# Patient Record
Sex: Female | Born: 1940 | Race: White | Hispanic: No | Marital: Married | Smoking: Never smoker
Health system: Southern US, Community
[De-identification: ages and names within clinical notes are randomized; demographics above are authoritative.]

## PROBLEM LIST (undated history)

## (undated) DIAGNOSIS — L509 Urticaria, unspecified: Secondary | ICD-10-CM

## (undated) DIAGNOSIS — I2510 Atherosclerotic heart disease of native coronary artery without angina pectoris: Secondary | ICD-10-CM

## (undated) DIAGNOSIS — E039 Hypothyroidism, unspecified: Secondary | ICD-10-CM

## (undated) DIAGNOSIS — G2581 Restless legs syndrome: Secondary | ICD-10-CM

## (undated) DIAGNOSIS — J849 Interstitial pulmonary disease, unspecified: Secondary | ICD-10-CM

## (undated) DIAGNOSIS — E785 Hyperlipidemia, unspecified: Secondary | ICD-10-CM

## (undated) DIAGNOSIS — K219 Gastro-esophageal reflux disease without esophagitis: Secondary | ICD-10-CM

## (undated) DIAGNOSIS — L661 Lichen planopilaris, unspecified: Secondary | ICD-10-CM

## (undated) DIAGNOSIS — R7303 Prediabetes: Secondary | ICD-10-CM

## (undated) DIAGNOSIS — I341 Nonrheumatic mitral (valve) prolapse: Secondary | ICD-10-CM

## (undated) DIAGNOSIS — M1990 Unspecified osteoarthritis, unspecified site: Secondary | ICD-10-CM

## (undated) DIAGNOSIS — K449 Diaphragmatic hernia without obstruction or gangrene: Secondary | ICD-10-CM

## (undated) DIAGNOSIS — I272 Pulmonary hypertension, unspecified: Secondary | ICD-10-CM

## (undated) HISTORY — DX: Interstitial pulmonary disease, unspecified: J84.9

## (undated) HISTORY — DX: Urticaria, unspecified: L50.9

## (undated) HISTORY — DX: Gastro-esophageal reflux disease without esophagitis: K21.9

## (undated) HISTORY — DX: Lichen planopilaris: L66.1

## (undated) HISTORY — DX: Lichen planopilaris, unspecified: L66.10

## (undated) HISTORY — DX: Nonrheumatic mitral (valve) prolapse: I34.1

## (undated) HISTORY — DX: Restless legs syndrome: G25.81

## (undated) HISTORY — PX: TOTAL ABDOMINAL HYSTERECTOMY: SHX209

## (undated) HISTORY — DX: Diaphragmatic hernia without obstruction or gangrene: K44.9

## (undated) HISTORY — DX: Hypothyroidism, unspecified: E03.9

## (undated) HISTORY — DX: Hyperlipidemia, unspecified: E78.5

## (undated) HISTORY — PX: KNEE ARTHROSCOPY: SUR90

## (undated) HISTORY — PX: APPENDECTOMY: SHX54

---

## 1987-01-23 HISTORY — PX: CARDIAC CATHETERIZATION: SHX172

## 1997-01-22 HISTORY — PX: FLEXIBLE SIGMOIDOSCOPY: SHX1649

## 1999-11-30 ENCOUNTER — Encounter: Payer: Self-pay | Admitting: Obstetrics and Gynecology

## 1999-11-30 ENCOUNTER — Encounter: Admission: RE | Admit: 1999-11-30 | Discharge: 1999-11-30 | Payer: Self-pay | Admitting: Obstetrics and Gynecology

## 1999-12-22 ENCOUNTER — Encounter: Admission: RE | Admit: 1999-12-22 | Discharge: 1999-12-22 | Payer: Self-pay | Admitting: Obstetrics and Gynecology

## 1999-12-22 ENCOUNTER — Encounter: Payer: Self-pay | Admitting: Obstetrics and Gynecology

## 2001-01-20 ENCOUNTER — Encounter: Admission: RE | Admit: 2001-01-20 | Discharge: 2001-01-20 | Payer: Self-pay | Admitting: Internal Medicine

## 2001-01-20 ENCOUNTER — Encounter: Payer: Self-pay | Admitting: Internal Medicine

## 2002-01-21 ENCOUNTER — Encounter: Payer: Self-pay | Admitting: Obstetrics and Gynecology

## 2002-01-21 ENCOUNTER — Encounter: Admission: RE | Admit: 2002-01-21 | Discharge: 2002-01-21 | Payer: Self-pay | Admitting: Obstetrics and Gynecology

## 2003-01-23 HISTORY — PX: COLONOSCOPY: SHX174

## 2003-05-04 ENCOUNTER — Ambulatory Visit (HOSPITAL_COMMUNITY): Admission: RE | Admit: 2003-05-04 | Discharge: 2003-05-04 | Payer: Self-pay | Admitting: Internal Medicine

## 2003-05-04 ENCOUNTER — Encounter: Payer: Self-pay | Admitting: Internal Medicine

## 2003-05-04 LAB — HM COLONOSCOPY

## 2003-12-02 ENCOUNTER — Ambulatory Visit (HOSPITAL_BASED_OUTPATIENT_CLINIC_OR_DEPARTMENT_OTHER): Admission: RE | Admit: 2003-12-02 | Discharge: 2003-12-02 | Payer: Self-pay | Admitting: Orthopedic Surgery

## 2004-05-01 ENCOUNTER — Ambulatory Visit: Payer: Self-pay | Admitting: Internal Medicine

## 2004-09-11 ENCOUNTER — Encounter: Admission: RE | Admit: 2004-09-11 | Discharge: 2004-09-11 | Payer: Self-pay | Admitting: Obstetrics and Gynecology

## 2004-09-19 ENCOUNTER — Ambulatory Visit: Payer: Self-pay | Admitting: Internal Medicine

## 2004-10-12 ENCOUNTER — Ambulatory Visit: Payer: Self-pay | Admitting: Internal Medicine

## 2004-10-31 ENCOUNTER — Ambulatory Visit: Payer: Self-pay | Admitting: Internal Medicine

## 2005-01-10 ENCOUNTER — Ambulatory Visit: Payer: Self-pay | Admitting: Internal Medicine

## 2005-05-29 ENCOUNTER — Ambulatory Visit: Payer: Self-pay | Admitting: Internal Medicine

## 2005-07-16 ENCOUNTER — Ambulatory Visit: Payer: Self-pay | Admitting: Internal Medicine

## 2006-01-08 ENCOUNTER — Ambulatory Visit: Payer: Self-pay | Admitting: Internal Medicine

## 2006-01-23 ENCOUNTER — Encounter: Admission: RE | Admit: 2006-01-23 | Discharge: 2006-01-23 | Payer: Self-pay | Admitting: Obstetrics and Gynecology

## 2006-04-10 ENCOUNTER — Ambulatory Visit: Payer: Self-pay | Admitting: Internal Medicine

## 2006-04-10 LAB — CONVERTED CEMR LAB
ALT: 15 units/L (ref 0–40)
Glucose, Bld: 92 mg/dL (ref 70–99)
Triglycerides: 102 mg/dL (ref 0–149)
VLDL: 20 mg/dL (ref 0–40)

## 2006-07-02 ENCOUNTER — Encounter: Payer: Self-pay | Admitting: Internal Medicine

## 2006-07-02 ENCOUNTER — Encounter: Admission: RE | Admit: 2006-07-02 | Discharge: 2006-07-02 | Payer: Self-pay | Admitting: Obstetrics and Gynecology

## 2006-07-10 ENCOUNTER — Ambulatory Visit: Payer: Self-pay | Admitting: Internal Medicine

## 2007-01-21 ENCOUNTER — Ambulatory Visit: Payer: Self-pay | Admitting: Cardiology

## 2007-01-21 ENCOUNTER — Telehealth: Payer: Self-pay | Admitting: Internal Medicine

## 2007-01-21 ENCOUNTER — Observation Stay (HOSPITAL_COMMUNITY): Admission: EM | Admit: 2007-01-21 | Discharge: 2007-01-22 | Payer: Self-pay | Admitting: Emergency Medicine

## 2007-01-29 ENCOUNTER — Telehealth (INDEPENDENT_AMBULATORY_CARE_PROVIDER_SITE_OTHER): Payer: Self-pay | Admitting: *Deleted

## 2007-01-29 ENCOUNTER — Encounter: Payer: Self-pay | Admitting: Internal Medicine

## 2007-01-30 ENCOUNTER — Ambulatory Visit: Payer: Self-pay

## 2007-01-30 ENCOUNTER — Encounter: Payer: Self-pay | Admitting: Internal Medicine

## 2007-01-31 ENCOUNTER — Telehealth (INDEPENDENT_AMBULATORY_CARE_PROVIDER_SITE_OTHER): Payer: Self-pay | Admitting: *Deleted

## 2007-02-03 ENCOUNTER — Telehealth (INDEPENDENT_AMBULATORY_CARE_PROVIDER_SITE_OTHER): Payer: Self-pay | Admitting: *Deleted

## 2007-02-07 ENCOUNTER — Ambulatory Visit: Payer: Self-pay | Admitting: Internal Medicine

## 2007-02-07 DIAGNOSIS — E039 Hypothyroidism, unspecified: Secondary | ICD-10-CM | POA: Insufficient documentation

## 2007-02-07 DIAGNOSIS — E782 Mixed hyperlipidemia: Secondary | ICD-10-CM | POA: Insufficient documentation

## 2007-02-12 ENCOUNTER — Ambulatory Visit: Payer: Self-pay

## 2007-02-12 ENCOUNTER — Encounter: Admission: RE | Admit: 2007-02-12 | Discharge: 2007-02-12 | Payer: Self-pay | Admitting: Obstetrics and Gynecology

## 2007-02-17 ENCOUNTER — Ambulatory Visit: Payer: Self-pay | Admitting: Internal Medicine

## 2007-02-17 ENCOUNTER — Encounter (INDEPENDENT_AMBULATORY_CARE_PROVIDER_SITE_OTHER): Payer: Self-pay | Admitting: *Deleted

## 2007-06-19 ENCOUNTER — Ambulatory Visit: Payer: Self-pay | Admitting: Internal Medicine

## 2007-06-19 DIAGNOSIS — J309 Allergic rhinitis, unspecified: Secondary | ICD-10-CM | POA: Insufficient documentation

## 2007-06-19 DIAGNOSIS — K219 Gastro-esophageal reflux disease without esophagitis: Secondary | ICD-10-CM | POA: Insufficient documentation

## 2007-06-20 ENCOUNTER — Encounter (INDEPENDENT_AMBULATORY_CARE_PROVIDER_SITE_OTHER): Payer: Self-pay | Admitting: *Deleted

## 2007-06-22 LAB — CONVERTED CEMR LAB
Albumin: 4.1 g/dL (ref 3.5–5.2)
Alkaline Phosphatase: 55 units/L (ref 39–117)
Bilirubin, Direct: 0.1 mg/dL (ref 0.0–0.3)
Cholesterol: 141 mg/dL (ref 0–200)
Lymphocytes Relative: 31.8 % (ref 12.0–46.0)
Monocytes Absolute: 0.6 10*3/uL (ref 0.1–1.0)
Neutro Abs: 3.5 10*3/uL (ref 1.4–7.7)
Platelets: 210 10*3/uL (ref 150–400)
RBC: 4.52 M/uL (ref 3.87–5.11)
TSH: 0.28 microintl units/mL — ABNORMAL LOW (ref 0.35–5.50)
Total Bilirubin: 0.9 mg/dL (ref 0.3–1.2)
Total Protein: 7 g/dL (ref 6.0–8.3)
Triglycerides: 109 mg/dL (ref 0–149)
VLDL: 22 mg/dL (ref 0–40)

## 2007-06-23 ENCOUNTER — Encounter (INDEPENDENT_AMBULATORY_CARE_PROVIDER_SITE_OTHER): Payer: Self-pay | Admitting: *Deleted

## 2007-07-10 ENCOUNTER — Ambulatory Visit: Payer: Self-pay | Admitting: Internal Medicine

## 2007-07-10 LAB — CONVERTED CEMR LAB: OCCULT 2: NEGATIVE

## 2007-07-11 ENCOUNTER — Encounter (INDEPENDENT_AMBULATORY_CARE_PROVIDER_SITE_OTHER): Payer: Self-pay | Admitting: *Deleted

## 2007-07-31 DIAGNOSIS — K449 Diaphragmatic hernia without obstruction or gangrene: Secondary | ICD-10-CM | POA: Insufficient documentation

## 2007-07-31 DIAGNOSIS — Z8679 Personal history of other diseases of the circulatory system: Secondary | ICD-10-CM | POA: Insufficient documentation

## 2007-08-04 ENCOUNTER — Ambulatory Visit: Payer: Self-pay | Admitting: Internal Medicine

## 2007-08-15 ENCOUNTER — Encounter: Payer: Self-pay | Admitting: Internal Medicine

## 2007-08-15 ENCOUNTER — Ambulatory Visit: Payer: Self-pay | Admitting: Internal Medicine

## 2007-08-19 ENCOUNTER — Encounter: Payer: Self-pay | Admitting: Internal Medicine

## 2007-10-09 ENCOUNTER — Telehealth (INDEPENDENT_AMBULATORY_CARE_PROVIDER_SITE_OTHER): Payer: Self-pay | Admitting: *Deleted

## 2007-11-07 ENCOUNTER — Ambulatory Visit: Payer: Self-pay | Admitting: Internal Medicine

## 2007-11-15 LAB — CONVERTED CEMR LAB: TSH: 0.38 microintl units/mL (ref 0.35–5.50)

## 2007-11-18 ENCOUNTER — Encounter (INDEPENDENT_AMBULATORY_CARE_PROVIDER_SITE_OTHER): Payer: Self-pay | Admitting: *Deleted

## 2007-11-19 ENCOUNTER — Telehealth (INDEPENDENT_AMBULATORY_CARE_PROVIDER_SITE_OTHER): Payer: Self-pay | Admitting: *Deleted

## 2008-03-02 ENCOUNTER — Encounter: Admission: RE | Admit: 2008-03-02 | Discharge: 2008-03-02 | Payer: Self-pay | Admitting: Obstetrics and Gynecology

## 2008-03-04 ENCOUNTER — Encounter: Admission: RE | Admit: 2008-03-04 | Discharge: 2008-03-04 | Payer: Self-pay | Admitting: Obstetrics and Gynecology

## 2008-03-26 ENCOUNTER — Ambulatory Visit: Payer: Self-pay | Admitting: Internal Medicine

## 2008-03-29 ENCOUNTER — Encounter (INDEPENDENT_AMBULATORY_CARE_PROVIDER_SITE_OTHER): Payer: Self-pay | Admitting: *Deleted

## 2008-07-08 ENCOUNTER — Ambulatory Visit: Payer: Self-pay | Admitting: Family Medicine

## 2008-07-08 LAB — CONVERTED CEMR LAB
Bilirubin Urine: NEGATIVE
Ketones, urine, test strip: NEGATIVE
Nitrite: NEGATIVE
Urobilinogen, UA: NEGATIVE
WBC Urine, dipstick: NEGATIVE

## 2008-07-10 ENCOUNTER — Telehealth: Payer: Self-pay | Admitting: Family Medicine

## 2008-09-01 ENCOUNTER — Ambulatory Visit: Payer: Self-pay | Admitting: Internal Medicine

## 2008-09-01 DIAGNOSIS — M2550 Pain in unspecified joint: Secondary | ICD-10-CM | POA: Insufficient documentation

## 2008-09-01 DIAGNOSIS — N959 Unspecified menopausal and perimenopausal disorder: Secondary | ICD-10-CM | POA: Insufficient documentation

## 2008-09-02 ENCOUNTER — Telehealth (INDEPENDENT_AMBULATORY_CARE_PROVIDER_SITE_OTHER): Payer: Self-pay | Admitting: *Deleted

## 2008-09-03 ENCOUNTER — Encounter (INDEPENDENT_AMBULATORY_CARE_PROVIDER_SITE_OTHER): Payer: Self-pay | Admitting: *Deleted

## 2008-09-15 ENCOUNTER — Encounter: Payer: Self-pay | Admitting: Internal Medicine

## 2008-09-15 ENCOUNTER — Encounter: Admission: RE | Admit: 2008-09-15 | Discharge: 2008-09-15 | Payer: Self-pay | Admitting: Internal Medicine

## 2008-09-17 ENCOUNTER — Encounter (INDEPENDENT_AMBULATORY_CARE_PROVIDER_SITE_OTHER): Payer: Self-pay | Admitting: *Deleted

## 2008-10-20 ENCOUNTER — Ambulatory Visit: Payer: Self-pay | Admitting: Internal Medicine

## 2008-10-24 LAB — CONVERTED CEMR LAB
Basophils Absolute: 0 10*3/uL (ref 0.0–0.1)
Eosinophils Absolute: 0.3 10*3/uL (ref 0.0–0.7)
Eosinophils Relative: 5.5 % — ABNORMAL HIGH (ref 0.0–5.0)
Folate: 15.7 ng/mL
Iron: 56 ug/dL (ref 42–145)
Lymphs Abs: 1.6 10*3/uL (ref 0.7–4.0)
MCHC: 34 g/dL (ref 30.0–36.0)
MCV: 90.9 fL (ref 78.0–100.0)
Monocytes Relative: 9.4 % (ref 3.0–12.0)
Neutrophils Relative %: 53.1 % (ref 43.0–77.0)
Saturation Ratios: 17.3 % — ABNORMAL LOW (ref 20.0–50.0)
Transferrin: 230.7 mg/dL (ref 212.0–360.0)
Vitamin B-12: 334 pg/mL (ref 211–911)
WBC: 5.1 10*3/uL (ref 4.5–10.5)

## 2008-10-25 ENCOUNTER — Encounter (INDEPENDENT_AMBULATORY_CARE_PROVIDER_SITE_OTHER): Payer: Self-pay | Admitting: *Deleted

## 2009-03-08 ENCOUNTER — Encounter: Admission: RE | Admit: 2009-03-08 | Discharge: 2009-03-08 | Payer: Self-pay | Admitting: Obstetrics and Gynecology

## 2009-05-10 ENCOUNTER — Telehealth (INDEPENDENT_AMBULATORY_CARE_PROVIDER_SITE_OTHER): Payer: Self-pay | Admitting: *Deleted

## 2009-11-02 ENCOUNTER — Ambulatory Visit: Payer: Self-pay | Admitting: Internal Medicine

## 2009-11-04 ENCOUNTER — Encounter: Payer: Self-pay | Admitting: Internal Medicine

## 2009-11-07 LAB — CONVERTED CEMR LAB
ALT: 15 units/L (ref 0–35)
Alkaline Phosphatase: 61 units/L (ref 39–117)
BUN: 14 mg/dL (ref 6–23)
Basophils Relative: 0.5 % (ref 0.0–3.0)
CO2: 29 meq/L (ref 19–32)
Chloride: 106 meq/L (ref 96–112)
Creatinine, Ser: 0.8 mg/dL (ref 0.4–1.2)
Hemoglobin: 12.3 g/dL (ref 12.0–15.0)
Lymphocytes Relative: 29.7 % (ref 12.0–46.0)
MCHC: 33.5 g/dL (ref 30.0–36.0)
Monocytes Absolute: 0.6 10*3/uL (ref 0.1–1.0)
Potassium: 4.7 meq/L (ref 3.5–5.1)
Total CHOL/HDL Ratio: 3
Total Protein: 6.7 g/dL (ref 6.0–8.3)
Triglycerides: 126 mg/dL (ref 0.0–149.0)
VLDL: 25.2 mg/dL (ref 0.0–40.0)

## 2010-02-11 ENCOUNTER — Other Ambulatory Visit: Payer: Self-pay | Admitting: Obstetrics and Gynecology

## 2010-02-11 DIAGNOSIS — Z1239 Encounter for other screening for malignant neoplasm of breast: Secondary | ICD-10-CM

## 2010-02-12 ENCOUNTER — Encounter: Payer: Self-pay | Admitting: Obstetrics and Gynecology

## 2010-02-19 LAB — CONVERTED CEMR LAB
BUN: 16 mg/dL (ref 6–23)
Creatinine, Ser: 0.7 mg/dL (ref 0.4–1.2)
Eosinophils Absolute: 0.7 10*3/uL (ref 0.0–0.7)
GFR calc non Af Amer: 88.62 mL/min (ref >60)
Glucose, Bld: 86 mg/dL (ref 70–99)
LDL Cholesterol: 72 mg/dL (ref 0–99)
MCV: 90.3 fL (ref 78.0–100.0)
Monocytes Relative: 9.3 % (ref 3.0–12.0)
Neutrophils Relative %: 48.9 % (ref 43.0–77.0)
RDW: 13.3 % (ref 11.5–14.6)
Total Bilirubin: 1 mg/dL (ref 0.3–1.2)
Total CHOL/HDL Ratio: 3
Total Protein: 6.7 g/dL (ref 6.0–8.3)
Triglycerides: 82 mg/dL (ref 0.0–149.0)
VLDL: 16.4 mg/dL (ref 0.0–40.0)
Vit D, 25-Hydroxy: 34 ng/mL (ref 30–89)
WBC: 6.9 10*3/uL (ref 4.5–10.5)

## 2010-02-21 NOTE — Progress Notes (Signed)
Summary: Refill Meds   Phone Note Refill Request Message from:  Patient  Refills Requested: Medication #1:  NEXIUM 40 MG  CPDR 1 qd [facility] [address]   Method Requested: Fax to [facility] Initial call taken by: Georgette Dover,  May 10, 2009 12:01 PM    Prescriptions: NEXIUM 40 MG  CPDR (ESOMEPRAZOLE MAGNESIUM) 1 qd  #90 x 2   Entered by:   Georgette Dover   Authorized by:   Unice Cobble MD   Signed by:   Georgette Dover on 05/10/2009   Method used:   Electronically to        [facility]  [phone]* (retail)       [place], [place]  [address]       Ph: [phone] or [phone]       Fax: [phone]   RxID:   [id number]

## 2010-02-21 NOTE — Assessment & Plan Note (Signed)
Summary: MED REFILL/KN   Vital Signs:  Patient profile:   70 year old female Height:      65.75 inches (167.01 cm) Weight:      146.38 pounds (66.54 kg) BMI:     23.89 Temp:     97.9 degrees F (36.61 degrees C) oral Resp:     15 per minute BP sitting:   110 / 76  (left arm) Cuff size:   regular  Vitals Entered By: Ernestene Mention CMA (November 02, 2009 12:55 PM) CC: Med refill (all)/kb, Heartburn, Lipid Management Is Patient Diabetic? No Pain Assessment Patient in pain? no        Primary Care Provider:  Unice Cobble MD  CC:  Med refill (all)/kb, Heartburn, and Lipid Management.  History of Present Illness: Here for Medicare AWV & F/U of ERD, Hypothyroidism, Dyslipidemia( chart updated) 1.Risk factors based on Past M, S, F history:see dignoses above 2.Physical Activities: CVE as yoga X 2 & adult fitness 2X / week or > 3.Depression/mood: no issues 4.Hearing: decreased hearing to whisper R ear @ 6 ft 5.ADL's: no limitations 6.Fall Risk: no imbalance or co-ordination issues 7.Home Safety:  home safety proofed; smoke detectors in home 8.Height, weight, &visual acuity:wall chart read @ 6 ft with lenses 9.Counseling: none requested; unsure as to [place] 10.Labs ordered based on risk factors: see Orders 11. Referral Coordination: none requested 12. Care Plan: see Instructions 13. Cognitive Assessment:Oriented X3; memory & recall  intact  ;"WORLD" spelled backwards; mood & affect normal. Hyperlipidemia Follow-Up:      This is a 70 year old woman who presents for Hyperlipidemia follow-up.  The patient denies muscle aches, GI upset, abdominal pain, flushing, itching, constipation, diarrhea, and fatigue.  The patient denies the following symptoms: chest pain/pressure, exercise intolerance, dypsnea, palpitations, syncope, and pedal edema.  Compliance with medications (by patient report) has been near 100%.  Dietary compliance has been good.   ERD F/U :       The patient  reports weight loss of 16 # with Weight [employer]  but denies acid reflux, sour taste in mouth, epigastric pain, and trouble swallowing.  The patient denies the following alarm features: melena, dysphagia, hematemesis, and vomiting.  The patient has found the following treatments to be effective: weight loss and a PPI( Nexium as needed ).   Lipid Management History:      Positive NCEP/ATP III risk factors include female age 69 years old or older.  Negative NCEP/ATP III risk factors include non-diabetic, no family history for ischemic heart disease, non-tobacco-user status, non-hypertensive, no ASHD (atherosclerotic heart disease), no prior stroke/TIA, no peripheral vascular disease, and no history of aortic aneurysm.     Preventive Screening-Counseling & Management  Alcohol-Tobacco     Alcohol drinks/day: rare     Smoking Status: never  Caffeine-Diet-Exercise     Caffeine use/day: decaf only  Hep-HIV-STD-Contraception     Dental Visit-last 6 months yes     Sun Exposure-Excessive: no  Safety-Violence-Falls     Seat Belt Use: yes      Blood Transfusions:  prior to 1987 and transfusion post [place].        Travel History:  remote foreign travel only.    Current Medications (verified): 1)  Vytorin 10-20 Mg  Tabs (Ezetimibe-Simvastatin) .Marland Kitchen.. 1 By Mouth Qhs 2)  Allegra 180 Mg  Tabs (Fexofenadine Hcl) .Marland Kitchen.. 1 By Mouth Qd 3)  Unithroid Direct 100 Mcg  Tabs (Levothyroxine Sodium) .Marland Kitchen.. 1 By Mouth Once  Daily Except 1/2 Pill On Tues & Thurs 4)  Nexium 40 Mg  Cpdr (Esomeprazole Magnesium) .Marland Kitchen.. 1 Qd 5)  Vitamin C 500 Mg  Tabs (Ascorbic Acid) .Marland Kitchen.. 1 Once Daily 6)  Organic Fish Oil .... 2 Teaspoons Two Times A Day 7)  Glucosamine .Marland Kitchen.. 1 By Mouth Once Daily As Needed 8)  Vitamin B-12 (Dosage Unknown) .Marland Kitchen.. 1 By Mouth Once Daily  Allergies (verified): 1)  ! * Ees 2)  ! * Caffine 3)  ! * Maxifed 4)  ! * Deconamine 5)  ! * Sinus Meds  Past History:  Past Medical  History: Hypothyroidism GERD hiatal hernia extrinsic rhinoconjunctivitis fissure in ano, PMH of  MVP IRBBB glaucoma suspect, Dr Katy Fitch Hyperlipidemia: Framingham Study LDL goal = < 160. NMR Lipoprofile 2006:LDL 141(2111/1459), HDL 44, TG 135.Marland KitchenLDL goal = < 100.  Past Surgical History: Hysterectomy for endometriosis with  incidental  Appendectomy chest pain 1989 with negative catheterization gravid 2 para 2 flex sigmoid ; knee arthroscopy bilaterally colonoscopy 2005 negative, due 2015; Endo 2009: esophagitis, Dr Olevia Perches  Family History: father: lung cancer, mesothelioma, asbestosis, worked with brake linings mother: cerebral hemorrhage @  age 66 paternal grandmother : heart disease, diabetes, arthritis brother: diabetes, MVP, cva times two , elevated lipids, htn paternal aunt: uterine cancer brother: MI  @ age 81  brother: Barrett's esophagus  Social History: Never Smoked Alcohol use-rarely Married Patient gets regular exerciseCaffeine use/day:  decaf only Dental Care w/in 6 mos.:  yes Sun Exposure-Excessive:  no Seat Belt Use:  yes Blood Transfusions:  prior to 1987, transfusion post [facility] 1976  Review of Systems General:  Complains of sweats; denies fever; Hot flashes ; HRT D/Ced 2009. Eyes:  Denies blurring, double vision, and vision loss-both eyes. ENT:  Denies hoarseness. Resp:  Denies cough and sputum productive. GU:  Denies decreased libido, discharge, and hematuria. MS:  Complains of joint pain; denies joint redness and joint swelling; Pain R 1st & 2nd toes; Podiatrist seen. Derm:  Complains of dryness; denies changes in nail beds and hair loss. Neuro:  Denies numbness and tingling. Endo:  Denies cold intolerance, excessive hunger, excessive thirst, excessive urination, and heat intolerance. Heme:  Denies abnormal bruising and bleeding. Allergy:  Complains of itching eyes, seasonal allergies, and sneezing; Fexofenadine & Neti pot  as needed . Marland Kitchen  Physical  Exam  General:  Well-developed,well-nourished,in no acute distress; alert,appropriate and cooperative throughout examination Head:  Normocephalic and atraumatic without obvious abnormalities.Hair dry Eyes:  No corneal or conjunctival inflammation noted.Perrla. Funduscopic exam benign, without hemorrhages, exudates or papilledema. Ears:  External ear exam shows no significant lesions or deformities.  Otoscopic examination reveals clear canals, tympanic membranes are intact bilaterally without bulging, retraction, inflammation or discharge. Hearing is grossly normal bilaterally. Nose:  External nasal examination shows no deformity or inflammation. Nasal mucosa are pink and moist without lesions or exudates. Mouth:  Oral mucosa and oropharynx without lesions or exudates.  Teeth in good repair. Neck:  No deformities, masses, or tenderness noted. Lungs:  Normal respiratory effort, chest expands symmetrically. Lungs are clear to auscultation, no crackles or wheezes. Heart:  Normal rate and regular rhythm. S1 and S2 normal without gallop, murmur, click, rub.S4  Abdomen:  Bowel sounds positive,abdomen soft and non-tender without masses, organomegaly or hernias noted. Genitalia:  Dr Willis Modena, Gyn Msk:  No deformity or scoliosis noted of thoracic or lumbar spine.   Pulses:  R and L carotid,radial,dorsalis pedis and posterior tibial pulses are full and equal bilaterally Extremities:  No clubbing, cyanosis, edema, or deformity noted with normal full range of motion of all joints.   Neurologic:  alert & oriented X3 and DTRs symmetrical and normal.   Skin:  Intact without suspicious lesions or rashes Cervical Nodes:  No lymphadenopathy noted Axillary Nodes:  No palpable lymphadenopathy Psych:  memory intact for recent and remote, normally interactive, and good eye contact.     Impression & Recommendations:  Problem # 1:  PREVENTIVE HEALTH CARE (ICD-V70.0)  Orders: Welcome to Medicare, Physical  [phone])  Problem # 2:  HYPERLIPIDEMIA (ICD-272.2)  Her updated medication list for this problem includes:    Vytorin 10-20 Mg Tabs (Ezetimibe-simvastatin) .Marland Kitchen... 1 by mouth qhs  Orders: Venipuncture (04888) TLB-Lipid Panel (80061-LIPID) TLB-BMP (Basic Metabolic Panel-BMET) (91694-HWTUUEK) TLB-Hepatic/Liver Function Pnl (80076-HEPATIC) TLB-TSH (Thyroid Stimulating Hormone) (84443-TSH) EKG w/ Interpretation (93000) Specimen Handling (99000)  Problem # 3:  HYPOTHYROIDISM (ICD-244.9)  Her updated medication list for this problem includes:    Unithroid Direct 100 Mcg Tabs (Levothyroxine sodium) .Marland Kitchen... 1 by mouth once daily except 1/2 pill on tues & thurs  Orders: Venipuncture (80034) TLB-BMP (Basic Metabolic Panel-BMET) (91791-TAVWPVX) TLB-TSH (Thyroid Stimulating Hormone) (84443-TSH) Specimen Handling (99000)  Problem # 4:  GASTROESOPHAGEAL REFLUX DISEASE, CHRONIC (ICD-530.81)  controlled Her updated medication list for this problem includes:    Nexium 40 Mg Cpdr (Esomeprazole magnesium) .Marland Kitchen... 1 qd  Orders: Venipuncture (48016) TLB-CBC Platelet - w/Differential (85025-CBCD) Specimen Handling (99000)  Complete Medication List: 1)  Vytorin 10-20 Mg Tabs (Ezetimibe-simvastatin) .Marland Kitchen.. 1 by mouth qhs 2)  Unithroid Direct 100 Mcg Tabs (Levothyroxine sodium) .Marland Kitchen.. 1 by mouth once daily except 1/2 pill on tues & thurs 3)  Nexium 40 Mg Cpdr (Esomeprazole magnesium) .Marland Kitchen.. 1 qd 4)  Vitamin C 500 Mg Tabs (Ascorbic acid) .Marland Kitchen.. 1 once daily 5)  Organic Fish Oil  .... 2 teaspoons two times a day 6)  Glucosamine  .Marland KitchenMarland Kitchen. 1 by mouth once daily as needed 7)  Vitamin B-12 (dosage Unknown)  .Marland Kitchen.. 1 by mouth once daily 8)  Loratadine 10 Mg Tabs (Loratadine) .Marland Kitchen.. 1 once daily as needed for allergies  Lipid Assessment/Plan:      Based on NCEP/ATP III, the patient's risk factor category is "0-1 risk factors".  The patient's lipid goals are as follows: Total cholesterol goal is 200; LDL cholesterol goal is  160; HDL cholesterol goal is 40; Triglyceride goal is 150.  Her LDL cholesterol goal has been met.    Patient Instructions: 1)  Verify POA & Living Will  status. 2)  Take an  81 mg coated Aspirin every day with food. 3)  Avoid foods high in acid (tomatoes, citrus juices, spicy foods). Avoid eating within two hours of lying down or before exercising. Do not over eat; try smaller more frequent meals. Elevate head of bed twelve inches when sleeping. Prescriptions: LORATADINE 10 MG TABS (LORATADINE) 1 once daily as needed for allergies  #90 x 3   Entered and Authorized by:   Unice Cobble MD   Signed by:   Unice Cobble MD on 11/02/2009   Method used:   Print then Give to Patient   RxID:   [id number] UNITHROID DIRECT 100 MCG  TABS (LEVOTHYROXINE SODIUM) 1 by mouth once daily EXCEPT 1/2 pill on Tues & Thurs  #90 x 3   Entered and Authorized by:   Unice Cobble MD   Signed by:   Unice Cobble MD on 11/02/2009   Method used:   Print then Give to Patient  RxID:   [id number] VYTORIN 10-20 MG  TABS (EZETIMIBE-SIMVASTATIN) 1 by mouth qhs  #90 x 3   Entered and Authorized by:   Unice Cobble MD   Signed by:   Unice Cobble MD on 11/02/2009   Method used:   Print then Give to Patient   RxID:   [id number]    Immunization History:  Influenza Immunization History:    Influenza:  historical (10/30/2009)

## 2010-03-09 ENCOUNTER — Ambulatory Visit
Admission: RE | Admit: 2010-03-09 | Discharge: 2010-03-09 | Disposition: A | Discharge status: Home / Self Care | Payer: Medicare Other | Source: Ambulatory Visit | Attending: Obstetrics and Gynecology | Admitting: Obstetrics and Gynecology

## 2010-03-09 ENCOUNTER — Encounter: Payer: Self-pay | Admitting: Internal Medicine

## 2010-03-09 DIAGNOSIS — Z1239 Encounter for other screening for malignant neoplasm of breast: Secondary | ICD-10-CM

## 2010-05-08 ENCOUNTER — Other Ambulatory Visit: Payer: Self-pay | Admitting: Internal Medicine

## 2010-06-06 NOTE — Discharge Summary (Signed)
NAMEALYSON, Elizabeth Espinoza              ACCOUNT NO.:  [id number]   MEDICAL RECORD NO.:  [id number]          PATIENT TYPE:  INP   LOCATION:  2023                         FACILITY:  [facility]   PHYSICIAN:  Wallis Bamberg. Johnsie Cancel, MD, FACCDATE OF BIRTH:  Aug 09, 1940   DATE OF ADMISSION:  01/21/2007  DATE OF DISCHARGE:  01/22/2007                               DISCHARGE SUMMARY   PRIMARY CARDIOLOGIST:  Dr. Marijo Conception. Wall.   PRIMARY CARE PHYSICIAN:  Dr. Darrick Penna. Hopper.   PROCEDURES PERFORMED DURING HOSPITALIZATION:  None.   DISCHARGE DIAGNOSES:  1. Chest pain with atypical interval features.  Myocardial infarction      has been ruled out.  2. Hypercholesterolemia.  3. Hypothyroidism.  4. Gastroesophageal reflux disease.  5. Hiatal hernia.   HISTORY OF PRESENT ILLNESS:  This 70 year old Caucasian female with a  history of atypical chest pain, with a negative Myoview in 2005, who was  previously followed by Dr. Denice Bors. [name].  The patient began to have  some substernal chest pain rated at 7/10, radiating up to the throat and  neck, with pressure and radiation down the left arm to the palm.  The  pain had been constant since that morning between 3-7/10.  The patient  said it was not worsening with any exertion.  She went to [facility] today and was given nitroglycerin and the pain decreased to  3/10.   She came to the [facility] Emergency Room and was  placed on a nitroglycerin drip and given a GI cocktail, along with  morphine, and the patient appeared more comfortable and was pain-free.   HOSPITAL COURSE:  The patient was seen and examined by Dr. Ernestine Mcmurray  and Murray Hodgkins, A.N.P. in the emergency room and was admitted to  rule out a myocardial infarction.  She did have a strong family history  and some cardiovascular risk factors.  Cardiac enzymes were cycled and  found to be negative x3.  The patient's blood pressure was well-  controlled.   Cholesterol studies were completed, along with TSH.  Electrocardiogram was completed, revealing a sinus bradycardia with no  ischemic event.   On the following morning the patient was seen and examined by myself and  by Dr. Collier Salina C. Nishan.  The patient had some difficulty with chest  discomfort in the early morning hours, but it went away with one dose of  IV morphine.  She was placed on a nitroglycerin paste.  The heparin drip  was discontinued, as it was started in the emergency room on the day  prior.  The patient was seen by Dr. Johnsie Cancel and found to be stable for  discharge, as her cardiac enzymes were normal and the electrocardiogram  revealed nothing acute.   FOLLOWUP:  She was to follow up with Dr. Marcello Moores C. Wall as an  outpatient, and a stress Myoview is planned.   DISCHARGE LABORATORY DATA:  Hemoglobin 11.7, hematocrit 34.8, white  blood cells 6.6, platelets 185.  Sodium 139, potassium 4, chloride 106,  CO2 of 27, BUN 8, creatinine 0.7,  glucose 100.  Cholesterol 116,  triglycerides 105, LDL 44, HDL 51.  TSH 1.724.   Electrocardiogram revealed a sinus bradycardia, no ischemia seen.   DISCHARGE MEDICATIONS:  1. Protonix 40 mg daily.  2. Enteric-coated aspirin 81 mg daily.  3. Vytorin 10/10, one p.o. daily.  4. Levothyroxine 100 mcg daily.  5. Nitroglycerin 0.4 mg sublingual p.r.n.  6. Premarin as directed at home.   ALLERGIES:  PSEUDOEPHEDRINE.   FOLLOWUP:  1. The patient will follow up with a stress Myoview on January 30, 2007, at 9:45 a.m. at the [facility].  She has been advised to      not drink any caffeine, and be n.p.o. prior to the procedure.  2. The patient is to follow up with Dr. Mar Daring on February 12, 2007,      at 3 p.m. for continued cardiac management.  3. The patient is to follow up with Dr. Darrick Penna. Hopper on her own      accord for continued medical management.   Time spent with the patient, including the physician's time was 45   minutes.      Phill Myron. Purcell Nails, NP      Wallis Bamberg. Johnsie Cancel, MD, [facility]  Electronically Signed    KML/MEDQ  D:  01/22/2007  T:  01/22/2007  Job:  [id number]

## 2010-06-06 NOTE — H&P (Signed)
Elizabeth Espinoza, Elizabeth Espinoza              ACCOUNT NO.:  [id number]   MEDICAL RECORD NO.:  [id number]          PATIENT TYPE:  EMS   LOCATION:  MINO                         FACILITY:  [facility]   PHYSICIAN:  Ernestine Mcmurray, MD,FACC DATE OF BIRTH:  December 04, 1940   DATE OF ADMISSION:  01/21/2007  DATE OF DISCHARGE:                              HISTORY & PHYSICAL   PRIMARY CARDIOLOGIST:  Dr. Kirk Ruths.   PRIMARY CARE Rayvion Stumph:  Dr. Linna Darner.Marland Kitchen   PATIENT PROFILE:  A 70 year old Caucasian female with prior history of  atypical chest pain and negative Myoview in the past who presents with  about 36 hours worth of chest discomfort.   PROBLEM LIST:  1. Atypical chest pain.      a.     On July 15, 2003 exercise Myoview.  exercise time 8 minutes,       heart rate 145 beat per minute, 10 METS achieved.  EF 70%.  No       ischemia.  2. Questionable history of mitral valve prolapse.      a.     July 14, 2003 2-D echocardiogram normal LV function and wall       thickness, normal valves.  3. Hypothyroidism.  4. Gastroesophageal reflux disease.  5. Hiatal hernia.  6. Hyperlipidemia.  7. History of right medial meniscal tear status post medial      meniscectomy.   HISTORY OF PRESENT ILLNESS:  A 70 year old married Caucasian female with  history of atypical chest pain, status post negative Myoview in June  2005.  She was previously followed by Dr. Kirk Ruths.  She is very  active, exercising regularly without limitations.  Yesterday she awoke  with 7/10 upper sternal, throat and neck pressure with some radiation  down the left arm associated with fatigue.  Symptoms have been constant  since yesterday morning.  She rates her discomfort between 3 and 7/10.  Symptoms are not any worse with exertion.  She went to [facility] today because of persistence of 9/10 discomfort and was given  sublingual nitroglycerin with some reduction of discomfort to 3/10.  She  was then sent over to [facility]  ED.  She is currently on IV  nitroglycerin infusion.  She still complains of 3/10 chest pain but  otherwise appears comfortable.  She denies PND, orthopnea, dizziness,  syncope, edema or early satiety.   ALLERGIES:  DECONGESTANTS CAUSE TACHY PALPITATIONS.   HOME MEDICATIONS:  1. Vytorin 10/10 mg daily.  2. Zantac p.r.n.  3. Unithroid 100 mcg daily.   FAMILY HISTORY:  Mother died of cerebral hemorrhage at 69.  Father died  of cancer at 6.  She has a brother that died of an MI at 99, and  another brother who has had 2 or 3 CVAs and has a history of  hypertension, hyperlipidemia.   SOCIAL HISTORY:  She lives in [place] with her husband.  She is  retired.  She denies any tobacco or drug use.  She is very rarely has an  alcoholic beverage.  She works out at [employer] 3 days a  week using a  treadmill, elliptical and recently strength exercises.   REVIEW OF SYSTEMS:  Positive for chest pain and fatigue as outlined in  the HPI, otherwise all other systems reviewed negative.   PHYSICAL EXAMINATION:  VITAL SIGNS:  Temperature 98.1, heart rate 70,  respirations 20, blood pressure 111/58, pulse oximetry 99% on 2 liters.  GENERAL:  A pleasant white female in no acute distress.  Awake, alert  and oriented x3.  NECK:  No bruits JVD.  LUNGS:  Respirations regular and unlabored. Clear to auscultation.  CARDIAC:  Regular S1, S2, no S3, S4, murmurs.  ABDOMEN: Round, soft, nontender, nondistended.  Bowel sounds present x4.  EXTREMITIES:  Warm, dry and pink.  No clubbing, cyanosis or edema.  Dorsalis pedis, posterior tibial pulses 2+ bilaterally.   SENSORY CLINICAL FINDINGS:  Chest x-ray: Pending.  EKG:  Shows sinus  rhythm, normal axis, 72 beats per minute, no acute ST-T changes.  Hemoglobin 13.9, hematocrit 41.0.  Sodium 139, potassium 4.3, chloride  107, CO2 28.6, BUN 10, creatinine 0.8, glucose 87.  MB 1.6, troponin-I  less than 0.5.   ASSESSMENT/PLAN:  1. Chest pain typical and atypical  features.  Constant pressure times      approximately 36 hours now.  First set of markers are negative.      Plan to observe and add heparin, nitrate as well PPI.  Cycle      cardiac enzymes and if enzymes negative plan to see in the a.m.      with outpatient Myoview in our office.  2. Hyperlipidemia.  Continue Vytorin.  Check lipids and LFTs.  3. Hypothyroidism.  Check PFTs.  Continue Levothyroxine.  4. Gastroesophageal reflux disease.  Question contribution symptoms.      Will try GI cocktail here in the ED.  Add PPI.   Dictation ended at this point.      Murray Hodgkins, ANP      Ernestine Mcmurray, MD,FACC  Electronically Signed    CB/MEDQ  D:  01/21/2007  T:  01/22/2007  Job:  [id number]

## 2010-06-06 NOTE — Assessment & Plan Note (Signed)
[facility]                            CARDIOLOGY OFFICE NOTE   Elizabeth Espinoza, Elizabeth Espinoza                     MRN:          [id number]  DATE:02/12/2007                            DOB:          03-13-1940    [name] returns today after being discharged from the hospital with  chest pressure radiating up to her neck and throat, down her left arm  and hand.   Her risk factors are age, hyperlipidemia.   She also has gastroesophageal reflux and is going to probably have an  upper GI evaluation by Dr. Ignacia Espinoza.  She is on b.i.d. proton pump  inhibitor.   She ruled out for myocardial infarction.  She was discharged home.  Her  EKG was normal.  She had normal electrolytes, CBC. Her cholesterol was  116, triglycerides 105, HDL 51, LDL 44.  Thyroid function was also  tested in December and was negative.   She is still having little bit of problems with some pain in her throat  and neck particularly when she raises her voice a little bit.   CURRENT MEDICATIONS:  1. Omeprazole 20 mg p.o. b.i.d.  2. Vytorin 10/20 nightly.  3. Glucosamine chondroitin daily.  4. Vitamin C.  5. Calcium and vitamin D.  6. Allegra 180 daily.  7. Unithroid 100 mcg daily.  8. Premarin.   She looks remarkably younger than stated age.  Her blood pressure  120/76, her pulse 84 and regular.  Weight is 155.  HEENT:  Normocephalic, atraumatic.  PERRLA.  Extraocular movements are  intact.  Sclerae clear.  Face symmetry is normal.  Carotid upstrokes  were equal bilaterally without bruits, no JVD.  Thyroid is not enlarged.  Trachea is midline.  LUNGS:  Clear.  HEART:  Reveals a nondisplaced PMI.  She has normal S1-S2 without murmur  or gallop.  Abdominal exam is soft, good bowel sounds.  There is no epigastric  tenderness. No hepatomegaly.  EXTREMITIES:  No sinus clubbing or edema.  Pulses are intact.  NEURO:  Exam is intact.   ASSESSMENT/PLAN:  [name] he is still having  some residual  discomfort in her throat and neck. This may have been reflux or this  could be another cause of her discomfort.  I have encouraged to continue  with the omeprazole b.i.d. and follow with Dr. Linna Espinoza.   I will plan on seeing her back on a p.r.n. basis.   ADDENDUM:  She had a stress Myoview on January 30, 2007.  Exercise time  was 10 minutes.  She had no significant ST-segment changes.  She did  have some lateral neck pain and some chest heaviness.   Her EF was 74% with no sinus scar or ischemia.   I have discussed the findings with Elizabeth Espinoza.     Elizabeth C. Verl Blalock, MD, [facility]  Electronically Signed    TCW/MedQ  DD: 02/12/2007  DT: 02/12/2007  Job #: [id number]   cc:   Elizabeth Espinoza. Elizabeth Darner, MD,FACP,FCCP

## 2010-06-06 NOTE — Assessment & Plan Note (Signed)
[facility]                            CARDIOLOGY OFFICE NOTE   [name], NO                     MRN:          [id number]  DATE:02/12/2007                            DOB:          1940-11-07    ADDENDUM:  She had a stress Myoview on January 30, 2007.  Exercise time was 10  minutes.  She had no significant ST-segment changes.  She did have some  lateral neck pain and some chest heaviness.   Her EF was 74% with no sinus scar or ischemia.   I have discussed the findings with Elizabeth Espinoza.     Thomas C. Verl Blalock, MD, [facility]     TCW/MedQ  DD: 02/12/2007  DT: 02/12/2007  Job #: [id number]   cc:   Darrick Penna. Linna Darner, MD,FACP,FCCP

## 2010-06-09 NOTE — Op Note (Signed)
[name], Elizabeth Espinoza              ACCOUNT NO.:  [id number]   MEDICAL RECORD NO.:  [id number]          PATIENT TYPE:  AMB   LOCATION:  [place]                          FACILITY:  [facility]   PHYSICIAN:  Ninetta Lights, M.D. DATE OF BIRTH:  Jan 13, 1941   DATE OF PROCEDURE:  12/02/2003  DATE OF DISCHARGE:                                 OPERATIVE REPORT   PREOPERATIVE DIAGNOSIS:  Medial meniscal tear, right knee.   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   OPERATION PERFORMED:  Right knee examination under anesthesia, arthroscopy,  partial medial meniscectomy.  Chondroplasty all three compartments.   SURGEON:  Ninetta Lights, M.D.   ASSISTANT:  None.   ANESTHESIA:  Knee block with sedation.   SPECIMENS:  None.   CULTURES:  None.   COMPLICATIONS:  None.   DRESSING:  Soft compressive.   DESCRIPTION OF PROCEDURE:  The patient was brought to the operating room and  placed on the operating table in supine position.  After adequate anesthesia  had been obtained, the knee examined.  Full motion, good stability, positive  median McMurrays.  Leg holder applied.  Leg prepped and draped in the usual  sterile fashion.  Three portals were created, one superolateral, one each  medial and lateral parapatellar.  Inflow catheter introduced.  Knee  distended.  Arthroscope introduced, knee inspected.  Some mild grade two  changes, lateral patella debrided.  Reasonable tracking.  Reactive synovitis  throughout debrided.  Medial compartment extensive complex tearing medial  meniscus posterior half taken down to a stable rim, tapered in to remaining  meniscus.  Small focal area grade 3 chondromalacia juxtaposed to the  meniscus tear debrided.  Most of the cartilage in the compartment looked  good.  Cruciate ligaments intact.  Lateral meniscus intact  with some  softening of the plateau with some fissuring grade 2.  Sharp edges debrided.  At completion, all recess examined.  No other  findings appreciated.  Instruments and fluid removed.  Portals of the knee  injected with Marcaine.  Portals were closed with 4-0 nylon.  Sterile  compressive dressing applied.  Anesthesia reversed.  Brought to recovery  room.  Tolerated surgery well.  No complications.      Darden Dates   DFM/MEDQ  D:  12/02/2003  T:  12/02/2003  Job:  [id number]

## 2010-08-07 ENCOUNTER — Other Ambulatory Visit: Payer: Self-pay | Admitting: Internal Medicine

## 2010-08-17 ENCOUNTER — Ambulatory Visit (INDEPENDENT_AMBULATORY_CARE_PROVIDER_SITE_OTHER): Payer: Medicare Other | Admitting: Internal Medicine

## 2010-08-17 ENCOUNTER — Encounter: Payer: Self-pay | Admitting: Internal Medicine

## 2010-08-17 VITALS — BP 120/82 | HR 77 | Temp 98.8°F | Wt 147.0 lb

## 2010-08-17 DIAGNOSIS — J069 Acute upper respiratory infection, unspecified: Secondary | ICD-10-CM

## 2010-08-17 DIAGNOSIS — J029 Acute pharyngitis, unspecified: Secondary | ICD-10-CM

## 2010-08-17 MED ORDER — AMOXICILLIN 500 MG PO CAPS: 500.0000 mg | ORAL_CAPSULE | Freq: Three times a day (TID) | ORAL | Status: AC

## 2010-08-17 NOTE — Progress Notes (Signed)
  Subjective:    Patient ID: Elizabeth Espinoza, female    DOB: Jan 15, 1941, 70 y.o.   MRN: [id number]  HPI Respiratory tract infection Onset/symptoms:7/22 as ST Exposures (illness/environmental/extrinsic):no Progression of symptoms:initially improved but worse 7/25 Treatments/response:Tylenol w/o help Present symptoms:ST/ neck pain ; fatigue Fever/chills/sweats:nocturnal chills Frontal headache:yes Facial pain:yes Nasal purulence:no Dental pain:no Lymphadenopathy:no Wheezing/shortness of breath:no Cough/sputum/hemoptysis:no Associated extrinsic/allergic symptoms:itchy eyes/ sneezing:no  Smoking history:never           Review of Systems     Objective:   Physical Exam General appearance is of good health and nourishment; no acute distress or increased work of breathing is present.  No  lymphadenopathy about the head, neck, or axilla noted.   Eyes: No conjunctival inflammation or lid edema is present.   Ears:  External ear exam shows no significant lesions or deformities.  Otoscopic examination reveals clear canals, tympanic membranes are intact bilaterally without bulging, retraction, inflammation or discharge.  Nose:  External nasal examination shows no deformity or inflammation. Nasal mucosa are pink and moist without lesions or exudates. No septal dislocation or dislocation.No obstruction to airflow.   Oral exam: Dental hygiene is good; lips and gums are healthy appearing.There is no oropharyngeal erythema or exudate noted.   Neck:  No deformities, thyromegaly, or  masses but generalized  tenderness noted.   Supple with full range of motion without pain.   Heart:  Normal rate and regular rhythm. S1 and S2 normal without gallop, murmur, click, rub or other extra sounds.   Lungs:Chest clear to auscultation; no wheezes, rhonchi,rales ,or rubs present.No increased work of breathing.    Extremities:  No cyanosis, edema, or clubbing  noted    Skin: Warm & dry w/o jaundice  or tenting.          Assessment & Plan:  #1 pharyngitis in the context of upper respiratory tract infection  Plan: See orders and instructions.

## 2010-08-17 NOTE — Patient Instructions (Addendum)
Plain Mucinex for thick secretions ;force NON dairy fluids for next 48 hrs. Use a Neti pot daily as needed for sinus congestion Zicam Melts or Zinc lozenges ; vitamin C 2000 mg daily; & Echinacea for 4-7 days. Report fever, exudate("pus") or progressive pain.

## 2010-10-27 LAB — CBC
HCT: 34.8 — ABNORMAL LOW
Hemoglobin: 11.7 — ABNORMAL LOW
RDW: 13.4

## 2010-10-27 LAB — LIPID PANEL
Cholesterol: 116
HDL: 51
LDL Cholesterol: 44
Total CHOL/HDL Ratio: 2.3
Triglycerides: 105

## 2010-10-27 LAB — I-STAT 8, (EC8 V) (CONVERTED LAB)
BUN: 10
Bicarbonate: 28.6 — ABNORMAL HIGH
Chloride: 107
Glucose, Bld: 87
pCO2, Ven: 48.5
pH, Ven: 7.378 — ABNORMAL HIGH

## 2010-10-27 LAB — PROTIME-INR: INR: 1.1

## 2010-10-27 LAB — BASIC METABOLIC PANEL
BUN: 8
GFR calc non Af Amer: >60
Glucose, Bld: 100 — ABNORMAL HIGH
Potassium: 4

## 2010-10-27 LAB — POCT CARDIAC MARKERS
CKMB, poc: 1.6
CKMB, poc: <1 — ABNORMAL LOW
Myoglobin, poc: 55.9
Myoglobin, poc: 57.4
Operator id: 294501
Troponin i, poc: <0.05
Troponin i, poc: <0.05

## 2010-10-27 LAB — APTT: aPTT: 168 — ABNORMAL HIGH

## 2010-10-27 LAB — T4, FREE: Free T4: 1.16

## 2010-10-27 LAB — CARDIAC PANEL(CRET KIN+CKTOT+MB+TROPI): CK, MB: 2.2

## 2010-11-03 ENCOUNTER — Encounter: Payer: Medicare Other | Admitting: Internal Medicine

## 2010-11-07 ENCOUNTER — Ambulatory Visit (INDEPENDENT_AMBULATORY_CARE_PROVIDER_SITE_OTHER): Payer: Medicare Other | Admitting: Internal Medicine

## 2010-11-07 ENCOUNTER — Encounter: Payer: Self-pay | Admitting: Internal Medicine

## 2010-11-07 DIAGNOSIS — Z23 Encounter for immunization: Secondary | ICD-10-CM

## 2010-11-07 DIAGNOSIS — E039 Hypothyroidism, unspecified: Secondary | ICD-10-CM

## 2010-11-07 DIAGNOSIS — Z0000 Encounter for general adult medical examination without abnormal findings: Secondary | ICD-10-CM

## 2010-11-07 DIAGNOSIS — E782 Mixed hyperlipidemia: Secondary | ICD-10-CM

## 2010-11-07 DIAGNOSIS — E785 Hyperlipidemia, unspecified: Secondary | ICD-10-CM

## 2010-11-07 DIAGNOSIS — N959 Unspecified menopausal and perimenopausal disorder: Secondary | ICD-10-CM

## 2010-11-07 DIAGNOSIS — K219 Gastro-esophageal reflux disease without esophagitis: Secondary | ICD-10-CM

## 2010-11-07 LAB — HEPATIC FUNCTION PANEL
ALT: 19 U/L (ref 0–35)
AST: 29 U/L (ref 0–37)
Albumin: 4.6 g/dL (ref 3.5–5.2)

## 2010-11-07 LAB — LIPID PANEL
Cholesterol: 154 mg/dL (ref 0–200)
Total CHOL/HDL Ratio: 2.8 Ratio
Triglycerides: 83 mg/dL (ref <150)
VLDL: 17 mg/dL (ref 0–40)

## 2010-11-07 LAB — CBC WITH DIFFERENTIAL/PLATELET
Hemoglobin: 13 g/dL (ref 12.0–15.0)
Lymphs Abs: 2.6 10*3/uL (ref 0.7–4.0)
Monocytes Relative: 9 % (ref 3–12)
Neutro Abs: 3.5 10*3/uL (ref 1.7–7.7)
Neutrophils Relative %: 50 % (ref 43–77)
RBC: 4.43 MIL/uL (ref 3.87–5.11)

## 2010-11-07 MED ORDER — PRAVASTATIN SODIUM 40 MG PO TABS: 40.0000 mg | ORAL_TABLET | Freq: Every day | ORAL | Status: DC

## 2010-11-07 NOTE — Progress Notes (Signed)
Addended by: Kristeen Miss on: 11/07/2010 10:30 AM   Modules accepted: Orders

## 2010-11-07 NOTE — Progress Notes (Signed)
Subjective:    Patient ID: Elizabeth Espinoza, female    DOB: 1941/01/17, 70 y.o.   MRN: [id number]  HPI Medicare Wellness Visit:  The following psychosocial & medical history were reviewed as required by Medicare.   Social history: caffeine: none , alcohol:  rarely ,  tobacco use : never  & exercise : see below.   Home & personal  safety / fall risk: no issues, activities of daily living:  No limitations , seatbelt use : yes , and smoke alarm employment : yes .  Power of Attorney/Living Will status : unsure  Vision ( as recorded per Nurse) & Hearing  evaluation :  Last Ophth exam 2012 (Glaucoma suspect); acuity to whisper @ 6 ft minimally reduced. Orientation :oriented X 3 , memory & recall :good, spelling  testing: good,and mood & affect : normal . Depression / anxiety: no issues Travel history : [place] 2007 , immunization status : Shingles needed , transfusion history:  Yes with TAH 1976, and preventive health surveillance ( colonoscopies, BMD , etc as per protocol/ [facility]): colonoscopy up to date, Dental care:  Seen every 12 mos . Chart reviewed &  Updated. Active issues reviewed & addressed.       Review of Systems Dyslipidemia assessment: Prior Advanced Lipid Testing: NMR 2006: LDL goal = < 100.   Family history of premature CAD/ MI: bro  @ 89 .  Nutrition: no plan .  Exercise: gym 4 X / week . Diabetes : no . HTN: no.   Weight :  stable. ROS: chest pain : no ;claudication: no; palpitations: no;  Myalgias:occasional leg cramps @ night;    Thyroid function monitor : Medications status(change in dose/brand/mode of administration):no Constitutional:  Fatigue:no; Sleep pattern:restless; Appetite:good  Visual change(blurred/diplopia/visual loss):no Hoarseness:no; Swallowing issues:no GI: Constipation:no; Diarrhea:no Derm: Change in nails/hair/skin:no Neuro: Numbness/tingling:no; Tremor:no Endo: Temperature intolerance: Heat:yes; Cold:no        Objective:   Physical Exam Gen.: Thin  but healthy and well-nourished in appearance. Alert, appropriate and cooperative throughout exam. Head: Normocephalic without obvious abnormalities Eyes: No corneal or conjunctival inflammation noted. Extraocular motion intact.  Ears: External  ear exam reveals no significant lesions or deformities. Canals clear .TMs normal.  Nose: External nasal exam reveals no deformity or inflammation. Nasal mucosa are pink and moist. No lesions or exudates noted.  Mouth: Oral mucosa and oropharynx reveal no lesions or exudates. Teeth in good repair. Neck: No deformities, masses, or tenderness noted. Range of motion & . Thyroid normal. Lungs: Normal respiratory effort; chest expands symmetrically. Lungs are clear to auscultation without rales, wheezes, or increased work of breathing. Heart: Normal rate and rhythm. Normal S1 and S2. No gallop, or rub. Minor , soft click @ apex; no  murmur. Abdomen: Bowel sounds normal; abdomen soft and nontender. No masses, organomegaly or hernias noted. Aorta palpable w/o AAA Genitalia: Dr Willis Modena   .                                                                                   Musculoskeletal/extremities: No deformity or scoliosis noted of  the thoracic or lumbar spine. No clubbing, cyanosis, edema, or deformity noted. Range of  motion  normal .Tone & strength  normal.Joints normal. Nail health  good. Vascular: Carotid, radial artery, dorsalis pedis and  posterior tibial pulses are full and equal. No bruits present. Neurologic: Alert and oriented x3. Deep tendon reflexes symmetrical and normal.   No tremor        Skin: Intact without suspicious lesions or rashes. Lymph: No cervical, axillary  lymphadenopathy present. Psych: Mood and affect are normal. Normally interactive                                                                                        Assessment & Plan:  #1 Medicare Wellness Exam; criteria met ; data entered #2 Problem List reviewed ;  Assessment/ Recommendations made Plan: see Orders

## 2010-11-07 NOTE — Patient Instructions (Signed)
Preventive Health Care: Exercise  30-45  minutes a day, 3-4 days a week. Walking is especially valuable in preventing Osteoporosis. Eat a low-fat diet with lots of fruits and vegetables, up to 7-9 servings per day. Consume less than 30 grams of sugar per day from foods & drinks with High Fructose Corn Syrup as #2,3 or #4 on label. Health Care Power of [place] Will place you in charge of your health care  decisions. Verify these are  in place.

## 2010-11-08 LAB — BASIC METABOLIC PANEL
Calcium: 9.9 mg/dL (ref 8.4–10.5)
Glucose, Bld: 102 mg/dL — ABNORMAL HIGH (ref 70–99)
Sodium: 142 mEq/L (ref 135–145)

## 2010-12-21 ENCOUNTER — Telehealth: Payer: Self-pay

## 2010-12-21 NOTE — Telephone Encounter (Signed)
Message copied by Secundino Ginger on Thu Dec 21, 2010 10:09 AM ------      Message from: Aviva Kluver      Created: Fri Dec 15, 2010  9:47 AM      Regarding: FW: Incorrect code for lab       Please follow up on this.      Thanks,      Izora Gala      ----- Message -----         From: Marybelle Killings, RN         Sent: 12/06/2010   2:25 PM           To: Aviva Kluver      Subject: RE: Incorrect code for lab                               Izora Gala,            These labs were sent to [facility], not billed by [place]. So I would say that Chrae needs to follow up with [facility] because Dx codes were provided. Upon review of the chart was the A1c added? I see no result. Chrae also needs to follow up on this. We can discuss at 3:30p      Levander Campion       ----- Message -----         From: Aviva Kluver         Sent: 12/06/2010   1:03 PM           To: Marybelle Killings, RN      Subject: Incorrect code for lab                                   Ms. Chiong called stating Medicare refused to pay $51.50 for labs on 10/16 because a reason was not given/coded wrong.  Do i forward this to [facility], or is it something Chrae needs to work with Dr. Linna Darner on?  Thanks. Izora Gala

## 2010-12-21 NOTE — Telephone Encounter (Signed)
Spoke with Elizabeth Espinoza in the billing department of [place], she states the BMP is what medicare will not cover, v70.0 code provided. The physician needs to provided a code from the medicare coding book. [place] [address]  Side note: A1c added by lab Tech, if A1c can not be added [name] is usually notified. I have discussed this specific situation with [name], she is following up on this

## 2010-12-26 NOTE — Telephone Encounter (Signed)
Per Dr.Hopper patient does not have medicare she has a Multimedia programmer  I spoke with insurance and billing and they stated patient does have Medicare because Medicare has already paid part of the bill.   I then called and spoke with patient, patient stated she does have Medicare since the age of 72.    **I spoke with Elizabeth Espinoza again about A1c was it added, she will follow-up.   I discussed this message again with Dr.Hopper and the code 995.20 was given, I then called [facility] and provided code. I was told info will be submitted to Medicare again.   I called and informed patient of status update

## 2011-01-13 ENCOUNTER — Other Ambulatory Visit: Payer: Self-pay | Admitting: Internal Medicine

## 2011-01-15 NOTE — Telephone Encounter (Signed)
CPE 10/16, labs OK, continue meds.  RX sent.

## 2011-02-13 ENCOUNTER — Other Ambulatory Visit: Payer: Self-pay | Admitting: Internal Medicine

## 2011-02-13 DIAGNOSIS — Z1231 Encounter for screening mammogram for malignant neoplasm of breast: Secondary | ICD-10-CM

## 2011-03-05 ENCOUNTER — Other Ambulatory Visit: Payer: Medicare Other

## 2011-03-06 ENCOUNTER — Other Ambulatory Visit: Payer: Medicare Other

## 2011-03-06 ENCOUNTER — Other Ambulatory Visit (INDEPENDENT_AMBULATORY_CARE_PROVIDER_SITE_OTHER): Payer: Medicare Other

## 2011-03-06 ENCOUNTER — Other Ambulatory Visit: Payer: Self-pay | Admitting: Internal Medicine

## 2011-03-06 DIAGNOSIS — R7309 Other abnormal glucose: Secondary | ICD-10-CM

## 2011-03-06 DIAGNOSIS — E039 Hypothyroidism, unspecified: Secondary | ICD-10-CM | POA: Diagnosis not present

## 2011-03-06 LAB — HEMOGLOBIN A1C: Hgb A1c MFr Bld: 5.9 % (ref 4.6–6.5)

## 2011-03-14 ENCOUNTER — Ambulatory Visit
Admission: RE | Admit: 2011-03-14 | Discharge: 2011-03-14 | Disposition: A | Discharge status: Home / Self Care | Payer: Medicare Other | Source: Ambulatory Visit | Attending: Internal Medicine | Admitting: Internal Medicine

## 2011-03-14 DIAGNOSIS — Z1231 Encounter for screening mammogram for malignant neoplasm of breast: Secondary | ICD-10-CM

## 2011-03-16 ENCOUNTER — Encounter: Payer: Self-pay | Admitting: Internal Medicine

## 2011-03-16 ENCOUNTER — Ambulatory Visit (INDEPENDENT_AMBULATORY_CARE_PROVIDER_SITE_OTHER): Payer: Medicare Other | Admitting: Internal Medicine

## 2011-03-16 DIAGNOSIS — G479 Sleep disorder, unspecified: Secondary | ICD-10-CM

## 2011-03-16 DIAGNOSIS — K219 Gastro-esophageal reflux disease without esophagitis: Secondary | ICD-10-CM

## 2011-03-16 DIAGNOSIS — E039 Hypothyroidism, unspecified: Secondary | ICD-10-CM | POA: Diagnosis not present

## 2011-03-16 DIAGNOSIS — G478 Other sleep disorders: Secondary | ICD-10-CM | POA: Diagnosis not present

## 2011-03-16 DIAGNOSIS — R0681 Apnea, not elsewhere classified: Secondary | ICD-10-CM | POA: Diagnosis not present

## 2011-03-16 DIAGNOSIS — R1310 Dysphagia, unspecified: Secondary | ICD-10-CM

## 2011-03-16 MED ORDER — LEVOTHYROXINE SODIUM 100 MCG PO TABS: ORAL_TABLET | ORAL | Status: DC

## 2011-03-16 MED ORDER — RANITIDINE HCL 150 MG PO TABS: 150.0000 mg | ORAL_TABLET | Freq: Two times a day (BID) | ORAL | Status: DC

## 2011-03-16 NOTE — Assessment & Plan Note (Signed)
Her gastroenterologist recommended ranitidine as needed. She's not been taking this. She should be evaluated because of the dysphagia occurring one to 2 times per week. Ranitidine 150 mg twice a day will be initiated.

## 2011-03-16 NOTE — Progress Notes (Signed)
  Subjective:    Patient ID: Elizabeth Espinoza, female    DOB: 1940-12-18, 71 y.o.   MRN: [id number]  HPI Thyroid function monitor  Medications status(change in dose/brand/mode of administration):no Constitutional: Weight change: no; Fatigue:some; Sleep pattern:poor (SEE BELOW); Appetite:good  Visual change(blurred/diplopia/visual loss):no Hoarseness:occasionally; Swallowing issues:occasional dysphagia, 1-2 X / week with cornbread Cardiovascular: Palpitations:no; Racing:no; Irregularity:no GI: Constipation:no; Diarrhea:no Derm: Change in nails/hair/skin:eyebrow loss & some hair loss Neuro: Numbness/tingling:in feet & hands; Tremor:no Psych: Anxiety:no; Depression:no; Panic attacks:no Endo: Temperature intolerance: Heat:no; Cold:yes; she describes some Raynaud's phenomena in the right index finger  TSH is 0.21 indicating excess thyroid replacement on Unithroid 100 mcg daily except one half on Tuesdays and Thursdays      Review of Systems SLEEPING DISORDER Onset:3-4 years Pattern: Difficulty going to sleep:occasionally Frequent awakening:yes Early awakening:yes  Nightmares:no Abnormal leg movement:no Snoring:occasionally, "funny breathing" Apnea:yes; no evaluation Risk factors/sleep hygiene: Stimulants:no Alcohol intake:no Reading, watching TV, eating in bed : occasionally reads in bed Daytime naps:no Stress/anxiety:no, "just blank" Work/travel factors:no Impact: Daytime hypersomnolence: no Motor vehicle accident/motor dysfunction:no Treatment to date/efficacy: Melatonin & [place] tea w/o benefit       Objective:   Physical Exam  Gen.: Thin but well-nourished; in no acute distress Eyes: Extraocular motion intact; no lid lag or proptosis  Mouth: Excellent no hygiene and normal oropharyngeal diameter. No erythema  Neck: No nodules or thyroid enlargement Heart: Normal rhythm and rate without significant murmur, gallop, or extra heart sounds Lungs: Chest clear to  auscultation without rales,rales, wheezes  Abdomen: No tenderness, organomegaly, or masses. Aorta palpable without aortic aneurysm Neuro:Deep tendon reflexes are equal and within normal limits; no tremor  Skin: Warm and dry without significant lesions or rashes; no onycholysis Psych: Normally communicative and interactive; no abnormal mood or affect clinically.        Assessment & Plan:  #1 hypothyroidism, excess supplementation. See problem list with recommendations  #2 sleep disorder; rule out sleep apnea  #3 dysphasia 1-2 times per week; GI evaluation indicated.  Plan: See orders and recommendations

## 2011-03-16 NOTE — Assessment & Plan Note (Signed)
Unithroid will be decreased to 100 mcg one half pill every day except for one by mouth on Tuesday and Thursday. TSH should be repeated in 10 weeks.

## 2011-03-16 NOTE — Patient Instructions (Signed)
The triggers for reflux  include stress; the "aspirin family" ; alcohol; peppermint; and caffeine (coffee, tea, cola, and chocolate). The aspirin family would include aspirin and the nonsteroidal agents such as ibuprofen &  Naproxen. Tylenol would not cause reflux; food & drink should be avoided for @ least 2 hours before going to bed.  To prevent sleep dysfunction follow these instructions for sleep hygiene. Do not read, watch TV, or eat in bed. Do not get into bed until you are ready to turn off the light &  to go to sleep. Do not ingest stimulants ( decongestants, diet pills, nicotine, caffeine) after the evening meal.  PLEASE BRING THESE INSTRUCTIONS TO FOLLOW UP  LAB APPOINTMENT in 10 weeks.This will guarantee correct labs are drawn, eliminating need for repeat blood sampling ( needle sticks ! ). Diagnoses /Codes: 357.9

## 2011-03-30 ENCOUNTER — Institutional Professional Consult (permissible substitution): Payer: Medicare Other | Admitting: Pulmonary Disease

## 2011-04-13 ENCOUNTER — Ambulatory Visit (INDEPENDENT_AMBULATORY_CARE_PROVIDER_SITE_OTHER): Payer: Medicare Other | Admitting: Pulmonary Disease

## 2011-04-13 ENCOUNTER — Encounter: Payer: Self-pay | Admitting: Pulmonary Disease

## 2011-04-13 VITALS — BP 136/84 | HR 62 | Temp 98.3°F | Ht 66.0 in | Wt 148.0 lb

## 2011-04-13 DIAGNOSIS — G4700 Insomnia, unspecified: Secondary | ICD-10-CM | POA: Insufficient documentation

## 2011-04-13 DIAGNOSIS — G2581 Restless legs syndrome: Secondary | ICD-10-CM

## 2011-04-13 MED ORDER — ROPINIROLE HCL 0.5 MG PO TABS: ORAL_TABLET | ORAL | Status: DC

## 2011-04-13 NOTE — Assessment & Plan Note (Signed)
The patient gives a history that is classic for the restless leg syndrome.  She states this will often keep her from returning to sleep, but it is unclear how much this is contributing to her insomnia issues.  I would like to treat her with a dopamine agonist, and hopefully this will improve her sleep.  She may also need a check of an iron panel given her history.

## 2011-04-13 NOTE — Assessment & Plan Note (Signed)
The patient gives a history for insomnia for the last 2 years, and feels that it is getting worse.  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  I have gone over the various behavioral therapies with her, including stimulus control.  I have also reviewed good sleep hygiene.

## 2011-04-13 NOTE — Progress Notes (Signed)
  Subjective:    Patient ID: Elizabeth Espinoza, female    DOB: December 26, 1940, 71 y.o.   MRN: [id number]  HPI The patient is a 71 year old female who been asked to see for sleeping difficulties.  The patient states this has been an issue for her for at least 2 years, and has been getting worse.  She has issues with both sleep onset as well as sleep maintenance.  She will typically go to bed around 11:00, and does not read or watch television in bed.  She will fall asleep in less than 30 minutes 3 nights out of 7, and others may take up to an hour or more.  She will typically awaken multiple times during the night and may take hours to reinitiate sleep or never.  She typically will stay in bed and toss and turn.  She will get up to start her day at 7 AM, and never feels rested.  She does not drink any caffeine during the day, nor does she nap.  The patient has issues with hip pain, but also describes a nagging sensation that her legs "cannot get comfortable".  She will usually have to get up and walk around in order for her legs to improve.  This same sensation can occur in the early evening before going to bed, or while riding in a car on long trips.  Her husband has told her that she kicks excessively during the night.   Review of Systems  Constitutional: Negative for fever and unexpected weight change.  HENT: Positive for congestion and trouble swallowing. Negative for ear pain, nosebleeds, sore throat, rhinorrhea, sneezing, dental problem, postnasal drip and sinus pressure.   Eyes: Negative for redness and itching.  Respiratory: Positive for cough. Negative for chest tightness, shortness of breath and wheezing.   Cardiovascular: Negative for palpitations and leg swelling.  Gastrointestinal: Negative for nausea and vomiting.  Genitourinary: Negative for dysuria.  Musculoskeletal: Negative for joint swelling.  Skin: Positive for rash.  Neurological: Negative for headaches.  Hematological: Does not  bruise/bleed easily.  Psychiatric/Behavioral: Negative for dysphoric mood. The patient is not nervous/anxious.        Objective:   Physical Exam Constitutional:  Well developed, no acute distress  HENT:  Nares patent without discharge  Oropharynx without exudate, palate and uvula are normal  Eyes:  Perrla, eomi, no scleral icterus  Neck:  No JVD, no TMG  Cardiovascular:  Normal rate, regular rhythm, no rubs or gallops.  No murmurs        Intact distal pulses  Pulmonary :  Normal breath sounds, no stridor or respiratory distress   No rales, rhonchi, or wheezing  Abdominal:  Soft, nondistended, bowel sounds present.  No tenderness noted.   Musculoskeletal:  No lower extremity edema noted.  Lymph Nodes:  No cervical lymphadenopathy noted  Skin:  No cyanosis noted  Neurologic:  Alert, appropriate, moves all 4 extremities without obvious deficit.         Assessment & Plan:

## 2011-04-13 NOTE — Patient Instructions (Signed)
Will treat for restless legs with requip 0.5 mg one after DINNER each night for one week.  If you continue to have the abnormal sensation in your legs during the evening or while trying to sleep, can increase the requip dose to 2 after dinner. Work on the behavioral therapies we discussed.  Do not stay in bed more than 67mn if you cannot fall asleep.  Leave room and read or watch tv.  No eating or drinking, no computers, no puzzles, etc. No napping during day, stay out of bedroom Can try melatonin again, 352mabout 3-4 hrs BEFORE bedtime.   followup with me in 3 weeks.

## 2011-04-30 ENCOUNTER — Telehealth: Payer: Self-pay | Admitting: Internal Medicine

## 2011-04-30 NOTE — Telephone Encounter (Signed)
I reviewed 03/16/11 office visit. I cannot find reference to rash. Was having some loss of her eyebrows. Specialists & insurance organizations  require an updated, current  assessment and written note from the Primary Care physician  to review before they  schedule an appointment to assess symptoms or problems. If we do not have such  a current  assessment of your health issue or complaint in the chart (electronic medical record);you will need to  make an appointment to create this document THEY REQUIRE. It will be necessary to know prior evaluations and treatments of this symptom and response to these interventions. Please bring that medical history & all medications & supplements to that appointment so I can complete the required document.

## 2011-04-30 NOTE — Telephone Encounter (Signed)
Discuss with patient, appt, scheduled.

## 2011-04-30 NOTE — Telephone Encounter (Signed)
Patient calling, tried to make dermatology appointment with [facility] on her own & they told her referral required from PCP, and they can not see her until nov-2013.  Patient states she is broke out arms, back of knees, around neck, only itches when she gets hot, not painful.  Will you please give patient referral?  States she mentioned this to Dr. Linna Darner during her 03-16-2011 office visit.

## 2011-05-01 ENCOUNTER — Encounter: Payer: Self-pay | Admitting: Internal Medicine

## 2011-05-01 ENCOUNTER — Ambulatory Visit (INDEPENDENT_AMBULATORY_CARE_PROVIDER_SITE_OTHER): Payer: Medicare Other | Admitting: Internal Medicine

## 2011-05-01 VITALS — BP 116/78 | HR 80 | Temp 99.0°F | Wt 147.2 lb

## 2011-05-01 DIAGNOSIS — G2581 Restless legs syndrome: Secondary | ICD-10-CM | POA: Diagnosis not present

## 2011-05-01 DIAGNOSIS — T887XXA Unspecified adverse effect of drug or medicament, initial encounter: Secondary | ICD-10-CM | POA: Insufficient documentation

## 2011-05-01 DIAGNOSIS — L500 Allergic urticaria: Secondary | ICD-10-CM | POA: Diagnosis not present

## 2011-05-01 DIAGNOSIS — IMO0001 Reserved for inherently not codable concepts without codable children: Secondary | ICD-10-CM

## 2011-05-01 MED ORDER — PREDNISONE 20 MG PO TABS: 20.0000 mg | ORAL_TABLET | Freq: Two times a day (BID) | ORAL | Status: AC

## 2011-05-01 MED ORDER — HYDROXYZINE HCL 10 MG PO TABS: 10.0000 mg | ORAL_TABLET | Freq: Four times a day (QID) | ORAL | Status: AC | PRN

## 2011-05-01 NOTE — Progress Notes (Signed)
  Subjective:    Patient ID: Elizabeth Espinoza, female    DOB: 09-Sep-1940, 71 y.o.   MRN: [id number]  HPI In mid-January of this year she noted a rash around her neck and upper back after wearing a turtleneck light weight knit sweater. She did notice itching while wearing it. The symptoms have varied somewhat since that time but by mid March a rash appeared on her arms as well.  She was taking Benadryl with some control of itching but was told to stop this because she was on Allegra.  Last week she noted some rash in the popliteal spaces  She has a past history of allergic rhinitis and conjunctivitis but no history of eczema or asthma    Review of Systems She denies itchy eyes, sneezing, or wheezing. She's had no explain weight loss, fever, chills, or sweats.  She has reflux and is on ranitidine 100 mg twice a day     Objective:   Physical Exam She's thin but well-nourished in appearance and in no distress.Appears younger than stated age   ENT exam was unremarkable with no edema, rhinitis, or exudate  She has no lymphadenopathy about the neck or axilla.  Chest is clear with no wheezing  She has no organomegaly or masses  She has a flat salmon-colored irregular rash predominantly over the forearms which blanches with pressure. Dermatographia is elicited.        Assessment & Plan:  #1 pruritic, vascular dermatitis  Plan: See orders &  recommendation

## 2011-05-01 NOTE — Patient Instructions (Addendum)
Go to WebMD for information about urticaria or hives.  Did not take Allegra with hydroxyzine. Hydroxyzine can cause drowsiness; you cannot drive and take this medication.

## 2011-05-01 NOTE — Assessment & Plan Note (Signed)
Ropinirole 0.5 mg has been effective in controlling symptoms

## 2011-05-04 ENCOUNTER — Ambulatory Visit (INDEPENDENT_AMBULATORY_CARE_PROVIDER_SITE_OTHER): Payer: Medicare Other | Admitting: Pulmonary Disease

## 2011-05-04 ENCOUNTER — Encounter: Payer: Self-pay | Admitting: Pulmonary Disease

## 2011-05-04 VITALS — BP 122/70 | HR 63 | Temp 98.5°F | Ht 66.0 in | Wt 149.8 lb

## 2011-05-04 DIAGNOSIS — G2581 Restless legs syndrome: Secondary | ICD-10-CM | POA: Diagnosis not present

## 2011-05-04 DIAGNOSIS — G4700 Insomnia, unspecified: Secondary | ICD-10-CM | POA: Diagnosis not present

## 2011-05-04 MED ORDER — ROPINIROLE HCL 0.5 MG PO TABS: ORAL_TABLET | ORAL | Status: DC

## 2011-05-04 NOTE — Assessment & Plan Note (Signed)
Most likely this was related to her RLS, but I've also reviewed with her good sleep hygiene.

## 2011-05-04 NOTE — Assessment & Plan Note (Signed)
The patient is much improved with treatment with a dopamine agonist.  She feels that her sleep onset and maintenance are no longer an issue.  I've asked her to continue on this treatment, and to let us know if her symptoms worsen.

## 2011-05-04 NOTE — Progress Notes (Signed)
Addended by: Manson Allan on: 05/04/2011 01:53 PM   Modules accepted: Orders

## 2011-05-04 NOTE — Patient Instructions (Signed)
Stay on requip 0.51m one to two each night after supper. Continue with good sleep hygiene. If doing well, followup with me in one year.

## 2011-05-04 NOTE — Progress Notes (Signed)
  Subjective:    Patient ID: Elizabeth Espinoza, female    DOB: 1940/10/05, 71 y.o.   MRN: [id number]  HPI The patient comes in today for followup of her sleep onset and maintenance issues.  She was felt to have the restless leg syndrome at the last visit, and was started on a dopamine agonist.  She has seen significant improvement in her leg symptoms and also in her sleep.  She no longer has sleep onset or maintenance issues.  She is very pleased with her response to therapy.   Review of Systems  Constitutional: Negative for fever and unexpected weight change.  HENT: Negative for ear pain, nosebleeds, congestion, sore throat, rhinorrhea, sneezing, trouble swallowing, dental problem, postnasal drip and sinus pressure.   Eyes: Negative for redness and itching.  Respiratory: Negative for cough, chest tightness, shortness of breath and wheezing.   Cardiovascular: Negative for palpitations and leg swelling.  Gastrointestinal: Negative for nausea and vomiting.  Genitourinary: Negative for dysuria.  Musculoskeletal: Negative for joint swelling.  Skin: Positive for rash.  Neurological: Negative for headaches.  Hematological: Does not bruise/bleed easily.  Psychiatric/Behavioral: Negative for dysphoric mood. The patient is not nervous/anxious.        Objective:   Physical Exam Well-developed female in no acute distress Nose without purulence or discharge noted Lower extremities without edema, no cyanosis Alert and oriented moves all 4 extremities.       Assessment & Plan:

## 2011-05-07 ENCOUNTER — Encounter: Payer: Self-pay | Admitting: *Deleted

## 2011-05-15 ENCOUNTER — Encounter: Payer: Self-pay | Admitting: Internal Medicine

## 2011-05-15 ENCOUNTER — Ambulatory Visit (INDEPENDENT_AMBULATORY_CARE_PROVIDER_SITE_OTHER): Payer: Medicare Other | Admitting: Internal Medicine

## 2011-05-15 DIAGNOSIS — R1310 Dysphagia, unspecified: Secondary | ICD-10-CM | POA: Diagnosis not present

## 2011-05-15 DIAGNOSIS — K219 Gastro-esophageal reflux disease without esophagitis: Secondary | ICD-10-CM

## 2011-05-15 MED ORDER — OMEPRAZOLE 40 MG PO CPDR: 40.0000 mg | DELAYED_RELEASE_CAPSULE | Freq: Every day | ORAL | Status: DC

## 2011-05-15 NOTE — Progress Notes (Signed)
Elizabeth Espinoza [birth year] MRN [id number]  History of Present Illness:  This is a 71 year old white female with chronic gastroesophageal reflux and erosive esophagitis on her last endoscopy in July 2009. She now has intermittent solid food dysphagia, chest pain and regurgitation. She stopped taking her acid reducers for several years but started back on Zantac 150 mg twice a day 2 months ago. She has a known 3 cm hiatal hernia extending from 35-38 cm from the incisors. Esophageal biopsies in 2009 did not show any evidence of Barrett's esophagus. Her brother had Barrett's esophagus. She is up-to-date on her colonoscopy which was done in April 2005 for rectal bleeding and was essentially normal.    Past Medical History  Diagnosis Date  . MVP (mitral valve prolapse)     documented on 2 D ECHO  . Allergic rhinitis   . Glaucoma     SUSPECT  . GERD (gastroesophageal reflux disease)   . Hypothyroidism   . Hiatal hernia   . Hyperlipidemia    Past Surgical History  Procedure Date  . Total abdominal hysterectomy     for Endometriosis; no BSO, Dr Colin Ina  . Appendectomy     with TAH  . Cardiac catheterization 1989    negative  . Flexible sigmoidoscopy 1999     Dr Olevia Perches  . Knee arthroscopy 2001, 2005    Dr Percell Miller   bilat  . Colonoscopy 2005    reports that she has never smoked. She has never used smokeless tobacco. She reports that she drinks alcohol. She reports that she does not use illicit drugs. family history includes Allergies in her sister; Aneurysm in her mother; Barrett's esophagus in her brother; Diabetes in her brother and paternal grandmother; Heart attack (age of onset:61) in her brother; Heart disease in her paternal grandmother; Lung cancer in her father; Stroke in her brother and paternal grandfather; and Uterine cancer in her paternal aunt. Allergies  Allergen Reactions  . Caffeine     palpitations  . Chlorpheniramine-Pseudoeph     palpitations  . Maxifed    palpitations        Review of Systems: Occasional solid food dysphagia. Chest discomfort. Denies change in bowel habits  The remainder of the 10 point ROS is negative except as outlined in H&P   Physical Exam: General appearance  Well developed, in no distress. Eyes- non icteric. No hoarseness normal voice HEENT nontraumatic, normocephalic. Mouth no lesions, tongue papillated, no cheilosis. Neck supple without adenopathy, thyroid not enlarged, no carotid bruits, no JVD. Lungs Clear to auscultation bilaterally. Cor normal S1, normal S2, regular rhythm, no murmur,  quiet precordium. Abdomen: Soft with minimal discomfort it in subxiphoid area. Normoactive bowel sounds. No distention. Liver edge at costal margin. Rectal: Not done. Extremities no pedal edema. Skin no lesions., Resolving pruritic rash and upper extremities Neurological alert and oriented x 3. Psychological normal mood and affect.  Assessment and Plan:  Problem #1 Chronic gastroesophageal reflux, poorly treated, now with exacerbation of reflux, chest pain and new onset solid food dysphagia suggesting a distal esophageal stricture. She has a known hiatal hernia and is to stay on acid reducers on a long-term basis. We have discussed antireflux measures. We will schedule her for an upper endoscopy with possible esophageal dilation. We will also obtain biopsies from the GE junction to rule out Barrett's esophagus. We will change her from Zantac to omeprazole 40 mg once a day.   05/15/2011 Delfin Edis

## 2011-05-15 NOTE — Patient Instructions (Signed)
We have sent the following medications to your pharmacy for you to pick up at your convenience: Omeprazole (in place of zantac) You have been scheduled for an endoscopy with propofol. Please follow written instructions given to you at your visit today.  Gastroesophageal Reflux Disease, Adult Gastroesophageal reflux disease (GERD) happens when acid from your stomach flows up into the esophagus. When acid comes in contact with the esophagus, the acid causes soreness (inflammation) in the esophagus. Over time, GERD may create small holes (ulcers) in the lining of the esophagus. CAUSES   Increased body weight. This puts pressure on the stomach, making acid rise from the stomach into the esophagus.   Smoking. This increases acid production in the stomach.   Drinking alcohol. This causes decreased pressure in the lower esophageal sphincter (valve or ring of muscle between the esophagus and stomach), allowing acid from the stomach into the esophagus.   Late evening meals and a full stomach. This increases pressure and acid production in the stomach.   A malformed lower esophageal sphincter.  Sometimes, no cause is found. SYMPTOMS   Burning pain in the lower part of the mid-chest behind the breastbone and in the mid-stomach area. This may occur twice a week or more often.   Trouble swallowing.   Sore throat.   Dry cough.   Asthma-like symptoms including chest tightness, shortness of breath, or wheezing.  DIAGNOSIS  Your caregiver may be able to diagnose GERD based on your symptoms. In some cases, X-rays and other tests may be done to check for complications or to check the condition of your stomach and esophagus. TREATMENT  Your caregiver may recommend over-the-counter or prescription medicines to help decrease acid production. Ask your caregiver before starting or adding any new medicines.  HOME CARE INSTRUCTIONS   Change the factors that you can control. Ask your caregiver for guidance  concerning weight loss, quitting smoking, and alcohol consumption.   Avoid foods and drinks that make your symptoms worse, such as:   Caffeine or alcoholic drinks.   Chocolate.   Peppermint or mint flavorings.   Garlic and onions.   Spicy foods.   Citrus fruits, such as oranges, lemons, or limes.   Tomato-based foods such as sauce, chili, salsa, and pizza.   Fried and fatty foods.   Avoid lying down for the 3 hours prior to your bedtime or prior to taking a nap.   Eat small, frequent meals instead of large meals.   Wear loose-fitting clothing. Do not wear anything tight around your waist that causes pressure on your stomach.   Raise the head of your bed 6 to 8 inches with wood blocks to help you sleep. Extra pillows will not help.   Only take over-the-counter or prescription medicines for pain, discomfort, or fever as directed by your caregiver.   Do not take aspirin, ibuprofen, or other nonsteroidal anti-inflammatory drugs (NSAIDs).  SEEK IMMEDIATE MEDICAL CARE IF:   You have pain in your arms, neck, jaw, teeth, or back.   Your pain increases or changes in intensity or duration.   You develop nausea, vomiting, or sweating (diaphoresis).   You develop shortness of breath, or you faint.   Your vomit is green, yellow, black, or looks like coffee grounds or blood.   Your stool is red, bloody, or black.  These symptoms could be signs of other problems, such as heart disease, gastric bleeding, or esophageal bleeding. MAKE SURE YOU:   Understand these instructions.   Will watch your  condition.   Will get help right away if you are not doing well or get worse.  Document Released: 10/18/2004 Document Revised: 12/28/2010 Document Reviewed: 07/28/2010 [facility] Patient Information 2012 [place].  CC: Dr Unice Cobble

## 2011-05-29 ENCOUNTER — Telehealth: Payer: Self-pay | Admitting: *Deleted

## 2011-05-29 DIAGNOSIS — E039 Hypothyroidism, unspecified: Secondary | ICD-10-CM

## 2011-05-29 NOTE — Telephone Encounter (Signed)
Labs ordered.

## 2011-05-29 NOTE — Telephone Encounter (Signed)
Please see last AVS; TSH  244.9

## 2011-05-29 NOTE — Telephone Encounter (Signed)
Pt due to have labs done in 10 weeks please advise on which labs you would like to have done. See OV 2011/03/26 Diagnoses /Codes: 614.9.Please advise   Pt to go to [facility] for labs

## 2011-05-30 DIAGNOSIS — J309 Allergic rhinitis, unspecified: Secondary | ICD-10-CM | POA: Diagnosis not present

## 2011-05-30 DIAGNOSIS — R21 Rash and other nonspecific skin eruption: Secondary | ICD-10-CM | POA: Diagnosis not present

## 2011-05-31 ENCOUNTER — Other Ambulatory Visit (INDEPENDENT_AMBULATORY_CARE_PROVIDER_SITE_OTHER): Payer: Medicare Other

## 2011-05-31 DIAGNOSIS — E039 Hypothyroidism, unspecified: Secondary | ICD-10-CM | POA: Diagnosis not present

## 2011-05-31 LAB — TSH: TSH: 4.94 u[IU]/mL (ref 0.35–5.50)

## 2011-06-20 ENCOUNTER — Ambulatory Visit (INDEPENDENT_AMBULATORY_CARE_PROVIDER_SITE_OTHER): Payer: Medicare Other | Admitting: Internal Medicine

## 2011-06-20 ENCOUNTER — Encounter: Payer: Self-pay | Admitting: Internal Medicine

## 2011-06-20 VITALS — BP 124/80 | HR 111 | Temp 98.8°F | Wt 146.4 lb

## 2011-06-20 DIAGNOSIS — E782 Mixed hyperlipidemia: Secondary | ICD-10-CM | POA: Diagnosis not present

## 2011-06-20 DIAGNOSIS — M791 Myalgia, unspecified site: Secondary | ICD-10-CM

## 2011-06-20 DIAGNOSIS — D649 Anemia, unspecified: Secondary | ICD-10-CM

## 2011-06-20 DIAGNOSIS — G2581 Restless legs syndrome: Secondary | ICD-10-CM | POA: Diagnosis not present

## 2011-06-20 DIAGNOSIS — IMO0001 Reserved for inherently not codable concepts without codable children: Secondary | ICD-10-CM | POA: Diagnosis not present

## 2011-06-20 DIAGNOSIS — M79605 Pain in left leg: Secondary | ICD-10-CM

## 2011-06-20 DIAGNOSIS — M79609 Pain in unspecified limb: Secondary | ICD-10-CM | POA: Diagnosis not present

## 2011-06-20 DIAGNOSIS — M79604 Pain in right leg: Secondary | ICD-10-CM

## 2011-06-20 NOTE — Progress Notes (Signed)
Subjective:    Patient ID: Elizabeth Espinoza, female    DOB: [birth year], 71 y.o.   MRN: [id number]  HPI For 2-3 weeks she has had muscular cramping in both legs at night. This extends from the ankle to the hip area bilaterally. This can be both anterior and posterior in location. Infrequently she'll have similar symptoms during the day.  She feels she must get up and walk around for relief.  She's had numbness in her toes associated with sensation of decreased temperature.  Supplementing potassium in the form of bananas was of no benefit.  Dr. Gwenette Greet prescribed Requip for his restless leg syndrome; she only  takes 0.5 mg daily.  In December 2008 her hematocrit was 34.8. Her last CBC was normal in October 2012.  Her grandmother had peripheral edema and chronic leg problems in the context of having had 12 children    Review of Systems She denies claudication; weakness in the legs; or stool or urinary incontinence. She denies any color change over the extremities except for slight blueness in her toes when they're cold.  She questioned possible relationship of her leg symptoms to statin therapy.  The excessive TSH suppression has resolved based on a TSH of 4.94 drawn 05/31/11. TSH had been 0.21     Objective:   Physical Exam Gen.: Thin but healthy and well-nourished in appearance. Alert, appropriate and cooperative throughout exam.  Eyes: No corneal or conjunctival inflammation noted. No icterus Lungs: Normal respiratory effort; chest expands symmetrically. Lungs are clear to auscultation without rales, wheezes, or increased work of breathing. Heart: Normal rate and rhythm. Normal S1 and S2. No gallop, click, or rub. S4 w/o murmur. Abdomen: Bowel sounds normal; abdomen soft and nontender. No masses, organomegaly or hernias noted.                                                                        Musculoskeletal/extremities: No deformity or scoliosis noted of  the thoracic or lumbar  spine. No clubbing, cyanosis, edema, or deformity noted. Range of motion  normal .Tone & strength  normal.Joints normal. Nail health  good. Vascular: Carotid, radial artery, dorsalis pedis and  posterior tibial pulses are full and equal. No bruits present. Neurologic: Alert and oriented x3. Deep tendon reflexes symmetrical and normal. Light touch normal over feet.          Skin: Intact without suspicious lesions or rashes. No ischemic changes present Lymph: No cervical, axillary lymphadenopathy present. Psych: Mood and affect are normal. Normally interactive                                                                                         Assessment & Plan:  #1 leg pain, mainly nocturnal. Restless leg syndrome variant still suggested. A trial of Requip 0.5 mg 2 pills nightly is recommended pending return of labs.  #2 anemia, past medical  history of. Iron deficiency must be ruled out.  #3 statin therapy; CK will be checked

## 2011-06-20 NOTE — Patient Instructions (Signed)
Take the Requip 0.5 mg 2 each evening.Please try to go on My Chart within the next 24 hours to allow me to release the results directly to you.

## 2011-06-21 LAB — CBC WITH DIFFERENTIAL/PLATELET
Basophils Relative: 0.6 % (ref 0.0–3.0)
Eosinophils Absolute: 0.2 10*3/uL (ref 0.0–0.7)
MCHC: 32.9 g/dL (ref 30.0–36.0)
MCV: 91.7 fl (ref 78.0–100.0)
Monocytes Absolute: 0.5 10*3/uL (ref 0.1–1.0)
Neutrophils Relative %: 38 % — ABNORMAL LOW (ref 43.0–77.0)
Platelets: 184 10*3/uL (ref 150.0–400.0)
RBC: 4.54 Mil/uL (ref 3.87–5.11)

## 2011-06-21 LAB — IBC PANEL: Transferrin: 277.2 mg/dL (ref 212.0–360.0)

## 2011-07-04 ENCOUNTER — Ambulatory Visit (AMBULATORY_SURGERY_CENTER): Payer: Medicare Other | Admitting: Internal Medicine

## 2011-07-04 ENCOUNTER — Encounter: Payer: Self-pay | Admitting: Internal Medicine

## 2011-07-04 VITALS — BP 122/76 | HR 62 | Temp 97.9°F | Resp 20 | Ht 65.0 in | Wt 148.0 lb

## 2011-07-04 DIAGNOSIS — F411 Generalized anxiety disorder: Secondary | ICD-10-CM | POA: Diagnosis not present

## 2011-07-04 DIAGNOSIS — R1311 Dysphagia, oral phase: Secondary | ICD-10-CM | POA: Diagnosis not present

## 2011-07-04 DIAGNOSIS — R1310 Dysphagia, unspecified: Secondary | ICD-10-CM | POA: Diagnosis not present

## 2011-07-04 DIAGNOSIS — K219 Gastro-esophageal reflux disease without esophagitis: Secondary | ICD-10-CM

## 2011-07-04 DIAGNOSIS — K2270 Barrett's esophagus without dysplasia: Secondary | ICD-10-CM | POA: Diagnosis not present

## 2011-07-04 HISTORY — PX: OTHER SURGICAL HISTORY: SHX169

## 2011-07-04 MED ORDER — RANITIDINE HCL 150 MG PO TABS: 300.0000 mg | ORAL_TABLET | Freq: Two times a day (BID) | ORAL | Status: DC

## 2011-07-04 MED ORDER — SODIUM CHLORIDE 0.9 % IV SOLN: 500.0000 mL | INTRAVENOUS | Status: DC

## 2011-07-04 NOTE — Op Note (Signed)
[facility] [address] [place], [place]  [id number]  ENDOSCOPY PROCEDURE REPORT  PATIENT:  Elizabeth, Espinoza  MR#:  [id number] BIRTHDATE:  1940-11-24, 89 yrs. old  GENDER:  female  ENDOSCOPIST:  Lowella Bandy. Olevia Perches, MD Referred by:  Unice Cobble, M.D.  PROCEDURE DATE:  07/04/2011 PROCEDURE:  EGD with biopsy, 43239, Maloney Dilation of Esophagus ASA CLASS:  Class II INDICATIONS:  dysphagia, GERD EGD 2009- reflux esophagitis,, brother with Barrett's esophagus, Prilosec causes cramps, prefers Ranitidine  MEDICATIONS:   MAC sedation, administered by CRNA, propofol (Diprivan) 140 mg TOPICAL ANESTHETIC:  Cetacaine Spray  DESCRIPTION OF PROCEDURE:   After the risks benefits and alternatives of the procedure were thoroughly explained, informed consent was obtained.  The LB-GIF Q180 [id number] endoscope was introduced through the mouth and advanced to the second portion of the duodenum, without limitations.  The instrument was slowly withdrawn as the mucosa was fully examined. <<PROCEDUREIMAGES>>  A hiatal hernia was found (see image6 and image7). 2-3 cm sliding hiatal hernia, no definite stricture maloney dilator 72F maloney dilator passed without resistance  irregular Z-line. With standard forceps, a biopsy was obtained and sent to pathology (see image8, image3, image2, and image1).  Otherwise the examination was normal (see image4 and image5).    Retroflexed views revealed no abnormalities.    The scope was then withdrawn from the patient and the procedure completed.  COMPLICATIONS:  None  ENDOSCOPIC IMPRESSION: 1) Hiatal hernia 2) Irregular Z-line 3) Otherwise normal examination s/p passage of 74 F maloney dilator RECOMMENDATIONS: 1) Anti-reflux regimen to be follow 2) Await biopsy results switch from Prilosec, which causes cramps, to Ranitidine 362m po bid  REPEAT EXAM:  In 0 year(s) for.  recall in 2 years if  Barrett's present  ______________________________ DLowella Bandy BOlevia Perches MD  CC:  n. eSIGNED:   DLowella Bandy Sahith Nurse at 07/04/2011 03:53 PM  CJosefine Class [id number]

## 2011-07-04 NOTE — Progress Notes (Signed)
Patient did not experience any of the following events: a burn prior to discharge; a fall within the facility; wrong site/side/patient/procedure/implant event; or a hospital transfer or hospital admission upon discharge from the facility. (G8907) Patient did not have preoperative order for IV antibiotic SSI prophylaxis. (G8918)  

## 2011-07-04 NOTE — Patient Instructions (Addendum)
Discharge instructions given with verbal understanding. Handout on a dilatation diet given. Resume previous medications. Stop prilosec which calls cramps to ranitidine 358m  By mouth two times a day. [facility]: Refer to the procedure report that was given to you for any specific questions about what was found during the examination.  If the procedure report does not answer your questions, please call your gastroenterologist to clarify.  If you requested that your care partner not be given the details of your procedure findings, then the procedure report has been included in a sealed envelope for you to review at your convenience later.  YOU SHOULD EXPECT: Some feelings of bloating in the abdomen. Passage of more gas than usual.  Walking can help get rid of the air that was put into your GI tract during the procedure and reduce the bloating. If you had a lower endoscopy (such as a colonoscopy or flexible sigmoidoscopy) you may notice spotting of blood in your stool or on the toilet paper. If you underwent a bowel prep for your procedure, then you may not have a normal bowel movement for a few days.  DIET: Your first meal following the procedure should be a light meal and then it is ok to progress to your normal diet.  A half-sandwich or bowl of soup is an example of a good first meal.  Heavy or fried foods are harder to digest and may make you feel nauseous or bloated.  Likewise meals heavy in dairy and vegetables can cause extra gas to form and this can also increase the bloating.  Drink plenty of fluids but you should avoid alcoholic beverages for 24 hours.  ACTIVITY: Your care partner should take you home directly after the procedure.  You should plan to take it easy, moving slowly for the rest of the day.  You can resume normal activity the day after the procedure however you should NOT DRIVE or use heavy machinery for 24 hours (because of the  sedation medicines used during the test).    SYMPTOMS TO REPORT IMMEDIATELY: A gastroenterologist can be reached at any hour.  During normal business hours, 8:30 AM to 5:00 PM Monday through Friday, call [phone]  After hours and on weekends, please call the GI answering service at [phone] will take a message and have the physician on call contact you.   Following upper endoscopy (EGD)  Vomiting of blood or coffee ground material  New chest pain or pain under the shoulder blades  Painful or persistently difficult swallowing  New shortness of breath  Fever of 100F or higher  Black, tarry-looking stools  FOLLOW UP: If any biopsies were taken you will be contacted by phone or by letter within the next 1-3 weeks.  Call your gastroenterologist if you have not heard about the biopsies in 3 weeks.  Our staff will call the home number listed on your records the next business day following your procedure to check on you and address any questions or concerns that you may have at that time regarding the information given to you following your procedure. This is a courtesy call and so if there is no answer at the home number and we have not heard from you through the emergency physician on call, we will assume that you have returned to your regular daily activities without incident.  SIGNATURES/CONFIDENTIALITY: You and/or your care partner have signed paperwork which will be entered into  your electronic medical record.  These signatures attest to the fact that that the information above on your After Visit Summary has been reviewed and is understood.  Full responsibility of the confidentiality of this discharge information lies with you and/or your care-partner.

## 2011-07-05 ENCOUNTER — Telehealth: Payer: Self-pay | Admitting: *Deleted

## 2011-07-05 NOTE — Telephone Encounter (Signed)
  Follow up Call-  Call back number 07/04/2011  Post procedure Call Back phone  # [phone]  Permission to leave phone message Yes     Patient questions:  Do you have a fever, pain , or abdominal swelling? no Pain Score  0 *  Have you tolerated food without any problems? yes  Have you been able to return to your normal activities? yes  Do you have any questions about your discharge instructions: Diet   no Medications  no Follow up visit  no  Do you have questions or concerns about your Care? no  Actions: * If pain score is 4 or above: No action needed, pain <4.

## 2011-07-10 ENCOUNTER — Encounter: Payer: Self-pay | Admitting: Internal Medicine

## 2011-07-16 ENCOUNTER — Other Ambulatory Visit: Payer: Self-pay | Admitting: Pulmonary Disease

## 2011-07-30 ENCOUNTER — Other Ambulatory Visit: Payer: Self-pay | Admitting: *Deleted

## 2011-07-30 MED ORDER — RANITIDINE HCL 300 MG PO TABS: 300.0000 mg | ORAL_TABLET | Freq: Two times a day (BID) | ORAL | Status: DC

## 2011-07-30 NOTE — Telephone Encounter (Signed)
Pt requests ranitadine 300 mg tablet instead of 2 150 mg tablets bid. Rx sent.

## 2011-08-07 ENCOUNTER — Telehealth: Payer: Self-pay | Admitting: Internal Medicine

## 2011-08-07 NOTE — Telephone Encounter (Signed)
Please start on nexiem 40 mg po qd, #30, may be it will not have the side effects  prilosec had.

## 2011-08-07 NOTE — Telephone Encounter (Signed)
Patient reports worsening reflux.  She is c/o epigastric pain and pain that radiates into her shoulder, she describes this as a burning pain.  She is taking zantac 300 mg BID but this is not controlling her symptoms.  She states that her dysphagia has resolved.  Dr. Olevia Perches please advise.  She was unable to tolerate omeprazole due to leg cramps.

## 2011-08-07 NOTE — Telephone Encounter (Signed)
Left message for patient to call back  

## 2011-08-08 MED ORDER — ESOMEPRAZOLE MAGNESIUM 40 MG PO CPDR: 40.0000 mg | DELAYED_RELEASE_CAPSULE | Freq: Every day | ORAL | Status: DC

## 2011-08-08 NOTE — Telephone Encounter (Signed)
Patient advised.  She will call back for any questions or concerns

## 2011-08-21 DIAGNOSIS — H2510 Age-related nuclear cataract, unspecified eye: Secondary | ICD-10-CM | POA: Diagnosis not present

## 2011-08-21 DIAGNOSIS — H04129 Dry eye syndrome of unspecified lacrimal gland: Secondary | ICD-10-CM | POA: Diagnosis not present

## 2011-08-21 DIAGNOSIS — H40039 Anatomical narrow angle, unspecified eye: Secondary | ICD-10-CM | POA: Diagnosis not present

## 2011-08-21 DIAGNOSIS — H40019 Open angle with borderline findings, low risk, unspecified eye: Secondary | ICD-10-CM | POA: Diagnosis not present

## 2011-09-17 ENCOUNTER — Telehealth: Payer: Self-pay | Admitting: Internal Medicine

## 2011-09-17 MED ORDER — ESOMEPRAZOLE MAGNESIUM 40 MG PO CPDR: 40.0000 mg | DELAYED_RELEASE_CAPSULE | Freq: Every day | ORAL | Status: DC

## 2011-09-17 NOTE — Telephone Encounter (Signed)
I have filled out tier exemption form and have sent it to [employer]. I have also advised patient of this.

## 2011-09-17 NOTE — Telephone Encounter (Signed)
I have advised patient that I have gotten a Tier change Approval from [employer]. I have also advised that I will contact her pharmacy and let them know this as well.

## 2011-09-17 NOTE — Telephone Encounter (Signed)
Pharmacy states that the script went thru for $0.00.

## 2011-09-17 NOTE — Addendum Note (Signed)
Addended by: Larina Bras on: 09/17/2011 02:34 PM   Modules accepted: Orders

## 2011-09-18 ENCOUNTER — Other Ambulatory Visit: Payer: Self-pay | Admitting: Pulmonary Disease

## 2011-09-18 MED ORDER — ROPINIROLE HCL 0.5 MG PO TABS: ORAL_TABLET | ORAL | Status: DC

## 2011-09-18 NOTE — Telephone Encounter (Signed)
PHARMACY REQUESTED 90 DAY SUPPLY OF ROPINIROLE 0.5MG TAKE AS DIRECTED #180 x1 rx sent

## 2011-11-20 ENCOUNTER — Telehealth: Payer: Self-pay | Admitting: Internal Medicine

## 2011-11-20 DIAGNOSIS — E039 Hypothyroidism, unspecified: Secondary | ICD-10-CM

## 2011-11-20 DIAGNOSIS — T887XXA Unspecified adverse effect of drug or medicament, initial encounter: Secondary | ICD-10-CM

## 2011-11-20 DIAGNOSIS — E785 Hyperlipidemia, unspecified: Secondary | ICD-10-CM

## 2011-11-20 DIAGNOSIS — R7989 Other specified abnormal findings of blood chemistry: Secondary | ICD-10-CM

## 2011-11-20 NOTE — Telephone Encounter (Signed)
Spoke w/pt. She will go to fasting to [facility] lab at her convenience.

## 2011-11-20 NOTE — Telephone Encounter (Signed)
Order(s) placed, patient also due to have other labs: cholesterol levels, liver function and CBCD. All orders placed, PLEASE let patient know she needs to fast for labs, she can walk-in @ [facility] 7:30-4:30

## 2011-11-20 NOTE — Telephone Encounter (Signed)
Patient states she is due for TSH. She would like to go to [facility] lab. Can you place the order and I can call pt to let her know. Thanks

## 2011-11-23 ENCOUNTER — Other Ambulatory Visit (INDEPENDENT_AMBULATORY_CARE_PROVIDER_SITE_OTHER): Payer: Medicare Other

## 2011-11-23 DIAGNOSIS — R7989 Other specified abnormal findings of blood chemistry: Secondary | ICD-10-CM

## 2011-11-23 DIAGNOSIS — T887XXA Unspecified adverse effect of drug or medicament, initial encounter: Secondary | ICD-10-CM | POA: Diagnosis not present

## 2011-11-23 DIAGNOSIS — E785 Hyperlipidemia, unspecified: Secondary | ICD-10-CM

## 2011-11-23 DIAGNOSIS — E039 Hypothyroidism, unspecified: Secondary | ICD-10-CM | POA: Diagnosis not present

## 2011-11-23 LAB — CBC WITH DIFFERENTIAL/PLATELET
Basophils Relative: 0.7 % (ref 0.0–3.0)
Eosinophils Relative: 7 % — ABNORMAL HIGH (ref 0.0–5.0)
HCT: 36.4 % (ref 36.0–46.0)
Hemoglobin: 12 g/dL (ref 12.0–15.0)
Lymphs Abs: 1.8 10*3/uL (ref 0.7–4.0)
MCV: 89.9 fl (ref 78.0–100.0)
Monocytes Absolute: 0.5 10*3/uL (ref 0.1–1.0)
Neutro Abs: 3.1 10*3/uL (ref 1.4–7.7)
Platelets: 205 10*3/uL (ref 150.0–400.0)
WBC: 5.8 10*3/uL (ref 4.5–10.5)

## 2011-11-23 LAB — LIPID PANEL
HDL: 49 mg/dL (ref >39.00)
Total CHOL/HDL Ratio: 3
Triglycerides: 71 mg/dL (ref 0.0–149.0)
VLDL: 14.2 mg/dL (ref 0.0–40.0)

## 2011-11-23 LAB — HEPATIC FUNCTION PANEL
AST: 25 U/L (ref 0–37)
Alkaline Phosphatase: 57 U/L (ref 39–117)
Total Bilirubin: 0.3 mg/dL (ref 0.3–1.2)

## 2011-11-27 DIAGNOSIS — Z23 Encounter for immunization: Secondary | ICD-10-CM | POA: Diagnosis not present

## 2011-12-15 ENCOUNTER — Other Ambulatory Visit: Payer: Self-pay | Admitting: Internal Medicine

## 2012-01-09 ENCOUNTER — Telehealth: Payer: Self-pay | Admitting: Internal Medicine

## 2012-01-09 MED ORDER — ZOSTER VACCINE LIVE 19400 UNT/0.65ML ~~LOC~~ SOLR: 0.6500 mL | Freq: Once | SUBCUTANEOUS | Status: DC

## 2012-01-09 NOTE — Telephone Encounter (Signed)
pt called stated she is going to [facility] on [address] to get shingles shot can you send an RX today please cb# [phone]

## 2012-01-09 NOTE — Telephone Encounter (Signed)
RX sent, patient informed

## 2012-01-20 ENCOUNTER — Other Ambulatory Visit: Payer: Self-pay | Admitting: Internal Medicine

## 2012-02-11 ENCOUNTER — Ambulatory Visit (INDEPENDENT_AMBULATORY_CARE_PROVIDER_SITE_OTHER): Payer: Medicare Other | Admitting: Internal Medicine

## 2012-02-11 ENCOUNTER — Encounter: Payer: Self-pay | Admitting: Internal Medicine

## 2012-02-11 VITALS — BP 130/86 | HR 69 | Temp 98.1°F | Resp 12 | Ht 65.5 in | Wt 149.2 lb

## 2012-02-11 DIAGNOSIS — E782 Mixed hyperlipidemia: Secondary | ICD-10-CM | POA: Diagnosis not present

## 2012-02-11 DIAGNOSIS — IMO0001 Reserved for inherently not codable concepts without codable children: Secondary | ICD-10-CM | POA: Diagnosis not present

## 2012-02-11 DIAGNOSIS — Z23 Encounter for immunization: Secondary | ICD-10-CM | POA: Diagnosis not present

## 2012-02-11 DIAGNOSIS — Z0000 Encounter for general adult medical examination without abnormal findings: Secondary | ICD-10-CM

## 2012-02-11 DIAGNOSIS — G2581 Restless legs syndrome: Secondary | ICD-10-CM

## 2012-02-11 DIAGNOSIS — E559 Vitamin D deficiency, unspecified: Secondary | ICD-10-CM | POA: Diagnosis not present

## 2012-02-11 DIAGNOSIS — M791 Myalgia, unspecified site: Secondary | ICD-10-CM

## 2012-02-11 DIAGNOSIS — E039 Hypothyroidism, unspecified: Secondary | ICD-10-CM

## 2012-02-11 NOTE — Patient Instructions (Addendum)
Preventive Health Care: Exercise at least 30-45 minutes a day,  3-4 days a week.  Eat a low-fat diet with lots of fruits and vegetables, up to 7-9 servings per day. Consume less than 40 grams of sugar per day from foods & drinks with High Fructose Corn Sugar as #1,2,3 or # 4 on label. Please perform isometric exercises before going to bed. Sit on side of the bed and raise up on toes to a count of 5. Then put pressure on the heels to a count of 5. Repeat this process 10 times. This will  help prevent cramps.

## 2012-02-11 NOTE — Progress Notes (Signed)
Subjective:    Patient ID: Elizabeth Espinoza, female    DOB: Aug 24, 1940, 72 y.o.   MRN: [id number]  HPI Medicare Wellness Visit:  The following psychosocial & medical history were reviewed as required by Medicare.   Social history: caffeine: none , alcohol: rare ,  tobacco use :never  & exercise : 3-5  classes/ week.   Home & personal  safety / fall risk:no issues, activities of daily living: no limitations , seatbelt use : yes , and smoke alarm employment : yes .  Power of Attorney/Living Will status :in place  Vision ( as recorded per Nurse) & Hearing  evaluation : Ophth exam current; no hearing exam. Orientation :oriented x3 , memory & recall :good, spelling  testing: good,and mood & affect :normal . Depression / anxiety:denied Travel history : 2008 [place] , immunization status :tetanus due , transfusion history: 1976 post TAH, and preventive health surveillance ( colonoscopies, BMD , etc as per protocol/ [facility]): colonoscopy current, Dental care:  Every 6 mos . Chart reviewed &  Updated. Active issues reviewed & addressed.       Review of Systems  Her restless leg syndrome has responded to Requip but she continues to have leg discomfort in the late afternoon/early evening.11/13 labs were reviewed; calcium and magnesium levels were normal     Objective:   Physical Exam Gen.: Thin but healthy and well-nourished in appearance. Alert, appropriate and cooperative throughout exam. Appears younger than stated age  Head: Normocephalic without obvious abnormalities Eyes: No corneal or conjunctival inflammation noted. Pupils equal round reactive to light and accommodation. Fundal exam is benign without hemorrhages, exudate, papilledema. Extraocular motion intact. Vision grossly normal with lenses Ears: External  ear exam reveals no significant lesions or deformities. Canals clear .TMs normal. Hearing is grossly decreased bilaterally, R > L. Nose: External nasal exam reveals no deformity or  inflammation. Nasal mucosa are pink and moist. No lesions or exudates noted.  Mouth: Oral mucosa and oropharynx reveal no lesions or exudates. Teeth in good repair. Neck: No deformities, masses, or tenderness noted. Range of motion . Thyroid L lobe > R w/o nodules. Lungs: Normal respiratory effort; chest expands symmetrically. Lungs are clear to auscultation without rales, wheezes, or increased work of breathing. Heart: Normal rate and rhythm. Normal S1 and S2. No gallop, click, or rub. S 4 w/o murmur. Abdomen: Bowel sounds normal; abdomen soft and nontender. No masses, organomegaly or hernias noted. Genitalia: Dr Willis Modena, Gyn                                    Musculoskeletal/extremities: No deformity or scoliosis noted of  the thoracic or lumbar spine. No clubbing, cyanosis, edema, or significant extremity  deformity noted. Range of motion normal .Tone & strength  normal.Joints normal . Nail health good. Able to lie down & sit up w/o help. Negative SLR bilaterally Vascular: Carotid, radial artery, dorsalis pedis and  posterior tibial pulses are full and equal. No bruits present. Neurologic: Alert and oriented x3. Deep tendon reflexes symmetrical and normal. Skin: Intact without suspicious lesions or rashes. Lymph: No cervical, axillary lymphadenopathy present. Psych: Mood and affect are normal. Normally interactive  Assessment & Plan:  #1 Medicare Wellness Exam; criteria met ; data entered #2 Problem List reviewed ; Assessment/ Recommendations made Plan: see Orders

## 2012-02-12 LAB — BASIC METABOLIC PANEL
BUN: 14 mg/dL (ref 6–23)
CO2: 25 mEq/L (ref 19–32)
Calcium: 9.4 mg/dL (ref 8.4–10.5)
Chloride: 104 mEq/L (ref 96–112)
Creatinine, Ser: 0.7 mg/dL (ref 0.4–1.2)
Glucose, Bld: 87 mg/dL (ref 70–99)

## 2012-02-12 LAB — MAGNESIUM: Magnesium: 2.2 mg/dL (ref 1.5–2.5)

## 2012-02-15 ENCOUNTER — Other Ambulatory Visit: Payer: Self-pay | Admitting: Internal Medicine

## 2012-02-15 DIAGNOSIS — Z1231 Encounter for screening mammogram for malignant neoplasm of breast: Secondary | ICD-10-CM

## 2012-02-18 ENCOUNTER — Telehealth: Payer: Self-pay

## 2012-02-18 NOTE — Telephone Encounter (Signed)
I spoke with patient, patient verbalized understanding of Dr.Hopper's comments.

## 2012-02-18 NOTE — Telephone Encounter (Signed)
Message copied by Logan Bores on Mon Feb 18, 2012  8:26 AM ------      Message from: Hendricks Limes      Created: Sun Feb 17, 2012 11:45 AM       I reviewed the bone density report from 2010. It was normal. Repeat study would be due in 2015. I do not understand why it was not covered as you were postmenopausal and a baseline study was necessary to determine bone integrity.            Please share your medical reports with Dr. Maia Petties and ask him to provide reports for your medical record.

## 2012-02-18 NOTE — Telephone Encounter (Signed)
Left message on VM for patient to return call when available

## 2012-02-21 DIAGNOSIS — M545 Low back pain, unspecified: Secondary | ICD-10-CM | POA: Diagnosis not present

## 2012-02-21 DIAGNOSIS — M4310 Spondylolisthesis, site unspecified: Secondary | ICD-10-CM | POA: Diagnosis not present

## 2012-02-21 DIAGNOSIS — M47817 Spondylosis without myelopathy or radiculopathy, lumbosacral region: Secondary | ICD-10-CM | POA: Diagnosis not present

## 2012-02-26 DIAGNOSIS — M545 Low back pain, unspecified: Secondary | ICD-10-CM | POA: Diagnosis not present

## 2012-02-26 DIAGNOSIS — M4310 Spondylolisthesis, site unspecified: Secondary | ICD-10-CM | POA: Diagnosis not present

## 2012-02-26 DIAGNOSIS — M47817 Spondylosis without myelopathy or radiculopathy, lumbosacral region: Secondary | ICD-10-CM | POA: Diagnosis not present

## 2012-02-28 DIAGNOSIS — M4310 Spondylolisthesis, site unspecified: Secondary | ICD-10-CM | POA: Diagnosis not present

## 2012-02-28 DIAGNOSIS — M47817 Spondylosis without myelopathy or radiculopathy, lumbosacral region: Secondary | ICD-10-CM | POA: Diagnosis not present

## 2012-02-28 DIAGNOSIS — M545 Low back pain, unspecified: Secondary | ICD-10-CM | POA: Diagnosis not present

## 2012-03-03 DIAGNOSIS — M4310 Spondylolisthesis, site unspecified: Secondary | ICD-10-CM | POA: Diagnosis not present

## 2012-03-03 DIAGNOSIS — M545 Low back pain, unspecified: Secondary | ICD-10-CM | POA: Diagnosis not present

## 2012-03-03 DIAGNOSIS — M47817 Spondylosis without myelopathy or radiculopathy, lumbosacral region: Secondary | ICD-10-CM | POA: Diagnosis not present

## 2012-03-05 DIAGNOSIS — M545 Low back pain, unspecified: Secondary | ICD-10-CM | POA: Diagnosis not present

## 2012-03-05 DIAGNOSIS — M47817 Spondylosis without myelopathy or radiculopathy, lumbosacral region: Secondary | ICD-10-CM | POA: Diagnosis not present

## 2012-03-05 DIAGNOSIS — M4310 Spondylolisthesis, site unspecified: Secondary | ICD-10-CM | POA: Diagnosis not present

## 2012-03-10 ENCOUNTER — Other Ambulatory Visit: Payer: Self-pay | Admitting: Internal Medicine

## 2012-03-10 ENCOUNTER — Other Ambulatory Visit: Payer: Self-pay | Admitting: Pulmonary Disease

## 2012-03-10 DIAGNOSIS — M545 Low back pain, unspecified: Secondary | ICD-10-CM | POA: Diagnosis not present

## 2012-03-10 DIAGNOSIS — M47817 Spondylosis without myelopathy or radiculopathy, lumbosacral region: Secondary | ICD-10-CM | POA: Diagnosis not present

## 2012-03-10 DIAGNOSIS — M4310 Spondylolisthesis, site unspecified: Secondary | ICD-10-CM | POA: Diagnosis not present

## 2012-03-12 DIAGNOSIS — M545 Low back pain, unspecified: Secondary | ICD-10-CM | POA: Diagnosis not present

## 2012-03-12 DIAGNOSIS — M47817 Spondylosis without myelopathy or radiculopathy, lumbosacral region: Secondary | ICD-10-CM | POA: Diagnosis not present

## 2012-03-12 DIAGNOSIS — M4310 Spondylolisthesis, site unspecified: Secondary | ICD-10-CM | POA: Diagnosis not present

## 2012-03-13 DIAGNOSIS — M545 Low back pain, unspecified: Secondary | ICD-10-CM | POA: Diagnosis not present

## 2012-03-13 DIAGNOSIS — M47817 Spondylosis without myelopathy or radiculopathy, lumbosacral region: Secondary | ICD-10-CM | POA: Diagnosis not present

## 2012-03-13 DIAGNOSIS — M4310 Spondylolisthesis, site unspecified: Secondary | ICD-10-CM | POA: Diagnosis not present

## 2012-03-14 ENCOUNTER — Ambulatory Visit
Admission: RE | Admit: 2012-03-14 | Discharge: 2012-03-14 | Disposition: A | Discharge status: Home / Self Care | Payer: Medicare Other | Source: Ambulatory Visit | Attending: Internal Medicine | Admitting: Internal Medicine

## 2012-03-14 DIAGNOSIS — Z1231 Encounter for screening mammogram for malignant neoplasm of breast: Secondary | ICD-10-CM

## 2012-03-19 DIAGNOSIS — M47817 Spondylosis without myelopathy or radiculopathy, lumbosacral region: Secondary | ICD-10-CM | POA: Diagnosis not present

## 2012-03-19 DIAGNOSIS — M545 Low back pain, unspecified: Secondary | ICD-10-CM | POA: Diagnosis not present

## 2012-03-19 DIAGNOSIS — M4310 Spondylolisthesis, site unspecified: Secondary | ICD-10-CM | POA: Diagnosis not present

## 2012-04-02 ENCOUNTER — Telehealth: Payer: Self-pay | Admitting: Pulmonary Disease

## 2012-04-02 MED ORDER — ROPINIROLE HCL 0.5 MG PO TABS: ORAL_TABLET | ORAL | Status: DC

## 2012-04-02 NOTE — Telephone Encounter (Signed)
Spoke with pt She is asking for 1 mo supply of requip Rx was sent to pharm  Nothing further needed per pt She will keep her April rov

## 2012-04-22 ENCOUNTER — Encounter: Payer: Self-pay | Admitting: Pulmonary Disease

## 2012-04-22 ENCOUNTER — Ambulatory Visit (INDEPENDENT_AMBULATORY_CARE_PROVIDER_SITE_OTHER): Payer: Medicare Other | Admitting: Pulmonary Disease

## 2012-04-22 VITALS — BP 108/68 | HR 69 | Temp 97.7°F | Ht 65.75 in | Wt 154.0 lb

## 2012-04-22 DIAGNOSIS — G2581 Restless legs syndrome: Secondary | ICD-10-CM

## 2012-04-22 MED ORDER — ROPINIROLE HCL 0.5 MG PO TABS: ORAL_TABLET | ORAL | Status: DC

## 2012-04-22 NOTE — Progress Notes (Signed)
  Subjective:    Patient ID: Elizabeth Espinoza, female    DOB: 1940-02-07, 72 y.o.   MRN: [id number]  HPI The patient comes in today for followup of her known RLS.  She has been doing very well on Requip, and sleeps well at night with excellent daytime alertness.  She does occasionally have some breakthrough symptoms in the late afternoon if she sits down to read or watch television.   Review of Systems  Constitutional: Negative for fever and unexpected weight change.  HENT: Positive for congestion, rhinorrhea, sneezing and postnasal drip. Negative for ear pain, nosebleeds, sore throat, trouble swallowing, dental problem and sinus pressure.   Eyes: Negative for redness and itching.  Respiratory: Positive for shortness of breath. Negative for cough, chest tightness and wheezing.   Cardiovascular: Negative for palpitations and leg swelling.  Gastrointestinal: Negative for nausea and vomiting.  Genitourinary: Negative for dysuria.  Musculoskeletal: Negative for joint swelling.  Skin: Negative for rash.  Neurological: Negative for headaches.  Hematological: Does not bruise/bleed easily.  Psychiatric/Behavioral: Negative for dysphoric mood. The patient is not nervous/anxious.        Objective:   Physical Exam Well-developed female in no acute distress Nose without purulence or discharge noted Neck without lymphadenopathy or thyromegaly Lower extremities without edema, no cyanosis Alert oriented, moves all 4 extremities.       Assessment & Plan:

## 2012-04-22 NOTE — Patient Instructions (Addendum)
Stay on requip, but you can split the dose as we discussed if you are going to be sitting down in the late afternoon. Stay as active as possible followup with me in one year.

## 2012-04-22 NOTE — Assessment & Plan Note (Signed)
The patient has done very well on her dopamine agonists for her RLS.  Her sleep is excellent, and she has satisfactory daytime alertness.  She is having a few breakthrough symptoms at times in the late afternoon, and I have told her that she can split her evening dose to include one in the late afternoon and the other near bedtime.  I've also reminded her to continue on her exercise program.

## 2012-04-26 ENCOUNTER — Other Ambulatory Visit: Payer: Self-pay | Admitting: Pulmonary Disease

## 2012-05-28 ENCOUNTER — Other Ambulatory Visit: Payer: Self-pay | Admitting: Pulmonary Disease

## 2012-05-28 MED ORDER — ROPINIROLE HCL 0.5 MG PO TABS: ORAL_TABLET | ORAL | Status: DC

## 2012-08-21 DIAGNOSIS — H04129 Dry eye syndrome of unspecified lacrimal gland: Secondary | ICD-10-CM | POA: Diagnosis not present

## 2012-08-21 DIAGNOSIS — H2510 Age-related nuclear cataract, unspecified eye: Secondary | ICD-10-CM | POA: Diagnosis not present

## 2012-08-21 DIAGNOSIS — H40019 Open angle with borderline findings, low risk, unspecified eye: Secondary | ICD-10-CM | POA: Diagnosis not present

## 2012-08-21 DIAGNOSIS — H1045 Other chronic allergic conjunctivitis: Secondary | ICD-10-CM | POA: Diagnosis not present

## 2012-08-27 ENCOUNTER — Other Ambulatory Visit: Payer: Self-pay

## 2012-08-27 DIAGNOSIS — L909 Atrophic disorder of skin, unspecified: Secondary | ICD-10-CM | POA: Diagnosis not present

## 2012-08-27 DIAGNOSIS — L819 Disorder of pigmentation, unspecified: Secondary | ICD-10-CM | POA: Diagnosis not present

## 2012-08-27 DIAGNOSIS — L821 Other seborrheic keratosis: Secondary | ICD-10-CM | POA: Diagnosis not present

## 2012-08-27 DIAGNOSIS — B079 Viral wart, unspecified: Secondary | ICD-10-CM | POA: Diagnosis not present

## 2012-08-27 DIAGNOSIS — L919 Hypertrophic disorder of the skin, unspecified: Secondary | ICD-10-CM | POA: Diagnosis not present

## 2012-08-27 DIAGNOSIS — D1801 Hemangioma of skin and subcutaneous tissue: Secondary | ICD-10-CM | POA: Diagnosis not present

## 2012-08-27 DIAGNOSIS — Q828 Other specified congenital malformations of skin: Secondary | ICD-10-CM | POA: Diagnosis not present

## 2012-10-23 ENCOUNTER — Telehealth: Payer: Self-pay | Admitting: *Deleted

## 2012-10-23 DIAGNOSIS — T887XXA Unspecified adverse effect of drug or medicament, initial encounter: Secondary | ICD-10-CM

## 2012-10-23 NOTE — Telephone Encounter (Signed)
Patient left message on triage line stating that she is having increased aching in the legs and feet and feels it may be related to pravastatin. Want to stop it to see if it helps with the pain. Chol 153, Trig 71 on 11/2011, okay to stop?

## 2012-10-23 NOTE — Telephone Encounter (Signed)
Spoke with pt, labwork scheduled for tomorrow 09/23/12 at [facility] per pt request with orders placed.

## 2012-10-23 NOTE — Telephone Encounter (Signed)
She should come in for a CK (995.20) before stopping the statin.  10-- 12 weeks off the statin she should repeat fasting lipids with a CK to help assess long-term risks.

## 2012-10-24 ENCOUNTER — Other Ambulatory Visit (INDEPENDENT_AMBULATORY_CARE_PROVIDER_SITE_OTHER): Payer: Medicare Other

## 2012-10-24 ENCOUNTER — Other Ambulatory Visit: Payer: Medicare Other

## 2012-10-24 DIAGNOSIS — T887XXA Unspecified adverse effect of drug or medicament, initial encounter: Secondary | ICD-10-CM | POA: Diagnosis not present

## 2012-10-24 LAB — CK: Total CK: 118 U/L (ref 7–177)

## 2012-10-27 DIAGNOSIS — Z23 Encounter for immunization: Secondary | ICD-10-CM | POA: Diagnosis not present

## 2012-11-27 ENCOUNTER — Other Ambulatory Visit: Payer: Self-pay

## 2012-12-09 ENCOUNTER — Other Ambulatory Visit: Payer: Self-pay | Admitting: Internal Medicine

## 2012-12-09 ENCOUNTER — Encounter: Payer: Self-pay | Admitting: Internal Medicine

## 2012-12-09 ENCOUNTER — Ambulatory Visit (INDEPENDENT_AMBULATORY_CARE_PROVIDER_SITE_OTHER): Payer: Medicare Other | Admitting: Internal Medicine

## 2012-12-09 ENCOUNTER — Ambulatory Visit (INDEPENDENT_AMBULATORY_CARE_PROVIDER_SITE_OTHER)
Admission: RE | Admit: 2012-12-09 | Discharge: 2012-12-09 | Disposition: A | Discharge status: Home / Self Care | Payer: Medicare Other | Source: Ambulatory Visit | Attending: Internal Medicine | Admitting: Internal Medicine

## 2012-12-09 VITALS — BP 116/70 | HR 72 | Temp 98.6°F | Ht 66.5 in | Wt 149.0 lb

## 2012-12-09 DIAGNOSIS — R5381 Other malaise: Secondary | ICD-10-CM

## 2012-12-09 DIAGNOSIS — J680 Bronchitis and pneumonitis due to chemicals, gases, fumes and vapors: Secondary | ICD-10-CM | POA: Diagnosis not present

## 2012-12-09 DIAGNOSIS — R071 Chest pain on breathing: Secondary | ICD-10-CM | POA: Diagnosis not present

## 2012-12-09 DIAGNOSIS — Z01818 Encounter for other preprocedural examination: Secondary | ICD-10-CM

## 2012-12-09 DIAGNOSIS — R042 Hemoptysis: Secondary | ICD-10-CM

## 2012-12-09 DIAGNOSIS — R5383 Other fatigue: Secondary | ICD-10-CM

## 2012-12-09 DIAGNOSIS — D649 Anemia, unspecified: Secondary | ICD-10-CM

## 2012-12-09 LAB — CBC WITH DIFFERENTIAL/PLATELET
Basophils Absolute: 0 10*3/uL (ref 0.0–0.1)
Eosinophils Absolute: 0.1 10*3/uL (ref 0.0–0.7)
Eosinophils Relative: 1 % (ref 0.0–5.0)
HCT: 34.1 % — ABNORMAL LOW (ref 36.0–46.0)
Hemoglobin: 11.6 g/dL — ABNORMAL LOW (ref 12.0–15.0)
Lymphocytes Relative: 15.5 % (ref 12.0–46.0)
Lymphs Abs: 1.6 10*3/uL (ref 0.7–4.0)
MCV: 88.6 fl (ref 78.0–100.0)
Monocytes Absolute: 0.9 10*3/uL (ref 0.1–1.0)
Monocytes Relative: 8.7 % (ref 3.0–12.0)
Neutro Abs: 7.9 10*3/uL — ABNORMAL HIGH (ref 1.4–7.7)
Neutrophils Relative %: 74.4 % (ref 43.0–77.0)
Platelets: 238 10*3/uL (ref 150.0–400.0)
RDW: 14.3 % (ref 11.5–14.6)

## 2012-12-09 MED ORDER — PREDNISONE 20 MG PO TABS: ORAL_TABLET | ORAL | Status: DC

## 2012-12-09 MED ORDER — FLUTICASONE-SALMETEROL 250-50 MCG/DOSE IN AEPB: 1.0000 | INHALATION_SPRAY | Freq: Two times a day (BID) | RESPIRATORY_TRACT | Status: DC

## 2012-12-09 NOTE — Progress Notes (Signed)
Pre visit review using our clinic review tool, if applicable. No additional management support is needed unless otherwise documented below in the visit note. 

## 2012-12-09 NOTE — Patient Instructions (Signed)
Order for x-rays entered into  the computer; these will be performed at [place]. across from [facility]. No appointment is necessary. Your next office appointment will be determined based upon review of your pending labs & or x-rays. Those instructions will be transmitted to you through My Chart . Please report any significant change in your symptoms.

## 2012-12-09 NOTE — Progress Notes (Signed)
  Subjective:    Patient ID: Elizabeth Espinoza, female    DOB: 22-Sep-1940, 72 y.o.   MRN: [id number]  HPI  Symptoms began 12/05/12 after exposure to some chemical sprayed on a couch. She noted some shortness of breath or wheezing at that time.  Socially she had some discomfort in her neck and throat which has become a "scratchy throat".  On 11/15 she noted fatigue and malaise. Her respiratory symptoms persisted through 11/16 as well.  She has had some chills & fatigue. Today she did spit up some blood, 1 tsp of white, thick sputum with some blood. The cough has been described as mainly hacking.  She has no history of asthma but has noted similar symptoms when exposed to her husband's spray deodorant. She's never smoked    Review of Systems She's had some exertional dyspnea but denies any chest pain or palpitations.  She also denies extrinsic symptoms of itchy, watery eyes, sneezing.  She has no fever or sweats despite the history of chills  There no symptoms of frontal headache, facial pain, nasal purulence, dental pain, otic pain, or otic discharge.  She denies epistaxis, hematuria, melena, or rectal bleeding.She has no unexplained weight loss or abdominal pain. She has no abnormal bruising or bleeding. She has no difficulty stopping bleeding with injury.    Objective:   Physical Exam General appearance: thin but good health ;well nourished; no acute distress or increased work of breathing is present.  No  lymphadenopathy about the head, neck, or axilla noted. Appears younger than stated age  Eyes: No conjunctival inflammation or lid edema is present. There is no scleral icterus.  Ears:  External ear exam shows no significant lesions or deformities.  Otoscopic examination reveals clear canals, tympanic membranes are intact bilaterally without bulging, retraction, inflammation or discharge.  Nose:  External nasal examination shows no deformity or inflammation. Nasal mucosa are pink and  moist without lesions or exudates. No septal dislocation or deviation.No obstruction to airflow.   Oral exam: Dental hygiene is good; lips and gums are healthy appearing.There is no oropharyngeal erythema or exudate noted.   Neck:  No deformities, thyromegaly, masses, or tenderness noted.    Heart:  Normal rate and regular rhythm. S1 and S2 normal without gallop, murmur, click, rub or other extra sounds.  S4  Lungs:Chest clear to auscultation; no wheezes, rhonchi,rales ,or rubs present.No increased work of breathing.    Extremities:  No cyanosis, edema, or clubbing  noted . Homan's negative   All pulses intact without  bruits .No ischemic skin changes.    Skin: Warm & dry w/o jaundice or tenting.         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

## 2012-12-10 ENCOUNTER — Telehealth: Payer: Self-pay | Admitting: Internal Medicine

## 2012-12-10 ENCOUNTER — Other Ambulatory Visit (INDEPENDENT_AMBULATORY_CARE_PROVIDER_SITE_OTHER): Payer: Medicare Other

## 2012-12-10 DIAGNOSIS — D649 Anemia, unspecified: Secondary | ICD-10-CM | POA: Diagnosis not present

## 2012-12-10 DIAGNOSIS — Z79899 Other long term (current) drug therapy: Secondary | ICD-10-CM | POA: Diagnosis not present

## 2012-12-10 DIAGNOSIS — Z01818 Encounter for other preprocedural examination: Secondary | ICD-10-CM | POA: Diagnosis not present

## 2012-12-10 LAB — IBC PANEL: Iron: 25 ug/dL — ABNORMAL LOW (ref 42–145)

## 2012-12-10 LAB — CBC WITH DIFFERENTIAL/PLATELET
Basophils Relative: 0.5 % (ref 0.0–3.0)
Eosinophils Absolute: 0.2 10*3/uL (ref 0.0–0.7)
Eosinophils Relative: 2.7 % (ref 0.0–5.0)
HCT: 33 % — ABNORMAL LOW (ref 36.0–46.0)
Hemoglobin: 11.1 g/dL — ABNORMAL LOW (ref 12.0–15.0)
Lymphs Abs: 1.8 10*3/uL (ref 0.7–4.0)
MCHC: 33.8 g/dL (ref 30.0–36.0)
MCV: 88.1 fl (ref 78.0–100.0)
Monocytes Absolute: 0.9 10*3/uL (ref 0.1–1.0)
Neutro Abs: 5.8 10*3/uL (ref 1.4–7.7)
Neutrophils Relative %: 66.5 % (ref 43.0–77.0)
RBC: 3.75 Mil/uL — ABNORMAL LOW (ref 3.87–5.11)
WBC: 8.7 10*3/uL (ref 4.5–10.5)

## 2012-12-10 LAB — CREATININE, SERUM: Creatinine, Ser: 0.7 mg/dL (ref 0.4–1.2)

## 2012-12-10 LAB — BUN: BUN: 11 mg/dL (ref 6–23)

## 2012-12-10 NOTE — Telephone Encounter (Signed)
With the rectal bleed she should be seen

## 2012-12-10 NOTE — Telephone Encounter (Signed)
Patient states that when she was seen yesterday, Dr. Linna Darner asked her if she had had blood in her stool and she says that she had not. Patient however, states that last night she did see bright red blood in her stool and wanted to let Dr. Linna Darner know.  Patient also wants to know what she needs to do in order to proceed with the kidney function tests that Dr. Linna Darner is recommending. Please advise.

## 2012-12-10 NOTE — Telephone Encounter (Signed)
Please advise 

## 2012-12-11 ENCOUNTER — Encounter: Payer: Self-pay | Admitting: Internal Medicine

## 2012-12-11 ENCOUNTER — Ambulatory Visit (INDEPENDENT_AMBULATORY_CARE_PROVIDER_SITE_OTHER): Payer: Medicare Other | Admitting: Internal Medicine

## 2012-12-11 VITALS — BP 113/66 | HR 76 | Temp 99.0°F | Resp 12 | Ht 65.5 in | Wt 149.4 lb

## 2012-12-11 DIAGNOSIS — J849 Interstitial pulmonary disease, unspecified: Secondary | ICD-10-CM | POA: Insufficient documentation

## 2012-12-11 DIAGNOSIS — R9389 Abnormal findings on diagnostic imaging of other specified body structures: Secondary | ICD-10-CM

## 2012-12-11 DIAGNOSIS — R918 Other nonspecific abnormal finding of lung field: Secondary | ICD-10-CM

## 2012-12-11 DIAGNOSIS — K625 Hemorrhage of anus and rectum: Secondary | ICD-10-CM

## 2012-12-11 DIAGNOSIS — K2270 Barrett's esophagus without dysplasia: Secondary | ICD-10-CM | POA: Diagnosis not present

## 2012-12-11 DIAGNOSIS — D649 Anemia, unspecified: Secondary | ICD-10-CM | POA: Diagnosis not present

## 2012-12-11 NOTE — Assessment & Plan Note (Signed)
Referred to Dr. Collene Mares

## 2012-12-11 NOTE — Progress Notes (Signed)
Pre visit review using our clinic review tool, if applicable. No additional management support is needed unless otherwise documented below in the visit note.

## 2012-12-11 NOTE — Assessment & Plan Note (Signed)
Refer to Dr Collene Mares as per patient request

## 2012-12-11 NOTE — Telephone Encounter (Signed)
Called and spoke with patient. Appt scheduled for 1:30 11.20.14 with Dr. Linna Darner. JG//CMA

## 2012-12-11 NOTE — Progress Notes (Signed)
  Subjective:    Patient ID: Elizabeth Espinoza, female    DOB: September 27, 1940, 72 y.o.   MRN: [id number]  HPI   She had painless rectal bleeding 12/09/12 noted as blood in the toilet water. This has not been progressive but today she had a small amount of blood with some minor internal discomfort.  She has a history of internal hemorrhoids. Her last colonoscopy was in 2005.  She's had 2 upper endoscopic procedures with biopsies, the most recent was in 2013.  Her followup CBC and differential reveals a hematocrit of 33; prior value was 34.1. Platelet count remains normal. B12 level was normal; iron levels are decreased.  Kidney function tests are normal.    Review of Systems  She has had increased coughing with thick sputum. The previously noted hemoptysis has essentially resolved. She has not filled the prednisone. There was a question of a RUL lesion on the chest x-ray. The x-rays were visualized. She does have some increased interstitial markings. There is a very faint 1 cm change in the right upper lobe; this is an area of overlapping ribs.     Objective:   Physical Exam General appearance : thin but in good health and nourishment w/o distress.  Eyes: No conjunctival inflammation or scleral icterus is present.  Oral exam: Dental hygiene is good; lips and gums are healthy appearing.There is no oropharyngeal erythema or exudate noted.   Heart:  Normal rate and regular rhythm. S1 and S2 normal without gallop, murmur, click, rub or other extra sounds     Lungs:Chest clear to auscultation; no wheezes, rhonchi,rales ,or rubs present.No increased work of breathing.   Abdomen: bowel sounds normal, soft and non-tender without masses, organomegaly or hernias noted.  No guarding or rebound . Aorta palpable ; no AAA  Musculoskeletal: Able to lie flat and sit up without help.  Skin:Warm & dry.  Intact without suspicious lesions or rashes ; no jaundice or tenting  Lymphatic: No lymphadenopathy is  noted about the head, neck, axilla.                Assessment & Plan:  #1 rectal bleeding, painless, not progressive  #2 mild anemia, slightly progressive with low iron levels  #3 possible pulmonary nodule right upper lobe and nonsmoker  Plan:#1 GI consultation  #2 CT scan of the chest to define the possible nodule.

## 2012-12-11 NOTE — Patient Instructions (Signed)
Share results with all non [facility] medical staff seen

## 2012-12-11 NOTE — Assessment & Plan Note (Signed)
CT chest scan

## 2012-12-12 ENCOUNTER — Other Ambulatory Visit: Payer: Self-pay | Admitting: Physician Assistant

## 2012-12-12 ENCOUNTER — Ambulatory Visit (HOSPITAL_BASED_OUTPATIENT_CLINIC_OR_DEPARTMENT_OTHER)
Admission: RE | Admit: 2012-12-12 | Discharge: 2012-12-12 | Disposition: A | Discharge status: Home / Self Care | Payer: Medicare Other | Source: Ambulatory Visit | Attending: Internal Medicine | Admitting: Internal Medicine

## 2012-12-12 DIAGNOSIS — J984 Other disorders of lung: Secondary | ICD-10-CM | POA: Insufficient documentation

## 2012-12-12 DIAGNOSIS — R9389 Abnormal findings on diagnostic imaging of other specified body structures: Secondary | ICD-10-CM | POA: Diagnosis not present

## 2012-12-30 DIAGNOSIS — K625 Hemorrhage of anus and rectum: Secondary | ICD-10-CM | POA: Diagnosis not present

## 2012-12-30 DIAGNOSIS — Z1211 Encounter for screening for malignant neoplasm of colon: Secondary | ICD-10-CM | POA: Diagnosis not present

## 2012-12-30 DIAGNOSIS — D509 Iron deficiency anemia, unspecified: Secondary | ICD-10-CM | POA: Diagnosis not present

## 2013-01-22 HISTORY — PX: COLONOSCOPY: SHX174

## 2013-01-22 HISTORY — PX: UPPER GI ENDOSCOPY: SHX6162

## 2013-02-20 DIAGNOSIS — K219 Gastro-esophageal reflux disease without esophagitis: Secondary | ICD-10-CM | POA: Diagnosis not present

## 2013-02-20 DIAGNOSIS — K2940 Chronic atrophic gastritis without bleeding: Secondary | ICD-10-CM | POA: Diagnosis not present

## 2013-02-20 DIAGNOSIS — D131 Benign neoplasm of stomach: Secondary | ICD-10-CM | POA: Diagnosis not present

## 2013-02-20 DIAGNOSIS — K319 Disease of stomach and duodenum, unspecified: Secondary | ICD-10-CM | POA: Diagnosis not present

## 2013-02-20 DIAGNOSIS — D509 Iron deficiency anemia, unspecified: Secondary | ICD-10-CM | POA: Diagnosis not present

## 2013-02-20 DIAGNOSIS — K209 Esophagitis, unspecified without bleeding: Secondary | ICD-10-CM | POA: Diagnosis not present

## 2013-02-20 DIAGNOSIS — Z1211 Encounter for screening for malignant neoplasm of colon: Secondary | ICD-10-CM | POA: Diagnosis not present

## 2013-02-20 DIAGNOSIS — K625 Hemorrhage of anus and rectum: Secondary | ICD-10-CM | POA: Diagnosis not present

## 2013-02-20 DIAGNOSIS — K2270 Barrett's esophagus without dysplasia: Secondary | ICD-10-CM | POA: Diagnosis not present

## 2013-02-23 ENCOUNTER — Other Ambulatory Visit: Payer: Self-pay

## 2013-02-23 DIAGNOSIS — Z1231 Encounter for screening mammogram for malignant neoplasm of breast: Secondary | ICD-10-CM

## 2013-03-05 DIAGNOSIS — K219 Gastro-esophageal reflux disease without esophagitis: Secondary | ICD-10-CM | POA: Diagnosis not present

## 2013-03-05 DIAGNOSIS — K2270 Barrett's esophagus without dysplasia: Secondary | ICD-10-CM | POA: Diagnosis not present

## 2013-03-05 DIAGNOSIS — K449 Diaphragmatic hernia without obstruction or gangrene: Secondary | ICD-10-CM | POA: Diagnosis not present

## 2013-03-08 ENCOUNTER — Encounter: Payer: Self-pay | Admitting: Internal Medicine

## 2013-03-18 ENCOUNTER — Ambulatory Visit: Payer: Medicare Other

## 2013-03-27 ENCOUNTER — Ambulatory Visit
Admission: RE | Admit: 2013-03-27 | Discharge: 2013-03-27 | Disposition: A | Discharge status: Home / Self Care | Payer: Medicare Other | Source: Ambulatory Visit

## 2013-03-27 DIAGNOSIS — Z1231 Encounter for screening mammogram for malignant neoplasm of breast: Secondary | ICD-10-CM

## 2013-04-01 ENCOUNTER — Encounter: Payer: Self-pay | Admitting: Internal Medicine

## 2013-04-01 NOTE — Progress Notes (Signed)
Patient has changed GI care from Dr Olevia Perches to Dr Meriel Pica. She saw Dr Collene Mares in the office on 12/30/12.

## 2013-04-17 ENCOUNTER — Other Ambulatory Visit: Payer: Self-pay

## 2013-04-17 MED ORDER — LEVOTHYROXINE SODIUM 100 MCG PO TABS: ORAL_TABLET | ORAL | Status: DC

## 2013-04-17 NOTE — Telephone Encounter (Signed)
rx refilled for unithroid 100 mcg

## 2013-04-22 ENCOUNTER — Encounter: Payer: Self-pay | Admitting: Pulmonary Disease

## 2013-04-22 ENCOUNTER — Ambulatory Visit (INDEPENDENT_AMBULATORY_CARE_PROVIDER_SITE_OTHER): Payer: Medicare Other | Admitting: Pulmonary Disease

## 2013-04-22 VITALS — BP 112/72 | HR 79 | Temp 97.7°F | Ht 66.0 in | Wt 154.0 lb

## 2013-04-22 DIAGNOSIS — G2581 Restless legs syndrome: Secondary | ICD-10-CM

## 2013-04-22 NOTE — Patient Instructions (Signed)
Will send a note to Dr. Linna Darner to see if he will consider starting you on iron replacement therapy. Can increase requip to 3 tabs a day.  Experiment with taking one in afternoon and 2 after dinner, versus 2 in afternoon and one after dinner. followup with me in one year, but call if you are not getting better control of your symptoms.

## 2013-04-22 NOTE — Progress Notes (Signed)
   Subjective:    Patient ID: Elizabeth Espinoza, female    DOB: May 13, 1940, 73 y.o.   MRN: [id number]  HPI The patient comes in today for followup of her restless leg syndrome. She has been treated with Requip, but has seen worsening of her symptoms over the last one year. She has especially seen this in the afternoons, despite splitting her dose one in the afternoon and one after dinner. She is continuing to exercise. It should be noted that she had an iron panel in November of last year, and this showed iron deficiency.   Review of Systems  Constitutional: Negative for fever and unexpected weight change.  HENT: Negative for congestion, dental problem, ear pain, nosebleeds, postnasal drip, rhinorrhea, sinus pressure, sneezing, sore throat and trouble swallowing.   Eyes: Negative for redness and itching.  Respiratory: Negative for cough, chest tightness, shortness of breath and wheezing.   Cardiovascular: Negative for palpitations and leg swelling.  Gastrointestinal: Negative for nausea and vomiting.  Genitourinary: Negative for dysuria.  Musculoskeletal: Negative for joint swelling.  Skin: Negative for rash.  Neurological: Negative for headaches.  Hematological: Does not bruise/bleed easily.  Psychiatric/Behavioral: Negative for dysphoric mood. The patient is not nervous/anxious.        Objective:   Physical Exam Well-developed female in no acute distress Nose without purulence or discharge noted Neck without lymphadenopathy or thyromegaly Lower extremities without edema, no cyanosis Alert and oriented, moves all 4 extremities       Assessment & Plan:

## 2013-04-22 NOTE — Assessment & Plan Note (Signed)
The patient feels that her restless leg symptoms have been worsening over the last one year, and is becoming more of an issue earlier in the day. She is currently taking one Requip in the afternoon and one after dinner, and feels that her symptoms are not totally controlled. It should be noted that she had an iron panel in November of last year, and this showed decreased levels. I have explained to the patient and iron deficiency can frequently worsen or cause restless leg syndrome. I will increase her Requip dosage, but also see if her primary physician can start her on iron replacement therapy. She will let me know if she does not have an improved response.

## 2013-06-08 ENCOUNTER — Telehealth: Payer: Self-pay | Admitting: Pulmonary Disease

## 2013-06-08 MED ORDER — ROPINIROLE HCL 0.5 MG PO TABS: ORAL_TABLET | ORAL | Status: DC

## 2013-06-08 NOTE — Telephone Encounter (Signed)
See avs from last note.  She can experiment with requip 3 tabs a day (see avs).  Also, the most important treatment is to get her iron deficience addressed.  This is a cause of worsening RLS.

## 2013-06-08 NOTE — Telephone Encounter (Signed)
Called spoke with pt. She will have her iron def addressed tomorrow by PCP at [facility]. She reports taking 2 requip is fine for her. RX has been sent. Nothing further needed

## 2013-06-08 NOTE — Telephone Encounter (Signed)
Called spoke with pt. She has checked with insurance regarding Mirapex and requip. Their is not much difference in priced. About $8 difference. Mirapex is less expensive. She reports [facility] advised her to call back with this information. She takes requip 1 tab around dinner and 1 around bedtime. She still is having problems with restless legs around dinner time. Does not feel her restless legs have worsened since last OV in April. If [facility] wants her to stay on requip she will need new RX. She has not had any side effects from the requip. Please advise Dr. Gwenette Greet thanks

## 2013-06-09 ENCOUNTER — Ambulatory Visit (INDEPENDENT_AMBULATORY_CARE_PROVIDER_SITE_OTHER): Payer: Medicare Other | Admitting: Internal Medicine

## 2013-06-09 ENCOUNTER — Encounter: Payer: Self-pay | Admitting: Internal Medicine

## 2013-06-09 ENCOUNTER — Other Ambulatory Visit (INDEPENDENT_AMBULATORY_CARE_PROVIDER_SITE_OTHER): Payer: Medicare Other

## 2013-06-09 VITALS — BP 116/70 | HR 85 | Temp 98.3°F | Ht 66.0 in | Wt 150.6 lb

## 2013-06-09 DIAGNOSIS — E782 Mixed hyperlipidemia: Secondary | ICD-10-CM

## 2013-06-09 DIAGNOSIS — D649 Anemia, unspecified: Secondary | ICD-10-CM

## 2013-06-09 DIAGNOSIS — E039 Hypothyroidism, unspecified: Secondary | ICD-10-CM

## 2013-06-09 DIAGNOSIS — R209 Unspecified disturbances of skin sensation: Secondary | ICD-10-CM | POA: Diagnosis not present

## 2013-06-09 DIAGNOSIS — R202 Paresthesia of skin: Secondary | ICD-10-CM

## 2013-06-09 LAB — CBC WITH DIFFERENTIAL/PLATELET
Basophils Absolute: 0 10*3/uL (ref 0.0–0.1)
Basophils Relative: 0.5 % (ref 0.0–3.0)
EOS PCT: 3.5 % (ref 0.0–5.0)
Eosinophils Absolute: 0.3 10*3/uL (ref 0.0–0.7)
HEMATOCRIT: 39.8 % (ref 36.0–46.0)
Hemoglobin: 13.1 g/dL (ref 12.0–15.0)
LYMPHS ABS: 3 10*3/uL (ref 0.7–4.0)
Lymphocytes Relative: 35.1 % (ref 12.0–46.0)
MCHC: 33 g/dL (ref 30.0–36.0)
MCV: 89.7 fl (ref 78.0–100.0)
MONO ABS: 0.7 10*3/uL (ref 0.1–1.0)
Monocytes Relative: 8 % (ref 3.0–12.0)
Neutro Abs: 4.5 10*3/uL (ref 1.4–7.7)
Neutrophils Relative %: 52.9 % (ref 43.0–77.0)
PLATELETS: 225 10*3/uL (ref 150.0–400.0)
RBC: 4.44 Mil/uL (ref 3.87–5.11)
RDW: 14.6 % (ref 11.5–15.5)
WBC: 8.4 10*3/uL (ref 4.0–10.5)

## 2013-06-09 LAB — LIPID PANEL
CHOLESTEROL: 232 mg/dL — AB (ref 0–200)
HDL: 53.2 mg/dL (ref >39.00)
LDL Cholesterol: 156 mg/dL — ABNORMAL HIGH (ref 0–99)
Total CHOL/HDL Ratio: 4
Triglycerides: 112 mg/dL (ref 0.0–149.0)
VLDL: 22.4 mg/dL (ref 0.0–40.0)

## 2013-06-09 LAB — HEPATIC FUNCTION PANEL
ALT: 16 U/L (ref 0–35)
AST: 24 U/L (ref 0–37)
Albumin: 4.6 g/dL (ref 3.5–5.2)
Alkaline Phosphatase: 63 U/L (ref 39–117)
Bilirubin, Direct: 0.1 mg/dL (ref 0.0–0.3)
Total Bilirubin: 1 mg/dL (ref 0.2–1.2)
Total Protein: 7.3 g/dL (ref 6.0–8.3)

## 2013-06-09 LAB — BASIC METABOLIC PANEL
BUN: 14 mg/dL (ref 6–23)
CHLORIDE: 104 meq/L (ref 96–112)
CO2: 29 meq/L (ref 19–32)
Calcium: 9.9 mg/dL (ref 8.4–10.5)
Creatinine, Ser: 0.8 mg/dL (ref 0.4–1.2)
GFR: 71.8 mL/min (ref >60.00)
GLUCOSE: 91 mg/dL (ref 70–99)
POTASSIUM: 4.9 meq/L (ref 3.5–5.1)
Sodium: 141 mEq/L (ref 135–145)

## 2013-06-09 LAB — IBC PANEL
Iron: 70 ug/dL (ref 42–145)
Saturation Ratios: 18.2 % — ABNORMAL LOW (ref 20.0–50.0)
TRANSFERRIN: 274 mg/dL (ref 212.0–360.0)

## 2013-06-09 LAB — TSH: TSH: 0.82 u[IU]/mL (ref 0.35–4.50)

## 2013-06-09 MED ORDER — LEVOTHYROXINE SODIUM 100 MCG PO TABS: ORAL_TABLET | ORAL | Status: DC

## 2013-06-09 MED ORDER — GABAPENTIN 100 MG PO CAPS: ORAL_CAPSULE | ORAL | Status: DC

## 2013-06-09 NOTE — Progress Notes (Signed)
Pre visit review using our clinic review tool, if applicable. No additional management support is needed unless otherwise documented below in the visit note.

## 2013-06-09 NOTE — Assessment & Plan Note (Signed)
TSH

## 2013-06-09 NOTE — Progress Notes (Signed)
Subjective:    Patient ID: Elizabeth Espinoza, female    DOB: 13-Apr-1940, 73 y.o.   MRN: [id number]  HPI She is here to assess active health issues & conditions. PMH, FH, & Social history verified & updated   A modified heart healthy diet is followed; exercise encompasses 60-90 or > minutes 5  times per week as gym or walking without symptoms.  Family history is negative for premature coronary disease. Advanced cholesterol testing reveals  LDL goal is less than 100 ; ideally < 70 . There is medication non compliance with the statin. She felt the medication was causing numbness and tingling in her feet as well some muscle discomfort. She discontinued it in October 2014. Low dose ASA not taken due to GERD  Review of Systems  Specifically denied are  chest pain, palpitations, dyspnea, or claudication.     She denies unexplained weight loss, abdominal pain, significant dyspepsia, dysphagia, melena, rectal bleeding, or persistently small caliber stools. Colonoscopy UTD.  She has numbness and tingling of the distal third of both feet. This is not improved off statin.  She questions her iron status as Dr. Gwenette Greet mentioned iron deficiency as the most common cause of restless leg syndrome. He has prescribed medicine for restless legs with good response     Objective:   Physical Exam   She is thin and appears younger than her stated age. S4 without murmur or gallop. Dorsalis pedis pulses are decreased. There are no ischemic changes of feet.  Gen.: Healthy and well-nourished appearance. Alert, appropriate and cooperative throughout exam.  Head: Normocephalic without obvious abnormalities Eyes: No corneal or conjunctival inflammation noted. Pupils equal round reactive to light and accommodation. Extraocular motion intact.  Ears: External  ear exam reveals no significant lesions or deformities. Canals clear .TMs normal. Hearing is grossly slightly decreased R ear. Nose: External nasal exam  reveals no deformity or inflammation. Nasal mucosa are pink and moist. No lesions or exudates noted.   Mouth: Oral mucosa and oropharynx reveal no lesions or exudates. Teeth in good repair. Neck: No deformities, masses, or tenderness noted. Range of motion & Thyroid normal. Lungs: Normal respiratory effort; chest expands symmetrically. Lungs are clear to auscultation without rales, wheezes, or increased work of breathing. Heart: Normal rate and rhythm. Normal S1 and S2. No gallop, click, or rub.  Abdomen: Bowel sounds normal; abdomen soft and nontender. No masses, organomegaly or hernias noted. Genitalia: as per Gyn                                  Musculoskeletal/extremities: No deformity or scoliosis noted of  the thoracic or lumbar spine.  No clubbing, cyanosis, edema, or significant extremity  deformity noted. Range of motion normal .Tone & strength normal. Hand joints normal   Fingernail  health good. Able to lie down & sit up w/o help. Negative SLR bilaterally Vascular: Carotid, radial artery, dorsalis pedis and  posterior tibial pulses are full and equal. No bruits present. Neurologic: Alert and oriented x3. Deep tendon reflexes symmetrical and normal.  Gait normal   Skin: Intact without suspicious lesions or rashes. Lymph: No cervical, axillary lymphadenopathy present. Psych: Mood and affect are normal. Normally interactive  Assessment & Plan:  See Current Assessment & Plan in Problem List under specific DiagnosisThe labs will be reviewed and risks and options assessed. Written recommendations will be provided by mail or directly through My Chart.Further evaluation or change in medical therapy will be directed by those results.  New issue: Numbness and tingling in the feet. She had attributed this to statin but it persists off the medication. This was discontinued in October 2014 by her.

## 2013-06-09 NOTE — Patient Instructions (Signed)
Your next office appointment will be determined based upon review of your pending labs. Those instructions will be transmitted to you through My Chart .

## 2013-06-09 NOTE — Assessment & Plan Note (Signed)
CBC & dif Iron panel

## 2013-06-09 NOTE — Assessment & Plan Note (Signed)
Lipids, LFTs, TSH ,CK

## 2013-06-10 ENCOUNTER — Telehealth: Payer: Self-pay | Admitting: *Deleted

## 2013-06-10 NOTE — Telephone Encounter (Signed)
Left msg on triage stating saw md yesterday he rx gabapentin & not sure how to take. Called pt back inform her per his notes should take 1 capsule every 8 hours PRN, if still have sxs after 72 hours can increase and take 2. Pt states she will try to see how it works she will run out because he only gave her 30 pills....Johny Chess

## 2013-09-08 DIAGNOSIS — M999 Biomechanical lesion, unspecified: Secondary | ICD-10-CM | POA: Diagnosis not present

## 2013-09-08 DIAGNOSIS — M545 Low back pain, unspecified: Secondary | ICD-10-CM | POA: Diagnosis not present

## 2013-09-08 DIAGNOSIS — IMO0002 Reserved for concepts with insufficient information to code with codable children: Secondary | ICD-10-CM | POA: Diagnosis not present

## 2013-09-08 DIAGNOSIS — M546 Pain in thoracic spine: Secondary | ICD-10-CM | POA: Diagnosis not present

## 2013-09-08 DIAGNOSIS — M533 Sacrococcygeal disorders, not elsewhere classified: Secondary | ICD-10-CM | POA: Diagnosis not present

## 2013-09-14 DIAGNOSIS — M545 Low back pain, unspecified: Secondary | ICD-10-CM | POA: Diagnosis not present

## 2013-09-14 DIAGNOSIS — M533 Sacrococcygeal disorders, not elsewhere classified: Secondary | ICD-10-CM | POA: Diagnosis not present

## 2013-09-14 DIAGNOSIS — M999 Biomechanical lesion, unspecified: Secondary | ICD-10-CM | POA: Diagnosis not present

## 2013-09-14 DIAGNOSIS — M546 Pain in thoracic spine: Secondary | ICD-10-CM | POA: Diagnosis not present

## 2013-09-14 DIAGNOSIS — IMO0002 Reserved for concepts with insufficient information to code with codable children: Secondary | ICD-10-CM | POA: Diagnosis not present

## 2013-09-15 DIAGNOSIS — M545 Low back pain, unspecified: Secondary | ICD-10-CM | POA: Diagnosis not present

## 2013-09-15 DIAGNOSIS — M546 Pain in thoracic spine: Secondary | ICD-10-CM | POA: Diagnosis not present

## 2013-09-15 DIAGNOSIS — IMO0002 Reserved for concepts with insufficient information to code with codable children: Secondary | ICD-10-CM | POA: Diagnosis not present

## 2013-09-15 DIAGNOSIS — M999 Biomechanical lesion, unspecified: Secondary | ICD-10-CM | POA: Diagnosis not present

## 2013-09-15 DIAGNOSIS — M533 Sacrococcygeal disorders, not elsewhere classified: Secondary | ICD-10-CM | POA: Diagnosis not present

## 2013-09-17 DIAGNOSIS — M545 Low back pain, unspecified: Secondary | ICD-10-CM | POA: Diagnosis not present

## 2013-09-17 DIAGNOSIS — M999 Biomechanical lesion, unspecified: Secondary | ICD-10-CM | POA: Diagnosis not present

## 2013-09-17 DIAGNOSIS — M546 Pain in thoracic spine: Secondary | ICD-10-CM | POA: Diagnosis not present

## 2013-09-17 DIAGNOSIS — IMO0002 Reserved for concepts with insufficient information to code with codable children: Secondary | ICD-10-CM | POA: Diagnosis not present

## 2013-09-17 DIAGNOSIS — M533 Sacrococcygeal disorders, not elsewhere classified: Secondary | ICD-10-CM | POA: Diagnosis not present

## 2013-09-21 DIAGNOSIS — M546 Pain in thoracic spine: Secondary | ICD-10-CM | POA: Diagnosis not present

## 2013-09-21 DIAGNOSIS — IMO0002 Reserved for concepts with insufficient information to code with codable children: Secondary | ICD-10-CM | POA: Diagnosis not present

## 2013-09-21 DIAGNOSIS — M533 Sacrococcygeal disorders, not elsewhere classified: Secondary | ICD-10-CM | POA: Diagnosis not present

## 2013-09-21 DIAGNOSIS — M545 Low back pain, unspecified: Secondary | ICD-10-CM | POA: Diagnosis not present

## 2013-09-21 DIAGNOSIS — M999 Biomechanical lesion, unspecified: Secondary | ICD-10-CM | POA: Diagnosis not present

## 2013-09-22 DIAGNOSIS — M546 Pain in thoracic spine: Secondary | ICD-10-CM | POA: Diagnosis not present

## 2013-09-22 DIAGNOSIS — M545 Low back pain, unspecified: Secondary | ICD-10-CM | POA: Diagnosis not present

## 2013-09-22 DIAGNOSIS — IMO0002 Reserved for concepts with insufficient information to code with codable children: Secondary | ICD-10-CM | POA: Diagnosis not present

## 2013-09-22 DIAGNOSIS — M999 Biomechanical lesion, unspecified: Secondary | ICD-10-CM | POA: Diagnosis not present

## 2013-09-22 DIAGNOSIS — M533 Sacrococcygeal disorders, not elsewhere classified: Secondary | ICD-10-CM | POA: Diagnosis not present

## 2013-09-24 DIAGNOSIS — M545 Low back pain, unspecified: Secondary | ICD-10-CM | POA: Diagnosis not present

## 2013-09-24 DIAGNOSIS — M546 Pain in thoracic spine: Secondary | ICD-10-CM | POA: Diagnosis not present

## 2013-09-24 DIAGNOSIS — M999 Biomechanical lesion, unspecified: Secondary | ICD-10-CM | POA: Diagnosis not present

## 2013-09-24 DIAGNOSIS — M533 Sacrococcygeal disorders, not elsewhere classified: Secondary | ICD-10-CM | POA: Diagnosis not present

## 2013-09-24 DIAGNOSIS — IMO0002 Reserved for concepts with insufficient information to code with codable children: Secondary | ICD-10-CM | POA: Diagnosis not present

## 2013-09-29 DIAGNOSIS — M545 Low back pain, unspecified: Secondary | ICD-10-CM | POA: Diagnosis not present

## 2013-09-29 DIAGNOSIS — M999 Biomechanical lesion, unspecified: Secondary | ICD-10-CM | POA: Diagnosis not present

## 2013-09-29 DIAGNOSIS — IMO0002 Reserved for concepts with insufficient information to code with codable children: Secondary | ICD-10-CM | POA: Diagnosis not present

## 2013-09-29 DIAGNOSIS — M546 Pain in thoracic spine: Secondary | ICD-10-CM | POA: Diagnosis not present

## 2013-09-29 DIAGNOSIS — M533 Sacrococcygeal disorders, not elsewhere classified: Secondary | ICD-10-CM | POA: Diagnosis not present

## 2013-09-30 DIAGNOSIS — H04129 Dry eye syndrome of unspecified lacrimal gland: Secondary | ICD-10-CM | POA: Diagnosis not present

## 2013-09-30 DIAGNOSIS — H1045 Other chronic allergic conjunctivitis: Secondary | ICD-10-CM | POA: Diagnosis not present

## 2013-09-30 DIAGNOSIS — H40039 Anatomical narrow angle, unspecified eye: Secondary | ICD-10-CM | POA: Diagnosis not present

## 2013-09-30 DIAGNOSIS — H2510 Age-related nuclear cataract, unspecified eye: Secondary | ICD-10-CM | POA: Diagnosis not present

## 2013-09-30 DIAGNOSIS — H40019 Open angle with borderline findings, low risk, unspecified eye: Secondary | ICD-10-CM | POA: Diagnosis not present

## 2013-10-01 DIAGNOSIS — M546 Pain in thoracic spine: Secondary | ICD-10-CM | POA: Diagnosis not present

## 2013-10-01 DIAGNOSIS — M533 Sacrococcygeal disorders, not elsewhere classified: Secondary | ICD-10-CM | POA: Diagnosis not present

## 2013-10-01 DIAGNOSIS — IMO0002 Reserved for concepts with insufficient information to code with codable children: Secondary | ICD-10-CM | POA: Diagnosis not present

## 2013-10-01 DIAGNOSIS — M999 Biomechanical lesion, unspecified: Secondary | ICD-10-CM | POA: Diagnosis not present

## 2013-10-01 DIAGNOSIS — M545 Low back pain, unspecified: Secondary | ICD-10-CM | POA: Diagnosis not present

## 2013-10-02 DIAGNOSIS — M546 Pain in thoracic spine: Secondary | ICD-10-CM | POA: Diagnosis not present

## 2013-10-02 DIAGNOSIS — IMO0002 Reserved for concepts with insufficient information to code with codable children: Secondary | ICD-10-CM | POA: Diagnosis not present

## 2013-10-02 DIAGNOSIS — M533 Sacrococcygeal disorders, not elsewhere classified: Secondary | ICD-10-CM | POA: Diagnosis not present

## 2013-10-02 DIAGNOSIS — M999 Biomechanical lesion, unspecified: Secondary | ICD-10-CM | POA: Diagnosis not present

## 2013-10-02 DIAGNOSIS — M545 Low back pain, unspecified: Secondary | ICD-10-CM | POA: Diagnosis not present

## 2013-10-05 DIAGNOSIS — M546 Pain in thoracic spine: Secondary | ICD-10-CM | POA: Diagnosis not present

## 2013-10-05 DIAGNOSIS — M533 Sacrococcygeal disorders, not elsewhere classified: Secondary | ICD-10-CM | POA: Diagnosis not present

## 2013-10-05 DIAGNOSIS — IMO0002 Reserved for concepts with insufficient information to code with codable children: Secondary | ICD-10-CM | POA: Diagnosis not present

## 2013-10-05 DIAGNOSIS — M999 Biomechanical lesion, unspecified: Secondary | ICD-10-CM | POA: Diagnosis not present

## 2013-10-05 DIAGNOSIS — M545 Low back pain, unspecified: Secondary | ICD-10-CM | POA: Diagnosis not present

## 2013-10-06 DIAGNOSIS — M545 Low back pain, unspecified: Secondary | ICD-10-CM | POA: Diagnosis not present

## 2013-10-06 DIAGNOSIS — IMO0002 Reserved for concepts with insufficient information to code with codable children: Secondary | ICD-10-CM | POA: Diagnosis not present

## 2013-10-06 DIAGNOSIS — M999 Biomechanical lesion, unspecified: Secondary | ICD-10-CM | POA: Diagnosis not present

## 2013-10-06 DIAGNOSIS — M533 Sacrococcygeal disorders, not elsewhere classified: Secondary | ICD-10-CM | POA: Diagnosis not present

## 2013-10-06 DIAGNOSIS — M546 Pain in thoracic spine: Secondary | ICD-10-CM | POA: Diagnosis not present

## 2013-10-08 DIAGNOSIS — M533 Sacrococcygeal disorders, not elsewhere classified: Secondary | ICD-10-CM | POA: Diagnosis not present

## 2013-10-08 DIAGNOSIS — M545 Low back pain, unspecified: Secondary | ICD-10-CM | POA: Diagnosis not present

## 2013-10-08 DIAGNOSIS — M999 Biomechanical lesion, unspecified: Secondary | ICD-10-CM | POA: Diagnosis not present

## 2013-10-08 DIAGNOSIS — IMO0002 Reserved for concepts with insufficient information to code with codable children: Secondary | ICD-10-CM | POA: Diagnosis not present

## 2013-10-08 DIAGNOSIS — M546 Pain in thoracic spine: Secondary | ICD-10-CM | POA: Diagnosis not present

## 2013-10-20 DIAGNOSIS — M545 Low back pain, unspecified: Secondary | ICD-10-CM | POA: Diagnosis not present

## 2013-10-20 DIAGNOSIS — IMO0002 Reserved for concepts with insufficient information to code with codable children: Secondary | ICD-10-CM | POA: Diagnosis not present

## 2013-10-20 DIAGNOSIS — M546 Pain in thoracic spine: Secondary | ICD-10-CM | POA: Diagnosis not present

## 2013-10-20 DIAGNOSIS — M999 Biomechanical lesion, unspecified: Secondary | ICD-10-CM | POA: Diagnosis not present

## 2013-10-20 DIAGNOSIS — M533 Sacrococcygeal disorders, not elsewhere classified: Secondary | ICD-10-CM | POA: Diagnosis not present

## 2013-10-21 DIAGNOSIS — M999 Biomechanical lesion, unspecified: Secondary | ICD-10-CM | POA: Diagnosis not present

## 2013-10-21 DIAGNOSIS — M545 Low back pain, unspecified: Secondary | ICD-10-CM | POA: Diagnosis not present

## 2013-10-21 DIAGNOSIS — IMO0002 Reserved for concepts with insufficient information to code with codable children: Secondary | ICD-10-CM | POA: Diagnosis not present

## 2013-10-21 DIAGNOSIS — M533 Sacrococcygeal disorders, not elsewhere classified: Secondary | ICD-10-CM | POA: Diagnosis not present

## 2013-10-21 DIAGNOSIS — M546 Pain in thoracic spine: Secondary | ICD-10-CM | POA: Diagnosis not present

## 2013-10-27 DIAGNOSIS — M9902 Segmental and somatic dysfunction of thoracic region: Secondary | ICD-10-CM | POA: Diagnosis not present

## 2013-10-27 DIAGNOSIS — M9904 Segmental and somatic dysfunction of sacral region: Secondary | ICD-10-CM | POA: Diagnosis not present

## 2013-10-27 DIAGNOSIS — M9905 Segmental and somatic dysfunction of pelvic region: Secondary | ICD-10-CM | POA: Diagnosis not present

## 2013-10-27 DIAGNOSIS — M9903 Segmental and somatic dysfunction of lumbar region: Secondary | ICD-10-CM | POA: Diagnosis not present

## 2013-10-27 DIAGNOSIS — M546 Pain in thoracic spine: Secondary | ICD-10-CM | POA: Diagnosis not present

## 2013-10-27 DIAGNOSIS — M5442 Lumbago with sciatica, left side: Secondary | ICD-10-CM | POA: Diagnosis not present

## 2013-10-29 DIAGNOSIS — M9903 Segmental and somatic dysfunction of lumbar region: Secondary | ICD-10-CM | POA: Diagnosis not present

## 2013-10-29 DIAGNOSIS — M5442 Lumbago with sciatica, left side: Secondary | ICD-10-CM | POA: Diagnosis not present

## 2013-10-29 DIAGNOSIS — M546 Pain in thoracic spine: Secondary | ICD-10-CM | POA: Diagnosis not present

## 2013-10-29 DIAGNOSIS — M9902 Segmental and somatic dysfunction of thoracic region: Secondary | ICD-10-CM | POA: Diagnosis not present

## 2013-10-29 DIAGNOSIS — M9905 Segmental and somatic dysfunction of pelvic region: Secondary | ICD-10-CM | POA: Diagnosis not present

## 2013-10-29 DIAGNOSIS — M9904 Segmental and somatic dysfunction of sacral region: Secondary | ICD-10-CM | POA: Diagnosis not present

## 2013-11-03 ENCOUNTER — Telehealth: Payer: Self-pay

## 2013-11-03 DIAGNOSIS — M9905 Segmental and somatic dysfunction of pelvic region: Secondary | ICD-10-CM | POA: Diagnosis not present

## 2013-11-03 DIAGNOSIS — M546 Pain in thoracic spine: Secondary | ICD-10-CM | POA: Diagnosis not present

## 2013-11-03 DIAGNOSIS — M9903 Segmental and somatic dysfunction of lumbar region: Secondary | ICD-10-CM | POA: Diagnosis not present

## 2013-11-03 DIAGNOSIS — M9904 Segmental and somatic dysfunction of sacral region: Secondary | ICD-10-CM | POA: Diagnosis not present

## 2013-11-03 DIAGNOSIS — M5442 Lumbago with sciatica, left side: Secondary | ICD-10-CM | POA: Diagnosis not present

## 2013-11-03 DIAGNOSIS — M9902 Segmental and somatic dysfunction of thoracic region: Secondary | ICD-10-CM | POA: Diagnosis not present

## 2013-11-03 MED ORDER — PRAVASTATIN SODIUM 40 MG PO TABS: 40.0000 mg | ORAL_TABLET | Freq: Every day | ORAL | Status: DC

## 2013-11-03 NOTE — Telephone Encounter (Signed)
OK #30 with 2 refills Fasting labs after 10 weeks

## 2013-11-03 NOTE — Telephone Encounter (Signed)
Received a refill request for Pravastatin 40 mg#90 take one tablet daily. I do not see this on present medication list

## 2013-11-05 DIAGNOSIS — Z23 Encounter for immunization: Secondary | ICD-10-CM | POA: Diagnosis not present

## 2013-11-05 DIAGNOSIS — M9904 Segmental and somatic dysfunction of sacral region: Secondary | ICD-10-CM | POA: Diagnosis not present

## 2013-11-05 DIAGNOSIS — M9903 Segmental and somatic dysfunction of lumbar region: Secondary | ICD-10-CM | POA: Diagnosis not present

## 2013-11-05 DIAGNOSIS — M9905 Segmental and somatic dysfunction of pelvic region: Secondary | ICD-10-CM | POA: Diagnosis not present

## 2013-11-05 DIAGNOSIS — M9902 Segmental and somatic dysfunction of thoracic region: Secondary | ICD-10-CM | POA: Diagnosis not present

## 2013-11-05 DIAGNOSIS — M546 Pain in thoracic spine: Secondary | ICD-10-CM | POA: Diagnosis not present

## 2013-11-05 DIAGNOSIS — M5442 Lumbago with sciatica, left side: Secondary | ICD-10-CM | POA: Diagnosis not present

## 2013-11-24 ENCOUNTER — Other Ambulatory Visit: Payer: Self-pay

## 2013-11-24 MED ORDER — PRAVASTATIN SODIUM 40 MG PO TABS: 40.0000 mg | ORAL_TABLET | Freq: Every day | ORAL | Status: DC

## 2014-02-09 ENCOUNTER — Other Ambulatory Visit: Payer: Self-pay | Admitting: Internal Medicine

## 2014-02-09 ENCOUNTER — Telehealth: Payer: Self-pay | Admitting: Internal Medicine

## 2014-02-09 DIAGNOSIS — E782 Mixed hyperlipidemia: Secondary | ICD-10-CM

## 2014-02-09 NOTE — Telephone Encounter (Signed)
Orders entered

## 2014-02-09 NOTE — Telephone Encounter (Signed)
Pt is wondering if she should be getting her labs done, she is on pravastatin and it has been 10 weeks? Pls leave voicemail.

## 2014-02-09 NOTE — Telephone Encounter (Signed)
Patient has been advised lab orders are placed.

## 2014-02-10 ENCOUNTER — Other Ambulatory Visit (INDEPENDENT_AMBULATORY_CARE_PROVIDER_SITE_OTHER): Payer: Medicare Other

## 2014-02-10 DIAGNOSIS — E782 Mixed hyperlipidemia: Secondary | ICD-10-CM

## 2014-02-10 LAB — CK: CK TOTAL: 92 U/L (ref 7–177)

## 2014-02-10 LAB — HEPATIC FUNCTION PANEL
ALK PHOS: 62 U/L (ref 39–117)
ALT: 13 U/L (ref 0–35)
AST: 22 U/L (ref 0–37)
Albumin: 4.3 g/dL (ref 3.5–5.2)
BILIRUBIN DIRECT: 0.1 mg/dL (ref 0.0–0.3)
BILIRUBIN TOTAL: 0.7 mg/dL (ref 0.2–1.2)
TOTAL PROTEIN: 6.7 g/dL (ref 6.0–8.3)

## 2014-02-10 LAB — LIPID PANEL
CHOL/HDL RATIO: 3
Cholesterol: 146 mg/dL (ref 0–200)
HDL: 50.3 mg/dL (ref >39.00)
LDL Cholesterol: 77 mg/dL (ref 0–99)
NonHDL: 95.7
Triglycerides: 96 mg/dL (ref 0.0–149.0)
VLDL: 19.2 mg/dL (ref 0.0–40.0)

## 2014-02-23 ENCOUNTER — Other Ambulatory Visit: Payer: Self-pay

## 2014-02-23 MED ORDER — PRAVASTATIN SODIUM 40 MG PO TABS: 40.0000 mg | ORAL_TABLET | Freq: Every day | ORAL | Status: DC

## 2014-02-26 ENCOUNTER — Other Ambulatory Visit: Payer: Self-pay | Admitting: Internal Medicine

## 2014-04-28 ENCOUNTER — Ambulatory Visit: Payer: Medicare Other | Admitting: Pulmonary Disease

## 2014-05-05 ENCOUNTER — Encounter: Payer: Self-pay | Admitting: Pulmonary Disease

## 2014-05-05 ENCOUNTER — Ambulatory Visit (INDEPENDENT_AMBULATORY_CARE_PROVIDER_SITE_OTHER): Payer: Medicare Other | Admitting: Pulmonary Disease

## 2014-05-05 VITALS — BP 98/60 | HR 66 | Temp 97.9°F | Ht 66.0 in | Wt 153.0 lb

## 2014-05-05 DIAGNOSIS — G2581 Restless legs syndrome: Secondary | ICD-10-CM

## 2014-05-05 MED ORDER — ROPINIROLE HCL 0.5 MG PO TABS: ORAL_TABLET | ORAL | Status: DC

## 2014-05-05 NOTE — Patient Instructions (Signed)
Stay on requip at the lowest effective dose for you that keeps symptoms controlled.  Will send in refill for you. If your symptoms suddenly worsen, think about your iron levels being an issue again.  followup in one year.

## 2014-05-05 NOTE — Assessment & Plan Note (Signed)
The patient currently is doing very well from a restless leg standpoint on 1 Requip a day. She has found that regular exercise definitely helps, and her last iron panel actually appeared fairly normal. I have asked her to continue on her Requip in the lowest effective dose, and she can take anywhere from 1-3 each day if needed. Also stressed to her the importance of daily exercise, as well as keeping an eye on her iron panel if she has sudden worsening of her symptoms.

## 2014-05-05 NOTE — Progress Notes (Signed)
   Subjective:    Patient ID: Elizabeth Espinoza, female    DOB: 26-Aug-1940, 74 y.o.   MRN: [id number]  HPI The patient comes in today for follow-up of her restless leg syndrome. She has been maintained on Requip in varying doses, and currently is doing extremely well on 1 a day. She does have a history of iron deficiency, but her most recent panel was actually normal. She has also noticed that when she exercises regularly, her symptoms are much better.   Review of Systems  Constitutional: Negative for fever and unexpected weight change.  HENT: Positive for sneezing. Negative for congestion, dental problem, ear pain, nosebleeds, postnasal drip, rhinorrhea, sinus pressure, sore throat and trouble swallowing.        Allergies  Eyes: Negative for redness and itching.  Respiratory: Positive for shortness of breath. Negative for cough, chest tightness and wheezing.   Cardiovascular: Negative for palpitations and leg swelling.  Gastrointestinal: Negative for nausea and vomiting.  Genitourinary: Negative for dysuria.  Musculoskeletal: Negative for joint swelling.  Skin: Negative for rash.  Neurological: Positive for headaches ( sinus HA).  Hematological: Does not bruise/bleed easily.  Psychiatric/Behavioral: Negative for dysphoric mood. The patient is not nervous/anxious.        Objective:   Physical Exam Well-developed female in no acute distress Nose without purulence or discharge noted Lower extremities without edema, no cyanosis Alert and oriented, does not appear to be sleepy, moves all 4 extremities.       Assessment & Plan:

## 2014-05-06 ENCOUNTER — Other Ambulatory Visit: Payer: Self-pay

## 2014-05-06 DIAGNOSIS — Z1231 Encounter for screening mammogram for malignant neoplasm of breast: Secondary | ICD-10-CM

## 2014-05-21 ENCOUNTER — Ambulatory Visit
Admission: RE | Admit: 2014-05-21 | Discharge: 2014-05-21 | Disposition: A | Discharge status: Home / Self Care | Payer: Medicare Other | Source: Ambulatory Visit

## 2014-05-21 DIAGNOSIS — Z1231 Encounter for screening mammogram for malignant neoplasm of breast: Secondary | ICD-10-CM

## 2014-06-11 ENCOUNTER — Telehealth: Payer: Self-pay

## 2014-06-11 ENCOUNTER — Ambulatory Visit (INDEPENDENT_AMBULATORY_CARE_PROVIDER_SITE_OTHER): Payer: Medicare Other | Admitting: Internal Medicine

## 2014-06-11 ENCOUNTER — Other Ambulatory Visit: Payer: Self-pay

## 2014-06-11 ENCOUNTER — Other Ambulatory Visit (INDEPENDENT_AMBULATORY_CARE_PROVIDER_SITE_OTHER): Payer: Medicare Other

## 2014-06-11 ENCOUNTER — Telehealth: Payer: Self-pay | Admitting: Internal Medicine

## 2014-06-11 ENCOUNTER — Encounter: Payer: Self-pay | Admitting: Internal Medicine

## 2014-06-11 VITALS — BP 122/70 | HR 68 | Temp 97.6°F | Wt 154.5 lb

## 2014-06-11 DIAGNOSIS — E782 Mixed hyperlipidemia: Secondary | ICD-10-CM | POA: Diagnosis not present

## 2014-06-11 DIAGNOSIS — E039 Hypothyroidism, unspecified: Secondary | ICD-10-CM

## 2014-06-11 DIAGNOSIS — R5383 Other fatigue: Secondary | ICD-10-CM

## 2014-06-11 DIAGNOSIS — R739 Hyperglycemia, unspecified: Secondary | ICD-10-CM

## 2014-06-11 DIAGNOSIS — J310 Chronic rhinitis: Secondary | ICD-10-CM

## 2014-06-11 DIAGNOSIS — K219 Gastro-esophageal reflux disease without esophagitis: Secondary | ICD-10-CM

## 2014-06-11 LAB — BASIC METABOLIC PANEL
BUN: 15 mg/dL (ref 6–23)
CALCIUM: 9.9 mg/dL (ref 8.4–10.5)
CO2: 29 mEq/L (ref 19–32)
Chloride: 104 mEq/L (ref 96–112)
Creatinine, Ser: 0.78 mg/dL (ref 0.40–1.20)
GFR: 76.92 mL/min (ref >60.00)
Glucose, Bld: 100 mg/dL — ABNORMAL HIGH (ref 70–99)
POTASSIUM: 4.4 meq/L (ref 3.5–5.1)
SODIUM: 140 meq/L (ref 135–145)

## 2014-06-11 LAB — CBC WITH DIFFERENTIAL/PLATELET
Basophils Absolute: 0.2 10*3/uL — ABNORMAL HIGH (ref 0.0–0.1)
Basophils Relative: 2.1 % (ref 0.0–3.0)
EOS PCT: 4.2 % (ref 0.0–5.0)
Eosinophils Absolute: 0.3 10*3/uL (ref 0.0–0.7)
HCT: 40.8 % (ref 36.0–46.0)
HEMOGLOBIN: 13.8 g/dL (ref 12.0–15.0)
LYMPHS ABS: 2.7 10*3/uL (ref 0.7–4.0)
LYMPHS PCT: 33.6 % (ref 12.0–46.0)
MCHC: 33.8 g/dL (ref 30.0–36.0)
MCV: 88.1 fl (ref 78.0–100.0)
MONOS PCT: 7.6 % (ref 3.0–12.0)
Monocytes Absolute: 0.6 10*3/uL (ref 0.1–1.0)
NEUTROS ABS: 4.2 10*3/uL (ref 1.4–7.7)
Neutrophils Relative %: 52.5 % (ref 43.0–77.0)
Platelets: 183 10*3/uL (ref 150.0–400.0)
RBC: 4.63 Mil/uL (ref 3.87–5.11)
RDW: 14.3 % (ref 11.5–15.5)
WBC: 7.9 10*3/uL (ref 4.0–10.5)

## 2014-06-11 LAB — HEMOGLOBIN A1C: Hgb A1c MFr Bld: 5.8 % (ref 4.6–6.5)

## 2014-06-11 LAB — TSH: TSH: 3.45 u[IU]/mL (ref 0.35–4.50)

## 2014-06-11 MED ORDER — PRAVASTATIN SODIUM 40 MG PO TABS: ORAL_TABLET | ORAL | Status: DC

## 2014-06-11 NOTE — Telephone Encounter (Signed)
[facility]

## 2014-06-11 NOTE — Telephone Encounter (Signed)
Patient was in today and Dr. Linna Darner was supposed to send pravastatin (PRAVACHOL) 40 MG tablet [[id number] [facility] [address]. She only has 1 pill left. Pharmacy has not received it yet

## 2014-06-11 NOTE — Telephone Encounter (Signed)
-----  Message from Hendricks Limes, MD sent at 06/11/2014  4:03 PM EDT ----- Please add A1c (R73.9)

## 2014-06-11 NOTE — Patient Instructions (Addendum)
  Your next office appointment will be determined based upon review of your pending labs  and  xrays  Those instructions will be transmitted to you by My Chart  Critical results will be called.  Followup as needed for any active or acute issue. Please report any significant change in your symptoms.  Plain Mucinex (NOT D) for thick secretions ;force NON dairy fluids .   Nasal cleansing in the shower as discussed with lather of mild shampoo.After 10 seconds wash off lather while  exhaling through nostrils. Make sure that all residual soap is removed to prevent irritation.  Flonase OR Nasacort AQ 1 spray in each nostril twice a day as needed. Use the "crossover" technique into opposite nostril spraying toward opposite ear @ 45 degree angle, not straight up into nostril.  Plain Allegra (NOT D )  160 daily , Loratidine 10 mg , OR Zyrtec 10 mg @ bedtime  as needed for itchy eyes & sneezing.  Reflux of gastric acid may be asymptomatic as this may occur mainly during sleep.The triggers for reflux  include stress; the "aspirin family" ; alcohol; peppermint; and caffeine (coffee, tea, cola, and chocolate). The aspirin family would include aspirin and the nonsteroidal agents such as ibuprofen &  Naproxen. Tylenol would not cause reflux. If having symptoms ; food & drink should be avoided for @ least 2 hours before going to bed.

## 2014-06-11 NOTE — Progress Notes (Signed)
Pre visit review using our clinic review tool, if applicable. No additional management support is needed unless otherwise documented below in the visit note. 

## 2014-06-11 NOTE — Progress Notes (Signed)
   Subjective:    Patient ID: Elizabeth Espinoza, female    DOB: Jun 03, 1940, 74 y.o.   MRN: [id number]  HPI The patient is here to assess status of active health conditions.  PMH, FH, & Social History reviewed & updated.  One brother had myocardial infarction and one brother has had 3 strokes. Both were over 55 at the onset of symptoms.  She has been compliant with her medicines without adverse effects. She's on a heart healthy diet. She exercises at least 3 times a week as fitness training and walking without cardiopulmonary symptoms except for exertional dyspnea going up hills.  Colonoscopy was performed January 2015 by Dr. Collene Mares. She does have thin stools but this has been present for years. She attributes this to eating increased amounts of fruits in her diet. She has no other GI symptoms.  Over the last week she has had postnasal drainage without significant extrinsic symptoms. This occurred after putting out pine needles. She has no upper respiratory tract infection symptoms.  She has had some fatigue.She has had some loss of hair and eyebrows.She has some heat intolerance.  Review of Systems  Chest pain, palpitations, tachycardia,  paroxysmal nocturnal dyspnea, claudication or edema are absent.  Unexplained weight loss, abdominal pain, significant dyspepsia, dysphagia, melena, or rectal bleeding are denied.  No significant sleep disorder; change in appetite;weight change. No blurred, double ,loss of vision No palpitations; racing; irregularity No constipation; diarrhea;hoarseness. No change in nails or skin No numbness or tingling; tremor No anxiety; depression; panic attacks No temperature intolerance to cold other than related to her Raynaud's syndrome.Marland Kitchen  Her restless leg syndromes symptoms have responded to Requip.     Objective:   Physical Exam  Pertinent or positive findings include:. There is marked erythema of the nasal mucosa/septa. The aorta is palpable without  aneurysm.  Feet are cool.  Dorsalis pedis pulses are decreased.  She has isolated flexion contractures of the toes greatest of the second left toe.  General appearance :Thin but adequately nourished; in no distress. Eyes: No conjunctival inflammation or scleral icterus is present. Oral exam:  Lips and gums are healthy appearing.There is no oropharyngeal erythema or exudate noted. Dental hygiene is good. Heart:  Normal rate and regular rhythm. S1 and S2 normal without gallop, murmur, click, rub or other extra sounds   Lungs:Chest clear to auscultation; no wheezes, rhonchi,rales ,or rubs present.No increased work of breathing.  Abdomen: bowel sounds normal, soft and non-tender without masses, organomegaly or hernias noted.  No guarding or rebound.  Vascular : all pulses equal ; no bruits present. Skin:Warm & dry.  Intact without suspicious lesions or rashes ; no tenting or jaundice  Lymphatic: No lymphadenopathy is noted about the head, neck, axilla Neuro: Strength, tone & DTRs normal.        Assessment & Plan:  See Current Assessment & Plan in Problem List under specific Diagnosis

## 2014-06-11 NOTE — Telephone Encounter (Signed)
Faxed to lab

## 2014-06-13 NOTE — Assessment & Plan Note (Addendum)
Lipids current & @ goal BMET

## 2014-06-13 NOTE — Assessment & Plan Note (Signed)
TSH

## 2014-06-13 NOTE — Assessment & Plan Note (Signed)
CBC & dif  Anti reflux measures

## 2014-06-22 ENCOUNTER — Other Ambulatory Visit: Payer: Self-pay

## 2014-06-22 ENCOUNTER — Telehealth: Payer: Self-pay | Admitting: Internal Medicine

## 2014-06-22 MED ORDER — AMOXICILLIN 500 MG PO CAPS: 500.0000 mg | ORAL_CAPSULE | Freq: Three times a day (TID) | ORAL | Status: DC

## 2014-06-22 NOTE — Telephone Encounter (Signed)
Pt is still having a hard time with her sinuses.  She was seen last week and still having issues.  She is still really dizzy.  Can Dr hop call anything in for this pt?     Best number [phone]

## 2014-06-22 NOTE — Telephone Encounter (Signed)
Amoxicillin rx sent to pharm

## 2014-06-22 NOTE — Telephone Encounter (Signed)
Amox 500 mg tid #21

## 2014-06-22 NOTE — Telephone Encounter (Signed)
Please advise, thanks

## 2014-06-24 ENCOUNTER — Other Ambulatory Visit: Payer: Self-pay | Admitting: Internal Medicine

## 2014-07-02 ENCOUNTER — Encounter: Payer: Self-pay | Admitting: Internal Medicine

## 2014-09-15 ENCOUNTER — Ambulatory Visit (INDEPENDENT_AMBULATORY_CARE_PROVIDER_SITE_OTHER): Payer: Medicare Other | Admitting: Internal Medicine

## 2014-09-15 ENCOUNTER — Encounter: Payer: Self-pay | Admitting: Internal Medicine

## 2014-09-15 VITALS — BP 134/78 | HR 57 | Temp 97.9°F | Resp 16 | Wt 154.0 lb

## 2014-09-15 DIAGNOSIS — R0609 Other forms of dyspnea: Secondary | ICD-10-CM | POA: Diagnosis not present

## 2014-09-15 DIAGNOSIS — J209 Acute bronchitis, unspecified: Secondary | ICD-10-CM

## 2014-09-15 NOTE — Patient Instructions (Signed)
Continue the nasal hygiene as previously discussed.  Please return after resolution of the acute infection for evaluation of the exertional shortness of breath

## 2014-09-15 NOTE — Progress Notes (Signed)
   Subjective:    Patient ID: Elizabeth Espinoza, female    DOB: 1940/02/09, 74 y.o.   MRN: [id number]  HPI  Symptoms began 09/11/14 as a cough with clear sputum. The cough was intermittent but worse at night. As of 8/23 she began to have yellow/green sputum. The cough per se is not associated with shortness of breath or wheezing but she has unrelated exertional dyspnea particularly with hills or stairs for the last 6-8 months.  She has had some associated frontal and facial discomfort as well as paranasal pressure. This is associated with postnasal drainage. On 8/24 she has developed some blurred vision. She has scant nasal discharge which was yellow. She does have some itchy, watery eyes and sneezing.    Review of Systems  She denies fever, chills, or sweats. She's had no sore throat, otic pain, otic discharge.     Objective:   Physical Exam  General appearance:Adequately nourished; no acute distress or increased work of breathing is present.    Lymphatic: No  lymphadenopathy about the head, neck, or axilla .  Eyes: No conjunctival inflammation or lid edema is present. There is no scleral icterus.EOM & vision intact  Ears:  External ear exam shows no significant lesions or deformities.  Otoscopic examination reveals clear canals, tympanic membranes are intact bilaterally without bulging, retraction, inflammation or discharge.  Nose:  External nasal examination shows no deformity or inflammation. Nasal mucosa are erythematous without lesions or exudates No septal dislocation or deviation.No obstruction to airflow.   Oral exam: Dental hygiene is good; lips and gums are healthy appearing.There is no oropharyngeal erythema or exudate .  Neck:  No deformities, thyromegaly, masses, or tenderness noted.   Supple with full range of motion without pain.   Heart:  Normal rate and regular rhythm. S1 and S2 normal without gallop, murmur, click, rub or other extra sounds.   Lungs:Chest clear to  auscultation; no wheezes, rhonchi,rales ,or rubs present.  Extremities:  No cyanosis, edema, or clubbing  noted    Skin: Warm & dry w/o tenting or jaundice. No significant lesions or rash.       Assessment & Plan:  #1 acute bronchitis  #2 minor upper respiratory tract infection  #3 exertional dyspnea; an anginal equivalent must be ruled out. Unfortunately EKG cannot be done due to [facility] system failure this afternoon.  She's been asked not to exercise until this can be evaluated after resolution of the acute process

## 2014-09-15 NOTE — Progress Notes (Signed)
Pre visit review using our clinic review tool, if applicable. No additional management support is needed unless otherwise documented below in the visit note. 

## 2014-09-29 ENCOUNTER — Ambulatory Visit (INDEPENDENT_AMBULATORY_CARE_PROVIDER_SITE_OTHER)
Admission: RE | Admit: 2014-09-29 | Discharge: 2014-09-29 | Disposition: A | Discharge status: Home / Self Care | Payer: Medicare Other | Source: Ambulatory Visit | Attending: Internal Medicine | Admitting: Internal Medicine

## 2014-09-29 ENCOUNTER — Other Ambulatory Visit: Payer: Self-pay | Admitting: Internal Medicine

## 2014-09-29 ENCOUNTER — Ambulatory Visit (INDEPENDENT_AMBULATORY_CARE_PROVIDER_SITE_OTHER): Payer: Medicare Other | Admitting: Internal Medicine

## 2014-09-29 ENCOUNTER — Encounter: Payer: Self-pay | Admitting: Internal Medicine

## 2014-09-29 VITALS — BP 120/78 | HR 53 | Temp 98.5°F | Resp 16 | Wt 153.0 lb

## 2014-09-29 DIAGNOSIS — R0600 Dyspnea, unspecified: Secondary | ICD-10-CM

## 2014-09-29 DIAGNOSIS — R0609 Other forms of dyspnea: Secondary | ICD-10-CM | POA: Insufficient documentation

## 2014-09-29 DIAGNOSIS — R938 Abnormal findings on diagnostic imaging of other specified body structures: Secondary | ICD-10-CM

## 2014-09-29 DIAGNOSIS — R9389 Abnormal findings on diagnostic imaging of other specified body structures: Secondary | ICD-10-CM

## 2014-09-29 DIAGNOSIS — E782 Mixed hyperlipidemia: Secondary | ICD-10-CM

## 2014-09-29 NOTE — Progress Notes (Signed)
Subjective:    Patient ID: Elizabeth Espinoza, female    DOB: [birth year], 74 y.o.   MRN: [id number]  HPI  She describes exertional dyspnea in the last several months after it became extremely hot. This occurs after climbing 5-6 stairs or going uphill. Also intermittently for several months she notices some heaviness @ the base of her neck. She describes this as a sensation of "not a deep enough breath". She denies any definite exertional chest pain but sometimes can have lightheadedness with exertion.   She has dyslipidemia with an LDL goal less than 100. She's been compliant with her statin. She is on a heart healthy diet.   Her brother had a heart attack at 6. Another brother had a stroke in his 35s. Paternal grandfather had stroke over the age of 53.  She's never smoked. She was exposed to secondhand smoke.  In 1989 she had a negative cardiac catheterization.Subsequent to that she did have a stress test which was negative. Her chest discomfort was attributed to reflux.  In 2014 she was incidentally found to have a 1 cm nodule in the right upper lobe. CT scan suggested apical scarring; no  mass was noted.  Review of Systems  There is no significant cough, sputum production,hemoptysis, wheezing,or  paroxysmal nocturnal dyspnea. Unexplained weight loss, abdominal pain, significant dyspepsia,  melena, rectal bleeding, or persistently small caliber stools are not present.She can have mild dysphagia with cornbread.  Extrinsic symptoms of itchy, watery eyes, or sneezing are chronic and mild. Triggers include perfume and flower exposure..      Objective:   Physical Exam  General appearance : Thin but adequately nourished; in no distress.  Eyes: No conjunctival inflammation or scleral icterus is present.  Oral exam:  Lips and gums are healthy appearing.There is no oropharyngeal erythema or exudate noted. Dental hygiene is good.  Heart:  Normal rate and regular rhythm. S1 and S2 normal  without gallop, murmur, click, rub or other extra sounds    Lungs:Chest clear to auscultation; no wheezes, rhonchi ,or rubs present. ? Minor rales.No increased work of breathing.   Abdomen: bowel sounds normal, soft and non-tender without masses, organomegaly or hernias noted.  No guarding or rebound.   Vascular : Dorsalis pedis pulses are slightly decreased; all pulses equal ; no bruits present. Homans sign negative  Skin:Warm & dry.  Intact without suspicious lesions or rashes ; no tenting or jaundice   Lymphatic: No lymphadenopathy is noted about the head, neck, axilla.   Neuro: Strength, tone & DTRs normal.     Assessment & Plan:  #1 dyspnea  #2 past history of abnormal chest x-ray suggesting possible alveolitis  #3 minor nonspecific T changes compared to 02/11/12. No definite ischemic changes. 1 PVC, not previously seen.  Plan: See orders and recommendations.  Repeat stress test be necessary. If this is negative point function tests would be performed.

## 2014-09-29 NOTE — Patient Instructions (Addendum)
  Your next office appointment will be determined based upon review of your pending xrays  Those written interpretation of the lab results and instructions will be transmitted to you by My Chart   Critical results will be called.   Followup as needed for any active or acute issue. Please report any significant change in your symptoms.  To prevent palpitations or premature beats, avoid stimulants such as decongestants, diet pills, nicotine, or caffeine (coffee, tea, cola, or chocolate) to excess.

## 2014-09-29 NOTE — Progress Notes (Signed)
Pre visit review using our clinic review tool, if applicable. No additional management support is needed unless otherwise documented below in the visit note.

## 2014-10-05 DIAGNOSIS — H40013 Open angle with borderline findings, low risk, bilateral: Secondary | ICD-10-CM | POA: Diagnosis not present

## 2014-10-05 DIAGNOSIS — H2513 Age-related nuclear cataract, bilateral: Secondary | ICD-10-CM | POA: Diagnosis not present

## 2014-10-05 DIAGNOSIS — H10413 Chronic giant papillary conjunctivitis, bilateral: Secondary | ICD-10-CM | POA: Diagnosis not present

## 2014-10-05 DIAGNOSIS — H04123 Dry eye syndrome of bilateral lacrimal glands: Secondary | ICD-10-CM | POA: Diagnosis not present

## 2014-10-08 ENCOUNTER — Telehealth (HOSPITAL_COMMUNITY): Payer: Self-pay

## 2014-10-08 NOTE — Telephone Encounter (Signed)
Encounter complete. 

## 2014-10-13 ENCOUNTER — Ambulatory Visit (HOSPITAL_COMMUNITY)
Admission: RE | Admit: 2014-10-13 | Discharge: 2014-10-13 | Disposition: A | Discharge status: Home / Self Care | Payer: Medicare Other | Source: Ambulatory Visit | Attending: Internal Medicine | Admitting: Internal Medicine

## 2014-10-13 DIAGNOSIS — R0609 Other forms of dyspnea: Secondary | ICD-10-CM | POA: Diagnosis not present

## 2014-10-13 LAB — EXERCISE TOLERANCE TEST
CHL CUP MPHR: 147 {beats}/min
CHL CUP RESTING HR STRESS: 52 {beats}/min
CHL CUP STRESS STAGE 2 HR: 64 {beats}/min
CHL CUP STRESS STAGE 2 SPEED: 1 mph
CHL CUP STRESS STAGE 3 HR: 64 {beats}/min
CHL CUP STRESS STAGE 5 DBP: 74 mmHg
CHL CUP STRESS STAGE 5 GRADE: 12 %
CHL CUP STRESS STAGE 5 SPEED: 2.5 mph
CHL CUP STRESS STAGE 6 GRADE: 14 %
CHL CUP STRESS STAGE 6 HR: 148 {beats}/min
CHL CUP STRESS STAGE 6 SPEED: 3.4 mph
CHL CUP STRESS STAGE 7 GRADE: 16 %
CHL CUP STRESS STAGE 8 GRADE: 0 %
CHL CUP STRESS STAGE 9 SPEED: 0 mph
CHL RATE OF PERCEIVED EXERTION: 16
CSEPED: 9 min
CSEPEW: 12.1 METS
CSEPHR: 104 %
CSEPPHR: 153 {beats}/min
Exercise duration (sec): 15 s
Percent of predicted max HR: 104 %
Stage 1 DBP: 73 mmHg
Stage 1 Grade: 0 %
Stage 1 HR: 70 {beats}/min
Stage 1 SBP: 125 mmHg
Stage 1 Speed: 0 mph
Stage 2 Grade: 0 %
Stage 3 Grade: 0.1 %
Stage 3 Speed: 1 mph
Stage 4 DBP: 63 mmHg
Stage 4 Grade: 10 %
Stage 4 HR: 107 {beats}/min
Stage 4 SBP: 142 mmHg
Stage 4 Speed: 1.7 mph
Stage 5 HR: 129 {beats}/min
Stage 5 SBP: 153 mmHg
Stage 6 DBP: 100 mmHg
Stage 6 SBP: 157 mmHg
Stage 7 HR: 153 {beats}/min
Stage 7 Speed: 4.2 mph
Stage 8 HR: 144 {beats}/min
Stage 8 Speed: 0 mph
Stage 9 DBP: 76 mmHg
Stage 9 Grade: 0 %
Stage 9 HR: 80 {beats}/min
Stage 9 SBP: 125 mmHg

## 2014-11-05 ENCOUNTER — Telehealth: Payer: Self-pay | Admitting: Internal Medicine

## 2014-11-05 NOTE — Telephone Encounter (Signed)
[facility] on [address] called regarding pts levothyroxine (SYNTHROID, LEVOTHROID) 100 MCG tablet [[id number]]   [facility] state [facility] in the past, but use a different manufacturer. They are wanting to know if they can change manufacturers.  Please advise

## 2014-11-09 ENCOUNTER — Encounter: Payer: Self-pay | Admitting: *Deleted

## 2014-11-12 ENCOUNTER — Other Ambulatory Visit: Payer: Self-pay

## 2014-11-12 MED ORDER — LEVOTHYROXINE SODIUM 100 MCG PO TABS: ORAL_TABLET | ORAL | Status: DC

## 2014-11-12 NOTE — Telephone Encounter (Signed)
erx done.

## 2014-11-24 DIAGNOSIS — Z23 Encounter for immunization: Secondary | ICD-10-CM | POA: Diagnosis not present

## 2014-12-31 DIAGNOSIS — L814 Other melanin hyperpigmentation: Secondary | ICD-10-CM | POA: Diagnosis not present

## 2014-12-31 DIAGNOSIS — L820 Inflamed seborrheic keratosis: Secondary | ICD-10-CM | POA: Diagnosis not present

## 2014-12-31 DIAGNOSIS — L308 Other specified dermatitis: Secondary | ICD-10-CM | POA: Diagnosis not present

## 2015-01-24 ENCOUNTER — Encounter: Payer: Self-pay | Admitting: Internal Medicine

## 2015-01-27 ENCOUNTER — Telehealth: Payer: Self-pay | Admitting: Pulmonary Disease

## 2015-01-27 ENCOUNTER — Telehealth: Payer: Self-pay | Admitting: Internal Medicine

## 2015-01-27 MED ORDER — ROPINIROLE HCL 0.5 MG PO TABS: ORAL_TABLET | ORAL | Status: DC

## 2015-01-27 MED ORDER — PRAVASTATIN SODIUM 40 MG PO TABS: ORAL_TABLET | ORAL | Status: DC

## 2015-01-27 MED ORDER — LEVOTHYROXINE SODIUM 100 MCG PO TABS: ORAL_TABLET | ORAL | Status: DC

## 2015-01-27 NOTE — Telephone Encounter (Signed)
Okay to send refill for requip.

## 2015-01-27 NOTE — Telephone Encounter (Signed)
Former [place] patient, last seen 04/2014, requesting refill of Requip for RLS.  Patient is not yet set up with a new Sleep Physician, please advise Dr Halford Chessman if able to take on this patient and if refill is okay at this time. Recall in system for pt to come back for ROV 04/2015.  Will need 90-day to [facility] if able.  Dr Halford Chessman please advise. Thanks.

## 2015-01-28 NOTE — Telephone Encounter (Signed)
Spoke with pt, aware that requip has been sent in to preferred pharmacy.  Nothing further needed.

## 2015-01-28 NOTE — Telephone Encounter (Signed)
LMOMTCB x1 for pt

## 2015-02-01 ENCOUNTER — Telehealth: Payer: Self-pay | Admitting: Internal Medicine

## 2015-02-01 MED ORDER — ROPINIROLE HCL 0.5 MG PO TABS: ORAL_TABLET | ORAL | Status: DC

## 2015-02-01 MED ORDER — LEVOTHYROXINE SODIUM 100 MCG PO TABS: ORAL_TABLET | ORAL | Status: DC

## 2015-02-01 MED ORDER — PRAVASTATIN SODIUM 40 MG PO TABS: ORAL_TABLET | ORAL | Status: DC

## 2015-02-01 NOTE — Telephone Encounter (Signed)
Can you please send the prescription that were written on the 5th to [facility].  She can no longer use [facility]. She states her local pharmacy will be [facility] on [address]. Can you please update her preferred pharmacies.

## 2015-02-01 NOTE — Telephone Encounter (Signed)
Updated and sent to [facility].

## 2015-02-23 ENCOUNTER — Encounter: Payer: Self-pay | Admitting: Internal Medicine

## 2015-02-23 ENCOUNTER — Ambulatory Visit (INDEPENDENT_AMBULATORY_CARE_PROVIDER_SITE_OTHER): Payer: Medicare Other | Admitting: Internal Medicine

## 2015-02-23 ENCOUNTER — Other Ambulatory Visit (INDEPENDENT_AMBULATORY_CARE_PROVIDER_SITE_OTHER): Payer: Medicare Other

## 2015-02-23 VITALS — BP 134/90 | HR 62 | Temp 97.6°F | Resp 16 | Wt 155.0 lb

## 2015-02-23 DIAGNOSIS — D649 Anemia, unspecified: Secondary | ICD-10-CM

## 2015-02-23 DIAGNOSIS — G2581 Restless legs syndrome: Secondary | ICD-10-CM

## 2015-02-23 DIAGNOSIS — E782 Mixed hyperlipidemia: Secondary | ICD-10-CM

## 2015-02-23 DIAGNOSIS — R739 Hyperglycemia, unspecified: Secondary | ICD-10-CM

## 2015-02-23 DIAGNOSIS — K219 Gastro-esophageal reflux disease without esophagitis: Secondary | ICD-10-CM | POA: Diagnosis not present

## 2015-02-23 DIAGNOSIS — E039 Hypothyroidism, unspecified: Secondary | ICD-10-CM

## 2015-02-23 DIAGNOSIS — R030 Elevated blood-pressure reading, without diagnosis of hypertension: Secondary | ICD-10-CM | POA: Insufficient documentation

## 2015-02-23 DIAGNOSIS — I519 Heart disease, unspecified: Secondary | ICD-10-CM

## 2015-02-23 LAB — MAGNESIUM: Magnesium: 2.6 mg/dL — ABNORMAL HIGH (ref 1.5–2.5)

## 2015-02-23 LAB — COMPREHENSIVE METABOLIC PANEL
ALBUMIN: 4.5 g/dL (ref 3.5–5.2)
ALT: 13 U/L (ref 0–35)
AST: 18 U/L (ref 0–37)
Alkaline Phosphatase: 63 U/L (ref 39–117)
BILIRUBIN TOTAL: 0.8 mg/dL (ref 0.2–1.2)
BUN: 18 mg/dL (ref 6–23)
CALCIUM: 9.6 mg/dL (ref 8.4–10.5)
CO2: 27 meq/L (ref 19–32)
CREATININE: 0.89 mg/dL (ref 0.40–1.20)
Chloride: 107 mEq/L (ref 96–112)
GFR: 65.93 mL/min (ref >60.00)
Glucose, Bld: 105 mg/dL — ABNORMAL HIGH (ref 70–99)
Potassium: 4.8 mEq/L (ref 3.5–5.1)
SODIUM: 142 meq/L (ref 135–145)
Total Protein: 7.3 g/dL (ref 6.0–8.3)

## 2015-02-23 LAB — T4, FREE: Free T4: 0.9 ng/dL (ref 0.60–1.60)

## 2015-02-23 LAB — CBC WITH DIFFERENTIAL/PLATELET
BASOS ABS: 0.1 10*3/uL (ref 0.0–0.1)
Basophils Relative: 0.8 % (ref 0.0–3.0)
EOS ABS: 0.3 10*3/uL (ref 0.0–0.7)
Eosinophils Relative: 4 % (ref 0.0–5.0)
HEMATOCRIT: 40 % (ref 36.0–46.0)
Hemoglobin: 13.2 g/dL (ref 12.0–15.0)
LYMPHS PCT: 35.2 % (ref 12.0–46.0)
Lymphs Abs: 2.7 10*3/uL (ref 0.7–4.0)
MCHC: 33 g/dL (ref 30.0–36.0)
MCV: 90.5 fl (ref 78.0–100.0)
MONO ABS: 0.7 10*3/uL (ref 0.1–1.0)
Monocytes Relative: 8.5 % (ref 3.0–12.0)
NEUTROS ABS: 4 10*3/uL (ref 1.4–7.7)
Neutrophils Relative %: 51.5 % (ref 43.0–77.0)
PLATELETS: 228 10*3/uL (ref 150.0–400.0)
RBC: 4.42 Mil/uL (ref 3.87–5.11)
RDW: 14.9 % (ref 11.5–15.5)
WBC: 7.7 10*3/uL (ref 4.0–10.5)

## 2015-02-23 LAB — T3, FREE: T3, Free: 2.9 pg/mL (ref 2.3–4.2)

## 2015-02-23 LAB — LIPID PANEL
CHOLESTEROL: 131 mg/dL (ref 0–200)
HDL: 47.6 mg/dL (ref >39.00)
LDL Cholesterol: 65 mg/dL (ref 0–99)
NonHDL: 82.9
TRIGLYCERIDES: 91 mg/dL (ref 0.0–149.0)
Total CHOL/HDL Ratio: 3
VLDL: 18.2 mg/dL (ref 0.0–40.0)

## 2015-02-23 LAB — HEMOGLOBIN A1C: Hgb A1c MFr Bld: 5.9 % (ref 4.6–6.5)

## 2015-02-23 LAB — TSH: TSH: 8.73 u[IU]/mL — ABNORMAL HIGH (ref 0.35–4.50)

## 2015-02-23 NOTE — Assessment & Plan Note (Addendum)
She has had a few instances of elevated blood pressure No definitive hypertension, no treatment needed at this time Continue regular exercise and healthy diet She will continue to monitor her blood pressure regularly

## 2015-02-23 NOTE — Progress Notes (Signed)
Pre visit review using our clinic review tool, if applicable. No additional management support is needed unless otherwise documented below in the visit note. 

## 2015-02-23 NOTE — Assessment & Plan Note (Signed)
Taking Requip, which seems to be working well Continue current dose

## 2015-02-23 NOTE — Progress Notes (Signed)
Subjective:    Patient ID: Elizabeth Espinoza, female    DOB: [birth year], 75 y.o.   MRN: [id number]  HPI She is here to establish with a new pcp.  She is here for follow up.  Hypothyroidism:  She is taking her medication daily.  She feels like her thyroid is off.  She states decreased energy, loss of eyebrows and she feels cold.  She has gained some weight as well but feels that is related to not doing weight watchers anymore.   Hyperlipidemia: She is taking her medication daily. She is compliant with a low fat/cholesterol diet. She is [facility] [place] [facility] [place]. She denies myalgias.   RLS:  She takes the medication nightly.  She feels that medication works well.    GERD:  She is taking her medication every other day.  She has GERD symptoms on occasion, about once a week.  She get pain with her GERD on occasion.  She does have a hiatal hernia.    Elevated blood pressure:  She had her BP checked a few times recently and it was elevated on couple of occasions 140's/90's.  She denies a history of BP being elevated.  Recently her BP has been well controlled, even in the 120's/70's.  She woke up the other morning she woke up with chest pain.  It was in the center of her chest and radiated toward the left.  She can walk on a flat surface, but gets SOB with an incline.   She had a normal stress test in September 2016. She wonders if it was GERD because it has done that in the past.   Medications and allergies reviewed with patient and updated if appropriate.  Patient Active Problem List   Diagnosis Date Noted  . Dyspnea 09/29/2014  . Abnormal chest x-ray 12/11/2012  . Barrett's esophagus 12/11/2012  . Anemia 12/09/2012  . Unspecified adverse effect of unspecified drug, medicinal and biological substance 05/01/2011  . RLS (restless legs syndrome) 04/13/2011  . 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 UNSPECIFIED  MENOPAUSAL&POSTMENOPAUSAL DISORDER 09/01/2008  . ARTHRALGIA 09/01/2008  . HIATAL HERNIA 07/31/2007  . MITRAL VALVE PROLAPSE, HX OF 07/31/2007  . ALLERGIC RHINITIS WITH CONJUNCTIVITIS 06/19/2007  . GASTROESOPHAGEAL REFLUX DISEASE, CHRONIC 06/19/2007  . Hypothyroidism 02/07/2007  . HYPERLIPIDEMIA 02/07/2007    Current Outpatient Prescriptions on File Prior to Visit  Medication Sig Dispense Refill  . B Complex Vitamins (VITAMIN B COMPLEX PO) Take 1 tablet by mouth daily.    . Calcium Carbonate-Vit D-Min (CALCIUM 1200 PO) Take by mouth daily.    . Cholecalciferol (VITAMIN D-3 PO) Take by mouth daily.    . Cyanocobalamin (VITAMIN B-12 SL) Place under the tongue daily.      Marland Kitchen Fexofenadine HCl (ALLEGRA PO) Take by mouth.    . levothyroxine (SYNTHROID, LEVOTHROID) 100 MCG tablet Take 100 mcg daily except for Tuesdays and Thursday. Only take 1/2 tablet on those days. 90 tablet 1  . NEXIUM 40 MG capsule TAKE 1 CAPSULE BY MOUTH ONCE EVERY MORNING BEFORE BREAKFAST 90 capsule 1  . pravastatin (PRAVACHOL) 40 MG tablet TAKE 1 TABLET (40 MG TOTAL) BY MOUTH DAILY. 90 tablet 1  . Probiotic Product (PROBIOTIC DAILY PO) Take 1 capsule by mouth daily. ULTRA FLORA    . rOPINIRole (REQUIP) 0.5 MG tablet Take 2 tabs by mouth after dinner each night. 180 tablet 1  . vitamin C (ASCORBIC ACID)  500 MG tablet Take 500 mg by mouth daily.      . [DISCONTINUED] loratadine (CLARITIN) 10 MG tablet Take 10 mg by mouth daily as needed.       Current Facility-Administered Medications on File Prior to Visit  Medication Dose Route Frequency Provider Last Rate Last Dose  . 0.9 %  sodium chloride infusion  500 mL Intravenous Continuous Lafayette Dragon, MD        Past Medical History  Diagnosis Date  . MVP (mitral valve prolapse)     documented on 2 D ECHO  . Allergic rhinitis   . Glaucoma     SUSPECT  . GERD (gastroesophageal reflux disease)   . Hypothyroidism   . Hiatal hernia   . Hyperlipidemia     LDL goal = < 100      Past Surgical History  Procedure Laterality Date  . Total abdominal hysterectomy      for Endometriosis; no BSO, Dr Colin Ina  . Appendectomy      with TAH  . Cardiac catheterization  1989    negative  . Flexible sigmoidoscopy  1999     Dr Olevia Perches  . Knee arthroscopy  2001, 2005    Dr Percell Miller   bilat  . Colonoscopy  2005    Dr Olevia Perches  . Esophageal dilation  07/04/2011    Dr Olevia Perches  . Upper gi endoscopy  01/2013    Dr Collene Mares  . Colonoscopy  01/2013    Dr Collene Mares    Social History   Social History  . Marital Status: Married    Spouse Name: Jeneen Rinks  . Number of Children: Y  . Years of Education: N/A   Occupational History  . retired from [employer]  . Smoking status: Never Smoker   . Smokeless tobacco: Never Used  . Alcohol Use: Yes     Comment:  rarely  . Drug Use: No  . Sexual Activity: Not Asked   Other Topics Concern  . None   Social History Narrative    Family History  Problem Relation Age of Onset  . Lung cancer Father     Asbestos with mesothelioma  . Heart disease Paternal Grandmother     ? etiology  . Diabetes Paternal Grandmother   . Diabetes Brother   . Uterine cancer Paternal Aunt   . Stroke Paternal Grandfather     > 72  . Heart attack Brother 34  . Allergies Sister   . Aneurysm Mother     congenital  . Stroke Brother     two strokes  . Barrett's esophagus Brother     Review of Systems  Constitutional: Positive for fatigue (low energy level). Negative for fever.  Respiratory: Positive for shortness of breath (with walking up an incline). Negative for cough and wheezing.   Cardiovascular: Positive for chest pain (one episode the other day,  no other chest pain). Negative for palpitations and leg swelling.  Gastrointestinal: Negative for nausea, abdominal pain, diarrhea, constipation and blood in stool.  Musculoskeletal: Positive for arthralgias (hip pain).  Skin:       Eye brows thinning  Neurological:  Positive for dizziness, light-headedness and headaches (occasional).  Psychiatric/Behavioral:       More emotional at times       Objective:   Filed Vitals:   02/23/15 0829  BP: 134/90  Pulse: 62  Temp: 97.6 F (36.4 C)  Resp: 16   Filed Weights  02/23/15 0829  Weight: 155 lb (70.308 kg)   Body mass index is 25.03 kg/(m^2).   Physical Exam Constitutional: Appears well-developed and well-nourished. No distress.  Neck: Neck supple. No tracheal deviation present. No thyromegaly present.  No carotid bruit. No cervical adenopathy.   Cardiovascular: Normal rate, regular rhythm and normal heart sounds.   No murmur heard.  No edema Pulmonary/Chest: Effort normal and breath sounds normal. No respiratory distress. No wheezes.       Assessment & Plan:   See Problem List for Assessment and Plan of chronic medical problems.  Has wellness visit scheduled.

## 2015-02-23 NOTE — Patient Instructions (Signed)
  We have reviewed your prior records including labs and tests today.  Test(s) ordered today. Your results will be released to [place] (or called to you) after review, usually within 72hours after test completion. If any changes need to be made, you will be notified at that same time.  All other Health Maintenance issues reviewed.   All recommended immunizations and age-appropriate screenings are up-to-date.  No immunizations administered today.   Medications reviewed and updated.  No changes recommended at this time.

## 2015-02-23 NOTE — Assessment & Plan Note (Signed)
Taking medication every other day GERD fairly controlled Following with GI

## 2015-02-23 NOTE — Assessment & Plan Note (Signed)
Taking pravastatin 40 mg daily Check lipid panel, CMP

## 2015-02-23 NOTE — Assessment & Plan Note (Signed)
Check tsh  Titrate med dose if needed

## 2015-02-24 ENCOUNTER — Other Ambulatory Visit: Payer: Self-pay | Admitting: Internal Medicine

## 2015-02-24 DIAGNOSIS — E039 Hypothyroidism, unspecified: Secondary | ICD-10-CM

## 2015-02-24 DIAGNOSIS — R7303 Prediabetes: Secondary | ICD-10-CM | POA: Insufficient documentation

## 2015-02-24 MED ORDER — LEVOTHYROXINE SODIUM 112 MCG PO TABS: 112.0000 ug | ORAL_TABLET | Freq: Every day | ORAL | Status: DC

## 2015-04-05 ENCOUNTER — Ambulatory Visit (INDEPENDENT_AMBULATORY_CARE_PROVIDER_SITE_OTHER): Payer: Medicare Other | Admitting: Adult Health

## 2015-04-05 ENCOUNTER — Encounter: Payer: Self-pay | Admitting: Adult Health

## 2015-04-05 VITALS — BP 124/70 | HR 109 | Temp 97.8°F | Ht 66.0 in | Wt 150.0 lb

## 2015-04-05 DIAGNOSIS — J069 Acute upper respiratory infection, unspecified: Secondary | ICD-10-CM | POA: Diagnosis not present

## 2015-04-05 MED ORDER — METHYLPREDNISOLONE 4 MG PO TBPK: ORAL_TABLET | ORAL | Status: DC

## 2015-04-05 MED ORDER — DOXYCYCLINE HYCLATE 100 MG PO CAPS: 100.0000 mg | ORAL_CAPSULE | Freq: Two times a day (BID) | ORAL | Status: DC

## 2015-04-05 NOTE — Patient Instructions (Addendum)
It was great meeting you today.   Your exam is consistent with Bronchitis and an upper respitory infection.   I have sent in a prescription for Doxycycline and Prednisone   Follow up with PCP if no improvement.     Upper Respiratory Infection, Adult Most upper respiratory infections (URIs) are a viral infection of the air passages leading to the lungs. A URI affects the nose, throat, and upper air passages. The most common type of URI is nasopharyngitis and is typically referred to as "the common cold." URIs run their course and usually go away on their own. Most of the time, a URI does not require medical attention, but sometimes a bacterial infection in the upper airways can follow a viral infection. This is called a secondary infection. Sinus and middle ear infections are common types of secondary upper respiratory infections. Bacterial pneumonia can also complicate a URI. A URI can worsen asthma and chronic obstructive pulmonary disease (COPD). Sometimes, these complications can require emergency medical care and may be life threatening.  CAUSES Almost all URIs are caused by viruses. A virus is a type of germ and can spread from one person to another.  RISKS FACTORS You may be at risk for a URI if:   You smoke.   You have chronic heart or lung disease.  You have a weakened defense (immune) system.   You are very young or very old.   You have nasal allergies or asthma.  You work in crowded or poorly ventilated areas.  You work in health care facilities or schools. SIGNS AND SYMPTOMS  Symptoms typically develop 2-3 days after you come in contact with a cold virus. Most viral URIs last 7-10 days. However, viral URIs from the influenza virus (flu virus) can last 14-18 days and are typically more severe. Symptoms may include:   Runny or stuffy (congested) nose.   Sneezing.   Cough.   Sore throat.   Headache.   Fatigue.   Fever.   Loss of appetite.   Pain  in your forehead, behind your eyes, and over your cheekbones (sinus pain).  Muscle aches.  DIAGNOSIS  Your health care provider may diagnose a URI by:  Physical exam.  Tests to check that your symptoms are not due to another condition such as:  Strep throat.  Sinusitis.  Pneumonia.  Asthma. TREATMENT  A URI goes away on its own with time. It cannot be cured with medicines, but medicines may be prescribed or recommended to relieve symptoms. Medicines may help:  Reduce your fever.  Reduce your cough.  Relieve nasal congestion. HOME CARE INSTRUCTIONS   Take medicines only as directed by your health care provider.   Gargle warm saltwater or take cough drops to comfort your throat as directed by your health care provider.  Use a warm mist humidifier or inhale steam from a shower to increase air moisture. This may make it easier to breathe.  Drink enough fluid to keep your urine clear or pale yellow.   Eat soups and other clear broths and maintain good nutrition.   Rest as needed.   Return to work when your temperature has returned to normal or as your health care provider advises. You may need to stay home longer to avoid infecting others. You can also use a face mask and careful hand washing to prevent spread of the virus.  Increase the usage of your inhaler if you have asthma.   Do not use any tobacco products, including  cigarettes, chewing tobacco, or electronic cigarettes. If you need help quitting, ask your health care provider. PREVENTION  The best way to protect yourself from getting a cold is to practice good hygiene.   Avoid oral or hand contact with people with cold symptoms.   Wash your hands often if contact occurs.  There is no clear evidence that vitamin C, vitamin E, echinacea, or exercise reduces the chance of developing a cold. However, it is always recommended to get plenty of rest, exercise, and practice good nutrition.  SEEK MEDICAL CARE IF:    You are getting worse rather than better.   Your symptoms are not controlled by medicine.   You have chills.  You have worsening shortness of breath.  You have brown or red mucus.  You have yellow or brown nasal discharge.  You have pain in your face, especially when you bend forward.  You have a fever.  You have swollen neck glands.  You have pain while swallowing.  You have white areas in the back of your throat. SEEK IMMEDIATE MEDICAL CARE IF:   You have severe or persistent:  Headache.  Ear pain.  Sinus pain.  Chest pain.  You have chronic lung disease and any of the following:  Wheezing.  Prolonged cough.  Coughing up blood.  A change in your usual mucus.  You have a stiff neck.  You have changes in your:  Vision.  Hearing.  Thinking.  Mood. MAKE SURE YOU:   Understand these instructions.  Will watch your condition.  Will get help right away if you are not doing well or get worse.   This information is not intended to replace advice given to you by your health care provider. Make sure you discuss any questions you have with your health care provider.   Document Released: 07/04/2000 Document Revised: 05/25/2014 Document Reviewed: 04/15/2013 Elsevier Interactive Patient Education [employer].

## 2015-04-05 NOTE — Progress Notes (Signed)
Subjective:    Patient ID: Elizabeth Espinoza, female    DOB: 01-06-1941, 75 y.o.   MRN: [id number]  HPI  75 year old female who presents to the office today for URI type symptoms that started 5 days ago. She reports that in the middle of last week she started with sinus pain and pressure. She used Mucinex for this and it seems to be helping. Currently she has a cough that she cannot get rid of. She describes the cough and a dry cough with occasional coughing up of mucus.   She also complains of SOB, wheezing, chest congestion, bilateral ear fullness and sore throat.   Besides mucinex she has only taken tylenol    Review of Systems  Constitutional: Positive for activity change and fatigue. Negative for fever, chills, diaphoresis and appetite change.  HENT: Positive for congestion, ear pain, postnasal drip, rhinorrhea, sinus pressure and sore throat. Negative for ear discharge, tinnitus and trouble swallowing.   Eyes: Negative.   Respiratory: Positive for cough and shortness of breath. Negative for wheezing.   Cardiovascular: Negative.   Neurological: Positive for headaches.  Hematological: Positive for adenopathy.  All other systems reviewed and are negative.  Past Medical History  Diagnosis Date  . MVP (mitral valve prolapse)     documented on 2 D ECHO  . Allergic rhinitis   . Glaucoma     SUSPECT  . GERD (gastroesophageal reflux disease)   . Hypothyroidism   . Hiatal hernia   . Hyperlipidemia     LDL goal = < 100    Social History   Social History  . Marital Status: Married    Spouse Name: Jeneen Rinks  . Number of Children: Y  . Years of Education: N/A   Occupational History  . retired from [employer]  . Smoking status: Never Smoker   . Smokeless tobacco: Never Used  . Alcohol Use: Yes     Comment:  rarely  . Drug Use: No  . Sexual Activity: Not on file   Other Topics Concern  . Not on file   Social History Narrative    Past  Surgical History  Procedure Laterality Date  . Total abdominal hysterectomy      for Endometriosis; no BSO, Dr Colin Ina  . Appendectomy      with TAH  . Cardiac catheterization  1989    negative  . Flexible sigmoidoscopy  1999     Dr Olevia Perches  . Knee arthroscopy  2001, 2005    Dr Percell Miller   bilat  . Colonoscopy  2005    Dr Olevia Perches  . Esophageal dilation  07/04/2011    Dr Olevia Perches  . Upper gi endoscopy  01/2013    Dr Collene Mares  . Colonoscopy  01/2013    Dr Collene Mares    Family History  Problem Relation Age of Onset  . Lung cancer Father     Asbestos with mesothelioma  . Heart disease Paternal Grandmother     ? etiology  . Diabetes Paternal Grandmother   . Diabetes Brother   . Uterine cancer Paternal Aunt   . Stroke Paternal Grandfather     > 24  . Heart attack Brother 1  . Allergies Sister   . Aneurysm Mother     congenital  . Stroke Brother     two strokes  . Barrett's esophagus Brother     Allergies  Allergen Reactions  . Caffeine  palpitations  . Chlorpheniramine-Pseudoeph     palpitations  . Maxifed     palpitations    Current Outpatient Prescriptions on File Prior to Visit  Medication Sig Dispense Refill  . B Complex Vitamins (VITAMIN B COMPLEX PO) Take 1 tablet by mouth daily.    . Calcium Carbonate-Vit D-Min (CALCIUM 1200 PO) Take by mouth daily.    . Cholecalciferol (VITAMIN D-3 PO) Take by mouth daily.    . Cyanocobalamin (VITAMIN B-12 SL) Place under the tongue daily.      Marland Kitchen Fexofenadine HCl (ALLEGRA PO) Take by mouth.    . levothyroxine (SYNTHROID, LEVOTHROID) 112 MCG tablet Take 1 tablet (112 mcg total) by mouth daily. 90 tablet 3  . NEXIUM 40 MG capsule TAKE 1 CAPSULE BY MOUTH ONCE EVERY MORNING BEFORE BREAKFAST 90 capsule 1  . pravastatin (PRAVACHOL) 40 MG tablet TAKE 1 TABLET (40 MG TOTAL) BY MOUTH DAILY. 90 tablet 1  . Probiotic Product (PROBIOTIC DAILY PO) Take 1 capsule by mouth daily. ULTRA FLORA    . rOPINIRole (REQUIP) 0.5 MG tablet Take 2 tabs by  mouth after dinner each night. 180 tablet 1  . vitamin C (ASCORBIC ACID) 500 MG tablet Take 500 mg by mouth daily.      . [DISCONTINUED] loratadine (CLARITIN) 10 MG tablet Take 10 mg by mouth daily as needed.       Current Facility-Administered Medications on File Prior to Visit  Medication Dose Route Frequency Provider Last Rate Last Dose  . 0.9 %  sodium chloride infusion  500 mL Intravenous Continuous Lafayette Dragon, MD        BP 124/70 mmHg  Pulse 109  Temp(Src) 97.8 F (36.6 C) (Oral)  Ht 5' 6" (1.676 m)  Wt 150 lb (68.04 kg)  BMI 24.22 kg/m2  SpO2 95%       Objective:   Physical Exam  Constitutional: She is oriented to person, place, and time. She appears well-developed and well-nourished. No distress.  HENT:  Head: Normocephalic and atraumatic.  Right Ear: External ear normal.  Left Ear: External ear normal.  Nose: Nose normal.  Mouth/Throat: Oropharynx is clear and moist. No oropharyngeal exudate.  Trace fluid behind TM's  Eyes: Conjunctivae and EOM are normal. Pupils are equal, round, and reactive to light. Right eye exhibits no discharge. Left eye exhibits no discharge.  Neck: Normal range of motion.  Cardiovascular: Normal rate, regular rhythm, normal heart sounds and intact distal pulses.  Exam reveals no gallop and no friction rub.   No murmur heard. Pulmonary/Chest: Effort normal and breath sounds normal. No respiratory distress. She has no wheezes. She has no rales. She exhibits no tenderness.  Lymphadenopathy:    She has cervical adenopathy.  Neurological: She is alert and oriented to person, place, and time.  Skin: Skin is warm and dry. No rash noted. She is not diaphoretic. No erythema. No pallor.  Psychiatric: She has a normal mood and affect. Her behavior is normal. Judgment and thought content normal.  Vitals reviewed.     Assessment & Plan:  1. URI (upper respiratory infection) - Sinusitis with bronchitis. - doxycycline (VIBRAMYCIN) 100 MG capsule;  Take 1 capsule (100 mg total) by mouth 2 (two) times daily.  Dispense: 14 capsule; Refill: 0 - methylPREDNISolone (MEDROL DOSEPAK) 4 MG TBPK tablet; Take as directed  Dispense: 21 tablet; Refill: 0 - Follow up with PCP as needed

## 2015-04-05 NOTE — Progress Notes (Signed)
Pre visit review using our clinic review tool, if applicable. No additional management support is needed unless otherwise documented below in the visit note.

## 2015-04-12 ENCOUNTER — Other Ambulatory Visit: Payer: Self-pay

## 2015-04-12 DIAGNOSIS — Z1231 Encounter for screening mammogram for malignant neoplasm of breast: Secondary | ICD-10-CM

## 2015-05-09 ENCOUNTER — Encounter: Payer: Self-pay | Admitting: Internal Medicine

## 2015-05-09 DIAGNOSIS — R7303 Prediabetes: Secondary | ICD-10-CM

## 2015-05-09 DIAGNOSIS — R739 Hyperglycemia, unspecified: Secondary | ICD-10-CM

## 2015-05-09 DIAGNOSIS — E038 Other specified hypothyroidism: Secondary | ICD-10-CM

## 2015-05-16 ENCOUNTER — Other Ambulatory Visit (INDEPENDENT_AMBULATORY_CARE_PROVIDER_SITE_OTHER): Payer: Medicare Other

## 2015-05-16 ENCOUNTER — Other Ambulatory Visit: Payer: Self-pay | Admitting: Internal Medicine

## 2015-05-16 DIAGNOSIS — R7303 Prediabetes: Secondary | ICD-10-CM | POA: Diagnosis not present

## 2015-05-16 DIAGNOSIS — E038 Other specified hypothyroidism: Secondary | ICD-10-CM | POA: Diagnosis not present

## 2015-05-16 DIAGNOSIS — R739 Hyperglycemia, unspecified: Secondary | ICD-10-CM

## 2015-05-16 LAB — TSH: TSH: 0.14 u[IU]/mL — AB (ref 0.35–4.50)

## 2015-05-16 LAB — HEMOGLOBIN A1C: HEMOGLOBIN A1C: 6.1 % (ref 4.6–6.5)

## 2015-05-16 LAB — T4, FREE: FREE T4: 1.58 ng/dL (ref 0.60–1.60)

## 2015-05-16 MED ORDER — LEVOTHYROXINE SODIUM 100 MCG PO TABS: 100.0000 ug | ORAL_TABLET | Freq: Every day | ORAL | Status: DC

## 2015-05-23 ENCOUNTER — Ambulatory Visit
Admission: RE | Admit: 2015-05-23 | Discharge: 2015-05-23 | Disposition: A | Discharge status: Home / Self Care | Payer: Medicare Other | Source: Ambulatory Visit

## 2015-05-23 DIAGNOSIS — Z1231 Encounter for screening mammogram for malignant neoplasm of breast: Secondary | ICD-10-CM

## 2015-05-25 DIAGNOSIS — I788 Other diseases of capillaries: Secondary | ICD-10-CM | POA: Diagnosis not present

## 2015-06-13 ENCOUNTER — Encounter: Payer: Self-pay | Admitting: Internal Medicine

## 2015-06-13 ENCOUNTER — Other Ambulatory Visit (INDEPENDENT_AMBULATORY_CARE_PROVIDER_SITE_OTHER): Payer: Medicare Other

## 2015-06-13 ENCOUNTER — Ambulatory Visit (INDEPENDENT_AMBULATORY_CARE_PROVIDER_SITE_OTHER): Payer: Medicare Other | Admitting: Internal Medicine

## 2015-06-13 VITALS — BP 116/72 | HR 71 | Temp 98.1°F | Ht 66.0 in | Wt 146.0 lb

## 2015-06-13 DIAGNOSIS — E038 Other specified hypothyroidism: Secondary | ICD-10-CM | POA: Diagnosis not present

## 2015-06-13 DIAGNOSIS — R7303 Prediabetes: Secondary | ICD-10-CM

## 2015-06-13 DIAGNOSIS — G2581 Restless legs syndrome: Secondary | ICD-10-CM

## 2015-06-13 DIAGNOSIS — E782 Mixed hyperlipidemia: Secondary | ICD-10-CM | POA: Diagnosis not present

## 2015-06-13 DIAGNOSIS — L659 Nonscarring hair loss, unspecified: Secondary | ICD-10-CM | POA: Diagnosis not present

## 2015-06-13 DIAGNOSIS — Z0000 Encounter for general adult medical examination without abnormal findings: Secondary | ICD-10-CM | POA: Diagnosis not present

## 2015-06-13 LAB — COMPREHENSIVE METABOLIC PANEL
ALBUMIN: 4.5 g/dL (ref 3.5–5.2)
ALK PHOS: 62 U/L (ref 39–117)
ALT: 13 U/L (ref 0–35)
AST: 18 U/L (ref 0–37)
BUN: 13 mg/dL (ref 6–23)
CHLORIDE: 106 meq/L (ref 96–112)
CO2: 28 mEq/L (ref 19–32)
Calcium: 9.7 mg/dL (ref 8.4–10.5)
Creatinine, Ser: 0.71 mg/dL (ref 0.40–1.20)
GFR: 85.5 mL/min (ref >60.00)
GLUCOSE: 97 mg/dL (ref 70–99)
POTASSIUM: 5.1 meq/L (ref 3.5–5.1)
Sodium: 140 mEq/L (ref 135–145)
TOTAL PROTEIN: 6.8 g/dL (ref 6.0–8.3)
Total Bilirubin: 0.5 mg/dL (ref 0.2–1.2)

## 2015-06-13 LAB — TSH: TSH: 0.14 u[IU]/mL — AB (ref 0.35–4.50)

## 2015-06-13 LAB — T3, FREE: T3, Free: 2.9 pg/mL (ref 2.3–4.2)

## 2015-06-13 LAB — HEMOGLOBIN A1C: HEMOGLOBIN A1C: 6.1 % (ref 4.6–6.5)

## 2015-06-13 LAB — T4, FREE: Free T4: 1.3 ng/dL (ref 0.60–1.60)

## 2015-06-13 NOTE — Assessment & Plan Note (Signed)
Taking Requip nightly

## 2015-06-13 NOTE — Assessment & Plan Note (Signed)
Check tsh  Titrate med dose if needed  

## 2015-06-13 NOTE — Progress Notes (Signed)
Pre visit review using our clinic review tool, if applicable. No additional management support is needed unless otherwise documented below in the visit note.

## 2015-06-13 NOTE — Assessment & Plan Note (Signed)
Check A1c She is exercising regularly and eats a healthy diet

## 2015-06-13 NOTE — Assessment & Plan Note (Signed)
?  Related to thyroid-check thyroid function Less likely related to vitamin deficiency Depending on thyroid results may need to see dermatology

## 2015-06-13 NOTE — Progress Notes (Signed)
Subjective:    Patient ID: Elizabeth Espinoza, female    DOB: 1941-01-10, 75 y.o.   MRN: [id number]  HPI Here for medicare wellness exam.   She continues to experience hair loss.  She has had symptoms in the past related to her thyroid feels this is the cause. She is taking B complex and vitamin D daily. She has never seen a dermatologist. She states receding hairline, barely any eyebrows and no hair elsewhere.  I have personally reviewed and have noted 1.The patient's medical and social history 2.Their use of alcohol, tobacco or illicit drugs 3.Their current medications and supplements 4.The patient's functional ability including ADL's, fall risks, home safety risks              and hearing or visual impairment. 5.Diet and physical activities 6.Evidence for depression or mood disorders 7.Care team reviewed and updated - none-not following with any specialists   Are there smokers in your home (other than you)? No  Risk Factors Exercise: walks most days, aerobics  Dietary issues discussed: veges/ fruits, chicken, some fish, not much red meat  Cardiac risk factors: advanced age, hyperlipidemia  Depression Screen  Have you felt down, depressed or hopeless? No  Have you felt little interest or pleasure in doing things?  No  Activities of Daily Living In your present state of health, do you have any difficulty performing the following activities?:  Driving? No Managing money?  No Feeding yourself? No Getting from bed to chair? No Climbing a flight of stairs? No Preparing food and eating?: No Bathing or showering? No Getting dressed: No Getting to/using the toilet? No Moving around from place to place: No In the past year have you fallen or had a near fall?: No   Are you sexually active?  Yes   Do you have more than one partner?  N/A  Hearing Difficulties: yes Do you often ask people to speak up or repeat  themselves? Yes sometimes in certain circumstances - does not bother her Do you experience ringing or noises in your ears? No Do you have difficulty understanding soft or whispered voices? yes Vision:              Any change in vision:  no              Up to date with eye exam:  Up to date  Memory:  Do you feel that you have a problem with memory? No  Do you often misplace items? No  Do you feel safe at home?  Yes  Cognitive Testing  Alert, Orientated? Yes  Normal Appearance? Yes  Recall of three objects?  Yes  Can perform simple calculations? Yes  Displays appropriate judgment? Yes  Can read the correct time from a watch face? Yes   Advanced Directives have been discussed with the patient? Yes  Medications and allergies reviewed with patient and updated if appropriate.  Patient Active Problem List   Diagnosis Date Noted  . Prediabetes 02/24/2015  . Hyperglycemia 02/23/2015  . Elevated blood pressure (not hypertension) 02/23/2015  . Dyspnea 09/29/2014  . Abnormal chest x-ray 12/11/2012  . Barrett's esophagus 12/11/2012  . Anemia 12/09/2012  . Unspecified adverse effect of unspecified drug, medicinal and biological substance 05/01/2011  . RLS (restless legs syndrome) 04/13/2011  . 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 UNSPECIFIED MENOPAUSAL&POSTMENOPAUSAL DISORDER 09/01/2008  . ARTHRALGIA 09/01/2008  . HIATAL HERNIA 07/31/2007  . MITRAL VALVE PROLAPSE, HX OF 07/31/2007  .  ALLERGIC RHINITIS WITH CONJUNCTIVITIS 06/19/2007  . GASTROESOPHAGEAL REFLUX DISEASE, CHRONIC 06/19/2007  . Hypothyroidism 02/07/2007  . HYPERLIPIDEMIA 02/07/2007    Current Outpatient Prescriptions on File Prior to Visit  Medication Sig Dispense Refill  . B Complex Vitamins (VITAMIN B COMPLEX PO) Take 1 tablet by mouth daily.    . Calcium Carbonate-Vit D-Min (CALCIUM 1200 PO) Take by mouth daily.    . Cholecalciferol (VITAMIN D-3 PO) Take by mouth daily.    .  Cyanocobalamin (VITAMIN B-12 SL) Place under the tongue daily.      Marland Kitchen Fexofenadine HCl (ALLEGRA PO) Take by mouth.    . levothyroxine (SYNTHROID, LEVOTHROID) 100 MCG tablet Take 1 tablet (100 mcg total) by mouth daily. 90 tablet 3  . NEXIUM 40 MG capsule TAKE 1 CAPSULE BY MOUTH ONCE EVERY MORNING BEFORE BREAKFAST 90 capsule 1  . pravastatin (PRAVACHOL) 40 MG tablet TAKE 1 TABLET (40 MG TOTAL) BY MOUTH DAILY. 90 tablet 1  . Probiotic Product (PROBIOTIC DAILY PO) Take 1 capsule by mouth daily. ULTRA FLORA    . rOPINIRole (REQUIP) 0.5 MG tablet Take 2 tabs by mouth after dinner each night. 180 tablet 1  . vitamin C (ASCORBIC ACID) 500 MG tablet Take 500 mg by mouth daily.      Marland Kitchen doxycycline (VIBRAMYCIN) 100 MG capsule Take 1 capsule (100 mg total) by mouth 2 (two) times daily. (Patient not taking: Reported on 06/13/2015) 14 capsule 0  . methylPREDNISolone (MEDROL DOSEPAK) 4 MG TBPK tablet Take as directed (Patient not taking: Reported on 06/13/2015) 21 tablet 0  . [DISCONTINUED] loratadine (CLARITIN) 10 MG tablet Take 10 mg by mouth daily as needed.       Current Facility-Administered Medications on File Prior to Visit  Medication Dose Route Frequency Provider Last Rate Last Dose  . 0.9 %  sodium chloride infusion  500 mL Intravenous Continuous Lafayette Dragon, MD        Past Medical History  Diagnosis Date  . MVP (mitral valve prolapse)     documented on 2 D ECHO  . Allergic rhinitis   . Glaucoma     SUSPECT  . GERD (gastroesophageal reflux disease)   . Hypothyroidism   . Hiatal hernia   . Hyperlipidemia     LDL goal = < 100    Past Surgical History  Procedure Laterality Date  . Total abdominal hysterectomy      for Endometriosis; no BSO, Dr Colin Ina  . Appendectomy      with TAH  . Cardiac catheterization  1989    negative  . Flexible sigmoidoscopy  1999     Dr Olevia Perches  . Knee arthroscopy  2001, 2005    Dr Percell Miller   bilat  . Colonoscopy  2005    Dr Olevia Perches  . Esophageal dilation   07/04/2011    Dr Olevia Perches  . Upper gi endoscopy  01/2013    Dr Collene Mares  . Colonoscopy  01/2013    Dr Collene Mares    Social History   Social History  . Marital Status: Married    Spouse Name: Jeneen Rinks  . Number of Children: Y  . Years of Education: N/A   Occupational History  . retired from [employer]  . Smoking status: Never Smoker   . Smokeless tobacco: Never Used  . Alcohol Use: Yes     Comment:  rarely  . Drug Use: No  . Sexual Activity: Not Asked   Other Topics  Concern  . None   Social History Narrative    Family History  Problem Relation Age of Onset  . Lung cancer Father     Asbestos with mesothelioma  . Heart disease Paternal Grandmother     ? etiology  . Diabetes Paternal Grandmother   . Diabetes Brother   . Uterine cancer Paternal Aunt   . Stroke Paternal Grandfather     > 3  . Heart attack Brother 26  . Allergies Sister   . Aneurysm Mother     congenital  . Stroke Brother     two strokes  . Barrett's esophagus Brother     Review of Systems  Constitutional: Negative for fever, chills, appetite change and unexpected weight change.       Gets hot and cold - alternating, energy level low  HENT: Negative for hearing loss and tinnitus.   Eyes: Negative for visual disturbance.  Respiratory: Negative for cough, shortness of breath and wheezing.   Cardiovascular: Negative for chest pain, palpitations and leg swelling.  Gastrointestinal: Negative for abdominal pain, diarrhea, constipation and blood in stool.       Rare gerd  Genitourinary: Negative for dysuria and hematuria.  Neurological: Positive for light-headedness (occasional). Negative for headaches.  Psychiatric/Behavioral: Negative for dysphoric mood. The patient is not nervous/anxious.        Objective:   Filed Vitals:   06/13/15 1358  BP: 116/72  Pulse: 71  Temp: 98.1 F (36.7 C)   Filed Weights   06/13/15 1358  Weight: 146 lb (66.225 kg)   Body mass index is  23.58 kg/(m^2).   Physical Exam Constitutional: She appears well-developed and well-nourished. No distress.  HENT:  Head: Normocephalic and atraumatic.  Right Ear: External ear normal. Normal ear canal and TM Left Ear: External ear normal.  Normal ear canal and TM Mouth/Throat: Oropharynx is clear and moist.  Eyes: Conjunctivae and EOM are normal.  Neck: Neck supple. No tracheal deviation present. No thyromegaly present.  No carotid bruit  Cardiovascular: Normal rate, regular rhythm and normal heart sounds.   No murmur heard.  No edema. Pulmonary/Chest: Effort normal and breath sounds normal. No respiratory distress. She has no wheezes. She has no rales.  Breast: deferred to Gyn Abdominal: Soft. She exhibits no distension. There is no tenderness.  Lymphadenopathy: She has no cervical adenopathy.  Skin: Skin is warm and dry. She is not diaphoretic.  Psychiatric: She has a normal mood and affect. Her behavior is normal.         Assessment & Plan:   Wellness Exam: Immunizations - up to date - had a pneumonia vaccine at [facility]  Colonoscopy Up to date  Mammogram  Up to date  dexa  Up to date  89 - no longer sees gyn Eye exam  Up to date  Hearing loss  -mild, nothing significant Memory concerns/difficulties  - none Independent of ADLs --  Fully    Patient received copy of preventative screening tests/immunizations recommended for the next 5-10 years.  See Problem List for Assessment and Plan of chronic medical problems.

## 2015-06-13 NOTE — Patient Instructions (Signed)
Elizabeth Espinoza , Thank you for taking time to come for your Medicare Wellness Visit. I appreciate your ongoing commitment to your health goals. Please review the following plan we discussed and let me know if I can assist you in the future.   These are the goals we discussed: Goals    None      This is a list of the screening recommended for you and due dates:  Health Maintenance  Topic Date Due  . Pneumonia vaccines (1 of 2 - PCV13) Done at [facility]  . Flu Shot  08/23/2015  . Mammogram  05/22/2017  . Tetanus Vaccine  02/10/2022  . Colon Cancer Screening  05/04/2023  . DEXA scan (bone density measurement)  Completed  . Shingles Vaccine  Completed    Health Maintenance, Female Adopting a healthy lifestyle and getting preventive care can go a long way to promote health and wellness. Talk with your health care provider about what schedule of regular examinations is right for you. This is a good chance for you to check in with your provider about disease prevention and staying healthy. In between checkups, there are plenty of things you can do on your own. Experts have done a lot of research about which lifestyle changes and preventive measures are most likely to keep you healthy. Ask your health care provider for more information. WEIGHT AND DIET  Eat a healthy diet  Be sure to include plenty of vegetables, fruits, low-fat dairy products, and lean protein.  Do not eat a lot of foods high in solid fats, added sugars, or salt.  Get regular exercise. This is one of the most important things you can do for your health.  Most adults should exercise for at least 150 minutes each week. The exercise should increase your heart rate and make you sweat (moderate-intensity exercise).  Most adults should also do strengthening exercises at least twice a week. This is in addition to the moderate-intensity exercise.  Maintain a healthy weight  Body mass index (BMI) is a measurement that can be used to  identify possible weight problems. It estimates body fat based on height and weight. Your health care provider can help determine your BMI and help you achieve or maintain a healthy weight.  For females 93 years of age and older:   A BMI below 18.5 is considered underweight.  A BMI of 18.5 to 24.9 is normal.  A BMI of 25 to 29.9 is considered overweight.  A BMI of 30 and above is considered obese.  Watch levels of cholesterol and blood lipids  You should start having your blood tested for lipids and cholesterol at 74 years of age, then have this test every 5 years.  You may need to have your cholesterol levels checked more often if:  Your lipid or cholesterol levels are high.  You are older than 74 years of age.  You are at high risk for heart disease.  CANCER SCREENING   Lung Cancer  Lung cancer screening is recommended for adults 72-58 years old who are at high risk for lung cancer because of a history of smoking.  A yearly low-dose CT scan of the lungs is recommended for people who:  Currently smoke.  Have quit within the past 15 years.  Have at least a 30-pack-year history of smoking. A pack year is smoking an average of one pack of cigarettes a day for 1 year.  Yearly screening should continue until it has been 15 years since  you quit.  Yearly screening should stop if you develop a health problem that would prevent you from having lung cancer treatment.  Breast Cancer  Practice breast self-awareness. This means understanding how your breasts normally appear and feel.  It also means doing regular breast self-exams. Let your health care provider know about any changes, no matter how small.  If you are in your 20s or 30s, you should have a clinical breast exam (CBE) by a health care provider every 1-3 years as part of a regular health exam.  If you are 64 or older, have a CBE every year. Also consider having a breast X-ray (mammogram) every year.  If you have a  family history of breast cancer, talk to your health care provider about genetic screening.  If you are at high risk for breast cancer, talk to your health care provider about having an MRI and a mammogram every year.  Breast cancer gene (BRCA) assessment is recommended for women who have family members with BRCA-related cancers. BRCA-related cancers include:  Breast.  Ovarian.  Tubal.  Peritoneal cancers.  Results of the assessment will determine the need for genetic counseling and BRCA1 and BRCA2 testing. Cervical Cancer Your health care provider may recommend that you be screened regularly for cancer of the pelvic organs (ovaries, uterus, and vagina). This screening involves a pelvic examination, including checking for microscopic changes to the surface of your cervix (Pap test). You may be encouraged to have this screening done every 3 years, beginning at age 37.  For women ages 65-65, health care providers may recommend pelvic exams and Pap testing every 3 years, or they may recommend the Pap and pelvic exam, combined with testing for human papilloma virus (HPV), every 5 years. Some types of HPV increase your risk of cervical cancer. Testing for HPV may also be done on women of any age with unclear Pap test results.  Other health care providers may not recommend any screening for nonpregnant women who are considered low risk for pelvic cancer and who do not have symptoms. Ask your health care provider if a screening pelvic exam is right for you.  If you have had past treatment for cervical cancer or a condition that could lead to cancer, you need Pap tests and screening for cancer for at least 20 years after your treatment. If Pap tests have been discontinued, your risk factors (such as having a new sexual partner) need to be reassessed to determine if screening should resume. Some women have medical problems that increase the chance of getting cervical cancer. In these cases, your health  care provider may recommend more frequent screening and Pap tests. Colorectal Cancer  This type of cancer can be detected and often prevented.  Routine colorectal cancer screening usually begins at 74 years of age and continues through 74 years of age.  Your health care provider may recommend screening at an earlier age if you have risk factors for colon cancer.  Your health care provider may also recommend using home test kits to check for hidden blood in the stool.  A small camera at the end of a tube can be used to examine your colon directly (sigmoidoscopy or colonoscopy). This is done to check for the earliest forms of colorectal cancer.  Routine screening usually begins at age 31.  Direct examination of the colon should be repeated every 5-10 years through 74 years of age. However, you may need to be screened more often if early forms of  precancerous polyps or small growths are found. Skin Cancer  Check your skin from head to toe regularly.  Tell your health care provider about any new moles or changes in moles, especially if there is a change in a mole's shape or color.  Also tell your health care provider if you have a mole that is larger than the size of a pencil eraser.  Always use sunscreen. Apply sunscreen liberally and repeatedly throughout the day.  Protect yourself by wearing long sleeves, pants, a wide-brimmed hat, and sunglasses whenever you are outside. HEART DISEASE, DIABETES, AND HIGH BLOOD PRESSURE   High blood pressure causes heart disease and increases the risk of stroke. High blood pressure is more likely to develop in:  People who have blood pressure in the high end of the normal range (130-139/85-89 mm Hg).  People who are overweight or obese.  People who are African American.  If you are 63-82 years of age, have your blood pressure checked every 3-5 years. If you are 42 years of age or older, have your blood pressure checked every year. You should have  your blood pressure measured twice--once when you are at a hospital or clinic, and once when you are not at a hospital or clinic. Record the average of the two measurements. To check your blood pressure when you are not at a hospital or clinic, you can use:  An automated blood pressure machine at a pharmacy.  A home blood pressure monitor.  If you are between 69 years and 57 years old, ask your health care provider if you should take aspirin to prevent strokes.  Have regular diabetes screenings. This involves taking a blood sample to check your fasting blood sugar level.  If you are at a normal weight and have a low risk for diabetes, have this test once every three years after 74 years of age.  If you are overweight and have a high risk for diabetes, consider being tested at a younger age or more often. PREVENTING INFECTION  Hepatitis B  If you have a higher risk for hepatitis B, you should be screened for this virus. You are considered at high risk for hepatitis B if:  You were born in a country where hepatitis B is common. Ask your health care provider which countries are considered high risk.  Your parents were born in a high-risk country, and you have not been immunized against hepatitis B (hepatitis B vaccine).  You have HIV or AIDS.  You use needles to inject street drugs.  You live with someone who has hepatitis B.  You have had sex with someone who has hepatitis B.  You get hemodialysis treatment.  You take certain medicines for conditions, including cancer, organ transplantation, and autoimmune conditions. Hepatitis C  Blood testing is recommended for:  Everyone born from [birth year] through 1964.  Anyone with known risk factors for hepatitis C. Sexually transmitted infections (STIs)  You should be screened for sexually transmitted infections (STIs) including gonorrhea and chlamydia if:  You are sexually active and are younger than 74 years of age.  You are older than  74 years of age and your health care provider tells you that you are at risk for this type of infection.  Your sexual activity has changed since you were last screened and you are at an increased risk for chlamydia or gonorrhea. Ask your health care provider if you are at risk.  If you do not have HIV, but are at risk,  it may be recommended that you take a prescription medicine daily to prevent HIV infection. This is called pre-exposure prophylaxis (PrEP). You are considered at risk if:  You are sexually active and do not regularly use condoms or know the HIV status of your partner(s).  You take drugs by injection.  You are sexually active with a partner who has HIV. Talk with your health care provider about whether you are at high risk of being infected with HIV. If you choose to begin PrEP, you should first be tested for HIV. You should then be tested every 3 months for as long as you are taking PrEP.  PREGNANCY   If you are premenopausal and you may become pregnant, ask your health care provider about preconception counseling.  If you may become pregnant, take 400 to 800 micrograms (mcg) of folic acid every day.  If you want to prevent pregnancy, talk to your health care provider about birth control (contraception). OSTEOPOROSIS AND MENOPAUSE   Osteoporosis is a disease in which the bones lose minerals and strength with aging. This can result in serious bone fractures. Your risk for osteoporosis can be identified using a bone density scan.  If you are 72 years of age or older, or if you are at risk for osteoporosis and fractures, ask your health care provider if you should be screened.  Ask your health care provider whether you should take a calcium or vitamin D supplement to lower your risk for osteoporosis.  Menopause may have certain physical symptoms and risks.  Hormone replacement therapy may reduce some of these symptoms and risks. Talk to your health care provider about  whether hormone replacement therapy is right for you.  HOME CARE INSTRUCTIONS   Schedule regular health, dental, and eye exams.  Stay current with your immunizations.   Do not use any tobacco products including cigarettes, chewing tobacco, or electronic cigarettes.  If you are pregnant, do not drink alcohol.  If you are breastfeeding, limit how much and how often you drink alcohol.  Limit alcohol intake to no more than 1 drink per day for nonpregnant women. One drink equals 12 ounces of beer, 5 ounces of wine, or 1 ounces of hard liquor.  Do not use street drugs.  Do not share needles.  Ask your health care provider for help if you need support or information about quitting drugs.  Tell your health care provider if you often feel depressed.  Tell your health care provider if you have ever been abused or do not feel safe at home.   This information is not intended to replace advice given to you by your health care provider. Make sure you discuss any questions you have with your health care provider.   Document Released: 07/24/2010 Document Revised: 01/29/2014 Document Reviewed: 12/10/2012 Elsevier Interactive Patient Education [employer].

## 2015-06-16 ENCOUNTER — Other Ambulatory Visit: Payer: Self-pay | Admitting: Internal Medicine

## 2015-06-16 ENCOUNTER — Encounter: Payer: Self-pay | Admitting: Internal Medicine

## 2015-06-16 DIAGNOSIS — E038 Other specified hypothyroidism: Secondary | ICD-10-CM

## 2015-06-16 MED ORDER — LEVOTHYROXINE SODIUM 100 MCG PO TABS: ORAL_TABLET | ORAL | Status: DC

## 2015-06-18 ENCOUNTER — Other Ambulatory Visit: Payer: Self-pay | Admitting: Internal Medicine

## 2015-06-28 DIAGNOSIS — L400 Psoriasis vulgaris: Secondary | ICD-10-CM | POA: Diagnosis not present

## 2015-06-28 DIAGNOSIS — L239 Allergic contact dermatitis, unspecified cause: Secondary | ICD-10-CM | POA: Diagnosis not present

## 2015-08-02 ENCOUNTER — Other Ambulatory Visit: Payer: Self-pay | Admitting: Internal Medicine

## 2015-08-10 ENCOUNTER — Encounter: Payer: Self-pay | Admitting: Internal Medicine

## 2015-08-11 ENCOUNTER — Other Ambulatory Visit (INDEPENDENT_AMBULATORY_CARE_PROVIDER_SITE_OTHER): Payer: Medicare Other

## 2015-08-11 DIAGNOSIS — E039 Hypothyroidism, unspecified: Secondary | ICD-10-CM

## 2015-08-11 LAB — TSH: TSH: 1.4 u[IU]/mL (ref 0.35–4.50)

## 2015-08-12 ENCOUNTER — Other Ambulatory Visit: Payer: Self-pay | Admitting: *Deleted

## 2015-08-12 MED ORDER — LEVOTHYROXINE SODIUM 100 MCG PO TABS: ORAL_TABLET | ORAL | Status: DC

## 2015-10-07 DIAGNOSIS — Z23 Encounter for immunization: Secondary | ICD-10-CM | POA: Diagnosis not present

## 2015-10-12 DIAGNOSIS — H2513 Age-related nuclear cataract, bilateral: Secondary | ICD-10-CM | POA: Diagnosis not present

## 2015-10-12 DIAGNOSIS — H10413 Chronic giant papillary conjunctivitis, bilateral: Secondary | ICD-10-CM | POA: Diagnosis not present

## 2015-10-12 DIAGNOSIS — H40013 Open angle with borderline findings, low risk, bilateral: Secondary | ICD-10-CM | POA: Diagnosis not present

## 2015-10-12 DIAGNOSIS — H04123 Dry eye syndrome of bilateral lacrimal glands: Secondary | ICD-10-CM | POA: Diagnosis not present

## 2015-12-09 DIAGNOSIS — M25562 Pain in left knee: Secondary | ICD-10-CM | POA: Diagnosis not present

## 2015-12-19 ENCOUNTER — Encounter: Payer: Self-pay | Admitting: Internal Medicine

## 2015-12-19 ENCOUNTER — Ambulatory Visit (INDEPENDENT_AMBULATORY_CARE_PROVIDER_SITE_OTHER): Payer: Medicare Other | Admitting: Internal Medicine

## 2015-12-19 VITALS — BP 136/60 | HR 74 | Temp 97.9°F | Resp 16 | Wt 151.0 lb

## 2015-12-19 DIAGNOSIS — B029 Zoster without complications: Secondary | ICD-10-CM | POA: Diagnosis not present

## 2015-12-19 DIAGNOSIS — B349 Viral infection, unspecified: Secondary | ICD-10-CM | POA: Diagnosis not present

## 2015-12-19 MED ORDER — HYDROCODONE-HOMATROPINE 5-1.5 MG/5ML PO SYRP: 5.0000 mL | ORAL_SOLUTION | Freq: Four times a day (QID) | ORAL | 0 refills | Status: DC | PRN

## 2015-12-19 NOTE — Assessment & Plan Note (Signed)
Her infection is likely viral in nature Symptomatic treatment Start flonase/nasocort Continue mucinex Tylenol Delsym cough syrup or hycodan if covered for night Rest, fluids Call if no improvement

## 2015-12-19 NOTE — Patient Instructions (Signed)
Start flonase  Try delsym syrup or the prescription hycodan.   Take the benzoate for your cough.   If your symptoms worsen or fail to improve, please contact our office for further instruction, or in case of emergency go directly to the emergency room at the closest medical facility.   General Recommendations:    Please drink plenty of fluids.  Get plenty of rest   Sleep in humidified air  Use saline nasal sprays  Netti pot  OTC Medications:  Decongestants - helps relieve congestion   Flonase (generic fluticasone) or Nasacort (generic triamcinolone) - please make sure to use the "cross-over" technique at a 45 degree angle towards the opposite eye as opposed to straight up the nasal passageway.   Sudafed (generic pseudoephedrine - Note this is the one that is available behind the pharmacy counter); Products with phenylephrine (-PE) may also be used but is often not as effective as pseudoephedrine.   If you have HIGH BLOOD PRESSURE - Coricidin HBP; AVOID any product that is -D as this contains pseudoephedrine which may increase your blood pressure.  Afrin (oxymetazoline) every 6-8 hours for up to 3 days.  Allergies - helps relieve runny nose, itchy eyes and sneezing   Claritin (generic loratidine), Allegra (fexofenidine), or Zyrtec (generic cyrterizine) for runny nose. These medications should not cause drowsiness.  Note - Benadryl (generic diphenhydramine) may be used however may cause drowsiness  Cough -   Delsym or Robitussin (generic dextromethorphan)  Expectorants - helps loosen mucus to ease removal   Mucinex (generic guaifenesin) as directed on the package.  Headaches / General Aches   Tylenol (generic acetaminophen) - DO NOT EXCEED 3 grams (3,000 mg) in a 24 hour time period  Advil/Motrin (generic ibuprofen)  Sore Throat -   Salt water gargle   Chloraseptic (generic benzocaine) spray or lozenges / Sucrets (generic dyclonine)

## 2015-12-19 NOTE — Progress Notes (Signed)
Subjective:    Patient ID: Elizabeth Espinoza, female    DOB: May 13, 1940, 75 y.o.   MRN: [id number]  HPI She is here for an acute visit for cold symptoms.   Her symptoms started one week ago and started with a sore throat, her ears are stopped up and feel plugged and her hearing feels worse.  Her throat is better, but still raw from coughing.  She has nasal congestion, sinus pressure, sneezing, cough from PND and headaches.  She has not had any fever, SOB or wheeze.   She gargled with salt water.  She has taken mucinex.    She also has a rash on her right sided chest that itches.   Medications and allergies reviewed with patient and updated if appropriate.  Patient Active Problem List   Diagnosis Date Noted  . Hair loss 06/13/2015  . Prediabetes 02/24/2015  . Dyspnea 09/29/2014  . Abnormal chest x-ray 12/11/2012  . Barrett's esophagus 12/11/2012  . Anemia 12/09/2012  . RLS (restless legs syndrome) 04/13/2011  . 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 UNSPECIFIED MENOPAUSAL&POSTMENOPAUSAL DISORDER 09/01/2008  . ARTHRALGIA 09/01/2008  . HIATAL HERNIA 07/31/2007  . MITRAL VALVE PROLAPSE, HX OF 07/31/2007  . ALLERGIC RHINITIS WITH CONJUNCTIVITIS 06/19/2007  . GASTROESOPHAGEAL REFLUX DISEASE, CHRONIC 06/19/2007  . Hypothyroidism 02/07/2007  . HYPERLIPIDEMIA 02/07/2007    Current Outpatient Prescriptions on File Prior to Visit  Medication Sig Dispense Refill  . B Complex Vitamins (VITAMIN B COMPLEX PO) Take 1 tablet by mouth daily.    . Calcium Carbonate-Vit D-Min (CALCIUM 1200 PO) Take by mouth daily.    . Cholecalciferol (VITAMIN D-3 PO) Take by mouth daily.    . Cyanocobalamin (VITAMIN B-12 SL) Place under the tongue daily.      Marland Kitchen Fexofenadine HCl (ALLEGRA PO) Take by mouth.    . levothyroxine (SYNTHROID, LEVOTHROID) 100 MCG tablet Take 1 tablet daily for 6 days of the week, no medication one day of the week 90 tablet 2  . pravastatin (PRAVACHOL)  40 MG tablet Take 1 tablet by mouth  daily 90 tablet 1  . Probiotic Product (PROBIOTIC DAILY PO) Take 1 capsule by mouth daily. ULTRA FLORA    . rOPINIRole (REQUIP) 0.5 MG tablet TAKE 2 TABLETS BY MOUTH  AFTER DINNER EACH NIGHT. 180 tablet 1  . [DISCONTINUED] loratadine (CLARITIN) 10 MG tablet Take 10 mg by mouth daily as needed.       Current Facility-Administered Medications on File Prior to Visit  Medication Dose Route Frequency Provider Last Rate Last Dose  . 0.9 %  sodium chloride infusion  500 mL Intravenous Continuous Lafayette Dragon, MD        Past Medical History:  Diagnosis Date  . Allergic rhinitis   . GERD (gastroesophageal reflux disease)   . Glaucoma    SUSPECT  . Hiatal hernia   . Hyperlipidemia    LDL goal = < 100  . Hypothyroidism   . MVP (mitral valve prolapse)    documented on 2 D ECHO    Past Surgical History:  Procedure Laterality Date  . APPENDECTOMY     with TAH  . CARDIAC CATHETERIZATION  1989   negative  . COLONOSCOPY  2005   Dr Olevia Perches  . COLONOSCOPY  01/2013   Dr Collene Mares  . esophageal dilation  07/04/2011   Dr Olevia Perches  . FLEXIBLE SIGMOIDOSCOPY  1999    Dr Olevia Perches  . KNEE ARTHROSCOPY  2001, 2005  Dr Percell Miller   bilat  . TOTAL ABDOMINAL HYSTERECTOMY     for Endometriosis; no BSO, Dr Colin Ina  . UPPER GI ENDOSCOPY  01/2013   Dr Collene Mares    Social History   Social History  . Marital status: Married    Spouse name: Jeneen Rinks  . Number of children: Y  . Years of education: N/A   Occupational History  . retired from [employer]  . Smoking status: Never Smoker  . Smokeless tobacco: Never Used  . Alcohol use Yes     Comment:  rarely  . Drug use: No  . Sexual activity: Not on file   Other Topics Concern  . Not on file   Social History Narrative  . No narrative on file    Family History  Problem Relation Age of Onset  . Lung cancer Father     Asbestos with mesothelioma  . Heart disease Paternal Grandmother     ?  etiology  . Diabetes Paternal Grandmother   . Diabetes Brother   . Uterine cancer Paternal Aunt   . Stroke Paternal Grandfather     > 41  . Heart attack Brother 30  . Allergies Sister   . Aneurysm Mother     congenital  . Stroke Brother     two strokes  . Barrett's esophagus Brother     Review of Systems  Constitutional: Positive for appetite change (decreased). Negative for fever.  HENT: Positive for congestion, hearing loss (decreased), sinus pressure, sneezing and sore throat (improved). Negative for ear pain (ear pressure) and sinus pain.   Respiratory: Positive for cough (yellow mucus). Negative for shortness of breath and wheezing.   Cardiovascular: Negative for chest pain.  Gastrointestinal: Negative for diarrhea and nausea.  Neurological: Positive for headaches. Negative for dizziness and light-headedness.       Objective:   Vitals:   12/19/15 1618  BP: 136/60  Pulse: 74  Resp: 16  Temp: 97.9 F (36.6 C)   Filed Weights   12/19/15 1618  Weight: 151 lb (68.5 kg)   Body mass index is 24.37 kg/m.   Physical Exam GENERAL APPEARANCE: Appears stated age, well appearing, NAD EYES: conjunctiva clear, no icterus HEENT: bilateral tympanic membranes and ear canals normal, oropharynx with no erythema, no thyromegaly, trachea midline, no cervical or supraclavicular lymphadenopathy LUNGS: Clear to auscultation without wheeze or crackles, unlabored breathing, good air entry bilaterally HEART: Normal S1,S2 without murmurs EXTREMITIES: Without clubbing, cyanosis, or edema SKIN: cluster of papules on right upper chest, a couple of which have small scabs       Assessment & Plan:   See Problem List for Assessment and Plan of chronic medical problems.

## 2015-12-19 NOTE — Assessment & Plan Note (Signed)
Mild rash R upper chest Only with itching Will treat symptomatically with anti-itch cream - steroid cream or benadryl

## 2015-12-19 NOTE — Progress Notes (Signed)
Pre visit review using our clinic review tool, if applicable. No additional management support is needed unless otherwise documented below in the visit note. 

## 2015-12-23 DIAGNOSIS — M25562 Pain in left knee: Secondary | ICD-10-CM | POA: Diagnosis not present

## 2015-12-28 ENCOUNTER — Telehealth: Payer: Self-pay | Admitting: Internal Medicine

## 2015-12-28 NOTE — Telephone Encounter (Signed)
Patient called and stated that that spot she had on your chest was still broke out. She needed to know if she needed to come in again. If Dr. Quay Burow did think it was shingles. She states she has an appointment at the surgery center and they told her she could not come if she had shingles. Please advise or follow up with patient. Thank you.

## 2015-12-28 NOTE — Telephone Encounter (Signed)
Please advise.

## 2015-12-28 NOTE — Telephone Encounter (Signed)
Has there been any change in the rash.  Spread?  Ok to come in, but if it is shingles there may not be much we can do.

## 2015-12-29 ENCOUNTER — Other Ambulatory Visit: Payer: Self-pay

## 2015-12-29 NOTE — Telephone Encounter (Signed)
LVM for pt to call back to schedule appt.

## 2015-12-29 NOTE — Telephone Encounter (Signed)
Alclometasone-dipropionate ointment does help when she uses it. Only itches, no pain and has not spread. We have a couple openings tomorrow, do you feel like she should come in to be evaluated?

## 2015-12-29 NOTE — Telephone Encounter (Signed)
Have her come in

## 2015-12-29 NOTE — Telephone Encounter (Signed)
Spoke with pt, appt scheduled.

## 2015-12-30 ENCOUNTER — Telehealth: Payer: Self-pay | Admitting: *Deleted

## 2015-12-30 ENCOUNTER — Ambulatory Visit (INDEPENDENT_AMBULATORY_CARE_PROVIDER_SITE_OTHER): Payer: Medicare Other | Admitting: Internal Medicine

## 2015-12-30 ENCOUNTER — Encounter: Payer: Self-pay | Admitting: Internal Medicine

## 2015-12-30 ENCOUNTER — Other Ambulatory Visit: Payer: Self-pay | Admitting: Internal Medicine

## 2015-12-30 VITALS — BP 126/72 | HR 92 | Temp 98.7°F | Resp 16 | Wt 150.0 lb

## 2015-12-30 DIAGNOSIS — B029 Zoster without complications: Secondary | ICD-10-CM

## 2015-12-30 MED ORDER — CLOBETASOL PROP EMOLLIENT BASE 0.05 % EX CREA: 1.0000 "application " | TOPICAL_CREAM | Freq: Two times a day (BID) | CUTANEOUS | 0 refills | Status: DC

## 2015-12-30 NOTE — Progress Notes (Signed)
Subjective:    Patient ID: Elizabeth Espinoza, female    DOB: [birth year], 75 y.o.   MRN: [id number]  HPI She is here for an acute visit.   Rash:  When she was last here 11/27 with cold symtpoms and had a rash on her right upper chest.  It looked like shingles.  She was asymptomatic and I did not give any treatment.  She has had some pain in her neck and right arm, but thought that was from exercising.  That pain has gotten betrer.  The rash does not itch or hurt.  She has been applying otc hydrocortisone cream on the rash and it has not changed.  Some days it looks better than others.    Medications and allergies reviewed with patient and updated if appropriate.  Patient Active Problem List   Diagnosis Date Noted  . Herpes zoster without complication 40/98/1191  . Viral illness 12/19/2015  . Hair loss 06/13/2015  . Prediabetes 02/24/2015  . Dyspnea 09/29/2014  . Abnormal chest x-ray 12/11/2012  . Barrett's esophagus 12/11/2012  . Anemia 12/09/2012  . RLS (restless legs syndrome) 04/13/2011  . 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 UNSPECIFIED MENOPAUSAL&POSTMENOPAUSAL DISORDER 09/01/2008  . ARTHRALGIA 09/01/2008  . HIATAL HERNIA 07/31/2007  . MITRAL VALVE PROLAPSE, HX OF 07/31/2007  . ALLERGIC RHINITIS WITH CONJUNCTIVITIS 06/19/2007  . GASTROESOPHAGEAL REFLUX DISEASE, CHRONIC 06/19/2007  . Hypothyroidism 02/07/2007  . HYPERLIPIDEMIA 02/07/2007    Current Outpatient Prescriptions on File Prior to Visit  Medication Sig Dispense Refill  . B Complex Vitamins (VITAMIN B COMPLEX PO) Take 1 tablet by mouth daily.    . Calcium Carbonate-Vit D-Min (CALCIUM 1200 PO) Take by mouth daily.    . Cholecalciferol (VITAMIN D-3 PO) Take by mouth daily.    . Cyanocobalamin (VITAMIN B-12 SL) Place under the tongue daily.      Marland Kitchen Fexofenadine HCl (ALLEGRA PO) Take by mouth.    Marland Kitchen HYDROcodone-homatropine (HYCODAN) 5-1.5 MG/5ML syrup Take 5 mLs by mouth every 6 (six)  hours as needed for cough. 120 mL 0  . levothyroxine (SYNTHROID, LEVOTHROID) 100 MCG tablet Take 1 tablet daily for 6 days of the week, no medication one day of the week 90 tablet 2  . MAGNESIUM CARBONATE PO Take by mouth.    . Probiotic Product (PROBIOTIC DAILY PO) Take 1 capsule by mouth daily. ULTRA FLORA    . rOPINIRole (REQUIP) 0.5 MG tablet TAKE 2 TABLETS BY MOUTH  AFTER DINNER EACH NIGHT. 180 tablet 1  . [DISCONTINUED] loratadine (CLARITIN) 10 MG tablet Take 10 mg by mouth daily as needed.       Current Facility-Administered Medications on File Prior to Visit  Medication Dose Route Frequency Provider Last Rate Last Dose  . 0.9 %  sodium chloride infusion  500 mL Intravenous Continuous Lafayette Dragon, MD        Past Medical History:  Diagnosis Date  . Allergic rhinitis   . GERD (gastroesophageal reflux disease)   . Glaucoma    SUSPECT  . Hiatal hernia   . Hyperlipidemia    LDL goal = < 100  . Hypothyroidism   . MVP (mitral valve prolapse)    documented on 2 D ECHO    Past Surgical History:  Procedure Laterality Date  . APPENDECTOMY     with TAH  . CARDIAC CATHETERIZATION  1989   negative  . COLONOSCOPY  2005   Dr Olevia Perches  . COLONOSCOPY  01/2013  Dr Collene Mares  . esophageal dilation  07/04/2011   Dr Olevia Perches  . FLEXIBLE SIGMOIDOSCOPY  1999    Dr Olevia Perches  . KNEE ARTHROSCOPY  2001, 2005   Dr Percell Miller   bilat  . TOTAL ABDOMINAL HYSTERECTOMY     for Endometriosis; no BSO, Dr Colin Ina  . UPPER GI ENDOSCOPY  01/2013   Dr Collene Mares    Social History   Social History  . Marital status: Married    Spouse name: Jeneen Rinks  . Number of children: Y  . Years of education: N/A   Occupational History  . retired from [employer]  . Smoking status: Never Smoker  . Smokeless tobacco: Never Used  . Alcohol use Yes     Comment:  rarely  . Drug use: No  . Sexual activity: Not on file   Other Topics Concern  . Not on file   Social History Narrative  . No  narrative on file    Family History  Problem Relation Age of Onset  . Lung cancer Father     Asbestos with mesothelioma  . Heart disease Paternal Grandmother     ? etiology  . Diabetes Paternal Grandmother   . Diabetes Brother   . Uterine cancer Paternal Aunt   . Stroke Paternal Grandfather     > 36  . Heart attack Brother 56  . Allergies Sister   . Aneurysm Mother     congenital  . Stroke Brother     two strokes  . Barrett's esophagus Brother     Review of Systems  Constitutional: Negative for fatigue and fever.  Musculoskeletal: Positive for neck pain (radiation down right arm).  Skin: Positive for rash.  Neurological: Negative for numbness.       Objective:   Vitals:   12/30/15 1305  BP: 126/72  Pulse: 92  Resp: 16  Temp: 98.7 F (37.1 C)   Filed Weights   12/30/15 1305  Weight: 150 lb (68 kg)   Body mass index is 24.21 kg/m.   Physical Exam  Constitutional: She appears well-developed and well-nourished. No distress.  Musculoskeletal: She exhibits no edema.  Skin: Skin is warm and dry. Rash (maculopapular with tiny blister on right upper chest - no where else, one lesion appears scabbed) noted. She is not diaphoretic.          Assessment & Plan:   See Problem List for Assessment and Plan of chronic medical problems.

## 2015-12-30 NOTE — Progress Notes (Signed)
Pre visit review using our clinic review tool, if applicable. No additional management support is needed unless otherwise documented below in the visit note. 

## 2015-12-30 NOTE — Telephone Encounter (Signed)
Rec'd call pt states MD sent in Clobetasol cream, and rx was $96, and she can't afford. She is wanting to know if she can use the Clobetasol Propionate solution that was previously rx for her...Elizabeth Espinoza

## 2015-12-30 NOTE — Patient Instructions (Addendum)
Use the prescription cream as directed for up to two weeks.    Call if the rash does not go away completely.

## 2015-12-30 NOTE — Telephone Encounter (Signed)
Notified pt w/MD response...Elizabeth Espinoza

## 2015-12-30 NOTE — Telephone Encounter (Signed)
Yes ok

## 2015-12-30 NOTE — Assessment & Plan Note (Signed)
Rash appears to be shingles Had shingles vaccine No symptoms Will try a high dose steroid cream - use BID for up to 2 weeks Call if it does not resolve

## 2016-01-02 DIAGNOSIS — S83242A Other tear of medial meniscus, current injury, left knee, initial encounter: Secondary | ICD-10-CM | POA: Diagnosis not present

## 2016-01-02 DIAGNOSIS — G8918 Other acute postprocedural pain: Secondary | ICD-10-CM | POA: Diagnosis not present

## 2016-01-02 DIAGNOSIS — M94262 Chondromalacia, left knee: Secondary | ICD-10-CM | POA: Diagnosis not present

## 2016-01-02 DIAGNOSIS — M2242 Chondromalacia patellae, left knee: Secondary | ICD-10-CM | POA: Diagnosis not present

## 2016-01-02 DIAGNOSIS — S83282A Other tear of lateral meniscus, current injury, left knee, initial encounter: Secondary | ICD-10-CM | POA: Diagnosis not present

## 2016-01-02 DIAGNOSIS — S83232A Complex tear of medial meniscus, current injury, left knee, initial encounter: Secondary | ICD-10-CM | POA: Diagnosis not present

## 2016-01-02 DIAGNOSIS — S83272A Complex tear of lateral meniscus, current injury, left knee, initial encounter: Secondary | ICD-10-CM | POA: Diagnosis not present

## 2016-01-02 DIAGNOSIS — Y999 Unspecified external cause status: Secondary | ICD-10-CM | POA: Diagnosis not present

## 2016-01-02 NOTE — Telephone Encounter (Signed)
I think there was an alternative called in Friday - if not can try pended cream.

## 2016-01-02 NOTE — Telephone Encounter (Signed)
Pt states that the cream that was sent in was $96, can you send in something in replace for that.

## 2016-01-10 DIAGNOSIS — S83282D Other tear of lateral meniscus, current injury, left knee, subsequent encounter: Secondary | ICD-10-CM | POA: Diagnosis not present

## 2016-01-10 DIAGNOSIS — S83242D Other tear of medial meniscus, current injury, left knee, subsequent encounter: Secondary | ICD-10-CM | POA: Diagnosis not present

## 2016-02-14 DIAGNOSIS — S83242D Other tear of medial meniscus, current injury, left knee, subsequent encounter: Secondary | ICD-10-CM | POA: Diagnosis not present

## 2016-03-02 ENCOUNTER — Other Ambulatory Visit: Payer: Self-pay | Admitting: Internal Medicine

## 2016-03-30 DIAGNOSIS — S83242D Other tear of medial meniscus, current injury, left knee, subsequent encounter: Secondary | ICD-10-CM | POA: Diagnosis not present

## 2016-05-04 ENCOUNTER — Telehealth: Payer: Self-pay | Admitting: Internal Medicine

## 2016-05-04 MED ORDER — LEVOTHYROXINE SODIUM 100 MCG PO TABS: ORAL_TABLET | ORAL | 0 refills | Status: DC

## 2016-05-04 MED ORDER — PRAVASTATIN SODIUM 40 MG PO TABS: 40.0000 mg | ORAL_TABLET | Freq: Every day | ORAL | 0 refills | Status: DC

## 2016-05-04 NOTE — Telephone Encounter (Signed)
Spoke with pt, refill sent to new [facility]. Pt Transferred to [facility] to fix appt that was scheduled for May for Wellness.

## 2016-05-04 NOTE — Telephone Encounter (Signed)
levothyroxine (SYNTHROID, LEVOTHROID) 100 MCG tablet  Patient states she is going to be out of this medication 11 days before her appointment in May. She wanted to know if she could get samples of this medication for that 11 days. Because she states that it always changes so she does not want a whole 90 day supply sent in. Please advise. Thank you.

## 2016-06-13 ENCOUNTER — Other Ambulatory Visit: Payer: Self-pay | Admitting: Internal Medicine

## 2016-06-13 DIAGNOSIS — Z1231 Encounter for screening mammogram for malignant neoplasm of breast: Secondary | ICD-10-CM

## 2016-06-13 NOTE — Progress Notes (Addendum)
Subjective:   Elizabeth Espinoza is a 76 y.o. female who presents for Medicare Annual (Subsequent) preventive examination.  Review of Systems:  No ROS.  Medicare Wellness Visit.  Cardiac Risk Factors include: advanced age (>49mn, >>38women);dyslipidemia Sleep patterns: has frequent nighttime awakenings, gets up 1-3 times nightly to void and sleeps 4-6 hours nightly. Patient reports insomnia issues, discussed recommended sleep tips, education was attached to patient's AVS.   Home Safety/Smoke Alarms: Feels safe in home. Smoke alarms in place.    Living environment; residence and Firearm Safety: 1-story house/ trailer, no firearms. Lives with husband, no needs for DME Seat Belt Safety/Bike Helmet: Wears seat belt.   Counseling:   Eye Exam- appointment yearly Dr. GSchuyler AmorDental- appointment every 6 months  Female:   Pap- N/A       Mammo- Last 05/23/15,  BI-RADS CATEGORY  1: Negative, has upcoming appointment   Dexa scan- Last 09/15/08, normal      CCS- Last 05/03/13     Objective:     Vitals: BP 112/72   Pulse 64   Resp 20   Ht _0  (1.676 m)   Wt 151 lb (68.5 kg)   SpO2 98%   BMI 24.37 kg/m   Body mass index is 24.37 kg/m.   Tobacco History  Smoking Status  . Never Smoker  Smokeless Tobacco  . Never Used     Counseling given: Not Answered   Past Medical History:  Diagnosis Date  . Allergic rhinitis   . GERD (gastroesophageal reflux disease)   . Glaucoma    SUSPECT  . Hiatal hernia   . Hyperlipidemia    LDL goal = < 100  . Hypothyroidism   . MVP (mitral valve prolapse)    documented on 2 D ECHO   Past Surgical History:  Procedure Laterality Date  . APPENDECTOMY     with TAH  . CARDIAC CATHETERIZATION  1989   negative  . COLONOSCOPY  2005   Dr BOlevia Perches . COLONOSCOPY  01/2013   Dr mCollene Mares . esophageal dilation  07/04/2011   Dr BOlevia Perches . FLEXIBLE SIGMOIDOSCOPY  1999    Dr BOlevia Perches . KNEE ARTHROSCOPY  2001, 2005   Dr MPercell Miller  bilat  . TOTAL ABDOMINAL  HYSTERECTOMY     for Endometriosis; no BSO, Dr HColin Ina . UPPER GI ENDOSCOPY  01/2013   Dr MCollene Mares  Family History  Problem Relation Age of Onset  . Lung cancer Father        Asbestos with mesothelioma  . Heart disease Paternal Grandmother        ? etiology  . Diabetes Paternal Grandmother   . Diabetes Brother   . Uterine cancer Paternal Aunt   . Stroke Paternal Grandfather        > 533 . Heart attack Brother 668 . Allergies Sister   . Aneurysm Mother        congenital  . Stroke Brother        two strokes  . Barrett's esophagus Brother    History  Sexual Activity  . Sexual activity: Yes    Outpatient Encounter Prescriptions as of 06/14/2016  Medication Sig  . Cholecalciferol (VITAMIN D-3 PO) Take by mouth daily.  .Marland KitchenFexofenadine HCl (ALLEGRA PO) Take by mouth.  . levothyroxine (SYNTHROID, LEVOTHROID) 100 MCG tablet Take 1 tablet daily for 6 days of the week, no medication one day of the week  . MAGNESIUM CARBONATE PO  Take by mouth.  . pravastatin (PRAVACHOL) 40 MG tablet Take 1 tablet (40 mg total) by mouth daily.  . Probiotic Product (PROBIOTIC DAILY PO) Take 1 capsule by mouth daily. ULTRA FLORA  . rOPINIRole (REQUIP) 0.5 MG tablet TAKE 2 TABLETS BY MOUTH  AFTER DINNER EACH NIGHT.  . [DISCONTINUED] B Complex Vitamins (VITAMIN B COMPLEX PO) Take 1 tablet by mouth daily.  . [DISCONTINUED] Calcium Carbonate-Vit D-Min (CALCIUM 1200 PO) Take by mouth daily.  . [DISCONTINUED] Cyanocobalamin (VITAMIN B-12 SL) Place under the tongue daily.    . [DISCONTINUED] HYDROcodone-homatropine (HYCODAN) 5-1.5 MG/5ML syrup Take 5 mLs by mouth every 6 (six) hours as needed for cough. (Patient not taking: Reported on 06/14/2016)   Facility-Administered Encounter Medications as of 06/14/2016  Medication  . 0.9 %  sodium chloride infusion    Activities of Daily Living In your present state of health, do you have any difficulty performing the following activities: 06/14/2016  Hearing? N    Vision? N  Difficulty concentrating or making decisions? N  Walking or climbing stairs? N  Dressing or bathing? N  Doing errands, shopping? N  Preparing Food and eating ? N  Using the Toilet? N  In the past six months, have you accidently leaked urine? N  Do you have problems with loss of bowel control? N  Managing your Medications? N  Managing your Finances? N  Housekeeping or managing your Housekeeping? N  Some recent data might be hidden    Patient Care Team: Binnie Rail, MD as PCP - General (Internal Medicine)    Assessment:    Physical assessment deferred to PCP.  Exercise Activities and Dietary recommendations Current Exercise Habits: Structured exercise class, Type of exercise: walking;calisthenics, Frequency (Times/Week): 3, Intensity: Moderate, Exercise limited by: None identified  Diet (meal preparation, eat out, water intake, caffeinated beverages, dairy products, fruits and vegetables): in general, a "healthy" diet  , well balanced, low fat/ cholesterol, low salt eats a variety of fruits and vegetables daily, limits salt, fat/cholesterol, sugar, caffeine, drinks 6-8 glasses of water daily.  Diet education was attached to patient's AVS.    Goals    . Lose weight to reach goal of 145 pounds          Decrease the amount sweets, continue to exercise, eat healthy, enjoy life, and family      Fall Risk Fall Risk  06/14/2016 12/30/2015 12/29/2015 12/09/2012  Falls in the past year? No No No No   Depression Screen PHQ 2/9 Scores 06/14/2016 12/30/2015 12/09/2012 02/11/2012  PHQ - 2 Score 0 0 0 0     Cognitive Function        Immunization History  Administered Date(s) Administered  . Influenza Split 11/07/2010  . Influenza Whole 10/30/2009, 11/27/2011  . Influenza, High Dose Seasonal PF 10/27/2012, 11/05/2013  . Influenza-Unspecified 11/24/2014, 10/07/2015  . Pneumococcal Conjugate-13 10/07/2015  . Tdap 02/11/2012  . Zoster 01/09/2012   Screening  Tests Health Maintenance  Topic Date Due  . INFLUENZA VACCINE  08/22/2016  . PNA vac Low Risk Adult (2 of 2 - PPSV23) 10/06/2016  . TETANUS/TDAP  02/10/2022  . COLONOSCOPY  05/04/2023  . DEXA SCAN  Completed      Plan:    Continue to eat heart healthy diet (full of fruits, vegetables, whole grains, lean protein, water--limit salt, fat, and sugar intake) and increase physical activity as tolerated.  Continue doing brain stimulating activities (puzzles, reading, adult coloring books, staying active) to keep memory sharp.  I have personally reviewed and noted the following in the patient's chart:   . Medical and social history . Use of alcohol, tobacco or illicit drugs  . Current medications and supplements . Functional ability and status . Nutritional status . Physical activity . Advanced directives . List of other physicians . Vitals . Screenings to include cognitive, depression, and falls . Referrals and appointments  In addition, I have reviewed and discussed with patient certain preventive protocols, quality metrics, and best practice recommendations. A written personalized care plan for preventive services as well as general preventive health recommendations were provided to patient.     Michiel Cowboy, RN  06/14/2016   Medical screening examination/treatment/procedure(s) were performed by non-physician practitioner and as supervising physician I was immediately available for consultation/collaboration. I agree with above. Binnie Rail, MD

## 2016-06-13 NOTE — Progress Notes (Signed)
Pre visit review using our clinic review tool, if applicable. No additional management support is needed unless otherwise documented below in the visit note.

## 2016-06-14 ENCOUNTER — Ambulatory Visit (INDEPENDENT_AMBULATORY_CARE_PROVIDER_SITE_OTHER): Payer: PPO | Admitting: *Deleted

## 2016-06-14 VITALS — BP 112/72 | HR 64 | Resp 20 | Ht 66.0 in | Wt 151.0 lb

## 2016-06-14 DIAGNOSIS — Z0000 Encounter for general adult medical examination without abnormal findings: Secondary | ICD-10-CM | POA: Diagnosis not present

## 2016-06-14 NOTE — Patient Instructions (Signed)
Continue to eat heart healthy diet (full of fruits, vegetables, whole grains, lean protein, water--limit salt, fat, and sugar intake) and increase physical activity as tolerated.  Continue doing brain stimulating activities (puzzles, reading, adult coloring books, staying active) to keep memory sharp.    Elizabeth Espinoza , Thank you for taking time to come for your Medicare Wellness Visit. I appreciate your ongoing commitment to your health goals. Please review the following plan we discussed and let me know if I can assist you in the future.   These are the goals we discussed: Goals    . Lose weight to reach goal of 145 pounds          Decrease the amount sweets, continue to exercise, eat healthy, enjoy life, family, and life.       This is a list of the screening recommended for you and due dates:  Health Maintenance  Topic Date Due  . Flu Shot  08/22/2016  . Pneumonia vaccines (2 of 2 - PPSV23) 10/06/2016  . Tetanus Vaccine  02/10/2022  . Colon Cancer Screening  05/04/2023  . DEXA scan (bone density measurement)  Completed    Shoulder Range of Motion Exercises Shoulder range of motion (ROM) exercises are designed to keep the shoulder moving freely. They are often recommended for people who have shoulder pain. Phase 1 exercises When you are able, do this exercise 5-6 days per week, or as told by your health care provider. Work toward doing 2 sets of 10 swings. Pendulum Exercise  How To Do This Exercise Lying Down 1. Lie face-down on a bed with your abdomen close to the side of the bed. 2. Let your arm hang over the side of the bed. 3. Relax your shoulder, arm, and hand. 4. Slowly and gently swing your arm forward and back. Do not use your neck muscles to swing your arm. They should be relaxed. If you are struggling to swing your arm, have someone gently swing it for you. When you do this exercise for the first time, swing your arm at a 15 degree angle for 15 seconds, or swing your arm  10 times. As pain lessens over time, increase the angle of the swing to 30-45 degrees. 5. Repeat steps 1-4 with the other arm. How To Do This Exercise While Standing 1. Stand next to a sturdy chair or table and hold on to it with your hand. 1. Bend forward at the waist. 2. Bend your knees slightly. 3. Relax your other arm and let it hang limp. 4. Relax the shoulder blade of the arm that is hanging and let it drop. 5. While keeping your shoulder relaxed, use body motion to swing your arm in small circles. The first time you do this exercise, swing your arm for about 30 seconds or 10 times. When you do it next time, swing your arm for a little longer. 6. Stand up tall and relax. 7. Repeat steps 1-7, this time changing the direction of the circles. 2. Repeat steps 1-8 with the other arm. Phase 2 exercises Do these exercises 3-4 times per day on 5-6 days per week or as told by your health care provider. Work toward holding the stretch for 20 seconds. Stretching Exercise 1  1. Lift your arm straight out in front of you. 2. Bend your arm 90 degrees at the elbow (right angle) so your forearm goes across your body and looks like the letter "L." 3. Use your other arm to gently pull  the elbow forward and across your body. 4. Repeat steps 1-3 with the other arm. Stretching Exercise 2  You will need a towel or rope for this exercise. 1. Bend one arm behind your back with the palm facing outward. 2. Hold a towel with your other hand. 3. Reach the arm that holds the towel above your head, and bend that arm at the elbow. Your wrist should be behind your neck. 4. Use your free hand to grab the free end of the towel. 5. With the higher hand, gently pull the towel up behind you. 6. With the lower hand, pull the towel down behind you. 7. Repeat steps 1-6 with the other arm. Phase 3 exercises Do each of these exercises at four different times of day (sessions) every day or as told by your health care  provider. To begin with, repeat each exercise 5 times (repetitions). Work toward doing 3 sets of 12 repetitions or as told by your health care provider. Strengthening Exercise 1  You will need a light weight for this activity. As you grow stronger, you may use a heavier weight. 1. Standing with a weight in your hand, lift your arm straight out to the side until it is at the same height as your shoulder. 2. Bend your arm at 90 degrees so that your fingers are pointing to the ceiling. 3. Slowly raise your hand until your arm is straight up in the air. 4. Repeat steps 1-3 with the other arm. Strengthening Exercise 2  You will need a light weight for this activity. As you grow stronger, you may use a heavier weight. 1. Standing with a weight in your hand, gradually move your straight arm in an arc, starting at your side, then out in front of you, then straight up over your head. 2. Gradually move your other arm in an arc, starting at your side, then out in front of you, then straight up over your head. 3. Repeat steps 1-2 with the other arm. Strengthening Exercise 3  You will need an elastic band for this activity. As you grow stronger, gradually increase the size of the bands or increase the number of bands that you use at one time. 1. While standing, hold an elastic band in one hand and raise that arm up in the air. 2. With your other hand, pull down the band until that hand is by your side. 3. Repeat steps 1-2 with the other arm. This information is not intended to replace advice given to you by your health care provider. Make sure you discuss any questions you have with your health care provider. Document Released: 10/07/2002 Document Revised: 09/04/2015 Document Reviewed: 01/04/2014 Elsevier Interactive Patient Education  2017 [employer]. Insomnia Insomnia is a sleep disorder that makes it difficult to fall asleep or to stay asleep. Insomnia can cause tiredness (fatigue), low energy,  difficulty concentrating, mood swings, and poor performance at work or school. There are three different ways to classify insomnia:  Difficulty falling asleep.  Difficulty staying asleep.  Waking up too early in the morning. Any type of insomnia can be long-term (chronic) or short-term (acute). Both are common. Short-term insomnia usually lasts for three months or less. Chronic insomnia occurs at least three times a week for longer than three months. What are the causes? Insomnia may be caused by another condition, situation, or substance, such as:  Anxiety.  Certain medicines.  Gastroesophageal reflux disease (GERD) or other gastrointestinal conditions.  Asthma or other breathing  conditions.  Restless legs syndrome, sleep apnea, or other sleep disorders.  Chronic pain.  Menopause. This may include hot flashes.  Stroke.  Abuse of alcohol, tobacco, or illegal drugs.  Depression.  Caffeine.  Neurological disorders, such as Alzheimer disease.  An overactive thyroid (hyperthyroidism). The cause of insomnia may not be known. What increases the risk? Risk factors for insomnia include:  Gender. Women are more commonly affected than men.  Age. Insomnia is more common as you get older.  Stress. This may involve your professional or personal life.  Income. Insomnia is more common in people with lower income.  Lack of exercise.  Irregular work schedule or night shifts.  Traveling between different time zones. What are the signs or symptoms? If you have insomnia, trouble falling asleep or trouble staying asleep is the main symptom. This may lead to other symptoms, such as:  Feeling fatigued.  Feeling nervous about going to sleep.  Not feeling rested in the morning.  Having trouble concentrating.  Feeling irritable, anxious, or depressed. How is this treated? Treatment for insomnia depends on the cause. If your insomnia is caused by an underlying condition,  treatment will focus on addressing the condition. Treatment may also include:  Medicines to help you sleep.  Counseling or therapy.  Lifestyle adjustments. Follow these instructions at home:  Take medicines only as directed by your health care provider.  Keep regular sleeping and waking hours. Avoid naps.  Keep a sleep diary to help you and your health care provider figure out what could be causing your insomnia. Include:  When you sleep.  When you wake up during the night.  How well you sleep.  How rested you feel the next day.  Any side effects of medicines you are taking.  What you eat and drink.  Make your bedroom a comfortable place where it is easy to fall asleep:  Put up shades or special blackout curtains to block light from outside.  Use a white noise machine to block noise.  Keep the temperature cool.  Exercise regularly as directed by your health care provider. Avoid exercising right before bedtime.  Use relaxation techniques to manage stress. Ask your health care provider to suggest some techniques that may work well for you. These may include:  Breathing exercises.  Routines to release muscle tension.  Visualizing peaceful scenes.  Cut back on alcohol, caffeinated beverages, and cigarettes, especially close to bedtime. These can disrupt your sleep.  Do not overeat or eat spicy foods right before bedtime. This can lead to digestive discomfort that can make it hard for you to sleep.  Limit screen use before bedtime. This includes:  Watching TV.  Using your smartphone, tablet, and computer.  Stick to a routine. This can help you fall asleep faster. Try to do a quiet activity, brush your teeth, and go to bed at the same time each night.  Get out of bed if you are still awake after 15 minutes of trying to sleep. Keep the lights down, but try reading or doing a quiet activity. When you feel sleepy, go back to bed.  Make sure that you drive carefully.  Avoid driving if you feel very sleepy.  Keep all follow-up appointments as directed by your health care provider. This is important. Contact a health care provider if:  You are tired throughout the day or have trouble in your daily routine due to sleepiness.  You continue to have sleep problems or your sleep problems get worse. Get  help right away if:  You have serious thoughts about hurting yourself or someone else. This information is not intended to replace advice given to you by your health care provider. Make sure you discuss any questions you have with your health care provider. Document Released: 01/06/2000 Document Revised: 06/10/2015 Document Reviewed: 10/09/2013 Elsevier Interactive Patient Education  2017 [employer].

## 2016-06-27 ENCOUNTER — Other Ambulatory Visit (INDEPENDENT_AMBULATORY_CARE_PROVIDER_SITE_OTHER): Payer: PPO

## 2016-06-27 ENCOUNTER — Ambulatory Visit (INDEPENDENT_AMBULATORY_CARE_PROVIDER_SITE_OTHER): Payer: PPO | Admitting: Internal Medicine

## 2016-06-27 ENCOUNTER — Encounter: Payer: Self-pay | Admitting: Internal Medicine

## 2016-06-27 VITALS — BP 122/76 | HR 65 | Temp 97.5°F | Resp 16 | Ht 66.0 in | Wt 150.0 lb

## 2016-06-27 DIAGNOSIS — E038 Other specified hypothyroidism: Secondary | ICD-10-CM

## 2016-06-27 DIAGNOSIS — Z0000 Encounter for general adult medical examination without abnormal findings: Secondary | ICD-10-CM

## 2016-06-27 DIAGNOSIS — R7303 Prediabetes: Secondary | ICD-10-CM

## 2016-06-27 DIAGNOSIS — G2581 Restless legs syndrome: Secondary | ICD-10-CM | POA: Diagnosis not present

## 2016-06-27 LAB — CBC WITH DIFFERENTIAL/PLATELET
BASOS ABS: 0.1 10*3/uL (ref 0.0–0.1)
Basophils Relative: 1.4 % (ref 0.0–3.0)
Eosinophils Absolute: 0.3 10*3/uL (ref 0.0–0.7)
Eosinophils Relative: 3.8 % (ref 0.0–5.0)
HEMATOCRIT: 40.2 % (ref 36.0–46.0)
Hemoglobin: 13.6 g/dL (ref 12.0–15.0)
LYMPHS PCT: 35.5 % (ref 12.0–46.0)
Lymphs Abs: 2.6 10*3/uL (ref 0.7–4.0)
MCHC: 33.8 g/dL (ref 30.0–36.0)
MCV: 89.4 fl (ref 78.0–100.0)
Monocytes Absolute: 0.7 10*3/uL (ref 0.1–1.0)
Monocytes Relative: 10.1 % (ref 3.0–12.0)
NEUTROS ABS: 3.6 10*3/uL (ref 1.4–7.7)
NEUTROS PCT: 49.2 % (ref 43.0–77.0)
PLATELETS: 222 10*3/uL (ref 150.0–400.0)
RBC: 4.5 Mil/uL (ref 3.87–5.11)
RDW: 14.1 % (ref 11.5–15.5)
WBC: 7.3 10*3/uL (ref 4.0–10.5)

## 2016-06-27 LAB — LIPID PANEL
Cholesterol: 160 mg/dL (ref 0–200)
HDL: 54.6 mg/dL (ref >39.00)
LDL CALC: 84 mg/dL (ref 0–99)
NonHDL: 105.33
Total CHOL/HDL Ratio: 3
Triglycerides: 109 mg/dL (ref 0.0–149.0)
VLDL: 21.8 mg/dL (ref 0.0–40.0)

## 2016-06-27 LAB — COMPREHENSIVE METABOLIC PANEL
ALK PHOS: 64 U/L (ref 39–117)
ALT: 15 U/L (ref 0–35)
AST: 20 U/L (ref 0–37)
Albumin: 4.6 g/dL (ref 3.5–5.2)
BILIRUBIN TOTAL: 0.9 mg/dL (ref 0.2–1.2)
BUN: 17 mg/dL (ref 6–23)
CALCIUM: 10 mg/dL (ref 8.4–10.5)
CO2: 29 meq/L (ref 19–32)
CREATININE: 0.8 mg/dL (ref 0.40–1.20)
Chloride: 106 mEq/L (ref 96–112)
GFR: 74.29 mL/min (ref >60.00)
GLUCOSE: 102 mg/dL — AB (ref 70–99)
Potassium: 4.6 mEq/L (ref 3.5–5.1)
Sodium: 142 mEq/L (ref 135–145)
TOTAL PROTEIN: 7.3 g/dL (ref 6.0–8.3)

## 2016-06-27 LAB — TSH: TSH: 0.47 u[IU]/mL (ref 0.35–4.50)

## 2016-06-27 LAB — HEMOGLOBIN A1C: Hgb A1c MFr Bld: 6 % (ref 4.6–6.5)

## 2016-06-27 MED ORDER — MELATONIN 3 MG SL SUBL: 3.0000 mg | SUBLINGUAL_TABLET | Freq: Every day | SUBLINGUAL | Status: DC

## 2016-06-27 NOTE — Patient Instructions (Addendum)
Test(s) ordered today. Your results will be released to [place] (or called to you) after review, usually within 72hours after test completion. If any changes need to be made, you will be notified at that same time.  All other Health Maintenance issues reviewed.   All recommended immunizations and age-appropriate screenings are up-to-date or discussed.  No immunizations administered today. Consider the shingrix.   Medications reviewed and updated.  No changes recommended at this time.    Please followup in one year for a physical   Health Maintenance, Female Adopting a healthy lifestyle and getting preventive care can go a long way to promote health and wellness. Talk with your health care provider about what schedule of regular examinations is right for you. This is a good chance for you to check in with your provider about disease prevention and staying healthy. In between checkups, there are plenty of things you can do on your own. Experts have done a lot of research about which lifestyle changes and preventive measures are most likely to keep you healthy. Ask your health care provider for more information. Weight and diet Eat a healthy diet  Be sure to include plenty of vegetables, fruits, low-fat dairy products, and lean protein.  Do not eat a lot of foods high in solid fats, added sugars, or salt.  Get regular exercise. This is one of the most important things you can do for your health. ? Most adults should exercise for at least 150 minutes each week. The exercise should increase your heart rate and make you sweat (moderate-intensity exercise). ? Most adults should also do strengthening exercises at least twice a week. This is in addition to the moderate-intensity exercise.  Maintain a healthy weight  Body mass index (BMI) is a measurement that can be used to identify possible weight problems. It estimates body fat based on height and weight. Your health care provider can help  determine your BMI and help you achieve or maintain a healthy weight.  For females 69 years of age and older: ? A BMI below 18.5 is considered underweight. ? A BMI of 18.5 to 24.9 is normal. ? A BMI of 25 to 29.9 is considered overweight. ? A BMI of 30 and above is considered obese.  Watch levels of cholesterol and blood lipids  You should start having your blood tested for lipids and cholesterol at 75 years of age, then have this test every 5 years.  You may need to have your cholesterol levels checked more often if: ? Your lipid or cholesterol levels are high. ? You are older than 75 years of age. ? You are at high risk for heart disease.  Cancer screening Lung Cancer  Lung cancer screening is recommended for adults 41-71 years old who are at high risk for lung cancer because of a history of smoking.  A yearly low-dose CT scan of the lungs is recommended for people who: ? Currently smoke. ? Have quit within the past 15 years. ? Have at least a 30-pack-year history of smoking. A pack year is smoking an average of one pack of cigarettes a day for 1 year.  Yearly screening should continue until it has been 15 years since you quit.  Yearly screening should stop if you develop a health problem that would prevent you from having lung cancer treatment.  Breast Cancer  Practice breast self-awareness. This means understanding how your breasts normally appear and feel.  It also means doing regular breast self-exams. Let your  health care provider know about any changes, no matter how small.  If you are in your 20s or 30s, you should have a clinical breast exam (CBE) by a health care provider every 1-3 years as part of a regular health exam.  If you are 42 or older, have a CBE every year. Also consider having a breast X-ray (mammogram) every year.  If you have a family history of breast cancer, talk to your health care provider about genetic screening.  If you are at high risk for  breast cancer, talk to your health care provider about having an MRI and a mammogram every year.  Breast cancer gene (BRCA) assessment is recommended for women who have family members with BRCA-related cancers. BRCA-related cancers include: ? Breast. ? Ovarian. ? Tubal. ? Peritoneal cancers.  Results of the assessment will determine the need for genetic counseling and BRCA1 and BRCA2 testing.  Cervical Cancer Your health care provider may recommend that you be screened regularly for cancer of the pelvic organs (ovaries, uterus, and vagina). This screening involves a pelvic examination, including checking for microscopic changes to the surface of your cervix (Pap test). You may be encouraged to have this screening done every 3 years, beginning at age 23.  For women ages 54-65, health care providers may recommend pelvic exams and Pap testing every 3 years, or they may recommend the Pap and pelvic exam, combined with testing for human papilloma virus (HPV), every 5 years. Some types of HPV increase your risk of cervical cancer. Testing for HPV may also be done on women of any age with unclear Pap test results.  Other health care providers may not recommend any screening for nonpregnant women who are considered low risk for pelvic cancer and who do not have symptoms. Ask your health care provider if a screening pelvic exam is right for you.  If you have had past treatment for cervical cancer or a condition that could lead to cancer, you need Pap tests and screening for cancer for at least 20 years after your treatment. If Pap tests have been discontinued, your risk factors (such as having a new sexual partner) need to be reassessed to determine if screening should resume. Some women have medical problems that increase the chance of getting cervical cancer. In these cases, your health care provider may recommend more frequent screening and Pap tests.  Colorectal Cancer  This type of cancer can be  detected and often prevented.  Routine colorectal cancer screening usually begins at 75 years of age and continues through 75 years of age.  Your health care provider may recommend screening at an earlier age if you have risk factors for colon cancer.  Your health care provider may also recommend using home test kits to check for hidden blood in the stool.  A small camera at the end of a tube can be used to examine your colon directly (sigmoidoscopy or colonoscopy). This is done to check for the earliest forms of colorectal cancer.  Routine screening usually begins at age 33.  Direct examination of the colon should be repeated every 5-10 years through 75 years of age. However, you may need to be screened more often if early forms of precancerous polyps or small growths are found.  Skin Cancer  Check your skin from head to toe regularly.  Tell your health care provider about any new moles or changes in moles, especially if there is a change in a mole's shape or color.  Also  tell your health care provider if you have a mole that is larger than the size of a pencil eraser.  Always use sunscreen. Apply sunscreen liberally and repeatedly throughout the day.  Protect yourself by wearing long sleeves, pants, a wide-brimmed hat, and sunglasses whenever you are outside.  Heart disease, diabetes, and high blood pressure  High blood pressure causes heart disease and increases the risk of stroke. High blood pressure is more likely to develop in: ? People who have blood pressure in the high end of the normal range (130-139/85-89 mm Hg). ? People who are overweight or obese. ? People who are African American.  If you are 43-71 years of age, have your blood pressure checked every 3-5 years. If you are 78 years of age or older, have your blood pressure checked every year. You should have your blood pressure measured twice-once when you are at a hospital or clinic, and once when you are not at a  hospital or clinic. Record the average of the two measurements. To check your blood pressure when you are not at a hospital or clinic, you can use: ? An automated blood pressure machine at a pharmacy. ? A home blood pressure monitor.  If you are between 72 years and 57 years old, ask your health care provider if you should take aspirin to prevent strokes.  Have regular diabetes screenings. This involves taking a blood sample to check your fasting blood sugar level. ? If you are at a normal weight and have a low risk for diabetes, have this test once every three years after 75 years of age. ? If you are overweight and have a high risk for diabetes, consider being tested at a younger age or more often. Preventing infection Hepatitis B  If you have a higher risk for hepatitis B, you should be screened for this virus. You are considered at high risk for hepatitis B if: ? You were born in a country where hepatitis B is common. Ask your health care provider which countries are considered high risk. ? Your parents were born in a high-risk country, and you have not been immunized against hepatitis B (hepatitis B vaccine). ? You have HIV or AIDS. ? You use needles to inject street drugs. ? You live with someone who has hepatitis B. ? You have had sex with someone who has hepatitis B. ? You get hemodialysis treatment. ? You take certain medicines for conditions, including cancer, organ transplantation, and autoimmune conditions.  Hepatitis C  Blood testing is recommended for: ? Everyone born from [birth year] through 1964. ? Anyone with known risk factors for hepatitis C.  Sexually transmitted infections (STIs)  You should be screened for sexually transmitted infections (STIs) including gonorrhea and chlamydia if: ? You are sexually active and are younger than 75 years of age. ? You are older than 75 years of age and your health care provider tells you that you are at risk for this type of  infection. ? Your sexual activity has changed since you were last screened and you are at an increased risk for chlamydia or gonorrhea. Ask your health care provider if you are at risk.  If you do not have HIV, but are at risk, it may be recommended that you take a prescription medicine daily to prevent HIV infection. This is called pre-exposure prophylaxis (PrEP). You are considered at risk if: ? You are sexually active and do not regularly use condoms or know the HIV status of your  partner(s). ? You take drugs by injection. ? You are sexually active with a partner who has HIV.  Talk with your health care provider about whether you are at high risk of being infected with HIV. If you choose to begin PrEP, you should first be tested for HIV. You should then be tested every 3 months for as long as you are taking PrEP. Pregnancy  If you are premenopausal and you may become pregnant, ask your health care provider about preconception counseling.  If you may become pregnant, take 400 to 800 micrograms (mcg) of folic acid every day.  If you want to prevent pregnancy, talk to your health care provider about birth control (contraception). Osteoporosis and menopause  Osteoporosis is a disease in which the bones lose minerals and strength with aging. This can result in serious bone fractures. Your risk for osteoporosis can be identified using a bone density scan.  If you are 34 years of age or older, or if you are at risk for osteoporosis and fractures, ask your health care provider if you should be screened.  Ask your health care provider whether you should take a calcium or vitamin D supplement to lower your risk for osteoporosis.  Menopause may have certain physical symptoms and risks.  Hormone replacement therapy may reduce some of these symptoms and risks. Talk to your health care provider about whether hormone replacement therapy is right for you. Follow these instructions at home:  Schedule  regular health, dental, and eye exams.  Stay current with your immunizations.  Do not use any tobacco products including cigarettes, chewing tobacco, or electronic cigarettes.  If you are pregnant, do not drink alcohol.  If you are breastfeeding, limit how much and how often you drink alcohol.  Limit alcohol intake to no more than 1 drink per day for nonpregnant women. One drink equals 12 ounces of beer, 5 ounces of wine, or 1 ounces of hard liquor.  Do not use street drugs.  Do not share needles.  Ask your health care provider for help if you need support or information about quitting drugs.  Tell your health care provider if you often feel depressed.  Tell your health care provider if you have ever been abused or do not feel safe at home. This information is not intended to replace advice given to you by your health care provider. Make sure you discuss any questions you have with your health care provider. Document Released: 07/24/2010 Document Revised: 06/16/2015 Document Reviewed: 10/12/2014 Elsevier Interactive Patient Education  [employer].

## 2016-06-27 NOTE — Assessment & Plan Note (Signed)
She takes requip nightly and her symptoms are controlled

## 2016-06-27 NOTE — Progress Notes (Signed)
Subjective:    Patient ID: Elizabeth Espinoza, female    DOB: [birth year], 76 y.o.   MRN: [id number]  HPI  She is here for a physical exam.   Left buttock pain, SI pain:  She has arthritis.  She has done massages and that has helped.   She started taking melatonin at night.  It is helping.    Medications and allergies reviewed with patient and updated if appropriate.  Patient Active Problem List   Diagnosis Date Noted  . Herpes zoster without complication 48/61/6122  . Hair loss 06/13/2015  . Prediabetes 02/24/2015  . Dyspnea 09/29/2014  . Abnormal chest x-ray 12/11/2012  . Barrett's esophagus 12/11/2012  . Anemia 12/09/2012  . RLS (restless legs syndrome) 04/13/2011  . 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 UNSPECIFIED MENOPAUSAL&POSTMENOPAUSAL DISORDER 09/01/2008  . ARTHRALGIA 09/01/2008  . HIATAL HERNIA 07/31/2007  . MITRAL VALVE PROLAPSE, HX OF 07/31/2007  . ALLERGIC RHINITIS WITH CONJUNCTIVITIS 06/19/2007  . GASTROESOPHAGEAL REFLUX DISEASE, CHRONIC 06/19/2007  . Hypothyroidism 02/07/2007  . HYPERLIPIDEMIA 02/07/2007    Current Outpatient Prescriptions on File Prior to Visit  Medication Sig Dispense Refill  . Cholecalciferol (VITAMIN D-3 PO) Take by mouth daily.    Marland Kitchen Fexofenadine HCl (ALLEGRA PO) Take by mouth.    . levothyroxine (SYNTHROID, LEVOTHROID) 100 MCG tablet Take 1 tablet daily for 6 days of the week, no medication one day of the week 78 tablet 0  . MAGNESIUM CARBONATE PO Take by mouth.    . pravastatin (PRAVACHOL) 40 MG tablet Take 1 tablet (40 mg total) by mouth daily. 90 tablet 0  . Probiotic Product (PROBIOTIC DAILY PO) Take 1 capsule by mouth daily. ULTRA FLORA    . rOPINIRole (REQUIP) 0.5 MG tablet TAKE 2 TABLETS BY MOUTH  AFTER DINNER EACH NIGHT. 180 tablet 1  . [DISCONTINUED] loratadine (CLARITIN) 10 MG tablet Take 10 mg by mouth daily as needed.       No current facility-administered medications on file prior to visit.      Past Medical History:  Diagnosis Date  . Allergic rhinitis   . GERD (gastroesophageal reflux disease)   . Glaucoma    SUSPECT  . Hiatal hernia   . Hyperlipidemia    LDL goal = < 100  . Hypothyroidism   . MVP (mitral valve prolapse)    documented on 2 D ECHO    Past Surgical History:  Procedure Laterality Date  . APPENDECTOMY     with TAH  . CARDIAC CATHETERIZATION  1989   negative  . COLONOSCOPY  2005   Dr Olevia Perches  . COLONOSCOPY  01/2013   Dr Collene Mares  . esophageal dilation  07/04/2011   Dr Olevia Perches  . FLEXIBLE SIGMOIDOSCOPY  1999    Dr Olevia Perches  . KNEE ARTHROSCOPY  2001, 2005   Dr Percell Miller   bilat  . TOTAL ABDOMINAL HYSTERECTOMY     for Endometriosis; no BSO, Dr Colin Ina  . UPPER GI ENDOSCOPY  01/2013   Dr Collene Mares    Social History   Social History  . Marital status: Married    Spouse name: Jeneen Rinks  . Number of children: Y  . Years of education: N/A   Occupational History  . retired from [employer]  . Smoking status: Never Smoker  . Smokeless tobacco: Never Used  . Alcohol use Yes     Comment:  rarely  . Drug use: No  .  Sexual activity: Yes   Other Topics Concern  . Not on file   Social History Narrative  . No narrative on file    Family History  Problem Relation Age of Onset  . Lung cancer Father        Asbestos with mesothelioma  . Heart disease Paternal Grandmother        ? etiology  . Diabetes Paternal Grandmother   . Diabetes Brother   . Uterine cancer Paternal Aunt   . Stroke Paternal Grandfather        > 63  . Heart attack Brother 66  . Allergies Sister   . Aneurysm Mother        congenital  . Stroke Brother        two strokes  . Barrett's esophagus Brother     Review of Systems  Constitutional: Negative for chills, fatigue and fever.  HENT: Positive for postnasal drip.   Eyes: Negative for photophobia and visual disturbance.  Respiratory: Negative for cough, shortness of breath and wheezing.     Cardiovascular: Negative for chest pain, palpitations and leg swelling.  Gastrointestinal: Negative for abdominal pain, blood in stool, constipation, diarrhea and nausea.       GERD occ  Genitourinary: Negative for dysuria and hematuria.  Musculoskeletal: Positive for arthralgias.  Skin: Negative for color change and rash.  Neurological: Positive for light-headedness (mild, occ) and headaches (mild, occ).  Psychiatric/Behavioral: Positive for sleep disturbance (improved with melatonin). Negative for dysphoric mood. The patient is not nervous/anxious.        Objective:   Vitals:   06/27/16 0909  BP: 122/76  Pulse: 65  Resp: 16  Temp: 97.5 F (36.4 C)   Wt Readings from Last 3 Encounters:  06/27/16 150 lb (68 kg)  06/14/16 151 lb (68.5 kg)  12/30/15 150 lb (68 kg)   Body mass index is 24.21 kg/m.   Physical Exam    Constitutional: She appears well-developed and well-nourished. No distress.  HENT:  Head: Normocephalic and atraumatic.  Right Ear: External ear normal. Normal ear canal and TM Left Ear: External ear normal.  Normal ear canal and TM Mouth/Throat: Oropharynx is clear and moist.  Eyes: Conjunctivae and EOM are normal.  Neck: Neck supple. No tracheal deviation present. No thyromegaly present.  No carotid bruit  Cardiovascular: Normal rate, regular rhythm and normal heart sounds.   No murmur heard.  No edema. Pulmonary/Chest: Effort normal and breath sounds normal. No respiratory distress. She has no wheezes. She has no rales.  Breast: deferred to Gyn Abdominal: Soft. She exhibits no distension. There is no tenderness.  Lymphadenopathy: She has no cervical adenopathy.  Skin: Skin is warm and dry. She is not diaphoretic.  Psychiatric: She has a normal mood and affect. Her behavior is normal.       Assessment & Plan:    Physical exam: Screening blood work Immunizations  Up to date,  Discussed shingrix  Colonoscopy    Up to date  Mammogram  Up to date   - will have tomorrow Gyn - no longer seeing Dexa - due - will ordered Eye exams  Up to date  EKG  Last done 2016 [facility] - [facility] - [facility]- trying to lose a few pounds, normal BMI Skin   No concerns Substance abuse   none  See Problem List for Assessment and Plan of chronic medical problems.

## 2016-06-27 NOTE — Assessment & Plan Note (Signed)
Check a1c Low sugar / carb diet Stressed regular exercise, keeping weight down

## 2016-06-27 NOTE — Assessment & Plan Note (Signed)
Check tsh  Titrate med dose if needed

## 2016-06-27 NOTE — Addendum Note (Signed)
Addended by: Binnie Rail on: 06/27/2016 09:59 AM   Modules accepted: Level of Service

## 2016-06-28 ENCOUNTER — Encounter: Payer: Self-pay | Admitting: Internal Medicine

## 2016-06-28 ENCOUNTER — Ambulatory Visit
Admission: RE | Admit: 2016-06-28 | Discharge: 2016-06-28 | Disposition: A | Discharge status: Home / Self Care | Payer: PPO | Source: Ambulatory Visit | Attending: Internal Medicine | Admitting: Internal Medicine

## 2016-06-28 ENCOUNTER — Other Ambulatory Visit: Payer: Self-pay | Admitting: Internal Medicine

## 2016-06-28 DIAGNOSIS — N63 Unspecified lump in unspecified breast: Secondary | ICD-10-CM

## 2016-06-28 DIAGNOSIS — Z1231 Encounter for screening mammogram for malignant neoplasm of breast: Secondary | ICD-10-CM

## 2016-07-02 ENCOUNTER — Ambulatory Visit
Admission: RE | Admit: 2016-07-02 | Discharge: 2016-07-02 | Disposition: A | Discharge status: Home / Self Care | Payer: PPO | Source: Ambulatory Visit | Attending: Internal Medicine | Admitting: Internal Medicine

## 2016-07-02 DIAGNOSIS — N63 Unspecified lump in unspecified breast: Secondary | ICD-10-CM

## 2016-07-02 DIAGNOSIS — R928 Other abnormal and inconclusive findings on diagnostic imaging of breast: Secondary | ICD-10-CM | POA: Diagnosis not present

## 2016-07-02 DIAGNOSIS — N6489 Other specified disorders of breast: Secondary | ICD-10-CM | POA: Diagnosis not present

## 2016-07-30 ENCOUNTER — Encounter: Payer: Self-pay | Admitting: Internal Medicine

## 2016-07-30 ENCOUNTER — Other Ambulatory Visit: Payer: Self-pay | Admitting: Internal Medicine

## 2016-07-30 ENCOUNTER — Ambulatory Visit (INDEPENDENT_AMBULATORY_CARE_PROVIDER_SITE_OTHER): Payer: PPO | Admitting: Internal Medicine

## 2016-07-30 VITALS — BP 132/78 | HR 64 | Temp 97.9°F | Resp 16 | Wt 152.0 lb

## 2016-07-30 DIAGNOSIS — R21 Rash and other nonspecific skin eruption: Secondary | ICD-10-CM | POA: Diagnosis not present

## 2016-07-30 MED ORDER — PREDNISONE 10 MG PO TABS: ORAL_TABLET | ORAL | 0 refills | Status: DC

## 2016-07-30 NOTE — Assessment & Plan Note (Addendum)
Rash  - appears almost like small hives Likely allergic Arms and legs are covered, no rash elsewhere Continue allegra, benadryl prn Prednisone taper  Call if no improvement

## 2016-07-30 NOTE — Patient Instructions (Signed)
Take the steroid taper as prescribed.  Call if no improvement

## 2016-07-30 NOTE — Progress Notes (Signed)
Subjective:    Patient ID: Elizabeth Espinoza, female    DOB: 03/28/1940, 76 y.o.   MRN: [id number]  HPI She is here for an acute visit.   Rash:  The rash started on her right arm, then left arm and then spread to her legs.  It started about one week ago.  It does not itch, but feels like something is crawling on her skin.  Her ankles then broke out and then burn.   She was in [place] when it started - she was in the house most of time.    She has a mild scratchy throat.  She denies difficulty swallowing or difficulty breathing. She denies any fevers, chills, cough, wheeze or shortness breath.  She took benadryl and she does not think it helped.    She denies changes in medications, supplements, products.  The only change in food was increased peaches, but she has eaten them in the past.    Medications and allergies reviewed with patient and updated if appropriate.  Patient Active Problem List   Diagnosis Date Noted  . Herpes zoster without complication 43/14/2767  . Prediabetes 02/24/2015  . Abnormal chest x-ray 12/11/2012  . Barrett's esophagus 12/11/2012  . Anemia 12/09/2012  . RLS (restless legs syndrome) 04/13/2011  . 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 ARTHRALGIA 09/01/2008  . HIATAL HERNIA 07/31/2007  . MITRAL VALVE PROLAPSE, HX OF 07/31/2007  . ALLERGIC RHINITIS WITH CONJUNCTIVITIS 06/19/2007  . GASTROESOPHAGEAL REFLUX DISEASE, CHRONIC 06/19/2007  . Hypothyroidism 02/07/2007  . HYPERLIPIDEMIA 02/07/2007    Current Outpatient Prescriptions on File Prior to Visit  Medication Sig Dispense Refill  . Cholecalciferol (VITAMIN D-3 PO) Take by mouth daily.    Marland Kitchen Fexofenadine HCl (ALLEGRA PO) Take by mouth.    . levothyroxine (SYNTHROID, LEVOTHROID) 100 MCG tablet TAKE 1 TABLET DAILY FOR 6 DAYS OF THE WEEK, NO MEDICATION ONE DAY OF THE WEEK 78 tablet 1  . MAGNESIUM CARBONATE PO Take by mouth.    . Melatonin 3 MG SUBL Place 3 mg under the  tongue at bedtime.    . pravastatin (PRAVACHOL) 40 MG tablet TAKE 1 TABLET BY MOUTH EVERY DAY 90 tablet 1  . Probiotic Product (PROBIOTIC DAILY PO) Take 1 capsule by mouth daily. ULTRA FLORA    . rOPINIRole (REQUIP) 0.5 MG tablet TAKE 2 TABLETS BY MOUTH  AFTER DINNER EACH NIGHT. 180 tablet 1  . [DISCONTINUED] loratadine (CLARITIN) 10 MG tablet Take 10 mg by mouth daily as needed.       No current facility-administered medications on file prior to visit.     Past Medical History:  Diagnosis Date  . Allergic rhinitis   . GERD (gastroesophageal reflux disease)   . Glaucoma    SUSPECT  . Hiatal hernia   . Hyperlipidemia    LDL goal = < 100  . Hypothyroidism   . MVP (mitral valve prolapse)    documented on 2 D ECHO    Past Surgical History:  Procedure Laterality Date  . APPENDECTOMY     with TAH  . CARDIAC CATHETERIZATION  1989   negative  . COLONOSCOPY  2005   Dr Olevia Perches  . COLONOSCOPY  01/2013   Dr Collene Mares  . esophageal dilation  07/04/2011   Dr Olevia Perches  . FLEXIBLE SIGMOIDOSCOPY  1999    Dr Olevia Perches  . KNEE ARTHROSCOPY  2001, 2005   Dr Percell Miller   bilat  . TOTAL ABDOMINAL HYSTERECTOMY  for Endometriosis; no BSO, Dr Colin Ina  . UPPER GI ENDOSCOPY  01/2013   Dr Collene Mares    Social History   Social History  . Marital status: Married    Spouse name: Jeneen Rinks  . Number of children: Y  . Years of education: N/A   Occupational History  . retired from [employer]  . Smoking status: Never Smoker  . Smokeless tobacco: Never Used  . Alcohol use Yes     Comment:  rarely  . Drug use: No  . Sexual activity: Yes   Other Topics Concern  . Not on file   Social History Narrative  . No narrative on file    Family History  Problem Relation Age of Onset  . Lung cancer Father        Asbestos with mesothelioma  . Heart disease Paternal Grandmother        ? etiology  . Diabetes Paternal Grandmother   . Diabetes Brother   . Uterine cancer Paternal  Aunt   . Stroke Paternal Grandfather        > 54  . Heart attack Brother 42  . Allergies Sister   . Aneurysm Mother        congenital  . Stroke Brother        two strokes  . Barrett's esophagus Brother     Review of Systems  Constitutional: Negative for chills and fever.  HENT: Negative for congestion, sinus pressure and sore throat (scratchy throat).   Respiratory: Negative for cough, shortness of breath and wheezing.   Gastrointestinal: Positive for diarrhea (had it for 4 days last week). Negative for abdominal pain, constipation, nausea and vomiting.  Musculoskeletal: Negative for myalgias.  Neurological: Negative for light-headedness and headaches.       Objective:   Vitals:   07/30/16 1448  BP: 132/78  Pulse: 64  Resp: 16  Temp: 97.9 F (36.6 C)   Filed Weights   07/30/16 1448  Weight: 152 lb (68.9 kg)   Body mass index is 24.53 kg/m.  Wt Readings from Last 3 Encounters:  07/30/16 152 lb (68.9 kg)  06/27/16 150 lb (68 kg)  06/14/16 151 lb (68.5 kg)     Physical Exam  Constitutional: She appears well-developed and well-nourished. No distress.  HENT:  Head: Normocephalic and atraumatic.  Mouth/Throat: Oropharynx is clear and moist.  No oropharyngeal swelling, no neck swelling  Eyes: Conjunctivae are normal.  Pulmonary/Chest: Effort normal.  Musculoskeletal: She exhibits no edema.  Skin: Skin is warm and dry. Rash (Raised macular papular rash bilateral arms, legs and ankles) noted.          Assessment & Plan:   See Problem List for Assessment and Plan of chronic medical problems.

## 2016-08-27 ENCOUNTER — Encounter: Payer: Self-pay | Admitting: Internal Medicine

## 2016-08-28 ENCOUNTER — Other Ambulatory Visit: Payer: Self-pay | Admitting: Emergency Medicine

## 2016-08-28 MED ORDER — ROPINIROLE HCL 0.5 MG PO TABS: ORAL_TABLET | ORAL | 1 refills | Status: DC

## 2016-08-28 NOTE — Telephone Encounter (Signed)
Patient is following up on refill request. She was informed it can take up to 48 to 72 hours.

## 2016-09-06 DIAGNOSIS — R1033 Periumbilical pain: Secondary | ICD-10-CM | POA: Diagnosis not present

## 2016-09-06 DIAGNOSIS — R194 Change in bowel habit: Secondary | ICD-10-CM | POA: Diagnosis not present

## 2016-09-06 DIAGNOSIS — K219 Gastro-esophageal reflux disease without esophagitis: Secondary | ICD-10-CM | POA: Diagnosis not present

## 2016-09-06 DIAGNOSIS — R140 Abdominal distension (gaseous): Secondary | ICD-10-CM | POA: Diagnosis not present

## 2016-09-07 ENCOUNTER — Encounter: Payer: Self-pay | Admitting: Internal Medicine

## 2016-09-07 DIAGNOSIS — L659 Nonscarring hair loss, unspecified: Secondary | ICD-10-CM

## 2016-09-07 DIAGNOSIS — E039 Hypothyroidism, unspecified: Secondary | ICD-10-CM

## 2016-09-16 ENCOUNTER — Encounter: Payer: Self-pay | Admitting: Internal Medicine

## 2016-09-18 MED ORDER — LEVOTHYROXINE SODIUM 88 MCG PO TABS: ORAL_TABLET | ORAL | 3 refills | Status: DC

## 2016-10-23 ENCOUNTER — Encounter: Payer: Self-pay | Admitting: Internal Medicine

## 2016-10-23 ENCOUNTER — Ambulatory Visit (INDEPENDENT_AMBULATORY_CARE_PROVIDER_SITE_OTHER): Payer: PPO | Admitting: Internal Medicine

## 2016-10-23 VITALS — BP 124/74 | HR 70 | Ht 66.0 in | Wt 157.0 lb

## 2016-10-23 DIAGNOSIS — E039 Hypothyroidism, unspecified: Secondary | ICD-10-CM | POA: Diagnosis not present

## 2016-10-23 NOTE — Patient Instructions (Addendum)
Please continue Levothyroxine 88 mcg 6/7 days.  Take the thyroid hormone every day, with water, at least 30 minutes before breakfast, separated by at least 4 hours from: - acid reflux medications - calcium - iron - multivitamins  Please come back for labs in 3 days, off the Biotin.  Try to use Minoxidil (Qilib) spray.  Please return in 6 months.

## 2016-10-23 NOTE — Progress Notes (Addendum)
Patient ID: [name], female   DOB: 20-Feb-1940, 76 y.o.   MRN: [id number]    HPI  Elizabeth Espinoza is a 76 y.o.-year-old female, referred by her PCP, Dr. Quay Burow for management of hypothyroidism. She also has hair loss and wonders if this is related to her thyroid condition.  Pt. has been dx with hypothyroidism in 1994 >> on Synthroid initially >> now after changed to [facility] >> Levothyroxine 88 mcg 6/7 days (decreased dose 08/2016).  She takes the thyroid hormone: - fasting, at 2-3 am (has an early dinner), latest snack ~ 7 am - with water - separated by >30 min from b'fast  - no calcium, iron, multivitamins  - + Nexium in am at 7 am - + Hair Skin an Nails at night - + magnesium at night  I reviewed pt's thyroid tests: Lab Results  Component Value Date   TSH 0.47 06/27/2016   TSH 1.40 08/11/2015   TSH 0.14 (L) 06/13/2015   TSH 0.14 (L) 05/16/2015   TSH 8.73 (H) 02/23/2015   TSH 3.45 06/11/2014   TSH 0.82 06/09/2013   TSH 2.49 11/23/2011   TSH 4.94 05/31/2011   TSH 0.21 (L) 03/06/2011   FREET4 1.30 06/13/2015   FREET4 1.58 05/16/2015   FREET4 0.90 02/23/2015   FREET4 1.16 01/21/2007    Pt describes: - + weight gain (was on Prednisone for hives  - July 2018) - + fatigue - no cold intolerance - no depression - no constipation - + dry skin - + hair loss  She started a Hair Skin and Nail vitamin >> 1 year ago >> no changes in hair loss.  Pt denies feeling nodules in neck, dysphagia/odynophagia, SOB with lying down. + hoarseness  She has + FH of thyroid disorders in: P aunt. No FH of thyroid cancer.  No h/o radiation tx to head or neck. No recent use of iodine supplements.  Pt. also has a history of GERD, prediabetes, MVP, hiatal hernial, RLS.  ROS: Constitutional: + see HPi, + hot flushes, + poor sleep Eyes: + blurry vision, no xerophthalmia ENT: no sore throat, + see HPI Cardiovascular: no CP/+ SOB/palpitations/leg swelling Respiratory: no cough/+  SOB Gastrointestinal: no N/V/+D/no C/+ acid reflux Musculoskeletal: no muscle/joint aches Skin: no rashes, + hair loss Neurological: no tremors/numbness/tingling/dizziness, + HA Psychiatric: no depression/anxiety + low libido  Past Medical History:  Diagnosis Date  . Allergic rhinitis   . GERD (gastroesophageal reflux disease)   . Glaucoma    SUSPECT  . Hiatal hernia   . Hyperlipidemia    LDL goal = < 100  . Hypothyroidism   . MVP (mitral valve prolapse)    documented on 2 D ECHO   Past Surgical History:  Procedure Laterality Date  . APPENDECTOMY     with TAH  . CARDIAC CATHETERIZATION  1989   negative  . COLONOSCOPY  2005   Dr Olevia Perches  . COLONOSCOPY  01/2013   Dr Collene Mares  . esophageal dilation  07/04/2011   Dr Olevia Perches  . FLEXIBLE SIGMOIDOSCOPY  1999    Dr Olevia Perches  . KNEE ARTHROSCOPY  2001, 2005   Dr Percell Miller   bilat  . TOTAL ABDOMINAL HYSTERECTOMY     for Endometriosis; no BSO, Dr Colin Ina  . UPPER GI ENDOSCOPY  01/2013   Dr Collene Mares   Social History   Social History  . Marital status: Married    Spouse name: Jeneen Rinks  . Number of children: 2   Occupational History  .  retired from [employer]  . Smoking status: Never Smoker  . Smokeless tobacco: Never Used  . Alcohol use Yes     Comment:  rarely  . Drug use: No   Current Outpatient Prescriptions on File Prior to Visit  Medication Sig Dispense Refill  . Fexofenadine HCl (ALLEGRA PO) Take by mouth.    . levothyroxine (SYNTHROID, LEVOTHROID) 88 MCG tablet Taking one tablet 6 days a week 90 tablet 3  . MAGNESIUM CARBONATE PO Take by mouth.    . Melatonin 3 MG SUBL Place 3 mg under the tongue at bedtime.    . pravastatin (PRAVACHOL) 40 MG tablet TAKE 1 TABLET BY MOUTH EVERY DAY 90 tablet 1  . Probiotic Product (PROBIOTIC DAILY PO) Take 1 capsule by mouth daily. ULTRA FLORA    . rOPINIRole (REQUIP) 0.5 MG tablet TAKE 2 TABLETS BY MOUTH  AFTER DINNER EACH NIGHT. 180 tablet 1  .  Cholecalciferol (VITAMIN D-3 PO) Take by mouth daily.    . [DISCONTINUED] loratadine (CLARITIN) 10 MG tablet Take 10 mg by mouth daily as needed.       No current facility-administered medications on file prior to visit.    Allergies  Allergen Reactions  . Caffeine     palpitations  . Chlorpheniramine-Pseudoeph     palpitations  . Maxifed     palpitations   Family History  Problem Relation Age of Onset  . Lung cancer Father        Asbestos with mesothelioma  . Heart disease Paternal Grandmother        ? etiology  . Diabetes Paternal Grandmother   . Diabetes Brother   . Uterine cancer Paternal Aunt   . Stroke Paternal Grandfather        > 51  . Heart attack Brother 40  . Allergies Sister   . Aneurysm Mother        congenital  . Stroke Brother        two strokes  . Barrett's esophagus Brother     PE: BP 124/74 (BP Location: Left Arm, Patient Position: Sitting)   Pulse 70   Ht _0  (1.676 m)   Wt 157 lb (71.2 kg)   SpO2 94%   BMI 25.34 kg/m  Wt Readings from Last 3 Encounters:  10/23/16 157 lb (71.2 kg)  07/30/16 152 lb (68.9 kg)  06/27/16 150 lb (68 kg)   Constitutional: normal weight, in NAD Eyes: PERRLA, EOMI, no exophthalmos ENT: moist mucous membranes, no thyromegaly, no cervical lymphadenopathy Cardiovascular: RRR, No MRG Respiratory: CTA B Gastrointestinal: abdomen soft, NT, ND, BS+ Musculoskeletal: no deformities, strength intact in all 4 Skin: moist, warm, no rashes, + diffuse mild alopecia + thinning hair Neurological: no tremor with outstretched hands, DTR normal in all 4  ASSESSMENT: 1. Hypothyroidism  2. Hair loss  PLAN:  1. Patient with long-standing hypothyroidism, on levothyroxine therapy (Levothyroxine 88 mcg 6/7 days) - she appears euthyroid, but has several complaints that could be 2/2 hypothyroidism: fatigue, weight gain, hair loss, dry skin - she does not appear to have a goiter, thyroid nodules, or neck compression symptoms - We  discussed about correct intake of levothyroxine, fasting, with water, separated by at least 30 minutes from breakfast, and separated by more than 4 hours from calcium, iron, multivitamins, acid reflux medications (PPIs). She is taking it correctly, in the middle of the night. - will check thyroid tests in few days as she needs to  be off Biotin for at least 3 days: TSH, free T4, TPO and ATA antibodies - If labs are abnormal, she will need to return in ~6 weeks for repeat labs - Otherwise, I will see her back in 6 months  2. Hair loss We discussed about possible causes of alopecia:  Hypothyroidism - will check TFTs >> will need to stabilize her TSH levels  Menopause   Poor diet  Stress   Vitamin deficiencies - she is on B12 vitamin, Biotin and B6 vitamin supplements; is not taking excess vitamin A   Micronutrient deficiencies - no clear iron deficiency (Hb normal);  zinc since deficiency was also associated with hair loss  Protein deficiency - especially L-lysine aminoacid deficiency >> hair loss  Medications not on meds that can cause this - Advised to try to use Qilib  Orders Placed This Encounter  Procedures  . TSH  . T4, free  . Thyroid peroxidase antibody  . Thyroglobulin antibody   Component     Latest Ref Rng & Units 10/26/2016  TSH     0.35 - 4.50 uIU/mL 3.89  T4,Free(Direct)     0.60 - 1.60 ng/dL 0.93  Thyroperoxidase Ab SerPl-aCnc     <9 IU/mL 1  Thyroglobulin Ab     < or = 1 IU/mL 1   No sign of autoimmune thyroiditis. Labs are normal.  Philemon Kingdom, MD PhD [facility] Endocrin[facility]

## 2016-10-26 ENCOUNTER — Other Ambulatory Visit (INDEPENDENT_AMBULATORY_CARE_PROVIDER_SITE_OTHER): Payer: PPO

## 2016-10-26 DIAGNOSIS — E039 Hypothyroidism, unspecified: Secondary | ICD-10-CM | POA: Diagnosis not present

## 2016-10-26 LAB — T4, FREE: Free T4: 0.93 ng/dL (ref 0.60–1.60)

## 2016-10-26 LAB — TSH: TSH: 3.89 u[IU]/mL (ref 0.35–4.50)

## 2016-10-29 LAB — THYROGLOBULIN ANTIBODY: Thyroglobulin Ab: 1 IU/mL (ref <=1)

## 2016-10-29 LAB — THYROID PEROXIDASE ANTIBODY: Thyroperoxidase Ab SerPl-aCnc: 1 IU/mL (ref <9)

## 2016-10-30 ENCOUNTER — Encounter: Payer: Self-pay | Admitting: Internal Medicine

## 2016-10-31 ENCOUNTER — Telehealth: Payer: Self-pay | Admitting: Internal Medicine

## 2016-10-31 DIAGNOSIS — E2839 Other primary ovarian failure: Secondary | ICD-10-CM

## 2016-10-31 DIAGNOSIS — Z13820 Encounter for screening for osteoporosis: Secondary | ICD-10-CM

## 2016-10-31 NOTE — Telephone Encounter (Signed)
Pt requested an appointment for a Dexa Scan, can order be put in for this?  Please advise

## 2016-10-31 NOTE — Telephone Encounter (Signed)
Ordered.  Please schedule

## 2016-10-31 NOTE — Telephone Encounter (Signed)
Appt scheduled

## 2016-11-05 ENCOUNTER — Ambulatory Visit (INDEPENDENT_AMBULATORY_CARE_PROVIDER_SITE_OTHER)
Admission: RE | Admit: 2016-11-05 | Discharge: 2016-11-05 | Disposition: A | Discharge status: Home / Self Care | Payer: PPO | Source: Ambulatory Visit | Attending: Internal Medicine | Admitting: Internal Medicine

## 2016-11-05 DIAGNOSIS — E2839 Other primary ovarian failure: Secondary | ICD-10-CM | POA: Diagnosis not present

## 2016-11-05 DIAGNOSIS — Z13820 Encounter for screening for osteoporosis: Secondary | ICD-10-CM

## 2016-11-12 ENCOUNTER — Encounter: Payer: Self-pay | Admitting: Internal Medicine

## 2017-02-07 ENCOUNTER — Other Ambulatory Visit: Payer: Self-pay | Admitting: Internal Medicine

## 2017-04-17 ENCOUNTER — Other Ambulatory Visit: Payer: Self-pay | Admitting: Internal Medicine

## 2017-04-17 DIAGNOSIS — E038 Other specified hypothyroidism: Secondary | ICD-10-CM

## 2017-04-17 DIAGNOSIS — Z0000 Encounter for general adult medical examination without abnormal findings: Secondary | ICD-10-CM

## 2017-04-17 DIAGNOSIS — E782 Mixed hyperlipidemia: Secondary | ICD-10-CM

## 2017-04-17 DIAGNOSIS — R7303 Prediabetes: Secondary | ICD-10-CM

## 2017-04-23 ENCOUNTER — Encounter: Payer: Self-pay | Admitting: Internal Medicine

## 2017-04-23 ENCOUNTER — Ambulatory Visit (INDEPENDENT_AMBULATORY_CARE_PROVIDER_SITE_OTHER): Payer: PPO | Admitting: Internal Medicine

## 2017-04-23 VITALS — BP 126/74 | HR 72 | Ht 66.0 in | Wt 155.0 lb

## 2017-04-23 DIAGNOSIS — E038 Other specified hypothyroidism: Secondary | ICD-10-CM | POA: Diagnosis not present

## 2017-04-23 LAB — TSH: TSH: 10.16 u[IU]/mL — AB (ref 0.35–4.50)

## 2017-04-23 LAB — T4, FREE: FREE T4: 0.67 ng/dL (ref 0.60–1.60)

## 2017-04-23 NOTE — Patient Instructions (Addendum)
Please continue Levothyroxine 88 mcg 6/7 days.  Take the thyroid hormone every day, with water, at least 30 minutes before breakfast, separated by at least 4 hours from: - acid reflux medications - calcium - iron - multivitamins  Please stop at the lab.   Please return in 1 year.

## 2017-04-23 NOTE — Progress Notes (Signed)
Patient ID: Elizabeth Espinoza, female   DOB: 02-May-1940, 77 y.o.   MRN: [id number]    HPI  Elizabeth Espinoza is a 77 y.o.-year-old female, referred by her PCP, Dr. Quay Burow for management of hypothyroidism. She also has hair loss. Last OV 6 mo ago.  Pt. has been dx with hypothyroidism in 1994 >> on Synthroid initially >> now after changed to [facility] >> Levothyroxine generic 88 mcg 6/7 days (decreased dose 08/2016).  She takes the thyroid hormone: - at 2 am, eats an early dinner - with water - separated by >30 min from b'fast  - + Nexium in am - no calcium, no iron, no MVI - + Mg at night  - + Hair Skin and Nails  - not in last 5 days  I reviewed pt's thyroid tests - normal at last checks: Lab Results  Component Value Date   TSH 3.89 10/26/2016   TSH 0.47 06/27/2016   TSH 1.40 08/11/2015   TSH 0.14 (L) 06/13/2015   TSH 0.14 (L) 05/16/2015   TSH 8.73 (H) 02/23/2015   TSH 3.45 06/11/2014   TSH 0.82 06/09/2013   TSH 2.49 11/23/2011   TSH 4.94 05/31/2011   FREET4 0.93 10/26/2016   FREET4 1.30 06/13/2015   FREET4 1.58 05/16/2015   FREET4 0.90 02/23/2015   FREET4 1.16 01/21/2007    Ab's were neg: Component     Latest Ref Rng & Units 10/26/2016  Thyroperoxidase Ab SerPl-aCnc     <9 IU/mL 1  Thyroglobulin Ab     < or = 1 IU/mL 1   She started a Hair Skin and Nail vitamin >> but no changes in hair loss; but improved since last visit.  Pt denies: - feeling nodules in neck - hoarseness - dysphagia - choking - SOB with lying down  She has + FH of thyroid disorders in: P aunt. No FH of thyroid cancer. No h/o radiation tx to head or neck.  No seaweed or kelp. No recent contrast studies. No herbal supplements. No Biotin use. No recent steroids use.   Pt. also has a history of GERD, prediabetes, MVP, hiatal hernial, RLS.  ROS: Constitutional: no weight gain/no weight loss, no fatigue, no subjective hyperthermia, no subjective hypothermia Eyes: no blurry vision, no  xerophthalmia ENT: no sore throat, + see HPI Cardiovascular: no CP/no SOB/no palpitations/no leg swelling Respiratory: no cough/no SOB/no wheezing Gastrointestinal: no N/no V/no D/no C/no acid reflux Musculoskeletal: no muscle aches/no joint aches Skin: no rashes, + improved hair loss Neurological: no tremors/no numbness/no tingling/no dizziness  I reviewed pt's medications, allergies, PMH, social hx, family hx, and changes were documented in the history of present illness. Otherwise, unchanged from my initial visit note.  Past Medical History:  Diagnosis Date  . Allergic rhinitis   . GERD (gastroesophageal reflux disease)   . Glaucoma    SUSPECT  . Hiatal hernia   . Hyperlipidemia    LDL goal = < 100  . Hypothyroidism   . MVP (mitral valve prolapse)    documented on 2 D ECHO   Past Surgical History:  Procedure Laterality Date  . APPENDECTOMY     with TAH  . CARDIAC CATHETERIZATION  1989   negative  . COLONOSCOPY  2005   Dr Olevia Perches  . COLONOSCOPY  01/2013   Dr Collene Mares  . esophageal dilation  07/04/2011   Dr Olevia Perches  . FLEXIBLE SIGMOIDOSCOPY  1999    Dr Olevia Perches  . KNEE ARTHROSCOPY  2001, 2005  Dr Percell Miller   bilat  . TOTAL ABDOMINAL HYSTERECTOMY     for Endometriosis; no BSO, Dr Colin Ina  . UPPER GI ENDOSCOPY  01/2013   Dr Collene Mares   Social History   Social History  . Marital status: Married    Spouse name: Jeneen Rinks  . Number of children: 2   Occupational History  . retired from [employer]  . Smoking status: Never Smoker  . Smokeless tobacco: Never Used  . Alcohol use Yes     Comment:  rarely  . Drug use: No   Current Outpatient Medications on File Prior to Visit  Medication Sig Dispense Refill  . Cholecalciferol (VITAMIN D-3 PO) Take by mouth daily.    Marland Kitchen Fexofenadine HCl (ALLEGRA PO) Take by mouth.    . levothyroxine (SYNTHROID, LEVOTHROID) 88 MCG tablet Taking one tablet 6 days a week 90 tablet 3  . MAGNESIUM CARBONATE PO Take by  mouth.    . Melatonin 3 MG SUBL Place 3 mg under the tongue at bedtime.    . pravastatin (PRAVACHOL) 40 MG tablet TAKE 1 TABLET BY MOUTH EVERY DAY 90 tablet 1  . Probiotic Product (PROBIOTIC DAILY PO) Take 1 capsule by mouth daily. ULTRA FLORA    . rOPINIRole (REQUIP) 0.5 MG tablet TAKE 2 TABLETS BY MOUTH  AFTER DINNER EACH NIGHT. 180 tablet 1  . [DISCONTINUED] loratadine (CLARITIN) 10 MG tablet Take 10 mg by mouth daily as needed.       No current facility-administered medications on file prior to visit.    Allergies  Allergen Reactions  . Caffeine     palpitations  . Chlorpheniramine-Pseudoeph     palpitations  . Maxifed     palpitations   Family History  Problem Relation Age of Onset  . Lung cancer Father        Asbestos with mesothelioma  . Heart disease Paternal Grandmother        ? etiology  . Diabetes Paternal Grandmother   . Diabetes Brother   . Uterine cancer Paternal Aunt   . Stroke Paternal Grandfather        > 70  . Heart attack Brother 40  . Allergies Sister   . Aneurysm Mother        congenital  . Stroke Brother        two strokes  . Barrett's esophagus Brother     PE: BP 126/74   Pulse 72   Ht _0  (1.676 m)   Wt 155 lb (70.3 kg)   SpO2 98%   BMI 25.02 kg/m  Wt Readings from Last 3 Encounters:  04/23/17 155 lb (70.3 kg)  10/23/16 157 lb (71.2 kg)  07/30/16 152 lb (68.9 kg)   Constitutional: normal weight, in NAD Eyes: PERRLA, EOMI, no exophthalmos ENT: moist mucous membranes, no thyromegaly, no cervical lymphadenopathy Cardiovascular: RRR, No MRG Respiratory: CTA B Gastrointestinal: abdomen soft, NT, ND, BS+ Musculoskeletal: no deformities, strength intact in all 4 Skin: moist, warm, no rashes, + diffuse and  frontal mild alopecia Neurological: no tremor with outstretched hands, DTR normal in all 4  ASSESSMENT: 1. Hypothyroidism  2. Hair loss  PLAN:  1. Patient with long standing hypothyroidism, on levothyroxine therapy  - at last  visit, her TPO and ATA Ab's were not elevated >> no Hashimoto's thyroiditis - she appears euthyroid and has no complaints at this visit. She started to exercise more and her fatigue and weight gain resolved  -  latest thyroid labs reviewed with pt in 10/2017 >> normal  - she continues on LT4 88 mcg 6/7 days - pt feels good on this dose. - we discussed about taking the thyroid hormone every day, with water, >30 minutes before breakfast, separated by >4 hours from acid reflux medications, calcium, iron, multivitamins. Pt. is taking it correctly. - will check thyroid tests today: TSH and fT4 as she is now 5 days off Biotin - If labs are abnormal, she will need to return for repeat TFTs in 1.5 months - OTW, RTC in 1 year  2. Hair loss - improved - continue Hair skin and nails >> advised to stop this 5 days before repeat TFTs in the future (she is off this for 5 days today)  Orders Placed This Encounter  Procedures  . TSH  . T4, free   Component     Latest Ref Rng & Units 04/23/2017  TSH     0.35 - 4.50 uIU/mL 10.16 (H)  T4,Free(Direct)     0.60 - 1.60 ng/dL 0.67   TSH significantly increased. Will increase the dose to 88 mcg daily and move her LT4 to am and Nexium before lunch. Rpt labs in 1.5 mo.  Philemon Kingdom, MD PhD [facility]

## 2017-04-24 MED ORDER — LEVOTHYROXINE SODIUM 88 MCG PO TABS: ORAL_TABLET | ORAL | 3 refills | Status: DC

## 2017-05-01 DIAGNOSIS — N952 Postmenopausal atrophic vaginitis: Secondary | ICD-10-CM | POA: Diagnosis not present

## 2017-05-01 DIAGNOSIS — Z6825 Body mass index (BMI) 25.0-25.9, adult: Secondary | ICD-10-CM | POA: Diagnosis not present

## 2017-05-01 DIAGNOSIS — Z01419 Encounter for gynecological examination (general) (routine) without abnormal findings: Secondary | ICD-10-CM | POA: Diagnosis not present

## 2017-05-01 DIAGNOSIS — Z130 Encounter for screening for diseases of the blood and blood-forming organs and certain disorders involving the immune mechanism: Secondary | ICD-10-CM | POA: Diagnosis not present

## 2017-05-01 DIAGNOSIS — Z1389 Encounter for screening for other disorder: Secondary | ICD-10-CM | POA: Diagnosis not present

## 2017-05-13 ENCOUNTER — Other Ambulatory Visit: Payer: Self-pay | Admitting: Internal Medicine

## 2017-06-05 ENCOUNTER — Other Ambulatory Visit: Payer: Self-pay | Admitting: Internal Medicine

## 2017-06-05 DIAGNOSIS — Z1231 Encounter for screening mammogram for malignant neoplasm of breast: Secondary | ICD-10-CM

## 2017-06-18 ENCOUNTER — Other Ambulatory Visit: Payer: PPO

## 2017-06-18 ENCOUNTER — Ambulatory Visit: Payer: PPO

## 2017-06-18 ENCOUNTER — Telehealth: Payer: Self-pay | Admitting: Internal Medicine

## 2017-06-18 NOTE — Telephone Encounter (Signed)
Pt informed of below. She will wait until next week.

## 2017-06-18 NOTE — Telephone Encounter (Signed)
If the dose of Biotin in her Hair Skin and Nails is >1000 mcg, she will need to wait 5 days. If not, she can come today.

## 2017-06-18 NOTE — Telephone Encounter (Signed)
Patient was suppose to stop taking biotin 5 days before her lab appt she states she has not taken this is 3 day and would like to know if it is still okay to come in today to have her blood work done or does she need to wait till 5 days off the medication    Please advise

## 2017-06-20 ENCOUNTER — Other Ambulatory Visit: Payer: PPO

## 2017-06-20 ENCOUNTER — Ambulatory Visit: Payer: PPO | Admitting: Internal Medicine

## 2017-06-25 ENCOUNTER — Other Ambulatory Visit (INDEPENDENT_AMBULATORY_CARE_PROVIDER_SITE_OTHER): Payer: PPO

## 2017-06-25 DIAGNOSIS — E038 Other specified hypothyroidism: Secondary | ICD-10-CM | POA: Diagnosis not present

## 2017-06-25 LAB — TSH: TSH: 0.73 u[IU]/mL (ref 0.35–4.50)

## 2017-06-25 LAB — T4, FREE: FREE T4: 1.08 ng/dL (ref 0.60–1.60)

## 2017-06-30 NOTE — Progress Notes (Signed)
Subjective:    Patient ID: Elizabeth Espinoza, female    DOB: 04/11/1940, 77 y.o.   MRN: [id number]  HPI She is here for a physical exam.   She is having a breakout on her neck and around her eyes - red blotches on her neck - looked like hives.  She took benadryl.  She has been applying cortisone on her neck. The areas burn, sting and itch. She had a similar breakout last summer and she needed a steroid taper. She denies any obvious cause - new products, etc.    Cough:  She has been coughing and occasionally coughing up green sputum.  She has had the cough since spring, but only occasionally brings something up.  She has occasional SOB with exertion, but this is not new.  She denies fever, wheeze.  She has allergies and is taking allegra.    Medications and allergies reviewed with patient and updated if appropriate.  Patient Active Problem List   Diagnosis Date Noted  . Herpes zoster without complication 27/25/3664  . Prediabetes 02/24/2015  . Abnormal chest x-ray 12/11/2012  . Barrett's esophagus 12/11/2012  . Anemia 12/09/2012  . RLS (restless legs syndrome) 04/13/2011  . 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 ARTHRALGIA 09/01/2008  . HIATAL HERNIA 07/31/2007  . MITRAL VALVE PROLAPSE, HX OF 07/31/2007  . ALLERGIC RHINITIS WITH CONJUNCTIVITIS 06/19/2007  . GASTROESOPHAGEAL REFLUX DISEASE, CHRONIC 06/19/2007  . Hypothyroidism 02/07/2007  . HYPERLIPIDEMIA 02/07/2007    Current Outpatient Medications on File Prior to Visit  Medication Sig Dispense Refill  . Cholecalciferol (VITAMIN D-3 PO) Take by mouth daily.    Marland Kitchen Fexofenadine HCl (ALLEGRA PO) Take by mouth.    . levothyroxine (SYNTHROID, LEVOTHROID) 88 MCG tablet Taking one tablet in am daily 90 tablet 3  . MAGNESIUM CARBONATE PO Take by mouth.    . pravastatin (PRAVACHOL) 40 MG tablet TAKE 1 TABLET BY MOUTH EVERY DAY 90 tablet 1  . Probiotic Product (PROBIOTIC DAILY PO) Take 1 capsule by mouth  daily. ULTRA FLORA    . rOPINIRole (REQUIP) 0.5 MG tablet TAKE 2 TABLETS BY MOUTH  AFTER DINNER EACH NIGHT.  --Office visit needed for further refills 180 tablet 0  . [DISCONTINUED] loratadine (CLARITIN) 10 MG tablet Take 10 mg by mouth daily as needed.       No current facility-administered medications on file prior to visit.     Past Medical History:  Diagnosis Date  . Allergic rhinitis   . GERD (gastroesophageal reflux disease)   . Glaucoma    SUSPECT  . Hiatal hernia   . Hyperlipidemia    LDL goal = < 100  . Hypothyroidism   . MVP (mitral valve prolapse)    documented on 2 D ECHO    Past Surgical History:  Procedure Laterality Date  . APPENDECTOMY     with TAH  . CARDIAC CATHETERIZATION  1989   negative  . COLONOSCOPY  2005   Dr Olevia Perches  . COLONOSCOPY  01/2013   Dr Collene Mares  . esophageal dilation  07/04/2011   Dr Olevia Perches  . FLEXIBLE SIGMOIDOSCOPY  1999    Dr Olevia Perches  . KNEE ARTHROSCOPY  2001, 2005   Dr Percell Miller   bilat  . TOTAL ABDOMINAL HYSTERECTOMY     for Endometriosis; no BSO, Dr Colin Ina  . UPPER GI ENDOSCOPY  01/2013   Dr Collene Mares    Social History   Socioeconomic History  . Marital status: Married  Spouse name: Jeneen Rinks  . Number of children: Y  . Years of education: Not on file  . Highest education level: Not on file  Occupational History  . Occupation: retired from [employer]  . Financial resource strain: Not on file  . Food insecurity:    Worry: Not on file    Inability: Not on file  . Transportation needs:    Medical: Not on file    Non-medical: Not on file  Tobacco Use  . Smoking status: Never Smoker  . Smokeless tobacco: Never Used  Substance and Sexual Activity  . Alcohol use: Yes    Comment:  rarely  . Drug use: No  . Sexual activity: Yes  Lifestyle  . Physical activity:    Days per week: Not on file    Minutes per session: Not on file  . Stress: Not on file  Relationships  . Social connections:    Talks on phone: Not on  file    Gets together: Not on file    Attends religious service: Not on file    Active member of club or organization: Not on file    Attends meetings of clubs or organizations: Not on file    Relationship status: Not on file  Other Topics Concern  . Not on file  Social History Narrative  . Not on file    Family History  Problem Relation Age of Onset  . Lung cancer Father        Asbestos with mesothelioma  . Heart disease Paternal Grandmother        ? etiology  . Diabetes Paternal Grandmother   . Diabetes Brother   . Uterine cancer Paternal Aunt   . Stroke Paternal Grandfather        > 10  . Heart attack Brother 41  . Allergies Sister   . Aneurysm Mother        congenital  . Stroke Brother        two strokes  . Barrett's esophagus Brother     Review of Systems  Constitutional: Positive for fatigue. Negative for chills and fever.  HENT: Positive for congestion (mild - allergy related). Negative for sore throat and trouble swallowing.   Eyes: Negative for visual disturbance.  Respiratory: Positive for cough (occ coughs up green sputum) and shortness of breath (at times). Negative for wheezing.   Cardiovascular: Negative for chest pain, palpitations and leg swelling.  Gastrointestinal: Negative for abdominal pain, blood in stool, constipation, diarrhea and nausea.       Jerrye Bushy  - takes nexium  Genitourinary: Negative for dysuria and hematuria.  Musculoskeletal: Positive for arthralgias and back pain (lower back, left hip).  Skin: Positive for rash.  Neurological: Negative for light-headedness and headaches.  Psychiatric/Behavioral: Negative for dysphoric mood. The patient is not nervous/anxious.        Objective:   Vitals:   07/02/17 1106  BP: 124/78  Pulse: 100  Resp: 16  Temp: 98.1 F (36.7 C)  SpO2: 95%   Filed Weights   07/02/17 1106  Weight: 150 lb (68 kg)   Body mass index is 24.21 kg/m.  Wt Readings from Last 3 Encounters:  07/02/17 150 lb (68 kg)    04/23/17 155 lb (70.3 kg)  10/23/16 157 lb (71.2 kg)     Physical Exam Constitutional: She appears well-developed and well-nourished. No distress.  HENT:  Head: Normocephalic and atraumatic.  Right Ear: External ear normal. Normal ear canal and TM  Left Ear: External ear normal.  Normal ear canal and TM Mouth/Throat: Oropharynx is clear and moist.  Eyes: Conjunctivae and EOM are normal.  Neck: Neck supple. No tracheal deviation present. No thyromegaly present.  No carotid bruit  Cardiovascular: Normal rate, regular rhythm and normal heart sounds.   No murmur heard.  No edema. Pulmonary/Chest: Effort normal and breath sounds normal. No respiratory distress. She has no wheezes. She has no rales.  Breast: deferred to Gyn Abdominal: Soft. She exhibits no distension. There is no tenderness.  Lymphadenopathy: She has no cervical adenopathy.  Skin: Skin is warm and dry. She is not diaphoretic. Mildly erythematous/dry skin b/l orbits and very mild on neck Psychiatric: She has a normal mood and affect. Her behavior is normal.        Assessment & Plan:   Physical exam: Screening blood work  ordreed Immunizations  Discussed shingrix, pneumovax today Colonoscopy  Up to date - done 2009 - given age - no longer needed Mammogram  Scheduled for 07/03/17 Gyn -  No longer sees Dexa   Up to date  Eye exams   Up to date  EKG   Done 09/2014 Exercise  Goes to [facility], walking  Weight  Normal BMI Skin   Current rash - will treat with steroids Substance abuse   none  See Problem List for Assessment and Plan of chronic medical problems.   FU in 1 year

## 2017-06-30 NOTE — Patient Instructions (Addendum)
Test(s) ordered today. Your results will be released to [place] (or called to you) after review, usually within 72hours after test completion. If any changes need to be made, you will be notified at that same time.  All other Health Maintenance issues reviewed.   All recommended immunizations and age-appropriate screenings are up-to-date or discussed.  Pneumonia immunization administered today.   Medications reviewed and updated.  Changes include taking a steroid taper for your rash.  Call if there is no improvement.    Your prescription(s) have been submitted to your pharmacy. Please take as directed and contact our office if you believe you are having problem(s) with the medication(s).   Please followup in 1 year   Health Maintenance, Female Adopting a healthy lifestyle and getting preventive care can go a long way to promote health and wellness. Talk with your health care provider about what schedule of regular examinations is right for you. This is a good chance for you to check in with your provider about disease prevention and staying healthy. In between checkups, there are plenty of things you can do on your own. Experts have done a lot of research about which lifestyle changes and preventive measures are most likely to keep you healthy. Ask your health care provider for more information. Weight and diet Eat a healthy diet  Be sure to include plenty of vegetables, fruits, low-fat dairy products, and lean protein.  Do not eat a lot of foods high in solid fats, added sugars, or salt.  Get regular exercise. This is one of the most important things you can do for your health. ? Most adults should exercise for at least 150 minutes each week. The exercise should increase your heart rate and make you sweat (moderate-intensity exercise). ? Most adults should also do strengthening exercises at least twice a week. This is in addition to the moderate-intensity exercise.  Maintain a healthy  weight  Body mass index (BMI) is a measurement that can be used to identify possible weight problems. It estimates body fat based on height and weight. Your health care provider can help determine your BMI and help you achieve or maintain a healthy weight.  For females 64 years of age and older: ? A BMI below 18.5 is considered underweight. ? A BMI of 18.5 to 24.9 is normal. ? A BMI of 25 to 29.9 is considered overweight. ? A BMI of 30 and above is considered obese.  Watch levels of cholesterol and blood lipids  You should start having your blood tested for lipids and cholesterol at 76 years of age, then have this test every 5 years.  You may need to have your cholesterol levels checked more often if: ? Your lipid or cholesterol levels are high. ? You are older than 76 years of age. ? You are at high risk for heart disease.  Cancer screening Lung Cancer  Lung cancer screening is recommended for adults 41-57 years old who are at high risk for lung cancer because of a history of smoking.  A yearly low-dose CT scan of the lungs is recommended for people who: ? Currently smoke. ? Have quit within the past 15 years. ? Have at least a 30-pack-year history of smoking. A pack year is smoking an average of one pack of cigarettes a day for 1 year.  Yearly screening should continue until it has been 15 years since you quit.  Yearly screening should stop if you develop a health problem that would prevent you  from having lung cancer treatment.  Breast Cancer  Practice breast self-awareness. This means understanding how your breasts normally appear and feel.  It also means doing regular breast self-exams. Let your health care provider know about any changes, no matter how small.  If you are in your 20s or 30s, you should have a clinical breast exam (CBE) by a health care provider every 1-3 years as part of a regular health exam.  If you are 71 or older, have a CBE every year. Also consider  having a breast X-ray (mammogram) every year.  If you have a family history of breast cancer, talk to your health care provider about genetic screening.  If you are at high risk for breast cancer, talk to your health care provider about having an MRI and a mammogram every year.  Breast cancer gene (BRCA) assessment is recommended for women who have family members with BRCA-related cancers. BRCA-related cancers include: ? Breast. ? Ovarian. ? Tubal. ? Peritoneal cancers.  Results of the assessment will determine the need for genetic counseling and BRCA1 and BRCA2 testing.  Cervical Cancer Your health care provider may recommend that you be screened regularly for cancer of the pelvic organs (ovaries, uterus, and vagina). This screening involves a pelvic examination, including checking for microscopic changes to the surface of your cervix (Pap test). You may be encouraged to have this screening done every 3 years, beginning at age 9.  For women ages 76-65, health care providers may recommend pelvic exams and Pap testing every 3 years, or they may recommend the Pap and pelvic exam, combined with testing for human papilloma virus (HPV), every 5 years. Some types of HPV increase your risk of cervical cancer. Testing for HPV may also be done on women of any age with unclear Pap test results.  Other health care providers may not recommend any screening for nonpregnant women who are considered low risk for pelvic cancer and who do not have symptoms. Ask your health care provider if a screening pelvic exam is right for you.  If you have had past treatment for cervical cancer or a condition that could lead to cancer, you need Pap tests and screening for cancer for at least 20 years after your treatment. If Pap tests have been discontinued, your risk factors (such as having a new sexual partner) need to be reassessed to determine if screening should resume. Some women have medical problems that increase  the chance of getting cervical cancer. In these cases, your health care provider may recommend more frequent screening and Pap tests.  Colorectal Cancer  This type of cancer can be detected and often prevented.  Routine colorectal cancer screening usually begins at 76 years of age and continues through 76 years of age.  Your health care provider may recommend screening at an earlier age if you have risk factors for colon cancer.  Your health care provider may also recommend using home test kits to check for hidden blood in the stool.  A small camera at the end of a tube can be used to examine your colon directly (sigmoidoscopy or colonoscopy). This is done to check for the earliest forms of colorectal cancer.  Routine screening usually begins at age 99.  Direct examination of the colon should be repeated every 5-10 years through 76 years of age. However, you may need to be screened more often if early forms of precancerous polyps or small growths are found.  Skin Cancer  Check your skin from  head to toe regularly.  Tell your health care provider about any new moles or changes in moles, especially if there is a change in a mole's shape or color.  Also tell your health care provider if you have a mole that is larger than the size of a pencil eraser.  Always use sunscreen. Apply sunscreen liberally and repeatedly throughout the day.  Protect yourself by wearing long sleeves, pants, a wide-brimmed hat, and sunglasses whenever you are outside.  Heart disease, diabetes, and high blood pressure  High blood pressure causes heart disease and increases the risk of stroke. High blood pressure is more likely to develop in: ? People who have blood pressure in the high end of the normal range (130-139/85-89 mm Hg). ? People who are overweight or obese. ? People who are African American.  If you are 10-73 years of age, have your blood pressure checked every 3-5 years. If you are 21 years of age  or older, have your blood pressure checked every year. You should have your blood pressure measured twice-once when you are at a hospital or clinic, and once when you are not at a hospital or clinic. Record the average of the two measurements. To check your blood pressure when you are not at a hospital or clinic, you can use: ? An automated blood pressure machine at a pharmacy. ? A home blood pressure monitor.  If you are between 16 years and 67 years old, ask your health care provider if you should take aspirin to prevent strokes.  Have regular diabetes screenings. This involves taking a blood sample to check your fasting blood sugar level. ? If you are at a normal weight and have a low risk for diabetes, have this test once every three years after 76 years of age. ? If you are overweight and have a high risk for diabetes, consider being tested at a younger age or more often. Preventing infection Hepatitis B  If you have a higher risk for hepatitis B, you should be screened for this virus. You are considered at high risk for hepatitis B if: ? You were born in a country where hepatitis B is common. Ask your health care provider which countries are considered high risk. ? Your parents were born in a high-risk country, and you have not been immunized against hepatitis B (hepatitis B vaccine). ? You have HIV or AIDS. ? You use needles to inject street drugs. ? You live with someone who has hepatitis B. ? You have had sex with someone who has hepatitis B. ? You get hemodialysis treatment. ? You take certain medicines for conditions, including cancer, organ transplantation, and autoimmune conditions.  Hepatitis C  Blood testing is recommended for: ? Everyone born from [birth year] through 1964. ? Anyone with known risk factors for hepatitis C.  Sexually transmitted infections (STIs)  You should be screened for sexually transmitted infections (STIs) including gonorrhea and chlamydia if: ? You are  sexually active and are younger than 76 years of age. ? You are older than 76 years of age and your health care provider tells you that you are at risk for this type of infection. ? Your sexual activity has changed since you were last screened and you are at an increased risk for chlamydia or gonorrhea. Ask your health care provider if you are at risk.  If you do not have HIV, but are at risk, it may be recommended that you take a prescription medicine daily to prevent  HIV infection. This is called pre-exposure prophylaxis (PrEP). You are considered at risk if: ? You are sexually active and do not regularly use condoms or know the HIV status of your partner(s). ? You take drugs by injection. ? You are sexually active with a partner who has HIV.  Talk with your health care provider about whether you are at high risk of being infected with HIV. If you choose to begin PrEP, you should first be tested for HIV. You should then be tested every 3 months for as long as you are taking PrEP. Pregnancy  If you are premenopausal and you may become pregnant, ask your health care provider about preconception counseling.  If you may become pregnant, take 400 to 800 micrograms (mcg) of folic acid every day.  If you want to prevent pregnancy, talk to your health care provider about birth control (contraception). Osteoporosis and menopause  Osteoporosis is a disease in which the bones lose minerals and strength with aging. This can result in serious bone fractures. Your risk for osteoporosis can be identified using a bone density scan.  If you are 40 years of age or older, or if you are at risk for osteoporosis and fractures, ask your health care provider if you should be screened.  Ask your health care provider whether you should take a calcium or vitamin D supplement to lower your risk for osteoporosis.  Menopause may have certain physical symptoms and risks.  Hormone replacement therapy may reduce some  of these symptoms and risks. Talk to your health care provider about whether hormone replacement therapy is right for you. Follow these instructions at home:  Schedule regular health, dental, and eye exams.  Stay current with your immunizations.  Do not use any tobacco products including cigarettes, chewing tobacco, or electronic cigarettes.  If you are pregnant, do not drink alcohol.  If you are breastfeeding, limit how much and how often you drink alcohol.  Limit alcohol intake to no more than 1 drink per day for nonpregnant women. One drink equals 12 ounces of beer, 5 ounces of wine, or 1 ounces of hard liquor.  Do not use street drugs.  Do not share needles.  Ask your health care provider for help if you need support or information about quitting drugs.  Tell your health care provider if you often feel depressed.  Tell your health care provider if you have ever been abused or do not feel safe at home. This information is not intended to replace advice given to you by your health care provider. Make sure you discuss any questions you have with your health care provider. Document Released: 07/24/2010 Document Revised: 06/16/2015 Document Reviewed: 10/12/2014 Elsevier Interactive Patient Education  [employer].

## 2017-07-02 ENCOUNTER — Other Ambulatory Visit (INDEPENDENT_AMBULATORY_CARE_PROVIDER_SITE_OTHER): Payer: PPO

## 2017-07-02 ENCOUNTER — Encounter: Payer: PPO | Admitting: Internal Medicine

## 2017-07-02 ENCOUNTER — Encounter: Payer: Self-pay | Admitting: Internal Medicine

## 2017-07-02 ENCOUNTER — Ambulatory Visit (INDEPENDENT_AMBULATORY_CARE_PROVIDER_SITE_OTHER): Payer: PPO | Admitting: *Deleted

## 2017-07-02 ENCOUNTER — Ambulatory Visit: Payer: PPO

## 2017-07-02 ENCOUNTER — Ambulatory Visit (INDEPENDENT_AMBULATORY_CARE_PROVIDER_SITE_OTHER): Payer: PPO | Admitting: Internal Medicine

## 2017-07-02 VITALS — BP 124/78 | HR 95 | Resp 16 | Ht 66.0 in | Wt 150.0 lb

## 2017-07-02 VITALS — BP 124/78 | HR 100 | Temp 98.1°F | Resp 16 | Ht 66.0 in | Wt 150.0 lb

## 2017-07-02 DIAGNOSIS — E782 Mixed hyperlipidemia: Secondary | ICD-10-CM

## 2017-07-02 DIAGNOSIS — Z0000 Encounter for general adult medical examination without abnormal findings: Secondary | ICD-10-CM

## 2017-07-02 DIAGNOSIS — Z0001 Encounter for general adult medical examination with abnormal findings: Secondary | ICD-10-CM | POA: Diagnosis not present

## 2017-07-02 DIAGNOSIS — Z23 Encounter for immunization: Secondary | ICD-10-CM | POA: Diagnosis not present

## 2017-07-02 DIAGNOSIS — R7303 Prediabetes: Secondary | ICD-10-CM | POA: Diagnosis not present

## 2017-07-02 DIAGNOSIS — K219 Gastro-esophageal reflux disease without esophagitis: Secondary | ICD-10-CM

## 2017-07-02 DIAGNOSIS — E038 Other specified hypothyroidism: Secondary | ICD-10-CM

## 2017-07-02 DIAGNOSIS — R21 Rash and other nonspecific skin eruption: Secondary | ICD-10-CM | POA: Diagnosis not present

## 2017-07-02 DIAGNOSIS — G2581 Restless legs syndrome: Secondary | ICD-10-CM | POA: Diagnosis not present

## 2017-07-02 LAB — CBC WITH DIFFERENTIAL/PLATELET
Basophils Absolute: 0.1 10*3/uL (ref 0.0–0.1)
Basophils Relative: 0.7 % (ref 0.0–3.0)
Eosinophils Absolute: 0.3 10*3/uL (ref 0.0–0.7)
Eosinophils Relative: 3.3 % (ref 0.0–5.0)
HEMATOCRIT: 39.8 % (ref 36.0–46.0)
HEMOGLOBIN: 13.3 g/dL (ref 12.0–15.0)
Lymphocytes Relative: 33.9 % (ref 12.0–46.0)
Lymphs Abs: 2.9 10*3/uL (ref 0.7–4.0)
MCHC: 33.4 g/dL (ref 30.0–36.0)
MCV: 89.4 fl (ref 78.0–100.0)
Monocytes Absolute: 0.8 10*3/uL (ref 0.1–1.0)
Monocytes Relative: 8.8 % (ref 3.0–12.0)
Neutro Abs: 4.6 10*3/uL (ref 1.4–7.7)
Neutrophils Relative %: 53.3 % (ref 43.0–77.0)
Platelets: 236 10*3/uL (ref 150.0–400.0)
RBC: 4.46 Mil/uL (ref 3.87–5.11)
RDW: 14.1 % (ref 11.5–15.5)
WBC: 8.6 10*3/uL (ref 4.0–10.5)

## 2017-07-02 LAB — COMPREHENSIVE METABOLIC PANEL
ALT: 13 U/L (ref 0–35)
AST: 17 U/L (ref 0–37)
Albumin: 4.3 g/dL (ref 3.5–5.2)
Alkaline Phosphatase: 73 U/L (ref 39–117)
BILIRUBIN TOTAL: 0.6 mg/dL (ref 0.2–1.2)
BUN: 13 mg/dL (ref 6–23)
CALCIUM: 9.7 mg/dL (ref 8.4–10.5)
CO2: 30 meq/L (ref 19–32)
CREATININE: 0.8 mg/dL (ref 0.40–1.20)
Chloride: 105 mEq/L (ref 96–112)
GFR: 74.09 mL/min (ref >60.00)
GLUCOSE: 99 mg/dL (ref 70–99)
Potassium: 4.7 mEq/L (ref 3.5–5.1)
Sodium: 141 mEq/L (ref 135–145)
Total Protein: 6.8 g/dL (ref 6.0–8.3)

## 2017-07-02 LAB — HEMOGLOBIN A1C: Hgb A1c MFr Bld: 6 % (ref 4.6–6.5)

## 2017-07-02 LAB — LIPID PANEL
CHOL/HDL RATIO: 4
Cholesterol: 156 mg/dL (ref 0–200)
HDL: 43.1 mg/dL (ref >39.00)
LDL Cholesterol: 80 mg/dL (ref 0–99)
NonHDL: 112.69
TRIGLYCERIDES: 164 mg/dL — AB (ref 0.0–149.0)
VLDL: 32.8 mg/dL (ref 0.0–40.0)

## 2017-07-02 MED ORDER — ESOMEPRAZOLE MAGNESIUM 20 MG PO CPDR: 20.0000 mg | DELAYED_RELEASE_CAPSULE | Freq: Every day | ORAL | Status: DC

## 2017-07-02 MED ORDER — PREDNISONE 10 MG PO TABS: ORAL_TABLET | ORAL | 0 refills | Status: DC

## 2017-07-02 NOTE — Assessment & Plan Note (Signed)
Taking requip nightly - fairly effective Will continue

## 2017-07-02 NOTE — Assessment & Plan Note (Signed)
Lab Results  Component Value Date   HGBA1C 6.0 06/27/2016   Check A1c She is exercising regularly and eating healthy She has lost some weight and plans on trying to lose an additional 5 pounds Okay to follow-up annually given good control and healthy lifestyle

## 2017-07-02 NOTE — Progress Notes (Addendum)
Subjective:   Elizabeth Espinoza is a 77 y.o. female who presents for Medicare Annual (Subsequent) preventive examination.  Review of Systems:  No ROS.  Medicare Wellness Visit. Additional risk factors are reflected in the social history.  Cardiac Risk Factors include: advanced age (>28mn, >>63women);dyslipidemia Sleep patterns: has restless sleep, gets up 1 times nightly to void and sleeps 5-6 hours nightly.  Patient reports insomnia issues, discussed recommended sleep tips.   Home Safety/Smoke Alarms: Feels safe in home. Smoke alarms in place.  Living environment; residence and Firearm Safety: [place] can live on one level, no firearms., Lives with husband, no needs for DME, good support system Seat Belt Safety/Bike Helmet: Wears seat belt.     Objective:     Vitals: BP 124/78   Pulse 95   Resp 16   Ht _0  (1.676 m)   Wt 150 lb (68 kg)   SpO2 100%   BMI 24.21 kg/m   Body mass index is 24.21 kg/m.  Advanced Directives 07/02/2017 06/14/2016  Does Patient Have a Medical Advance Directive? Yes Yes  Type of AParamedicof [place]Living will [place] will  Copy of HClayin Chart? No - copy requested No - copy requested    Tobacco Social History   Tobacco Use  Smoking Status Never Smoker  Smokeless Tobacco Never Used     Counseling given: Not Answered  Past Medical History:  Diagnosis Date  . Allergic rhinitis   . GERD (gastroesophageal reflux disease)   . Glaucoma    SUSPECT  . Hiatal hernia   . Hyperlipidemia    LDL goal = < 100  . Hypothyroidism   . MVP (mitral valve prolapse)    documented on 2 D ECHO   Past Surgical History:  Procedure Laterality Date  . APPENDECTOMY     with TAH  . CARDIAC CATHETERIZATION  1989   negative  . COLONOSCOPY  2005   Dr BOlevia Perches . COLONOSCOPY  01/2013   Dr mCollene Mares . esophageal dilation  07/04/2011   Dr BOlevia Perches . FLEXIBLE SIGMOIDOSCOPY  1999      Dr BOlevia Perches . KNEE ARTHROSCOPY  2001, 2005   Dr MPercell Miller  bilat  . TOTAL ABDOMINAL HYSTERECTOMY     for Endometriosis; no BSO, Dr HColin Ina . UPPER GI ENDOSCOPY  01/2013   Dr MCollene Mares  Family History  Problem Relation Age of Onset  . Lung cancer Father        Asbestos with mesothelioma  . Heart disease Paternal Grandmother        ? etiology  . Diabetes Paternal Grandmother   . Diabetes Brother   . Uterine cancer Paternal Aunt   . Stroke Paternal Grandfather        > 533 . Heart attack Brother 648 . Allergies Sister   . Aneurysm Mother        congenital  . Stroke Brother        two strokes  . Barrett's esophagus Brother    Social History   Socioeconomic History  . Marital status: Married    Spouse name: JJeneen Rinks . Number of children: Y  . Years of education: Not on file  . Highest education level: Not on file  Occupational History  . Occupation: retired from [employer] . Financial resource strain: Not hard at all  . Food insecurity:  Worry: Never true    Inability: Never true  . Transportation needs:    Medical: No    Non-medical: No  Tobacco Use  . Smoking status: Never Smoker  . Smokeless tobacco: Never Used  Substance and Sexual Activity  . Alcohol use: Yes    Comment:  rarely  . Drug use: No  . Sexual activity: Yes  Lifestyle  . Physical activity:    Days per week: 5 days    Minutes per session: 50 min  . Stress: Not at all  Relationships  . Social connections:    Talks on phone: More than three times a week    Gets together: More than three times a week    Attends religious service: More than 4 times per year    Active member of club or organization: Yes    Attends meetings of clubs or organizations: More than 4 times per year    Relationship status: Married  Other Topics Concern  . Not on file  Social History Narrative  . Not on file    Outpatient Encounter Medications as of 07/02/2017  Medication Sig  . Cholecalciferol  (VITAMIN D-3 PO) Take by mouth daily.  Marland Kitchen esomeprazole (NEXIUM) 20 MG capsule Take 1 capsule (20 mg total) by mouth daily at 12 noon.  Marland Kitchen Fexofenadine HCl (ALLEGRA PO) Take by mouth.  . levothyroxine (SYNTHROID, LEVOTHROID) 88 MCG tablet Taking one tablet in am daily  . MAGNESIUM CARBONATE PO Take by mouth.  . pravastatin (PRAVACHOL) 40 MG tablet TAKE 1 TABLET BY MOUTH EVERY DAY  . predniSONE (DELTASONE) 10 MG tablet 3 tabs po qd x 3 days, then 2 tabs po qd x 3 days, then 1 tab po qd x 3 days  . Probiotic Product (PROBIOTIC DAILY PO) Take 1 capsule by mouth daily. ULTRA FLORA  . rOPINIRole (REQUIP) 0.5 MG tablet TAKE 2 TABLETS BY MOUTH  AFTER DINNER EACH NIGHT.  --Office visit needed for further refills  . [DISCONTINUED] loratadine (CLARITIN) 10 MG tablet Take 10 mg by mouth daily as needed.     No facility-administered encounter medications on file as of 07/02/2017.     Activities of Daily Living In your present state of health, do you have any difficulty performing the following activities: 07/02/2017  Hearing? N  Vision? N  Difficulty concentrating or making decisions? N  Walking or climbing stairs? N  Dressing or bathing? N  Doing errands, shopping? N  Preparing Food and eating ? N  Using the Toilet? N  In the past six months, have you accidently leaked urine? N  Do you have problems with loss of bowel control? N  Managing your Medications? N  Managing your Finances? N  Housekeeping or managing your Housekeeping? N  Some recent data might be hidden    Patient Care Team: Binnie Rail, MD as PCP - General (Internal Medicine)    Assessment:   This is a routine wellness examination for Elizabeth Espinoza. Physical assessment deferred to PCP.   Exercise Activities and Dietary recommendations Current Exercise Habits: Home exercise routine;Structured exercise class, Type of exercise: walking;strength training/weights;calisthenics, Time (Minutes): 50, Frequency (Times/Week): 5, Weekly Exercise  (Minutes/Week): 250, Intensity: Moderate  Diet (meal preparation, eat out, water intake, caffeinated beverages, dairy products, fruits and vegetables): in general, a "healthy" diet  , well balanced, eats a variety of fruits and vegetables daily, limits salt, fat/cholesterol, sugar,carbohydrates,caffeine, drinks 6-8 glasses of water daily.  Goals    . Patient Stated  Continue to be active by going to the gym, eat healthy, enjoy life, family and church       Fall Risk Fall Risk  07/02/2017 07/02/2017 06/14/2016 12/30/2015 12/29/2015  Falls in the past year? _0   Comment - - - - Emmi Telephone Survey: data to providers prior to load   I Depression Screen PHQ 2/9 Scores 07/02/2017 07/02/2017 06/14/2016 12/30/2015  PHQ - 2 Score 0 0 0 0     Cognitive Function       Ad8 score reviewed for issues:  Issues making decisions: no  Less interest in hobbies / activities: no  Repeats questions, stories (family complaining): no  Trouble using ordinary gadgets (microwave, computer, phone):no  Forgets the month or year: no  Mismanaging finances: no  Remembering appts: no  Daily problems with thinking and/or memory: no Ad8 score is= 0  Immunization History  Administered Date(s) Administered  . Influenza Split 11/07/2010  . Influenza Whole 10/30/2009, 11/27/2011  . Influenza, High Dose Seasonal PF 10/27/2012, 11/05/2013  . Influenza-Unspecified 11/24/2014, 10/07/2015, 10/08/2016  . Pneumococcal Conjugate-13 10/07/2015  . Pneumococcal Polysaccharide-23 07/02/2017  . Tdap 02/11/2012  . Zoster 01/09/2012   Screening Tests Health Maintenance  Topic Date Due  . INFLUENZA VACCINE  08/22/2017  . DEXA SCAN  11/05/2021  . TETANUS/TDAP  02/10/2022  . PNA vac Low Risk Adult  Completed      Plan:     Continue doing brain stimulating activities (puzzles, reading, adult coloring books, staying active) to keep memory sharp.   Continue to eat heart healthy diet (full of  fruits, vegetables, whole grains, lean protein, water--limit salt, fat, and sugar intake) and increase physical activity as tolerated.  I have personally reviewed and noted the following in the patient's chart:   . Medical and social history . Use of alcohol, tobacco or illicit drugs  . Current medications and supplements . Functional ability and status . Nutritional status . Physical activity . Advanced directives . List of other physicians . Vitals . Screenings to include cognitive, depression, and falls . Referrals and appointments  In addition, I have reviewed and discussed with patient certain preventive protocols, quality metrics, and best practice recommendations. A written personalized care plan for preventive services as well as general preventive health recommendations were provided to patient.     Michiel Cowboy, RN  07/02/2017   Medical screening examination/treatment/procedure(s) were performed by non-physician practitioner and as supervising physician I was immediately available for consultation/collaboration. I agree with above. Binnie Rail, MD

## 2017-07-02 NOTE — Assessment & Plan Note (Signed)
Started 1 week ago for no obvious reason-no change in products and no new exposures Appears to be allergic in nature She had a similar rash 1 year ago located on arms and legs that did not respond to antihistamines, but did respond to steroids Prednisone taper Continue antihistamines Call if no improvement

## 2017-07-02 NOTE — Patient Instructions (Addendum)
Continue doing brain stimulating activities (puzzles, reading, adult coloring books, staying active) to keep memory sharp.   Continue to eat heart healthy diet (full of fruits, vegetables, whole grains, lean protein, water--limit salt, fat, and sugar intake) and increase physical activity as tolerated.   Elizabeth Espinoza , Thank you for taking time to come for your Medicare Wellness Visit. I appreciate your ongoing commitment to your health goals. Please review the following plan we discussed and let me know if I can assist you in the future.   These are the goals we discussed: Goals    . Patient Stated     Continue to be active by going to the gym, eat healthy, enjoy life, family and church       This is a list of the screening recommended for you and due dates:  Health Maintenance  Topic Date Due  . Pneumonia vaccines (2 of 2 - PPSV23) 10/06/2016  . Flu Shot  08/22/2017  . DEXA scan (bone density measurement)  11/05/2021  . Tetanus Vaccine  02/10/2022

## 2017-07-02 NOTE — Assessment & Plan Note (Signed)
Management per endocrine

## 2017-07-02 NOTE — Assessment & Plan Note (Signed)
Check lipid panel  Continue daily statin Regular exercise and healthy diet encouraged

## 2017-07-02 NOTE — Assessment & Plan Note (Signed)
Following with Dr. Marcene Corning GERD controlled Last EGD showed no Barrett's

## 2017-07-03 ENCOUNTER — Ambulatory Visit
Admission: RE | Admit: 2017-07-03 | Discharge: 2017-07-03 | Disposition: A | Discharge status: Home / Self Care | Payer: PPO | Source: Ambulatory Visit | Attending: Internal Medicine | Admitting: Internal Medicine

## 2017-07-03 DIAGNOSIS — Z1231 Encounter for screening mammogram for malignant neoplasm of breast: Secondary | ICD-10-CM

## 2017-07-04 ENCOUNTER — Encounter: Payer: Self-pay | Admitting: Internal Medicine

## 2017-07-16 ENCOUNTER — Encounter: Payer: Self-pay | Admitting: Internal Medicine

## 2017-07-16 MED ORDER — LEVOTHYROXINE SODIUM 88 MCG PO TABS: ORAL_TABLET | ORAL | 3 refills | Status: DC

## 2017-07-16 NOTE — Telephone Encounter (Signed)
Are you okay with filling thyroid med and continuing with blood work?

## 2017-07-18 ENCOUNTER — Ambulatory Visit (INDEPENDENT_AMBULATORY_CARE_PROVIDER_SITE_OTHER): Payer: PPO | Admitting: Internal Medicine

## 2017-07-18 ENCOUNTER — Encounter: Payer: Self-pay | Admitting: Internal Medicine

## 2017-07-18 VITALS — BP 138/76 | HR 64 | Temp 97.9°F | Resp 16 | Wt 149.0 lb

## 2017-07-18 DIAGNOSIS — R21 Rash and other nonspecific skin eruption: Secondary | ICD-10-CM

## 2017-07-18 MED ORDER — HYDROXYZINE HCL 25 MG PO TABS: 25.0000 mg | ORAL_TABLET | Freq: Three times a day (TID) | ORAL | 0 refills | Status: DC | PRN

## 2017-07-18 MED ORDER — PREDNISONE 10 MG PO TABS: ORAL_TABLET | ORAL | 0 refills | Status: DC

## 2017-07-18 NOTE — Progress Notes (Signed)
Subjective:    Patient ID: Elizabeth Espinoza, female    DOB: 1940-06-14, 77 y.o.   MRN: [id number]  HPI The patient is here for an acute visit.  Rash:  She took the prednisone and it worked to get rid of the rash.  She went back to the pool yesterday and she felt irritation on her neck and eyes while she was in class.  The rash has gotten worse.   She has the rash now around her eyes, near her mouth, on her neck and on her left arm.  It Tearra Ouk and gets very red.  It itches.  She thinks she may be allergic to the chlorine.  She has swim in the past, but just started swimming again 2 months ago.  She denies any other obvious causes including new products.  She denies fever, chills, sore throat, difficulty breathing or facial swelling.  She does have a mild dry cough and is unsure if that is related to this.  She did find that the fumes from the chlorine were irritating.  She takes her Allegra daily.  She has taken Benadryl, but it does not seem to make her drowsy and she has not been sleeping well.  Medications and allergies reviewed with patient and updated if appropriate.  Patient Active Problem List   Diagnosis Date Noted  . Rash and nonspecific skin eruption 07/30/2016  . Prediabetes 02/24/2015  . Abnormal chest x-ray 12/11/2012  . Barrett's esophagus 12/11/2012  . RLS (restless legs syndrome) 04/13/2011  . 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 ARTHRALGIA 09/01/2008  . HIATAL HERNIA 07/31/2007  . MITRAL VALVE PROLAPSE, HX OF 07/31/2007  . ALLERGIC RHINITIS WITH CONJUNCTIVITIS 06/19/2007  . GASTROESOPHAGEAL REFLUX DISEASE, CHRONIC 06/19/2007  . Hypothyroidism 02/07/2007  . HYPERLIPIDEMIA 02/07/2007    Current Outpatient Medications on File Prior to Visit  Medication Sig Dispense Refill  . Cholecalciferol (VITAMIN D-3 PO) Take by mouth daily.    Marland Kitchen esomeprazole (NEXIUM) 20 MG capsule Take 1 capsule (20 mg total) by mouth  daily at 12 noon.    Marland Kitchen Fexofenadine HCl (ALLEGRA PO) Take by mouth.    . levothyroxine (SYNTHROID, LEVOTHROID) 88 MCG tablet Taking one tablet in am daily 90 tablet 3  . MAGNESIUM CARBONATE PO Take by mouth.    . pravastatin (PRAVACHOL) 40 MG tablet TAKE 1 TABLET BY MOUTH EVERY DAY 90 tablet 1  . Probiotic Product (PROBIOTIC DAILY PO) Take 1 capsule by mouth daily. ULTRA FLORA    . rOPINIRole (REQUIP) 0.5 MG tablet TAKE 2 TABLETS BY MOUTH  AFTER DINNER EACH NIGHT.  --Office visit needed for further refills 180 tablet 0  . [DISCONTINUED] loratadine (CLARITIN) 10 MG tablet Take 10 mg by mouth daily as needed.       No current facility-administered medications on file prior to visit.     Past Medical History:  Diagnosis Date  . Allergic rhinitis   . GERD (gastroesophageal reflux disease)   . Glaucoma    SUSPECT  . Hiatal hernia   . Hyperlipidemia    LDL goal = < 100  . Hypothyroidism   . MVP (mitral valve prolapse)    documented on 2 D ECHO    Past Surgical History:  Procedure Laterality Date  . APPENDECTOMY     with TAH  . CARDIAC CATHETERIZATION  1989   negative  . COLONOSCOPY  2005   Dr Olevia Perches  . COLONOSCOPY  01/2013   Dr Collene Mares  .  esophageal dilation  07/04/2011   Dr Olevia Perches  . FLEXIBLE SIGMOIDOSCOPY  1999    Dr Olevia Perches  . KNEE ARTHROSCOPY  2001, 2005   Dr Percell Miller   bilat  . TOTAL ABDOMINAL HYSTERECTOMY     for Endometriosis; no BSO, Dr Colin Ina  . UPPER GI ENDOSCOPY  01/2013   Dr Collene Mares    Social History   Socioeconomic History  . Marital status: Married    Spouse name: Jeneen Rinks  . Number of children: Y  . Years of education: Not on file  . Highest education level: Not on file  Occupational History  . Occupation: retired from [employer]  . Financial resource strain: Not hard at all  . Food insecurity:    Worry: Never true    Inability: Never true  . Transportation needs:    Medical: No    Non-medical: No  Tobacco Use  . Smoking status: Never  Smoker  . Smokeless tobacco: Never Used  Substance and Sexual Activity  . Alcohol use: Yes    Comment:  rarely  . Drug use: No  . Sexual activity: Yes  Lifestyle  . Physical activity:    Days per week: 5 days    Minutes per session: 50 min  . Stress: Not at all  Relationships  . Social connections:    Talks on phone: More than three times a week    Gets together: More than three times a week    Attends religious service: More than 4 times per year    Active member of club or organization: Yes    Attends meetings of clubs or organizations: More than 4 times per year    Relationship status: Married  Other Topics Concern  . Not on file  Social History Narrative  . Not on file    Family History  Problem Relation Age of Onset  . Lung cancer Father        Asbestos with mesothelioma  . Heart disease Paternal Grandmother        ? etiology  . Diabetes Paternal Grandmother   . Diabetes Brother   . Uterine cancer Paternal Aunt   . Stroke Paternal Grandfather        > 50  . Heart attack Brother 77  . Allergies Sister   . Aneurysm Mother        congenital  . Stroke Brother        two strokes  . Barrett's esophagus Brother     Review of Systems See HPI    Objective:   Vitals:   07/18/17 0919  BP: 138/76  Pulse: 64  Resp: 16  Temp: 97.9 F (36.6 C)  SpO2: 96%   BP Readings from Last 3 Encounters:  07/18/17 138/76  07/02/17 124/78  07/02/17 124/78   Wt Readings from Last 3 Encounters:  07/18/17 149 lb (67.6 kg)  07/02/17 150 lb (68 kg)  07/02/17 150 lb (68 kg)   Body mass index is 24.05 kg/m.   Physical Exam  Constitutional: She appears well-developed and well-nourished. No distress.  HENT:  Head: Normocephalic and atraumatic.  Mouth/Throat: Oropharynx is clear and moist.  Pulmonary/Chest: Effort normal.  Musculoskeletal: She exhibits no edema.  Skin: Skin is warm and dry. Rash (Mildly erythematous, dry rash bilateral orbits, neck, and the outer edges  of her mouth and her left antecubital region.  No blisters, areas of excoriation, open wounds or discharge.  No swelling) noted. She is not diaphoretic.  Assessment & Plan:    See Problem List for Assessment and Plan of chronic medical problems.

## 2017-07-18 NOTE — Assessment & Plan Note (Signed)
Likely allergic in nature-possibly related to chlorine from the pool Rash did resolve well with oral prednisone Given the distribution and the difficulty using topical steroids around the eyes we will do oral steroids again Prednisone taper Benadryl has helped only slightly with her itch and she is not sleeping well Hydroxyzine 25 mg every 8 hours as needed for itching Call if no improvement

## 2017-07-18 NOTE — Patient Instructions (Signed)
Take the steroids as prescribed.  Take the hydroxyzine (anti-itch) medication as needed - this may make you drowsy.    Think about seeing an allergist.

## 2017-07-22 ENCOUNTER — Telehealth: Payer: Self-pay | Admitting: Emergency Medicine

## 2017-07-22 NOTE — Telephone Encounter (Signed)
PA completed for hydroxyzine. Awaiting response.

## 2017-07-23 NOTE — Telephone Encounter (Signed)
Spoke with Pharmacy to advise PA has been approved through 01/21/18

## 2017-08-03 ENCOUNTER — Other Ambulatory Visit: Payer: Self-pay | Admitting: Internal Medicine

## 2017-08-09 ENCOUNTER — Ambulatory Visit: Payer: PPO | Admitting: Internal Medicine

## 2017-08-09 DIAGNOSIS — H01134 Eczematous dermatitis of left upper eyelid: Secondary | ICD-10-CM | POA: Diagnosis not present

## 2017-08-09 DIAGNOSIS — H01131 Eczematous dermatitis of right upper eyelid: Secondary | ICD-10-CM | POA: Diagnosis not present

## 2017-08-09 DIAGNOSIS — H2513 Age-related nuclear cataract, bilateral: Secondary | ICD-10-CM | POA: Diagnosis not present

## 2017-08-09 DIAGNOSIS — H10413 Chronic giant papillary conjunctivitis, bilateral: Secondary | ICD-10-CM | POA: Diagnosis not present

## 2017-08-09 DIAGNOSIS — H04123 Dry eye syndrome of bilateral lacrimal glands: Secondary | ICD-10-CM | POA: Diagnosis not present

## 2017-08-09 DIAGNOSIS — H40013 Open angle with borderline findings, low risk, bilateral: Secondary | ICD-10-CM | POA: Diagnosis not present

## 2017-09-04 DIAGNOSIS — M5417 Radiculopathy, lumbosacral region: Secondary | ICD-10-CM | POA: Diagnosis not present

## 2017-09-04 DIAGNOSIS — I10 Essential (primary) hypertension: Secondary | ICD-10-CM | POA: Diagnosis not present

## 2017-09-09 ENCOUNTER — Other Ambulatory Visit: Payer: Self-pay | Admitting: Neurosurgery

## 2017-09-09 DIAGNOSIS — M5417 Radiculopathy, lumbosacral region: Secondary | ICD-10-CM

## 2017-09-15 ENCOUNTER — Ambulatory Visit
Admission: RE | Admit: 2017-09-15 | Discharge: 2017-09-15 | Disposition: A | Discharge status: Home / Self Care | Payer: PPO | Source: Ambulatory Visit | Attending: Neurosurgery | Admitting: Neurosurgery

## 2017-09-15 DIAGNOSIS — M48061 Spinal stenosis, lumbar region without neurogenic claudication: Secondary | ICD-10-CM | POA: Diagnosis not present

## 2017-09-15 DIAGNOSIS — M5417 Radiculopathy, lumbosacral region: Secondary | ICD-10-CM

## 2017-09-24 DIAGNOSIS — M5417 Radiculopathy, lumbosacral region: Secondary | ICD-10-CM | POA: Diagnosis not present

## 2017-09-24 DIAGNOSIS — M4310 Spondylolisthesis, site unspecified: Secondary | ICD-10-CM | POA: Diagnosis not present

## 2017-10-17 DIAGNOSIS — H10413 Chronic giant papillary conjunctivitis, bilateral: Secondary | ICD-10-CM | POA: Diagnosis not present

## 2017-10-17 DIAGNOSIS — H40013 Open angle with borderline findings, low risk, bilateral: Secondary | ICD-10-CM | POA: Diagnosis not present

## 2017-10-17 DIAGNOSIS — H2513 Age-related nuclear cataract, bilateral: Secondary | ICD-10-CM | POA: Diagnosis not present

## 2017-10-17 DIAGNOSIS — H04123 Dry eye syndrome of bilateral lacrimal glands: Secondary | ICD-10-CM | POA: Diagnosis not present

## 2017-10-22 ENCOUNTER — Other Ambulatory Visit: Payer: Self-pay | Admitting: Internal Medicine

## 2017-10-22 DIAGNOSIS — R0602 Shortness of breath: Secondary | ICD-10-CM

## 2017-10-22 DIAGNOSIS — R0609 Other forms of dyspnea: Secondary | ICD-10-CM

## 2017-10-31 DIAGNOSIS — M4310 Spondylolisthesis, site unspecified: Secondary | ICD-10-CM | POA: Diagnosis not present

## 2017-10-31 DIAGNOSIS — M5417 Radiculopathy, lumbosacral region: Secondary | ICD-10-CM | POA: Diagnosis not present

## 2017-11-18 DIAGNOSIS — H2511 Age-related nuclear cataract, right eye: Secondary | ICD-10-CM | POA: Diagnosis not present

## 2017-11-28 DIAGNOSIS — H2512 Age-related nuclear cataract, left eye: Secondary | ICD-10-CM | POA: Diagnosis not present

## 2017-12-04 ENCOUNTER — Encounter: Payer: Self-pay | Admitting: Emergency Medicine

## 2017-12-04 ENCOUNTER — Ambulatory Visit (INDEPENDENT_AMBULATORY_CARE_PROVIDER_SITE_OTHER): Payer: PPO | Admitting: Emergency Medicine

## 2017-12-04 ENCOUNTER — Ambulatory Visit (INDEPENDENT_AMBULATORY_CARE_PROVIDER_SITE_OTHER)
Admission: RE | Admit: 2017-12-04 | Discharge: 2017-12-04 | Disposition: A | Discharge status: Home / Self Care | Payer: PPO | Source: Ambulatory Visit | Attending: Emergency Medicine | Admitting: Emergency Medicine

## 2017-12-04 VITALS — BP 104/66 | HR 88 | Ht 66.0 in | Wt 154.0 lb

## 2017-12-04 DIAGNOSIS — R0600 Dyspnea, unspecified: Secondary | ICD-10-CM

## 2017-12-04 DIAGNOSIS — R05 Cough: Secondary | ICD-10-CM

## 2017-12-04 DIAGNOSIS — R059 Cough, unspecified: Secondary | ICD-10-CM | POA: Insufficient documentation

## 2017-12-04 DIAGNOSIS — G2581 Restless legs syndrome: Secondary | ICD-10-CM | POA: Diagnosis not present

## 2017-12-04 NOTE — Assessment & Plan Note (Signed)
Suspect at least some contribution from allergic rhinitis which is possibly undertreated.  We talked about adding a nasal steroid to her Allegra.  Also continue treatment for her GERD.

## 2017-12-04 NOTE — Assessment & Plan Note (Signed)
Adequately controlled on Requip

## 2017-12-04 NOTE — Assessment & Plan Note (Signed)
Slowly progressive and now interrupting her exercise routine.  Unclear as to whether she has obstructive lung disease but she was tried empirically on bronchodilators several years ago.  She had a reassuring exercise stress test in 2016.  As noted above there was some question of an alveolar inflammatory process on CT scan of the chest in 2014, no clear cause identified, no current evidence for a parenchymal process at least on exam.  We need to start with pulmonary function testing and repeat chest x-ray.  If no evidence of intrinsic lung disease and we can turn our attention to possible cardiac cause.  This may include referral to cardiology or possibly a CPST.

## 2017-12-04 NOTE — Progress Notes (Signed)
Subjective:    Patient ID: Elizabeth Espinoza, female    DOB: 1941/01/07, 77 y.o.   MRN: [id number]  HPI Elizabeth Espinoza is a 77 year old never smoker with a history of allergic rhinitis, GERD, hypothyroidism, MVP.  She is been seen in our office in the past by Dr. Gwenette Greet for restless leg syndrome-she still treated for this with Requip. On allegra, nexium prn.   She is referred today for dyspnea.  She reports that she has had an exercise routine where she walked w her husband 2 miles, exercises at the [facility], for several years. She has noticed more SOB, especially with mild hills. Sometimes now has to stop briefly to complete the walk. Occasional chest tightness, no wheeze. No overt CP. She reports daily cough, often in the am, sometimes clear mucous. She is on allegra. She had a walking stress test 09/2014 > reassuring ECG w exercise. Weight has been stable. Her last TSH was 06/25/17, 0.73.   Review of her notes shows hx possible GGI on prior CT 2014.    Review of Systems  Constitutional: Negative for fever and unexpected weight change.  HENT: Positive for congestion and sinus pressure. Negative for dental problem, ear pain, nosebleeds, postnasal drip, rhinorrhea, sneezing, sore throat and trouble swallowing.   Eyes: Negative for redness and itching.  Respiratory: Positive for shortness of breath. Negative for cough, chest tightness and wheezing.   Cardiovascular: Negative for palpitations and leg swelling.  Gastrointestinal: Negative for nausea and vomiting.  Genitourinary: Negative for dysuria.  Musculoskeletal: Negative for joint swelling.  Skin: Negative for rash.  Neurological: Negative for headaches.  Hematological: Does not bruise/bleed easily.  Psychiatric/Behavioral: Negative for dysphoric mood. The patient is not nervous/anxious.     Past Medical History:  Diagnosis Date  . Allergic rhinitis   . GERD (gastroesophageal reflux disease)   . Glaucoma    SUSPECT  . Hiatal hernia   .  Hyperlipidemia    LDL goal = < 100  . Hypothyroidism   . MVP (mitral valve prolapse)    documented on 2 D ECHO     Family History  Problem Relation Age of Onset  . Lung cancer Father        Asbestos with mesothelioma  . Heart disease Paternal Grandmother        ? etiology  . Diabetes Paternal Grandmother   . Diabetes Brother   . Uterine cancer Paternal Aunt   . Stroke Paternal Grandfather        > 69  . Heart attack Brother 58  . Allergies Sister   . Aneurysm Mother        congenital  . Stroke Brother        two strokes  . Barrett's esophagus Brother      Social History   Socioeconomic History  . Marital status: Married    Spouse name: Jeneen Rinks  . Number of children: Y  . Years of education: Not on file  . Highest education level: Not on file  Occupational History  . Occupation: retired from [employer]  . Financial resource strain: Not hard at all  . Food insecurity:    Worry: Never true    Inability: Never true  . Transportation needs:    Medical: No    Non-medical: No  Tobacco Use  . Smoking status: Never Smoker  . Smokeless tobacco: Never Used  Substance and Sexual Activity  . Alcohol use: Yes    Comment:  rarely  .  Drug use: No  . Sexual activity: Yes  Lifestyle  . Physical activity:    Days per week: 5 days    Minutes per session: 50 min  . Stress: Not at all  Relationships  . Social connections:    Talks on phone: More than three times a week    Gets together: More than three times a week    Attends religious service: More than 4 times per year    Active member of club or organization: Yes    Attends meetings of clubs or organizations: More than 4 times per year    Relationship status: Married  . Intimate partner violence:    Fear of current or ex partner: No    Emotionally abused: No    Physically abused: No    Forced sexual activity: No  Other Topics Concern  . Not on file  Social History Narrative  . Not on file      Allergies  Allergen Reactions  . Caffeine     palpitations  . Chlorpheniramine-Pseudoeph     palpitations  . Maxifed     palpitations     Outpatient Medications Prior to Visit  Medication Sig Dispense Refill  . Biotin 1000 MCG tablet Take 1,000 mcg by mouth daily.    . Cholecalciferol (VITAMIN D-3 PO) Take by mouth daily.    Marland Kitchen esomeprazole (NEXIUM) 20 MG capsule Take 1 capsule (20 mg total) by mouth daily at 12 noon. (Patient taking differently: Take 20 mg by mouth daily as needed. )    . Fexofenadine HCl (ALLEGRA PO) Take 1 tablet by mouth daily.     Marland Kitchen levothyroxine (SYNTHROID, LEVOTHROID) 88 MCG tablet Taking one tablet in am daily 90 tablet 3  . MAGNESIUM CARBONATE PO Take 1 tablet by mouth daily.     . pravastatin (PRAVACHOL) 40 MG tablet TAKE 1 TABLET BY MOUTH EVERY DAY 90 tablet 2  . Probiotic Product (PROBIOTIC DAILY PO) Take 1 capsule by mouth daily as needed. ULTRA FLORA     . rOPINIRole (REQUIP) 0.5 MG tablet TAKE 2 TABLETS BY MOUTH  AFTER DINNER EACH NIGHT.  --Office visit needed for further refills (Patient taking differently: TAKE 1 TABLET BY MOUTH  AFTER DINNER EACH NIGHT.  --Office visit needed for further refills) 180 tablet 0  . hydrOXYzine (ATARAX/VISTARIL) 25 MG tablet Take 1 tablet (25 mg total) by mouth 3 (three) times daily as needed for itching. (Patient not taking: Reported on 12/04/2017) 10 tablet 0   No facility-administered medications prior to visit.         Objective:   Physical Exam  Vitals:   12/04/17 1150  BP: 104/66  Pulse: 88  SpO2: 99%  Weight: 154 lb (69.9 kg)  Height: _0  (1.676 m)   Gen: Pleasant, well-nourished, in no distress,  normal affect  ENT: No lesions,  mouth clear,  oropharynx clear, no postnasal drip  Neck: No JVD, no stridor  Lungs: No use of accessory muscles, clear, no wheeze, no crackles  Cardiovascular: Regular, History of MVP but I do not hear a murmur.  Musculoskeletal: No deformities, no cyanosis or  clubbing  Neuro: alert, non focal  Skin: Warm, no lesions or rash     12/12/12 --  COMPARISON:  Chest x-ray 12/09/2012.  FINDINGS: The chest wall is unremarkable. No breast masses, supraclavicular or axillary lymphadenopathy. Small scattered nodes are noted. The bony thorax is intact. No destructive bone lesions or spinal canal compromise. Moderate osteoporosis and exaggerated  kyphosis.  The heart is normal in size. No pericardial effusion. Small scattered mediastinal and hilar lymph nodes but no adenopathy or mass. The aorta is normal in caliber. No significant atherosclerotic calcifications. The esophagus is grossly normal. Coronary artery calcifications are noted.  Examination of the lung parenchyma demonstrates patchy peripheral ground-glass opacities in both lungs. This could reflect partial airspace filling process such as edema, infection or alveolitis. Scarring changes are also noted, particularly in the apices. I do not see a worrisome mass. No bronchiectasis or interstitial lung disease. No pleural effusion.  The upper abdomen is unremarkable.  IMPRESSION: Patchy ill-defined ground-glass opacities in both lungs likely reflecting a partial airspace filling process such as infection or inflammation/alveolitis.  Apical scarring type changes likely account for the abnormality on the chest x-ray. I do not see a worrisome discrete pulmonary mass.  Recommend a followup chest CT without contrast in 3-4 months to re-evaluate.     Assessment & Plan:  Dyspnea on exertion Slowly progressive and now interrupting her exercise routine.  Unclear as to whether she has obstructive lung disease but she was tried empirically on bronchodilators several years ago.  She had a reassuring exercise stress test in 2016.  As noted above there was some question of an alveolar inflammatory process on CT scan of the chest in 2014, no clear cause identified, no current evidence for a  parenchymal process at least on exam.  We need to start with pulmonary function testing and repeat chest x-ray.  If no evidence of intrinsic lung disease and we can turn our attention to possible cardiac cause.  This may include referral to cardiology or possibly a CPST.  Cough Suspect at least some contribution from allergic rhinitis which is possibly undertreated.  We talked about adding a nasal steroid to her Allegra.  Also continue treatment for her GERD.  RLS (restless legs syndrome) Adequately controlled on Requip  Baltazar Apo, MD, PhD 12/04/2017, 12:19 PM [facility] [phone] or if no answer [phone]

## 2017-12-04 NOTE — Patient Instructions (Addendum)
We will perform a CXR today.  We will perform pulmonary function testing to assess your lung function.  Continue allegra once daily You may benefit from trying fluticasone nasal spray, 2 sprays each nostril once a day.  Please continue Requip as you have been taking it Follow with Dr Lamonte Sakai in about 3 weeks with full PFT same day.

## 2017-12-09 DIAGNOSIS — H2512 Age-related nuclear cataract, left eye: Secondary | ICD-10-CM | POA: Diagnosis not present

## 2017-12-09 DIAGNOSIS — H21562 Pupillary abnormality, left eye: Secondary | ICD-10-CM | POA: Diagnosis not present

## 2017-12-30 ENCOUNTER — Telehealth: Payer: Self-pay | Admitting: Emergency Medicine

## 2017-12-30 NOTE — Telephone Encounter (Signed)
LMOM TCB x1

## 2017-12-30 NOTE — Telephone Encounter (Signed)
Spoke with pt, she states she wanted to move her appt further up in Jan because she just had cataract surgery and her doctor told her to wait until January. I rescheduled patient for 01/29/2017 with both PFT and OV. Nothing further is needed.

## 2017-12-30 NOTE — Telephone Encounter (Signed)
Patient returned phone call; pt contact # [phone]

## 2018-01-02 ENCOUNTER — Encounter: Payer: Self-pay | Admitting: Internal Medicine

## 2018-01-02 ENCOUNTER — Ambulatory Visit (INDEPENDENT_AMBULATORY_CARE_PROVIDER_SITE_OTHER): Payer: PPO | Admitting: Internal Medicine

## 2018-01-02 VITALS — BP 140/72 | HR 59 | Temp 98.3°F | Resp 16 | Ht 66.0 in | Wt 154.0 lb

## 2018-01-02 DIAGNOSIS — J0100 Acute maxillary sinusitis, unspecified: Secondary | ICD-10-CM | POA: Diagnosis not present

## 2018-01-02 DIAGNOSIS — J019 Acute sinusitis, unspecified: Secondary | ICD-10-CM | POA: Insufficient documentation

## 2018-01-02 MED ORDER — SYNTHROID 88 MCG PO TABS: 88.0000 ug | ORAL_TABLET | Freq: Every day | ORAL | 1 refills | Status: DC

## 2018-01-02 MED ORDER — AMOXICILLIN-POT CLAVULANATE 875-125 MG PO TABS: 1.0000 | ORAL_TABLET | Freq: Two times a day (BID) | ORAL | 0 refills | Status: DC

## 2018-01-02 NOTE — Progress Notes (Signed)
Subjective:    Patient ID: Elizabeth Espinoza, female    DOB: [birth year], 77 y.o.   MRN: [id number]  HPI She is here for an acute visit for cold symptoms.   Her symptoms started 3 days ago  She is experiencing chills, congestion, ear pain, sinus pain, sore throat with pain with swallowing, cough, myalgias, headaches and lightheadedness.  She denies fever, sob, wheeze, nausea and diarrhea.    She has tried taking tylenol    Medications and allergies reviewed with patient and updated if appropriate.  Patient Active Problem List   Diagnosis Date Noted  . Cough 12/04/2017  . Rash and nonspecific skin eruption 07/30/2016  . Prediabetes 02/24/2015  . Dyspnea on exertion 09/29/2014  . Abnormal chest x-ray 12/11/2012  . Barrett's esophagus 12/11/2012  . RLS (restless legs syndrome) 04/13/2011  . 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 ARTHRALGIA 09/01/2008  . HIATAL HERNIA 07/31/2007  . MITRAL VALVE PROLAPSE, HX OF 07/31/2007  . ALLERGIC RHINITIS WITH CONJUNCTIVITIS 06/19/2007  . GASTROESOPHAGEAL REFLUX DISEASE, CHRONIC 06/19/2007  . Hypothyroidism 02/07/2007  . HYPERLIPIDEMIA 02/07/2007    Current Outpatient Medications on File Prior to Visit  Medication Sig Dispense Refill  . Biotin 1000 MCG tablet Take 1,000 mcg by mouth daily.    . Cholecalciferol (VITAMIN D-3 PO) Take by mouth daily.    Marland Kitchen esomeprazole (NEXIUM) 20 MG capsule Take 1 capsule (20 mg total) by mouth daily at 12 noon. (Patient taking differently: Take 20 mg by mouth daily as needed. )    . Fexofenadine HCl (ALLEGRA PO) Take 1 tablet by mouth daily.     Marland Kitchen levothyroxine (SYNTHROID, LEVOTHROID) 88 MCG tablet Taking one tablet in am daily 90 tablet 3  . MAGNESIUM CARBONATE PO Take 1 tablet by mouth daily.     . pravastatin (PRAVACHOL) 40 MG tablet TAKE 1 TABLET BY MOUTH EVERY DAY 90 tablet 2  . Probiotic Product (PROBIOTIC DAILY PO) Take 1 capsule by mouth daily as needed. ULTRA FLORA       . rOPINIRole (REQUIP) 0.5 MG tablet TAKE 2 TABLETS BY MOUTH  AFTER DINNER EACH NIGHT.  --Office visit needed for further refills (Patient taking differently: TAKE 1 TABLET BY MOUTH  AFTER DINNER EACH NIGHT.  --Office visit needed for further refills) 180 tablet 0  . [DISCONTINUED] loratadine (CLARITIN) 10 MG tablet Take 10 mg by mouth daily as needed.       No current facility-administered medications on file prior to visit.     Past Medical History:  Diagnosis Date  . Allergic rhinitis   . GERD (gastroesophageal reflux disease)   . Glaucoma    SUSPECT  . Hiatal hernia   . Hyperlipidemia    LDL goal = < 100  . Hypothyroidism   . MVP (mitral valve prolapse)    documented on 2 D ECHO    Past Surgical History:  Procedure Laterality Date  . APPENDECTOMY     with TAH  . CARDIAC CATHETERIZATION  1989   negative  . COLONOSCOPY  2005   Dr Olevia Perches  . COLONOSCOPY  01/2013   Dr Collene Mares  . esophageal dilation  07/04/2011   Dr Olevia Perches  . FLEXIBLE SIGMOIDOSCOPY  1999    Dr Olevia Perches  . KNEE ARTHROSCOPY  2001, 2005   Dr Percell Miller   bilat  . TOTAL ABDOMINAL HYSTERECTOMY     for Endometriosis; no BSO, Dr Colin Ina  . UPPER GI ENDOSCOPY  01/2013   Dr  Mann    Social History   Socioeconomic History  . Marital status: Married    Spouse name: Jeneen Rinks  . Number of children: Y  . Years of education: Not on file  . Highest education level: Not on file  Occupational History  . Occupation: retired from [employer]  . Financial resource strain: Not hard at all  . Food insecurity:    Worry: Never true    Inability: Never true  . Transportation needs:    Medical: No    Non-medical: No  Tobacco Use  . Smoking status: Never Smoker  . Smokeless tobacco: Never Used  Substance and Sexual Activity  . Alcohol use: Yes    Comment:  rarely  . Drug use: No  . Sexual activity: Yes  Lifestyle  . Physical activity:    Days per week: 5 days    Minutes per session: 50 min  . Stress: Not  at all  Relationships  . Social connections:    Talks on phone: More than three times a week    Gets together: More than three times a week    Attends religious service: More than 4 times per year    Active member of club or organization: Yes    Attends meetings of clubs or organizations: More than 4 times per year    Relationship status: Married  Other Topics Concern  . Not on file  Social History Narrative  . Not on file    Family History  Problem Relation Age of Onset  . Lung cancer Father        Asbestos with mesothelioma  . Heart disease Paternal Grandmother        ? etiology  . Diabetes Paternal Grandmother   . Diabetes Brother   . Uterine cancer Paternal Aunt   . Stroke Paternal Grandfather        > 47  . Heart attack Brother 23  . Allergies Sister   . Aneurysm Mother        congenital  . Stroke Brother        two strokes  . Barrett's esophagus Brother     Review of Systems  Constitutional: Positive for chills. Negative for fever.  HENT: Positive for congestion, ear pain, sinus pain, sore throat and trouble swallowing (pain with swallowing).   Respiratory: Positive for cough (mild, discolored - minimal amounts). Negative for shortness of breath and wheezing.   Gastrointestinal: Negative for diarrhea and nausea.  Musculoskeletal: Positive for myalgias.  Neurological: Positive for light-headedness and headaches.       Objective:   Vitals:   01/02/18 1125  BP: 140/72  Pulse: (!) 59  Resp: 16  Temp: 98.3 F (36.8 C)  SpO2: 98%   BP Readings from Last 3 Encounters:  01/02/18 140/72  12/04/17 104/66  07/18/17 138/76   Wt Readings from Last 3 Encounters:  01/02/18 154 lb (69.9 kg)  12/04/17 154 lb (69.9 kg)  07/18/17 149 lb (67.6 kg)   Body mass index is 24.86 kg/m.   Physical Exam    GENERAL APPEARANCE: Appears mildly ill, NAD EYES: conjunctiva clear, no icterus HEENT: bilateral tympanic membranes and ear canals normal, oropharynx with mild  erythema, sinus tenderness, no thyromegaly, trachea midline, no cervical or supraclavicular lymphadenopathy LUNGS: Clear to auscultation without wheeze or crackles, unlabored breathing, good air entry bilaterally CARDIOVASCULAR: Normal S1,S2 without murmurs, no edema SKIN: Warm, dry      Assessment & Plan:  See Problem List for Assessment and Plan of chronic medical problems.

## 2018-01-02 NOTE — Assessment & Plan Note (Signed)
Likely bacterial  Start augmentin Aleve, tylenol otc cold medications Rest, fluid Call if no improvement

## 2018-01-02 NOTE — Patient Instructions (Addendum)
Take the antibiotic as prescribed - complete the entire course.    Continue over the counter cold medication, advil and tylenol.  Increase your fluids and rest.    Call if no improvement       Sinusitis, Adult Sinusitis is soreness and inflammation of your sinuses. Sinuses are hollow spaces in the bones around your face. Your sinuses are located:  Around your eyes.  In the middle of your forehead.  Behind your nose.  In your cheekbones.  Your sinuses and nasal passages are lined with a stringy fluid (mucus). Mucus normally drains out of your sinuses. When your nasal tissues become inflamed or swollen, the mucus can become trapped or blocked so air cannot flow through your sinuses. This allows bacteria, viruses, and funguses to grow, which leads to infection. Sinusitis can develop quickly and last for 7?10 days (acute) or for more than 12 weeks (chronic). Sinusitis often develops after a cold. What are the causes? This condition is caused by anything that creates swelling in the sinuses or stops mucus from draining, including:  Allergies.  Asthma.  Bacterial or viral infection.  Abnormally shaped bones between the nasal passages.  Nasal growths that contain mucus (nasal polyps).  Narrow sinus openings.  Pollutants, such as chemicals or irritants in the air.  A foreign object stuck in the nose.  A fungal infection. This is rare.  What increases the risk? The following factors may make you more likely to develop this condition:  Having allergies or asthma.  Having had a recent cold or respiratory tract infection.  Having structural deformities or blockages in your nose or sinuses.  Having a weak immune system.  Doing a lot of swimming or diving.  Overusing nasal sprays.  Smoking.  What are the signs or symptoms? The main symptoms of this condition are pain and a feeling of pressure around the affected sinuses. Other symptoms include:  Upper  toothache.  Earache.  Headache.  Bad breath.  Decreased sense of smell and taste.  A cough that may get worse at night.  Fatigue.  Fever.  Thick drainage from your nose. The drainage is often green and it may contain pus (purulent).  Stuffy nose or congestion.  Postnasal drip. This is when extra mucus collects in the throat or back of the nose.  Swelling and warmth over the affected sinuses.  Sore throat.  Sensitivity to light.  How is this diagnosed? This condition is diagnosed based on symptoms, a medical history, and a physical exam. To find out if your condition is acute or chronic, your health care provider may:  Look in your nose for signs of nasal polyps.  Tap over the affected sinus to check for signs of infection.  View the inside of your sinuses using an imaging device that has a light attached (endoscope).  If your health care provider suspects that you have chronic sinusitis, you may also:  Be tested for allergies.  Have a sample of mucus taken from your nose (nasal culture) and checked for bacteria.  Have a mucus sample examined to see if your sinusitis is related to an allergy.  If your sinusitis does not respond to treatment and it lasts longer than 8 weeks, you may have an MRI or CT scan to check your sinuses. These scans also help to determine how severe your infection is. In rare cases, a bone biopsy may be done to rule out more serious types of fungal sinus disease. How is this treated? Treatment  for sinusitis depends on the cause and whether your condition is chronic or acute. If a virus is causing your sinusitis, your symptoms will go away on their own within 10 days. You may be given medicines to relieve your symptoms, including:  Topical nasal decongestants. They shrink swollen nasal passages and let mucus drain from your sinuses.  Antihistamines. These drugs block inflammation that is triggered by allergies. This can help to ease swelling in  your nose and sinuses.  Topical nasal corticosteroids. These are nasal sprays that ease inflammation and swelling in your nose and sinuses.  Nasal saline washes. These rinses can help to get rid of thick mucus in your nose.  If your condition is caused by bacteria, you will be given an antibiotic medicine. If your condition is caused by a fungus, you will be given an antifungal medicine. Surgery may be needed to correct underlying conditions, such as narrow nasal passages. Surgery may also be needed to remove polyps. Follow these instructions at home: Medicines  Take, use, or apply over-the-counter and prescription medicines only as told by your health care provider. These may include nasal sprays.  If you were prescribed an antibiotic medicine, take it as told by your health care provider. Do not stop taking the antibiotic even if you start to feel better. Hydrate and Humidify  Drink enough water to keep your urine clear or pale yellow. Staying hydrated will help to thin your mucus.  Use a cool mist humidifier to keep the humidity level in your home above 50%.  Inhale steam for 10-15 minutes, 3-4 times a day or as told by your health care provider. You can do this in the bathroom while a hot shower is running.  Limit your exposure to cool or dry air. Rest  Rest as much as possible.  Sleep with your head raised (elevated).  Make sure to get enough sleep each night. General instructions  Apply a warm, moist washcloth to your face 3-4 times a day or as told by your health care provider. This will help with discomfort.  Wash your hands often with soap and water to reduce your exposure to viruses and other germs. If soap and water are not available, use hand sanitizer.  Do not smoke. Avoid being around people who are smoking (secondhand smoke).  Keep all follow-up visits as told by your health care provider. This is important. Contact a health care provider if:  You have a  fever.  Your symptoms get worse.  Your symptoms do not improve within 10 days. Get help right away if:  You have a severe headache.  You have persistent vomiting.  You have pain or swelling around your face or eyes.  You have vision problems.  You develop confusion.  Your neck is stiff.  You have trouble breathing. This information is not intended to replace advice given to you by your health care provider. Make sure you discuss any questions you have with your health care provider. Document Released: 01/08/2005 Document Revised: 09/04/2015 Document Reviewed: 11/03/2014 Elsevier Interactive Patient Education  [employer].

## 2018-01-09 ENCOUNTER — Ambulatory Visit: Payer: PPO | Admitting: Emergency Medicine

## 2018-01-29 ENCOUNTER — Ambulatory Visit (INDEPENDENT_AMBULATORY_CARE_PROVIDER_SITE_OTHER): Payer: Medicare Other | Admitting: Emergency Medicine

## 2018-01-29 ENCOUNTER — Encounter: Payer: Self-pay | Admitting: Emergency Medicine

## 2018-01-29 DIAGNOSIS — R0600 Dyspnea, unspecified: Secondary | ICD-10-CM | POA: Diagnosis not present

## 2018-01-29 DIAGNOSIS — J849 Interstitial pulmonary disease, unspecified: Secondary | ICD-10-CM

## 2018-01-29 DIAGNOSIS — R05 Cough: Secondary | ICD-10-CM | POA: Diagnosis not present

## 2018-01-29 DIAGNOSIS — R053 Chronic cough: Secondary | ICD-10-CM

## 2018-01-29 DIAGNOSIS — R0609 Other forms of dyspnea: Secondary | ICD-10-CM

## 2018-01-29 LAB — PULMONARY FUNCTION TEST
DL/VA % pred: 68 %
DL/VA: 3.46 ml/min/mmHg/L
DLCO unc % pred: 54 %
DLCO unc: 14.8 ml/min/mmHg
FEF 25-75 POST: 2.44 L/s
FEF 25-75 PRE: 2.44 L/s
FEF2575-%Change-Post: 0 %
FEF2575-%PRED-PRE: 141 %
FEF2575-%Pred-Post: 141 %
FEV1-%CHANGE-POST: 0 %
FEV1-%PRED-PRE: 105 %
FEV1-%Pred-Post: 105 %
FEV1-POST: 2.39 L
FEV1-Pre: 2.4 L
FEV1FVC-%Change-Post: 2 %
FEV1FVC-%PRED-PRE: 108 %
FEV6-%CHANGE-POST: -2 %
FEV6-%PRED-POST: 99 %
FEV6-%Pred-Pre: 102 %
FEV6-Post: 2.88 L
FEV6-Pre: 2.95 L
FEV6FVC-%CHANGE-POST: 0 %
FEV6FVC-%Pred-Post: 104 %
FEV6FVC-%Pred-Pre: 104 %
FVC-%CHANGE-POST: -2 %
FVC-%PRED-POST: 95 %
FVC-%Pred-Pre: 98 %
FVC-Post: 2.89 L
FVC-Pre: 2.97 L
POST FEV1/FVC RATIO: 83 %
Post FEV6/FVC ratio: 100 %
Pre FEV1/FVC ratio: 81 %
Pre FEV6/FVC Ratio: 99 %

## 2018-01-29 NOTE — Progress Notes (Signed)
PFT completed today.  

## 2018-01-29 NOTE — Progress Notes (Signed)
Subjective:    Patient ID: Elizabeth Espinoza, female    DOB: 04-10-1940, 78 y.o.   MRN: [id number]  HPI [name] is a 78 year old never smoker with a history of allergic rhinitis, GERD, hypothyroidism, MVP.  She is been seen in our office in the past by Dr. Gwenette Greet for restless leg syndrome-she still treated for this with Requip. On allegra, nexium prn.   She is referred today for dyspnea.  She reports that she has had an exercise routine where she walked w her husband 2 miles, exercises at the [facility], for several years. She has noticed more SOB, especially with mild hills. Sometimes now has to stop briefly to complete the walk. Occasional chest tightness, no wheeze. No overt CP. She reports daily cough, often in the am, sometimes clear mucous. She is on allegra. She had a walking stress test 09/2014 > reassuring ECG w exercise. Weight has been stable. Her last TSH was 06/25/17, 0.73.   Review of her notes shows hx possible GGI on prior CT 2014.   ROV 01/29/18 --this is a follow-up visit for exertional shortness of breath.  As above patient is 33, never smoker without much past medical history beyond mitral valve prolapse.  She has a CT chest from 2014 that question some pneumonitis.  I performed a chest x-ray 12/04/2017 that I reviewed, it is reassuring without any evidence of cardiopulmonary disease.  She underwent pulmonary function testing today Which I reviewed, shows normal airflows without a bronchodilator response, restricted lung volumes and a decreased diffusion capacity that does not correct when adjusted for her alveolar volume.  She has not been walking as much. Her breathing is about the same, still has to slow down after a hill. She has globus sensation, still on nexium, allegra. She started flonase last time.    Review of Systems  Constitutional: Negative for fever and unexpected weight change.  HENT: Positive for congestion and sinus pressure. Negative for dental problem, ear pain,  nosebleeds, postnasal drip, rhinorrhea, sneezing, sore throat and trouble swallowing.   Eyes: Negative for redness and itching.  Respiratory: Positive for shortness of breath. Negative for cough, chest tightness and wheezing.   Cardiovascular: Negative for palpitations and leg swelling.  Gastrointestinal: Negative for nausea and vomiting.  Genitourinary: Negative for dysuria.  Musculoskeletal: Negative for joint swelling.  Skin: Negative for rash.  Neurological: Negative for headaches.  Hematological: Does not bruise/bleed easily.  Psychiatric/Behavioral: Negative for dysphoric mood. The patient is not nervous/anxious.     Past Medical History:  Diagnosis Date  . Allergic rhinitis   . GERD (gastroesophageal reflux disease)   . Glaucoma    SUSPECT  . Hiatal hernia   . Hyperlipidemia    LDL goal = < 100  . Hypothyroidism   . MVP (mitral valve prolapse)    documented on 2 D ECHO     Family History  Problem Relation Age of Onset  . Lung cancer Father        Asbestos with mesothelioma  . Heart disease Paternal Grandmother        ? etiology  . Diabetes Paternal Grandmother   . Diabetes Brother   . Uterine cancer Paternal Aunt   . Stroke Paternal Grandfather        > 52  . Heart attack Brother 2  . Allergies Sister   . Aneurysm Mother        congenital  . Stroke Brother        two strokes  .  Barrett's esophagus Brother      Social History   Socioeconomic History  . Marital status: Married    Spouse name: Jeneen Rinks  . Number of children: Y  . Years of education: Not on file  . Highest education level: Not on file  Occupational History  . Occupation: retired from [employer]  . Financial resource strain: Not hard at all  . Food insecurity:    Worry: Never true    Inability: Never true  . Transportation needs:    Medical: No    Non-medical: No  Tobacco Use  . Smoking status: Never Smoker  . Smokeless tobacco: Never Used  Substance and Sexual  Activity  . Alcohol use: Yes    Comment:  rarely  . Drug use: No  . Sexual activity: Yes  Lifestyle  . Physical activity:    Days per week: 5 days    Minutes per session: 50 min  . Stress: Not at all  Relationships  . Social connections:    Talks on phone: More than three times a week    Gets together: More than three times a week    Attends religious service: More than 4 times per year    Active member of club or organization: Yes    Attends meetings of clubs or organizations: More than 4 times per year    Relationship status: Married  . Intimate partner violence:    Fear of current or ex partner: No    Emotionally abused: No    Physically abused: No    Forced sexual activity: No  Other Topics Concern  . Not on file  Social History Narrative  . Not on file     Allergies  Allergen Reactions  . Caffeine     palpitations  . Chlorpheniramine-Pseudoeph     palpitations  . Maxifed     palpitations     Outpatient Medications Prior to Visit  Medication Sig Dispense Refill  . Biotin 1000 MCG tablet Take 1,000 mcg by mouth daily.    . Cholecalciferol (VITAMIN D-3 PO) Take by mouth daily.    Marland Kitchen esomeprazole (NEXIUM) 20 MG capsule Take 1 capsule (20 mg total) by mouth daily at 12 noon. (Patient taking differently: Take 20 mg by mouth daily as needed. )    . Fexofenadine HCl (ALLEGRA PO) Take 1 tablet by mouth daily.     Marland Kitchen MAGNESIUM CARBONATE PO Take 1 tablet by mouth daily.     . pravastatin (PRAVACHOL) 40 MG tablet TAKE 1 TABLET BY MOUTH EVERY DAY 90 tablet 2  . Probiotic Product (PROBIOTIC DAILY PO) Take 1 capsule by mouth daily as needed. ULTRA FLORA     . rOPINIRole (REQUIP) 0.5 MG tablet TAKE 2 TABLETS BY MOUTH  AFTER DINNER EACH NIGHT.  --Office visit needed for further refills (Patient taking differently: TAKE 1 TABLET BY MOUTH  AFTER DINNER EACH NIGHT.  --Office visit needed for further refills) 180 tablet 0  . SYNTHROID 88 MCG tablet Take 1 tablet (88 mcg total) by mouth  daily before breakfast. 90 tablet 1  . amoxicillin-clavulanate (AUGMENTIN) 875-125 MG tablet Take 1 tablet by mouth 2 (two) times daily. 20 tablet 0   No facility-administered medications prior to visit.         Objective:   Physical Exam  Vitals:   01/29/18 1554  BP: 116/68  Pulse: 77  SpO2: 98%  Weight: 72.1 kg  Height: _0  (1.676 m)   Gen:  Pleasant, well-nourished, in no distress,  normal affect  ENT: No lesions,  mouth clear,  oropharynx clear, no postnasal drip  Neck: No JVD, no stridor  Lungs: No use of accessory muscles, clear, no wheeze, no crackles  Cardiovascular: Regular, History of MVP, no M  Musculoskeletal: No deformities, no cyanosis or clubbing  Neuro: alert, non focal  Skin: Warm, no lesions or rash     12/12/12 --  COMPARISON:  Chest x-ray 12/09/2012.  FINDINGS: The chest wall is unremarkable. No breast masses, supraclavicular or axillary lymphadenopathy. Small scattered nodes are noted. The bony thorax is intact. No destructive bone lesions or spinal canal compromise. Moderate osteoporosis and exaggerated kyphosis.  The heart is normal in size. No pericardial effusion. Small scattered mediastinal and hilar lymph nodes but no adenopathy or mass. The aorta is normal in caliber. No significant atherosclerotic calcifications. The esophagus is grossly normal. Coronary artery calcifications are noted.  Examination of the lung parenchyma demonstrates patchy peripheral ground-glass opacities in both lungs. This could reflect partial airspace filling process such as edema, infection or alveolitis. Scarring changes are also noted, particularly in the apices. I do not see a worrisome mass. No bronchiectasis or interstitial lung disease. No pleural effusion.  The upper abdomen is unremarkable.  IMPRESSION: Patchy ill-defined ground-glass opacities in both lungs likely reflecting a partial airspace filling process such as infection  or inflammation/alveolitis.  Apical scarring type changes likely account for the abnormality on the chest x-ray. I do not see a worrisome discrete pulmonary mass.  Recommend a followup chest CT without contrast in 3-4 months to re-evaluate.     Assessment & Plan:  Dyspnea on exertion Her spirometry is reassuring without any evidence of obstruction.  Her total lung capacity and residual volume are both decreased consistent with restrictive lung disease.  Likewise her diffusion capacity is decreased.  Etiology for this is unclear, no evidence for an elevated hemidiaphragm by chest x-ray.  Must consider possible interstitial disease given her prior CT scan of the chest from 2014 even in light of her clear chest x-ray from 09/2017.  I think she needs a high-resolution CT scan of the chest to screen for ILD.  We will also perform walking oximetry today to ensure no occult desaturation.  No role here for bronchodilators.  I think it would be reasonable to perform a cardiopulmonary exercise test to screen for either a cardiac component or deconditioning.  She is agreeable and I will arrange for this.  Chronic cough Suspect contributions of GERD, allergic rhinitis.  She has less nasal congestion now that she is added fluticasone to fexofenadine but has not really changed her globus sensation or her cough pattern. She is on generic dose Nexium.  I have discussed cough hygiene, avoiding throat clearing etc.  Could consider expanding the work-up going forward, checking eosinophils, upper airway inspection etc.  Baltazar Apo, MD, PhD 01/29/2018, 4:34 PM [facility] [phone] or if no answer [phone]

## 2018-01-29 NOTE — Patient Instructions (Addendum)
We will arrange for a high resolution CT chest  We will arrange for a cardiopulmonary exercise test to look for contributors to shortness of breath Walking oximetry today on room air Try to stop clearing your throat. Use sugar-free candy and try to swallow when you have the urge to clear it.  Continue your fexofenadine, nexium and fluticasone nasal spray as you are taking them  Follow with Dr Lamonte Sakai in 1 month

## 2018-01-29 NOTE — Assessment & Plan Note (Signed)
Suspect contributions of GERD, allergic rhinitis.  She has less nasal congestion now that she is added fluticasone to fexofenadine but has not really changed her globus sensation or her cough pattern. She is on generic dose Nexium.  I have discussed cough hygiene, avoiding throat clearing etc.  Could consider expanding the work-up going forward, checking eosinophils, upper airway inspection etc.

## 2018-01-29 NOTE — Assessment & Plan Note (Signed)
Her spirometry is reassuring without any evidence of obstruction.  Her total lung capacity and residual volume are both decreased consistent with restrictive lung disease.  Likewise her diffusion capacity is decreased.  Etiology for this is unclear, no evidence for an elevated hemidiaphragm by chest x-ray.  Must consider possible interstitial disease given her prior CT scan of the chest from 2014 even in light of her clear chest x-ray from 09/2017.  I think she needs a high-resolution CT scan of the chest to screen for ILD.  We will also perform walking oximetry today to ensure no occult desaturation.  No role here for bronchodilators.  I think it would be reasonable to perform a cardiopulmonary exercise test to screen for either a cardiac component or deconditioning.  She is agreeable and I will arrange for this.

## 2018-01-30 ENCOUNTER — Other Ambulatory Visit: Payer: Self-pay

## 2018-01-30 ENCOUNTER — Encounter: Payer: Self-pay | Admitting: Internal Medicine

## 2018-01-30 MED ORDER — ROPINIROLE HCL 0.5 MG PO TABS: ORAL_TABLET | ORAL | 0 refills | Status: DC

## 2018-01-30 MED ORDER — PRAVASTATIN SODIUM 40 MG PO TABS: 40.0000 mg | ORAL_TABLET | Freq: Every day | ORAL | 1 refills | Status: DC

## 2018-01-30 MED ORDER — SYNTHROID 88 MCG PO TABS: 88.0000 ug | ORAL_TABLET | Freq: Every day | ORAL | 1 refills | Status: DC

## 2018-02-03 ENCOUNTER — Encounter (HOSPITAL_COMMUNITY): Payer: Medicare Other

## 2018-02-10 ENCOUNTER — Ambulatory Visit (INDEPENDENT_AMBULATORY_CARE_PROVIDER_SITE_OTHER)
Admission: RE | Admit: 2018-02-10 | Discharge: 2018-02-10 | Disposition: A | Discharge status: Home / Self Care | Payer: Medicare Other | Source: Ambulatory Visit | Attending: Emergency Medicine | Admitting: Emergency Medicine

## 2018-02-10 DIAGNOSIS — J849 Interstitial pulmonary disease, unspecified: Secondary | ICD-10-CM

## 2018-02-10 DIAGNOSIS — K449 Diaphragmatic hernia without obstruction or gangrene: Secondary | ICD-10-CM | POA: Diagnosis not present

## 2018-02-11 ENCOUNTER — Encounter (HOSPITAL_COMMUNITY): Payer: Medicare Other

## 2018-02-11 DIAGNOSIS — R6889 Other general symptoms and signs: Secondary | ICD-10-CM | POA: Diagnosis not present

## 2018-02-13 ENCOUNTER — Ambulatory Visit (HOSPITAL_COMMUNITY): Payer: Medicare Other | Attending: Emergency Medicine

## 2018-02-13 DIAGNOSIS — R0609 Other forms of dyspnea: Secondary | ICD-10-CM

## 2018-02-13 DIAGNOSIS — J849 Interstitial pulmonary disease, unspecified: Secondary | ICD-10-CM | POA: Diagnosis not present

## 2018-02-26 ENCOUNTER — Encounter: Payer: Self-pay | Admitting: Emergency Medicine

## 2018-02-26 ENCOUNTER — Ambulatory Visit: Payer: Medicare Other | Admitting: Emergency Medicine

## 2018-02-26 VITALS — BP 110/68 | HR 62 | Ht 66.0 in | Wt 155.0 lb

## 2018-02-26 DIAGNOSIS — R0609 Other forms of dyspnea: Secondary | ICD-10-CM

## 2018-02-26 DIAGNOSIS — J849 Interstitial pulmonary disease, unspecified: Secondary | ICD-10-CM | POA: Diagnosis not present

## 2018-02-26 NOTE — Assessment & Plan Note (Signed)
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  Question NSIP pattern.  Walking oximetry reassuring

## 2018-02-26 NOTE — Patient Instructions (Signed)
Lab work today Please continue to work on your exercise and conditioning as you have been doing. Follow-up in 6 months with a chest x-ray on the same day.  If your breathing changes, worsens in any way then we will follow-up sooner and check your imaging sooner. Follow with Dr Lamonte Sakai in 6 months or sooner if you have any problems

## 2018-02-26 NOTE — Assessment & Plan Note (Signed)
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  Etiology unclear.  She did have some pesticide exposure growing up but no other relevant exposures.  I think she needs an autoimmune work-up and we will order today.  Follow her chest x-ray and CT scans for interval change, follow for clinical change.  If any evidence for progression that I like for her to see Dr. Chase Caller in the ILD clinic to talk about next steps, diagnostics, therapeutics.

## 2018-02-26 NOTE — Addendum Note (Signed)
Addended by: Suzzanne Cloud E on: 02/26/2018 12:17 PM   Modules accepted: Orders

## 2018-02-26 NOTE — Progress Notes (Signed)
Subjective:    Patient ID: Elizabeth Espinoza, female    DOB: 24-Jun-1940, 78 y.o.   MRN: [id number]  HPI Elizabeth Espinoza is a 78 year old never smoker with a history of allergic rhinitis, GERD, hypothyroidism, MVP.  She is been seen in our office in the past by Dr. Gwenette Greet for restless leg syndrome-she still treated for this with Requip. On allegra, nexium prn.   She is referred today for dyspnea.  She reports that she has had an exercise routine where she walked w her husband 2 miles, exercises at the [facility], for several years. She has noticed more SOB, especially with mild hills. Sometimes now has to stop briefly to complete the walk. Occasional chest tightness, no wheeze. No overt CP. She reports daily cough, often in the am, sometimes clear mucous. She is on allegra. She had a walking stress test 09/2014 > reassuring ECG w exercise. Weight has been stable. Her last TSH was 06/25/17, 0.73.   Review of her notes shows hx possible GGI on prior CT 2014.   ROV 01/29/18 --this is a follow-up visit for exertional shortness of breath.  As above patient is 50, never smoker without much past medical history beyond mitral valve prolapse.  She has a CT chest from 2014 that question some pneumonitis.  I performed a chest x-ray 12/04/2017 that I reviewed, it is reassuring without any evidence of cardiopulmonary disease.  She underwent pulmonary function testing today Which I reviewed, shows normal airflows without a bronchodilator response, restricted lung volumes and a decreased diffusion capacity that does not correct when adjusted for her alveolar volume.  She has not been walking as much. Her breathing is about the same, still has to slow down after a hill. She has globus sensation, still on nexium, allegra. She started flonase last time.    ROV 02/26/18 --78 year old never smoker with a history of mitral valve prolapse whom I saw earlier this year for exertional dyspnea and chronic cough.  Pulmonary function testing  consistent with restriction.  She has GERD and allergic rhinitis both of which could be contributing to her chronic cough.  I performed a high-resolution CT scan of the chest on 1/20 and reviewed today, this shows some patchy confluent subpleural reticular disease and groundglass attenuation with some minimal traction bronchiectasis and no frank honeycomb change.  This reflects a progression compared with 2014 and is an NSIP pattern.  Walking oximetry at her last visit did not show any exertional desaturation. She is still coughing, is using fexofenadine, takes nexium qod.   [place], had [place] exposure.     Review of Systems  Constitutional: Negative for fever and unexpected weight change.  HENT: Positive for congestion and sinus pressure. Negative for dental problem, ear pain, nosebleeds, postnasal drip, rhinorrhea, sneezing, sore throat and trouble swallowing.   Eyes: Negative for redness and itching.  Respiratory: Positive for shortness of breath. Negative for cough, chest tightness and wheezing.   Cardiovascular: Negative for palpitations and leg swelling.  Gastrointestinal: Negative for nausea and vomiting.  Genitourinary: Negative for dysuria.  Musculoskeletal: Negative for joint swelling.  Skin: Negative for rash.  Neurological: Negative for headaches.  Hematological: Does not bruise/bleed easily.  Psychiatric/Behavioral: Negative for dysphoric mood. The patient is not nervous/anxious.     Past Medical History:  Diagnosis Date  . Allergic rhinitis   . GERD (gastroesophageal reflux disease)   . Glaucoma    SUSPECT  . Hiatal hernia   . Hyperlipidemia  LDL goal = < 100  . Hypothyroidism   . MVP (mitral valve prolapse)    documented on 2 D ECHO     Family History  Problem Relation Age of Onset  . Lung cancer Father        Asbestos with mesothelioma  . Heart disease Paternal Grandmother        ? etiology  . Diabetes Paternal Grandmother   .  Diabetes Brother   . Uterine cancer Paternal Aunt   . Stroke Paternal Grandfather        > 24  . Heart attack Brother 48  . Allergies Sister   . Aneurysm Mother        congenital  . Stroke Brother        two strokes  . Barrett's esophagus Brother      Social History   Socioeconomic History  . Marital status: Married    Spouse name: Jeneen Rinks  . Number of children: Y  . Years of education: Not on file  . Highest education level: Not on file  Occupational History  . Occupation: retired from [employer]  . Financial resource strain: Not hard at all  . Food insecurity:    Worry: Never true    Inability: Never true  . Transportation needs:    Medical: No    Non-medical: No  Tobacco Use  . Smoking status: Never Smoker  . Smokeless tobacco: Never Used  Substance and Sexual Activity  . Alcohol use: Yes    Comment:  rarely  . Drug use: No  . Sexual activity: Yes  Lifestyle  . Physical activity:    Days per week: 5 days    Minutes per session: 50 min  . Stress: Not at all  Relationships  . Social connections:    Talks on phone: More than three times a week    Gets together: More than three times a week    Attends religious service: More than 4 times per year    Active member of club or organization: Yes    Attends meetings of clubs or organizations: More than 4 times per year    Relationship status: Married  . Intimate partner violence:    Fear of current or ex partner: No    Emotionally abused: No    Physically abused: No    Forced sexual activity: No  Other Topics Concern  . Not on file  Social History Narrative  . Not on file     Allergies  Allergen Reactions  . Caffeine     palpitations  . Chlorpheniramine-Pseudoeph     palpitations  . Maxifed     palpitations     Outpatient Medications Prior to Visit  Medication Sig Dispense Refill  . Biotin 1000 MCG tablet Take 1,000 mcg by mouth daily.    . Cholecalciferol (VITAMIN D-3 PO) Take by  mouth daily.    Marland Kitchen esomeprazole (NEXIUM) 20 MG capsule Take 1 capsule (20 mg total) by mouth daily at 12 noon. (Patient taking differently: Take 20 mg by mouth daily as needed. )    . Fexofenadine HCl (ALLEGRA PO) Take 1 tablet by mouth daily.     Marland Kitchen MAGNESIUM CARBONATE PO Take 1 tablet by mouth daily.     . pravastatin (PRAVACHOL) 40 MG tablet Take 1 tablet (40 mg total) by mouth daily. 90 tablet 1  . Probiotic Product (PROBIOTIC DAILY PO) Take 1 capsule by mouth daily as needed. ULTRA FLORA     .  rOPINIRole (REQUIP) 0.5 MG tablet TAKE 2 TABLETS BY MOUTH  AFTER DINNER EACH NIGHT. 180 tablet 0  . SYNTHROID 88 MCG tablet Take 1 tablet (88 mcg total) by mouth daily before breakfast. 90 tablet 1   No facility-administered medications prior to visit.         Objective:   Physical Exam  Vitals:   02/26/18 1135  BP: 110/68  Pulse: 62  SpO2: 100%  Weight: 155 lb (70.3 kg)  Height: _0  (1.676 m)   Gen: Pleasant, well-nourished, in no distress,  normal affect  ENT: No lesions,  mouth clear,  oropharynx clear, no postnasal drip  Neck: No JVD, no stridor  Lungs: No use of accessory muscles, clear, no wheeze, few B insp basilar crackles  Cardiovascular: Regular, History of MVP, no M  Musculoskeletal: No deformities, no cyanosis or clubbing  Neuro: alert, non focal  Skin: Warm, no lesions or rash       Assessment & Plan:  Dyspnea on exertion May be multifactorial with some degree of deconditioning but she does have mild peripheral interstitial disease noted on her CT chest, had some groundglass infiltrate on a CT chest going back to 2014.  Question NSIP pattern.  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  Etiology unclear.  She did have some pesticide exposure growing up but no other relevant exposures.  I think she needs an autoimmune work-up and we will order today.  Follow her chest x-ray and CT  scans for interval change, follow for clinical change.  If any evidence for progression that I like for her to see Dr. Chase Caller in the ILD clinic to talk about next steps, diagnostics, therapeutics.  Baltazar Apo, MD, PhD 02/26/2018, 12:13 PM [facility] [phone] or if no answer [phone]

## 2018-02-27 LAB — ANTI-JO 1 ANTIBODY, IGG: Anti JO-1: <0.2 AI (ref 0.0–0.9)

## 2018-02-28 LAB — ANTI-SMITH ANTIBODY: ENA SM Ab Ser-aCnc: <1 AI

## 2018-02-28 LAB — ALDOLASE: ALDOLASE: 5 U/L (ref <=8.1)

## 2018-02-28 LAB — CYCLIC CITRUL PEPTIDE ANTIBODY, IGG: Cyclic Citrullin Peptide Ab: <16 UNITS

## 2018-02-28 LAB — ANTI-NUCLEAR AB-TITER (ANA TITER)
ANA TITER: 1:80 {titer} — ABNORMAL HIGH
ANA Titer 1: 1:80 {titer} — ABNORMAL HIGH

## 2018-02-28 LAB — ANTI-SCLERODERMA ANTIBODY: SCLERODERMA (SCL-70) (ENA) ANTIBODY, IGG: NEGATIVE AI

## 2018-02-28 LAB — RHEUMATOID FACTOR: Rhuematoid fact SerPl-aCnc: <14 IU/mL (ref <14)

## 2018-02-28 LAB — ANA: Anti Nuclear Antibody(ANA): POSITIVE — AB

## 2018-02-28 LAB — SJOGREN'S SYNDROME ANTIBODS(SSA + SSB)
SSA (RO) (ENA) ANTIBODY, IGG: NEGATIVE AI
SSB (La) (ENA) Antibody, IgG: <1 AI

## 2018-02-28 LAB — ANTI-DNA ANTIBODY, DOUBLE-STRANDED

## 2018-02-28 LAB — RNP ANTIBODY: Ribonucleic Protein(ENA) Antibody, IgG: <1 AI

## 2018-03-06 ENCOUNTER — Other Ambulatory Visit: Payer: Self-pay | Admitting: Emergency Medicine

## 2018-03-06 DIAGNOSIS — R0609 Other forms of dyspnea: Secondary | ICD-10-CM

## 2018-04-17 ENCOUNTER — Encounter: Payer: Self-pay | Admitting: Internal Medicine

## 2018-04-17 MED ORDER — SYNTHROID 88 MCG PO TABS: 88.0000 ug | ORAL_TABLET | Freq: Every day | ORAL | 1 refills | Status: DC

## 2018-04-24 ENCOUNTER — Ambulatory Visit: Payer: PPO | Admitting: Internal Medicine

## 2018-05-21 ENCOUNTER — Other Ambulatory Visit: Payer: Self-pay | Admitting: Internal Medicine

## 2018-05-21 DIAGNOSIS — Z1231 Encounter for screening mammogram for malignant neoplasm of breast: Secondary | ICD-10-CM

## 2018-05-28 ENCOUNTER — Other Ambulatory Visit: Payer: Self-pay | Admitting: Internal Medicine

## 2018-05-28 ENCOUNTER — Telehealth: Payer: Self-pay

## 2018-05-28 NOTE — Telephone Encounter (Signed)
LM for patient to call us back and see if we can get her scheduled for a virtual follow up.

## 2018-05-29 NOTE — Telephone Encounter (Signed)
Pt declined scheduling at this time. Will send to Nurse to make aware. Nothing further needed.

## 2018-05-29 NOTE — Telephone Encounter (Signed)
Noted! Thank you

## 2018-06-03 ENCOUNTER — Telehealth: Payer: Self-pay | Admitting: Cardiology

## 2018-06-03 ENCOUNTER — Telehealth: Payer: Self-pay

## 2018-06-03 NOTE — Telephone Encounter (Signed)
New Message             Patient has a appt on Friday, as of now it says O V and not Virtual, patient does not want to do Virtual as I told her that is what we are doing as of now. Patient wants a call back to verify what type of appt she will have.

## 2018-06-03 NOTE — Telephone Encounter (Signed)
I see where Dr.[name] is DOD on Friday- is this patient to come into the office, or do virtual? Patient would rather not do a virtual visit.

## 2018-06-03 NOTE — Telephone Encounter (Signed)
Follow Up:    Returning your call.

## 2018-06-03 NOTE — Telephone Encounter (Signed)
Left message for pt to call

## 2018-06-03 NOTE — Telephone Encounter (Deleted)

## 2018-06-03 NOTE — Telephone Encounter (Signed)
ERROR

## 2018-06-03 NOTE — Telephone Encounter (Signed)
Spoke with pt, questions regarding upcoming appointment answered.

## 2018-06-04 NOTE — Progress Notes (Signed)
Referring-Robert [name] MD Reason for referral-Dyspnea  HPI: 78 year old female for evaluation of dyspnea at request of Baltazar Apo, MD.  ETT September 2016 normal. CPX January 2020 showed normal functional capacity for age with duration 8:45; no ST changes; patient complained of chest pain, dyspnea and dizziness; there were other abnormal parameters suggesting cardiovascular limitation and also pulmonary impairment.  High resolution chest CT January 2020 showed findings compatible with fibrotic interstitial lung disease without honeycombing.  There was note of LAD atherosclerosis.  Patient states that for the past 9 months to 1 year she has had increasing dyspnea on exertion.  She notices this with climbing hills or climbing stairs.  She improves as the grade level is off.  She has not had orthopnea, PND, pedal edema or syncope.  She feels some chest tightness with climbing a hill that increases with inspiration.  Because of the above cardiology asked to evaluate.  Current Outpatient Medications  Medication Sig Dispense Refill  . Biotin 1000 MCG tablet Take 1,000 mcg by mouth daily.    . Cholecalciferol (VITAMIN D-3 PO) Take by mouth daily.    Marland Kitchen esomeprazole (NEXIUM) 20 MG capsule Take 1 capsule (20 mg total) by mouth daily at 12 noon. (Patient taking differently: Take 20 mg by mouth daily as needed. )    . Fexofenadine HCl (ALLEGRA PO) Take 1 tablet by mouth daily.     Marland Kitchen MAGNESIUM CARBONATE PO Take 1 tablet by mouth daily.     . pravastatin (PRAVACHOL) 40 MG tablet TAKE 1 TABLET BY MOUTH  DAILY 90 tablet 1  . rOPINIRole (REQUIP) 0.5 MG tablet TAKE 2 TABLETS BY MOUTH  AFTER DINNER EACH NIGHT. 180 tablet 0  . SYNTHROID 88 MCG tablet Take 1 tablet (88 mcg total) by mouth daily before breakfast. 90 tablet 1  . Probiotic Product (PROBIOTIC DAILY PO) Take 1 capsule by mouth daily as needed. ULTRA FLORA      No current facility-administered medications for this visit.     Allergies  Allergen  Reactions  . Caffeine     palpitations  . Chlorpheniramine-Pseudoeph     palpitations  . Maxifed     palpitations     Past Medical History:  Diagnosis Date  . Allergic rhinitis   . GERD (gastroesophageal reflux disease)   . Glaucoma    SUSPECT  . Hiatal hernia   . Hyperlipidemia    LDL goal = < 100  . Hypothyroidism   . ILD (interstitial lung disease) ([place])   . MVP (mitral valve prolapse)    documented on 2 D ECHO    Past Surgical History:  Procedure Laterality Date  . APPENDECTOMY     with TAH  . CARDIAC CATHETERIZATION  1989   negative  . COLONOSCOPY  2005   Dr Olevia Perches  . COLONOSCOPY  01/2013   Dr Collene Mares  . esophageal dilation  07/04/2011   Dr Olevia Perches  . FLEXIBLE SIGMOIDOSCOPY  1999    Dr Olevia Perches  . KNEE ARTHROSCOPY  2001, 2005   Dr Percell Miller   bilat  . TOTAL ABDOMINAL HYSTERECTOMY     for Endometriosis; no BSO, Dr Colin Ina  . UPPER GI ENDOSCOPY  01/2013   Dr Collene Mares    Social History   Socioeconomic History  . Marital status: Married    Spouse name: Jeneen Rinks  . Number of children: 2  . Years of education: Not on file  . Highest education level: Not on file  Occupational History  . Occupation:  retired from [employer]  . Financial resource strain: Not hard at all  . Food insecurity:    Worry: Never true    Inability: Never true  . Transportation needs:    Medical: No    Non-medical: No  Tobacco Use  . Smoking status: Never Smoker  . Smokeless tobacco: Never Used  Substance and Sexual Activity  . Alcohol use: Yes    Comment:  rarely  . Drug use: No  . Sexual activity: Yes  Lifestyle  . Physical activity:    Days per week: 5 days    Minutes per session: 50 min  . Stress: Not at all  Relationships  . Social connections:    Talks on phone: More than three times a week    Gets together: More than three times a week    Attends religious service: More than 4 times per year    Active member of club or organization: Yes    Attends meetings  of clubs or organizations: More than 4 times per year    Relationship status: Married  . Intimate partner violence:    Fear of current or ex partner: No    Emotionally abused: No    Physically abused: No    Forced sexual activity: No  Other Topics Concern  . Not on file  Social History Narrative  . Not on file    Family History  Problem Relation Age of Onset  . Lung cancer Father        Asbestos with mesothelioma  . Heart disease Paternal Grandmother        ? etiology  . Diabetes Paternal Grandmother   . Diabetes Brother   . Uterine cancer Paternal Aunt   . Stroke Paternal Grandfather        > 47  . Heart attack Brother 87  . Allergies Sister   . Aneurysm Mother        congenital  . Stroke Brother        two strokes  . Barrett's esophagus Brother     ROS: no fevers or chills, productive cough, hemoptysis, dysphasia, odynophagia, melena, hematochezia, dysuria, hematuria, rash, seizure activity, orthopnea, PND, pedal edema, claudication. Remaining systems are negative.  Physical Exam:   Blood pressure 118/62, pulse 70, height _0  (1.676 m), weight 150 lb 12.8 oz (68.4 kg), SpO2 96 %.  General:  Well developed/well nourished in NAD Skin warm/dry Patient not depressed No peripheral clubbing Back-normal HEENT-normal/normal eyelids Neck supple/normal carotid upstroke bilaterally; no bruits; no JVD; no thyromegaly chest - CTA/ normal expansion CV - RRR/normal S1 and S2; no murmurs, rubs or gallops;  PMI nondisplaced Abdomen -NT/ND, no HSM, no mass, + bowel sounds, no bruit 2+ femoral pulses, no bruits Ext-no edema, chords, 2+ DP Neuro-grossly nonfocal  ECG -sinus bradycardia at a rate of 52, no ST changes.  Personally reviewed  A/P  1 Dyspnea-etiology unclear.  Question related to interstitial lung disease.  She also was noted to have calcium in her LAD.  I have reviewed her CT scan and calcium appears to be mild.  We will arrange a cardiac CTA to exclude  significant coronary disease.  I will also arrange an echocardiogram to assess LV function.  She is not volume overloaded on examination.  2 coronary artery calcification-mild coronary calcification noted on CT.  Continue statin.  If calcium score significantly elevated with CTA will add aspirin 81 mg daily.  3 interstitial lung disease-per pulmonary.  4  hyperlipidemia-continue statin.  Kirk Ruths, MD

## 2018-06-06 ENCOUNTER — Encounter

## 2018-06-06 ENCOUNTER — Ambulatory Visit: Payer: Medicare Other | Admitting: Cardiology

## 2018-06-06 ENCOUNTER — Other Ambulatory Visit (INDEPENDENT_AMBULATORY_CARE_PROVIDER_SITE_OTHER): Payer: Medicare Other

## 2018-06-06 ENCOUNTER — Other Ambulatory Visit: Payer: Self-pay

## 2018-06-06 ENCOUNTER — Encounter: Payer: Self-pay | Admitting: Cardiology

## 2018-06-06 VITALS — BP 118/62 | HR 70 | Ht 66.0 in | Wt 150.8 lb

## 2018-06-06 DIAGNOSIS — I2584 Coronary atherosclerosis due to calcified coronary lesion: Secondary | ICD-10-CM

## 2018-06-06 DIAGNOSIS — E7800 Pure hypercholesterolemia, unspecified: Secondary | ICD-10-CM | POA: Diagnosis not present

## 2018-06-06 DIAGNOSIS — I2510 Atherosclerotic heart disease of native coronary artery without angina pectoris: Secondary | ICD-10-CM | POA: Diagnosis not present

## 2018-06-06 DIAGNOSIS — R0602 Shortness of breath: Secondary | ICD-10-CM | POA: Diagnosis not present

## 2018-06-06 DIAGNOSIS — Z8679 Personal history of other diseases of the circulatory system: Secondary | ICD-10-CM | POA: Diagnosis not present

## 2018-06-06 DIAGNOSIS — E782 Mixed hyperlipidemia: Secondary | ICD-10-CM | POA: Diagnosis not present

## 2018-06-06 DIAGNOSIS — R072 Precordial pain: Secondary | ICD-10-CM

## 2018-06-06 MED ORDER — METOPROLOL TARTRATE 50 MG PO TABS: ORAL_TABLET | ORAL | 0 refills | Status: DC

## 2018-06-06 NOTE — Patient Instructions (Signed)
Medication Instructions:  NO CHANGE If you need a refill on your cardiac medications before your next appointment, please call your pharmacy.   Lab work: If you have labs (blood work) drawn today and your tests are completely normal, you will receive your results only by: Marland Kitchen MyChart Message (if you have MyChart) OR . A paper copy in the mail If you have any lab test that is abnormal or we need to change your treatment, we will call you to review the results.  Testing/Procedures: Your physician has requested that you have an echocardiogram. Echocardiography is a painless test that uses sound waves to create images of your heart. It provides your doctor with information about the size and shape of your heart and how well your heart's chambers and valves are working. This procedure takes approximately one hour. There are no restrictions for this procedure.  [place]  Please arrive at the [facility] main entrance of [facility] at xx:xx AM (30-45 minutes prior to test start time)  [facility] [address], [place] [address] [phone]  Proceed to the [facility] Radiology Department (First Floor).  Please follow these instructions carefully (unless otherwise directed):  Hold all erectile dysfunction medications at least 48 hours prior to test.  On the Night Before the Test: . Be sure to Drink plenty of water. . Do not consume any caffeinated/decaffeinated beverages or chocolate 12 hours prior to your test. . Do not take any antihistamines 12 hours prior to your test.  On the Day of the Test: . Drink plenty of water. Do not drink any water within one hour of the test. . Do not eat any food 4 hours prior to the test. . You may take your regular medications prior to the test.  . Take metoprolol (Lopressor) two hours prior to test.       After the Test: . Drink plenty of water. . After receiving IV contrast, you may experience a mild  flushed feeling. This is normal. . On occasion, you may experience a mild rash up to 24 hours after the test. This is not dangerous. If this occurs, you can take Benadryl 25 mg and increase your fluid intake. . If you experience trouble breathing, this can be serious. If it is severe call 911 IMMEDIATELY. If it is mild, please call our office. . If you take any of these medications: Glipizide/Metformin, Avandament, Glucavance, please do not take 48 hours after completing test.    Follow-Up: At [facility], you and your health needs are our priority.  As part of our continuing mission to provide you with exceptional heart care, we have created designated Provider Care Teams.  These Care Teams include your primary Cardiologist (physician) and Advanced Practice Providers (APPs -  Physician Assistants and Nurse Practitioners) who all work together to provide you with the care you need, when you need it. You will need a follow up appointment in 4 months.  Please call our office 2 months in advance to schedule this appointment.  You may see Kirk Ruths MD or one of the following Advanced Practice Providers on your designated Care Team:   Kerin Ransom, PA-C Roby Lofts, [place] . Sande Rives, PA-C

## 2018-06-13 ENCOUNTER — Telehealth (HOSPITAL_COMMUNITY): Payer: Self-pay | Admitting: Radiology

## 2018-06-13 NOTE — Telephone Encounter (Signed)
Left message-gave instructions for echocardiogram but could not do COVID 19

## 2018-06-17 ENCOUNTER — Ambulatory Visit (HOSPITAL_COMMUNITY): Payer: Medicare Other | Attending: Cardiovascular Disease

## 2018-06-17 ENCOUNTER — Other Ambulatory Visit: Payer: Self-pay

## 2018-06-17 DIAGNOSIS — R0602 Shortness of breath: Secondary | ICD-10-CM | POA: Diagnosis not present

## 2018-07-02 NOTE — Progress Notes (Addendum)
Subjective:   Elizabeth Espinoza is a 78 y.o. female who presents for Medicare Annual (Subsequent) preventive examination. I connected with patient by a telephone and verified that I am speaking with the correct person using two identifiers. Patient stated full name and DOB. Patient gave permission to continue with telephonic visit. Patient's location was at home and Nurse's location was at [place] office.   Review of Systems:  No ROS.  Medicare Wellness Virtual Visit.  Visual/audio telehealth visit, UTA vital signs.   See social history for additional risk factors. Cardiac Risk Factors include: dyslipidemia;hypertension;advanced age (>49mn, >>58women) Sleep patterns: feels rested on waking, gets up 0-2 times nightly to void and sleeps hours vary nightly.  Patient reports insomnia issues, discussed recommended sleep tips and stress reduction tips.  Home Safety/Smoke Alarms: Feels safe in home. Smoke alarms in place.  Living environment; residence and Firearm Safety: 1-story house/ trailer. Lives with husband, no needs for DME, good support system Seat Belt Safety/Bike Helmet: Wears seat belt.     Objective:     Vitals: There were no vitals taken for this visit.  There is no height or weight on file to calculate BMI.  Advanced Directives 07/03/2018 07/02/2017 06/14/2016  Does Patient Have a Medical Advance Directive? Yes Yes Yes  Type of AParamedicof AArmadaLiving will HIdalouLiving will HMcKeeLiving will  Copy of HAlleghenyin Chart? No - copy requested No - copy requested No - copy requested    Tobacco Social History   Tobacco Use  Smoking Status Never Smoker  Smokeless Tobacco Never Used     Counseling given: Not Answered  Past Medical History:  Diagnosis Date  . Allergic rhinitis   . GERD (gastroesophageal reflux disease)   . Glaucoma    SUSPECT  . Hiatal hernia   . Hyperlipidemia     LDL goal = < 100  . Hypothyroidism   . ILD (interstitial lung disease) (HFloyd   . MVP (mitral valve prolapse)    documented on 2 D ECHO   Past Surgical History:  Procedure Laterality Date  . APPENDECTOMY     with TAH  . CARDIAC CATHETERIZATION  1989   negative  . COLONOSCOPY  2005   Dr BOlevia Perches . COLONOSCOPY  01/2013   Dr mCollene Mares . esophageal dilation  07/04/2011   Dr BOlevia Perches . FLEXIBLE SIGMOIDOSCOPY  1999    Dr BOlevia Perches . KNEE ARTHROSCOPY  2001, 2005   Dr MPercell Miller  bilat  . TOTAL ABDOMINAL HYSTERECTOMY     for Endometriosis; no BSO, Dr HColin Ina . UPPER GI ENDOSCOPY  01/2013   Dr MCollene Mares  Family History  Problem Relation Age of Onset  . Lung cancer Father        Asbestos with mesothelioma  . Heart disease Paternal Grandmother        ? etiology  . Diabetes Paternal Grandmother   . Diabetes Brother   . Uterine cancer Paternal Aunt   . Stroke Paternal Grandfather        > 530 . Heart attack Brother 619 . Allergies Sister   . Aneurysm Mother        congenital  . Stroke Brother        two strokes  . Barrett's esophagus Brother    Social History   Socioeconomic History  . Marital status: Married    Spouse name: JJeneen Rinks .  Number of children: 2  . Years of education: Not on file  . Highest education level: Not on file  Occupational History  . 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 Financial resource strain: Not hard at all  . Food insecurity    Worry: Never true    Inability: Never true  . Transportation needs    Medical: No    Non-medical: No  Tobacco Use  . Smoking status: Never Smoker  . Smokeless tobacco: Never Used  Substance and Sexual Activity  . Alcohol use: Yes    Comment:  rarely  . Drug use: No  . Sexual activity: Yes  Lifestyle  . Physical activity    Days per week: 5 days    Minutes per session: 50 min  . Stress: Not at all  Relationships  . Social connections    Talks on phone: More than three times a week    Gets  together: More than three times a week    Attends religious service: More than 4 times per year    Active member of club or organization: Yes    Attends meetings of clubs or organizations: More than 4 times per year    Relationship status: Married  Other Topics Concern  . Not on file  Social History Narrative  . Not on file    Outpatient Encounter Medications as of 07/03/2018  Medication Sig  . Biotin 1000 MCG tablet Take 1,000 mcg by mouth daily.  . Cholecalciferol (VITAMIN D-3 PO) Take by mouth daily.  Marland Kitchen esomeprazole (NEXIUM) 20 MG capsule Take 1 capsule (20 mg total) by mouth daily at 12 noon. (Patient taking differently: Take 20 mg by mouth daily as needed. )  . Fexofenadine HCl (ALLEGRA PO) Take 1 tablet by mouth daily.   Marland Kitchen MAGNESIUM CARBONATE PO Take 1 tablet by mouth daily.   . metoprolol tartrate (LOPRESSOR) 50 MG tablet TAKE 2 HOURS PRIOR TO CT SCAN (Patient taking differently: TAKE 2 HOURS PRIOR TO CT SCAN)  . pravastatin (PRAVACHOL) 40 MG tablet TAKE 1 TABLET BY MOUTH  DAILY  . Probiotic Product (PROBIOTIC DAILY PO) Take 1 capsule by mouth daily as needed. ULTRA FLORA   . rOPINIRole (REQUIP) 0.5 MG tablet TAKE 2 TABLETS BY MOUTH  AFTER DINNER EACH NIGHT.  Marland Kitchen SYNTHROID 88 MCG tablet Take 1 tablet (88 mcg total) by mouth daily before breakfast.  . [DISCONTINUED] loratadine (CLARITIN) 10 MG tablet Take 10 mg by mouth daily as needed.     No facility-administered encounter medications on file as of 07/03/2018.     Activities of Daily Living In your present state of health, do you have any difficulty performing the following activities: 07/03/2018  Hearing? N  Vision? N  Difficulty concentrating or making decisions? N  Walking or climbing stairs? N  Dressing or bathing? N  Doing errands, shopping? N  Preparing Food and eating ? N  Using the Toilet? N  In the past six months, have you accidently leaked urine? N  Do you have problems with loss of bowel control? N  Managing  your Medications? N  Managing your Finances? N  Housekeeping or managing your Housekeeping? N  Some recent data might be hidden    Patient Care Team: Binnie Rail, MD as PCP - General (Internal Medicine) Stanford Breed Denice Bors, MD as Consulting Physician (Cardiology) Collene Gobble, MD as Consulting Physician (Pulmonary Disease)    Assessment:   This is a routine wellness  examination for Cordelia.  Exercise Activities and Dietary recommendations Current Exercise Habits: Home exercise routine, Type of exercise: walking;calisthenics(classes at the [facility]), Time (Minutes): 50, Frequency (Times/Week): 6, Weekly Exercise (Minutes/Week): 300, Intensity: Mild, Exercise limited by: respiratory conditions(s)  Diet (meal preparation, eat out, water intake, caffeinated beverages, dairy products, fruits and vegetables): in general, a "healthy" diet  , well balanced.  Reviewed heart healthy diet. Encouraged patient to increase daily water and healthy fluid intake.  Goals    . Lose weight to reach goal of 145 pounds     Decrease the amount sweets, continue to exercise, eat healthy, enjoy life, and family    . Patient Stated     Continue to be active by going to the gym, eat healthy, enjoy life, family and church       Fall Risk Fall Risk  07/03/2018 07/02/2017 07/02/2017 06/14/2016 12/30/2015  Falls in the past year? 1 No No No No  Comment - - - - -  Number falls in past yr: 0 - - - -   Depression Screen PHQ 2/9 Scores 07/03/2018 07/02/2017 07/02/2017 06/14/2016  PHQ - 2 Score 0 0 0 0     Cognitive Function       Ad8 score reviewed for issues:  Issues making decisions: no  Less interest in hobbies / activities: no  Repeats questions, stories (family complaining): no  Trouble using ordinary gadgets (microwave, computer, phone):no  Forgets the month or year: no  Mismanaging finances: no  Remembering appts: no  Daily problems with thinking and/or memory: no Ad8 score is= 0   Immunization History  Administered Date(s) Administered  . Influenza Split 11/07/2010  . Influenza Whole 10/30/2009, 11/27/2011  . Influenza, High Dose Seasonal PF 10/27/2012, 11/05/2013, 10/01/2017  . Influenza-Unspecified 11/24/2014, 10/07/2015, 10/08/2016  . Pneumococcal Conjugate-13 10/07/2015  . Pneumococcal Polysaccharide-23 07/02/2017  . Tdap 02/11/2012  . Zoster 01/09/2012  . Zoster Recombinat (Shingrix) 10/01/2017, 12/03/2017   Screening Tests Health Maintenance  Topic Date Due  . INFLUENZA VACCINE  08/23/2018  . DEXA SCAN  11/05/2021  . TETANUS/TDAP  02/10/2022  . PNA vac Low Risk Adult  Completed      Plan:     Reviewed health maintenance screenings with patient today and relevant education, vaccines, and/or referrals were provided.   Continue to eat heart healthy diet (full of fruits, vegetables, whole grains, lean protein, water--limit salt, fat, and sugar intake) and increase physical activity as tolerated.  Continue doing brain stimulating activities (puzzles, reading, adult coloring books, staying active) to keep memory sharp.   I have personally reviewed and noted the following in the patient's chart:   . Medical and social history . Use of alcohol, tobacco or illicit drugs  . Current medications and supplements . Functional ability and status . Nutritional status . Physical activity . Advanced directives . List of other physicians . Screenings to include cognitive, depression, and falls . Referrals and appointments  In addition, I have reviewed and discussed with patient certain preventive protocols, quality metrics, and best practice recommendations. A written personalized care plan for preventive services as well as general preventive health recommendations were provided to patient.     Michiel Cowboy, RN  07/03/2018    Medical screening examination/treatment/procedure(s) were performed by non-physician practitioner and as supervising physician I was  immediately available for consultation/collaboration. I agree with above. Lew Dawes, MD

## 2018-07-03 ENCOUNTER — Other Ambulatory Visit: Payer: Self-pay

## 2018-07-03 ENCOUNTER — Ambulatory Visit (INDEPENDENT_AMBULATORY_CARE_PROVIDER_SITE_OTHER): Payer: Medicare Other | Admitting: *Deleted

## 2018-07-03 DIAGNOSIS — Z0000 Encounter for general adult medical examination without abnormal findings: Secondary | ICD-10-CM

## 2018-07-03 NOTE — Patient Instructions (Addendum)
Tests ordered today. Your results will be released to MyChart (or called to you) after review, usually within 72hours after test completion. If any changes need to be made, you will be notified at that same time.  All other Health Maintenance issues reviewed.   All recommended immunizations and age-appropriate screenings are up-to-date or discussed.  No immunizations administered today.   Medications reviewed and updated.  Changes include :  none    Please followup in one year   Health Maintenance, Female Adopting a healthy lifestyle and getting preventive care can go a long way to promote health and wellness. Talk with your health care provider about what schedule of regular examinations is right for you. This is a good chance for you to check in with your provider about disease prevention and staying healthy. In between checkups, there are plenty of things you can do on your own. Experts have done a lot of research about which lifestyle changes and preventive measures are most likely to keep you healthy. Ask your health care provider for more information. Weight and diet Eat a healthy diet  Be sure to include plenty of vegetables, fruits, low-fat dairy products, and lean protein.  Do not eat a lot of foods high in solid fats, added sugars, or salt.  Get regular exercise. This is one of the most important things you can do for your health. ? Most adults should exercise for at least 150 minutes each week. The exercise should increase your heart rate and make you sweat (moderate-intensity exercise). ? Most adults should also do strengthening exercises at least twice a week. This is in addition to the moderate-intensity exercise. Maintain a healthy weight  Body mass index (BMI) is a measurement that can be used to identify possible weight problems. It estimates body fat based on height and weight. Your health care provider can help determine your BMI and help you achieve or maintain a  healthy weight.  For females 20 years of age and older: ? A BMI below 18.5 is considered underweight. ? A BMI of 18.5 to 24.9 is normal. ? A BMI of 25 to 29.9 is considered overweight. ? A BMI of 30 and above is considered obese. Watch levels of cholesterol and blood lipids  You should start having your blood tested for lipids and cholesterol at 77 years of age, then have this test every 5 years.  You may need to have your cholesterol levels checked more often if: ? Your lipid or cholesterol levels are high. ? You are older than 77 years of age. ? You are at high risk for heart disease. Cancer screening Lung Cancer  Lung cancer screening is recommended for adults 55-80 years old who are at high risk for lung cancer because of a history of smoking.  A yearly low-dose CT scan of the lungs is recommended for people who: ? Currently smoke. ? Have quit within the past 15 years. ? Have at least a 30-pack-year history of smoking. A pack year is smoking an average of one pack of cigarettes a day for 1 year.  Yearly screening should continue until it has been 15 years since you quit.  Yearly screening should stop if you develop a health problem that would prevent you from having lung cancer treatment. Breast Cancer  Practice breast self-awareness. This means understanding how your breasts normally appear and feel.  It also means doing regular breast self-exams. Let your health care provider know about any changes, no matter how small.    If you are in your 20s or 30s, you should have a clinical breast exam (CBE) by a health care provider every 1-3 years as part of a regular health exam.  If you are 40 or older, have a CBE every year. Also consider having a breast X-ray (mammogram) every year.  If you have a family history of breast cancer, talk to your health care provider about genetic screening.  If you are at high risk for breast cancer, talk to your health care provider about having  an MRI and a mammogram every year.  Breast cancer gene (BRCA) assessment is recommended for women who have family members with BRCA-related cancers. BRCA-related cancers include: ? Breast. ? Ovarian. ? Tubal. ? Peritoneal cancers.  Results of the assessment will determine the need for genetic counseling and BRCA1 and BRCA2 testing. Cervical Cancer Your health care provider may recommend that you be screened regularly for cancer of the pelvic organs (ovaries, uterus, and vagina). This screening involves a pelvic examination, including checking for microscopic changes to the surface of your cervix (Pap test). You may be encouraged to have this screening done every 3 years, beginning at age 21.  For women ages 30-65, health care providers may recommend pelvic exams and Pap testing every 3 years, or they may recommend the Pap and pelvic exam, combined with testing for human papilloma virus (HPV), every 5 years. Some types of HPV increase your risk of cervical cancer. Testing for HPV may also be done on women of any age with unclear Pap test results.  Other health care providers may not recommend any screening for nonpregnant women who are considered low risk for pelvic cancer and who do not have symptoms. Ask your health care provider if a screening pelvic exam is right for you.  If you have had past treatment for cervical cancer or a condition that could lead to cancer, you need Pap tests and screening for cancer for at least 20 years after your treatment. If Pap tests have been discontinued, your risk factors (such as having a new sexual partner) need to be reassessed to determine if screening should resume. Some women have medical problems that increase the chance of getting cervical cancer. In these cases, your health care provider may recommend more frequent screening and Pap tests. Colorectal Cancer  This type of cancer can be detected and often prevented.  Routine colorectal cancer screening  usually begins at 77 years of age and continues through 77 years of age.  Your health care provider may recommend screening at an earlier age if you have risk factors for colon cancer.  Your health care provider may also recommend using home test kits to check for hidden blood in the stool.  A small camera at the end of a tube can be used to examine your colon directly (sigmoidoscopy or colonoscopy). This is done to check for the earliest forms of colorectal cancer.  Routine screening usually begins at age 50.  Direct examination of the colon should be repeated every 5-10 years through 77 years of age. However, you may need to be screened more often if early forms of precancerous polyps or small growths are found. Skin Cancer  Check your skin from head to toe regularly.  Tell your health care provider about any new moles or changes in moles, especially if there is a change in a mole's shape or color.  Also tell your health care provider if you have a mole that is larger than the   size of a pencil eraser.  Always use sunscreen. Apply sunscreen liberally and repeatedly throughout the day.  Protect yourself by wearing long sleeves, pants, a wide-brimmed hat, and sunglasses whenever you are outside. Heart disease, diabetes, and high blood pressure  High blood pressure causes heart disease and increases the risk of stroke. High blood pressure is more likely to develop in: ? People who have blood pressure in the high end of the normal range (130-139/85-89 mm Hg). ? People who are overweight or obese. ? People who are African American.  If you are 18-39 years of age, have your blood pressure checked every 3-5 years. If you are 40 years of age or older, have your blood pressure checked every year. You should have your blood pressure measured twice-once when you are at a hospital or clinic, and once when you are not at a hospital or clinic. Record the average of the two measurements. To check your  blood pressure when you are not at a hospital or clinic, you can use: ? An automated blood pressure machine at a pharmacy. ? A home blood pressure monitor.  If you are between 55 years and 79 years old, ask your health care provider if you should take aspirin to prevent strokes.  Have regular diabetes screenings. This involves taking a blood sample to check your fasting blood sugar level. ? If you are at a normal weight and have a low risk for diabetes, have this test once every three years after 77 years of age. ? If you are overweight and have a high risk for diabetes, consider being tested at a younger age or more often. Preventing infection Hepatitis B  If you have a higher risk for hepatitis B, you should be screened for this virus. You are considered at high risk for hepatitis B if: ? You were born in a country where hepatitis B is common. Ask your health care provider which countries are considered high risk. ? Your parents were born in a high-risk country, and you have not been immunized against hepatitis B (hepatitis B vaccine). ? You have HIV or AIDS. ? You use needles to inject street drugs. ? You live with someone who has hepatitis B. ? You have had sex with someone who has hepatitis B. ? You get hemodialysis treatment. ? You take certain medicines for conditions, including cancer, organ transplantation, and autoimmune conditions. Hepatitis C  Blood testing is recommended for: ? Everyone born from 1945 through 1965. ? Anyone with known risk factors for hepatitis C. Sexually transmitted infections (STIs)  You should be screened for sexually transmitted infections (STIs) including gonorrhea and chlamydia if: ? You are sexually active and are younger than 77 years of age. ? You are older than 77 years of age and your health care provider tells you that you are at risk for this type of infection. ? Your sexual activity has changed since you were last screened and you are at an  increased risk for chlamydia or gonorrhea. Ask your health care provider if you are at risk.  If you do not have HIV, but are at risk, it may be recommended that you take a prescription medicine daily to prevent HIV infection. This is called pre-exposure prophylaxis (PrEP). You are considered at risk if: ? You are sexually active and do not regularly use condoms or know the HIV status of your partner(s). ? You take drugs by injection. ? You are sexually active with a partner who has HIV.   who has HIV. Talk with your health care provider about whether you are at high risk of being infected with HIV. If you choose to begin PrEP, you should first be tested for HIV. You should then be tested every 3 months for as long as you are taking PrEP. Pregnancy  If you are premenopausal and you may become pregnant, ask your health care provider about preconception counseling.  If you may become pregnant, take 400 to 800 micrograms (mcg) of folic acid every day.  If you want to prevent pregnancy, talk to your health care provider about birth control (contraception). Osteoporosis and menopause  Osteoporosis is a disease in which the bones lose minerals and strength with aging. This can result in serious bone fractures. Your risk for osteoporosis can be identified using a bone density scan.  If you are 76 years of age or older, or if you are at risk for osteoporosis and fractures, ask your health care provider if you should be screened.  Ask your health care provider whether you should take a calcium or vitamin D supplement to lower your risk for osteoporosis.  Menopause may have certain physical symptoms and risks.  Hormone replacement therapy may reduce some of these symptoms and risks. Talk to your health care provider about whether hormone replacement therapy is right for you. Follow these instructions at home:  Schedule regular health, dental, and eye exams.  Stay current with your immunizations.  Do not use  any tobacco products including cigarettes, chewing tobacco, or electronic cigarettes.  If you are pregnant, do not drink alcohol.  If you are breastfeeding, limit how much and how often you drink alcohol.  Limit alcohol intake to no more than 1 drink per day for nonpregnant women. One drink equals 12 ounces of beer, 5 ounces of wine, or 1 ounces of hard liquor.  Do not use street drugs.  Do not share needles.  Ask your health care provider for help if you need support or information about quitting drugs.  Tell your health care provider if you often feel depressed.  Tell your health care provider if you have ever been abused or do not feel safe at home. This information is not intended to replace advice given to you by your health care provider. Make sure you discuss any questions you have with your health care provider. Document Released: 07/24/2010 Document Revised: 06/16/2015 Document Reviewed: 10/12/2014 Elsevier Interactive Patient Education  2019 [employer].

## 2018-07-03 NOTE — Progress Notes (Signed)
Subjective:    Patient ID: Elizabeth Espinoza, female    DOB: Nov 24, 1940, 78 y.o.   MRN: [id number]  HPI She is here for a physical exam.   She continues to have DOE.  She is doing some walking for exercise-her and her husband typically walk 2.8 miles.  She does get short of breath especially with inclines..  No shortness of breath is not consistent, for example she will sometimes get a going upstairs and sometimes she will.  It is about the same - not getting worse.  She is following with both pulmonary and cardiology.  She will be having a CT cardiac scan sometime in the near future.  She has no specific concerns.   Medications and allergies reviewed with patient and updated if appropriate.  Patient Active Problem List   Diagnosis Date Noted   Chronic cough 01/29/2018   Rash and nonspecific skin eruption 07/30/2016   Prediabetes 02/24/2015   Dyspnea on exertion 09/29/2014   Interstitial lung disease ([place]) 12/11/2012   Barrett's esophagus 12/11/2012   RLS (restless legs syndrome) 04/13/2011   Persistent disorder of initiating or maintaining sleep 04/13/2011   ARTHRALGIA 09/01/2008   HIATAL HERNIA 07/31/2007   History of cardiovascular disorder 07/31/2007   ALLERGIC RHINITIS WITH CONJUNCTIVITIS 06/19/2007   GASTROESOPHAGEAL REFLUX DISEASE, CHRONIC 06/19/2007   Hypothyroidism 02/07/2007   HYPERLIPIDEMIA 02/07/2007    Current Outpatient Medications on File Prior to Visit  Medication Sig Dispense Refill   Biotin 1000 MCG tablet Take 1,000 mcg by mouth daily.     Cholecalciferol (VITAMIN D-3 PO) Take by mouth daily.     esomeprazole (NEXIUM) 20 MG capsule Take 1 capsule (20 mg total) by mouth daily at 12 noon. (Patient taking differently: Take 20 mg by mouth daily as needed. )     Fexofenadine HCl (ALLEGRA PO) Take 1 tablet by mouth daily.      MAGNESIUM CARBONATE PO Take 1 tablet by mouth daily.      metoprolol tartrate (LOPRESSOR) 50 MG tablet TAKE 2 HOURS  PRIOR TO CT SCAN (Patient taking differently: TAKE 2 HOURS PRIOR TO CT SCAN) 1 tablet 0   pravastatin (PRAVACHOL) 40 MG tablet TAKE 1 TABLET BY MOUTH  DAILY 90 tablet 1   Probiotic Product (PROBIOTIC DAILY PO) Take 1 capsule by mouth daily as needed. ULTRA FLORA      rOPINIRole (REQUIP) 0.5 MG tablet TAKE 2 TABLETS BY MOUTH  AFTER DINNER EACH NIGHT. 180 tablet 0   SYNTHROID 88 MCG tablet Take 1 tablet (88 mcg total) by mouth daily before breakfast. 90 tablet 1   [DISCONTINUED] loratadine (CLARITIN) 10 MG tablet Take 10 mg by mouth daily as needed.       No current facility-administered medications on file prior to visit.     Past Medical History:  Diagnosis Date   Allergic rhinitis    GERD (gastroesophageal reflux disease)    Glaucoma    SUSPECT   Hiatal hernia    Hyperlipidemia    LDL goal = < 100   Hypothyroidism    ILD (interstitial lung disease) ([facility])    MVP (mitral valve prolapse)    documented on 2 D ECHO    Past Surgical History:  Procedure Laterality Date   APPENDECTOMY     with [facility]   negative   COLONOSCOPY  2005   Dr Olevia Perches   COLONOSCOPY  01/2013   Dr Collene Mares   esophageal dilation  07/04/2011  Dr Estrella Myrtle SIGMOIDOSCOPY  1999    Dr Olevia Perches   KNEE ARTHROSCOPY  2001, 2005   Dr Percell Miller   bilat   TOTAL ABDOMINAL HYSTERECTOMY     for Endometriosis; no BSO, Dr Colin Ina   UPPER GI ENDOSCOPY  01/2013   Dr Collene Mares    Social History   Socioeconomic History   Marital status: Married    Spouse name: Jeneen Rinks   Number of children: 2   Years of education: Not on file   Highest education level: Not on file  Occupational History   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, product/process development strain: Not hard at [employer] insecurity    Worry: Never true    Inability: Never true   Transportation needs    Medical: No    Non-medical: No  Tobacco Use   Smoking status: Never Smoker    Smokeless tobacco: Never Used  Substance and Sexual Activity   Alcohol use: Yes    Comment:  rarely   Drug use: No   Sexual activity: Yes  Lifestyle   Physical activity    Days per week: 5 days    Minutes per session: 50 min   Stress: Not at all  Relationships   Social connections    Talks on phone: More than three times a week    Gets together: More than three times a week    Attends religious service: More than 4 times per year    Active member of club or organization: Yes    Attends meetings of clubs or organizations: More than 4 times per year    Relationship status: Married  Other Topics Concern   Not on file  Social History Narrative   Not on file    Family History  Problem Relation Age of Onset   Lung cancer Father        Asbestos with mesothelioma   Heart disease Paternal Grandmother        ? etiology   Diabetes Paternal Grandmother    Diabetes Brother    Uterine cancer Paternal Aunt    Stroke Paternal Grandfather        > 66   Heart attack Brother 83   Allergies Sister    Aneurysm Mother        congenital   Stroke Brother        two strokes   Barrett's esophagus Brother     Review of Systems  Constitutional: Negative for chills and fever.  Eyes: Negative for visual disturbance.  Respiratory: Positive for cough (coughing spells every morning - coughs up sputum and then she is good) and shortness of breath (with exertion). Negative for wheezing.   Cardiovascular: Negative for chest pain (heavy with DOE), palpitations and leg swelling.  Gastrointestinal: Negative for abdominal pain, blood in stool, constipation, diarrhea and nausea.  Genitourinary: Negative for dysuria and hematuria.  Musculoskeletal: Positive for arthralgias.  Skin: Negative for color change and rash.  Neurological: Positive for dizziness (occ with sinus issues) and headaches (occasional - allergy/weather). Negative for light-headedness.  Psychiatric/Behavioral:  Negative for dysphoric mood. The patient is not nervous/anxious.        Objective:   Vitals:   07/04/18 1055  BP: 122/72  Pulse: 64  Resp: 16  Temp: 98.9 F (37.2 C)  SpO2: 99%   Filed Weights   07/04/18 1055  Weight: 145 lb (65.8 kg)   Body mass index is 23.4 kg/m.  BP Readings from Last 3 Encounters:  07/04/18 122/72  06/06/18 118/62  02/26/18 110/68    Wt Readings from Last 3 Encounters:  07/04/18 145 lb (65.8 kg)  06/06/18 150 lb 12.8 oz (68.4 kg)  02/26/18 155 lb (70.3 kg)     Physical Exam Constitutional: She appears well-developed and well-nourished. No distress.  HENT:  Head: Normocephalic and atraumatic.  Right Ear: External ear normal. Normal ear canal and TM Left Ear: External ear normal.  Normal ear canal and TM Mouth/Throat: Oropharynx is clear and moist.  Eyes: Conjunctivae and EOM are normal.  Neck: Neck supple. No tracheal deviation present. No thyromegaly present. No carotid bruit  Cardiovascular: Normal rate, regular rhythm and normal heart sounds.  No murmur heard.  No edema. Pulmonary/Chest: Effort normal and breath sounds normal. No respiratory distress. She has no wheezes. She has no rales.  Breast: deferred   Abdominal: Soft. She exhibits no distension. There is no tenderness.  Lymphadenopathy: She has no cervical adenopathy.  Skin: Skin is warm and dry. She is not diaphoretic.  Psychiatric: She has a normal mood and affect. Her behavior is normal.        Assessment & Plan:   Physical exam: Screening blood work ordered Immunizations   all up-to-date Colonoscopy -no longer needed due to age 46   scheduled for 6/24 Gyn   no longer sees Dexa    up-to-date Eye exams  Up to date  Exercise   - walking Weight    normal BMI Skin   Dry area on nose - recommended derm appt Substance abuse   none  See Problem List for Assessment and Plan of chronic medical problems.   FU in one year

## 2018-07-04 ENCOUNTER — Ambulatory Visit (INDEPENDENT_AMBULATORY_CARE_PROVIDER_SITE_OTHER): Payer: Medicare Other | Admitting: Internal Medicine

## 2018-07-04 ENCOUNTER — Other Ambulatory Visit (INDEPENDENT_AMBULATORY_CARE_PROVIDER_SITE_OTHER): Payer: Medicare Other

## 2018-07-04 ENCOUNTER — Encounter: Payer: Self-pay | Admitting: Internal Medicine

## 2018-07-04 VITALS — BP 122/72 | HR 64 | Temp 98.9°F | Resp 16 | Ht 66.0 in | Wt 145.0 lb

## 2018-07-04 DIAGNOSIS — Z0000 Encounter for general adult medical examination without abnormal findings: Secondary | ICD-10-CM

## 2018-07-04 DIAGNOSIS — R7303 Prediabetes: Secondary | ICD-10-CM | POA: Diagnosis not present

## 2018-07-04 DIAGNOSIS — K219 Gastro-esophageal reflux disease without esophagitis: Secondary | ICD-10-CM | POA: Diagnosis not present

## 2018-07-04 DIAGNOSIS — E038 Other specified hypothyroidism: Secondary | ICD-10-CM

## 2018-07-04 DIAGNOSIS — G2581 Restless legs syndrome: Secondary | ICD-10-CM

## 2018-07-04 DIAGNOSIS — E782 Mixed hyperlipidemia: Secondary | ICD-10-CM

## 2018-07-04 LAB — CBC WITH DIFFERENTIAL/PLATELET
Basophils Absolute: 0.1 10*3/uL (ref 0.0–0.1)
Basophils Relative: 0.8 % (ref 0.0–3.0)
Eosinophils Absolute: 0.2 10*3/uL (ref 0.0–0.7)
Eosinophils Relative: 2.5 % (ref 0.0–5.0)
HCT: 42.1 % (ref 36.0–46.0)
Hemoglobin: 13.9 g/dL (ref 12.0–15.0)
Lymphocytes Relative: 31.5 % (ref 12.0–46.0)
Lymphs Abs: 2.7 10*3/uL (ref 0.7–4.0)
MCHC: 33 g/dL (ref 30.0–36.0)
MCV: 89.3 fl (ref 78.0–100.0)
Monocytes Absolute: 0.7 10*3/uL (ref 0.1–1.0)
Monocytes Relative: 7.7 % (ref 3.0–12.0)
Neutro Abs: 4.9 10*3/uL (ref 1.4–7.7)
Neutrophils Relative %: 57.5 % (ref 43.0–77.0)
Platelets: 238 10*3/uL (ref 150.0–400.0)
RBC: 4.71 Mil/uL (ref 3.87–5.11)
RDW: 15 % (ref 11.5–15.5)
WBC: 8.5 10*3/uL (ref 4.0–10.5)

## 2018-07-04 LAB — COMPREHENSIVE METABOLIC PANEL
ALT: 15 U/L (ref 0–35)
AST: 20 U/L (ref 0–37)
Albumin: 4.4 g/dL (ref 3.5–5.2)
Alkaline Phosphatase: 75 U/L (ref 39–117)
BUN: 12 mg/dL (ref 6–23)
CO2: 24 mEq/L (ref 19–32)
Calcium: 9.6 mg/dL (ref 8.4–10.5)
Chloride: 106 mEq/L (ref 96–112)
Creatinine, Ser: 0.77 mg/dL (ref 0.40–1.20)
GFR: 72.65 mL/min (ref >60.00)
Glucose, Bld: 97 mg/dL (ref 70–99)
Potassium: 4.5 mEq/L (ref 3.5–5.1)
Sodium: 141 mEq/L (ref 135–145)
Total Bilirubin: 0.9 mg/dL (ref 0.2–1.2)
Total Protein: 7 g/dL (ref 6.0–8.3)

## 2018-07-04 LAB — LIPID PANEL
Cholesterol: 168 mg/dL (ref 0–200)
HDL: 52.3 mg/dL (ref >39.00)
LDL Cholesterol: 88 mg/dL (ref 0–99)
NonHDL: 115.58
Total CHOL/HDL Ratio: 3
Triglycerides: 136 mg/dL (ref 0.0–149.0)
VLDL: 27.2 mg/dL (ref 0.0–40.0)

## 2018-07-04 LAB — HEMOGLOBIN A1C: Hgb A1c MFr Bld: 6.2 % (ref 4.6–6.5)

## 2018-07-04 LAB — TSH: TSH: 0.43 u[IU]/mL (ref 0.35–4.50)

## 2018-07-04 NOTE — Assessment & Plan Note (Signed)
Taking requip 1-2 tabs at night Walking regularly Overall controlled Limits caffeine and chocolate that make it worse

## 2018-07-04 NOTE — Assessment & Plan Note (Signed)
Check a1c Low sugar / carb diet Stressed regular exercise

## 2018-07-04 NOTE — Assessment & Plan Note (Signed)
Taking nexium QD- QOD GERD controlled Continue above

## 2018-07-04 NOTE — Assessment & Plan Note (Signed)
Check lipid panel  Continue daily statin Regular exercise and healthy diet encouraged  

## 2018-07-04 NOTE — Assessment & Plan Note (Signed)
Clinically euthyroid Check tsh  Titrate med dose if needed

## 2018-07-05 ENCOUNTER — Encounter: Payer: Self-pay | Admitting: Internal Medicine

## 2018-07-08 ENCOUNTER — Other Ambulatory Visit: Payer: Self-pay | Admitting: Internal Medicine

## 2018-07-08 DIAGNOSIS — E038 Other specified hypothyroidism: Secondary | ICD-10-CM

## 2018-07-16 ENCOUNTER — Ambulatory Visit
Admission: RE | Admit: 2018-07-16 | Discharge: 2018-07-16 | Disposition: A | Discharge status: Home / Self Care | Payer: Medicare Other | Source: Ambulatory Visit | Attending: Internal Medicine | Admitting: Internal Medicine

## 2018-07-16 DIAGNOSIS — Z1231 Encounter for screening mammogram for malignant neoplasm of breast: Secondary | ICD-10-CM

## 2018-07-16 DIAGNOSIS — H04123 Dry eye syndrome of bilateral lacrimal glands: Secondary | ICD-10-CM | POA: Diagnosis not present

## 2018-07-18 ENCOUNTER — Telehealth (HOSPITAL_COMMUNITY): Payer: Self-pay | Admitting: Emergency Medicine

## 2018-07-18 NOTE — Telephone Encounter (Signed)
Reaching out to patient to offer assistance regarding upcoming cardiac imaging study; pt verbalizes understanding of appt date/time, parking situation and where to check in, pre-test NPO status and medications ordered, and verified current allergies; name and call back number provided for further questions should they arise Marchia Bond RN [facility] [phone] office [phone] cell  Pt denies covid symptoms, verbalized understanding of visitor policy.

## 2018-07-21 ENCOUNTER — Ambulatory Visit (HOSPITAL_COMMUNITY): Payer: Medicare Other

## 2018-07-21 ENCOUNTER — Ambulatory Visit (HOSPITAL_COMMUNITY)
Admission: RE | Admit: 2018-07-21 | Discharge: 2018-07-21 | Disposition: A | Discharge status: Home / Self Care | Payer: Medicare Other | Source: Ambulatory Visit | Attending: Cardiology | Admitting: Cardiology

## 2018-07-21 ENCOUNTER — Other Ambulatory Visit: Payer: Self-pay

## 2018-07-21 ENCOUNTER — Encounter (HOSPITAL_COMMUNITY): Payer: Self-pay

## 2018-07-21 DIAGNOSIS — R072 Precordial pain: Secondary | ICD-10-CM | POA: Insufficient documentation

## 2018-07-21 DIAGNOSIS — I2510 Atherosclerotic heart disease of native coronary artery without angina pectoris: Secondary | ICD-10-CM | POA: Insufficient documentation

## 2018-07-21 DIAGNOSIS — I2584 Coronary atherosclerosis due to calcified coronary lesion: Secondary | ICD-10-CM | POA: Diagnosis not present

## 2018-07-21 MED ORDER — NITROGLYCERIN 0.4 MG SL SUBL
0.8000 mg | SUBLINGUAL_TABLET | Freq: Once | SUBLINGUAL | Status: AC
  Administered 2018-07-21: 14:00:00 0.8 mg via SUBLINGUAL

## 2018-07-21 MED ORDER — NITROGLYCERIN 0.4 MG SL SUBL
SUBLINGUAL_TABLET | SUBLINGUAL | Status: AC
  Administered 2018-07-21: 0.8 mg via SUBLINGUAL
  Filled 2018-07-21: qty 2

## 2018-07-21 MED ORDER — IOHEXOL 350 MG/ML SOLN
80.0000 mL | Freq: Once | INTRAVENOUS | Status: AC | PRN
  Administered 2018-07-21: 80 mL via INTRAVENOUS

## 2018-07-21 NOTE — Progress Notes (Signed)
Pt tolerated exam without incident.  Caffeinated beverage and crackers given.  PIV removed and dressing applied.  Discharge instructions discussed with patient.  Pt discharged

## 2018-07-22 ENCOUNTER — Telehealth: Payer: Self-pay | Admitting: *Deleted

## 2018-07-22 MED ORDER — ASPIRIN EC 81 MG PO TBEC: 81.0000 mg | DELAYED_RELEASE_TABLET | Freq: Every day | ORAL | 3 refills | Status: AC

## 2018-07-22 NOTE — Telephone Encounter (Addendum)
Spoke with pt, Aware of dr Jacalyn Lefevre recommendations.   ----- Message from Lelon Perla, MD sent at 07/21/2018  4:01 PM EDT ----- Ca noted but no significant stenosis; add asa 81 mg daily Kirk Ruths

## 2018-07-28 ENCOUNTER — Other Ambulatory Visit (INDEPENDENT_AMBULATORY_CARE_PROVIDER_SITE_OTHER): Payer: Medicare Other

## 2018-07-28 DIAGNOSIS — E038 Other specified hypothyroidism: Secondary | ICD-10-CM

## 2018-07-28 LAB — TSH: TSH: 0.33 u[IU]/mL — ABNORMAL LOW (ref 0.35–4.50)

## 2018-07-29 ENCOUNTER — Encounter: Payer: Self-pay | Admitting: Internal Medicine

## 2018-07-29 MED ORDER — SYNTHROID 75 MCG PO TABS: 75.0000 ug | ORAL_TABLET | Freq: Every day | ORAL | 0 refills | Status: DC

## 2018-07-29 MED ORDER — ROPINIROLE HCL 0.5 MG PO TABS: ORAL_TABLET | ORAL | 0 refills | Status: DC

## 2018-08-26 ENCOUNTER — Other Ambulatory Visit: Payer: Self-pay

## 2018-08-26 ENCOUNTER — Ambulatory Visit (INDEPENDENT_AMBULATORY_CARE_PROVIDER_SITE_OTHER): Payer: Medicare Other | Admitting: Emergency Medicine

## 2018-08-26 ENCOUNTER — Encounter: Payer: Self-pay | Admitting: Emergency Medicine

## 2018-08-26 DIAGNOSIS — J849 Interstitial pulmonary disease, unspecified: Secondary | ICD-10-CM | POA: Diagnosis not present

## 2018-08-26 DIAGNOSIS — K219 Gastro-esophageal reflux disease without esophagitis: Secondary | ICD-10-CM | POA: Diagnosis not present

## 2018-08-26 DIAGNOSIS — J301 Allergic rhinitis due to pollen: Secondary | ICD-10-CM | POA: Diagnosis not present

## 2018-08-26 DIAGNOSIS — G2581 Restless legs syndrome: Secondary | ICD-10-CM

## 2018-08-26 NOTE — Assessment & Plan Note (Signed)
Continue same regimen

## 2018-08-26 NOTE — Assessment & Plan Note (Addendum)
Autoimmune evaluation unrevealing.  Her interstitial changes appear to be longstanding, unclear whether there is been any evidence of progression.  I do think it would be beneficial for her to be seen in the ILD clinic to talk about serial scans, possible anti-fibrotic therapy.  Your lab work from last visit did not show any evidence to support significant inflammation affecting your lungs. We will refer you to see Dr. Chase Caller in the interstitial lung disease clinic Follow with Dr. Lamonte Sakai in 12 months or sooner if you have any problems.

## 2018-08-26 NOTE — Progress Notes (Signed)
Subjective:    Patient ID: Elizabeth Espinoza, female    DOB: 11-23-1940, 78 y.o.   MRN: [id number]  HPI  ROV 02/26/18 --78 year old never smoker with a history of mitral valve prolapse whom I saw earlier this year for exertional dyspnea and chronic cough.  Pulmonary function testing consistent with restriction.  She has GERD and allergic rhinitis both of which could be contributing to her chronic cough.  I performed a high-resolution CT scan of the chest on 1/20 and reviewed today, this shows some patchy confluent subpleural reticular disease and groundglass attenuation with some minimal traction bronchiectasis and no frank honeycomb change.  This reflects a progression compared with 2014 and is an NSIP pattern.  Walking oximetry at her last visit did not show any exertional desaturation. She is still coughing, is using fexofenadine, takes nexium qod.   [place] had [place] exposure.   ROV 08/26/2018 --follow-up visit for 78 year old woman with a history of mitral valve prolapse, chronic cough, dyspnea with restrictive lung disease noted on pulmonary function testing.  High Res CT scan of the chest shows some mild interstitial disease.  She was also having cough - better with flonase; still on nexium, allegra. Her exertional tolerance is improved, she has been exercising more. She remains on Requip for RLS.   We performed autoimmune labs last time, ANA positive at low titer (1: 80), SSA and SSB negative, RNP negative, RF negative, CCP negative, Anti-Smith negative, anti-SCL negative, DS DNA negative, anti-Jo negative, aldolase negative. She hasn't been seen in ILD clinic yet.     Review of Systems  Constitutional: Negative for fever and unexpected weight change.  HENT: Positive for congestion and sinus pressure. Negative for dental problem, ear pain, nosebleeds, postnasal drip, rhinorrhea, sneezing, sore throat and trouble swallowing.   Eyes: Negative for redness and itching.   Respiratory: Positive for shortness of breath. Negative for cough, chest tightness and wheezing.   Cardiovascular: Negative for palpitations and leg swelling.  Gastrointestinal: Negative for nausea and vomiting.  Genitourinary: Negative for dysuria.  Musculoskeletal: Negative for joint swelling.  Skin: Negative for rash.  Neurological: Negative for headaches.  Hematological: Does not bruise/bleed easily.  Psychiatric/Behavioral: Negative for dysphoric mood. The patient is not nervous/anxious.     Past Medical History:  Diagnosis Date  . Allergic rhinitis   . GERD (gastroesophageal reflux disease)   . Glaucoma    SUSPECT  . Hiatal hernia   . Hyperlipidemia    LDL goal = < 100  . Hypothyroidism   . ILD (interstitial lung disease) ([place])   . MVP (mitral valve prolapse)    documented on 2 D ECHO     Family History  Problem Relation Age of Onset  . Lung cancer Father        Asbestos with mesothelioma  . Heart disease Paternal Grandmother        ? etiology  . Diabetes Paternal Grandmother   . Diabetes Brother   . Uterine cancer Paternal Aunt   . Stroke Paternal Grandfather        > 3  . Heart attack Brother 89  . Allergies Sister   . Aneurysm Mother        congenital  . Stroke Brother        two strokes  . Barrett's esophagus Brother      Social History   Socioeconomic History  . Marital status: Married    Spouse name: Jeneen Rinks  . Number  of children: 2  . Years of education: Not on file  . Highest education level: Not on file  Occupational History  . 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 Financial resource strain: Not hard at all  . Food insecurity    Worry: Never true    Inability: Never true  . Transportation needs    Medical: No    Non-medical: No  Tobacco Use  . Smoking status: Never Smoker  . Smokeless tobacco: Never Used  Substance and Sexual Activity  . Alcohol use: Yes    Comment:  rarely  . Drug use: No  . Sexual  activity: Yes  Lifestyle  . Physical activity    Days per week: 5 days    Minutes per session: 50 min  . Stress: Not at all  Relationships  . Social connections    Talks on phone: More than three times a week    Gets together: More than three times a week    Attends religious service: More than 4 times per year    Active member of club or organization: Yes    Attends meetings of clubs or organizations: More than 4 times per year    Relationship status: Married  . Intimate partner violence    Fear of current or ex partner: No    Emotionally abused: No    Physically abused: No    Forced sexual activity: No  Other Topics Concern  . Not on file  Social History Narrative  . Not on file     Allergies  Allergen Reactions  . Caffeine     palpitations  . Chlorpheniramine-Pseudoeph     palpitations  . Maxifed     palpitations     Outpatient Medications Prior to Visit  Medication Sig Dispense Refill  . aspirin EC 81 MG tablet Take 1 tablet (81 mg total) by mouth daily. 90 tablet 3  . Biotin 1000 MCG tablet Take 1,000 mcg by mouth daily.    . Cholecalciferol (VITAMIN D-3 PO) Take by mouth daily.    Marland Kitchen esomeprazole (NEXIUM) 20 MG capsule Take 1 capsule (20 mg total) by mouth daily at 12 noon. (Patient taking differently: Take 20 mg by mouth daily as needed. )    . Fexofenadine HCl (ALLEGRA PO) Take 1 tablet by mouth daily.     Marland Kitchen MAGNESIUM CARBONATE PO Take 1 tablet by mouth daily.     . metoprolol tartrate (LOPRESSOR) 50 MG tablet TAKE 2 HOURS PRIOR TO CT SCAN (Patient taking differently: TAKE 2 HOURS PRIOR TO CT SCAN) 1 tablet 0  . pravastatin (PRAVACHOL) 40 MG tablet TAKE 1 TABLET BY MOUTH  DAILY 90 tablet 1  . Probiotic Product (PROBIOTIC DAILY PO) Take 1 capsule by mouth daily as needed. ULTRA FLORA     . rOPINIRole (REQUIP) 0.5 MG tablet TAKE 2 TABLETS BY MOUTH  AFTER DINNER EACH NIGHT. 180 tablet 0  . SYNTHROID 75 MCG tablet Take 1 tablet (75 mcg total) by mouth daily before  breakfast. 90 tablet 0   No facility-administered medications prior to visit.         Objective:   Physical Exam  Vitals:   08/26/18 1128  BP: 104/80  Pulse: 77  SpO2: 97%  Weight: 150 lb (68 kg)  Height: _0  (1.676 m)   Gen: Pleasant, well-nourished, in no distress,  normal affect  ENT: No lesions,  mouth clear,  oropharynx clear, no postnasal drip  Neck: No  JVD, no stridor  Lungs: No use of accessory muscles, clear, no wheeze, few B insp basilar crackles  Cardiovascular: Regular, History of MVP, no M  Musculoskeletal: No deformities, no cyanosis or clubbing  Neuro: alert, non focal  Skin: Warm, no lesions or rash       Assessment & Plan:  RLS (restless legs syndrome) Benefited from low-dose Requip plan to continue  Interstitial lung disease ([place]) Autoimmune evaluation unrevealing.  Her interstitial changes appear to be longstanding, unclear whether there is been any evidence of progression.  I do think it would be beneficial for her to be seen in the ILD clinic to talk about serial scans, possible anti-fibrotic therapy.  Your lab work from last visit did not show any evidence to support significant inflammation affecting your lungs. We will refer you to see Dr. Chase Caller in the interstitial lung disease clinic Follow with Dr. Lamonte Sakai in 12 months or sooner if you have any problems.  Allergic rhinitis Continue same regimen  GASTROESOPHAGEAL REFLUX DISEASE, CHRONIC Continue same regimen  Baltazar Apo, MD, PhD 08/26/2018, 11:50 AM [facility] [phone] or if no answer [phone]

## 2018-08-26 NOTE — Patient Instructions (Addendum)
Congratulations on increasing your exercise.  I am glad that it is helping your breathing. Your lab work from last visit did not show any evidence to support significant inflammation affecting your lungs. Please continue your Requip as you have been taking it. Agree with continuing Flonase, Nexium, Allegra as you have been using them. We will refer you to see Dr. Chase Caller in the interstitial lung disease clinic Follow with Dr. Lamonte Sakai in 12 months or sooner if you have any problems.

## 2018-08-26 NOTE — Assessment & Plan Note (Signed)
Continue same regimen 

## 2018-08-26 NOTE — Assessment & Plan Note (Signed)
Benefited from low-dose Requip plan to continue

## 2018-09-03 ENCOUNTER — Encounter: Payer: Self-pay | Admitting: *Deleted

## 2018-09-16 ENCOUNTER — Other Ambulatory Visit: Payer: Self-pay | Admitting: Internal Medicine

## 2018-09-24 DIAGNOSIS — L570 Actinic keratosis: Secondary | ICD-10-CM | POA: Diagnosis not present

## 2018-09-24 DIAGNOSIS — H04123 Dry eye syndrome of bilateral lacrimal glands: Secondary | ICD-10-CM | POA: Diagnosis not present

## 2018-09-24 DIAGNOSIS — L814 Other melanin hyperpigmentation: Secondary | ICD-10-CM | POA: Diagnosis not present

## 2018-09-24 DIAGNOSIS — L918 Other hypertrophic disorders of the skin: Secondary | ICD-10-CM | POA: Diagnosis not present

## 2018-09-24 DIAGNOSIS — H5712 Ocular pain, left eye: Secondary | ICD-10-CM | POA: Diagnosis not present

## 2018-09-24 DIAGNOSIS — D225 Melanocytic nevi of trunk: Secondary | ICD-10-CM | POA: Diagnosis not present

## 2018-09-24 DIAGNOSIS — L820 Inflamed seborrheic keratosis: Secondary | ICD-10-CM | POA: Diagnosis not present

## 2018-09-30 ENCOUNTER — Encounter: Payer: Self-pay | Admitting: Internal Medicine

## 2018-09-30 ENCOUNTER — Other Ambulatory Visit (INDEPENDENT_AMBULATORY_CARE_PROVIDER_SITE_OTHER): Payer: Medicare Other

## 2018-09-30 ENCOUNTER — Other Ambulatory Visit: Payer: Self-pay

## 2018-09-30 DIAGNOSIS — E038 Other specified hypothyroidism: Secondary | ICD-10-CM

## 2018-09-30 LAB — TSH: TSH: 1.48 u[IU]/mL (ref 0.35–4.50)

## 2018-10-07 ENCOUNTER — Ambulatory Visit: Payer: Medicare Other | Admitting: Cardiology

## 2018-10-08 ENCOUNTER — Other Ambulatory Visit: Payer: Self-pay

## 2018-10-08 ENCOUNTER — Telehealth: Payer: Self-pay | Admitting: Internal Medicine

## 2018-10-08 ENCOUNTER — Ambulatory Visit (INDEPENDENT_AMBULATORY_CARE_PROVIDER_SITE_OTHER): Payer: Medicare Other | Admitting: Internal Medicine

## 2018-10-08 ENCOUNTER — Encounter: Payer: Self-pay | Admitting: Internal Medicine

## 2018-10-08 VITALS — BP 118/68 | HR 78 | Temp 97.4°F | Wt 151.2 lb

## 2018-10-08 DIAGNOSIS — J849 Interstitial pulmonary disease, unspecified: Secondary | ICD-10-CM

## 2018-10-08 NOTE — Progress Notes (Signed)
IOV 12/04/2017: Dr Lamonte Sakai: Elizabeth Espinoza is a 78 year old never smoker with a history of allergic rhinitis, GERD, hypothyroidism, MVP.  She is been seen in our office in the past by Dr. Gwenette Greet for restless leg syndrome-she still treated for this with Requip. On allegra, nexium prn.   She is referred today for dyspnea.  She reports that she has had an exercise routine where she walked w her husband 2 miles, exercises at the [employer] [facility] [employer], for several years. She has noticed more SOB, especially with mild hills. Sometimes now has to stop briefly to complete the walk. Occasional chest tightness, no wheeze. No overt CP. She reports daily cough, often in the am, sometimes clear mucous. She is on allegra. She had a walking stress test 09/2014 > reassuring ECG w exercise. Weight has been stable. Her last TSH was 06/25/17, 0.73.   Review of her notes shows hx possible GGI on prior CT 2014.   ROV 02/26/18 [name] --78 year old never smoker with a history of mitral valve prolapse whom I saw earlier this year for exertional dyspnea and chronic cough.  Pulmonary function testing consistent with restriction.  She has GERD and allergic rhinitis both of which could be contributing to her chronic cough.  I performed a high-resolution CT scan of the chest on 1/20 and reviewed today, this shows some patchy confluent subpleural reticular disease and groundglass attenuation with some minimal traction bronchiectasis and no frank honeycomb change.  This reflects a progression compared with 2014 and is an NSIP pattern.  Walking oximetry at her last visit did not show any exertional desaturation. She is still coughing, is using fexofenadine, takes nexium qod.   [place], had [place] exposure.   ROV 08/26/2018 [name] --follow-up visit for 78 year old woman with a history of mitral valve prolapse, chronic cough, dyspnea with restrictive lung disease noted on pulmonary function testing.  High Res CT scan of the chest  shows some mild interstitial disease.  She was also having cough - better with flonase; still on nexium, allegra. Her exertional tolerance is improved, she has been exercising more. She remains on Requip for RLS.   We performed autoimmune labs last time, ANA positive at low titer (1: 80), SSA and SSB negative, RNP negative, RF negative, CCP negative, Anti-Smith negative, anti-SCL negative, DS DNA negative, anti-Jo negative, aldolase negative. She hasn't been seen in ILD clinic yet.    OV 10/08/2018  Subjective:  Patient ID: Elizabeth Espinoza, female , DOB: 06-23-1940 , age 80 y.o. , MRN: [id number] , ADDRESS: [place] [address]   10/08/2018 -   Chief Complaint  Patient presents with   Interstitial Lung Disease    Breathing the same as it was during August 2020 office visit with Dr. Lamonte Sakai     HPI Elizabeth Espinoza 78 y.o. -has been referred to the interstitial lung disease clinic because of findings of interstitial lung disease.  History is gathered from talking to her, review of Dr. Collene Gobble notes and also the integrated ILD questionnaire.  Briefly, she tells me that she was working out at [employer] and would just notice occasional dyspnea but she really did not compare it with other people.  Then in October 2019 she started walking 3 miles daily except on the days it rains with a husband.  During this time she noticed that she was falling really behind because of shortness of breath and exertional fatigue.  This resulted in subsequent evaluation all documented above.  Findings of interstitial lung disease with subpleural reticulation suggestive of an alternative diagnosis.  Autoimmune profile essentially negative other than trace positive ANA.  She tells me that her significant major problem is exertional dyspnea when walking stairs or walking several miles.  She did not desaturate in our office several months ago.  She does not know if she desaturates when she exerts  walking 3 miles.  Pulmonary function test earlier this year is just isolated low DLCO.  She also has like a cough.  Overall since the onset of the symptoms by exercising herself more and conditioning she is somewhat better.  She did see Dr. Kirk Ruths in May 2020 and in June 2020 had a coronary calcium CT which appears to have no calcium deposits.   [place] Integrated Comprehensive ILD Questionnaire  Symptoms:  -Dyspnea started suddenly and since it started it is better.  She says it is been present for years although she did tell me that it is only there since October 2019 when she noticed that.  Severity is listed below.  She does have a cough almost 1 year.  Since it started it is better.  It is mostly in the morning.  She does bring up some phlegm.  Early on in the morning it is green or yellow.  Since it started it is the same/better.  There is no wheezing.  She does have some chest tightness with this when she walks.  It is relieved by rest.  Cardiac work-up in June 2020 showed no calcium deposits.   Past Medical History : Positive for chronic longstanding acid reflux disease and thyroid disease..  In addition CT scan from January 2020 shows hiatal hernia that is small..  This presence of  sclerosis in the bony structures in January 2020.-Primary care physician has been sent a message today.  There is no asthma or COPD or heart failure rheumatoid arthritis or collagen vascular disease.  She does have GERD and hiatal hernia for several years to decades.  No sleep apnea.  No blood clots.  No hepatitis.  No tuberculosis.  No pleurisy.   ROS:  -She does have fatigue for the last several years.  She does have some back and hip issues.  She does have dry eyes.  She does have like some dysphagia.  There is presence of hiatal hernia-there is a small.  Acid reflux for several decades.  She also reports presence of nonspecific rash   FAMILY HISTORY of LUNG DISEASE: * -Her father died of mesothelioma in  nineteen 83/1984 at the age of 69 otherwise no lung disease.   EXPOSURE HISTORY:   -When she was 17 she smokes cigarettes but otherwise no cigarette or tobacco use or electronic cigarette.  Never smoked marijuana.  No cocaine use no intravenous drug use.   HOME and HOBBY DETAILS :  -Single[place]family home [place] setting for the last 16 years in a 78 year old home.  No mold or mildew exposure in the [facility] duct or CPAP mask or humidifier.  No mold or mildew in the bathroom.  No pet birds in the house.  No misting [place].  No feather pillows no feather duvet.  No musical instruments.  She does some occasional gardening which she likes.  She does do some fine-needle work.   OCCUPATIONAL HISTORY (122 questions) :  = Essentially negative except for the fact when she was a child she did some tobacco growing.  She has  done home gardening for 50 years.    PULMONARY TOXICITY HISTORY (27 items):  denies       SYMPTOM SCALE - ILD dermatitis   O2 use 10/08/2018   Shortness of Breath 0 -> 5 scale with 5 being worst (score 6 If unable to do)  At rest 0  Simple tasks - showers, clothes change, eating, shaving 1  Household (dishes, doing bed, laundry) 2  Shopping 2  Walking level at own pace 2  Walking keeping up with others of same age 21  Walking up Stairs 4  Walking up Hill 5  Total (40 - 48) Dyspnea Score 20  How bad is your cough? 1  How bad is your fatigue 2     Simple office walk 185 feet x  3 laps goal with forehead probe 10/08/2018   O2 used ra  Number laps completed 3  Comments about pace good  Resting Pulse Ox/HR 98% and 77/min  Final Pulse Ox/HR 95% and 96/min  Desaturated </= 88% no  Desaturated <= 3% points yes  Got Tachycardic >/= 90/min yes  Symptoms at end of test none  Miscellaneous comments x     Results for SIMREN, POPSON (MRN [id number]) as of 10/08/2018 10:56  Ref. Range 07/04/2018 11:48  Hemoglobin Latest Ref Range: 12.0 - 15.0 g/dL 13.9   Results for DANITY, SCHMELZER (MRN [id number]) as of 10/08/2018 10:56  Ref. Range 07/04/2018 11:48  Creatinine Latest Ref Range: 0.40 - 1.20 mg/dL 0.77     Lungs/Pleura: No pneumothorax. No prior right breath for walk test okay we need competence walk [place] pleural effusion. Two tiny subpleural solid pulmonary nodules along the fissures, largest 4 mm along the minor fissure (series 5/image 76), both stable since 12/12/2012 chest CT and considered benign no acute consolidative airspace disease, lung masses or new significant pulmonary nodules. There is patchy confluent subpleural reticulation and ground-glass attenuation throughout both lungs with associated minimal traction bronchiolectasis and minimal architectural distortion. No frank honeycombing. No significant air trapping on the expiration sequence. No apical basilar gradient. The subpleural reticulation is minimally increased since 2014. The ground-glass component has decreased since 2014.  Upper abdomen: Small hiatal hernia.  Musculoskeletal: No aggressive appearing focal osseous lesions. Mild patchy sclerosis throughout the osseous structures is not appreciably changed. Moderate thoracic spondylosis.  IMPRESSION: 1. Spectrum of findings compatible with fibrotic interstitial lung disease without apicobasilar gradient and without honeycombing. Mild mixed changes in the pattern since 2014 as detailed. 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 Findings are suggestive of an alternative diagnosis (not UIP) per consensus guidelines: Diagnosis of Idiopathic Pulmonary Fibrosis: An Official ATS/ERS/JRS/ALAT Clinical Practice Guideline. [place], Iss 5, [phone], Sep 22 2016. 2. One vessel coronary atherosclerosis. 3. Small hiatal hernia.   4. Nonspecific chronic mild patchy sclerosis throughout the osseous structures, can not exclude a chronic infiltrative marrow  condition such as myelofibrosis.  Aortic Atherosclerosis (ICD10-I70.0).   Electronically Signed   By: Ilona Sorrel M.D.   On: 02/10/2018 14:23   Results for KYLIAH, DEANDA (MRN [id number]) as of 10/08/2018 10:56  Ref. Range 01/29/2018 14:31  FVC-Pre Latest Units: L 2.97  FVC-%Pred-Pre Latest Units: % 98  FEV1-Pre Latest Units: L 2.40  FEV1-%Pred-Pre Latest Units: % 105  Pre FEV1/FVC ratio Latest Units: % 81   Results for CAITLAIN, TWEED (MRN [id number]) as of 10/08/2018 10:56  Ref. Range 01/29/2018 14:31  DLCO unc  Latest Units: ml/min/mmHg 14.80  DLCO unc % pred Latest Units: % 54  Results for NOVIS, LEAGUE (MRN [id number]) as of 10/08/2018 10:56  Ref. Range 09/01/2008 00:00 02/26/2018 12:17  Anti Nuclear Antibody (ANA) Latest Ref Range: NEGATIVE   POSITIVE (A)  ANA Pattern 1 Unknown  Nuclear, Speckled (A)  ANA Titer 1 Latest Units: titer  1:80 (H)  Anti JO-1 Latest Ref Range: 0.0 - 0.9 AI  <7.5  Cyclic Citrullin Peptide Ab Latest Units: UNITS  <16  ds DNA Ab Latest Units: IU/mL  <1  RA Latex Turbid. Latest Ref Range: <14 IU/mL <20.0 IU/ml <14  ENA SM Ab Ser-aCnc Latest Ref Range: <1.0 NEG AI  <1.0 NEG  Ribonucleic Protein(ENA) Antibody, IgG Latest Ref Range: <1.0 NEG AI  <1.0 NEG  SSA (Ro) (ENA) Antibody, IgG Latest Ref Range: <1.0 NEG AI  <1.0 NEG  SSB (La) (ENA) Antibody, IgG Latest Ref Range: <1.0 NEG AI  <1.0 NEG  Scleroderma (Scl-70) (ENA) Antibody, IgG Latest Ref Range: <1.0 NEG AI  <1.0 NEG   ROS - per HPI     has a past medical history of Allergic rhinitis, GERD (gastroesophageal reflux disease), Glaucoma, Hiatal hernia, Hyperlipidemia, Hypothyroidism, ILD (interstitial lung disease) ([place]), and MVP (mitral valve prolapse).   reports that she has never smoked. She has never used smokeless tobacco.  Past Surgical History:  Procedure Laterality Date   APPENDECTOMY     with Sombrillo   negative   COLONOSCOPY  2005   Dr Olevia Perches    COLONOSCOPY  01/2013   Dr Collene Mares   esophageal dilation  07/04/2011   Dr Estrella Myrtle SIGMOIDOSCOPY  1999    Dr Olevia Perches   KNEE ARTHROSCOPY  2001, 2005   Dr Percell Miller   bilat   TOTAL ABDOMINAL HYSTERECTOMY     for Endometriosis; no BSO, Dr Colin Ina   UPPER GI ENDOSCOPY  01/2013   Dr Collene Mares    Allergies  Allergen Reactions   Caffeine     palpitations   Chlorpheniramine-Pseudoeph     palpitations   Maxifed     palpitations    Immunization History  Administered Date(s) Administered   Influenza Split 11/07/2010   Influenza Whole 10/30/2009, 11/27/2011   Influenza, High Dose Seasonal PF 10/27/2012, 11/05/2013, 10/01/2017, 09/11/2018   Influenza-Unspecified 11/24/2014, 10/07/2015, 10/08/2016   Pneumococcal Conjugate-13 10/07/2015   Pneumococcal Polysaccharide-23 07/02/2017   Tdap 02/11/2012   Zoster 01/09/2012   Zoster Recombinat (Shingrix) 10/01/2017, 12/03/2017    Family History  Problem Relation Age of Onset   Lung cancer Father        Asbestos with mesothelioma   Heart disease Paternal Grandmother        ? etiology   Diabetes Paternal Grandmother    Diabetes Brother    Uterine cancer Paternal Aunt    Stroke Paternal Grandfather        > 41   Heart attack Brother 75   Allergies Sister    Aneurysm Mother        congenital   Stroke Brother        two strokes   Barrett's esophagus Brother      Current Outpatient Medications:    aspirin EC 81 MG tablet, Take 1 tablet (81 mg total) by mouth daily., Disp: 90 tablet, Rfl: 3   Biotin 1000 MCG tablet, Take 1,000 mcg by mouth daily., Disp: , Rfl:    Cholecalciferol (VITAMIN D-3 PO), Take by mouth daily.,  Disp: , Rfl:    esomeprazole (NEXIUM) 20 MG capsule, Take 1 capsule (20 mg total) by mouth daily at 12 noon. (Patient taking differently: Take 20 mg by mouth daily as needed. ), Disp: , Rfl:    Fexofenadine HCl (ALLEGRA PO), Take 1 tablet by mouth daily. , Disp: , Rfl:    MAGNESIUM  CARBONATE PO, Take 1 tablet by mouth daily. , Disp: , Rfl:    metoprolol tartrate (LOPRESSOR) 50 MG tablet, TAKE 2 HOURS PRIOR TO CT SCAN (Patient taking differently: TAKE 2 HOURS PRIOR TO CT SCAN), Disp: 1 tablet, Rfl: 0   pravastatin (PRAVACHOL) 40 MG tablet, TAKE 1 TABLET BY MOUTH  DAILY, Disp: 90 tablet, Rfl: 1   Probiotic Product (PROBIOTIC DAILY PO), Take 1 capsule by mouth daily as needed. ULTRA FLORA , Disp: , Rfl:    rOPINIRole (REQUIP) 0.5 MG tablet, TAKE 2 TABLETS BY MOUTH  AFTER DINNER EACH NIGHT., Disp: 180 tablet, Rfl: 2   SYNTHROID 75 MCG tablet, TAKE 1 TABLET BY MOUTH  DAILY BEFORE BREAKFAST, Disp: 90 tablet, Rfl: 2      Objective:   Vitals:   10/08/18 1042  BP: 118/68  Pulse: 78  Temp: (!) 97.4 F (36.3 C)  SpO2: 98%  Weight: 151 lb 3.2 oz (68.6 kg)    Estimated body mass index is 24.4 kg/m as calculated from the following:   Height as of 08/26/18: _0  (1.676 m).   Weight as of this encounter: 151 lb 3.2 oz (68.6 kg).  _1 @  Filed Weights   10/08/18 1042  Weight: 151 lb 3.2 oz (68.6 kg)     Physical Exam  General Appearance:    Alert, cooperative, no distress, appears stated age - yes , Deconditioned looking - no , OBESE  - no, Sitting on Wheelchair -  no  Head:    Normocephalic, without obvious abnormality, atraumatic  Eyes:    PERRL, conjunctiva/corneas clear,  Ears:    Normal TM's and external ear canals, both ears  Nose:   Nares normal, septum midline, mucosa normal, no drainage    or sinus tenderness. OXYGEN ON  - no . Patient is @ ra   Throat:   Lips, mucosa, and tongue normal; teeth and gums normal. Cyanosis on lips - no  Neck:   Supple, symmetrical, trachea midline, no adenopathy;    thyroid:  no enlargement/tenderness/nodules; no carotid   bruit or JVD  Back:     Symmetric, no curvature, ROM normal, no CVA tenderness  Lungs:     Distress - no , Wheeze no, Barrell Chest - no, Purse lip breathing - no, Crackles - yes at base, mild,  faint   Chest Wall:    No tenderness or deformity.    Heart:    Regular rate and rhythm, S1 and S2 normal, no rub   or gallop, Murmur - no  Breast Exam:    NOT DONE  Abdomen:     Soft, non-tender, bowel sounds active all four quadrants,    no masses, no organomegaly. Visceral obesity - no  Genitalia:   NOT DONE  Rectal:   NOT DONE  Extremities:   Extremities - normal, Has Cane - no, Clubbing - no, Edema - no  Pulses:   2+ and symmetric all extremities  Skin:   Stigmata of Connective Tissue Disease - STIGMATA of CONNECTIVE TISSUE DISEASE  - Distal digital fissuring (ie, "mechanic hands") - no - Distal digital tip ulceration - no -Inflammatory arthritis  or polyarticular morning joint stiffness ?60 minutes - no - Palmar telangiectasia - no - Raynaud phenomenon - no - Unexplained digital edema - no - Unexplained fixed rash on the digital extensor surfaces (Gottron's sign) - no ... - Deformities of RA - no - Scleroderma  - no - Malar Rash -  no   Lymph nodes:   Cervical, supraclavicular, and axillary nodes normal  Psychiatric:  Neurologic:   Pleasant - yes, Anxious - no, Flat affect - no  CAm-ICU - neg, Alert and Oriented x 3 - yes, Moves all 4s - yes, Speech - normal, Cognition - intact           Assessment:       ICD-10-CM   1. Interstitial lung disease (HCC)  J84.9 CT Chest High Resolution    Pulmonary function test  2. Interstitial pulmonary disease (HCC)  J84.9 CT Chest High Resolution    Pulmonary function test  She has chronic interstitial lung disease at least for 6 years with minimal progression between 2014 and January 2020.  Since then she is stable clinically.  Her risk factors appears to be mainly acid reflux disease based on questioning history.  Autoimmune serologies essentially negative.  She has done gardening as a hobby but is unclear if she has had any organic antigen exposure with this.  Given the alternative diagnosis nature of the CT scan and not being  predicted for UIP she does meet criteria for lung biopsy.  However she is somewhat reluctant.  I would consider her as low risk for complications from surgical lung biopsy.  We discussed this.  We resolved that given the fact that it is been at least 9 months since she had a CT chest and pulmonary function test we will repeat this at the 10-46-monthmark and then have a discussion.  It might be worthwhile for her to get a GI opinion but we can decide this at follow-up.     In terms of bony sclerosis noted I have informed her and sent a message to her primary care physician.     Plan:     Patient Instructions     ICD-10-CM   1. Interstitial lung disease ([place]  J84.9    - You have ed you  have Interstitial Lung Disease (ILD) aka Pulmonry Fibrosis  -  There are MANY varieties of this - - Despite CT chest and PFT in jan 2020 and blood work aug 2020 and detailed questionnaire today we have not been able to narrrow down the variety/cause  Plan   - do HRCT in 4-6 weeks supine and prone  - do spirometry/dlco in 4-6 weeks  - monitor your oxygen level with long walks -call [place]if goes below 88% - flu shot when able  - continue low risk to covid activities. Avoid increased risk  Followup - 30 min slot in 4-6 weeks but after tests aboe  - based on above might have to consider lung biopsy (bronch method or surgical) + / - GI referral   > 50% of this > 40 min visit spent in face to face counseling or/and coordination of care - by this undersigned MD - Dr MBrand Males This includes one or more of the following documented above: discussion of test results, diagnostic or treatment recommendations, prognosis, risks and benefits of management options, instructions, education, compliance or risk-factor reduction   SIGNATURE    Dr. MBrand Males M.D., F.C.C.P,  Pulmonary and Critical Care Medicine Staff Physician,  [place] Director - Interstitial Lung Disease  Program    Pulmonary [place] at [place], [place], [address]  Pager: [phone], If no answer or between  15:00h - 7:00h: call [phone] Telephone: [phone]  12:53 PM 10/08/2018

## 2018-10-08 NOTE — Telephone Encounter (Signed)
Dear Dr Quay Burow  I saw Elizabeth Espinoza at pulmonary clinic for interstitial lung disease today.  In review of her CT scan of the chest from January 2020 I noticed there is a report of sclerotic bone lesions.  Her CBC itself appears normal.  I am repeating a CT chest in the next 4-6 weeks.  However, I thought I would alert you to this.  I have *yes* disclosed this to the patient at this visit and informed her to followup with you.   Hope is something you can followup on  Thanks  SIGNATURE    Dr. Brand Males, M.D., F.C.C.P,  Pulmonary and Critical Care Medicine Staff Physician, [place] Director - Interstitial Lung Disease  Program  Pulmonary [place] at [place], [place], [address]  Pager: [phone], If no answer or between  15:00h - 7:00h: call [phone] Telephone: [phone]  12:40 PM 10/08/2018

## 2018-10-08 NOTE — Patient Instructions (Addendum)
ICD-10-CM   1. Interstitial lung disease ([place])  J84.9    - You have Interstitial Lung Disease (ILD) aka Pulmonry Fibrosis  -  There are MANY varieties of this - - Despite CT chest and PFT in jan 2020 and blood work aug 2020 and detailed questionnaire today we have not been able to narrrow down the variety/cause  Plan   - do HRCT in 4-6 weeks supine and prone  - do spirometry/dlco in 4-6 weeks  - monitor your oxygen level with long walks -call us if goes below 88% - flu shot when able  - continue low risk to covid activities. Avoid increased risk  Followup - 30 min slot in 4-6 weeks but after tests aboe  - based on above might have to consider lung biopsy (bronch method or surgical) + / - GI referral

## 2018-10-20 NOTE — Progress Notes (Signed)
Subjective:    Patient ID: Elizabeth Espinoza, female    DOB: 04/15/1940, 78 y.o.   MRN: [id number]  HPI The patient is here for an acute visit.   Sclerotic bone lesions:  She had a recent Ct scan ( 01/2018) that showed non specific chronic mild patchy sclerosis throughout the osseous structures, can not exclude a chronic infiltrative marrow condition such as myelofibrosis.  Pulmonary advised she come here for further evaluation.  Last blood work was done in June and was normal.    Medications and allergies reviewed with patient and updated if appropriate.  Patient Active Problem List   Diagnosis Date Noted  . Chronic cough 01/29/2018  . Rash and nonspecific skin eruption 07/30/2016  . Prediabetes 02/24/2015  . Dyspnea on exertion 09/29/2014  . Interstitial lung disease ([place]) 12/11/2012  . Barrett's esophagus 12/11/2012  . RLS (restless legs syndrome) 04/13/2011  . 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 ARTHRALGIA 09/01/2008  . HIATAL HERNIA 07/31/2007  . History of cardiovascular disorder 07/31/2007  . Allergic rhinitis 06/19/2007  . GASTROESOPHAGEAL REFLUX DISEASE, CHRONIC 06/19/2007  . Hypothyroidism 02/07/2007  . HYPERLIPIDEMIA 02/07/2007    Current Outpatient Medications on File Prior to Visit  Medication Sig Dispense Refill  . aspirin EC 81 MG tablet Take 1 tablet (81 mg total) by mouth daily. 90 tablet 3  . Biotin 1000 MCG tablet Take 1,000 mcg by mouth daily.    . Cholecalciferol (VITAMIN D-3 PO) Take by mouth daily.    Marland Kitchen esomeprazole (NEXIUM) 20 MG capsule Take 1 capsule (20 mg total) by mouth daily at 12 noon. (Patient taking differently: Take 20 mg by mouth daily as needed. )    . Fexofenadine HCl (ALLEGRA PO) Take 1 tablet by mouth daily.     Marland Kitchen MAGNESIUM CARBONATE PO Take 1 tablet by mouth daily.     . metoprolol tartrate (LOPRESSOR) 50 MG tablet TAKE 2 HOURS PRIOR TO CT SCAN (Patient taking differently: TAKE 2 HOURS PRIOR TO CT SCAN)  1 tablet 0  . pravastatin (PRAVACHOL) 40 MG tablet TAKE 1 TABLET BY MOUTH  DAILY 90 tablet 1  . Probiotic Product (PROBIOTIC DAILY PO) Take 1 capsule by mouth daily as needed. ULTRA FLORA     . rOPINIRole (REQUIP) 0.5 MG tablet TAKE 2 TABLETS BY MOUTH  AFTER DINNER EACH NIGHT. 180 tablet 2  . SYNTHROID 75 MCG tablet TAKE 1 TABLET BY MOUTH  DAILY BEFORE BREAKFAST 90 tablet 2  . [DISCONTINUED] loratadine (CLARITIN) 10 MG tablet Take 10 mg by mouth daily as needed.       No current facility-administered medications on file prior to visit.     Past Medical History:  Diagnosis Date  . Allergic rhinitis   . GERD (gastroesophageal reflux disease)   . Glaucoma    SUSPECT  . Hiatal hernia   . Hyperlipidemia    LDL goal = < 100  . Hypothyroidism   . ILD (interstitial lung disease) ([place])   . MVP (mitral valve prolapse)    documented on 2 D ECHO    Past Surgical History:  Procedure Laterality Date  . APPENDECTOMY     with TAH  . CARDIAC CATHETERIZATION  1989   negative  . COLONOSCOPY  2005   Dr Olevia Perches  . COLONOSCOPY  01/2013   Dr Collene Mares  . esophageal dilation  07/04/2011   Dr Olevia Perches  . FLEXIBLE SIGMOIDOSCOPY  1999    Dr Olevia Perches  . KNEE  ARTHROSCOPY  2001, 2005   Dr Percell Miller   bilat  . TOTAL ABDOMINAL HYSTERECTOMY     for Endometriosis; no BSO, Dr Colin Ina  . UPPER GI ENDOSCOPY  01/2013   Dr Collene Mares    Social History   Socioeconomic History  . Marital status: Married    Spouse name: Jeneen Rinks  . Number of children: 2  . Years of education: Not on file  . Highest education level: Not on file  Occupational History  . 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 Financial resource strain: Not hard at all  . Food insecurity    Worry: Never true    Inability: Never true  . Transportation needs    Medical: No    Non-medical: No  Tobacco Use  . Smoking status: Never Smoker  . Smokeless tobacco: Never Used  Substance and Sexual Activity  . Alcohol use: Yes     Comment:  rarely  . Drug use: No  . Sexual activity: Yes  Lifestyle  . Physical activity    Days per week: 5 days    Minutes per session: 50 min  . Stress: Not at all  Relationships  . Social connections    Talks on phone: More than three times a week    Gets together: More than three times a week    Attends religious service: More than 4 times per year    Active member of club or organization: Yes    Attends meetings of clubs or organizations: More than 4 times per year    Relationship status: Married  Other Topics Concern  . Not on file  Social History Narrative  . Not on file    Family History  Problem Relation Age of Onset  . Lung cancer Father        Asbestos with mesothelioma  . Heart disease Paternal Grandmother        ? etiology  . Diabetes Paternal Grandmother   . Diabetes Brother   . Uterine cancer Paternal Aunt   . Stroke Paternal Grandfather        > 68  . Heart attack Brother 9  . Allergies Sister   . Aneurysm Mother        congenital  . Stroke Brother        two strokes  . Barrett's esophagus Brother     Review of Systems     Objective:   Vitals:   10/21/18 1413  BP: 140/78  Pulse: 77  Resp: 16  Temp: 98.4 F (36.9 C)  SpO2: 97%   BP Readings from Last 3 Encounters:  10/21/18 140/78  10/08/18 118/68  08/26/18 104/80   Wt Readings from Last 3 Encounters:  10/21/18 150 lb 6.4 oz (68.2 kg)  10/08/18 151 lb 3.2 oz (68.6 kg)  08/26/18 150 lb (68 kg)   Body mass index is 24.28 kg/m.   Physical Exam         Assessment & Plan:   15 minutes were spent face-to-face with the patient, over 50% of which was spent reviewing Ct scan, counseling regarding possible causes of abnormal findings on Ct scan an coordinating care.   See Problem List for Assessment and Plan of chronic medical problems.

## 2018-10-21 ENCOUNTER — Encounter: Payer: Self-pay | Admitting: Internal Medicine

## 2018-10-21 ENCOUNTER — Ambulatory Visit (INDEPENDENT_AMBULATORY_CARE_PROVIDER_SITE_OTHER): Payer: Medicare Other | Admitting: Internal Medicine

## 2018-10-21 ENCOUNTER — Other Ambulatory Visit: Payer: Self-pay

## 2018-10-21 VITALS — BP 140/78 | HR 77 | Temp 98.4°F | Resp 16 | Ht 66.0 in | Wt 150.4 lb

## 2018-10-21 DIAGNOSIS — Q782 Osteopetrosis: Secondary | ICD-10-CM | POA: Diagnosis not present

## 2018-10-21 NOTE — Assessment & Plan Note (Signed)
Seen on Ct scan from January 2020 Blood work normal in June Discussed some possible causes Needs further evaluation and I think it would be best if hem/onc did the evaluation - referral ordered for hem/onc

## 2018-10-21 NOTE — Patient Instructions (Addendum)
A referral was ordered for hematology/oncology ( blood specialist).   They will call you to schedule an appointment.

## 2018-11-05 ENCOUNTER — Ambulatory Visit (INDEPENDENT_AMBULATORY_CARE_PROVIDER_SITE_OTHER)
Admission: RE | Admit: 2018-11-05 | Discharge: 2018-11-05 | Disposition: A | Discharge status: Home / Self Care | Payer: Medicare Other | Source: Ambulatory Visit | Attending: Internal Medicine | Admitting: Internal Medicine

## 2018-11-05 ENCOUNTER — Other Ambulatory Visit: Payer: Self-pay

## 2018-11-05 DIAGNOSIS — J849 Interstitial pulmonary disease, unspecified: Secondary | ICD-10-CM | POA: Diagnosis not present

## 2018-11-05 DIAGNOSIS — R0602 Shortness of breath: Secondary | ICD-10-CM | POA: Diagnosis not present

## 2018-11-06 ENCOUNTER — Other Ambulatory Visit: Payer: Self-pay | Admitting: Internal Medicine

## 2018-11-06 ENCOUNTER — Telehealth: Payer: Self-pay | Admitting: Hematology and Oncology

## 2018-11-06 NOTE — Telephone Encounter (Signed)
Received a new patient referral from Dr. Quay Burow for Patchy sclerosis in osseous structures seen on Ct scan 02/10/18. Pt has been cld and scheduled to see Dr. Lorenso Courier on 10/21 at 10am. She's been made aware to arrive 15 minutes early.

## 2018-11-07 ENCOUNTER — Ambulatory Visit: Payer: Medicare Other | Admitting: Internal Medicine

## 2018-11-07 ENCOUNTER — Other Ambulatory Visit: Payer: Self-pay

## 2018-11-07 ENCOUNTER — Encounter: Payer: Self-pay | Admitting: Internal Medicine

## 2018-11-07 VITALS — BP 118/60 | HR 60 | Temp 98.0°F | Ht 66.0 in | Wt 151.0 lb

## 2018-11-07 DIAGNOSIS — Q782 Osteopetrosis: Secondary | ICD-10-CM | POA: Diagnosis not present

## 2018-11-07 DIAGNOSIS — R0902 Hypoxemia: Secondary | ICD-10-CM

## 2018-11-07 DIAGNOSIS — J849 Interstitial pulmonary disease, unspecified: Secondary | ICD-10-CM

## 2018-11-07 NOTE — Patient Instructions (Addendum)
Interstitial lung disease ([facility]) Exercise hypoxemia  - The reason for your pulmonary fibrosis is unknown  - though stable compared to earlier in the year I have concern it can unpredictably get worse especially because there is progression compared to 2014.  - refer you to thoracic surgery - Dr Roxan Hockey for consideration of surgical lung biopsy - ok to hold off portable oxygen for now because drop is transient and under heavy eertion only  Bony sclerosis  -You are seeing Dr. Narda Rutherford on November 12, 2018.  He is a blood doctor otherwise call hematologist seeing you for this bony problem which is usually handled by them. -I do not see you having any blood test before you see him -but no medical assistant can confirm. -I think the bony problem and the lung problem might be 2 separate issues   Follow-up -6-8 weeks;  30-minute ILD clinic -hopefully will have a lung biopsy by then and we have made some progress understanding of bone problems.

## 2018-11-07 NOTE — Progress Notes (Signed)
IOV 12/04/2017: Dr Lamonte Sakai: [name] is a 78 year old never smoker with a history of allergic rhinitis, GERD, hypothyroidism, MVP.  She is been seen in our office in the past by Dr. Gwenette Greet for restless leg syndrome-she still treated for this with Requip. On allegra, nexium prn.   She is referred today for dyspnea.  She reports that she has had an exercise routine where she walked w her husband 2 miles, exercises at the [facility], for several years. She has noticed more SOB, especially with mild hills. Sometimes now has to stop briefly to complete the walk. Occasional chest tightness, no wheeze. No overt CP. She reports daily cough, often in the am, sometimes clear mucous. She is on allegra. She had a walking stress test 09/2014 > reassuring ECG w exercise. Weight has been stable. Her last TSH was 06/25/17, 0.73.   Review of her notes shows hx possible GGI on prior CT 2014.   ROV 02/26/18 [name] --78 year old never smoker with a history of mitral valve prolapse whom I saw earlier this year for exertional dyspnea and chronic cough.  Pulmonary function testing consistent with restriction.  She has GERD and allergic rhinitis both of which could be contributing to her chronic cough.  I performed a high-resolution CT scan of the chest on 1/20 and reviewed today, this shows some patchy confluent subpleural reticular disease and groundglass attenuation with some minimal traction bronchiectasis and no frank honeycomb change.  This reflects a progression compared with 2014 and is an NSIP pattern.  Walking oximetry at her last visit did not show any exertional desaturation. She is still coughing, is using fexofenadine, takes nexium qod.   [place], had pesticide exposure.   ROV 08/26/2018 [name] --follow-up visit for 78 year old woman with a history of mitral valve prolapse, chronic cough, dyspnea with restrictive lung disease noted on pulmonary function testing.  High Res CT scan of the chest shows  some mild interstitial disease.  She was also having cough - better with flonase; still on nexium, allegra. Her exertional tolerance is improved, she has been exercising more. She remains on Requip for RLS.   We performed autoimmune labs last time, ANA positive at low titer (1: 80), SSA and SSB negative, RNP negative, RF negative, CCP negative, Anti-Smith negative, anti-SCL negative, DS DNA negative, anti-Jo negative, aldolase negative. She hasn't been seen in ILD clinic yet.    OV 10/08/2018  Subjective:  Patient ID: Elizabeth Espinoza, female , DOB: 1940-05-18 , age 36 y.o. , MRN: [id number] , ADDRESS: [place] [address]   10/08/2018 -   Chief Complaint  Patient presents with   Interstitial Lung Disease    Breathing the same as it was during August 2020 office visit with Dr. Lamonte Sakai     HPI Elizabeth Espinoza 78 y.o. -has been referred to the interstitial lung disease clinic because of findings of interstitial lung disease.  History is gathered from talking to her, review of Dr. Collene Gobble notes and also the integrated ILD questionnaire.  Briefly, she tells me that she was working out at [employer] and would just notice occasional dyspnea but she really did not compare it with other people.  Then in October 2019 she started walking 3 miles daily except on the days it rains with a husband.  During this time she noticed that she was falling really behind because of shortness of breath and exertional fatigue.  This resulted in  subsequent evaluation all documented above.  Findings of interstitial lung disease with subpleural reticulation suggestive of an alternative diagnosis.  Autoimmune profile essentially negative other than trace positive ANA.  She tells me that her significant major problem is exertional dyspnea when walking stairs or walking several miles.  She did not desaturate in our office several months ago.  She does not know if she desaturates when she exerts walking 3  miles.  Pulmonary function test earlier this year is just isolated low DLCO.  She also has like a cough.  Overall since the onset of the symptoms by exercising herself more and conditioning she is somewhat better.  She did see Dr. Kirk Ruths in May 2020 and in June 2020 had a coronary calcium CT which appears to have no calcium deposits.   [place] Integrated Comprehensive ILD Questionnaire  Symptoms:  -Dyspnea started suddenly and since it started it is better.  She says it is been present for years although she did tell me that it is only there since October 2019 when she noticed that.  Severity is listed below.  She does have a cough almost 1 year.  Since it started it is better.  It is mostly in the morning.  She does bring up some phlegm.  Early on in the morning it is green or yellow.  Since it started it is the same/better.  There is no wheezing.  She does have some chest tightness with this when she walks.  It is relieved by rest.  Cardiac work-up in June 2020 showed no calcium deposits.   Past Medical History : Positive for chronic longstanding acid reflux disease and thyroid disease..  In addition CT scan from January 2020 shows hiatal hernia that is small..  This presence of  sclerosis in the bony structures in January 2020.-Primary care physician has been sent a message today.  There is no asthma or COPD or heart failure rheumatoid arthritis or collagen vascular disease.  She does have GERD and hiatal hernia for several years to decades.  No sleep apnea.  No blood clots.  No hepatitis.  No tuberculosis.  No pleurisy.   ROS:  -She does have fatigue for the last several years.  She does have some back and hip issues.  She does have dry eyes.  She does have like some dysphagia.  There is presence of hiatal hernia-there is a small.  Acid reflux for several decades.  She also reports presence of nonspecific rash   FAMILY HISTORY of LUNG DISEASE: * -Her father died of mesothelioma in nineteen  83/1984 at the age of 30 otherwise no lung disease.   EXPOSURE HISTORY:   -When she was 17 she smokes cigarettes but otherwise no cigarette or tobacco use or electronic cigarette.  Never smoked marijuana.  No cocaine use no intravenous drug use.   HOME and HOBBY DETAILS :  -[place] home [place] setting for the last 16 years in a 78 year old home.  No mold or mildew exposure in the [facility] duct or CPAP mask or humidifier.  No mold or mildew in the bathroom.  No pet birds in the house.  No misting [place].  No feather pillows no feather duvet.  No musical instruments.  She does some occasional gardening which she likes.  She does do some fine-needle work.   OCCUPATIONAL HISTORY (122 questions) :  = Essentially negative except for the fact when she was a child she did some tobacco growing.  She has done home gardening  for 50 years.    PULMONARY TOXICITY HISTORY (27 items):  denies  Results for VERTIS, BAUDER (MRN [id number]) as of 11/07/2018 11:20  Ref. Range 02/26/2018 12:17  Anti Nuclear Antibody (ANA) Latest Ref Range: NEGATIVE  POSITIVE (A)  ANA Pattern 1 Unknown Nuclear, Speckled (A)  ANA Titer 1 Latest Units: titer 1:80 (H)  Anti JO-1 Latest Ref Range: 0.0 - 0.9 AI <3.8  Cyclic Citrullin Peptide Ab Latest Units: UNITS <16  ds DNA Ab Latest Units: IU/mL <1  RA Latex Turbid. Latest Ref Range: <14 IU/mL <14  ENA SM Ab Ser-aCnc Latest Ref Range: <1.0 NEG AI <1.0 NEG  Ribonucleic Protein(ENA) Antibody, IgG Latest Ref Range: <1.0 NEG AI <1.0 NEG  SSA (Ro) (ENA) Antibody, IgG Latest Ref Range: <1.0 NEG AI <1.0 NEG  SSB (La) (ENA) Antibody, IgG Latest Ref Range: <1.0 NEG AI <1.0 NEG  Scleroderma (Scl-70) (ENA) Antibody, IgG Latest Ref Range: <1.0 NEG AI <1.0 NEG   Results for MOSETTA, FERDINAND (MRN [id number]) as of 11/07/2018 11:20  Ref. Range 01/29/2018 14:31  FVC-Pre Latest Units: L 2.97  FVC-%Pred-Pre Latest Units: % 98  Results for YARIELIS, FUNARO (MRN [id number]) as of 11/07/2018  11:20  Ref. Range 01/29/2018 14:31  DLCO unc Latest Units: ml/min/mmHg 14.80  DLCO unc % pred Latest Units: % 54    OV 11/07/2018  Subjective:  Patient ID: Elizabeth Espinoza, female , DOB: [birth year] , age 39 y.o. , MRN: [id number] , ADDRESS: [place] [id number]   11/07/2018 -   Chief Complaint  Patient presents with   Follow-up    Patient reports that she has sob with any exertion.    Follow-up interstitial lung disease  HPI Elizabeth OCONNOR 78 y.o. -last seen September 2020.  After that she was supposed to have follow-up high-resolution CT chest and spirometry DLCO.  For some reason the spirometry DLCO is not done.  In the interim her symptoms remain the same as shown by the symptom score below.  She does note when she does heavy exertion such as climbing stairs or long uphill walks her pulse ox drops to 84% but quickly regains.  She is not interested in portable oxygen.  She had a repeat high-resolution CT chest in September 2020 and when compared to January 2020 there is no significant change.  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  I personally visualized the film.  There is diffuse bilateral subpleural reticulation and some traction bronchiectasis.  I myself would say it may be this is indeterminate for UIP.  But there is some minimal air trapping and she is done some gardening work.  There is no upper zonal predominance that would fit in with hypersensitive pneumonitis.  She has incidental findings of bony sclerosis that is again repeated in the CT scan.  Her primary care physician Billey Gosling evaluated her.  I reviewed the note.  She is been referred to Dr. Narda Rutherford in hematology who she is seeing November 12, 2018.  She is very confused about the fact that she has a bony sclerosis problem for which she is being referred to a blood doctor [hematologist] and she thought she is having a  blood test that was ordered by me.  I reviewed the chart and clarified this concepts     SYMPTOM SCALE - ILD     O2 use 10/08/2018  11/07/2018   Shortness of Breath  0 -> 5 scale with 5 being worst (score 6 If unable to do)   At rest 0 0  Simple tasks - showers, clothes change, eating, shaving 1 1  Household (dishes, doing bed, laundry) 2 2  Shopping 2 2  Walking level at own pace 2 1  Walking keeping up with others of same age 54 2  Walking up Stairs 4 5  Walking up Hill 5 5  Total (40 - 48) Dyspnea Score 20 18  How bad is your cough? 1 3  How bad is your fatigue 2 2.5     Simple office walk 185 feet x  3 laps goal with forehead probe 10/08/2018  11/07/2018   O2 used ra ra  Number laps completed 3 3  Comments about pace good fast  Resting Pulse Ox/HR 98% and 77/min 100% and 62/min  Final Pulse Ox/HR 95% and 96/min 96% and 105/min  Desaturated </= 88% no no  Desaturated <= 3% points yes Yes, 4 ppoints  Got Tachycardic >/= 90/min yes yes  Symptoms at end of test none none  Miscellaneous comments x       Ct Chest High Resolution  Result Date: 11/05/2018 CLINICAL DATA:  78 year old female with history of interstitial lung disease. Increased shortness of breath and cough over the past year. EXAM: CT CHEST WITHOUT CONTRAST TECHNIQUE: Multidetector CT imaging of the chest was performed following the standard protocol without intravenous contrast. High resolution imaging of the lungs, as well as inspiratory and expiratory imaging, was performed. COMPARISON:  High-resolution chest CT 02/10/2018. FINDINGS: Cardiovascular: Heart size is normal. There is no significant pericardial fluid, thickening or pericardial calcification. There is aortic atherosclerosis, as well as atherosclerosis of the great vessels of the mediastinum and the coronary arteries, including calcified atherosclerotic plaque in the left anterior descending and right coronary arteries. Mild calcifications of the  mitral annulus. Mediastinum/Nodes: No pathologically enlarged mediastinal or hilar lymph nodes. Please note that accurate exclusion of hilar adenopathy is limited on noncontrast CT scans. Esophagus is unremarkable in appearance. No axillary lymphadenopathy. Lungs/Pleura: High-resolution images demonstrates some patchy areas of peripheral predominant septal thickening and subpleural reticulation, with mild cylindrical bronchiectasis and peripheral bronchiolectasis. No frank honeycombing confidently identified at this time. These findings have no definitive craniocaudal gradient. In the periphery of the mid to upper lungs there also some plaque-like areas of apparent pleuroparenchymal scarring and volume loss. Inspiratory and expiratory imaging demonstrates minimal air trapping indicative of very mild small airways disease. Overall, these imaging findings appear stable compared to the prior study. Upper Abdomen: Aortic atherosclerosis. Musculoskeletal: Mild diffuse sclerosis throughout the visualized axial and appendicular skeleton, similar to prior examinations. There are no definite focal aggressive appearing lytic or blastic lesions noted in the visualized portions of the skeleton. IMPRESSION: 1. 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 2. Aortic atherosclerosis, in addition to 2 vessel coronary artery disease. Assessment for potential risk factor modification, dietary therapy or pharmacologic therapy may be warranted, if clinically indicated. 3. Persistent mild diffuse sclerosis throughout the visualized osseous structures without discrete aggressive appearing osseous lesions. Clinical correlation for  signs and symptoms of potential infiltrative process such as myelofibrosis is suggested. Aortic Atherosclerosis (ICD10-I70.0). Electronically Signed   By: Quillian Quince  Entrikin M.D.   On: 11/05/2018 14:36        ROS - per HPI     has a past medical history of Allergic rhinitis, GERD (gastroesophageal reflux disease), Glaucoma, Hiatal hernia, Hyperlipidemia, Hypothyroidism, ILD (interstitial lung disease) ([place]), and MVP (mitral valve prolapse).   reports that she has never smoked. She has never used smokeless tobacco.  Past Surgical History:  Procedure Laterality Date   APPENDECTOMY     with [place]   negative   COLONOSCOPY  2005   Dr Olevia Perches   COLONOSCOPY  01/2013   Dr Collene Mares   esophageal dilation  07/04/2011   Dr Estrella Myrtle SIGMOIDOSCOPY  1999    Dr Olevia Perches   KNEE ARTHROSCOPY  2001, 2005   Dr Percell Miller   bilat   TOTAL ABDOMINAL HYSTERECTOMY     for Endometriosis; no BSO, Dr Colin Ina   UPPER GI ENDOSCOPY  01/2013   Dr Collene Mares    Allergies  Allergen Reactions   Caffeine     palpitations   Chlorpheniramine-Pseudoeph     palpitations   Maxifed     palpitations    Immunization History  Administered Date(s) Administered   Influenza Split 11/07/2010   Influenza Whole 10/30/2009, 11/27/2011   Influenza, High Dose Seasonal PF 10/27/2012, 11/05/2013, 10/01/2017, 09/11/2018   Influenza-Unspecified 11/24/2014, 10/07/2015, 10/08/2016   Pneumococcal Conjugate-13 10/07/2015   Pneumococcal Polysaccharide-23 07/02/2017   Tdap 02/11/2012   Zoster 01/09/2012   Zoster Recombinat (Shingrix) 10/01/2017, 12/03/2017    Family History  Problem Relation Age of Onset   Lung cancer Father        Asbestos with mesothelioma   Heart disease Paternal Grandmother        ? etiology   Diabetes Paternal Grandmother    Diabetes Brother    Uterine cancer Paternal Aunt    Stroke Paternal Grandfather        > 68   Heart attack Brother  66   Allergies Sister    Aneurysm Mother        congenital   Stroke Brother        two strokes   Barrett's esophagus Brother      Current Outpatient Medications:    aspirin EC 81 MG tablet, Take 1 tablet (81 mg total) by mouth daily., Disp: 90 tablet, Rfl: 3   Biotin 1000 MCG tablet, Take 1,000 mcg by mouth daily., Disp: , Rfl:    Cholecalciferol (VITAMIN D-3 PO), Take by mouth daily., Disp: , Rfl:    esomeprazole (NEXIUM) 20 MG capsule, Take 1 capsule (20 mg total) by mouth daily at 12 noon. (Patient taking differently: Take 20 mg by mouth daily as needed. ), Disp: , Rfl:    Fexofenadine HCl (ALLEGRA PO), Take 1 tablet by mouth daily. , Disp: , Rfl:    MAGNESIUM CARBONATE PO, Take 1 tablet by mouth daily. , Disp: , Rfl:    metoprolol tartrate (LOPRESSOR) 50 MG tablet, TAKE 2 HOURS PRIOR TO CT SCAN (Patient taking differently: TAKE 2 HOURS PRIOR TO CT SCAN), Disp: 1 tablet, Rfl: 0   pravastatin (PRAVACHOL) 40 MG tablet, TAKE 1 TABLET BY MOUTH  DAILY, Disp: 90 tablet, Rfl: 1   Probiotic Product (PROBIOTIC DAILY PO), Take 1 capsule by mouth daily as needed. ULTRA FLORA , Disp: , Rfl:    rOPINIRole (REQUIP) 0.5 MG tablet, TAKE 2 TABLETS BY MOUTH  AFTER DINNER EACH NIGHT., Disp: 180 tablet, Rfl: 2   SYNTHROID  75 MCG tablet, TAKE 1 TABLET BY MOUTH  DAILY BEFORE BREAKFAST (Patient taking differently: Take 75 mcg by mouth daily before breakfast. Uses Name Brand), Disp: 90 tablet, Rfl: 2      Objective:   Vitals:   11/07/18 1105  BP: 118/60  Pulse: 60  Temp: 98 F (36.7 C)  TempSrc: Temporal  SpO2: 99%  Weight: 151 lb (68.5 kg)  Height: 5' 6" (1.676 m)    Estimated body mass index is 24.37 kg/m as calculated from the following:   Height as of this encounter: 5' 6" (1.676 m).   Weight as of this encounter: 151 lb (68.5 kg).  _0 @  Filed Weights   11/07/18 1105  Weight: 151 lb (68.5 kg)     Physical Exam Discussion only visit        Assessment:        ICD-10-CM   1. Interstitial lung disease ([place])  J84.9   2. 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  Autoimmune is negative.  There is only some gardening and minimal air trapping.  This could still be hypersensitive pneumonitis of fibrotic NSIP.  I think lung biopsy is indicated.  I think a bronchoalveolar lavage with genomic transbronchial biopsy might not give the necessary good yield or sensitive enough.  Therefore I think surgical lung biopsy is preferred.  At last visit she remembers me discussing bronchoscopy.  We went over the algorithm again.  I think it is best to start with a surgical lung biopsy which is gold standard and has best sensitivity and specificity.  She is agreed to see thoracic surgery.  I think with a normal cardiac stress test and functional status and good body habitus and renal function she should be able to handle surgical lung biopsy with just low risk for complications.  But will have thoracic surgery decide.  In terms of her bony sclerosis this is probably independent and needs to be evaluated.  I agree with the referral to Dr. Lorenso Courier in hematology.    Plan:     Patient Instructions  Interstitial lung disease ([place]) Exercise hypoxemia  - The reason for your pulmonary fibrosis is unknown  - though stable compared to earlier in the year I have concern it can unpredictably get worse especially because there is progression compared to 2014.  - refer you to thoracic surgery - Dr Roxan Hockey for consideration of surgical lung biopsy - ok to hold off portable oxygen for now because drop is transient and under heavy eertion only  Bony sclerosis  -You are seeing Dr. Narda Rutherford on November 12, 2018.  He is a blood doctor otherwise call hematologist seeing you for this bony problem which is usually handled by them. -I do not see you having any blood test  before you see him -but no medical assistant can confirm. -I think the bony problem and the lung problem might be 2 separate issues   Follow-up -6-8 weeks;  30-minute ILD clinic -hopefully will have a lung biopsy by then and we have made some progress understanding of bone problems.   > 50% of this > 25 min visit spent in face to face counseling or coordination of care - by this undersigned MD - Dr Brand Males. This includes one or more of the following documented above: discussion of test results, diagnostic or treatment recommendations, prognosis, risks  and benefits of management options, instructions, education, compliance or risk-factor reduction    SIGNATURE    Dr. Brand Males, M.D., F.C.C.P,  Pulmonary and Critical Care Medicine Staff Physician, [place] Director - Interstitial Lung Disease  Program  Pulmonary [place] at [place], [place], [address]  Pager: [phone], If no answer or between  15:00h - 7:00h: call [phone] Telephone: [phone]  11:54 AM 11/07/2018

## 2018-11-07 NOTE — Addendum Note (Signed)
Addended by: Hildred Alamin I on: 11/07/2018 12:03 PM   Modules accepted: Orders

## 2018-11-11 NOTE — Progress Notes (Signed)
Elizabeth [phone]   Fax:([phone]) [place] NOTE  Patient Care Team: Binnie Rail, MD as PCP - General (Internal Medicine) Stanford Breed Denice Bors, MD as Consulting Physician (Cardiology) Collene Gobble, MD as Consulting Physician (Pulmonary Disease)  Hematological/Oncological History # Diffuse/Non-focal Sclerosis of the Bones 1) Incidental finding of diffuse sclerosis of the bones noted on CT scan for ILD in Jan 2020. Not previously noted on scan report in 2014.  2) sclerosis again noted in Oct 2020 3) Establish care with Dr. Lorenso Courier 11/12/18  CHIEF COMPLAINTS/PURPOSE OF CONSULTATION:  Sclerosis of the bone noted on CT Chest  HISTORY OF PRESENTING ILLNESS:  Elizabeth Espinoza 78 y.o. female with medical history significant for interstitial lung disease GERD and hypothyroidism who presents for evaluation of incidentally discovered sclerosis of the bones.  Elizabeth Espinoza follows with pulmonology who routinely order CT scans to evaluate her interstitial lung disease.  On her last 2 scans it was incidentally noted that she had sclerosis of the bones.  The radiology report noted that this condition can be seen with myelofibrosis, but the clinical correlation was required.  Patient has been referred here for further evaluation of this incidental finding.  On exam today Elizabeth Espinoza notes she has been having issues with shortness of breath over the past year.  The patient notes that she becomes short of breath when exerting herself particularly when walking around [employer].  She has a pulse oximeter and has seen values as low as 84%. The patient does endorse having sweats occasionally at night.  These are not drenching night sweats these are just the feeling of being steamy and then comes and goes.  The patient does endorse a cough with greenish sputum. She denies any nausea vomiting or diarrhea. She denies any recurrent infections.  The patient does  endorse occasional aches and pains particularly in the hip and the lower spine.  She has been noted to have protruding disc pain before in the past.  The patient does also note that her fingers occasionally turn blue.  She denies any issues with swelling of the lower extremities or of the digits.  Patient notes that she is currently taking medication for hypothyroidism and that has recently stabilized.  She does endorse fatigue and issues with insomnia, however she denies any constipation, poor appetite, or weight changes.  On review of the chart it can be noted she has had a CT scan in Jan 2020 and most recently Sept 2020, both demonstrating stable sclerosis of the bones. She had a CT scan performed in 2014, which upon reviewing retroactively also has diffuse sclerosis. She has had no hematological abnormalities on record other than 11/2012 at which time she had mild leukocytosis and anemia in the setting of painless rectal bleeding.   She denies any fevers, chills, nausea, vomiting, diarrhea, focal bone pain, weight loss, or recurrent infections. A 14 point ROS is listed below.      MEDICAL HISTORY:  Past Medical History:  Diagnosis Date  . Allergic rhinitis   . GERD (gastroesophageal reflux disease)   . Glaucoma    SUSPECT  . Hiatal hernia   . Hyperlipidemia    LDL goal = < 100  . Hypothyroidism   . ILD (interstitial lung disease) ([place])   . MVP (mitral valve prolapse)    documented on 2 D ECHO    SURGICAL HISTORY: Past Surgical History:  Procedure Laterality Date  . APPENDECTOMY  with TAH  . CARDIAC CATHETERIZATION  1989   negative  . COLONOSCOPY  2005   Dr Olevia Perches  . COLONOSCOPY  01/2013   Dr Collene Mares  . esophageal dilation  07/04/2011   Dr Olevia Perches  . FLEXIBLE SIGMOIDOSCOPY  1999    Dr Olevia Perches  . KNEE ARTHROSCOPY  2001, 2005   Dr Percell Miller   bilat  . TOTAL ABDOMINAL HYSTERECTOMY     for Endometriosis; no BSO, Dr Colin Ina  . UPPER GI ENDOSCOPY  01/2013   Dr Collene Mares    SOCIAL  HISTORY: Social History   Socioeconomic History  . Marital status: Married    Spouse name: Jeneen Rinks  . Number of children: 2  . Years of education: Not on file  . Highest education level: Not on file  Occupational History  . 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 Financial resource strain: Not hard at all  . Food insecurity    Worry: Never true    Inability: Never true  . Transportation needs    Medical: No    Non-medical: No  Tobacco Use  . Smoking status: Never Smoker  . Smokeless tobacco: Never Used  Substance and Sexual Activity  . Alcohol use: Yes    Comment:  rarely  . Drug use: No  . Sexual activity: Yes  Lifestyle  . Physical activity    Days per week: 5 days    Minutes per session: 50 min  . Stress: Not at all  Relationships  . Social connections    Talks on phone: More than three times a week    Gets together: More than three times a week    Attends religious service: More than 4 times per year    Active member of club or organization: Yes    Attends meetings of clubs or organizations: More than 4 times per year    Relationship status: Married  . Intimate partner violence    Fear of current or ex partner: No    Emotionally abused: No    Physically abused: No    Forced sexual activity: No  Other Topics Concern  . Not on file  Social History Narrative  . Not on file    FAMILY HISTORY: Family History  Problem Relation Age of Onset  . Lung cancer Father        Asbestos with mesothelioma  . Heart disease Paternal Grandmother        ? etiology  . Diabetes Paternal Grandmother   . Diabetes Brother   . Uterine cancer Paternal Aunt   . Stroke Paternal Grandfather        > 63  . Heart attack Brother 28  . Allergies Sister   . Aneurysm Mother        congenital  . Stroke Brother        two strokes  . Barrett's esophagus Brother     ALLERGIES:  is allergic to caffeine; chlorpheniramine-pseudoeph; and maxifed.  MEDICATIONS:   Current Outpatient Medications  Medication Sig Dispense Refill  . aspirin EC 81 MG tablet Take 1 tablet (81 mg total) by mouth daily. 90 tablet 3  . Biotin 1000 MCG tablet Take 1,000 mcg by mouth daily.    . Cholecalciferol (VITAMIN D-3 PO) Take by mouth daily.    Marland Kitchen esomeprazole (NEXIUM) 20 MG capsule Take 1 capsule (20 mg total) by mouth daily at 12 noon. (Patient taking differently: Take 20 mg by mouth daily as needed. )    .  Fexofenadine HCl (ALLEGRA PO) Take 1 tablet by mouth daily.     Marland Kitchen MAGNESIUM CARBONATE PO Take 1 tablet by mouth daily.     . metoprolol tartrate (LOPRESSOR) 50 MG tablet TAKE 2 HOURS PRIOR TO CT SCAN (Patient taking differently: TAKE 2 HOURS PRIOR TO CT SCAN) 1 tablet 0  . pravastatin (PRAVACHOL) 40 MG tablet TAKE 1 TABLET BY MOUTH  DAILY 90 tablet 1  . Probiotic Product (PROBIOTIC DAILY PO) Take 1 capsule by mouth daily as needed. ULTRA FLORA     . rOPINIRole (REQUIP) 0.5 MG tablet TAKE 2 TABLETS BY MOUTH  AFTER DINNER EACH NIGHT. 180 tablet 2  . SYNTHROID 75 MCG tablet TAKE 1 TABLET BY MOUTH  DAILY BEFORE BREAKFAST (Patient taking differently: Take 75 mcg by mouth daily before breakfast. Uses Name Brand) 90 tablet 2   No current facility-administered medications for this visit.     REVIEW OF SYSTEMS:   Constitutional: ( - ) fevers, ( - )  chills , ( +) night sweats Eyes: ( - ) blurriness of vision, ( - ) double vision, ( - ) watery eyes Ears, nose, mouth, throat, and face: ( - ) mucositis, ( - ) sore throat Respiratory: ( +) cough, ( +) dyspnea, ( - ) wheezes Cardiovascular: ( - ) palpitation, ( - ) chest discomfort, ( - ) lower extremity swelling Gastrointestinal:  ( - ) nausea, (+ ) heartburn, ( - ) change in bowel habits Skin: ( - ) abnormal skin rashes Lymphatics: ( - ) new lymphadenopathy, ( - ) easy bruising Neurological: ( - ) numbness, ( - ) tingling, ( - ) new weaknesses Behavioral/Psych: ( - ) mood change, ( - ) new changes  All other systems were  reviewed with the patient and are negative.  PHYSICAL EXAMINATION: ECOG PERFORMANCE STATUS: 1 - Symptomatic but completely ambulatory  Vitals:   11/12/18 1018  BP: 136/62  Pulse: 64  Resp: 18  Temp: 97.8 F (36.6 C)  SpO2: 99%   Filed Weights   11/12/18 1018  Weight: 150 lb 4.8 oz (68.2 kg)    GENERAL: well appearing elderly Caucasian female in no distress, appearscomfortable SKIN: skin color, texture, turgor are normal, no rashes or significant lesions EYES: conjunctiva are pink and non-injected, sclera clear NECK: supple, non-tender LYMPH:  no palpable lymphadenopathy in the cervical or supraclavicular lymph nodes LUNGS: clear to auscultation and percussion with normal breathing effort HEART: regular rate & rhythm and no murmurs and no lower extremity edema ABDOMEN: soft, non-tender, non-distended, normal bowel sounds Musculoskeletal: no cyanosis of digits and no clubbing  PSYCH: alert & oriented x 3, fluent speech NEURO: no focal motor/sensory deficits  LABORATORY DATA:  I have reviewed the data as listed Lab Results  Component Value Date   WBC 8.5 07/04/2018   HGB 13.9 07/04/2018   HCT 42.1 07/04/2018   MCV 89.3 07/04/2018   PLT 238.0 07/04/2018   NEUTROABS 4.9 07/04/2018   Recent Results (from the past 2160 hour(s))  TSH     Status: None   Collection Time: 09/30/18 11:15 AM  Result Value Ref Range   TSH 1.48 0.35 - 4.50 uIU/mL    BLOOD FILM:  I personally reviewed the patient's peripheral blood smear today.  There was no peripheral blasts.  The white blood cells and red blood cells were of normal morphology. There was no schistocytosis or anisocytosis.  The platelets are of normal size and I have verified that there were no platelet  clumping. Normal blood film with no notable abnormalities.   RADIOGRAPHIC STUDIES: I have personally reviewed the radiological images as listed and agreed with the findings in the report: no focal lesions concerning for metastatic  lesions to the bones. Diffuse sclerosis.  Ct Chest High Resolution  Result Date: 11/05/2018 CLINICAL DATA:  78 year old female with history of interstitial lung disease. Increased shortness of breath and cough over the past year. EXAM: CT CHEST WITHOUT CONTRAST TECHNIQUE: Multidetector CT imaging of the chest was performed following the standard protocol without intravenous contrast. High resolution imaging of the lungs, as well as inspiratory and expiratory imaging, was performed. COMPARISON:  High-resolution chest CT 02/10/2018. FINDINGS: Cardiovascular: Heart size is normal. There is no significant pericardial fluid, thickening or pericardial calcification. There is aortic atherosclerosis, as well as atherosclerosis of the great vessels of the mediastinum and the coronary arteries, including calcified atherosclerotic plaque in the left anterior descending and right coronary arteries. Mild calcifications of the mitral annulus. Mediastinum/Nodes: No pathologically enlarged mediastinal or hilar lymph nodes. Please note that accurate exclusion of hilar adenopathy is limited on noncontrast CT scans. Esophagus is unremarkable in appearance. No axillary lymphadenopathy. Lungs/Pleura: High-resolution images demonstrates some patchy areas of peripheral predominant septal thickening and subpleural reticulation, with mild cylindrical bronchiectasis and peripheral bronchiolectasis. No frank honeycombing confidently identified at this time. These findings have no definitive craniocaudal gradient. In the periphery of the mid to upper lungs there also some plaque-like areas of apparent pleuroparenchymal scarring and volume loss. Inspiratory and expiratory imaging demonstrates minimal air trapping indicative of very mild small airways disease. Overall, these imaging findings appear stable compared to the prior study. Upper Abdomen: Aortic atherosclerosis. Musculoskeletal: Mild diffuse sclerosis throughout the visualized  axial and appendicular skeleton, similar to prior examinations. There are no definite focal aggressive appearing lytic or blastic lesions noted in the visualized portions of the skeleton. IMPRESSION: 1. 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 2. Aortic atherosclerosis, in addition to 2 vessel coronary artery disease. Assessment for potential risk factor modification, dietary therapy or pharmacologic therapy may be warranted, if clinically indicated. 3. Persistent mild diffuse sclerosis throughout the visualized osseous structures without discrete aggressive appearing osseous lesions. Clinical correlation for signs and symptoms of potential infiltrative process such as myelofibrosis is suggested. Aortic Atherosclerosis (ICD10-I70.0). Electronically Signed   By: Vinnie Langton M.D.   On: 11/05/2018 14:36    ASSESSMENT & PLAN Elizabeth Espinoza is a 78 year old female with medical history significant for interstitial lung disease GERD and hypothyroidism who presents for evaluation of incidentally discovered sclerosis of the bones.  This was initially noted on CT scan in January 2020 and most recently confirmed the most recent scan in October 2020.  Of note the patient had a normal CT scan in 2014.  Given the lack of any hematological findings or concerning issues on the peripheral blood film at this time I do not believe there is any indication for a bone marrow biopsy.  Alternative etiologies should be considered including hypoparathyroidism and pseudohypoparathyroidism.  Interestingly these findings can also be explained by hypothyroidism.  At this time we will conduct a full hematological  evaluation to assure that the patient does not have any signs of lymphoma  or myelofibrosis.  On discussion with Dr. Weber Cooks (radiology) he notes minimal change in the scan from Jan to Oct 2020. He also notes that it is evident on the scan from 2014, however a different technique was used and the finding was not as clear and more difficult to compare. I do agree with Dr. Jonnie Kind report that there are no focal aggressive appearing lesions on the scan. After reviewing the scans and the patient's prior blood work there is no clear evidence of a hematological or oncological problem. We will order an intial lab evaluation for sclerosis, however we would encourage if no diagnosis is found that further investigation be assumed by the patients PCP or through a referral to endocrinology.   #Diffuse Sclerosis of the Bones --diffuse sclerosis noted incidentally on CT scans in Jan 2020/Oct 2020. The patient has hip/spine pain but no focal bone pain.  --further review of labs reveal no findings concerning for myelofibrosis. Bone marrow biopsy is not clearly indicated at this time as her CBC/CMP are within normal limits and there are no abnormal RBCs noted on peripheral blood film.  --at this time there is no clear evidence of hematological or oncological disorder causing the patients findings.  --will check PTH, calcitriol to assess for hypoparathyroidism/ pseudohypoparathyroidism. If this is unrevealing further evaluation can be performed by general medicine.  --after discussion with radiology I agree there are no additional imaging studies that would be of use to determine the cause of this condition.  --f/u as needed for new hematological developments/worsening counts, abnormalities in blood work ordered today, or new concerning symptoms  Orders Placed This Encounter  Procedures  . CBC with Differential (Cancer Center Only)    Standing Status:   Future    Number of Occurrences:   1    Standing Expiration  Date:   11/11/2019  . Save Smear ([facility])    Standing Status:   Future    Number of Occurrences:   1    Standing Expiration Date:   11/11/2019  . Technologist smear review    Standing Status:   Future    Number of Occurrences:   1    Standing Expiration Date:   11/11/2019  . CMP ([facility] only)    Standing Status:   Future    Number of Occurrences:   1    Standing Expiration Date:   11/11/2019  . Lactate dehydrogenase (LDH)    Standing Status:   Future    Number of Occurrences:   1    Standing Expiration Date:   11/11/2019  . Magnesium    Standing Status:   Future    Number of Occurrences:   1    Standing Expiration Date:   11/11/2019  . Phosphorus    Standing Status:   Future    Number of Occurrences:   1    Standing Expiration Date:   11/11/2019  . Erythropoietin    Standing Status:   Future    Number of Occurrences:   1    Standing Expiration Date:   11/11/2019  . SPEP with reflex to IFE    Standing Status:   Future    Number of Occurrences:   1    Standing Expiration Date:   11/11/2019  . Kappa/lambda light chains    Standing Status:   Future    Number of Occurrences:   1    Standing Expiration Date:   11/11/2019  . PTH, intact and calcium  Standing Status:   Future    Number of Occurrences:   1    Standing Expiration Date:   12/12/2018  . Calcitriol (1,25 di-OH Vit D)    Standing Status:   Future    Number of Occurrences:   1    Standing Expiration Date:   12/12/2018    All questions were answered. The patient knows to call the clinic with any problems, questions or concerns.  A total of more than 60 minutes were spent face-to-face with the patient during this encounter and over half of that time was spent on counseling and coordination of care as outlined above.   Ledell Peoples, MD Department of Hematology/Oncology [facility] [facility] Phone: [phone]  Pager: [phone] Email: [email or web address]     11/12/2018 11:14 AM

## 2018-11-12 ENCOUNTER — Other Ambulatory Visit: Payer: Self-pay

## 2018-11-12 ENCOUNTER — Inpatient Hospital Stay: Payer: Medicare Other | Attending: Hematology and Oncology | Admitting: Hematology and Oncology

## 2018-11-12 ENCOUNTER — Inpatient Hospital Stay: Payer: Medicare Other

## 2018-11-12 VITALS — BP 136/62 | HR 64 | Temp 97.8°F | Resp 18 | Ht 66.0 in | Wt 150.3 lb

## 2018-11-12 DIAGNOSIS — H409 Unspecified glaucoma: Secondary | ICD-10-CM | POA: Diagnosis not present

## 2018-11-12 DIAGNOSIS — I341 Nonrheumatic mitral (valve) prolapse: Secondary | ICD-10-CM | POA: Diagnosis not present

## 2018-11-12 DIAGNOSIS — M899 Disorder of bone, unspecified: Secondary | ICD-10-CM | POA: Diagnosis not present

## 2018-11-12 DIAGNOSIS — Z79899 Other long term (current) drug therapy: Secondary | ICD-10-CM | POA: Insufficient documentation

## 2018-11-12 DIAGNOSIS — E785 Hyperlipidemia, unspecified: Secondary | ICD-10-CM | POA: Insufficient documentation

## 2018-11-12 DIAGNOSIS — G4700 Insomnia, unspecified: Secondary | ICD-10-CM | POA: Insufficient documentation

## 2018-11-12 DIAGNOSIS — I2510 Atherosclerotic heart disease of native coronary artery without angina pectoris: Secondary | ICD-10-CM | POA: Insufficient documentation

## 2018-11-12 DIAGNOSIS — E039 Hypothyroidism, unspecified: Secondary | ICD-10-CM | POA: Diagnosis not present

## 2018-11-12 DIAGNOSIS — K219 Gastro-esophageal reflux disease without esophagitis: Secondary | ICD-10-CM | POA: Diagnosis not present

## 2018-11-12 DIAGNOSIS — Z7982 Long term (current) use of aspirin: Secondary | ICD-10-CM | POA: Insufficient documentation

## 2018-11-12 DIAGNOSIS — M25559 Pain in unspecified hip: Secondary | ICD-10-CM | POA: Insufficient documentation

## 2018-11-12 DIAGNOSIS — Q782 Osteopetrosis: Secondary | ICD-10-CM

## 2018-11-12 DIAGNOSIS — I700 Atherosclerosis of aorta: Secondary | ICD-10-CM | POA: Insufficient documentation

## 2018-11-12 DIAGNOSIS — J849 Interstitial pulmonary disease, unspecified: Secondary | ICD-10-CM | POA: Insufficient documentation

## 2018-11-12 LAB — CBC WITH DIFFERENTIAL (CANCER CENTER ONLY)
Abs Immature Granulocytes: 0.01 10*3/uL (ref 0.00–0.07)
Basophils Absolute: 0.1 10*3/uL (ref 0.0–0.1)
Basophils Relative: 1 %
Eosinophils Absolute: 0.4 10*3/uL (ref 0.0–0.5)
Eosinophils Relative: 4 %
HCT: 41.8 % (ref 36.0–46.0)
Hemoglobin: 13.5 g/dL (ref 12.0–15.0)
Immature Granulocytes: 0 %
Lymphocytes Relative: 35 %
Lymphs Abs: 3.2 10*3/uL (ref 0.7–4.0)
MCH: 29.5 pg (ref 26.0–34.0)
MCHC: 32.3 g/dL (ref 30.0–36.0)
MCV: 91.3 fL (ref 80.0–100.0)
Monocytes Absolute: 0.7 10*3/uL (ref 0.1–1.0)
Monocytes Relative: 8 %
Neutro Abs: 4.7 10*3/uL (ref 1.7–7.7)
Neutrophils Relative %: 52 %
Platelet Count: 241 10*3/uL (ref 150–400)
RBC: 4.58 MIL/uL (ref 3.87–5.11)
RDW: 13.4 % (ref 11.5–15.5)
WBC Count: 9.1 10*3/uL (ref 4.0–10.5)
nRBC: 0 % (ref 0.0–0.2)

## 2018-11-12 LAB — LACTATE DEHYDROGENASE: LDH: 187 U/L (ref 98–192)

## 2018-11-12 LAB — TECHNOLOGIST SMEAR REVIEW: Plt Morphology: ADEQUATE

## 2018-11-12 LAB — CMP (CANCER CENTER ONLY)
ALT: 14 U/L (ref 0–44)
AST: 20 U/L (ref 15–41)
Albumin: 4.5 g/dL (ref 3.5–5.0)
Alkaline Phosphatase: 80 U/L (ref 38–126)
Anion gap: 11 (ref 5–15)
BUN: 15 mg/dL (ref 8–23)
CO2: 24 mmol/L (ref 22–32)
Calcium: 9.6 mg/dL (ref 8.9–10.3)
Chloride: 106 mmol/L (ref 98–111)
Creatinine: 0.78 mg/dL (ref 0.44–1.00)
GFR, Est AFR Am: >60 mL/min (ref >60)
GFR, Estimated: >60 mL/min (ref >60)
Glucose, Bld: 92 mg/dL (ref 70–99)
Potassium: 4.2 mmol/L (ref 3.5–5.1)
Sodium: 141 mmol/L (ref 135–145)
Total Bilirubin: 0.8 mg/dL (ref 0.3–1.2)
Total Protein: 7.6 g/dL (ref 6.5–8.1)

## 2018-11-12 LAB — PHOSPHORUS: Phosphorus: 4 mg/dL (ref 2.5–4.6)

## 2018-11-12 LAB — SAVE SMEAR(SSMR), FOR PROVIDER SLIDE REVIEW

## 2018-11-12 LAB — MAGNESIUM: Magnesium: 2.4 mg/dL (ref 1.7–2.4)

## 2018-11-13 ENCOUNTER — Telehealth: Payer: Self-pay | Admitting: Hematology and Oncology

## 2018-11-13 LAB — PROTEIN ELECTROPHORESIS, SERUM, WITH REFLEX
A/G Ratio: 1.5 (ref 0.7–1.7)
Albumin ELP: 4.3 g/dL (ref 2.9–4.4)
Alpha-1-Globulin: 0.2 g/dL (ref 0.0–0.4)
Alpha-2-Globulin: 1 g/dL (ref 0.4–1.0)
Beta Globulin: 0.9 g/dL (ref 0.7–1.3)
Gamma Globulin: 0.8 g/dL (ref 0.4–1.8)
Globulin, Total: 2.9 g/dL (ref 2.2–3.9)
Total Protein ELP: 7.2 g/dL (ref 6.0–8.5)

## 2018-11-13 LAB — PTH, INTACT AND CALCIUM
Calcium, Total (PTH): 9.3 mg/dL (ref 8.7–10.3)
PTH: 21 pg/mL (ref 15–65)

## 2018-11-13 LAB — CALCITRIOL (1,25 DI-OH VIT D): Vit D, 1,25-Dihydroxy: 65.3 pg/mL (ref 19.9–79.3)

## 2018-11-13 LAB — ERYTHROPOIETIN: Erythropoietin: 10.2 m[IU]/mL (ref 2.6–18.5)

## 2018-11-13 LAB — KAPPA/LAMBDA LIGHT CHAINS
Kappa free light chain: 17.4 mg/L (ref 3.3–19.4)
Kappa, lambda light chain ratio: 1.98 — ABNORMAL HIGH (ref 0.26–1.65)
Lambda free light chains: 8.8 mg/L (ref 5.7–26.3)

## 2018-11-13 NOTE — Telephone Encounter (Signed)
No los for 10/21

## 2018-11-17 ENCOUNTER — Telehealth: Payer: Self-pay | Admitting: *Deleted

## 2018-11-17 NOTE — Telephone Encounter (Signed)
TCT patient regarding lab results from lat week.  Spoke with patient. Advised that her labs were normal and that she just needs to foolow up with her PCP  As needed. Advised that she does not need to come to the cancer center at this time.  Pt voiced understanding.

## 2018-11-18 ENCOUNTER — Ambulatory Visit (INDEPENDENT_AMBULATORY_CARE_PROVIDER_SITE_OTHER): Payer: Medicare Other | Admitting: Internal Medicine

## 2018-11-18 ENCOUNTER — Encounter: Payer: Self-pay | Admitting: Internal Medicine

## 2018-11-18 ENCOUNTER — Other Ambulatory Visit: Payer: Self-pay

## 2018-11-18 DIAGNOSIS — M25511 Pain in right shoulder: Secondary | ICD-10-CM | POA: Insufficient documentation

## 2018-11-18 DIAGNOSIS — M25512 Pain in left shoulder: Secondary | ICD-10-CM

## 2018-11-18 MED ORDER — TIZANIDINE HCL 2 MG PO TABS: 2.0000 mg | ORAL_TABLET | Freq: Every evening | ORAL | 0 refills | Status: DC | PRN

## 2018-11-18 NOTE — Progress Notes (Signed)
Subjective:    Patient ID: Elizabeth Espinoza, female    DOB: September 18, 1940, 78 y.o.   MRN: [id number]  HPI The patient is here for an acute visit.   Two weeks ago she raked about 5 minutes only.  Afterwards she felt fine.  The next day her bilateral upper arms and shoulders were hurting.  She has some discomfort in her upper back that extends up to her neck as well.  When she moves her shoulders posteriorly she has the most pain.  Pain radiates down the left arm and to a lesser degree the right arm.  She denies any numbness, tingling or weakness in the arms.  Pain is improved only slightly since it started 2 weeks ago.  The pain wakes her up at night.  She tried epsom salt baths, put blue emu on it.      Medications and allergies reviewed with patient and updated if appropriate.  Patient Active Problem List   Diagnosis Date Noted  . Bony sclerosis 10/21/2018  . Chronic cough 01/29/2018  . Rash and nonspecific skin eruption 07/30/2016  . Prediabetes 02/24/2015  . Dyspnea on exertion 09/29/2014  . Interstitial lung disease ([place]) 12/11/2012  . Barrett's esophagus 12/11/2012  . RLS (restless legs syndrome) 04/13/2011  . 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 ARTHRALGIA 09/01/2008  . HIATAL HERNIA 07/31/2007  . History of cardiovascular disorder 07/31/2007  . Allergic rhinitis 06/19/2007  . GASTROESOPHAGEAL REFLUX DISEASE, CHRONIC 06/19/2007  . Hypothyroidism 02/07/2007  . HYPERLIPIDEMIA 02/07/2007    Current Outpatient Medications on File Prior to Visit  Medication Sig Dispense Refill  . aspirin EC 81 MG tablet Take 1 tablet (81 mg total) by mouth daily. 90 tablet 3  . Biotin 1000 MCG tablet Take 1,000 mcg by mouth daily.    . Cholecalciferol (VITAMIN D-3 PO) Take by mouth daily.    Marland Kitchen esomeprazole (NEXIUM) 20 MG capsule Take 1 capsule (20 mg total) by mouth daily at 12 noon. (Patient taking differently: Take 20 mg by mouth daily as needed. )    .  Fexofenadine HCl (ALLEGRA PO) Take 1 tablet by mouth daily.     Marland Kitchen MAGNESIUM CARBONATE PO Take 1 tablet by mouth daily.     . metoprolol tartrate (LOPRESSOR) 50 MG tablet TAKE 2 HOURS PRIOR TO CT SCAN (Patient taking differently: TAKE 2 HOURS PRIOR TO CT SCAN) 1 tablet 0  . pravastatin (PRAVACHOL) 40 MG tablet TAKE 1 TABLET BY MOUTH  DAILY 90 tablet 1  . Probiotic Product (PROBIOTIC DAILY PO) Take 1 capsule by mouth daily as needed. ULTRA FLORA     . rOPINIRole (REQUIP) 0.5 MG tablet TAKE 2 TABLETS BY MOUTH  AFTER DINNER EACH NIGHT. 180 tablet 2  . SYNTHROID 75 MCG tablet TAKE 1 TABLET BY MOUTH  DAILY BEFORE BREAKFAST (Patient taking differently: Take 75 mcg by mouth daily before breakfast. Uses Name Brand) 90 tablet 2  . [DISCONTINUED] loratadine (CLARITIN) 10 MG tablet Take 10 mg by mouth daily as needed.       No current facility-administered medications on file prior to visit.     Past Medical History:  Diagnosis Date  . Allergic rhinitis   . GERD (gastroesophageal reflux disease)   . Glaucoma    SUSPECT  . Hiatal hernia   . Hyperlipidemia    LDL goal = < 100  . Hypothyroidism   . ILD (interstitial lung disease) ([place])   . MVP (mitral valve prolapse)  documented on 2 D ECHO    Past Surgical History:  Procedure Laterality Date  . APPENDECTOMY     with TAH  . CARDIAC CATHETERIZATION  1989   negative  . COLONOSCOPY  2005   Dr Olevia Perches  . COLONOSCOPY  01/2013   Dr Collene Mares  . esophageal dilation  07/04/2011   Dr Olevia Perches  . FLEXIBLE SIGMOIDOSCOPY  1999    Dr Olevia Perches  . KNEE ARTHROSCOPY  2001, 2005   Dr Percell Miller   bilat  . TOTAL ABDOMINAL HYSTERECTOMY     for Endometriosis; no BSO, Dr Colin Ina  . UPPER GI ENDOSCOPY  01/2013   Dr Collene Mares    Social History   Socioeconomic History  . Marital status: Married    Spouse name: Jeneen Rinks  . Number of children: 2  . Years of education: Not on file  . Highest education level: Not on file  Occupational History  . 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 Financial resource strain: Not hard at all  . Food insecurity    Worry: Never true    Inability: Never true  . Transportation needs    Medical: No    Non-medical: No  Tobacco Use  . Smoking status: Never Smoker  . Smokeless tobacco: Never Used  Substance and Sexual Activity  . Alcohol use: Yes    Comment:  rarely  . Drug use: No  . Sexual activity: Yes  Lifestyle  . Physical activity    Days per week: 5 days    Minutes per session: 50 min  . Stress: Not at all  Relationships  . Social connections    Talks on phone: More than three times a week    Gets together: More than three times a week    Attends religious service: More than 4 times per year    Active member of club or organization: Yes    Attends meetings of clubs or organizations: More than 4 times per year    Relationship status: Married  Other Topics Concern  . Not on file  Social History Narrative  . Not on file    Family History  Problem Relation Age of Onset  . Lung cancer Father        Asbestos with mesothelioma  . Heart disease Paternal Grandmother        ? etiology  . Diabetes Paternal Grandmother   . Diabetes Brother   . Uterine cancer Paternal Aunt   . Stroke Paternal Grandfather        > 61  . Heart attack Brother 65  . Allergies Sister   . Aneurysm Mother        congenital  . Stroke Brother        two strokes  . Barrett's esophagus Brother     Review of Systems  Constitutional: Negative for fever.  Musculoskeletal: Positive for arthralgias and myalgias (Muscle tightness in upper back).  Neurological: Negative for dizziness, light-headedness and headaches.       Objective:   Vitals:   11/18/18 1400  BP: 136/80  Pulse: 61  Resp: 16  Temp: 97.8 F (36.6 C)  SpO2: 99%   BP Readings from Last 3 Encounters:  11/18/18 136/80  11/12/18 136/62  11/07/18 118/60   Wt Readings from Last 3 Encounters:  11/18/18 150 lb 12.8 oz (68.4 kg)  11/12/18  150 lb 4.8 oz (68.2 kg)  11/07/18 151 lb (68.5 kg)   Body mass index is 24.34  kg/m.   Physical Exam Constitutional:      General: She is not in acute distress.    Appearance: Normal appearance. She is not ill-appearing.  HENT:     Head: Normocephalic and atraumatic.  Musculoskeletal:     Comments: Mild bilateral upper back and posterior neck muscular discomfort.  Full range of motion of neck.  Bilateral shoulder discomfort with palpation in anterior, lateral and posterior aspects.  Pain along biceps tendon and pain in upper arms with palpation.  Increased pain with posterior movement of shoulders-left more than right.  Good range of motion, but with pain  Neurological:     Mental Status: She is alert.     Sensory: No sensory deficit.     Motor: No weakness.            Assessment & Plan:    See Problem List for Assessment and Plan of chronic medical problems.

## 2018-11-18 NOTE — Patient Instructions (Addendum)
Apply Voltaren gel to both shoulders - this is over the counter.     You can ice or heat the shoulders   Try the muscle relaxer at night.    Please call if there is no improvement in your symptoms.

## 2018-11-18 NOTE — Assessment & Plan Note (Addendum)
B/l shoulder pain after raking briefly 2 weeks ago Possible  Rotator cuff tendinitis, bursitis She cannot tolerate oral NSAIDs Start over-the-counter Voltaren gel bilateral shoulders Continue Tylenol We will try tizanidine for her upper back muscle tightness She is hesitant to try something like tramadol-I do see her husband and he takes it and can try one of his, but this may also help.  She understands she will only be taking this briefly and I can provide a prescription for her if she tolerates this Ice, heat If no improvement can refer to sports medicine/orthopedics

## 2018-11-25 ENCOUNTER — Other Ambulatory Visit: Payer: Self-pay

## 2018-11-25 ENCOUNTER — Institutional Professional Consult (permissible substitution): Payer: Medicare Other | Admitting: Thoracic Surgery (Cardiothoracic Vascular Surgery)

## 2018-11-25 ENCOUNTER — Encounter: Payer: Self-pay | Admitting: Thoracic Surgery (Cardiothoracic Vascular Surgery)

## 2018-11-25 VITALS — BP 144/77 | HR 81 | Temp 97.7°F | Resp 20 | Ht 66.0 in | Wt 151.6 lb

## 2018-11-25 DIAGNOSIS — J849 Interstitial pulmonary disease, unspecified: Secondary | ICD-10-CM | POA: Diagnosis not present

## 2018-11-25 NOTE — Progress Notes (Signed)
PCP is Burns, Claudina Lick, MD Referring Provider is Brand Males, MD  Chief Complaint  Patient presents with  . Interstitial Lung Disease    Surgical eval, Chest CT 11/05/18    HPI: [name] is sent for consultation regarding possible lung biopsy.  Elizabeth Espinoza is a 78 year old woman with a past history significant for hyperlipidemia, hypothyroidism, mitral valve prolapse, reflux, and interstitial lung disease.  She said she was first found to have an issue with her lungs on the CT scan back around 2016.  She was seen by pulmonary and has been followed since then.  She started exercising with her husband in July 2019.  By October she noted that she was having shortness of breath when she walked up inclines occasionally with associated chest tightness.  She has noted that is progressively gotten worse although it has leveled off over the past month or so and may have even slightly improved.  She was evaluated by Dr. Stanford Breed.  A stress test in January 2020 showed no ST changes.  She had a coronary CT in June 2020 which showed some calcification, but no significant stenosis.  Pulmonary function testing showed a diffusion capacity of 54% with normal FVC and FEV1.  Past Medical History:  Diagnosis Date  . Allergic rhinitis   . GERD (gastroesophageal reflux disease)   . Glaucoma    SUSPECT  . Hiatal hernia   . Hyperlipidemia    LDL goal = < 100  . Hypothyroidism   . ILD (interstitial lung disease) ([place])   . MVP (mitral valve prolapse)    documented on 2 D ECHO    Past Surgical History:  Procedure Laterality Date  . APPENDECTOMY     with TAH  . CARDIAC CATHETERIZATION  1989   negative  . COLONOSCOPY  2005   Dr Olevia Perches  . COLONOSCOPY  01/2013   Dr Collene Mares  . esophageal dilation  07/04/2011   Dr Olevia Perches  . FLEXIBLE SIGMOIDOSCOPY  1999    Dr Olevia Perches  . KNEE ARTHROSCOPY  2001, 2005   Dr Percell Miller   bilat  . TOTAL ABDOMINAL HYSTERECTOMY     for Endometriosis; no BSO, Dr Colin Ina   . UPPER GI ENDOSCOPY  01/2013   Dr Collene Mares    Family History  Problem Relation Age of Onset  . Lung cancer Father        Asbestos with mesothelioma  . Heart disease Paternal Grandmother        ? etiology  . Diabetes Paternal Grandmother   . Diabetes Brother   . Uterine cancer Paternal Aunt   . Stroke Paternal Grandfather        > 72  . Heart attack Brother 57  . Allergies Sister   . Aneurysm Mother        congenital  . Stroke Brother        two strokes  . Barrett's esophagus Brother     Social History Social History   Tobacco Use  . Smoking status: Never Smoker  . Smokeless tobacco: Never Used  Substance Use Topics  . Alcohol use: Yes    Comment:  rarely  . Drug use: No    Current Outpatient Medications  Medication Sig Dispense Refill  . aspirin EC 81 MG tablet Take 1 tablet (81 mg total) by mouth daily. 90 tablet 3  . Biotin 1000 MCG tablet Take 1,000 mcg by mouth daily.    . Cholecalciferol (VITAMIN D-3 PO) Take by mouth daily.    Marland Kitchen  esomeprazole (NEXIUM) 20 MG capsule Take 1 capsule (20 mg total) by mouth daily at 12 noon. (Patient taking differently: Take 20 mg by mouth daily as needed. )    . Fexofenadine HCl (ALLEGRA PO) Take 1 tablet by mouth daily.     Marland Kitchen MAGNESIUM CARBONATE PO Take 1 tablet by mouth daily.     . metoprolol tartrate (LOPRESSOR) 50 MG tablet TAKE 2 HOURS PRIOR TO CT SCAN (Patient taking differently: TAKE 2 HOURS PRIOR TO CT SCAN) 1 tablet 0  . pravastatin (PRAVACHOL) 40 MG tablet TAKE 1 TABLET BY MOUTH  DAILY 90 tablet 1  . Probiotic Product (PROBIOTIC DAILY PO) Take 1 capsule by mouth daily as needed. ULTRA FLORA     . rOPINIRole (REQUIP) 0.5 MG tablet TAKE 2 TABLETS BY MOUTH  AFTER DINNER EACH NIGHT. 180 tablet 2  . SYNTHROID 75 MCG tablet TAKE 1 TABLET BY MOUTH  DAILY BEFORE BREAKFAST (Patient taking differently: Take 75 mcg by mouth daily before breakfast. Uses Name Brand) 90 tablet 2  . tiZANidine (ZANAFLEX) 2 MG tablet Take 1 tablet (2 mg  total) by mouth at bedtime as needed for muscle spasms. 30 tablet 0   No current facility-administered medications for this visit.     Allergies  Allergen Reactions  . Caffeine     palpitations  . Chlorpheniramine-Pseudoeph     palpitations  . Maxifed     palpitations    Review of Systems  Constitutional: Positive for activity change and fatigue. Negative for appetite change, fever and unexpected weight change.  HENT: Negative for trouble swallowing and voice change.   Respiratory: Positive for cough, chest tightness and shortness of breath (With exertion associated with chest tightness).   Gastrointestinal: Positive for abdominal pain (Reflux ).  Genitourinary: Negative for difficulty urinating and dysuria.  Musculoskeletal: Negative for arthralgias and myalgias.       Leg cramps  Neurological: Negative for seizures, syncope and weakness.  Hematological: Negative for adenopathy. Bruises/bleeds easily.  All other systems reviewed and are negative.   BP (!) 144/77 (BP Location: Left Arm)   Pulse 81   Temp 97.7 F (36.5 C) (Skin)   Resp 20   Ht _0  (1.676 m)   Wt 151 lb 9.6 oz (68.8 kg)   SpO2 98% Comment: RA  BMI 24.47 kg/m  Physical Exam Vitals signs reviewed.  Constitutional:      General: She is not in acute distress.    Appearance: Normal appearance.  HENT:     Head: Normocephalic and atraumatic.  Eyes:     General: No scleral icterus.    Extraocular Movements: Extraocular movements intact.  Neck:     Musculoskeletal: Neck supple.  Cardiovascular:     Rate and Rhythm: Normal rate and regular rhythm.     Pulses: Normal pulses.     Heart sounds: Normal heart sounds. No murmur. No friction rub. No gallop.   Pulmonary:     Effort: Pulmonary effort is normal. No respiratory distress.     Breath sounds: Normal breath sounds. No wheezing or rales.  Abdominal:     General: There is no distension.     Palpations: Abdomen is soft.  Musculoskeletal:         General: No swelling.  Lymphadenopathy:     Cervical: No cervical adenopathy.  Skin:    General: Skin is warm and dry.  Neurological:     General: No focal deficit present.     Mental Status: She is  alert and oriented to person, place, and time.     Motor: No weakness.    Diagnostic Tests: CT CHEST WITHOUT CONTRAST  TECHNIQUE: Multidetector CT imaging of the chest was performed following the standard protocol without intravenous contrast. High resolution imaging of the lungs, as well as inspiratory and expiratory imaging, was performed.  COMPARISON:  High-resolution chest CT 02/10/2018.  FINDINGS: Cardiovascular: Heart size is normal. There is no significant pericardial fluid, thickening or pericardial calcification. There is aortic atherosclerosis, as well as atherosclerosis of the great vessels of the mediastinum and the coronary arteries, including calcified atherosclerotic plaque in the left anterior descending and right coronary arteries. Mild calcifications of the mitral annulus.  Mediastinum/Nodes: No pathologically enlarged mediastinal or hilar lymph nodes. Please note that accurate exclusion of hilar adenopathy is limited on noncontrast CT scans. Esophagus is unremarkable in appearance. No axillary lymphadenopathy.  Lungs/Pleura: High-resolution images demonstrates some patchy areas of peripheral predominant septal thickening and subpleural reticulation, with mild cylindrical bronchiectasis and peripheral bronchiolectasis. No frank honeycombing confidently identified at this time. These findings have no definitive craniocaudal gradient. In the periphery of the mid to upper lungs there also some plaque-like areas of apparent pleuroparenchymal scarring and volume loss. Inspiratory and expiratory imaging demonstrates minimal air trapping indicative of very mild small airways disease. Overall, these imaging findings appear stable compared to the prior  study.  Upper Abdomen: Aortic atherosclerosis.  Musculoskeletal: Mild diffuse sclerosis throughout the visualized axial and appendicular skeleton, similar to prior examinations. There are no definite focal aggressive appearing lytic or blastic lesions noted in the visualized portions of the skeleton.  IMPRESSION: 1. 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 2. Aortic atherosclerosis, in addition to 2 vessel coronary artery disease. Assessment for potential risk factor modification, dietary therapy or pharmacologic therapy may be warranted, if clinically indicated. 3. Persistent mild diffuse sclerosis throughout the visualized osseous structures without discrete aggressive appearing osseous lesions. Clinical correlation for signs and symptoms of potential infiltrative process such as myelofibrosis is suggested.  Aortic Atherosclerosis (ICD10-I70.0).   Electronically Signed   By: Vinnie Langton M.D.   On: 11/05/2018 14:36 I personally reviewed the CT images and concur with the findings noted above  Impression: Elizabeth Espinoza is a 78 year old woman with a past medical history significant for hyperlipidemia, hypothyroidism, mitral valve prolapse, reflux, and interstitial lung disease.  She has had known interstitial lung disease dating back to about 2016.  Over the past year she has had progressive dyspnea with exertion associated with chest tightness.  She has also noted her oxygen saturations are low when this occurs.  Cardiac work-up showed no significant coronary disease.  Pulmonary function testing showed a significant decrease in her diffusion capacity.  A CT  of the chest showed progression of subpleural reticulation and peripheral predominant septal thickening.  Findings were not consistent with UIP.  I discussed the possibility of a VATS for lung biopsy with Mr. and Mrs. Woolston.  I informed them of the general nature of the procedure, the incisions to be used, the need for general anesthesia, the use of drains to postoperatively, the expected hospital stay, and the overall recovery.  They understand this is diagnostic and not therapeutic.  There is no guarantee of  a definitive diagnosis.  I informed him of the indications, risks, benefits, and alternatives.  They understand the risks include, but not limited to death, MI, DVT, PE, bleeding, possible need for transfusion, infection, prolonged air leak, cardiac arrhythmias, as well as the possibility of other unforeseeable complications.  She understands the risks.  She wishes to take some time talking over with her husband before deciding whether to proceed.  Plan: Patient will call if she would like to schedule right VATS for lung biopsy  Melrose Nakayama, MD [facility] [phone]

## 2018-12-04 ENCOUNTER — Encounter: Payer: Self-pay | Admitting: Internal Medicine

## 2018-12-05 ENCOUNTER — Other Ambulatory Visit: Payer: Self-pay | Admitting: Internal Medicine

## 2018-12-06 NOTE — Telephone Encounter (Signed)
Can you write a letter for optum.

## 2018-12-08 ENCOUNTER — Encounter: Payer: Self-pay | Admitting: Internal Medicine

## 2018-12-09 DIAGNOSIS — M542 Cervicalgia: Secondary | ICD-10-CM | POA: Diagnosis not present

## 2018-12-09 DIAGNOSIS — M25511 Pain in right shoulder: Secondary | ICD-10-CM | POA: Diagnosis not present

## 2018-12-09 DIAGNOSIS — M25512 Pain in left shoulder: Secondary | ICD-10-CM | POA: Diagnosis not present

## 2018-12-22 ENCOUNTER — Other Ambulatory Visit: Payer: Self-pay

## 2018-12-22 ENCOUNTER — Ambulatory Visit: Payer: Medicare Other | Admitting: Internal Medicine

## 2018-12-22 ENCOUNTER — Encounter: Payer: Self-pay | Admitting: Internal Medicine

## 2018-12-22 ENCOUNTER — Telehealth: Payer: Self-pay | Admitting: Internal Medicine

## 2018-12-22 VITALS — BP 122/70 | HR 66 | Ht 66.0 in | Wt 149.8 lb

## 2018-12-22 DIAGNOSIS — J849 Interstitial pulmonary disease, unspecified: Secondary | ICD-10-CM | POA: Diagnosis not present

## 2018-12-22 NOTE — Progress Notes (Signed)
IOV 12/04/2017: Dr Lamonte Sakai: [name] is a 78 year old never smoker with a history of allergic rhinitis, GERD, hypothyroidism, MVP.  She is been seen in our office in the past by Dr. Gwenette Greet for restless leg syndrome-she still treated for this with Requip. On allegra, nexium prn.   She is referred today for dyspnea.  She reports that she has had an exercise routine where she walked w her husband 2 miles, exercises at the [facility], for several years. She has noticed more SOB, especially with mild hills. Sometimes now has to stop briefly to complete the walk. Occasional chest tightness, no wheeze. No overt CP. She reports daily cough, often in the am, sometimes clear mucous. She is on allegra. She had a walking stress test 09/2014 > reassuring ECG w exercise. Weight has been stable. Her last TSH was 06/25/17, 0.73.   Review of her notes shows hx possible GGI on prior CT 2014.   ROV 02/26/18 [name] --78 year old never smoker with a history of mitral valve prolapse whom I saw earlier this year for exertional dyspnea and chronic cough.  Pulmonary function testing consistent with restriction.  She has GERD and allergic rhinitis both of which could be contributing to her chronic cough.  I performed a high-resolution CT scan of the chest on 1/20 and reviewed today, this shows some patchy confluent subpleural reticular disease and groundglass attenuation with some minimal traction bronchiectasis and no frank honeycomb change.  This reflects a progression compared with 2014 and is an NSIP pattern.  Walking oximetry at her last visit did not show any exertional desaturation. She is still coughing, is using fexofenadine, takes nexium qod.   [place], had pesticide exposure.   ROV 08/26/2018 [name] --follow-up visit for 78 year old woman with a history of mitral valve prolapse, chronic cough, dyspnea with restrictive lung disease noted on pulmonary function testing.  High Res CT scan of the chest shows  some mild interstitial disease.  She was also having cough - better with flonase; still on nexium, allegra. Her exertional tolerance is improved, she has been exercising more. She remains on Requip for RLS.   We performed autoimmune labs last time, ANA positive at low titer (1: 80), SSA and SSB negative, RNP negative, RF negative, CCP negative, Anti-Smith negative, anti-SCL negative, DS DNA negative, anti-Jo negative, aldolase negative. She hasn't been seen in ILD clinic yet.    OV 10/08/2018  Subjective:  Patient ID: Elizabeth Espinoza, female , DOB: 30-Aug-1940 , age 54 y.o. , MRN: [id number] , ADDRESS: [place] [address]   10/08/2018 -   Chief Complaint  Patient presents with   Interstitial Lung Disease    Breathing the same as it was during August 2020 office visit with Dr. Lamonte Sakai     HPI Elizabeth Espinoza 78 y.o. -has been referred to the interstitial lung disease clinic because of findings of interstitial lung disease.  History is gathered from talking to her, review of Dr. Collene Gobble notes and also the integrated ILD questionnaire.  Briefly, she tells me that she was working out at [employer] and would just notice occasional dyspnea but she really did not compare it with other people.  Then in October 2019 she started walking 3 miles daily except on the days it rains with a husband.  During this time she noticed that she was falling really behind because of shortness of breath and exertional fatigue.  This resulted in subsequent  evaluation all documented above.  Findings of interstitial lung disease with subpleural reticulation suggestive of an alternative diagnosis.  Autoimmune profile essentially negative other than trace positive ANA.  She tells me that her significant major problem is exertional dyspnea when walking stairs or walking several miles.  She did not desaturate in our office several months ago.  She does not know if she desaturates when she exerts walking 3  miles.  Pulmonary function test earlier this year is just isolated low DLCO.  She also has like a cough.  Overall since the onset of the symptoms by exercising herself more and conditioning she is somewhat better.  She did see Dr. Kirk Ruths in May 2020 and in June 2020 had a coronary calcium CT which appears to have no calcium deposits.   [facility] Integrated Comprehensive ILD Questionnaire  Symptoms:  -Dyspnea started suddenly and since it started it is better.  She says it is been present for years although she did tell me that it is only there since October 2019 when she noticed that.  Severity is listed below.  She does have a cough almost 1 year.  Since it started it is better.  It is mostly in the morning.  She does bring up some phlegm.  Early on in the morning it is green or yellow.  Since it started it is the same/better.  There is no wheezing.  She does have some chest tightness with this when she walks.  It is relieved by rest.  Cardiac work-up in June 2020 showed no calcium deposits.   Past Medical History : Positive for chronic longstanding acid reflux disease and thyroid disease..  In addition CT scan from January 2020 shows hiatal hernia that is small..  This presence of  sclerosis in the bony structures in January 2020.-Primary care physician has been sent a message today.  There is no asthma or COPD or heart failure rheumatoid arthritis or collagen vascular disease.  She does have GERD and hiatal hernia for several years to decades.  No sleep apnea.  No blood clots.  No hepatitis.  No tuberculosis.  No pleurisy.  , ANA positive at low titer (1: 80), SSA and SSB negative, RNP negative, RF negative, CCP negative, Anti-Smith negative, anti-SCL negative, DS DNA negative, anti-Jo negative, aldolase negative. She hasn't been seen in ILD clinic yet.    ROS:  -She does have fatigue for the last several years.  She does have some back and hip issues.  She does have dry eyes.  She does have  like some dysphagia.  There is presence of hiatal hernia-there is a small.  Acid reflux for several decades.  She also reports presence of nonspecific rash   FAMILY HISTORY of LUNG DISEASE: * -Her father died of mesothelioma in nineteen 83/1984 at the age of 8 otherwise no lung disease.   EXPOSURE HISTORY:   -When she was 17 she smokes cigarettes but otherwise no cigarette or tobacco use or electronic cigarette.  Never smoked marijuana.  No cocaine use no intravenous drug use.   HOME and HOBBY DETAILS :  -Single[place]family home [place] setting for the last 16 years in a 78 year old home.  No mold or mildew exposure in the [facility] duct or CPAP mask or humidifier.  No mold or mildew in the bathroom.  No pet birds in the house.  No misting [place].  No feather pillows no feather duvet.  No musical instruments.  She does some occasional gardening which she likes.  She does do some fine-needle work.   OCCUPATIONAL HISTORY (122 questions) :  = Essentially negative except for the fact when she was a child she did some tobacco growing.  She has done home gardening for 50 years.    PULMONARY TOXICITY HISTORY (27 items):  denies  Results for Elizabeth, Espinoza (MRN [id number]) as of 11/07/2018 11:20  Ref. Range 02/26/2018 12:17  Anti Nuclear Antibody (ANA) Latest Ref Range: NEGATIVE  POSITIVE (A)  ANA Pattern 1 Unknown Nuclear, Speckled (A)  ANA Titer 1 Latest Units: titer 1:80 (H)  Anti JO-1 Latest Ref Range: 0.0 - 0.9 AI <7.6  Cyclic Citrullin Peptide Ab Latest Units: UNITS <16  ds DNA Ab Latest Units: IU/mL <1  RA Latex Turbid. Latest Ref Range: <14 IU/mL <14  ENA SM Ab Ser-aCnc Latest Ref Range: <1.0 NEG AI <1.0 NEG  Ribonucleic Protein(ENA) Antibody, IgG Latest Ref Range: <1.0 NEG AI <1.0 NEG  SSA (Ro) (ENA) Antibody, IgG Latest Ref Range: <1.0 NEG AI <1.0 NEG  SSB (La) (ENA) Antibody, IgG Latest Ref Range: <1.0 NEG AI <1.0 NEG  Scleroderma (Scl-70) (ENA) Antibody, IgG Latest Ref Range: <1.0 NEG AI  <1.0 NEG   Results for TARENA, GOCKLEY (MRN [id number]) as of 11/07/2018 11:20  Ref. Range 01/29/2018 14:31  FVC-Pre Latest Units: L 2.97  FVC-%Pred-Pre Latest Units: % 98  Results for Elizabeth, Espinoza (MRN [id number]) as of 11/07/2018 11:20  Ref. Range 01/29/2018 14:31  DLCO unc Latest Units: ml/min/mmHg 14.80  DLCO unc % pred Latest Units: % 54    OV 11/07/2018  Subjective:  Patient ID: Elizabeth Espinoza, female , DOB: December 17, 1940 , age 89 y.o. , MRN: [id number] , ADDRESS: [place] [address]   11/07/2018 -   Chief Complaint  Patient presents with   Follow-up    Patient reports that she has sob with any exertion.    Follow-up interstitial lung disease  HPI Elizabeth Espinoza 78 y.o. -last seen September 2020.  After that she was supposed to have follow-up high-resolution CT chest and spirometry DLCO.  For some reason the spirometry DLCO is not done.  In the interim her symptoms remain the same as shown by the symptom score below.  She does note when she does heavy exertion such as climbing stairs or long uphill walks her pulse ox drops to 84% but quickly regains.  She is not interested in portable oxygen.  She had a repeat high-resolution CT chest in September 2020 and when compared to January 2020 there is no significant change.  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  I personally visualized the film.  There is diffuse bilateral subpleural reticulation and some traction bronchiectasis.  I myself would say it may be this is indeterminate for UIP.  But there is some minimal air trapping and she is done some gardening work.  There is no upper zonal predominance that would fit in with hypersensitive pneumonitis.  She has incidental findings of bony sclerosis that is again repeated in the CT scan.  Her primary care physician Billey Gosling evaluated her.  I reviewed the note.  She is been referred to  Dr. Narda Rutherford in hematology who she is seeing November 12, 2018.  She is very confused about the fact that she has a bony sclerosis problem for which she is being referred to a blood doctor [hematologist] and she thought she is having a blood  test that was ordered by me.  I reviewed the chart and clarified this concepts   Ct Chest High Resolution  Result Date: 11/05/2018 CLINICAL DATA:  78 year old female with history of interstitial lung disease. Increased shortness of breath and cough over the past year. EXAM: CT CHEST WITHOUT CONTRAST TECHNIQUE: Multidetector CT imaging of the chest was performed following the standard protocol without intravenous contrast. High resolution imaging of the lungs, as well as inspiratory and expiratory imaging, was performed. COMPARISON:  High-resolution chest CT 02/10/2018. FINDINGS: Cardiovascular: Heart size is normal. There is no significant pericardial fluid, thickening or pericardial calcification. There is aortic atherosclerosis, as well as atherosclerosis of the great vessels of the mediastinum and the coronary arteries, including calcified atherosclerotic plaque in the left anterior descending and right coronary arteries. Mild calcifications of the mitral annulus. Mediastinum/Nodes: No pathologically enlarged mediastinal or hilar lymph nodes. Please note that accurate exclusion of hilar adenopathy is limited on noncontrast CT scans. Esophagus is unremarkable in appearance. No axillary lymphadenopathy. Lungs/Pleura: High-resolution images demonstrates some patchy areas of peripheral predominant septal thickening and subpleural reticulation, with mild cylindrical bronchiectasis and peripheral bronchiolectasis. No frank honeycombing confidently identified at this time. These findings have no definitive craniocaudal gradient. In the periphery of the mid to upper lungs there also some plaque-like areas of apparent pleuroparenchymal scarring and volume loss. Inspiratory and  expiratory imaging demonstrates minimal air trapping indicative of very mild small airways disease. Overall, these imaging findings appear stable compared to the prior study. Upper Abdomen: Aortic atherosclerosis. Musculoskeletal: Mild diffuse sclerosis throughout the visualized axial and appendicular skeleton, similar to prior examinations. There are no definite focal aggressive appearing lytic or blastic lesions noted in the visualized portions of the skeleton. IMPRESSION: 1. 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 2. Aortic atherosclerosis, in addition to 2 vessel coronary artery disease. Assessment for potential risk factor modification, dietary therapy or pharmacologic therapy may be warranted, if clinically indicated. 3. Persistent mild diffuse sclerosis throughout the visualized osseous structures without discrete aggressive appearing osseous lesions. Clinical correlation for signs and symptoms of potential infiltrative process such as myelofibrosis is suggested. Aortic Atherosclerosis (ICD10-I70.0). Electronically Signed   By: Vinnie Langton M.D.   On: 11/05/2018 14:36     OV 12/22/2018  Subjective:  Patient ID: Elizabeth Espinoza, female , DOB: 1940-06-01 , age 85 y.o. , MRN: [id number] , ADDRESS: [place] [address]   12/22/2018 -   Chief Complaint  Patient presents with   Follow-up    Pt states she has been doing good since last visit. Pt is still coughing and will get up clear mucus in the morning.   Follow-up interstitial lung disease with CT scan October 2020 being indeterminate versus alternate diagnosis.  History of gardening.  Trace autoimmune ANA  positive.  Mild progression since 2014  HPI Elizabeth Espinoza 78 y.o. -returns for follow-up.  She presents with her husband who I am meeting for the first time.  In the interim she met Dr. Roxan Hockey thoracic surgeon for surgical lung biopsy.  Her husband also met with him.  I reviewed the note.  She tells me that given the morbidity with surgical lung biopsy she wants to undergo bronchoscopy with  lavage and transbronchial biopsy first.  She understands the inherent limitations of these procedure in terms of diagnosis.  But she wants to take the lower risk profile.  Overall she feels stable.  Her symptom score is improved compared to the past.  Her walking desaturation test shows exaggerated drop in pulse ox but this was done with her wearing the mask.  She did not feel any dyspnea.  In terms of her bony sclerosis she has seen Dr. Lorenso Courier at [facility].  I reviewed the note.  He has reassured her.  Risks of pneumothorax, hemothorax, sedation/anesthesia complications such as cardiac or respiratory arrest or hypotension, stroke and bleeding all explained. Benefits of diagnosis but limitations of non-diagnosis also explained. Patient verbalized understanding and wished to proceed.   They want to have the bronchoscopy after the holidays of Christmas and new year.  This is because their house is undergoing remodeling currently.       SYMPTOM SCALE - ILD      O2 use 10/08/2018  11/07/2018  12/22/2018   Shortness of Breath 0 -> 5 scale with 5 being worst (score 6 If unable to do)  ra  At rest 0 0 0  Simple tasks - showers, clothes change, eating, shaving 1 1 0  Household (dishes, doing bed, laundry) _0 Shopping 2 2 0.5  Walking level at own pace 2 1 0.5  Walking keeping up with others of same age 100 2 0  Walking up Stairs _1 Walking up Hill 5 5 3.5  Total (40 - 48) Dyspnea Score 20 18 9.52.5  How bad is your cough? 1 3 2.5  How bad is your fatigue 2 2.5 1.5 am, 4.5 pm      Simple office walk 185 feet x  3 laps goal with forehead probe 10/08/2018  11/07/2018  12/22/2018   O2 used ra ra ra - wor mask  Number laps completed _2 Comments about pace good fast Mod pace  Resting Pulse Ox/HR 98% and 77/min 100% and 62/min 97% ad 70.min  Final Pulse Ox/HR 95% and 96/min 96% and 105/min 90%and 110/mi  Desaturated </= 88% no no no  Desaturated <= 3% points yes Yes, 4 ppoints Yes, 7 ponits  Got Tachycardic >/= 90/min yes yes yes  Symptoms at end of test none none none  Miscellaneous comments x  ? Mask related worsening      ROS - per HPI     has a past medical history of Allergic rhinitis, GERD (gastroesophageal reflux disease), Glaucoma, Hiatal hernia, Hyperlipidemia, Hypothyroidism, ILD (interstitial lung disease) ([place]), and MVP (mitral valve prolapse).   reports that she has never smoked. She has never used smokeless tobacco.  Past Surgical History:  Procedure Laterality Date   APPENDECTOMY     with [place]   negative   COLONOSCOPY  2005   Dr Olevia Perches   COLONOSCOPY  01/2013   Dr Collene Mares   esophageal dilation  07/04/2011   Dr Estrella Myrtle SIGMOIDOSCOPY  1999    Dr Olevia Perches   KNEE ARTHROSCOPY  2001, 2005   Dr Percell Miller   bilat   TOTAL ABDOMINAL HYSTERECTOMY     for Endometriosis; no BSO, Dr Colin Ina   UPPER GI ENDOSCOPY  01/2013   Dr Collene Mares    Allergies  Allergen Reactions   Caffeine     palpitations   Chlorpheniramine-Pseudoeph     palpitations  Maxifed     palpitations    Immunization History  Administered Date(s) Administered   Influenza Split 11/07/2010   Influenza Whole 10/30/2009, 11/27/2011   Influenza, High Dose Seasonal PF 10/27/2012, 11/05/2013, 10/01/2017, 09/11/2018   Influenza-Unspecified 11/24/2014, 10/07/2015, 10/08/2016   Pneumococcal Conjugate-13 10/07/2015   Pneumococcal Polysaccharide-23 07/02/2017   Tdap 02/11/2012   Zoster 01/09/2012   Zoster Recombinat  (Shingrix) 10/01/2017, 12/03/2017    Family History  Problem Relation Age of Onset   Lung cancer Father        Asbestos with mesothelioma   Heart disease Paternal Grandmother        ? etiology   Diabetes Paternal Grandmother    Diabetes Brother    Uterine cancer Paternal Aunt    Stroke Paternal Grandfather        > 69   Heart attack Brother 29   Allergies Sister    Aneurysm Mother        congenital   Stroke Brother        two strokes   Barrett's esophagus Brother      Current Outpatient Medications:    aspirin EC 81 MG tablet, Take 1 tablet (81 mg total) by mouth daily., Disp: 90 tablet, Rfl: 3   Biotin 1000 MCG tablet, Take 1,000 mcg by mouth daily., Disp: , Rfl:    Cholecalciferol (VITAMIN D-3 PO), Take by mouth daily., Disp: , Rfl:    esomeprazole (NEXIUM) 20 MG capsule, Take 1 capsule (20 mg total) by mouth daily at 12 noon. (Patient taking differently: Take 20 mg by mouth daily as needed. ), Disp: , Rfl:    Fexofenadine HCl (ALLEGRA PO), Take 1 tablet by mouth daily. , Disp: , Rfl:    MAGNESIUM CARBONATE PO, Take 1 tablet by mouth daily. , Disp: , Rfl:    metoprolol tartrate (LOPRESSOR) 50 MG tablet, TAKE 2 HOURS PRIOR TO CT SCAN (Patient taking differently: TAKE 2 HOURS PRIOR TO CT SCAN), Disp: 1 tablet, Rfl: 0   Multiple Vitamins-Minerals (ZINC PO), Take 1 tablet by mouth., Disp: , Rfl:    pravastatin (PRAVACHOL) 40 MG tablet, TAKE 1 TABLET BY MOUTH  DAILY, Disp: 90 tablet, Rfl: 2   Probiotic Product (PROBIOTIC DAILY PO), Take 1 capsule by mouth daily as needed. ULTRA FLORA , Disp: , Rfl:    rOPINIRole (REQUIP) 0.5 MG tablet, TAKE 2 TABLETS BY MOUTH  AFTER DINNER EACH NIGHT., Disp: 180 tablet, Rfl: 2   SYNTHROID 75 MCG tablet, TAKE 1 TABLET BY MOUTH  DAILY BEFORE BREAKFAST (Patient taking differently: Take 75 mcg by mouth daily before breakfast. Uses Name Brand), Disp: 90 tablet, Rfl: 2   tiZANidine (ZANAFLEX) 2 MG tablet, Take 1 tablet (2 mg total)  by mouth at bedtime as needed for muscle spasms., Disp: 30 tablet, Rfl: 0      Objective:   Vitals:   12/22/18 0953  BP: 122/70  Pulse: 66  SpO2: 100%  Weight: 149 lb 12.8 oz (67.9 kg)  Height: _0  (1.676 m)    Estimated body mass index is 24.18 kg/m as calculated from the following:   Height as of this encounter: _1  (1.676 m).   Weight as of this encounter: 149 lb 12.8 oz (67.9 kg).  _2 @  Filed Weights   12/22/18 0953  Weight: 149 lb 12.8 oz (67.9 kg)     Physical Exam  General Appearance:    Alert, cooperative, no distress, appears stated age - yes , Deconditioned looking - no ,  OBESE  - no, Sitting on Wheelchair -  no  Head:    Normocephalic, without obvious abnormality, atraumatic  Eyes:    PERRL, conjunctiva/corneas clear,  Ears:    Normal TM's and external ear canals, both ears  Nose:   Nares normal, septum midline, mucosa normal, no drainage    or sinus tenderness. OXYGEN ON  - no . Patient is @ ra   Throat:   Lips, mucosa, and tongue normal; teeth and gums normal. Cyanosis on lips - no  Neck:   Supple, symmetrical, trachea midline, no adenopathy;    thyroid:  no enlargement/tenderness/nodules; no carotid   bruit or JVD  Back:     Symmetric, no curvature, ROM normal, no CVA tenderness  Lungs:     Distress - no , Wheeze no, Barrell Chest - no, Purse lip breathing - no, Crackles - yes   Chest Wall:    No tenderness or deformity.    Heart:    Regular rate and rhythm, S1 and S2 normal, no rub   or gallop, Murmur - no  Breast Exam:    NOT DONE  Abdomen:     Soft, non-tender, bowel sounds active all four quadrants,    no masses, no organomegaly. Visceral obesity - no  Genitalia:   NOT DONE  Rectal:   NOT DONE  Extremities:   Extremities - normal, Has Cane - no, Clubbing - no, Edema - no  Pulses:   2+ and symmetric all extremities  Skin:   Stigmata of Connective Tissue Disease - no  Lymph nodes:   Cervical, supraclavicular, and axillary nodes normal   Psychiatric:  Neurologic:   Pleasant - yes, Anxious - no, Flat affect - no  CAm-ICU - neg, Alert and Oriented x 3 - yes, Moves all 4s - yes, Speech - normal, Cognition - intact           Assessment:     No diagnosis found.     Plan:     Patient Instructions  Interstitial lung disease ([place])   - The reason for your pulmonary fibrosis is unknown  - though stable compared to earlier in the year I have concern it can unpredictably get worse especially because there is progression compared to 2014.  -Respect decision to defer surgical lung biopsy given the morbidity -Support the decision to undergo bronchoscopy with lavage and transbronchial biopsies -We will also discuss your scan at our radiology conference in January 2021  -Best time for him to do this is January 12, 13th, 14th 2020 1 in the afternoon at [facility] scope, no TB risk, will need fluoroscopy for transbronchial biopsies   Bony sclerosis Glad Dr. Lorenso Courier has reassured you   Follow-up -2 spirometry and DLCO in early February 2021  -ILD clinic -30-minute slot in early February 2021 to discuss bronchoscopy results and spirometry results.   > 50% of this > 25 min visit spent in  face to face counseling or coordination of care - by this undersigned MD - Dr Brand Males. This includes one or more of the following documented above: discussion of test results, diagnostic or treatment recommendations, prognosis, risks and benefits of management options, instructions, education, compliance or risk-factor reduction   SIGNATURE    Dr. Brand Males, M.D., F.C.C.P,  Pulmonary and Critical Care Medicine Staff Physician, [place] Director - Interstitial Lung Disease  Program  Pulmonary [place] at [place], [place],  [place]  Pager: [phone], If no answer or between  15:00h - 7:00h: call [phone] Telephone: [phone]  10:40 AM 12/22/2018

## 2018-12-22 NOTE — Telephone Encounter (Signed)
Elizabeth Espinoza  Pls look at Jan 12-14, 2021 afternoon bronch at [facility]. She will need covid testing before and stop any blood thinners prior to procedure  She will need preop CBC and PT/PTT in 1 week prior to procedure  Thanks    SIGNATURE    Dr. Brand Males, M.D., F.C.C.P,  Pulmonary and Critical Care Medicine Staff Physician, [place] Director - Interstitial Lung Disease  Program  Pulmonary [place] at [place], [place], [address]  Pager: [phone], If no answer or between  15:00h - 7:00h: call [phone] Telephone: [phone]  10:39 AM 12/22/2018

## 2018-12-22 NOTE — Progress Notes (Signed)
HPI: FU dyspnea.  ETT September 2016 normal. CPX January 2020 showed normal functional capacity for age with duration 8:45; no ST changes; patient complained of chest pain, dyspnea and dizziness; there were other abnormal parameters suggesting cardiovascular limitation and also pulmonary impairment. Echocardiogram May 2020 showed normal LV function, grade 2 diastolic dysfunction and mild mitral valve prolapse.  Cardiac CTA June 2020 showed a calcium score of 0 and minimal nonobstructive coronary disease.  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  There was aortic atherosclerosis and two-vessel coronary artery atherosclerosis.  Since last seen the patient has dyspnea with more extreme activities but not with routine activities. It is relieved with rest. It is not associated with chest pain. There is no orthopnea, PND or pedal edema. There is no syncope or palpitations. There is no exertional chest pain.   Current Outpatient Medications  Medication Sig Dispense Refill  . aspirin EC 81 MG tablet Take 1 tablet (81 mg total) by mouth daily. 90 tablet 3  . Biotin 1000 MCG tablet Take 1,000 mcg by mouth daily.    . Cholecalciferol (VITAMIN D-3 PO) Take by mouth daily.    Marland Kitchen esomeprazole (NEXIUM) 20 MG capsule Take 1 capsule (20 mg total) by mouth daily at 12 noon. (Patient taking differently: Take 20 mg by mouth daily as needed. )    . Fexofenadine HCl (ALLEGRA PO) Take 1 tablet by mouth daily.     Marland Kitchen MAGNESIUM CARBONATE PO Take 1 tablet by mouth daily.     . metoprolol tartrate (LOPRESSOR) 50 MG tablet TAKE 2 HOURS PRIOR TO CT SCAN (Patient taking differently: TAKE 2 HOURS PRIOR TO CT SCAN) 1 tablet 0  . Multiple Vitamins-Minerals (ZINC PO) Take 1 tablet by mouth.    . pravastatin (PRAVACHOL) 40 MG tablet TAKE 1 TABLET BY MOUTH  DAILY 90 tablet 2  . Probiotic Product (PROBIOTIC DAILY PO) Take 1 capsule by mouth daily as needed. ULTRA FLORA     . rOPINIRole (REQUIP) 0.5 MG  tablet TAKE 2 TABLETS BY MOUTH  AFTER DINNER EACH NIGHT. 180 tablet 2  . SYNTHROID 75 MCG tablet TAKE 1 TABLET BY MOUTH  DAILY BEFORE BREAKFAST (Patient taking differently: Take 75 mcg by mouth daily before breakfast. Uses Name Brand) 90 tablet 2  . tiZANidine (ZANAFLEX) 2 MG tablet Take 1 tablet (2 mg total) by mouth at bedtime as needed for muscle spasms. 30 tablet 0   No current facility-administered medications for this visit.      Past Medical History:  Diagnosis Date  . Allergic rhinitis   . GERD (gastroesophageal reflux disease)   . Glaucoma    SUSPECT  . Hiatal hernia   . Hyperlipidemia    LDL goal = < 100  . Hypothyroidism   . ILD (interstitial lung disease) ([place])   . MVP (mitral valve prolapse)    documented on 2 D ECHO    Past Surgical History:  Procedure Laterality Date  . APPENDECTOMY     with TAH  . CARDIAC CATHETERIZATION  1989   negative  . COLONOSCOPY  2005   Dr Olevia Perches  . COLONOSCOPY  01/2013   Dr Collene Mares  . esophageal dilation  07/04/2011   Dr Olevia Perches  . FLEXIBLE SIGMOIDOSCOPY  1999    Dr Olevia Perches  . KNEE ARTHROSCOPY  2001, 2005   Dr Percell Miller   bilat  . TOTAL ABDOMINAL HYSTERECTOMY     for Endometriosis; no BSO, Dr Colin Ina  .  UPPER GI ENDOSCOPY  01/2013   Dr Collene Mares    Social History   Socioeconomic History  . Marital status: Married    Spouse name: Jeneen Rinks  . Number of children: 2  . Years of education: Not on file  . Highest education level: Not on file  Occupational History  . 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 Financial resource strain: Not hard at all  . Food insecurity    Worry: Never true    Inability: Never true  . Transportation needs    Medical: No    Non-medical: No  Tobacco Use  . Smoking status: Never Smoker  . Smokeless tobacco: Never Used  Substance and Sexual Activity  . Alcohol use: Yes    Comment:  rarely  . Drug use: No  . Sexual activity: Yes  Lifestyle  . Physical activity    Days per week:  5 days    Minutes per session: 50 min  . Stress: Not at all  Relationships  . Social connections    Talks on phone: More than three times a week    Gets together: More than three times a week    Attends religious service: More than 4 times per year    Active member of club or organization: Yes    Attends meetings of clubs or organizations: More than 4 times per year    Relationship status: Married  . Intimate partner violence    Fear of current or ex partner: No    Emotionally abused: No    Physically abused: No    Forced sexual activity: No  Other Topics Concern  . Not on file  Social History Narrative  . Not on file    Family History  Problem Relation Age of Onset  . Lung cancer Father        Asbestos with mesothelioma  . Heart disease Paternal Grandmother        ? etiology  . Diabetes Paternal Grandmother   . Diabetes Brother   . Uterine cancer Paternal Aunt   . Stroke Paternal Grandfather        > 52  . Heart attack Brother 37  . Allergies Sister   . Aneurysm Mother        congenital  . Stroke Brother        two strokes  . Barrett's esophagus Brother     ROS: no fevers or chills, productive cough, hemoptysis, dysphasia, odynophagia, melena, hematochezia, dysuria, hematuria, rash, seizure activity, orthopnea, PND, pedal edema, claudication. Remaining systems are negative.  Physical Exam: Well-developed well-nourished in no acute distress.  Skin is warm and dry.  HEENT is normal.  Neck is supple.  Chest is clear to auscultation with normal expansion.  Cardiovascular exam is regular rate and rhythm.  Abdominal exam nontender or distended. No masses palpated. Extremities show no edema. neuro grossly intact  A/P  1 dyspnea-this is likely related to interstitial lung disease.  Previous cardiac CTA showed minimal nonobstructive coronary disease.  Echocardiogram showed normal LV function.  2 coronary artery disease-minimal on most recent CTA.  Plan to continue  medical therapy with aspirin and statin.  3 hyperlipidemia-continue statin.  4 interstitial lung disease-Per pulmonary.  Kirk Ruths, MD

## 2018-12-22 NOTE — Patient Instructions (Addendum)
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  -Respect decision to defer surgical lung biopsy given the morbidity -Support the decision to undergo bronchoscopy with lavage and transbronchial biopsies -We will also discuss your scan at our radiology conference in January 2021  -Best time for him to do this is January 12, 13th, 14th 2020 1 in the afternoon at [place] scope, no TB risk, will need fluoroscopy for transbronchial biopsies   Bony sclerosis Glad Dr. Lorenso Courier has reassured you   Follow-up -2 spirometry and DLCO in early February 2021  -ILD clinic -30-minute slot in early February 2021 to discuss bronchoscopy results and spirometry results.

## 2018-12-23 ENCOUNTER — Encounter: Payer: Self-pay | Admitting: Internal Medicine

## 2018-12-23 DIAGNOSIS — M25511 Pain in right shoulder: Secondary | ICD-10-CM | POA: Diagnosis not present

## 2018-12-23 DIAGNOSIS — M6281 Muscle weakness (generalized): Secondary | ICD-10-CM | POA: Diagnosis not present

## 2018-12-23 DIAGNOSIS — M25512 Pain in left shoulder: Secondary | ICD-10-CM | POA: Diagnosis not present

## 2018-12-23 NOTE — Telephone Encounter (Signed)
Attempted to call Katie to get bronch scheduled for pt but unable to reach. Left message for her to return call.

## 2018-12-29 ENCOUNTER — Ambulatory Visit: Payer: Medicare Other | Admitting: Cardiology

## 2018-12-29 ENCOUNTER — Other Ambulatory Visit: Payer: Self-pay

## 2018-12-29 ENCOUNTER — Encounter: Payer: Self-pay | Admitting: Cardiology

## 2018-12-29 VITALS — BP 127/78 | HR 67 | Temp 97.7°F | Ht 66.0 in | Wt 164.4 lb

## 2018-12-29 DIAGNOSIS — I2584 Coronary atherosclerosis due to calcified coronary lesion: Secondary | ICD-10-CM

## 2018-12-29 DIAGNOSIS — R0602 Shortness of breath: Secondary | ICD-10-CM | POA: Diagnosis not present

## 2018-12-29 DIAGNOSIS — I2510 Atherosclerotic heart disease of native coronary artery without angina pectoris: Secondary | ICD-10-CM | POA: Diagnosis not present

## 2018-12-29 DIAGNOSIS — E782 Mixed hyperlipidemia: Secondary | ICD-10-CM | POA: Diagnosis not present

## 2018-12-29 NOTE — Patient Instructions (Signed)
Medication Instructions:  NO CHANGE *If you need a refill on your cardiac medications before your next appointment, please call your pharmacy*  Lab Work: If you have labs (blood work) drawn today and your tests are completely normal, you will receive your results only by: Marland Kitchen MyChart Message (if you have MyChart) OR . A paper copy in the mail If you have any lab test that is abnormal or we need to change your treatment, we will call you to review the results.  Follow-Up: At [facility], you and your health needs are our priority.  As part of our continuing mission to provide you with exceptional heart care, we have created designated Provider Care Teams.  These Care Teams include your primary Cardiologist (physician) and Advanced Practice Providers (APPs -  Physician Assistants and Nurse Practitioners) who all work together to provide you with the care you need, when you need it.  Your next appointment:   12 month(s)  The format for your next appointment:   Either In Person or Virtual  Provider:   You may see Kirk Ruths MD or one of the following Advanced Practice Providers on your designated Care Team:    Kerin Ransom, PA-C  [facility], [place]  Coletta Memos, [place]

## 2018-12-30 ENCOUNTER — Encounter: Payer: Self-pay | Admitting: Internal Medicine

## 2018-12-31 ENCOUNTER — Encounter: Payer: Self-pay | Admitting: Internal Medicine

## 2019-01-07 ENCOUNTER — Telehealth: Payer: Self-pay | Admitting: Internal Medicine

## 2019-01-07 DIAGNOSIS — J849 Interstitial pulmonary disease, unspecified: Secondary | ICD-10-CM

## 2019-01-07 NOTE — Telephone Encounter (Signed)
Spoke with Elizabeth Espinoza- bronch is set for 02/04/2018 at 1:00 pm at [facility]   Pt needs to arrive at 12:00 pm   Lab orders placed   I spoke with the pt and notified of appt date and time and she is fine with this  She is aware to come to the lab 1 wk prior   Will forward to [facility] for the covid testing   Thanks!

## 2019-01-07 NOTE — Telephone Encounter (Signed)
See other phone note dated 12/22/2018   I spoke with Elizabeth Espinoza and scheduled bronch for 02/04/2018 at 1:00 pm at [facility] placed to be done 1 wk prior   MR- when does she need to stop her ASA 81 mg ?

## 2019-01-08 NOTE — Telephone Encounter (Signed)
Covid test scheduled 02/02/19 pt aware

## 2019-01-12 ENCOUNTER — Other Ambulatory Visit: Payer: Self-pay | Admitting: Internal Medicine

## 2019-01-12 NOTE — Telephone Encounter (Signed)
MR - please advise. Thanks.

## 2019-01-15 NOTE — Telephone Encounter (Addendum)
Waiting on response from MR in regards to pt's ASA 47m when she needs to stop that prior to the bronch. MR is scheduled to be in clinic 12/28.

## 2019-01-20 NOTE — Telephone Encounter (Signed)
Called and spoke with pt letting her know to stop ASA 3 days prior to the bronch and she verbalized understanding. Also stated to pt if she could come by office 1/7 or 1/8 to have labwork done prior to the bronch and she said she would be by office. Orders have been placed for the labs.   Attempted to call Katie with resp to tell her the info from MR about pt's upcoming bronch but unable to reach her. Left message for Joellen Jersey to return call.

## 2019-01-20 NOTE — Telephone Encounter (Signed)
To be on the safe side stop aspirin 3 days before procedure although many times people do not stop it.  However I would like to stop it 3 days before the bronchoscopy.  In addition get CBC and PT PTT less than 1 week before procedure.   Please let the bronchoscopy respiratory therapist know that this is bronchoscopy with lavage and transbronchial biopsy therefore needs fluoroscopy.  Also let them know we are doing RNA genomic analysis and so they need to set up ENVISIA for this.

## 2019-01-21 NOTE — Telephone Encounter (Signed)
Elizabeth Espinoza returned call. I stated to her the info provided by MR and she verbalized understanding. Nothing further needed.

## 2019-01-29 ENCOUNTER — Other Ambulatory Visit (INDEPENDENT_AMBULATORY_CARE_PROVIDER_SITE_OTHER): Payer: Medicare Other

## 2019-01-29 DIAGNOSIS — Z961 Presence of intraocular lens: Secondary | ICD-10-CM | POA: Diagnosis not present

## 2019-01-29 DIAGNOSIS — J849 Interstitial pulmonary disease, unspecified: Secondary | ICD-10-CM | POA: Diagnosis not present

## 2019-01-29 DIAGNOSIS — H04123 Dry eye syndrome of bilateral lacrimal glands: Secondary | ICD-10-CM | POA: Diagnosis not present

## 2019-01-29 DIAGNOSIS — H26491 Other secondary cataract, right eye: Secondary | ICD-10-CM | POA: Diagnosis not present

## 2019-01-29 DIAGNOSIS — H40013 Open angle with borderline findings, low risk, bilateral: Secondary | ICD-10-CM | POA: Diagnosis not present

## 2019-01-29 LAB — CBC WITH DIFFERENTIAL/PLATELET
Basophils Absolute: 0.1 10*3/uL (ref 0.0–0.1)
Basophils Relative: 0.9 % (ref 0.0–3.0)
Eosinophils Absolute: 0.3 10*3/uL (ref 0.0–0.7)
Eosinophils Relative: 3.2 % (ref 0.0–5.0)
HCT: 36.6 % (ref 36.0–46.0)
Hemoglobin: 12 g/dL (ref 12.0–15.0)
Lymphocytes Relative: 27.9 % (ref 12.0–46.0)
Lymphs Abs: 2.9 10*3/uL (ref 0.7–4.0)
MCHC: 32.9 g/dL (ref 30.0–36.0)
MCV: 88.7 fl (ref 78.0–100.0)
Monocytes Absolute: 0.8 10*3/uL (ref 0.1–1.0)
Monocytes Relative: 8 % (ref 3.0–12.0)
Neutro Abs: 6.2 10*3/uL (ref 1.4–7.7)
Neutrophils Relative %: 60 % (ref 43.0–77.0)
Platelets: 257 10*3/uL (ref 150.0–400.0)
RBC: 4.13 Mil/uL (ref 3.87–5.11)
RDW: 14.6 % (ref 11.5–15.5)
WBC: 10.4 10*3/uL (ref 4.0–10.5)

## 2019-01-29 NOTE — Addendum Note (Signed)
Addended by: Suzzanne Cloud E on: 01/29/2019 10:41 AM   Modules accepted: Orders

## 2019-01-30 LAB — PT AND PTT
INR: 1 (ref 0.9–1.2)
Prothrombin Time: 10.3 s (ref 9.1–12.0)
aPTT: 25 s (ref 24–33)

## 2019-02-02 ENCOUNTER — Other Ambulatory Visit (HOSPITAL_COMMUNITY)
Admission: RE | Admit: 2019-02-02 | Discharge: 2019-02-02 | Disposition: A | Discharge status: Home / Self Care | Payer: Medicare Other | Source: Ambulatory Visit | Attending: Internal Medicine | Admitting: Internal Medicine

## 2019-02-02 DIAGNOSIS — Z01812 Encounter for preprocedural laboratory examination: Secondary | ICD-10-CM | POA: Insufficient documentation

## 2019-02-02 DIAGNOSIS — Z20822 Contact with and (suspected) exposure to covid-19: Secondary | ICD-10-CM | POA: Diagnosis not present

## 2019-02-03 LAB — NOVEL CORONAVIRUS, NAA (HOSP ORDER, SEND-OUT TO REF LAB; TAT 18-24 HRS): SARS-CoV-2, NAA: NOT DETECTED

## 2019-02-05 ENCOUNTER — Encounter: Payer: Self-pay | Admitting: Internal Medicine

## 2019-02-05 ENCOUNTER — Ambulatory Visit (HOSPITAL_COMMUNITY)
Admission: RE | Admit: 2019-02-05 | Discharge: 2019-02-05 | Disposition: A | Discharge status: Home / Self Care | Payer: Medicare Other | Attending: Internal Medicine | Admitting: Internal Medicine

## 2019-02-05 ENCOUNTER — Encounter (HOSPITAL_COMMUNITY): Payer: Self-pay | Admitting: Internal Medicine

## 2019-02-05 ENCOUNTER — Ambulatory Visit (HOSPITAL_COMMUNITY): Payer: Medicare Other

## 2019-02-05 ENCOUNTER — Encounter (HOSPITAL_COMMUNITY)
Admission: RE | Disposition: A | Discharge status: Home / Self Care | Payer: Self-pay | Source: Home / Self Care | Attending: Internal Medicine

## 2019-02-05 ENCOUNTER — Ambulatory Visit (HOSPITAL_COMMUNITY)
Admission: RE | Admit: 2019-02-05 | Discharge: 2019-02-05 | Disposition: A | Discharge status: Home / Self Care | Payer: Medicare Other | Source: Ambulatory Visit | Attending: Internal Medicine | Admitting: Internal Medicine

## 2019-02-05 DIAGNOSIS — J849 Interstitial pulmonary disease, unspecified: Secondary | ICD-10-CM | POA: Diagnosis not present

## 2019-02-05 DIAGNOSIS — Z9889 Other specified postprocedural states: Secondary | ICD-10-CM | POA: Diagnosis not present

## 2019-02-05 HISTORY — PX: VIDEO BRONCHOSCOPY: SHX5072

## 2019-02-05 LAB — BODY FLUID CELL COUNT WITH DIFFERENTIAL
Eos, Fluid: 1 %
Lymphs, Fluid: 18 %
Monocyte-Macrophage-Serous Fluid: 26 % — ABNORMAL LOW (ref 50–90)
Neutrophil Count, Fluid: 55 % — ABNORMAL HIGH (ref 0–25)
Total Nucleated Cell Count, Fluid: 124 cu mm (ref 0–1000)

## 2019-02-05 SURGERY — BRONCHOSCOPY, WITH FLUOROSCOPY
Anesthesia: Moderate Sedation | Laterality: Bilateral

## 2019-02-05 MED ORDER — PHENYLEPHRINE HCL 0.25 % NA SOLN: 1.0000 | Freq: Four times a day (QID) | NASAL | Status: DC | PRN

## 2019-02-05 MED ORDER — EPINEPHRINE 1 MG/10ML IJ SOSY
PREFILLED_SYRINGE | INTRAMUSCULAR | Status: DC | PRN
  Administered 2019-02-05: 0.1 mg via INTRAVENOUS

## 2019-02-05 MED ORDER — FENTANYL CITRATE (PF) 100 MCG/2ML IJ SOLN
INTRAMUSCULAR | Status: AC
  Filled 2019-02-05: qty 2

## 2019-02-05 MED ORDER — FENTANYL CITRATE (PF) 100 MCG/2ML IJ SOLN
INTRAMUSCULAR | Status: DC | PRN
  Administered 2019-02-05: 50 ug via INTRAVENOUS

## 2019-02-05 MED ORDER — BUTAMBEN-TETRACAINE-BENZOCAINE 2-2-14 % EX AERO: 1.0000 | INHALATION_SPRAY | Freq: Once | CUTANEOUS | Status: DC

## 2019-02-05 MED ORDER — LIDOCAINE HCL URETHRAL/MUCOSAL 2 % EX GEL: 1.0000 "application " | Freq: Once | CUTANEOUS | Status: DC

## 2019-02-05 MED ORDER — MIDAZOLAM HCL (PF) 5 MG/ML IJ SOLN
INTRAMUSCULAR | Status: AC
  Filled 2019-02-05: qty 1

## 2019-02-05 MED ORDER — LIDOCAINE HCL URETHRAL/MUCOSAL 2 % EX GEL
CUTANEOUS | Status: DC | PRN
  Administered 2019-02-05: 1

## 2019-02-05 MED ORDER — LIDOCAINE HCL 1 % IJ SOLN
INTRAMUSCULAR | Status: DC | PRN
  Administered 2019-02-05: 6 mL via RESPIRATORY_TRACT

## 2019-02-05 MED ORDER — PHENYLEPHRINE HCL 0.25 % NA SOLN
NASAL | Status: DC | PRN
  Administered 2019-02-05: 2 via NASAL

## 2019-02-05 MED ORDER — SODIUM CHLORIDE 0.9 % IV SOLN: INTRAVENOUS | Status: DC

## 2019-02-05 MED ORDER — MIDAZOLAM HCL (PF) 10 MG/2ML IJ SOLN
INTRAMUSCULAR | Status: DC | PRN
  Administered 2019-02-05: 2 mg via INTRAVENOUS

## 2019-02-05 NOTE — Progress Notes (Signed)
Video Bronchoscopy done Intervention bronchial washing done Intervention Bronchial biopsy done for "Envisia" testing sent out for processing. Procedure tolerated well

## 2019-02-05 NOTE — Op Note (Signed)
Name:  Elizabeth Espinoza MRN:  [id number] DOB:  07/19/1940  PROCEDURE NOTE  Procedure(s): Flexible bronchoscopy [phone]) Bronchial alveolar lavage [phone]) of the Right upper lobe  Transbronchial lung biopsy, single lobe (94327) of the right lower lobe x 3 pieces Transbronchial lung biopsy, additional lobe (61470) of the right upper lobe x 2 pieces   Indications:  ILD  Consent:  Procedure, benefits, risks and alternatives discussed.  Questions answered.  Consent obtained.  Anesthesia:  Moderate Sedation  Location: [place] bronch suite fo r outpatient bronch  Procedure summary:  Appropriate equipment was assembled.  The patient was brought to the procedure suite room and identified as [name] with 05/26/1940  Safety timeout was performed. The patient was placed supine on the  table, airway and moderate sedation administered by this operator  After the appropriate level of moderation was assured, flexible video bronchoscope was lubricated and inserted through the endotracheal tube.  Total of 24 mL of 1% Lidocaine were administered through the bronchoscope to augment moderate sedation  Airway examination was yes performed bilaterally to subsegmental level.  Minimal clear secretions were noted, mucosa appeared normal and no endobronchial lesions were identified. However, significant veins noted across vocal cords, trachea and bronchi in sub-mucosal area. This was unusual but did not bleed  Bronchial alveolar lavage of the RUL was performed with 20 mL of normal saline discarded and then 40 mL x 3  number of times for total volume of 120 mL. Total return of 40 mL of fluid, after which the bronchoscope was withdrawn.   Flexible video bronchoscope was used again to perform TRANSBRONHIAL biopsies of   - Right Lower LOBE x 3 times and UPPER LOBE x Right x  2 times for ENVISIA CLASSIFIER GENOMIC ANALYSIS  Noted : 1:10,000 epi diluted in 20 mL saline -03 cc of this was applied to RUL and  RLL before biopsies to pre-empt bleeding  After hemostasis was assure, the bronchoscope was withdrawn.  The patient was recovered and then  transferred to recovery area  Post-procedure chest x-ray was YES ordered.  Specimens sent: Bronchial alveolar lavage specimen of the RUL for cell count,  microbiology and cytology.  Complications:  No immediate complications were noted.  Hemodynamic parameters and oxygenation remained stable throughout the procedure.  Estimated blood loss:  none  IMPRESSION 1. normnal hyperemic airway 2. RUL BAL 3. Clabe Seal classifer sent - YES  Followup Future Appointments  Date Time Provider [place]  03/16/2019  9:00 AM [facility]PFT RM [facility] None  03/16/2019 10:15 AM Brand Males, MD [facility] None     Dr. Brand Males, M.D., [facility].C.P Pulmonary and Critical Care Medicine Staff Physician [place] [facility] Pager: [phone], If no answer or between  15:00h - 7:00h: call [phone]  02/05/2019 1:54 PM

## 2019-02-05 NOTE — Discharge Instructions (Signed)
Flexible Bronchoscopy, Care After This sheet gives you information about how to care for yourself after your test. Your doctor may also give you more specific instructions. If you have problems or questions, contact your doctor. Follow these instructions at home: Eating and drinking  Do not eat or drink anything (not even water) for 2 hours after your test, or until your numbing medicine (local anesthetic) wears off.  When your numbness is gone and your cough and gag reflexes have come back, you may: ? Eat only soft foods. ? Slowly drink liquids.  The day after the test, go back to your normal diet. Driving  Do not drive for 24 hours if you were given a medicine to help you relax (sedative).  Do not drive or use heavy machinery while taking prescription pain medicine. General instructions   Take over-the-counter and prescription medicines only as told by your doctor.  Return to your normal activities as told. Ask what activities are safe for you.  Do not use any products that have nicotine or tobacco in them. This includes cigarettes and e-cigarettes. If you need help quitting, ask your doctor.  Keep all follow-up visits as told by your doctor. This is important. It is very important if you had a tissue sample (biopsy) taken. Get help right away if:  You have shortness of breath that gets worse.  You get light-headed.  You feel like you are going to pass out (faint).  You have chest pain.  You cough up: ? More than a little blood. ? More blood than before. Summary  Do not eat or drink anything (not even water) for 2 hours after your test, or until your numbing medicine wears off.  Do not use cigarettes. Do not use e-cigarettes.  Get help right away if you have chest pain. This information is not intended to replace advice given to you by your health care provider. Make sure you discuss any questions you have with your health care provider. Document Revised: 12/21/2016  Document Reviewed: 01/27/2016 Elsevier Patient Education  2020 [employer].  Nothing to eat or drink until  3:45   pm today 02/05/2019 Any questions or concerns please call the office at [phone]  Future Appointments  Date Time Provider [place]  03/16/2019  9:00 AM [facility]-PFT RM [facility] None  03/16/2019 10:15 AM Brand Males, MD [facility] None

## 2019-02-05 NOTE — H&P (Signed)
Elizabeth Espinoza [id number] 05/26/1940   S: She has interstitial lung disease of indeterminate variety.  Last seen in the office over 30 days ago.  Bronchoscopy with lavage and transbronchial biopsy for any genomic analysis versus surgical lung biopsy was discussed.  She opted to proceed with the bronchoscopy route because of the less risk even though the diagnostic uncertainty was higher.  She therefore presents for today.  Her predetermined plan was to present for it after the New Year's.  In the interim she reports no new complaints.  She is NPO.  She stopped aspirin a few days ago   Vital signs reviewed and normal    Exam is essentially unchanged.  Lungs overall clear.  Last blood work on January 29, 2019 shows normal platelets hemoglobin and INR.  High-resolution CT chest October 2020: Is indeterminate for UIP.  Last pulmonary function test January 2020 shows reduction in diffusion capacity to 54% otherwise normal.  Assessment Interstitial lung disease idiopathic unclear specific variety  Plan -Bronchoscopy with lavage followed by transbronchial biopsy under fluoroscopy for any genomic analysis   Risks of pneumothorax, hemothorax, sedation/anesthesia complications such as cardiac or respiratory arrest or hypotension, stroke and bleeding all explained. Benefits of diagnosis but limitations of non-diagnosis also explained. Patient verbalized understanding and wished to proceed.       SIGNATURE    Dr. Brand Males, M.D., F.C.C.P,  Pulmonary and Critical Care Medicine Staff Physician, [place] [facility] Director - Interstitial Lung Disease  Program  Pulmonary [place] at [place], [place], [address]  Pager: [phone], If no answer or between  15:00h - 7:00h: call [phone] Telephone: [phone]  1:11 PM 02/05/2019

## 2019-02-06 LAB — PNEUMOCYSTIS JIROVECI SMEAR BY DFA: Pneumocystis jiroveci Ag: NEGATIVE

## 2019-02-06 LAB — CYTOLOGY - NON PAP

## 2019-02-06 LAB — ACID FAST SMEAR (AFB, MYCOBACTERIA): Acid Fast Smear: NEGATIVE

## 2019-02-07 LAB — CULTURE, RESPIRATORY W GRAM STAIN: Culture: NORMAL

## 2019-02-09 LAB — MTB RIF NAA NON-SPUTUM, W/O CULTURE

## 2019-02-18 DIAGNOSIS — J84111 Idiopathic interstitial pneumonia, not otherwise specified: Secondary | ICD-10-CM | POA: Diagnosis not present

## 2019-02-23 ENCOUNTER — Telehealth: Payer: Self-pay | Admitting: Internal Medicine

## 2019-02-23 NOTE — Telephone Encounter (Signed)
Called and spoke with pt letting her know that MR has not resulted the bronch. Stated to her as soon as he lets [place] know the results for the bronch we would call her to let her know what he says. Pt said if MR is able to call her to discuss the results of the bronch, she would appreciate it.  Pt stated if she could not be reached on her phone to call husband's phone. Both numbers are listed. Routing to MR.

## 2019-02-23 NOTE — Telephone Encounter (Signed)
ATC and did not get an answer. LMTCB. Not sure what results she is referring to and do not see where Dr. Chase Caller has resulted any.

## 2019-02-23 NOTE — Telephone Encounter (Signed)
Patient states would like results from [place] done on 02/05/2019.  Patient phone number is [phone].

## 2019-02-25 DIAGNOSIS — L668 Other cicatricial alopecia: Secondary | ICD-10-CM | POA: Diagnosis not present

## 2019-02-25 DIAGNOSIS — L661 Lichen planopilaris: Secondary | ICD-10-CM | POA: Diagnosis not present

## 2019-02-25 NOTE — Telephone Encounter (Signed)
The envisia just got faced 02/25/2019 AM - could you please get it for me and I can review it 02/26/19

## 2019-02-27 NOTE — Telephone Encounter (Signed)
I have received the fax of pt's results from envisia. Will place on MR's desk for him to review.

## 2019-03-02 NOTE — Telephone Encounter (Signed)
MR is back in the office on 03/10/2019.

## 2019-03-06 DIAGNOSIS — L661 Lichen planopilaris: Secondary | ICD-10-CM | POA: Diagnosis not present

## 2019-03-08 LAB — FUNGUS CULTURE WITH STAIN

## 2019-03-08 LAB — FUNGUS CULTURE RESULT

## 2019-03-08 LAB — FUNGAL ORGANISM REFLEX

## 2019-03-08 NOTE — Progress Notes (Signed)
All cultures negative. Pls ensure ENVISIA biopsy result from [place] is on my desk

## 2019-03-11 NOTE — Telephone Encounter (Signed)
Called and spoke to pt. Informed her of the results and recs per MR. Pt aware to keep all upcoming appts. Nothing further needed at this time.

## 2019-03-11 NOTE — Telephone Encounter (Signed)
Finally able to review esults of envisia send out test to [place]. Date of test is 02/05/2019. 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 Brand Males, M.D., F.C.C.P,  Pulmonary and Critical Care Medicine Staff Physician, [place] Director - Interstitial Lung Disease  Program  Pulmonary [place] at [place], [place], [address]  Pager: [phone], If no answer or between  15:00h - 7:00h: call [phone] Telephone: [phone]  10:21 AM 03/11/2019

## 2019-03-13 ENCOUNTER — Other Ambulatory Visit (HOSPITAL_COMMUNITY)
Admission: RE | Admit: 2019-03-13 | Discharge: 2019-03-13 | Disposition: A | Discharge status: Home / Self Care | Payer: Medicare Other | Source: Ambulatory Visit | Attending: Internal Medicine | Admitting: Internal Medicine

## 2019-03-13 DIAGNOSIS — Z20822 Contact with and (suspected) exposure to covid-19: Secondary | ICD-10-CM | POA: Insufficient documentation

## 2019-03-13 DIAGNOSIS — Z01812 Encounter for preprocedural laboratory examination: Secondary | ICD-10-CM | POA: Insufficient documentation

## 2019-03-13 LAB — SARS CORONAVIRUS 2 (TAT 6-24 HRS): SARS Coronavirus 2: NEGATIVE

## 2019-03-14 ENCOUNTER — Ambulatory Visit: Payer: Medicare Other | Attending: Internal Medicine

## 2019-03-14 DIAGNOSIS — Z23 Encounter for immunization: Secondary | ICD-10-CM | POA: Insufficient documentation

## 2019-03-14 NOTE — Progress Notes (Signed)
   Covid-19 Vaccination Clinic  Name:  Elizabeth Espinoza    MRN: [id number] DOB: 1940/08/13  03/14/2019  [name] was observed post Covid-19 immunization for 15 minutes without incidence. She was provided with Vaccine Information Sheet and instruction to access the V-Safe system.   [name] was instructed to call 911 with any severe reactions post vaccine: Marland Kitchen Difficulty breathing  . Swelling of your face and throat  . A fast heartbeat  . A bad rash all over your body  . Dizziness and weakness    Immunizations Administered    Name Date Dose VIS Date Route   Pfizer COVID-19 Vaccine 03/14/2019  8:59 AM 0.3 mL 01/02/2019 Intramuscular   Manufacturer: [place]   Lot: [id number]   [place]: [id number]

## 2019-03-16 ENCOUNTER — Ambulatory Visit (INDEPENDENT_AMBULATORY_CARE_PROVIDER_SITE_OTHER): Payer: Medicare Other | Admitting: Internal Medicine

## 2019-03-16 ENCOUNTER — Telehealth: Payer: Self-pay | Admitting: Pharmacy Technician

## 2019-03-16 ENCOUNTER — Encounter: Payer: Self-pay | Admitting: Internal Medicine

## 2019-03-16 ENCOUNTER — Other Ambulatory Visit: Payer: Self-pay

## 2019-03-16 ENCOUNTER — Ambulatory Visit: Payer: Medicare Other | Admitting: Internal Medicine

## 2019-03-16 VITALS — BP 124/70 | HR 59 | Ht 66.0 in | Wt 151.0 lb

## 2019-03-16 DIAGNOSIS — J84112 Idiopathic pulmonary fibrosis: Secondary | ICD-10-CM

## 2019-03-16 DIAGNOSIS — J849 Interstitial pulmonary disease, unspecified: Secondary | ICD-10-CM | POA: Diagnosis not present

## 2019-03-16 DIAGNOSIS — Z5181 Encounter for therapeutic drug level monitoring: Secondary | ICD-10-CM

## 2019-03-16 LAB — PULMONARY FUNCTION TEST
DL/VA % pred: 73 %
DL/VA: 2.96 ml/min/mmHg/L
DLCO cor % pred: 63 %
DLCO cor: 12.91 ml/min/mmHg
DLCO unc % pred: 60 %
DLCO unc: 12.32 ml/min/mmHg
FEF 25-75 Pre: 2.19 L/sec
FEF2575-%Pred-Pre: 130 %
FEV1-%Pred-Pre: 102 %
FEV1-Pre: 2.29 L
FEV1FVC-%Pred-Pre: 105 %
FEV6-%Pred-Pre: 102 %
FEV6-Pre: 2.89 L
FEV6FVC-%Pred-Pre: 104 %
FVC-%Pred-Pre: 97 %
FVC-Pre: 2.91 L
Pre FEV1/FVC ratio: 79 %
Pre FEV6/FVC Ratio: 100 %

## 2019-03-16 LAB — HEPATIC FUNCTION PANEL
ALT: 12 U/L (ref 0–35)
AST: 16 U/L (ref 0–37)
Albumin: 4.4 g/dL (ref 3.5–5.2)
Alkaline Phosphatase: 96 U/L (ref 39–117)
Bilirubin, Direct: 0.1 mg/dL (ref 0.0–0.3)
Total Bilirubin: 0.6 mg/dL (ref 0.2–1.2)
Total Protein: 7.1 g/dL (ref 6.0–8.3)

## 2019-03-16 NOTE — Telephone Encounter (Signed)
Received notification from [facility] regarding a prior authorization for ESBRIET. Authorization has been APPROVED from 03/16/19 to 01/22/20.   Authorization # [id number] Phone # [phone]  Ran test claim, patient's copay for 1 month is $2,764.06.  4:13 PM Beatriz Chancellor, CPhT

## 2019-03-16 NOTE — Progress Notes (Signed)
IOV 12/04/2017: Dr Lamonte Sakai: Ms. Kaigler is a 79 year old never smoker with a history of allergic rhinitis, GERD, hypothyroidism, MVP.  She is been seen in our office in the past by Dr. Gwenette Greet for restless leg syndrome-she still treated for this with Requip. On allegra, nexium prn.   She is referred today for dyspnea.  She reports that she has had an exercise routine where she walked w her husband 2 miles, exercises at the [facility], for several years. She has noticed more SOB, especially with mild hills. Sometimes now has to stop briefly to complete the walk. Occasional chest tightness, no wheeze. No overt CP. She reports daily cough, often in the am, sometimes clear mucous. She is on allegra. She had a walking stress test 09/2014 > reassuring ECG w exercise. Weight has been stable. Her last TSH was 06/25/17, 0.73.   Review of her notes shows hx possible GGI on prior CT 2014.   ROV 02/26/18 [name] --79 year old never smoker with a history of mitral valve prolapse whom I saw earlier this year for exertional dyspnea and chronic cough.  Pulmonary function testing consistent with restriction.  She has GERD and allergic rhinitis both of which could be contributing to her chronic cough.  I performed a high-resolution CT scan of the chest on 1/20 and reviewed today, this shows some patchy confluent subpleural reticular disease and groundglass attenuation with some minimal traction bronchiectasis and no frank honeycomb change.  This reflects a progression compared with 2014 and is an NSIP pattern.  Walking oximetry at her last visit did not show any exertional desaturation. She is still coughing, is using fexofenadine, takes nexium qod.   [place], had [place] exposure.   ROV 08/26/2018 [name] --follow-up visit for 79 year old woman with a history of mitral valve prolapse, chronic cough, dyspnea with restrictive lung disease noted on pulmonary function testing.  High Res CT scan of the chest shows  some mild interstitial disease.  She was also having cough - better with flonase; still on nexium, allegra. Her exertional tolerance is improved, she has been exercising more. She remains on Requip for RLS.   We performed autoimmune labs last time, ANA positive at low titer (1: 80), SSA and SSB negative, RNP negative, RF negative, CCP negative, Anti-Smith negative, anti-SCL negative, DS DNA negative, anti-Jo negative, aldolase negative. She hasn't been seen in ILD clinic yet.    OV 10/08/2018  Subjective:  Patient ID: [name], female , DOB: 1940-07-26 , age 62 y.o. , MRN: [id number] , ADDRESS: [place] [address]   10/08/2018 -   Chief Complaint  Patient presents with  . Interstitial Lung Disease    Breathing the same as it was during August 2020 office visit with Dr. Lamonte Sakai     HPI Joylene Grapes Brasher 79 y.o. -has been referred to the interstitial lung disease clinic because of findings of interstitial lung disease.  History is gathered from talking to her, review of Dr. Collene Gobble notes and also the integrated ILD questionnaire.  Briefly, she tells me that she was working out at [employer] and would just notice occasional dyspnea but she really did not compare it with other people.  Then in October 2019 she started walking 3 miles daily except on the days it rains with a husband.  During this time she noticed that she was falling really behind because of shortness of breath and exertional fatigue.  This resulted in  subsequent evaluation all documented above.  Findings of interstitial lung disease with subpleural reticulation suggestive of an alternative diagnosis.  Autoimmune profile essentially negative other than trace positive ANA.  She tells me that her significant major problem is exertional dyspnea when walking stairs or walking several miles.  She did not desaturate in our office several months ago.  She does not know if she desaturates when she exerts walking 3  miles.  Pulmonary function test earlier this year is just isolated low DLCO.  She also has like a cough.  Overall since the onset of the symptoms by exercising herself more and conditioning she is somewhat better.  She did see Dr. Kirk Ruths in May 2020 and in June 2020 had a coronary calcium CT which appears to have no calcium deposits.    Integrated Comprehensive ILD Questionnaire  Symptoms:  -Dyspnea started suddenly and since it started it is better.  She says it is been present for years although she did tell me that it is only there since October 2019 when she noticed that.  Severity is listed below.  She does have a cough almost 1 year.  Since it started it is better.  It is mostly in the morning.  She does bring up some phlegm.  Early on in the morning it is green or yellow.  Since it started it is the same/better.  There is no wheezing.  She does have some chest tightness with this when she walks.  It is relieved by rest.  Cardiac work-up in June 2020 showed no calcium deposits.   Past Medical History : Positive for chronic longstanding acid reflux disease and thyroid disease..  In addition CT scan from January 2020 shows hiatal hernia that is small..  This presence of  sclerosis in the bony structures in January 2020.-Primary care physician has been sent a message today.  There is no asthma or COPD or heart failure rheumatoid arthritis or collagen vascular disease.  She does have GERD and hiatal hernia for several years to decades.  No sleep apnea.  No blood clots.  No hepatitis.  No tuberculosis.  No pleurisy.  , ANA positive at low titer (1: 80), SSA and SSB negative, RNP negative, RF negative, CCP negative, Anti-Smith negative, anti-SCL negative, DS DNA negative, anti-Jo negative, aldolase negative. She hasn't been seen in ILD clinic yet.    ROS:  -She does have fatigue for the last several years.  She does have some back and hip issues.  She does have dry eyes.  She does have  like some dysphagia.  There is presence of hiatal hernia-there is a small.  Acid reflux for several decades.  She also reports presence of nonspecific rash   FAMILY HISTORY of LUNG DISEASE: * -Her father died of mesothelioma in nineteen 83/1984 at the age of 80 otherwise no lung disease.   EXPOSURE HISTORY:   -When she was 17 she smokes cigarettes but otherwise no cigarette or tobacco use or electronic cigarette.  Never smoked marijuana.  No cocaine use no intravenous drug use.   HOME and HOBBY DETAILS :  -Single[place]family home [place] setting for the last 16 years in a 78 year old home.  No mold or mildew exposure in the [facility] duct or CPAP mask or humidifier.  No mold or mildew in the bathroom.  No pet birds in the house.  No misting [place].  No feather pillows no feather duvet.  No musical instruments.  She does some occasional gardening which she  likes.  She does do some fine-needle work.   OCCUPATIONAL HISTORY (122 questions) :  = Essentially negative except for the fact when she was a child she did some tobacco growing.  She has done home gardening for 50 years.    PULMONARY TOXICITY HISTORY (27 items):  denies  Results for KAYDEE, MAGEL (MRN [id number]) as of 11/07/2018 11:20  Ref. Range 02/26/2018 12:17  Anti Nuclear Antibody (ANA) Latest Ref Range: NEGATIVE  POSITIVE (A)  ANA Pattern 1 Unknown Nuclear, Speckled (A)  ANA Titer 1 Latest Units: titer 1:80 (H)  Anti JO-1 Latest Ref Range: 0.0 - 0.9 AI <7.4  Cyclic Citrullin Peptide Ab Latest Units: UNITS <16  ds DNA Ab Latest Units: IU/mL <1  RA Latex Turbid. Latest Ref Range: <14 IU/mL <14  ENA SM Ab Ser-aCnc Latest Ref Range: <1.0 NEG AI <1.0 NEG  Ribonucleic Protein(ENA) Antibody, IgG Latest Ref Range: <1.0 NEG AI <1.0 NEG  SSA (Ro) (ENA) Antibody, IgG Latest Ref Range: <1.0 NEG AI <1.0 NEG  SSB (La) (ENA) Antibody, IgG Latest Ref Range: <1.0 NEG AI <1.0 NEG  Scleroderma (Scl-70) (ENA) Antibody, IgG Latest Ref Range: <1.0 NEG AI  <1.0 NEG   Results for HARMONII, KARLE (MRN [id number]) as of 11/07/2018 11:20  Ref. Range 01/29/2018 14:31  FVC-Pre Latest Units: L 2.97  FVC-%Pred-Pre Latest Units: % 98  Results for AIRI, COPADO (MRN [id number]) as of 11/07/2018 11:20  Ref. Range 01/29/2018 14:31  DLCO unc Latest Units: ml/min/mmHg 14.80  DLCO unc % pred Latest Units: % 54    OV 11/07/2018  Subjective:  Patient ID: [name], female , DOB: [birth year] , age 10 y.o. , MRN: [id number] , ADDRESS: [place] [address]   11/07/2018 -   Chief Complaint  Patient presents with  . Follow-up    Patient reports that she has sob with any exertion.    Follow-up interstitial lung disease  HPI OLESYA WIKE 79 y.o. -last seen September 2020.  After that she was supposed to have follow-up high-resolution CT chest and spirometry DLCO.  For some reason the spirometry DLCO is not done.  In the interim her symptoms remain the same as shown by the symptom score below.  She does note when she does heavy exertion such as climbing stairs or long uphill walks her pulse ox drops to 84% but quickly regains.  She is not interested in portable oxygen.  She had a repeat high-resolution CT chest in September 2020 and when compared to January 2020 there is no significant change.  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  I personally visualized the film.  There is diffuse bilateral subpleural reticulation and some traction bronchiectasis.  I myself would say it may be this is indeterminate for UIP.  But there is some minimal air trapping and she is done some gardening work.  There is no upper zonal predominance that would fit in with hypersensitive pneumonitis.  She has incidental findings of bony sclerosis that is again repeated in the CT scan.  Her primary care physician Billey Gosling evaluated her.  I reviewed the note.  She is been referred to  Dr. Narda Rutherford in hematology who she is seeing November 12, 2018.  She is very confused about the fact that she has a bony sclerosis problem for which she is being referred to a blood doctor [hematologist] and she thought she is having  a blood test that was ordered by me.  I reviewed the chart and clarified this concepts   Ct Chest High Resolution  Result Date: 11/05/2018 CLINICAL DATA:  79 year old female with history of interstitial lung disease. Increased shortness of breath and cough over the past year. EXAM: CT CHEST WITHOUT CONTRAST TECHNIQUE: Multidetector CT imaging of the chest was performed following the standard protocol without intravenous contrast. High resolution imaging of the lungs, as well as inspiratory and expiratory imaging, was performed. COMPARISON:  High-resolution chest CT 02/10/2018. FINDINGS: Cardiovascular: Heart size is normal. There is no significant pericardial fluid, thickening or pericardial calcification. There is aortic atherosclerosis, as well as atherosclerosis of the great vessels of the mediastinum and the coronary arteries, including calcified atherosclerotic plaque in the left anterior descending and right coronary arteries. Mild calcifications of the mitral annulus. Mediastinum/Nodes: No pathologically enlarged mediastinal or hilar lymph nodes. Please note that accurate exclusion of hilar adenopathy is limited on noncontrast CT scans. Esophagus is unremarkable in appearance. No axillary lymphadenopathy. Lungs/Pleura: High-resolution images demonstrates some patchy areas of peripheral predominant septal thickening and subpleural reticulation, with mild cylindrical bronchiectasis and peripheral bronchiolectasis. No frank honeycombing confidently identified at this time. These findings have no definitive craniocaudal gradient. In the periphery of the mid to upper lungs there also some plaque-like areas of apparent pleuroparenchymal scarring and volume loss. Inspiratory and  expiratory imaging demonstrates minimal air trapping indicative of very mild small airways disease. Overall, these imaging findings appear stable compared to the prior study. Upper Abdomen: Aortic atherosclerosis. Musculoskeletal: Mild diffuse sclerosis throughout the visualized axial and appendicular skeleton, similar to prior examinations. There are no definite focal aggressive appearing lytic or blastic lesions noted in the visualized portions of the skeleton. IMPRESSION: 1. 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 2. Aortic atherosclerosis, in addition to 2 vessel coronary artery disease. Assessment for potential risk factor modification, dietary therapy or pharmacologic therapy may be warranted, if clinically indicated. 3. Persistent mild diffuse sclerosis throughout the visualized osseous structures without discrete aggressive appearing osseous lesions. Clinical correlation for signs and symptoms of potential infiltrative process such as myelofibrosis is suggested. Aortic Atherosclerosis (ICD10-I70.0). Electronically Signed   By: Vinnie Langton M.D.   On: 11/05/2018 14:36     OV 12/22/2018  Subjective:  Patient ID: [name], female , DOB: 09-17-1940 , age 34 y.o. , MRN: [id number] , ADDRESS: [place] [address]   12/22/2018 -   Chief Complaint  Patient presents with  . Follow-up    Pt states she has been doing good since last visit. Pt is still coughing and will get up clear mucus in the morning.   Follow-up interstitial lung disease with CT scan October 2020 being indeterminate versus alternate diagnosis.  History of gardening.  Trace autoimmune ANA  positive.  Mild progression since 2014  HPI TASMIN EXANTUS 79 y.o. -returns for follow-up.  She presents with her husband who I am meeting for the first time.  In the interim she met Dr. Roxan Hockey thoracic surgeon for surgical lung biopsy.  Her husband also met with him.  I reviewed the note.  She tells me that given the morbidity with surgical lung biopsy she wants to undergo  bronchoscopy with lavage and transbronchial biopsy first.  She understands the inherent limitations of these procedure in terms of diagnosis.  But she wants to take the lower risk profile.  Overall she feels stable.  Her symptom score is improved compared to the past.  Her walking desaturation test shows exaggerated drop in pulse ox but this was done with her wearing the mask.  She did not feel any dyspnea.  In terms of her bony sclerosis she has seen Dr. Lorenso Courier at [facility].  I reviewed the note.  He has reassured her.  Risks of pneumothorax, hemothorax, sedation/anesthesia complications such as cardiac or respiratory arrest or hypotension, stroke and bleeding all explained. Benefits of diagnosis but limitations of non-diagnosis also explained. Patient verbalized understanding and wished to proceed.   They want to have the bronchoscopy after the holidays of Christmas and new year.  This is because their house is undergoing remodeling currently.       SYMPTOM SCALE - ILD      O2 use 10/08/2018  11/07/2018  12/22/2018   Shortness of Breath 0 -> 5 scale with 5 being worst (score 6 If unable to do)  ra  At rest 0 0 0  Simple tasks - showers, clothes change, eating, shaving 1 1 0  Household (dishes, doing bed, laundry) _0 Shopping 2 2 0.5  Walking level at own pace 2 1 0.5  Walking keeping up with others of same age 58 2 0  Walking up Stairs _1 Walking up [place] 5 5 3.5  Total (40 - 48) Dyspnea Score 20 18 9.52.5  How bad is your cough? 1 3 2.5  How bad is your fatigue 2 2.5 1.5 am, 4.5 pm      OV  03/16/2019  Subjective:  Patient ID: [name], female , DOB: May 17, 1940 , age 7 y.o. , MRN: [id number] , ADDRESS: [place] [address]   03/16/2019 -   Chief Complaint  Patient presents with  . Follow-up    PFT performed today.  Pt states she has been doing okay since last visit and states her breathing is about the same.     Finally able to review esults of envisia send out test to [place]. Date of test is 02/05/2019. 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 -returns for follow-up to discuss bronchoscopy and lavage results.  In the interim no new respiratory issues.  They are all stable.  However she is having significant musculoskeletal issues with shoulder pain.  Serology from a year ago was negative.  They wanted to know about relatedness.  I told him it is probably not related.  We went over the bronchoscopy lavage results which showed some dominance of neutrophils consistent with UIP.  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   We had a long discussion about the benefits, risks and limitations of antifibrotic therapy.  We also discussed the choice between pirfenidone and nintedanib.    SYMPTOM SCALE - ILD 03/16/2019   O2 use ra  Shortness of Breath 0 -> 5 scale with 5 being worst (score 6 If unable to do)  At rest 0  Simple tasks - showers, clothes change, eating, shaving 1  Household (dishes, doing bed, laundry) 4  Shopping 3  Walking level at own pace 2  Walking up Stairs 5  Total (30-36) Dyspnea Score 15  How  bad is your cough? 0  How bad is your fatigue 0  How bad is nausea 0  How bad is vomiting?  0  How bad is diarrhea? 0  How bad is anxiety? 1.5  How bad is depression 0        Simple office walk 185 feet x  3 laps goal with forehead probe 10/08/2018  11/07/2018  12/22/2018  03/16/2019   O2 used ra ra ra - wor mask ra  Number laps completed _0 Comments about  pace good fast Mod pace Fast pace with mask  Resting Pulse Ox/HR 98% and 77/min 100% and 62/min 97% ad 70.min 99% and 59 min  Final Pulse Ox/HR 95% and 96/min 96% and 105/min 90%and 110/mi 96% and 119  Desaturated </= 88% no no no no  Desaturated <= 3% points yes Yes, 4 ppoints Yes, 7 ponits Yes, 3 points  Got Tachycardic >/= 90/min yes yes yes   Symptoms at end of test none none none Mild dysnea  Miscellaneous comments x  ? Mask related worsening     Results for JAYLA, MACKIE (MRN [id number]) as of 03/16/2019 11:12  Ref. Range 01/29/2018 14:31 03/16/2019 08:42  FVC-Pre Latest Units: L 2.97 2.91  FVC-%Pred-Pre Latest Units: % 98 97   Results for KARNE, OZGA (MRN [id number]) as of 03/16/2019 11:12  Ref. Range 01/29/2018 14:31 03/16/2019 08:42  DLCO unc Latest Units: ml/min/mmHg 14.80 12.32  DLCO unc % pred Latest Units: % 54 60   ROS - per HPI     has a past medical history of Allergic rhinitis, GERD (gastroesophageal reflux disease), Glaucoma, Hiatal hernia, Hyperlipidemia, Hypothyroidism, ILD (interstitial lung disease) ([place]), and MVP (mitral valve prolapse).   reports that she has never smoked. She has never used smokeless tobacco.  Past Surgical History:  Procedure Laterality Date  . APPENDECTOMY     with TAH  . CARDIAC CATHETERIZATION  1989   negative  . COLONOSCOPY  2005   Dr Olevia Perches  . COLONOSCOPY  01/2013   Dr Collene Mares  . esophageal dilation  07/04/2011   Dr Olevia Perches  . FLEXIBLE SIGMOIDOSCOPY  1999    Dr Olevia Perches  . KNEE ARTHROSCOPY  2001, 2005   Dr Percell Miller   bilat  . TOTAL ABDOMINAL HYSTERECTOMY     for Endometriosis; no BSO, Dr Colin Ina  . UPPER GI ENDOSCOPY  01/2013   Dr Collene Mares  . VIDEO BRONCHOSCOPY Bilateral 02/05/2019   Procedure: VIDEO BRONCHOSCOPY WITH FLUORO;  Surgeon: Brand Males, MD;  Location: [facility] ENDOSCOPY;  Service: Endoscopy;  Laterality: Bilateral;    Allergies  Allergen Reactions  . Caffeine     palpitations  . Chlorpheniramine-Pseudoeph      palpitations  . Maxifed     palpitations    Immunization History  Administered Date(s) Administered  . Influenza Split 11/07/2010  . Influenza Whole 10/30/2009, 11/27/2011  . Influenza, High Dose Seasonal PF 10/27/2012, 11/05/2013, 10/01/2017, 09/11/2018  . Influenza-Unspecified 11/24/2014, 10/07/2015, 10/08/2016  . PFIZER SARS-COV-2 Vaccination 03/14/2019  . Pneumococcal Conjugate-13 10/07/2015  . Pneumococcal Polysaccharide-23 07/02/2017  . Tdap 02/11/2012  . Zoster 01/09/2012  . Zoster Recombinat (Shingrix) 10/01/2017, 12/03/2017    Family History  Problem Relation Age of Onset  . Lung cancer Father        Asbestos with mesothelioma  . Heart disease Paternal Grandmother        ? etiology  . Diabetes Paternal Grandmother   . Diabetes Brother   . Uterine cancer  Paternal Aunt   . Stroke Paternal Grandfather        > 76  . Heart attack Brother 70  . Allergies Sister   . Aneurysm Mother        congenital  . Stroke Brother        two strokes  . Barrett's esophagus Brother      Current Outpatient Medications:  .  aspirin EC 81 MG tablet, Take 1 tablet (81 mg total) by mouth daily., Disp: 90 tablet, Rfl: 3 .  Biotin 5000 MCG TABS, Take 5,000 mcg by mouth daily., Disp: , Rfl:  .  Carboxymethylcellul-Glycerin (REFRESH RELIEVA OP), Place 1 drop into both eyes every 6 (six) hours as needed (dry eyes)., Disp: , Rfl:  .  cholecalciferol (VITAMIN D3) 25 MCG (1000 UT) tablet, Take 1,000 Units by mouth daily., Disp: , Rfl:  .  esomeprazole (NEXIUM) 20 MG capsule, Take 1 capsule (20 mg total) by mouth daily at 12 noon. (Patient taking differently: Take 20 mg by mouth daily as needed (heartburn). ), Disp: , Rfl:  .  fexofenadine (ALLEGRA) 180 MG tablet, Take 180 mg by mouth daily., Disp: , Rfl:  .  fluticasone (FLONASE) 50 MCG/ACT nasal spray, Place 1 spray into both nostrils daily as needed for allergies or rhinitis., Disp: , Rfl:  .  magnesium oxide (MAG-OX) 400 MG tablet, Take 400  mg by mouth daily., Disp: , Rfl:  .  Multiple Vitamins-Minerals (ZINC PO), Take 1 tablet by mouth., Disp: , Rfl:  .  pravastatin (PRAVACHOL) 40 MG tablet, TAKE 1 TABLET BY MOUTH  DAILY (Patient taking differently: Take 40 mg by mouth at bedtime. ), Disp: 90 tablet, Rfl: 2 .  Probiotic Product (PROBIOTIC DAILY PO), Take 1 capsule by mouth daily. ULTRA FLORA , Disp: , Rfl:  .  rOPINIRole (REQUIP) 0.5 MG tablet, TAKE 2 TABLETS BY MOUTH  AFTER DINNER EACH NIGHT. (Patient taking differently: Take 0.5-1 mg by mouth at bedtime. Dose depends on severity of restless legs), Disp: 180 tablet, Rfl: 2 .  SYNTHROID 75 MCG tablet, TAKE 1 TABLET BY MOUTH  DAILY BEFORE BREAKFAST (Patient taking differently: Take 75 mcg by mouth daily before breakfast. Uses Name Brand), Disp: 90 tablet, Rfl: 2 .  tiZANidine (ZANAFLEX) 2 MG tablet, TAKE 1 TABLET (2 MG TOTAL) BY MOUTH AT BEDTIME AS NEEDED FOR MUSCLE SPASMS. (Patient not taking: Reported on 01/29/2019), Disp: 30 tablet, Rfl: 1      Objective:   Vitals:   03/16/19 1039  BP: 124/70  Pulse: (!) 59  SpO2: 99%  Weight: 151 lb (68.5 kg)  Height: 5' 6" (1.676 m)    Estimated body mass index is 24.37 kg/m as calculated from the following:   Height as of this encounter: 5' 6" (1.676 m).   Weight as of this encounter: 151 lb (68.5 kg).  _0 @  Filed Weights   03/16/19 1039  Weight: 151 lb (68.5 kg)     Physical Exam Alert and oriented x3         Assessment:       ICD-10-CM   1. IPF (idiopathic pulmonary fibrosis) ([place])  J84.112   2. Interstitial lung disease ([facility])  J84.9 Hepatic function panel    Hepatic function panel    Hepatic function panel    Hepatic function panel    Pulmonary function test    Hepatic function panel  3. Therapeutic drug monitoring  Z51.81 Hepatic function panel    Hepatic function panel  Hepatic function panel    Hepatic function panel    Hepatic function panel       Plan:     Patient Instructions      ICD-10-CM   1. IPF (idiopathic pulmonary fibrosis) ([place])  J84.112   2. Interstitial lung disease ([place])  J84.9   3. Therapeutic drug monitoring  Z51.81    Your diagnosis idiopathic pulmonary fibrosis [IPF]  -Biopsy date is February 05, 2019  -Date of giving diagnosis is March 16, 2019  As discussed this is a progressive disease with unpredictable course  -There is already evidence of mild progression over time  Plan   -Start pirfenidone per protocol [we discussed extensively the option of the 2 drugs pirfenidone versus nintedanib and we took a shared decision making to go with pirfenidone]  -Refer to our pharmacist Amber Yopp to help initiate the process and to go over drug interactions  -Check liver function test today and every month for the next 6 months and then every 3 months after that  -You have to take this medicine with food and also 6 hours apart between 2 doses and apply sunscreen  -We will discuss other aspects of IPF care including clinical research as a care option, pulmonary rehabilitation, transplant, patient support group all in the future   Follow-up -4-6 weeks check liver function test -Schedule telephone visit a video visit with myself or nurse practitioner 15 minutes slot to ensure pirfenidone start and tolerance is going okay  -We will need to meet every 4 to 6 weeks for the next few to several months  -Check spirometry and DLCO in 3-4 months -You will need a 30-minute face-to-face visit in 3-4 months after spirometry    (Level 04: Estb 30-39 min  visit type: on-site physical face to visit visit spent in total care time and counseling or/and coordination of care by this undersigned MD - Dr Brand Males. This includes one or more of the following on this same day 03/16/2019: pre-charting, chart review, note writing, documentation discussion of test results, diagnostic or treatment recommendations, prognosis, risks and benefits of management options,  instructions, education, compliance or risk-factor reduction. It excludes time spent by the [place] or office staff in the care of the patient . Actual time is 31 min)     SIGNATURE    Dr. Brand Males, M.D., F.C.C.P,  Pulmonary and Critical Care Medicine Staff Physician, [place] Director - Interstitial Lung Disease  Program  Pulmonary [place] [place] [place], [place], [address]  Pager: [phone], If no answer or between  15:00h - 7:00h: call [phone] Telephone: [phone]  5:24 PM 03/16/2019

## 2019-03-16 NOTE — Progress Notes (Signed)
Spirometry/DLCO performed today.

## 2019-03-16 NOTE — Telephone Encounter (Signed)
Received Esbriet New start Paperwork. Will update as we work through the benefits process.  3:56 PM Beatriz Chancellor, CPhT

## 2019-03-16 NOTE — Patient Instructions (Addendum)
ICD-10-CM   1. IPF (idiopathic pulmonary fibrosis) ([place])  J84.112   2. Interstitial lung disease ([place])  J84.9   3. Therapeutic drug monitoring  Z51.81    Your diagnosis idiopathic pulmonary fibrosis [IPF]  -Biopsy date is February 05, 2019  -Date of giving diagnosis is March 16, 2019  As discussed this is a progressive disease with unpredictable course  -There is already evidence of mild progression over time  Plan   -Start pirfenidone per protocol [we discussed extensively the option of the 2 drugs pirfenidone versus nintedanib and we took a shared decision making to go with pirfenidone]  -Refer to our pharmacist Amber Yopp to help initiate the process and to go over drug interactions  -Check liver function test today and every month for the next 6 months and then every 3 months after that  -You have to take this medicine with food and also 6 hours apart between 2 doses and apply sunscreen  -We will discuss other aspects of IPF care including clinical research as a care option, pulmonary rehabilitation, transplant, patient support group all in the future   Follow-up -4-6 weeks check liver function test -Schedule telephone visit a video visit with myself or nurse practitioner 15 minutes slot to ensure pirfenidone start and tolerance is going okay  -We will need to meet every 4 to 6 weeks for the next few to several months  -Check spirometry and DLCO in 3-4 months -You will need a 30-minute face-to-face visit in 3-4 months after spirometry

## 2019-03-17 NOTE — Telephone Encounter (Signed)
Called patient to discuss Esbriet benefits investigation. Discussed insurance copay and patient assistance. Patient stated she would have to call me back to provide estimated household income. She has a household size of #2. Will follow up.  12:02 PM Elizabeth Espinoza, CPhT

## 2019-03-17 NOTE — Telephone Encounter (Signed)
Patient called back with annual income and household size #2/ [phone]. Patient's income is above the open 2 PF grant foundations. Will move forward with manufacturer assistance for patient.  12:09 PM Beatriz Chancellor, CPhT

## 2019-03-18 NOTE — Telephone Encounter (Signed)
Faxed patient's forms to [employer].   Fax# [phone] Phone# [phone]

## 2019-03-20 NOTE — Telephone Encounter (Signed)
Called [facility] [employer] to check on patient's referral. Documents have been received and are currently pending. Will follow up on Monday.  Phone# [phone]

## 2019-03-21 LAB — ACID FAST CULTURE WITH REFLEXED SENSITIVITIES (MYCOBACTERIA): Acid Fast Culture: NEGATIVE

## 2019-03-23 ENCOUNTER — Other Ambulatory Visit: Payer: Self-pay | Admitting: Orthopedic Surgery

## 2019-03-23 DIAGNOSIS — M25512 Pain in left shoulder: Secondary | ICD-10-CM | POA: Diagnosis not present

## 2019-03-23 DIAGNOSIS — M25511 Pain in right shoulder: Secondary | ICD-10-CM

## 2019-03-24 NOTE — Telephone Encounter (Signed)
Patient returned call. Provided [employer] info, patient will call me back to advise the outcome.

## 2019-03-24 NOTE — Telephone Encounter (Signed)
Called [employer], rep advised that patient was enrolled into [employer] and triaged to pharmacy. When I searched patient on [place] website, patient's status is listed as Restricted- with payments on hold. Advised [employer] rep, she advised that if patient's grant is denied, she can be enrolled into [employer]'s Patient Assistance. They would just need [facility] Provider form completed.  Called [employer], rep Loma Sousa advised that they have patient on hold due to needing more information. Patient just needs to call [phone], option # 7. ID# [id number]  Called patient, left message to call back.  10:09 AM Beatriz Chancellor, CPhT

## 2019-03-27 DIAGNOSIS — M25511 Pain in right shoulder: Secondary | ICD-10-CM | POA: Diagnosis not present

## 2019-03-30 DIAGNOSIS — M25512 Pain in left shoulder: Secondary | ICD-10-CM | POA: Diagnosis not present

## 2019-03-30 DIAGNOSIS — M25511 Pain in right shoulder: Secondary | ICD-10-CM | POA: Diagnosis not present

## 2019-03-31 NOTE — Telephone Encounter (Signed)
Patient's [employer] grant is on hold, pending income document review.

## 2019-04-01 NOTE — Telephone Encounter (Signed)
Called patient and she advised she sent in her income documents last week. [employer] rep advised that it could take up to 7-14 business days to process once received.  10:09 AM Beatriz Chancellor, CPhT

## 2019-04-06 ENCOUNTER — Ambulatory Visit: Payer: Medicare Other

## 2019-04-06 ENCOUNTER — Ambulatory Visit: Payer: Medicare Other | Attending: Internal Medicine

## 2019-04-06 DIAGNOSIS — Z23 Encounter for immunization: Secondary | ICD-10-CM

## 2019-04-06 NOTE — Telephone Encounter (Signed)
Patient was denied [employer]. Will need to complete [facility] provider form for Esbriet PAP.

## 2019-04-06 NOTE — Progress Notes (Signed)
   Covid-19 Vaccination Clinic  Name:  EMONI WHITWORTH    MRN: [id number] DOB: Mar 05, 1940  04/06/2019  Ms. Bracknell was observed post Covid-19 immunization for 15 minutes without incident. She was provided with Vaccine Information Sheet and instruction to access the V-Safe system.   Ms. Joaquin was instructed to call 911 with any severe reactions post vaccine: Marland Kitchen Difficulty breathing  . Swelling of face and throat  . A fast heartbeat  . A bad rash all over body  . Dizziness and weakness   Immunizations Administered    Name Date Dose VIS Date Route   Pfizer COVID-19 Vaccine 04/06/2019  9:01 AM 0.3 mL 01/02/2019 Intramuscular   Manufacturer: [place]   Lot: [id number]   [place]: [id number]

## 2019-04-07 NOTE — Telephone Encounter (Signed)
Completed Prescriber portion for Esbriet PAP, placed in prescriber box with note to return to pharmacy team.  9:07 AM Beatriz Chancellor, CPhT

## 2019-04-09 ENCOUNTER — Ambulatory Visit (INDEPENDENT_AMBULATORY_CARE_PROVIDER_SITE_OTHER): Payer: Medicare Other | Admitting: Pharmacist

## 2019-04-09 ENCOUNTER — Other Ambulatory Visit: Payer: Self-pay

## 2019-04-09 DIAGNOSIS — Z7189 Other specified counseling: Secondary | ICD-10-CM

## 2019-04-09 DIAGNOSIS — J849 Interstitial pulmonary disease, unspecified: Secondary | ICD-10-CM

## 2019-04-09 MED ORDER — ESBRIET 267 MG PO TABS: ORAL_TABLET | ORAL | 0 refills | Status: DC

## 2019-04-09 NOTE — Telephone Encounter (Signed)
Called [employer] to check status of PAP application. Spoke to rep and they have received all required documents. Next step is for them to speak with patient to complete enrollment. Called patient, left message with [place] phone number. Office will receive fax of determination.  Phone# [phone]

## 2019-04-09 NOTE — Progress Notes (Signed)
HPI  Patient presents today to [facility] for Initial appt with pharmacy team for [place] counseling. Pertinent past medical history includes ILD, allergic rhinitis, GERD, Barrett's esophagus, hypothyroidism, RLS, hyperlipidemia, and prediabetes.  She is naive to anti-fibrotic therapy.  Esbriet was approved through insurance and she was also approved for patient assistance.  She is in the process of scheduling her for shipment.  OBJECTIVE Allergies  Allergen Reactions  . Caffeine     palpitations  . Chlorpheniramine-Pseudoeph     palpitations  . Maxifed     palpitations    Outpatient Encounter Medications as of 04/09/2019  Medication Sig  . aspirin EC 81 MG tablet Take 1 tablet (81 mg total) by mouth daily.  . Biotin 5000 MCG TABS Take 5,000 mcg by mouth daily.  . Carboxymethylcellul-Glycerin (REFRESH RELIEVA OP) Place 1 drop into both eyes every 6 (six) hours as needed (dry eyes).  . cholecalciferol (VITAMIN D3) 25 MCG (1000 UT) tablet Take 1,000 Units by mouth daily.  Marland Kitchen esomeprazole (NEXIUM) 20 MG capsule Take 1 capsule (20 mg total) by mouth daily at 12 noon. (Patient taking differently: Take 20 mg by mouth daily as needed (heartburn). )  . fexofenadine (ALLEGRA) 180 MG tablet Take 180 mg by mouth daily.  . fluticasone (FLONASE) 50 MCG/ACT nasal spray Place 1 spray into both nostrils daily as needed for allergies or rhinitis.  . magnesium oxide (MAG-OX) 400 MG tablet Take 400 mg by mouth daily.  . Multiple Vitamins-Minerals (ZINC PO) Take 1 tablet by mouth.  . pravastatin (PRAVACHOL) 40 MG tablet TAKE 1 TABLET BY MOUTH  DAILY (Patient taking differently: Take 40 mg by mouth at bedtime. )  . Probiotic Product (PROBIOTIC DAILY PO) Take 1 capsule by mouth daily. ULTRA FLORA   . rOPINIRole (REQUIP) 0.5 MG tablet TAKE 2 TABLETS BY MOUTH  AFTER DINNER EACH NIGHT. (Patient taking differently: Take 0.5-1 mg by mouth at bedtime. Dose depends on severity of restless legs)  . SYNTHROID  75 MCG tablet TAKE 1 TABLET BY MOUTH  DAILY BEFORE BREAKFAST (Patient taking differently: Take 75 mcg by mouth daily before breakfast. Uses Name Brand)  . tiZANidine (ZANAFLEX) 2 MG tablet TAKE 1 TABLET (2 MG TOTAL) BY MOUTH AT BEDTIME AS NEEDED FOR MUSCLE SPASMS. (Patient not taking: Reported on 01/29/2019)  . [DISCONTINUED] loratadine (CLARITIN) 10 MG tablet Take 10 mg by mouth daily as needed.     No facility-administered encounter medications on file as of 04/09/2019.     Immunization History  Administered Date(s) Administered  . Influenza Split 11/07/2010  . Influenza Whole 10/30/2009, 11/27/2011  . Influenza, High Dose Seasonal PF 10/27/2012, 11/05/2013, 10/01/2017, 09/11/2018  . Influenza-Unspecified 11/24/2014, 10/07/2015, 10/08/2016  . PFIZER SARS-COV-2 Vaccination 03/14/2019, 04/06/2019  . Pneumococcal Conjugate-13 10/07/2015  . Pneumococcal Polysaccharide-23 07/02/2017  . Tdap 02/11/2012  . Zoster 01/09/2012  . Zoster Recombinat (Shingrix) 10/01/2017, 12/03/2017     HRCT 11/05/2018-most compatible with an alternative diagnosis to usual interstitial pneumonia (UIP) per current ATS guidelines. 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  PFT's No results found for: FEV1, FVC, FEV1FVC, TLC, DLCO   CMP     Component Value Date/Time   NA 141 11/12/2018 1118   K 4.2 11/12/2018 1118   CL 106 11/12/2018 1118   CO2 24 11/12/2018 1118   GLUCOSE 92 11/12/2018 1118   BUN 15 11/12/2018 1118   CREATININE 0.78 11/12/2018  1118   CREATININE 0.80 11/07/2010 1027   CALCIUM 9.3 11/12/2018 1118   CALCIUM 9.6 11/12/2018 1118   PROT 7.1 03/16/2019 1206   ALBUMIN 4.4 03/16/2019 1206   AST 16 03/16/2019 1206   AST 20 11/12/2018 1118   ALT 12 03/16/2019 1206   ALT 14 11/12/2018 1118   ALKPHOS 96 03/16/2019 1206   BILITOT 0.6 03/16/2019 1206   BILITOT 0.8 11/12/2018  1118   GFRNONAA >60 11/12/2018 1118   GFRAA >60 11/12/2018 1118     CBC    Component Value Date/Time   WBC 10.4 01/29/2019 1042   RBC 4.13 01/29/2019 1042   HGB 12.0 01/29/2019 1042   HGB 13.5 11/12/2018 1118   HCT 36.6 01/29/2019 1042   PLT 257.0 01/29/2019 1042   PLT 241 11/12/2018 1118   MCV 88.7 01/29/2019 1042   MCH 29.5 11/12/2018 1118   MCHC 32.9 01/29/2019 1042   RDW 14.6 01/29/2019 1042   LYMPHSABS 2.9 01/29/2019 1042   MONOABS 0.8 01/29/2019 1042   EOSABS 0.3 01/29/2019 1042   BASOSABS 0.1 01/29/2019 1042     LFT's Hepatic Function Latest Ref Rng & Units 03/16/2019 11/12/2018 07/04/2018  Total Protein 6.0 - 8.3 g/dL 7.1 7.6 7.0  Albumin 3.5 - 5.2 g/dL 4.4 4.5 4.4  AST 0 - 37 U/L _0 ALT 0 - 35 U/L _1 Alk Phosphatase 39 - 117 U/L 96 80 75  Total Bilirubin 0.2 - 1.2 mg/dL 0.6 0.8 0.9  Bilirubin, Direct 0.0 - 0.3 mg/dL 0.1 - -     ASSESSMENT  1. Esbriet Medication Management  Patient counseled on purpose, proper use, and potential adverse effects including nausea, vomiting, abdominal pain, GERD, weight loss, arthralgia, dizziness, and suns sensitivity/rash.  Stressed the importance of routine lab monitoring. Will monitor LFT's every month for the first 6 months of treatment then every 3 months. Will monitor CBC every 3 months.  Starting dose will be Esbriet 267 mg 1 tablet three times daily for 7 days, then 2 tablets three times daily for 7 days, then 3 tablets three times daily.  Maintenance dose will be 801 mg 1 tablet three times daily if tolerated.  Stressed the importance of taking with meals to minimize stomach upset.    2. Medication Reconciliation  A drug regimen assessment was performed, including review of allergies, interactions, disease-state management, dosing and immunization history. Medications were reviewed with the patient, including name, instructions, indication, goals of therapy, potential side effects, importance of adherence, and  safe use.  Drug interaction(s): no major drug interactions idenfitied  3. Immunizations  Patient is up-to-date with annual flu, pneumonia, Shingrix and COVID-19 vaccine.  PLAN  Start Esbriet when you receive your first shipment.    Appointment with Tammy Parett 04/20/2019.  Appointment with pharmacy on 05/14/2019 for 1 month follow-up and labs.  Patient instructed to call the office if she is experiencing side effects to discuss management options.  All questions encouraged and answered.  Instructed patient to call with any further questions or concerns.  Thank you for allowing pharmacy to participate in this patient's care.  This appointment required  30 minutes of patient care (this includes precharting, chart review, review of results, face-to-face care, etc.).   Mariella Saa, PharmD, [place], [place] Clinical Specialty Pharmacist [phone]  04/09/2019 3:09 PM

## 2019-04-09 NOTE — Telephone Encounter (Signed)
Patient returned called and stated she called [place] and she was told she was Approved for 3 years. They advised that [employer] will call her in the next couple business days to schedule 1st shipment of Esbriet. Transferred patient to [employer] to counsel on medication.  12:52 PM Beatriz Chancellor, CPhT

## 2019-04-10 NOTE — Telephone Encounter (Signed)
Received Fax confirmation from [place] of patient PAP approval for Esbriet. Patient is approved until further notice. Patient will have medication shipped directly from [place], phone [phone].  1:55 PM Beatriz Chancellor, CPhT

## 2019-04-14 ENCOUNTER — Other Ambulatory Visit: Payer: Medicare Other

## 2019-04-14 DIAGNOSIS — R21 Rash and other nonspecific skin eruption: Secondary | ICD-10-CM | POA: Diagnosis not present

## 2019-04-14 DIAGNOSIS — L661 Lichen planopilaris: Secondary | ICD-10-CM | POA: Diagnosis not present

## 2019-04-14 DIAGNOSIS — L509 Urticaria, unspecified: Secondary | ICD-10-CM | POA: Diagnosis not present

## 2019-04-15 NOTE — Telephone Encounter (Signed)
Spoke to patient, she has not scheduled 1st shipment of Esbriet, nor has pharmacy contacted her.  But patient states that she has broken out into hives all over her body and her PCP told her that she should not start a new drug until her hives have gone away. Patient's PCP is supposed to call in pt a prednisone taper and a topical cream for her hives. Patient is not sure of the cause of the hives, but states this has happened in the past. She states she had allergy tests previously that showed no positive results of any allergy.   Advised patient I would notify the clinical pharmacist to advise her on how long she should wait to start [place].  1:57 PM Beatriz Chancellor, CPhT

## 2019-04-17 NOTE — Telephone Encounter (Signed)
Follow-up with patient.  Patient states that she was referred to a dermatologist.  Because of symptoms still unknown but dermatologist did the biopsy and results are still pending.  Her dermatologist recommended to hold starting Esbriet until her symptoms have resolved.  I agree with plan.  We will follow-up with patient next week in regards to starting Esbriet.  Patient was able to set up her for shipment of [place] and it should arrive Thursday.  Advised patient to not cancel her shipment and wait to start Esbriet until her symptoms have resolved.  Patient verbalized understanding.   Mariella Saa, PharmD, [place], [place] Clinical Specialty Pharmacist [phone]  04/17/2019 9:14 AM

## 2019-04-17 NOTE — Telephone Encounter (Signed)
Thanks  1. Support waiting till skin issue identified 2. Main issue with esbriet and skin is photosensitivity - so if she goes back on it - then needs to apply sunscreen which she should anyways 3. Please keep on top of this. I am on PALL through 05/03/2019

## 2019-04-20 ENCOUNTER — Other Ambulatory Visit: Payer: Self-pay

## 2019-04-20 ENCOUNTER — Ambulatory Visit (INDEPENDENT_AMBULATORY_CARE_PROVIDER_SITE_OTHER): Payer: Medicare Other | Admitting: Primary Care

## 2019-04-20 ENCOUNTER — Ambulatory Visit: Payer: Medicare Other | Admitting: Adult Health

## 2019-04-20 ENCOUNTER — Encounter: Payer: Self-pay | Admitting: Primary Care

## 2019-04-20 DIAGNOSIS — L509 Urticaria, unspecified: Secondary | ICD-10-CM | POA: Diagnosis not present

## 2019-04-20 NOTE — Addendum Note (Signed)
Addended by: Tery Sanfilippo R on: 04/20/2019 01:14 PM   Modules accepted: Orders

## 2019-04-20 NOTE — Progress Notes (Signed)
Virtual Visit via Telephone Note  I connected with Elizabeth Espinoza on 04/20/19 at 11:00 AM EDT by telephone and verified that I am speaking with the correct person using two identifiers.  Location: Patient: Home Provider: Office   I discussed the limitations, risks, security and privacy concerns of performing an evaluation and management service by telephone and the availability of in person appointments. I also discussed with the patient that there may be a patient responsible charge related to this service. The patient expressed understanding and agreed to proceed.   History of Present Illness: 79 year old female, smoked at age 66  PMH significant for ILD, chronic cough, dyspnea with restrictive lung disease, mitral valve prolapse, RLS . Patient of Dr. Chase Caller, last seen 03/16/19. Serology negative. 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 Therefore diagnosis is IPF. Recommended to start on pirfenidone. Needs spirometry with DLCO in 3-4 months.    04/20/2019 Patient contacted today to discuss starting newly prescribed Esbriet medication. Patient developed a rash on March 20th prior to taking anti-fibrotic. It started out under her arms and progressed to her neck, across her shoulders, waist line, groin and down her legs. She was seen by [facility], bx showed urticaria. Given prescription for oral prendisone and steriod cream. She still has some areas of hives on her chest and waist/sides. Today's is the last day of oral prednisone. Dermatology recommended she see an Allergy specialist.   Observations/Objective:  - Appears well - Able to speak in full sentences; no wheezing, cough or shortness of breath  Assessment and Plan:  Urticaria: - Developed rash prior to starting anti-fibrotic  - Finished oral prednisone course today and continues steriod cream  - Following with [facility] dermatology,  her next apt is April 14th.  - Refer to Allergy   IPF: - Hold off on starting Esbriet until either seen by allergy or hives resolve  Follow Up Instructions:   -2 weeks with NP  I discussed the assessment and treatment plan with the patient. The patient was provided an opportunity to ask questions and all were answered. The patient agreed with the plan and demonstrated an understanding of the instructions.   The patient was advised to call back or seek an in-person evaluation if the symptoms worsen or if the condition fails to improve as anticipated.  I provided 18 minutes of non-face-to-face time during this encounter.   Martyn Ehrich, NP

## 2019-04-20 NOTE — Patient Instructions (Signed)
Referral: - Allergy re: urticaria

## 2019-04-21 NOTE — Telephone Encounter (Signed)
Called to follow up with patient.  Esbriet is still on hold. She was seen by Derl Barrow, NP yesterday.  She has an appointment with an allergist next week.  She has a follow up with Beth on 4/13 to discuss starting Esbriet.  Patient had a follow up appointment and labs with pharmacy on 4/22.  Canceled appointment as no longer needed.  Will reschedule if/when patient starts Esbriet.  Mariella Saa, PharmD, [place], [place] Clinical Specialty Pharmacist [phone]  04/21/2019 4:00 PM

## 2019-04-22 DIAGNOSIS — L509 Urticaria, unspecified: Secondary | ICD-10-CM | POA: Diagnosis not present

## 2019-04-23 ENCOUNTER — Other Ambulatory Visit: Payer: Self-pay | Admitting: Pharmacist

## 2019-04-23 DIAGNOSIS — J849 Interstitial pulmonary disease, unspecified: Secondary | ICD-10-CM

## 2019-04-23 MED ORDER — ESBRIET 267 MG PO TABS: ORAL_TABLET | ORAL | 0 refills | Status: AC

## 2019-04-23 NOTE — Progress Notes (Signed)
Rx had incorrect provider. Updated to correct provider.

## 2019-04-30 ENCOUNTER — Other Ambulatory Visit: Payer: Self-pay

## 2019-04-30 ENCOUNTER — Encounter: Payer: Self-pay | Admitting: Allergy and Immunology

## 2019-04-30 ENCOUNTER — Ambulatory Visit: Payer: Medicare Other | Admitting: Allergy and Immunology

## 2019-04-30 VITALS — BP 138/78 | HR 64 | Temp 98.4°F | Resp 16 | Ht 64.5 in | Wt 149.8 lb

## 2019-04-30 DIAGNOSIS — L501 Idiopathic urticaria: Secondary | ICD-10-CM | POA: Diagnosis not present

## 2019-04-30 NOTE — Patient Instructions (Signed)
  1.  Cetirizine 10 mg -1 tablet twice a day for 2 weeks.  After 2 weeks lower dose of cetirizine to 1 time per day for an additional 2 weeks and then may discontinue if doing well  2.  Contact clinic should skin condition not resolve or worsen in the future

## 2019-04-30 NOTE — Progress Notes (Signed)
[place] [facility] [place] - [place] - [place] [facility] [place]   Dear Dr. Chase Caller,  Thank you for referring Elizabeth Espinoza to the [facility] on 04/30/2019.   Below is a summation of this patient's evaluation and recommendations.  Thank you for your referral. I will keep you informed about this patient's response to treatment.   If you have any questions please do not hesitate to contact me.   Sincerely,  Jiles Prows, MD Allergy / Immunology [place]   ______________________________________________________________________    NEW PATIENT NOTE  Referring Provider: Brand Males, MD Primary Provider: Binnie Rail, MD Date of office visit: 04/30/2019    Subjective:   Chief Complaint:  Elizabeth Espinoza (DOB: 1940/07/01) is a 79 y.o. female who presents to the clinic on 04/30/2019 with a chief complaint of Urticaria .     HPI: Elizabeth Espinoza presents to this clinic in evaluation of dermatitis.  Apparently 3 years ago she developed a global episode of urticaria and for the most part that issue resolved.  There was not really a identifiable factor responsible for that event.    She did quite well until she broke out with another episode of global redness with some occasional red raised itchy lesions that were intensely itchy on 11 April 2019.  She was seen by her dermatologist where a biopsy identified "hives" and she was given systemic steroids and topical steroids only to continue to have a significant reaction with more redness and more itchiness for which she subsequently saw her dermatologist once again and was treated with hydroxyzine and Zyrtec and Sarna.  At this point she is much better although she still has some occasional itchiness and some redness.  She stopped her hydroxyzine and Zyrtec 3 days ago and she still continues to improve.  At no point in time did she have any associated  systemic or constitutional symptoms with this issue.  Regarding possible triggers for this immune activation, she started hydroxychloroquine about 1 month prior to this reaction for a scalp disorder prescribed by her dermatologist and 5 days before her outbreak she received her second [place] vaccination.  She does not really have an atopic history to any significant degree.  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 Chase Caller, pulmonary, and will be starting pirfenidone therapy for this condition sometime in the near future.  She does use Flonase for some nasal issues.  Past Medical History:  Diagnosis Date  . Allergic rhinitis   . GERD (gastroesophageal reflux disease)   . Glaucoma    SUSPECT  . Hiatal hernia   . Hyperlipidemia    LDL goal = < 100  . Hypothyroidism   . ILD (interstitial lung disease) ([place])   . Lichen planopilaris   . MVP (mitral valve prolapse)    documented on 2 D ECHO  . Restless legs   . Urticaria     Past Surgical History:  Procedure Laterality Date  . APPENDECTOMY     with TAH  . CARDIAC CATHETERIZATION  1989   negative  . COLONOSCOPY  2005   Dr Olevia Perches  . COLONOSCOPY  01/2013   Dr Collene Mares  . esophageal dilation  07/04/2011   Dr Olevia Perches  . FLEXIBLE SIGMOIDOSCOPY  1999    Dr Olevia Perches  . KNEE ARTHROSCOPY  2001, 2005   Dr Percell Miller   bilat  . TOTAL ABDOMINAL HYSTERECTOMY  for Endometriosis; no BSO, Dr Colin Ina  . UPPER GI ENDOSCOPY  01/2013   Dr Collene Mares  . VIDEO BRONCHOSCOPY Bilateral 02/05/2019   Procedure: VIDEO BRONCHOSCOPY WITH FLUORO;  Surgeon: Brand Males, MD;  Location: [facility] ENDOSCOPY;  Service: Endoscopy;  Laterality: Bilateral;    Allergies as of 04/30/2019      Reactions   Caffeine    palpitations   Chlorpheniramine-pseudoeph    palpitations   Maxifed    palpitations      Medication List    aspirin EC 81 MG tablet Take 1 tablet (81 mg total) by mouth daily.   Biotin 5000 MCG Tabs Take 5,000 mcg by mouth  daily.   cetirizine 10 MG tablet Commonly known as: ZYRTEC Take 10 mg by mouth 2 (two) times daily.   cholecalciferol 25 MCG (1000 UNIT) tablet Commonly known as: VITAMIN D3 Take 1,000 Units by mouth daily.   clobetasol 0.05 % external solution Commonly known as: TEMOVATE SMARTSIG:1 Milliliter(s) Topical Twice Daily   diphenhydrAMINE 25 MG tablet Commonly known as: BENADRYL as needed.   Esbriet 267 MG Tabs Generic drug: Pirfenidone Take 1 tablet (267 mg total) by mouth 3 (three) times daily for 7 days, THEN 2 tablets (534 mg total) 3 (three) times daily for 7 days, THEN 3 tablets (801 mg total) 3 (three) times daily for 16 days. Start taking on: April 23, 2019   esomeprazole 20 MG capsule Commonly known as: NexIUM Take 1 capsule (20 mg total) by mouth daily at 12 noon.   fexofenadine 180 MG tablet Commonly known as: ALLEGRA Take 180 mg by mouth daily.   fluticasone 50 MCG/ACT nasal spray Commonly known as: FLONASE Place 1 spray into both nostrils daily as needed for allergies or rhinitis.   hydroxychloroquine 200 MG tablet Commonly known as: PLAQUENIL Take 200 mg by mouth 2 (two) times daily.   hydrOXYzine 25 MG tablet Commonly known as: ATARAX/VISTARIL Take 25 mg by mouth 3 (three) times daily.   magnesium oxide 400 MG tablet Commonly known as: MAG-OX Take 400 mg by mouth daily.   pravastatin 40 MG tablet Commonly known as: PRAVACHOL TAKE 1 TABLET BY MOUTH  DAILY What changed: when to take this   predniSONE 10 MG tablet Commonly known as: DELTASONE Take 10 mg by mouth daily with breakfast. Finish on 04/20/2019   REFRESH RELIEVA OP Place 1 drop into both eyes every 6 (six) hours as needed (dry eyes).   rOPINIRole 0.5 MG tablet Commonly known as: REQUIP TAKE 2 TABLETS BY MOUTH  AFTER DINNER EACH NIGHT. What changed: See the new instructions.   Sarna lotion Generic drug: camphor-menthol Apply 1 application topically as needed for itching.   Synthroid 75  MCG tablet Generic drug: levothyroxine TAKE 1 TABLET BY MOUTH  DAILY BEFORE BREAKFAST What changed: See the new instructions.   traMADol 50 MG tablet Commonly known as: ULTRAM Take 50 mg by mouth every 6 (six) hours as needed.   triamcinolone cream 0.1 % Commonly known as: KENALOG APPLY TO AFFECTED AREA TWICE DAILY AS NEEDED FOR ITCHING   ZINC PO Take 1 tablet by mouth.       Review of systems negative except as noted in HPI / PMHx or noted below:  Review of Systems  Constitutional: Negative.   HENT: Negative.   Eyes: Negative.   Respiratory: Negative.   Cardiovascular: Negative.   Gastrointestinal: Negative.   Genitourinary: Negative.   Musculoskeletal: Negative.   Skin: Negative.   Neurological: Negative.   Endo/Heme/Allergies:  Negative.   Psychiatric/Behavioral: Negative.     Family History  Problem Relation Age of Onset  . Lung cancer Father        Asbestos with mesothelioma  . Heart disease Paternal Grandmother        ? etiology  . Diabetes Paternal Grandmother   . Diabetes Brother   . Uterine cancer Paternal Aunt   . Stroke Paternal Grandfather        > 12  . Heart attack Brother 39  . Allergies Sister   . Aneurysm Mother        congenital  . Stroke Brother        two strokes  . Barrett's esophagus Brother     Social History   Socioeconomic History  . Marital status: Married    Spouse name: Jeneen Rinks  . Number of children: 2  . Years of education: Not on file  . Highest education level: Not on file  Occupational History  . Occupation: retired from [employer] retired  Tobacco Use  . Smoking status: Never Smoker  . Smokeless tobacco: Never Used  Substance and Sexual Activity  . Alcohol use: Yes    Comment:  rarely  . Drug use: No  . Sexual activity: Yes  Other Topics Concern  . Not on file  Social History Narrative  . Not on file    Environmental and Social history  Lives in a house with a dry environment, no animals located inside  the household, carpet in the bedroom, plastic on the bed, plastic on the pillow, no smoking ongoing with inside the household.  Objective:   Vitals:   04/30/19 1421  BP: 138/78  Pulse: 64  Resp: 16  Temp: 98.4 F (36.9 C)  SpO2: 94%   Height: 5' 4.5" (163.8 cm) Weight: 149 lb 12.8 oz (67.9 kg)  Physical Exam Constitutional:      Appearance: She is not diaphoretic.  HENT:     Head: Normocephalic.     Right Ear: Tympanic membrane, ear canal and external ear normal.     Left Ear: Tympanic membrane, ear canal and external ear normal.     Nose: Nose normal. No mucosal edema or rhinorrhea.     Mouth/Throat:     Pharynx: Uvula midline. No oropharyngeal exudate.  Eyes:     Conjunctiva/sclera: Conjunctivae normal.  Neck:     Thyroid: No thyromegaly.     Trachea: Trachea normal. No tracheal tenderness or tracheal deviation.  Cardiovascular:     Rate and Rhythm: Normal rate and regular rhythm.     Heart sounds: Normal heart sounds, S1 normal and S2 normal. No murmur.  Pulmonary:     Effort: No respiratory distress.     Breath sounds: Normal breath sounds. No stridor. No wheezing (Widespread inspiratory crackles all lung fields) or rales.  Lymphadenopathy:     Head:     Right side of head: No tonsillar adenopathy.     Left side of head: No tonsillar adenopathy.     Cervical: No cervical adenopathy.  Skin:    Findings: No erythema or rash (Very faint blanching erythematous blotches trunk).     Nails: There is no clubbing.  Neurological:     Mental Status: She is alert.     Diagnostics: Allergy skin tests were not performed.   Results of blood tests obtained 03/16/2019 identified AST 16 U/L, ALT 12 U/L  Assessment and Plan:    1. Idiopathic urticaria     1.  Cetirizine 10 mg -1 tablet twice a day for 2 weeks.  After 2 weeks lower dose of cetirizine to 1 time per day for an additional 2 weeks and then may discontinue if doing well  2.  Contact clinic should skin  condition not resolve or worsen in the future  Elizabeth Espinoza had some form of immunological activity that appears to be improving at this point in time and in fact she is about 95% improved without any specific therapy. Recommend that she use some cetirizine for a few more weeks and then she can taper off this medication if she continues to do well. I am going to hold off on any further evaluation for the trigger of this immunological hyperreactivity and will assume that this was secondary to her second Pfizer coronavirus vaccine as this activation appeared to occur 5 days after receiving this vaccine. She has started hydroxychloroquine within a month of developing this immune activation but she is improving even though she remains on hydroxychloroquine and and thus I do not think that hydroxychloroquine is the trigger for this issue at this point. She will keep in contact with me and should she develop problems in the future or not resolve this issue completely within the next several weeks we can consider further evaluation and treatment.  Jiles Prows, MD Allergy / Immunology [facility]

## 2019-05-04 ENCOUNTER — Telehealth: Payer: Self-pay | Admitting: Pharmacist

## 2019-05-04 NOTE — Telephone Encounter (Signed)
Received voicemail from patient inquiring if her appointment tomorrow was in person or virtual.  She also had her appointment with allergist and wanted to see if he had sent a message to the clinic.  Advised patient that her appointment tomorrow at 73 is with Elizabeth Espinoza and is a televisit.  Advised that I am unsure if the message was sent to Dr. Chase Caller but our office can see the clinic note.  Patient verbalized understanding.  Patient states they believe her reaction was due to the COVID-19 vaccine.  She states she has had a 95% improvement in her rash.    All questions encouraged and answered.  Instructed patient to reach out with any further questions or concerns after her appointment tomorrow.   Mariella Saa, PharmD, [place], [place] Clinical Specialty Pharmacist [phone]  05/04/2019 12:25 PM

## 2019-05-05 ENCOUNTER — Other Ambulatory Visit: Payer: Self-pay

## 2019-05-05 ENCOUNTER — Ambulatory Visit (INDEPENDENT_AMBULATORY_CARE_PROVIDER_SITE_OTHER): Payer: Medicare Other | Admitting: Primary Care

## 2019-05-05 ENCOUNTER — Encounter: Payer: Self-pay | Admitting: Primary Care

## 2019-05-05 ENCOUNTER — Ambulatory Visit (INDEPENDENT_AMBULATORY_CARE_PROVIDER_SITE_OTHER): Payer: Medicare Other | Admitting: Internal Medicine

## 2019-05-05 DIAGNOSIS — J849 Interstitial pulmonary disease, unspecified: Secondary | ICD-10-CM | POA: Diagnosis not present

## 2019-05-05 DIAGNOSIS — L501 Idiopathic urticaria: Secondary | ICD-10-CM | POA: Diagnosis not present

## 2019-05-05 NOTE — Progress Notes (Signed)
Virtual Visit via Telephone Note  I connected with Elizabeth Espinoza on 05/05/19 at 11:00 AM EDT by telephone and verified that I am speaking with the correct person using two identifiers.  Location: Patient: Home Provider: Office   I discussed the limitations, risks, security and privacy concerns of performing an evaluation and management service by telephone and the availability of in person appointments. I also discussed with the patient that there may be a patient responsible charge related to this service. The patient expressed understanding and agreed to proceed.   History of Present Illness: 79 year old female, smoked at age 57  PMH significant for ILD, chronic cough, dyspnea with restrictive lung disease, mitral valve prolapse, RLS . Patient of Dr. Chase Caller, last seen 03/16/19. Serology negative. 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 Therefore diagnosis is IPF. Recommended to start on pirfenidone. Needs spirometry with DLCO in 3-4 months.    04/20/2019 Patient contacted today to discuss starting newly prescribed Esbriet medication. Patient developed a rash on March 20th prior to taking anti-fibrotic. It started out under her arms and progressed to her neck, across her shoulders, waist line, groin and down her legs. She was seen by [facility], bx showed urticaria. Given prescription for oral prendisone and steriod cream. She still has some areas of hives on her chest and waist/sides. Today's is the last day of oral prednisone. Dermatology recommended she see an Allergy specialist.   05/05/2019 Patient contacted today for 2 weeks follow-up to discuss starting antifibrotic medication. She originally developed a rash before starting Esbriet. Advised she hold off on starting medication until rash improved and/or she saw allergy. Since we last spoke her hives have pretty much resolved. She saw allergy  and was advised to take generic zyrteic twice a day x 2 weeks then once daily. She has received Esbriet medication delivery and has already had education with pharmacy regarding possible side effects with medication. She reports some baseline acid reflux and plans on taking nexium OTC daily. She is aware we will need to check labs/LFTs every month for the first 6 moths then every 3 months. She has spirometry scheduled for May, however, since she was delayed in starting Esbriet we will re-schedule these to July.   Observations/Objective:  - No shortness of breath, wheezing or cough   Assessment and Plan:  ILD: - Start Esbriet on 05/06/19 (delayed d/t idiopathic urticaria prior to medication start) - FU in 4 weeks check liver function test - Plan meet every 4-6 week over the next several months while on new medication - Check spirometry and DLCO in 3-4 months (due in July)  Idiopathic Urticaria - Improved - Continue Cetirizine 16m BID x 2 weeks then daily  - Felt to be d/t pfizer coronavirus vaccine - Ok to continue hydroxychloroquine per Dr. KNeldon Mc  Follow Up Instructions:  - 4 weeks with Labs; Spirometry due in July    I discussed the assessment and treatment plan with the patient. The patient was provided an opportunity to ask questions and all were answered. The patient agreed with the plan and demonstrated an understanding of the instructions.   The patient was advised to call back or seek an in-person evaluation if the symptoms worsen or if the condition fails to improve as anticipated.  I provided 18 minutes of non-face-to-face time during this encounter.   EMartyn Ehrich NP

## 2019-05-05 NOTE — Patient Instructions (Addendum)
  OK to Start Esbriet medication as ordered now that hives have resolved   Orders: Liver function testing in 4 weeks   Follow-up: 4 weeks with Eustaquio Maize NP  Reschedule Spirometry with DLCO from May to July (d/t starting antifibrotic late)

## 2019-05-05 NOTE — Progress Notes (Signed)
Interstitial Lung Disease Multidisciplinary Conference   Elizabeth Espinoza    MRN [id number]    DOB 05-Dec-1940  Primary Care Physician:Burns, Claudina Lick, MD  Referring Physician: Brand Males  Time of Conference: 7.30am- 8.30am Date of conference: 05/05/2019  Location of Conference: -  Virtual  Participating Pulmonary: Dr. Brand Males, MD - yes,  Dr Marshell Garfinkel, MD - yes Pathology: Dr Jaquita Folds, MD - no , Dr Enid Cutter - yes Radiology: Dr Salvatore Marvel MD - no, Dr Vinnie Langton MD - yes,  Dr Lorin Picket, MD - no , Dr Eddie Candle - no Others: Dr Elsworth Soho  Brief History: Interstitial lung disease with CT scan October 2020 being indeterminate versus alternate diagnosis.  History of gardening.  Trace autoimmune ANA positive.  Mild progression since 2014 CT per report. PFTs only 1in Jan 2020 with isoldated reduction in DLCO to 58%.  BAL Jan 2021 - 55% PMN, 26% Mac, 18% lymphs, 1% eos. Did Clabe Seal -> POSITIVE for UIP.   Discussion point: Was CT indeterminate or alternative? What is rate of progression on CT?  Clinician to discuss utility of ENVISIA  Serology: trace positive ANA  MDD discussion of CT scan    - Date or time period of scan: 2014 lung base and then 2020 x 2. ->  Back in 2014 was groundglass opacities with subpleural sparing suggestive of COP.  But in 2020 there is no progression within the 2 scans done in 2020.  There is some traction bronchiectasis but no honeycombing.  The subpleural reticulation.  There is no groundglass opacities other than may be in the upper lobe suggestive of an infiltrate.  There is no craniocaudal gradient.  Overall current pattern is that of indeterminate for UIP  - What is the final conclusion per 2018 ATS/Fleischner Criteria -indeterminate for UIP  Pathology discussion of biopsy  -RNA genomic analysis consistent with UIP:    MDD Impression/Recs: -HRCT pattern indeterminate for UIP per conference.  Transbronchial biopsy  consistent with UIP.  Overall diagnosis of IPF per ATS criteria.   Time Spent in preparation and discussion:  > 30 min    SIGNATURE   Dr. Brand Males, M.D., F.C.C.P,  Pulmonary and Critical Care Medicine Staff Physician, [place] Director - Interstitial Lung Disease  Program  Pulmonary [place] [facility] [place], [place], [address]  Pager: [phone], If no answer or between  15:00h - 7:00h: call [phone] Telephone: [phone]  4:39 PM 05/05/2019 ...................................................................................................................Marland Kitchen References: Diagnosis of Hypersensitivity Pneumonitis in Adults. An Official ATS/JRS/ALAT Clinical Practice Guideline. Ragu G et al, [place] Aug 1;202(3):e36-e69.       Diagnosis of Idiopathic Pulmonary Fibrosis. An Official ATS/ERS/JRS/ALAT Clinical Practice Guideline. Raghu G et al, [place]. 2018 Sep 1;198(5):e44-e68.   IPF Suspected   Histopath ology Pattern      UIP  Probable UIP  Indeterminate for  UIP  Alternative  diagnosis    UIP  IPF  IPF  IPF  Non-IPF dx   HRCT   Probabe UIP  IPF  IPF  IPF (Likely)**  Non-IPF dx  Pattern  Indeterminate for UIP  IPF  IPF (Likely)**  Indeterminate  for IPF**  Non-IPF dx    Alternative diagnosis  IPF (Likely)**/ non-IPF dx  Non-IPF dx  Non-IPF dx  Non-IPF dx     Idiopathic pulmonary fibrosis diagnosis based upon HRCT  and Biopsy paterns.  ** IPF is the likely diagnosis when any of following features are present:  . Moderate-to-severe traction bronchiectasis/bronchiolectasis (defined as mild traction bronchiectasis/bronchiolectasis in four or more lobes including the lingual as a lobe, or moderate to severe traction bronchiectasis in two or more lobes) in a man over age 62 years or in a woman over age 57 years . Extensive (>30%)  reticulation on HRCT and an age >70 years  . Increased neutrophils and/or absence of lymphocytosis in BAL fluid  . Multidisciplinary discussion reaches a confident diagnosis of IPF.   **Indeterminate for IPF  . Without an adequate biopsy is unlikely to be IPF  . With an adequate biopsy may be reclassified to a more specific diagnosis after multidisciplinary discussion and/or additional consultation.   dx = diagnosis; HRCT = high-resolution computed tomography; IPF = idiopathic pulmonary fibrosis; UIP = usual interstitial pneumonia.

## 2019-05-06 DIAGNOSIS — L509 Urticaria, unspecified: Secondary | ICD-10-CM | POA: Diagnosis not present

## 2019-05-06 DIAGNOSIS — L661 Lichen planopilaris: Secondary | ICD-10-CM | POA: Diagnosis not present

## 2019-05-14 ENCOUNTER — Ambulatory Visit: Payer: Medicare Other

## 2019-05-18 ENCOUNTER — Telehealth: Payer: Self-pay | Admitting: Pharmacist

## 2019-05-18 MED ORDER — ONDANSETRON 4 MG PO TBDP: 4.0000 mg | ORAL_TABLET | Freq: Three times a day (TID) | ORAL | 0 refills | Status: DC | PRN

## 2019-05-18 NOTE — Telephone Encounter (Signed)
Patient called and left voicemail stating she was having issues with nausea.  Patient started Esbriet 2 weeks ago.    Returned patient call.  Patient states she tolerated the medication well with mild nausea until this morning.  Patient states that she is experiencing severe nausea.  She has tried drinking ginger ale which has helped some.  Patient wanted to know if there is any other medication she can take.    Advised patient that we could send in a prescription for Zofran to take every 8 hours as needed for nausea and vomiting.  Encourage patient to try nonpharmacologic and over-the-counter remedies first and then can take Zofran if symptoms do not improve.  Patient verbalized understanding.  Patient will be due for labs in 2 weeks.  She does not have a follow-up appointment scheduled.  We will follow-up with provider and when patient should be seen next in office and if patient requires extended Esbriet taper.   Mariella Saa, PharmD, [place], [place] Clinical Specialty Pharmacist [phone]  05/18/2019 4:01 PM

## 2019-05-19 NOTE — Telephone Encounter (Signed)
Emily  Pls set up tele visit with me next 2-3 weeks Patient shuld reach out to [employer] in interim as well based on Amber's Rx plan  Thanks   SIGNATURE    Dr. Brand Males, M.D., F.C.C.P,  Pulmonary and Critical Care Medicine Staff Physician, [place] Director - Interstitial Lung Disease  Program  Pulmonary [place] at [place], [place], [address]  Pager: [phone], If no answer or between  15:00h - 7:00h: call [phone] Telephone: [phone]  3:01 PM 05/19/2019

## 2019-05-20 NOTE — Telephone Encounter (Signed)
Have called and spoke with patient. She is scheduled for a televisit on 06/11/19 with MR nothing further needed at this time.

## 2019-05-21 ENCOUNTER — Telehealth: Payer: Self-pay | Admitting: Internal Medicine

## 2019-05-21 DIAGNOSIS — K219 Gastro-esophageal reflux disease without esophagitis: Secondary | ICD-10-CM

## 2019-05-21 NOTE — Progress Notes (Signed)
  Chronic Care Management   Note  05/21/2019 Name: Elizabeth Espinoza MRN: [id number] DOB: December 26, 1940  Elizabeth Espinoza is a 79 y.o. year old female who is a primary care patient of Burns, Claudina Lick, MD. I reached out to Elizabeth Espinoza by phone today in response to a referral sent by Elizabeth Espinoza's PCP, Binnie Rail, MD.   Elizabeth Espinoza was given information about Chronic Care Management services today including:  1. CCM service includes personalized support from designated clinical staff supervised by her physician, including individualized plan of care and coordination with other care providers 2. 24/7 contact phone numbers for assistance for urgent and routine care needs. 3. Service will only be billed when office clinical staff spend 20 minutes or more in a month to coordinate care. 4. Only one practitioner may furnish and bill the service in a calendar month. 5. The patient may stop CCM services at any time (effective at the end of the month) by phone call to the office staff.   Patient agreed to services and verbal consent obtained.    This note is not being shared with the patient for the following reason: To respect privacy (The patient or proxy has requested that the information not be shared).  Follow up plan:   Raynicia Dukes [employer] Scheduler

## 2019-06-03 ENCOUNTER — Other Ambulatory Visit (INDEPENDENT_AMBULATORY_CARE_PROVIDER_SITE_OTHER): Payer: Medicare Other

## 2019-06-03 ENCOUNTER — Other Ambulatory Visit: Payer: Self-pay

## 2019-06-03 DIAGNOSIS — J849 Interstitial pulmonary disease, unspecified: Secondary | ICD-10-CM | POA: Diagnosis not present

## 2019-06-03 LAB — HEPATIC FUNCTION PANEL
ALT: 10 U/L (ref 0–35)
AST: 17 U/L (ref 0–37)
Albumin: 4.1 g/dL (ref 3.5–5.2)
Alkaline Phosphatase: 88 U/L (ref 39–117)
Bilirubin, Direct: 0.1 mg/dL (ref 0.0–0.3)
Total Bilirubin: 0.4 mg/dL (ref 0.2–1.2)
Total Protein: 7 g/dL (ref 6.0–8.3)

## 2019-06-03 NOTE — Progress Notes (Signed)
Please let patient know her LFTs were normal. Continue Esbriet. Repeat LFTs in another 4 weeks please order (she has a visit with MR end of May)

## 2019-06-11 ENCOUNTER — Other Ambulatory Visit (HOSPITAL_COMMUNITY): Payer: Medicare Other

## 2019-06-11 ENCOUNTER — Ambulatory Visit (INDEPENDENT_AMBULATORY_CARE_PROVIDER_SITE_OTHER): Payer: Medicare Other | Admitting: Internal Medicine

## 2019-06-11 ENCOUNTER — Other Ambulatory Visit: Payer: Self-pay

## 2019-06-11 DIAGNOSIS — J84112 Idiopathic pulmonary fibrosis: Secondary | ICD-10-CM | POA: Diagnosis not present

## 2019-06-11 DIAGNOSIS — Z7189 Other specified counseling: Secondary | ICD-10-CM | POA: Diagnosis not present

## 2019-06-11 DIAGNOSIS — R110 Nausea: Secondary | ICD-10-CM | POA: Diagnosis not present

## 2019-06-11 NOTE — Patient Instructions (Addendum)
ICD-10-CM   1. Encounter for medication review and counseling  Z71.89   2. Nausea  R11.0   3. IPF (idiopathic pulmonary fibrosis) (HCC)  J84.112    IPF appears stable  Tolerating Esbriet with on and off mild to moderate nausea that is controlled with ondansetron  Urticarial rash was due to Covid vaccine and not related to pirfenidone/Esbriet  Liver function test May 2021 is normal  Plan -Continue Esbriet 3 pills 3 times a day  -To control nausea and show adequate spacing of at least 5 or 6 hours between the dosing  -Apply sunscreen  -Check liver function test mid June 2021  -Do spirometry as scheduled on mid July 2021  Follow-up -30-minute ILD clinic visit mid July 2021 but after spirometry

## 2019-06-11 NOTE — Progress Notes (Signed)
IOV 12/04/2017: Dr Lamonte Sakai: [name] is a 78 year old never smoker with a history of allergic rhinitis, GERD, hypothyroidism, MVP.  She is been seen in our office in the past by Dr. Gwenette Greet for restless leg syndrome-she still treated for this with Requip. On allegra, nexium prn.   She is referred today for dyspnea.  She reports that she has had an exercise routine where she walked w her husband 2 miles, exercises at the [facility], for several years. She has noticed more SOB, especially with mild hills. Sometimes now has to stop briefly to complete the walk. Occasional chest tightness, no wheeze. No overt CP. She reports daily cough, often in the am, sometimes clear mucous. She is on allegra. She had a walking stress test 09/2014 > reassuring ECG w exercise. Weight has been stable. Her last TSH was 06/25/17, 0.73.   Review of her notes shows hx possible GGI on prior CT 2014.   ROV 02/26/18 [name] --79 year old never smoker with a history of mitral valve prolapse whom I saw earlier this year for exertional dyspnea and chronic cough.  Pulmonary function testing consistent with restriction.  She has GERD and allergic rhinitis both of which could be contributing to her chronic cough.  I performed a high-resolution CT scan of the chest on 1/20 and reviewed today, this shows some patchy confluent subpleural reticular disease and groundglass attenuation with some minimal traction bronchiectasis and no frank honeycomb change.  This reflects a progression compared with 2014 and is an NSIP pattern.  Walking oximetry at her last visit did not show any exertional desaturation. She is still coughing, is using fexofenadine, takes nexium qod.   [place], had pesticide exposure.   ROV 08/26/2018 [name] --follow-up visit for 79 year old woman with a history of mitral valve prolapse, chronic cough, dyspnea with restrictive lung disease noted on pulmonary function testing.  High Res CT scan of the chest shows  some mild interstitial disease.  She was also having cough - better with flonase; still on nexium, allegra. Her exertional tolerance is improved, she has been exercising more. She remains on Requip for RLS.   We performed autoimmune labs last time, ANA positive at low titer (1: 80), SSA and SSB negative, RNP negative, RF negative, CCP negative, Anti-Smith negative, anti-SCL negative, DS DNA negative, anti-Jo negative, aldolase negative. She hasn't been seen in ILD clinic yet.    OV 10/08/2018  Subjective:  Patient ID: Elizabeth Espinoza, female , DOB: 07-19-1940 , age 68 y.o. , MRN: [id number] , ADDRESS: [address] [place] [address]   10/08/2018 -   Chief Complaint  Patient presents with  . Interstitial Lung Disease    Breathing the same as it was during August 2020 office visit with Dr. Lamonte Sakai     HPI Elizabeth Espinoza 79 y.o. -has been referred to the interstitial lung disease clinic because of findings of interstitial lung disease.  History is gathered from talking to her, review of Dr. Collene Gobble notes and also the integrated ILD questionnaire.  Briefly, she tells me that she was working out at [employer] and would just notice occasional dyspnea but she really did not compare it with other people.  Then in October 2019 she started walking 3 miles daily except on the days it rains with a husband.  During this time she noticed that she was falling really behind because of shortness of breath and exertional fatigue.  This resulted in subsequent  evaluation all documented above.  Findings of interstitial lung disease with subpleural reticulation suggestive of an alternative diagnosis.  Autoimmune profile essentially negative other than trace positive ANA.  She tells me that her significant major problem is exertional dyspnea when walking stairs or walking several miles.  She did not desaturate in our office several months ago.  She does not know if she desaturates when she exerts walking 3  miles.  Pulmonary function test earlier this year is just isolated low DLCO.  She also has like a cough.  Overall since the onset of the symptoms by exercising herself more and conditioning she is somewhat better.  She did see Dr. Kirk Ruths in May 2020 and in June 2020 had a coronary calcium CT which appears to have no calcium deposits.   [place] Integrated Comprehensive ILD Questionnaire  Symptoms:  -Dyspnea started suddenly and since it started it is better.  She says it is been present for years although she did tell me that it is only there since October 2019 when she noticed that.  Severity is listed below.  She does have a cough almost 1 year.  Since it started it is better.  It is mostly in the morning.  She does bring up some phlegm.  Early on in the morning it is green or yellow.  Since it started it is the same/better.  There is no wheezing.  She does have some chest tightness with this when she walks.  It is relieved by rest.  Cardiac work-up in June 2020 showed no calcium deposits.   Past Medical History : Positive for chronic longstanding acid reflux disease and thyroid disease..  In addition CT scan from January 2020 shows hiatal hernia that is small..  This presence of  sclerosis in the bony structures in January 2020.-Primary care physician has been sent a message today.  There is no asthma or COPD or heart failure rheumatoid arthritis or collagen vascular disease.  She does have GERD and hiatal hernia for several years to decades.  No sleep apnea.  No blood clots.  No hepatitis.  No tuberculosis.  No pleurisy.  , ANA positive at low titer (1: 80), SSA and SSB negative, RNP negative, RF negative, CCP negative, Anti-Smith negative, anti-SCL negative, DS DNA negative, anti-Jo negative, aldolase negative. She hasn't been seen in ILD clinic yet.    ROS:  -She does have fatigue for the last several years.  She does have some back and hip issues.  She does have dry eyes.  She does have  like some dysphagia.  There is presence of hiatal hernia-there is a small.  Acid reflux for several decades.  She also reports presence of nonspecific rash   FAMILY HISTORY of LUNG DISEASE: * -Her father died of mesothelioma in nineteen 83/1984 at the age of 75 otherwise no lung disease.   EXPOSURE HISTORY:   -When she was 17 she smokes cigarettes but otherwise no cigarette or tobacco use or electronic cigarette.  Never smoked marijuana.  No cocaine use no intravenous drug use.   HOME and HOBBY DETAILS :  -Single[place]family home [place] setting for the last 16 years in a 78 year old home.  No mold or mildew exposure in the [facility] duct or CPAP mask or humidifier.  No mold or mildew in the bathroom.  No pet birds in the house.  No misting [place].  No feather pillows no feather duvet.  No musical instruments.  She does some occasional gardening which she likes.  She does do some fine-needle work.   OCCUPATIONAL HISTORY (122 questions) :  = Essentially negative except for the fact when she was a child she did some tobacco growing.  She has done home gardening for 50 years.    PULMONARY TOXICITY HISTORY (27 items):  denies  Results for Elizabeth Espinoza, Elizabeth Espinoza (MRN [id number]) as of 11/07/2018 11:20  Ref. Range 02/26/2018 12:17  Anti Nuclear Antibody (ANA) Latest Ref Range: NEGATIVE  POSITIVE (A)  ANA Pattern 1 Unknown Nuclear, Speckled (A)  ANA Titer 1 Latest Units: titer 1:80 (H)  Anti JO-1 Latest Ref Range: 0.0 - 0.9 AI <1.6  Cyclic Citrullin Peptide Ab Latest Units: UNITS <16  ds DNA Ab Latest Units: IU/mL <1  RA Latex Turbid. Latest Ref Range: <14 IU/mL <14  ENA SM Ab Ser-aCnc Latest Ref Range: <1.0 NEG AI <1.0 NEG  Ribonucleic Protein(ENA) Antibody, IgG Latest Ref Range: <1.0 NEG AI <1.0 NEG  SSA (Ro) (ENA) Antibody, IgG Latest Ref Range: <1.0 NEG AI <1.0 NEG  SSB (La) (ENA) Antibody, IgG Latest Ref Range: <1.0 NEG AI <1.0 NEG  Scleroderma (Scl-70) (ENA) Antibody, IgG Latest Ref Range: <1.0 NEG AI  <1.0 NEG   Results for Elizabeth Espinoza, Elizabeth Espinoza (MRN [id number]) as of 11/07/2018 11:20  Ref. Range 01/29/2018 14:31  FVC-Pre Latest Units: L 2.97  FVC-%Pred-Pre Latest Units: % 98  Results for Elizabeth Espinoza, Elizabeth Espinoza (MRN [id number]) as of 11/07/2018 11:20  Ref. Range 01/29/2018 14:31  DLCO unc Latest Units: ml/min/mmHg 14.80  DLCO unc % pred Latest Units: % 54    OV 11/07/2018  Subjective:  Patient ID: Elizabeth Espinoza, female , DOB: 1940/03/13 , age 23 y.o. , MRN: [id number] , ADDRESS: [place] [id number]   11/07/2018 -   Chief Complaint  Patient presents with  . Follow-up    Patient reports that she has sob with any exertion.    Follow-up interstitial lung disease  HPI Elizabeth Espinoza 79 y.o. -last seen September 2020.  After that she was supposed to have follow-up high-resolution CT chest and spirometry DLCO.  For some reason the spirometry DLCO is not done.  In the interim her symptoms remain the same as shown by the symptom score below.  She does note when she does heavy exertion such as climbing stairs or long uphill walks her pulse ox drops to 84% but quickly regains.  She is not interested in portable oxygen.  She had a repeat high-resolution CT chest in September 2020 and when compared to January 2020 there is no significant change.  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  I personally visualized the film.  There is diffuse bilateral subpleural reticulation and some traction bronchiectasis.  I myself would say it may be this is indeterminate for UIP.  But there is some minimal air trapping and she is done some gardening work.  There is no upper zonal predominance that would fit in with hypersensitive pneumonitis.  She has incidental findings of bony sclerosis that is again repeated in the CT scan.  Her primary care physician Billey Gosling evaluated her.  I reviewed the note.  She is been referred to  Dr. Narda Rutherford in hematology who she is seeing November 12, 2018.  She is very confused about the fact that she has a bony sclerosis problem for which she is being referred to a blood doctor [hematologist] and she thought she is having a blood  test that was ordered by me.  I reviewed the chart and clarified this concepts   Ct Chest High Resolution  Result Date: 11/05/2018 CLINICAL DATA:  79 year old female with history of interstitial lung disease. Increased shortness of breath and cough over the past year. EXAM: CT CHEST WITHOUT CONTRAST TECHNIQUE: Multidetector CT imaging of the chest was performed following the standard protocol without intravenous contrast. High resolution imaging of the lungs, as well as inspiratory and expiratory imaging, was performed. COMPARISON:  High-resolution chest CT 02/10/2018. FINDINGS: Cardiovascular: Heart size is normal. There is no significant pericardial fluid, thickening or pericardial calcification. There is aortic atherosclerosis, as well as atherosclerosis of the great vessels of the mediastinum and the coronary arteries, including calcified atherosclerotic plaque in the left anterior descending and right coronary arteries. Mild calcifications of the mitral annulus. Mediastinum/Nodes: No pathologically enlarged mediastinal or hilar lymph nodes. Please note that accurate exclusion of hilar adenopathy is limited on noncontrast CT scans. Esophagus is unremarkable in appearance. No axillary lymphadenopathy. Lungs/Pleura: High-resolution images demonstrates some patchy areas of peripheral predominant septal thickening and subpleural reticulation, with mild cylindrical bronchiectasis and peripheral bronchiolectasis. No frank honeycombing confidently identified at this time. These findings have no definitive craniocaudal gradient. In the periphery of the mid to upper lungs there also some plaque-like areas of apparent pleuroparenchymal scarring and volume loss. Inspiratory and  expiratory imaging demonstrates minimal air trapping indicative of very mild small airways disease. Overall, these imaging findings appear stable compared to the prior study. Upper Abdomen: Aortic atherosclerosis. Musculoskeletal: Mild diffuse sclerosis throughout the visualized axial and appendicular skeleton, similar to prior examinations. There are no definite focal aggressive appearing lytic or blastic lesions noted in the visualized portions of the skeleton. IMPRESSION: 1. 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 2. Aortic atherosclerosis, in addition to 2 vessel coronary artery disease. Assessment for potential risk factor modification, dietary therapy or pharmacologic therapy may be warranted, if clinically indicated. 3. Persistent mild diffuse sclerosis throughout the visualized osseous structures without discrete aggressive appearing osseous lesions. Clinical correlation for signs and symptoms of potential infiltrative process such as myelofibrosis is suggested. Aortic Atherosclerosis (ICD10-I70.0). Electronically Signed   By: Vinnie Langton M.D.   On: 11/05/2018 14:36     OV 12/22/2018  Subjective:  Patient ID: Elizabeth Espinoza, female , DOB: Sep 06, 1940 , age 33 y.o. , MRN: [id number] , ADDRESS: [place] [address]   12/22/2018 -   Chief Complaint  Patient presents with  . Follow-up    Pt states she has been doing good since last visit. Pt is still coughing and will get up clear mucus in the morning.   Follow-up interstitial lung disease with CT scan October 2020 being indeterminate versus alternate diagnosis.  History of gardening.  Trace autoimmune ANA  positive.  Mild progression since 2014  HPI Elizabeth Espinoza 79 y.o. -returns for follow-up.  She presents with her husband who I am meeting for the first time.  In the interim she met Dr. Roxan Hockey thoracic surgeon for surgical lung biopsy.  Her husband also met with him.  I reviewed the note.  She tells me that given the morbidity with surgical lung biopsy she wants to undergo bronchoscopy with  lavage and transbronchial biopsy first.  She understands the inherent limitations of these procedure in terms of diagnosis.  But she wants to take the lower risk profile.  Overall she feels stable.  Her symptom score is improved compared to the past.  Her walking desaturation test shows exaggerated drop in pulse ox but this was done with her wearing the mask.  She did not feel any dyspnea.  In terms of her bony sclerosis she has seen Dr. Lorenso Courier at [facility].  I reviewed the note.  He has reassured her.  Risks of pneumothorax, hemothorax, sedation/anesthesia complications such as cardiac or respiratory arrest or hypotension, stroke and bleeding all explained. Benefits of diagnosis but limitations of non-diagnosis also explained. Patient verbalized understanding and wished to proceed.   They want to have the bronchoscopy after the holidays of Christmas and new year.  This is because their house is undergoing remodeling currently.       SYMPTOM SCALE - ILD      O2 use 10/08/2018  11/07/2018  12/22/2018   Shortness of Breath 0 -> 5 scale with 5 being worst (score 6 If unable to do)  ra  At rest 0 0 0  Simple tasks - showers, clothes change, eating, shaving 1 1 0  Household (dishes, doing bed, laundry) _0 Shopping 2 2 0.5  Walking level at own pace 2 1 0.5  Walking keeping up with others of same age 9 2 0  Walking up Stairs _1 Walking up [place] 5 5 3.5  Total (40 - 48) Dyspnea Score 20 18 9.52.5  How bad is your cough? 1 3 2.5  How bad is your fatigue 2 2.5 1.5 am, 4.5 pm      OV  03/16/2019  Subjective:  Patient ID: Elizabeth Espinoza, female , DOB: [birth year] , age 59 y.o. , MRN: [id number] , ADDRESS: [place] [address]   03/16/2019 -   Chief Complaint  Patient presents with  . Follow-up    PFT performed today.  Pt states she has been doing okay since last visit and states her breathing is about the same.     Finally able to review esults of [facility] send out test to [place]. Date of test is 02/05/2019. 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 -returns for follow-up to discuss bronchoscopy and lavage results.  In the interim no new respiratory issues.  They are all stable.  However she is having significant musculoskeletal issues with shoulder pain.  Serology from a year ago was negative.  They wanted to know about relatedness.  I told him it is probably not related.  We went over the bronchoscopy lavage results which showed some dominance of neutrophils consistent with UIP.  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   We had a long discussion about the benefits, risks and limitations of antifibrotic therapy.  We also discussed the choice between pirfenidone and nintedanib.    SYMPTOM SCALE - ILD 03/16/2019   O2 use ra  Shortness of Breath 0 -> 5 scale with 5 being worst (score 6 If unable to do)  At rest 0  Simple tasks - showers, clothes change, eating, shaving 1  Household (dishes, doing bed, laundry) 4  Shopping 3  Walking level at own pace 2  Walking up Stairs 5  Total (30-36) Dyspnea Score 15  How bad is  your cough? 0  How bad is your fatigue 0  How bad is nausea 0  How bad is vomiting?  0  How bad is diarrhea? 0  How bad is anxiety? 1.5  How bad is depression 0        Simple office walk 185 feet x  3 laps goal with forehead probe 10/08/2018  11/07/2018  12/22/2018  03/16/2019   O2 used ra ra ra - wor mask ra  Number laps completed _0 Comments about  pace good fast Mod pace Fast pace with mask  Resting Pulse Ox/HR 98% and 77/min 100% and 62/min 97% ad 70.min 99% and 59 min  Final Pulse Ox/HR 95% and 96/min 96% and 105/min 90%and 110/mi 96% and 119  Desaturated </= 88% no no no no  Desaturated <= 3% points yes Yes, 4 ppoints Yes, 7 ponits Yes, 3 points  Got Tachycardic >/= 90/min yes yes yes   Symptoms at end of test none none none Mild dysnea  Miscellaneous comments x  ? Mask related worsening     Results for Elizabeth Espinoza, Elizabeth Espinoza (MRN [id number]) as of 03/16/2019 11:12  Ref. Range 01/29/2018 14:31 03/16/2019 08:42  FVC-Pre Latest Units: L 2.97 2.91  FVC-%Pred-Pre Latest Units: % 98 97   Results for TAFFY, DELCONTE (MRN [id number]) as of 03/16/2019 11:12  Ref. Range 01/29/2018 14:31 03/16/2019 08:42  DLCO unc Latest Units: ml/min/mmHg 14.80 12.32  DLCO unc % pred Latest Units: % 54 60     OV 06/11/2019 - telephine visit. Patient identified with 2 pHI, risks, benefit, limitations of tele visit explained  Subjective:  Patient ID: Elizabeth Espinoza, female , DOB: May 14, 1940 , age 29 y.o. , MRN: [id number] , ADDRESS: [place] [id number] ate of test is 02/05/2019. 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 -in this telephone visit patient is now on pirfenidone.  She says she is on pirfenidone since mid April 2021.  She is on 3 pills 3 times a day.  She spacing them 4 hours apart but having some intermittent nausea every few days.  She did have an urticaria but that was before she started pirfenidone was related to Covid vaccine that is now resolved.  She is asking about exercising at the [facility] and Covid precautions.  I advised her because she is vaccinated that the risk is low but not 0 and to take adequate precautions as  tolerated.  She had liver function test and this is normal.  Her next appointment for pulmonary function test is in mid July 2021.  Recommended she make a face-to-face visit with me at that time.    Results for CHICQUITA, MENDEL (MRN [id number]) as of 06/11/2019 13:37  Ref. Range 03/16/2019 12:06 06/03/2019 12:48  AST Latest Ref Range: 0 - 37 U/L 16 17  ALT Latest Ref Range: 0 - 35 U/L 12 10   ROS - per HPI  Results for Elizabeth Espinoza, Elizabeth Espinoza (MRN [id number]) as of 06/11/2019 13:37  Ref. Range 01/29/2018 14:31 03/16/2019 08:42  FVC-Pre Latest Units: L 2.97 2.91  FVC-%Pred-Pre Latest Units: % 98 97   Results for Elizabeth Espinoza, Elizabeth Espinoza (MRN [id number]) as of 06/11/2019 13:37  Ref. Range 01/29/2018 14:31  03/16/2019 08:42  DLCO unc Latest Units: ml/min/mmHg 14.80 12.32  DLCO unc % pred Latest Units: % 54 60      has a past medical history of Allergic rhinitis, GERD (gastroesophageal reflux disease), Glaucoma, Hiatal hernia, Hyperlipidemia, Hypothyroidism, ILD (interstitial lung disease) ([place]), Lichen planopilaris, MVP (mitral valve prolapse), Restless legs, and Urticaria.   reports that she has never smoked. She has never used smokeless tobacco.  Past Surgical History:  Procedure Laterality Date  . APPENDECTOMY     with TAH  . CARDIAC CATHETERIZATION  1989   negative  . COLONOSCOPY  2005   Dr Olevia Perches  . COLONOSCOPY  01/2013   Dr Collene Mares  . esophageal dilation  07/04/2011   Dr Olevia Perches  . FLEXIBLE SIGMOIDOSCOPY  1999    Dr Olevia Perches  . KNEE ARTHROSCOPY  2001, 2005   Dr Percell Miller   bilat  . TOTAL ABDOMINAL HYSTERECTOMY     for Endometriosis; no BSO, Dr Colin Ina  . UPPER GI ENDOSCOPY  01/2013   Dr Collene Mares  . VIDEO BRONCHOSCOPY Bilateral 02/05/2019   Procedure: VIDEO BRONCHOSCOPY WITH FLUORO;  Surgeon: Brand Males, MD;  Location: [facility] ENDOSCOPY;  Service: Endoscopy;  Laterality: Bilateral;    Allergies  Allergen Reactions  . Caffeine     palpitations  . Chlorpheniramine-Pseudoeph     palpitations  .  Maxifed     palpitations    Immunization History  Administered Date(s) Administered  . Influenza Split 11/07/2010  . Influenza Whole 10/30/2009, 11/27/2011  . Influenza, High Dose Seasonal PF 10/27/2012, 11/05/2013, 10/01/2017, 09/11/2018  . Influenza-Unspecified 11/24/2014, 10/07/2015, 10/08/2016  . PFIZER SARS-COV-2 Vaccination 03/14/2019, 04/06/2019  . Pneumococcal Conjugate-13 10/07/2015  . Pneumococcal Polysaccharide-23 07/02/2017  . Tdap 02/11/2012  . Zoster 01/09/2012  . Zoster Recombinat (Shingrix) 10/01/2017, 12/03/2017    Family History  Problem Relation Age of Onset  . Lung cancer Father        Asbestos with mesothelioma  . Heart disease Paternal Grandmother        ? etiology  . Diabetes Paternal Grandmother   . Diabetes Brother   . Uterine cancer Paternal Aunt   . Stroke Paternal Grandfather        > 28  . Heart attack Brother 34  . Allergies Sister   . Aneurysm Mother        congenital  . Stroke Brother        two strokes  . Barrett's esophagus Brother      Current Outpatient Medications:  .  aspirin EC 81 MG tablet, Take 1 tablet (81 mg total) by mouth daily., Disp: 90 tablet, Rfl: 3 .  Biotin 5000 MCG TABS, Take 5,000 mcg by mouth daily., Disp: , Rfl:  .  camphor-menthol (SARNA) lotion, Apply 1 application topically as needed for itching., Disp: , Rfl:  .  Carboxymethylcellul-Glycerin (REFRESH RELIEVA OP), Place 1 drop into both eyes every 6 (six) hours as needed (dry eyes)., Disp: , Rfl:  .  cetirizine (ZYRTEC) 10 MG tablet, Take 10 mg by mouth 2 (two) times daily., Disp: , Rfl:  .  cholecalciferol (VITAMIN D3) 25 MCG (1000 UT) tablet, Take 1,000 Units by mouth daily., Disp: , Rfl:  .  clobetasol (TEMOVATE) 0.05 % external solution, SMARTSIG:1 Milliliter(s) Topical Twice Daily, Disp: , Rfl:  .  diphenhydrAMINE (BENADRYL) 25 MG tablet, as needed., Disp: , Rfl:  .  esomeprazole (NEXIUM) 20 MG capsule, Take 1 capsule (20 mg total) by mouth daily at 12  noon. (Patient taking differently: Take  20 mg by mouth daily as needed (heartburn). ), Disp: , Rfl:  .  fexofenadine (ALLEGRA) 180 MG tablet, Take 180 mg by mouth daily., Disp: , Rfl:  .  fluticasone (FLONASE) 50 MCG/ACT nasal spray, Place 1 spray into both nostrils daily as needed for allergies or rhinitis., Disp: , Rfl:  .  hydroxychloroquine (PLAQUENIL) 200 MG tablet, Take 200 mg by mouth 2 (two) times daily., Disp: , Rfl:  .  hydrOXYzine (ATARAX/VISTARIL) 25 MG tablet, Take 25 mg by mouth 3 (three) times daily., Disp: , Rfl:  .  magnesium oxide (MAG-OX) 400 MG tablet, Take 400 mg by mouth daily., Disp: , Rfl:  .  Multiple Vitamins-Minerals (ZINC PO), Take 1 tablet by mouth., Disp: , Rfl:  .  ondansetron (ZOFRAN ODT) 4 MG disintegrating tablet, Take 1 tablet (4 mg total) by mouth every 8 (eight) hours as needed for nausea or vomiting., Disp: 20 tablet, Rfl: 0 .  pravastatin (PRAVACHOL) 40 MG tablet, TAKE 1 TABLET BY MOUTH  DAILY (Patient taking differently: Take 40 mg by mouth at bedtime. ), Disp: 90 tablet, Rfl: 2 .  predniSONE (DELTASONE) 10 MG tablet, Take 10 mg by mouth daily with breakfast. Finish on 04/20/2019, Disp: , Rfl:  .  rOPINIRole (REQUIP) 0.5 MG tablet, TAKE 2 TABLETS BY MOUTH  AFTER DINNER EACH NIGHT. (Patient taking differently: Take 0.5-1 mg by mouth at bedtime. Dose depends on severity of restless legs), Disp: 180 tablet, Rfl: 2 .  SYNTHROID 75 MCG tablet, TAKE 1 TABLET BY MOUTH  DAILY BEFORE BREAKFAST (Patient taking differently: Take 75 mcg by mouth daily before breakfast. Uses Name Brand), Disp: 90 tablet, Rfl: 2 .  traMADol (ULTRAM) 50 MG tablet, Take 50 mg by mouth every 6 (six) hours as needed., Disp: , Rfl:  .  triamcinolone cream (KENALOG) 0.1 %, APPLY TO AFFECTED AREA TWICE DAILY AS NEEDED FOR ITCHING, Disp: , Rfl:       Objective:   There were no vitals filed for this visit.  Estimated body mass index is 25.32 kg/m as calculated from the following:   Height as of  04/30/19: 5' 4.5" (1.638 m).   Weight as of 04/30/19: 149 lb 12.8 oz (67.9 kg).  _0 @  There were no vitals filed for this visit.   Physical Exam sounded normal on the phone        Assessment:       ICD-10-CM   1. Encounter for medication review and counseling  Z71.89   2. Nausea  R11.0   3. IPF (idiopathic pulmonary fibrosis) ([place])  M38.177        Plan:     Patient Instructions     ICD-10-CM   1. Encounter for medication review and counseling  Z71.89   2. Nausea  R11.0   3. IPF (idiopathic pulmonary fibrosis) ([facility])  J84.112    IPF appears stable  Tolerating Esbriet with on and off mild to moderate nausea that is controlled with ondansetron  Urticarial rash was due to Covid vaccine and not related to pirfenidone/Esbriet  Liver function test May 2021 is normal  Plan -Continue Esbriet 3 pills 3 times a day  -To control nausea and show adequate spacing of at least 5 or 6 hours between the dosing  -Apply sunscreen  -Check liver function test mid June 2021  -Do spirometry as scheduled on mid July 2021  Follow-up -30-minute ILD clinic visit mid July 2021 but after spirometry     SIGNATURE    Dr.  Brand Males, M.D., F.C.C.P,  Pulmonary and Critical Care Medicine Staff Physician, [place] Director - Interstitial Lung Disease  Program  P[facility] [place] at [place], [place], [address]  Pager: [phone], If no answer or between  15:00h - 7:00h: call [phone] Telephone: [phone]  1:46 PM 06/11/2019

## 2019-06-18 NOTE — Addendum Note (Signed)
Addended by: Karle Barr on: 06/18/2019 04:48 PM   Modules accepted: Orders

## 2019-06-24 ENCOUNTER — Other Ambulatory Visit: Payer: Self-pay

## 2019-06-24 ENCOUNTER — Ambulatory Visit: Payer: Medicare Other | Admitting: Pharmacist

## 2019-06-24 DIAGNOSIS — R7303 Prediabetes: Secondary | ICD-10-CM

## 2019-06-24 DIAGNOSIS — E782 Mixed hyperlipidemia: Secondary | ICD-10-CM

## 2019-06-24 DIAGNOSIS — Z8679 Personal history of other diseases of the circulatory system: Secondary | ICD-10-CM

## 2019-06-24 DIAGNOSIS — K219 Gastro-esophageal reflux disease without esophagitis: Secondary | ICD-10-CM

## 2019-06-24 NOTE — Chronic Care Management (AMB) (Signed)
Chronic Care Management Pharmacy  Name: Elizabeth Espinoza  MRN: [id number] DOB: [birth year]  Chief Complaint/ HPI  Elizabeth Espinoza,  79 y.o. , female presents for their Initial CCM visit with the clinical pharmacist via telephone due to COVID-19 Pandemic.  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 Divorced once and remarried, now married 44 years. Considers herself pretty healthy, used to exercise 3 times a week before Oct 2019 when breathing issues started.  Office Visits: 11/18/18 Dr Quay Burow OV: shoulder pain after yardwork. Rec diclofenac gel, tizanidine. Hesitant to try tramadol.  Consult Visit: 06/11/19 Dr Chase Caller (pulmonary): ILD/IPF/bronchiectasis - continue Esbriet, ondansetron PRN for associated nausea. Spirometry due July 2021.  05/05/19 NP Volanda Napoleon (pulmonary): start Esbriet, f/u 4 wks LFT. 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 Continue hydroxychloroquine  04/20/19 NP Volanda Napoleon (pulmonary): urticaria, referred to allergy. Hold off on Esbriet until hives resolve.  04/09/19 Dr Toribio Harbour (pulm [facility]): initiation of Esbriet (ended up delayed due to urticaria as above)  02/25/19 Dr Fontaine No (dermatology): on hydroxychloroquine for scalp disorder.  12/29/18 Dr Stanford Breed (cardiology): seen for dyspnea, ECHO 05/2018 normal LV function, grade 2 diastolic dysfunction, cardiac CT minimal nonobstructive coronary disease. Chest CT Oct 2020 - aortic atherosclerosis and 2-vessel coronary artery atherosclerosis. CAD assessed as minimal, continue medical tx with statin and aspirin.  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 Started pirfenidone, refer to  pharmacist.   11/12/18 Dr Lorenso Courier (heme/onc): diffuse sclerosis of bones noted on CT scan; found no hematological concerns so no indication for bone marrow biopsy. All labs normal, no need for f/u.   Allergies  Allergen Reactions  . Caffeine     palpitations  . Chlorpheniramine-Pseudoeph     palpitations  . Maxifed     palpitations   Medications: Outpatient Encounter Medications as of 06/24/2019  Medication Sig  . aspirin EC 81 MG tablet Take 1 tablet (81 mg total) by mouth daily.  . Biotin 5000 MCG TABS Take 5,000 mcg by mouth daily.  . camphor-menthol (SARNA) lotion Apply 1 application topically as needed for itching.  . Carboxymethylcellul-Glycerin (REFRESH RELIEVA OP) Place 1 drop into both eyes every 6 (six) hours as needed (dry eyes).  . cetirizine (ZYRTEC) 10 MG tablet Take 10 mg by mouth 2 (two) times daily.  . cholecalciferol (VITAMIN D3) 25 MCG (1000 UT) tablet Take 1,000 Units by mouth daily.  . clobetasol (TEMOVATE) 0.05 % external solution SMARTSIG:1 Milliliter(s) Topical Twice Daily  . esomeprazole (NEXIUM) 20 MG capsule Take 1 capsule (20 mg total) by mouth daily at 12 noon. (Patient taking differently: Take 20 mg by mouth daily as needed (heartburn). )  . fluticasone (FLONASE) 50 MCG/ACT nasal spray Place 1 spray into both nostrils daily as needed for allergies or rhinitis.  . hydroxychloroquine (PLAQUENIL) 200 MG tablet Take 200 mg by mouth 2 (two) times daily.  . magnesium oxide (MAG-OX) 400 MG tablet Take 400 mg by mouth daily.  . Multiple Vitamins-Minerals (ZINC PO) Take 1 tablet by mouth.  . naproxen sodium (ALEVE) 220 MG tablet Take 220 mg by mouth as needed.  . ondansetron (ZOFRAN ODT) 4 MG disintegrating tablet Take 1 tablet (4 mg total) by mouth every  8 (eight) hours as needed for nausea or vomiting.  . Pirfenidone (ESBRIET) 267 MG CAPS Take 3 capsules by mouth 3 (three) times daily as needed.   . pravastatin (PRAVACHOL) 40 MG tablet TAKE 1 TABLET BY MOUTH  DAILY  (Patient taking differently: Take 40 mg by mouth at bedtime. )  . rOPINIRole (REQUIP) 0.5 MG tablet TAKE 2 TABLETS BY MOUTH  AFTER DINNER EACH NIGHT. (Patient taking differently: Take 0.5-1 mg by mouth at bedtime. Dose depends on severity of restless legs)  . SYNTHROID 75 MCG tablet TAKE 1 TABLET BY MOUTH  DAILY BEFORE BREAKFAST (Patient taking differently: Take 75 mcg by mouth daily before breakfast. Uses Name Brand)  . diphenhydrAMINE (BENADRYL) 25 MG tablet as needed.  . fexofenadine (ALLEGRA) 180 MG tablet Take 180 mg by mouth daily.  . hydrOXYzine (ATARAX/VISTARIL) 25 MG tablet Take 25 mg by mouth 3 (three) times daily.  . predniSONE (DELTASONE) 10 MG tablet Take 10 mg by mouth daily with breakfast. Finish on 04/20/2019  . traMADol (ULTRAM) 50 MG tablet Take 50 mg by mouth every 6 (six) hours as needed.  . triamcinolone cream (KENALOG) 0.1 % APPLY TO AFFECTED AREA TWICE DAILY AS NEEDED FOR ITCHING  . [DISCONTINUED] loratadine (CLARITIN) 10 MG tablet Take 10 mg by mouth daily as needed.    . [DISCONTINUED] omeprazole (PRILOSEC) 40 MG capsule Take 1 capsule (40 mg total) by mouth daily.   No facility-administered encounter medications on file as of 06/24/2019.     Current Diagnosis/Assessment:  SDOH Interventions     Most Recent Value  SDOH Interventions  Housing Interventions  Intervention Not Indicated     Goals Addressed            This Visit's Progress   . Pharmacy Care Plan       CARE PLAN ENTRY  Current Barriers:  . Chronic Disease Management support, education, and care coordination needs related to Hyperlipidemia, Coronary Artery Disease, GERD, and Interstitial Lung Disease   Hyperlipidemia/Coronary Artery Disease Lipid Panel  Component Value Date/Time   CHOL 168 07/04/2018 1148   TRIG 136.0 07/04/2018 1148   HDL 52.30 07/04/2018 1148   LDLCALC 88 07/04/2018 1148 .  Pharmacist Clinical Goal(s): o Over the next 180 days, patient will work with PharmD and providers  to maintain LDL goal < 100 . Current regimen:  o Pravastatin 40 mg at bedtime o Aspirin 81 mg daily . Interventions: o Discussed cholesterol goals and benefits of medication for prevention of heart attack and stroke . Patient self care activities - Over the next 180 days, patient will: o Continue medications as prescribed o Continue low cholesterol diet  Predabetes Lab Results  Component Value Date/Time   HGBA1C 6.2 07/04/2018 11:48 AM   HGBA1C 6.0 07/02/2017 12:27 PM .  Pharmacist Clinical Goal(s): o Over the next 180 days, patient will work with PharmD and providers to maintain A1c goal <6.5% . Current regimen:  o No medications indicated . Interventions: o Discussed diabetes threshold is A1c > 6.5% o Discussed importance of diet and exercise to prevent progression to diabetes . Patient self care activities - Over the next 180 days, patient will: o Continue diet and exercise routine  Interstitial Lung Disease . Pharmacist Clinical Goal(s) o Over the next 180 days, patient will work with PharmD and providers to slow progression of lung disease . Current regimen:  o Pirfenidone (Esbriet) 267 mg- 3 tablets 3 times daily with food o Ondansetron ODT 4 mg every 8 hours  as needed . Interventions: o Discussed nausea as known side effect of Esbriet o Counseled to use ondansetron if nausea interferes with ability to eat or drink . Patient self care activities - Over the next 90 days, patient will: o Continue medication as prescribed o Use ondansetron as described above  GERD . Pharmacist Clinical Goal(s) o Over the next 180 days, patient will work with PharmD and providers to reduce costs of antacid therapy . Current regimen:  o Esomeprazole 20 mg - 1-2 tablets once daily . Interventions: o Esomeprazole is Tier 3 with insurance plan ($47/month); will attempt tier exemption since other PPIs have failed to control symptoms . Patient self care activities - Over the next 180 days,  patient will: o Continue medication as directed  Medication management . Pharmacist Clinical Goal(s): o Over the next 180 days, patient will work with PharmD and providers to maintain optimal medication adherence . Current pharmacy: [facility] . Interventions o Comprehensive medication review performed. o Continue current medication management strategy . Patient self care activities - Over the next 180 days, patient will: o Focus on medication adherence by fill date o Take medications as prescribed o Report any questions or concerns to PharmD and/or provider(s)  Initial goal documentation       Hyperlipidemia   Lipid Panel     Component Value Date/Time   CHOL 168 07/04/2018 1148   TRIG 136.0 07/04/2018 1148   HDL 52.30 07/04/2018 1148   [place] 88 07/04/2018 1148     The 10-year ASCVD risk score Mikey Bussing DC Jr., et al., 2013) is: 24.7%   Values used to calculate the score:     Age: 31 years     Sex: Female     Is Non-Hispanic African American: No     Diabetic: No     Tobacco smoker: No     Systolic Blood Pressure: 073 mmHg     Is BP treated: No     HDL Cholesterol: 52.3 mg/dL     Total Cholesterol: 168 mg/dL   Patient has failed these meds in past: n/a Patient is currently controlled on the following medications:  . Pravastatin 40 mg HS . Aspirin 81 mg daily  We discussed:  diet and exercise extensively; cholesterol goals  Plan  Continue current medications and control with diet and exercise  ILD/bronchiectasis   Patient has failed these meds in past: n/a Patient is currently controlled on the following medications:  . Pirfenidone (Esbriet) 267 mg - 3 tablets TID with food  Flonase nasal spray PRN . Ondansetron 4 mg PRN  We discussed:  Pt reports increased nausea since she increased Esbriet to 3 tablets TID, she is trying to avoid using ondansetron. Counseled to use ondansetron if nausea prevents her from eating or  drinking.  Plan  Continue current medications  Prediabetes   Recent Relevant Labs: Lab Results  Component Value Date/Time   HGBA1C 6.2 07/04/2018 11:48 AM   HGBA1C 6.0 07/02/2017 12:27 PM   GFR 72.65 07/04/2018 11:48 AM   GFR 74.09 07/02/2017 12:27 PM   No medication indicated.   We discussed: diet and exercise extensively; discussed A1c threshold for diabetes  Plan  Continue control with diet and exercise  GERD/Barrett's esophagus   Patient has failed these meds in past: omeprazole, pantoprazole Patient is currently controlled on the following medications:  . Esomperazole 20 mg daily - 1-2 tablets daily  We discussed:  Pt reports esomeprazole is the only PPI that works  for her; she gets OTC Nexium because it is Tier 3 on her insurance ($47/month). Can attempt tier exception  Plan  Continue current medications   Hypothyroidism   Lab Results  Component Value Date/Time   TSH 1.48 09/30/2018 11:15 AM   TSH 0.33 (L) 07/28/2018 10:33 AM   TSH 0.43 07/04/2018 11:48 AM   Patient has failed these meds in past: n/a Patient is currently controlled on the following medications:  . Synthroid 75 mcg daily  We discussed:  Husband wakes her up at 6am to take Synthroid. She separates from PPI by 4 hours.  Plan  Continue current medications  Allergies   Patient has failed these meds in past: fexofenadine, diphenhydramine, Sarna, triamcinolone cream Patient is currently controlled on the following medications:  . Cetirizine 10 mg daily . Hydroxyzine 25 mg PRN . Refresh eye drops PRN . Sarna lotion (camphor-menthol)  We discussed:  Pt reports urticaria has completely resolved, she continues cetirizine daily and has other meds as needed  Plan  Continue current medications  Scalp disorder   Patient has failed these meds in past: n/a Patient is currently controlled on the following medications:   Hydroxychloroquine 200 mg BID  Clobetasol 0.05% solution PRN  We  discussed:  Pt started treatment for scalp disorder this year; she reports she was told she may not be on hydroxychloroquine long term. She follows with dermatologist.  Plan  Continue current medications  RLS   Patient has failed these meds in past: n/a Patient is currently controlled on the following medications:  . Ropinirole 0.5 mg - 1-2 tab HS  We discussed:  Pt reports RLS is well controlled with current dose  Plan  Continue current medications  Shoulder Pain   Patient has failed these meds in past: tramadol, diclofenac gel Patient is currently controlled on the following medications:  . Aleve PRN  We discussed:  Pt received steroid shots in each arm earlier this year which helped with pain. She uses Aleve very sparingly and reports Tylenol does not help her at all.  Plan  Continue current medications    Health Maintenance   Patient is currently controlled on the following medications:  Marland Kitchen Magnesium oxide 400 mg daily . Biotin 5000 mcg daily (hair/skin/nails) . Vitamin D3 1000 IU daily . Zinc . Probiotic  We discussed:  Patient is satisfied with current OTC regimen and denies issues  Plan  Continue current medications   Medication Management   [facility] for all medications Uses pill box? Yes Pt endorses 99% compliance  We discussed: Pt uses mail order for maintenance medications and [facility] for acute rx's. All medications are covered by insurance and affordable (with exception of OTC Nexium).   Plan  Continue current medication management strategy    Follow up: 6 month phone visit  Charlene Brooke, PharmD Clinical Pharmacist [facility] [phone]

## 2019-06-24 NOTE — Patient Instructions (Addendum)
Visit Information  Thank you for meeting with me to discuss your medications! I look forward to working with you to achieve your health care goals. Below is a summary of what we talked about during the visit:  Goals Addressed            This Visit's Progress   . Pharmacy Care Plan       CARE PLAN ENTRY  Current Barriers:  . Chronic Disease Management support, education, and care coordination needs related to Hyperlipidemia, Coronary Artery Disease, GERD, and Interstitial Lung Disease   Hyperlipidemia/Coronary Artery Disease Lipid Panel  Component Value Date/Time   CHOL 168 07/04/2018 1148   TRIG 136.0 07/04/2018 1148   HDL 52.30 07/04/2018 1148   LDLCALC 88 07/04/2018 1148 .  Pharmacist Clinical Goal(s): o Over the next 180 days, patient will work with PharmD and providers to maintain LDL goal < 100 . Current regimen:  o Pravastatin 40 mg at bedtime o Aspirin 81 mg daily . Interventions: o Discussed cholesterol goals and benefits of medication for prevention of heart attack and stroke . Patient self care activities - Over the next 180 days, patient will: o Continue medications as prescribed o Continue low cholesterol diet  Predabetes Lab Results  Component Value Date/Time   HGBA1C 6.2 07/04/2018 11:48 AM   HGBA1C 6.0 07/02/2017 12:27 PM .  Pharmacist Clinical Goal(s): o Over the next 180 days, patient will work with PharmD and providers to maintain A1c goal <6.5% . Current regimen:  o No medications indicated . Interventions: o Discussed diabetes threshold is A1c > 6.5% o Discussed importance of diet and exercise to prevent progression to diabetes . Patient self care activities - Over the next 180 days, patient will: o Continue diet and exercise routine  Interstitial Lung Disease . Pharmacist Clinical Goal(s) o Over the next 180 days, patient will work with PharmD and providers to slow progression of lung disease . Current regimen:  o Pirfenidone (Esbriet) 267 mg-  3 tablets 3 times daily with food o Ondansetron ODT 4 mg every 8 hours as needed . Interventions: o Discussed nausea as known side effect of Esbriet o Counseled to use ondansetron if nausea interferes with ability to eat or drink . Patient self care activities - Over the next 90 days, patient will: o Continue medication as prescribed o Use ondansetron as described above  GERD . Pharmacist Clinical Goal(s) o Over the next 180 days, patient will work with PharmD and providers to reduce costs of antacid therapy . Current regimen:  o Esomeprazole 20 mg - 1-2 tablets once daily . Interventions: o Esomeprazole is Tier 3 with insurance plan ($47/month); will attempt tier exemption since other PPIs have failed to control symptoms . Patient self care activities - Over the next 180 days, patient will: o Continue medication as directed  Medication management . Pharmacist Clinical Goal(s): o Over the next 180 days, patient will work with PharmD and providers to maintain optimal medication adherence . Current pharmacy: [facility]; [facility] . Interventions o Comprehensive medication review performed. o Continue current medication management strategy . Patient self care activities - Over the next 180 days, patient will: o Focus on medication adherence by fill date o Take medications as prescribed o Report any questions or concerns to PharmD and/or provider(s)  Initial goal documentation       Elizabeth Espinoza was given information about Chronic Care Management services today including:  1. CCM service includes personalized support from designated clinical staff  supervised by her physician, including individualized plan of care and coordination with other care providers 2. 24/7 contact phone numbers for assistance for urgent and routine care needs. 3. Standard insurance, coinsurance, copays and deductibles apply for chronic care management only during months in which we provide at  least 20 minutes of these services. Most insurances cover these services at 100%, however patients may be responsible for any copay, coinsurance and/or deductible if applicable. This service may help you avoid the need for more expensive face-to-face services. 4. Only one practitioner may furnish and bill the service in a calendar month. 5. The patient may stop CCM services at any time (effective at the end of the month) by phone call to the office staff.  Patient agreed to services and verbal consent obtained.   The patient verbalized understanding of instructions provided today and agreed to receive a mailed copy of patient instruction and/or educational materials. Telephone follow up appointment with pharmacy team member scheduled for: 6 months  Charlene Brooke, PharmD Clinical Pharmacist [facility] After Age 10 After age 47, you are at a higher risk for certain long-term diseases and infections as well as injuries from falls. Falls are a major cause of broken bones and head injuries in people who are older than age 16. Getting regular preventive care can help to keep you healthy and well. Preventive care includes getting regular testing and making lifestyle changes as recommended by your health care provider. Talk with your health care provider about:  Which screenings and tests you should have. A screening is a test that checks for a disease when you have no symptoms.  A diet and exercise plan that is right for you. What should I know about screenings and tests to prevent falls? Screening and testing are the best ways to find a health problem early. Early diagnosis and treatment give you the best chance of managing medical conditions that are common after age 67. Certain conditions and lifestyle choices may make you more likely to have a fall. Your health care provider may recommend:  Regular vision checks. Poor vision and  conditions such as cataracts can make you more likely to have a fall. If you wear glasses, make sure to get your prescription updated if your vision changes.  Medicine review. Work with your health care provider to regularly review all of the medicines you are taking, including over-the-counter medicines. Ask your health care provider about any side effects that may make you more likely to have a fall. Tell your health care provider if any medicines that you take make you feel dizzy or sleepy.  Osteoporosis screening. Osteoporosis is a condition that causes the bones to get weaker. This can make the bones weak and cause them to break more easily.  Blood pressure screening. Blood pressure changes and medicines to control blood pressure can make you feel dizzy.  Strength and balance checks. Your health care provider may recommend certain tests to check your strength and balance while standing, walking, or changing positions.  Foot health exam. Foot pain and numbness, as well as not wearing proper footwear, can make you more likely to have a fall.  Depression screening. You may be more likely to have a fall if you have a fear of falling, feel emotionally low, or feel unable to do activities that you used to do.  Alcohol use screening. Using too much alcohol can affect your balance and may make you more  likely to have a fall. What actions can I take to lower my risk of falls? General instructions  Talk with your health care provider about your risks for falling. Tell your health care provider if: ? You fall. Be sure to tell your health care provider about all falls, even ones that seem minor. ? You feel dizzy, sleepy, or off-balance.  Take over-the-counter and prescription medicines only as told by your health care provider. These include any supplements.  Eat a healthy diet and maintain a healthy weight. A healthy diet includes low-fat dairy products, low-fat (lean) meats, and fiber from whole  grains, beans, and lots of fruits and vegetables. Home safety  Remove any tripping hazards, such as rugs, cords, and clutter.  Install safety equipment such as grab bars in bathrooms and safety rails on stairs.  Keep rooms and walkways well-lit. Activity   Follow a regular exercise program to stay fit. This will help you maintain your balance. Ask your health care provider what types of exercise are appropriate for you.  If you need a cane or walker, use it as recommended by your health care provider.  Wear supportive shoes that have nonskid soles. Lifestyle  Do not drink alcohol if your health care provider tells you not to drink.  If you drink alcohol, limit how much you have: ? 0-1 drink a day for women. ? 0-2 drinks a day for men.  Be aware of how much alcohol is in your drink. In the [place] one drink equals one typical bottle of beer (12 oz), one-half glass of wine (5 oz), or one shot of hard liquor (1 oz).  Do not use any products that contain nicotine or tobacco, such as cigarettes and e-cigarettes. If you need help quitting, ask your health care provider. Summary  Having a healthy lifestyle and getting preventive care can help to protect your health and wellness after age 51.  Screening and testing are the best way to find a health problem early and help you avoid having a fall. Early diagnosis and treatment give you the best chance for managing medical conditions that are more common for people who are older than age 10.  Falls are a major cause of broken bones and head injuries in people who are older than age 36. Take precautions to prevent a fall at home.  Work with your health care provider to learn what changes you can make to improve your health and wellness and to prevent falls. This information is not intended to replace advice given to you by your health care provider. Make sure you discuss any questions you have with your health care provider. Document Revised:  05/01/2018 Document Reviewed: 11/21/2016 Elsevier Patient Education  2020 [employer].

## 2019-06-30 ENCOUNTER — Other Ambulatory Visit: Payer: Self-pay | Admitting: Internal Medicine

## 2019-07-02 ENCOUNTER — Other Ambulatory Visit (INDEPENDENT_AMBULATORY_CARE_PROVIDER_SITE_OTHER): Payer: Medicare Other

## 2019-07-02 DIAGNOSIS — J849 Interstitial pulmonary disease, unspecified: Secondary | ICD-10-CM | POA: Diagnosis not present

## 2019-07-02 DIAGNOSIS — Z5181 Encounter for therapeutic drug level monitoring: Secondary | ICD-10-CM

## 2019-07-02 LAB — HEPATIC FUNCTION PANEL
ALT: 16 U/L (ref 0–35)
AST: 21 U/L (ref 0–37)
Albumin: 4.5 g/dL (ref 3.5–5.2)
Alkaline Phosphatase: 93 U/L (ref 39–117)
Bilirubin, Direct: 0.1 mg/dL (ref 0.0–0.3)
Total Bilirubin: 0.4 mg/dL (ref 0.2–1.2)
Total Protein: 7.2 g/dL (ref 6.0–8.3)

## 2019-07-05 NOTE — Progress Notes (Signed)
Subjective:    Patient ID: Elizabeth Espinoza, female    DOB: [birth year], 79 y.o.   MRN: [id number]  HPI She is here for a physical exam.   She is now on the esbriet and it does cause some fatigue and nausea.  Her oxygen level drops with exercise - down to 83% which is an improvement.   She does have GERD despite taking the nexium daily.  It feels like the food gets stuck at the bottom of her esophagus.  She has had barrett's in the past and has a hiatal hernia.   Medications and allergies reviewed with patient and updated if appropriate.  Patient Active Problem List   Diagnosis Date Noted  . 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 Bilateral shoulder pain 11/18/2018  . Bony sclerosis 10/21/2018  . Chronic cough 01/29/2018  . Rash and nonspecific skin eruption 07/30/2016  . Prediabetes 02/24/2015  . Dyspnea on exertion 09/29/2014  . ILD (interstitial lung disease) ([place]) 12/11/2012  . Barrett's esophagus 12/11/2012  . RLS (restless legs syndrome) 04/13/2011  . 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 ARTHRALGIA 09/01/2008  . HIATAL HERNIA 07/31/2007  . History of cardiovascular disorder 07/31/2007  . Allergic rhinitis 06/19/2007  . GASTROESOPHAGEAL REFLUX DISEASE, CHRONIC 06/19/2007  . Hypothyroidism 02/07/2007  . HYPERLIPIDEMIA 02/07/2007    Current Outpatient Medications on File Prior to Visit  Medication Sig Dispense Refill  . aspirin EC 81 MG tablet Take 1 tablet (81 mg total) by mouth daily. 90 tablet 3  . Biotin 5000 MCG TABS Take 5,000 mcg by mouth daily.    . camphor-menthol (SARNA) lotion Apply 1 application topically as needed for itching.    . Carboxymethylcellul-Glycerin (REFRESH RELIEVA OP) Place 1 drop into both eyes every 6 (six) hours as needed (dry eyes).    . cetirizine (ZYRTEC) 10 MG tablet Take 10 mg by mouth 2 (two) times daily.    . cholecalciferol (VITAMIN D3) 25 MCG (1000 UT) tablet Take 1,000 Units by mouth daily.    .  clobetasol (TEMOVATE) 0.05 % external solution SMARTSIG:1 Milliliter(s) Topical Twice Daily    . diphenhydrAMINE (BENADRYL) 25 MG tablet as needed.    Marland Kitchen esomeprazole (NEXIUM) 20 MG capsule Take 1 capsule (20 mg total) by mouth daily at 12 noon. (Patient taking differently: Take 20 mg by mouth daily as needed (heartburn). )    . fluticasone (FLONASE) 50 MCG/ACT nasal spray Place 1 spray into both nostrils daily as needed for allergies or rhinitis.    . hydroxychloroquine (PLAQUENIL) 200 MG tablet Take 200 mg by mouth 2 (two) times daily.    . hydrOXYzine (ATARAX/VISTARIL) 25 MG tablet Take 25 mg by mouth 3 (three) times daily.    . magnesium oxide (MAG-OX) 400 MG tablet Take 400 mg by mouth daily.    . Multiple Vitamins-Minerals (ZINC PO) Take 1 tablet by mouth.    . naproxen sodium (ALEVE) 220 MG tablet Take 220 mg by mouth as needed.    . ondansetron (ZOFRAN ODT) 4 MG disintegrating tablet Take 1 tablet (4 mg total) by mouth every 8 (eight) hours as needed for nausea or vomiting. 20 tablet 0  . Pirfenidone (ESBRIET) 267 MG CAPS Take 3 capsules by mouth 3 (three) times daily as needed.     . pravastatin (PRAVACHOL) 40 MG tablet TAKE 1 TABLET BY MOUTH  DAILY (Patient taking differently: Take 40 mg by mouth at bedtime. ) 90 tablet 2  . predniSONE (  DELTASONE) 10 MG tablet Take 10 mg by mouth daily with breakfast. Finish on 04/20/2019    . rOPINIRole (REQUIP) 0.5 MG tablet TAKE 2 TABLETS BY MOUTH  AFTER DINNER EACH NIGHT. (Patient taking differently: Take 0.5-1 mg by mouth at bedtime. Dose depends on severity of restless legs) 180 tablet 2  . SYNTHROID 75 MCG tablet TAKE 1 TABLET BY MOUTH  DAILY BEFORE BREAKFAST 90 tablet 3  . triamcinolone cream (KENALOG) 0.1 % APPLY TO AFFECTED AREA TWICE DAILY AS NEEDED FOR ITCHING    . fexofenadine (ALLEGRA) 180 MG tablet Take 180 mg by mouth daily. (Patient not taking: Reported on 07/06/2019)    . [DISCONTINUED] loratadine (CLARITIN) 10 MG tablet Take 10 mg by mouth  daily as needed.      . [DISCONTINUED] omeprazole (PRILOSEC) 40 MG capsule Take 1 capsule (40 mg total) by mouth daily. 90 capsule 0   No current facility-administered medications on file prior to visit.    Past Medical History:  Diagnosis Date  . Allergic rhinitis   . GERD (gastroesophageal reflux disease)   . Glaucoma    SUSPECT  . Hiatal hernia   . Hyperlipidemia    LDL goal = < 100  . Hypothyroidism   . ILD (interstitial lung disease) ([place])   . Lichen planopilaris   . MVP (mitral valve prolapse)    documented on 2 D ECHO  . Restless legs   . Urticaria     Past Surgical History:  Procedure Laterality Date  . APPENDECTOMY     with TAH  . CARDIAC CATHETERIZATION  1989   negative  . COLONOSCOPY  2005   Dr Olevia Perches  . COLONOSCOPY  01/2013   Dr Collene Mares  . esophageal dilation  07/04/2011   Dr Olevia Perches  . FLEXIBLE SIGMOIDOSCOPY  1999    Dr Olevia Perches  . KNEE ARTHROSCOPY  2001, 2005   Dr Percell Miller   bilat  . TOTAL ABDOMINAL HYSTERECTOMY     for Endometriosis; no BSO, Dr Colin Ina  . UPPER GI ENDOSCOPY  01/2013   Dr Collene Mares  . VIDEO BRONCHOSCOPY Bilateral 02/05/2019   Procedure: VIDEO BRONCHOSCOPY WITH FLUORO;  Surgeon: Brand Males, MD;  Location: [facility];  Service: Endoscopy;  Laterality: Bilateral;    Social History   Socioeconomic History  . Marital status: Married    Spouse name: Jeneen Rinks  . Number of children: 2  . Years of education: Not on file  . Highest education level: Not on file  Occupational History  . Occupation: retired from [employer] retired  Tobacco Use  . Smoking status: Never Smoker  . Smokeless tobacco: Never Used  Vaping Use  . Vaping Use: Never used  Substance and Sexual Activity  . Alcohol use: Yes    Comment:  rarely  . Drug use: No  . Sexual activity: Yes  Other Topics Concern  . Not on file  Social History Narrative  . Not on file   Social Determinants of Health   Financial Resource Strain:   . Difficulty of Paying Living  Expenses:   Food Insecurity:   . Worried About Charity fundraiser in the Last Year:   . Arboriculturist in the Last Year:   Transportation Needs:   . Film/video editor (Medical):   Marland Kitchen Lack of Transportation (Non-Medical):   Physical Activity:   . Days of Exercise per Week:   . Minutes of Exercise per Session:   Stress:   . Feeling of Stress :  Social Connections:   . Frequency of Communication with Friends and Family:   . Frequency of Social Gatherings with Friends and Family:   . Attends Religious Services:   . Active Member of Clubs or Organizations:   . Attends Archivist Meetings:   Marland Kitchen Marital Status:     Family History  Problem Relation Age of Onset  . Lung cancer Father        Asbestos with mesothelioma  . Heart disease Paternal Grandmother        ? etiology  . Diabetes Paternal Grandmother   . Diabetes Brother   . Uterine cancer Paternal Aunt   . Stroke Paternal Grandfather        > 44  . Heart attack Brother 32  . Allergies Sister   . Aneurysm Mother        congenital  . Stroke Brother        two strokes  . Barrett's esophagus Brother     Review of Systems  Constitutional: Negative for chills and fever.       Body temp varies  Eyes: Negative for visual disturbance.  Respiratory: Positive for shortness of breath (with hills and stairs). Negative for cough and wheezing.        Chest heaviness with walking at times - when she knows she is not breathing correctly  Cardiovascular: Negative for chest pain, palpitations and leg swelling.  Gastrointestinal: Positive for abdominal distention and nausea (occ). Negative for abdominal pain, blood in stool, constipation and diarrhea.       GERD  Genitourinary: Negative for dysuria.  Musculoskeletal: Positive for arthralgias.  Skin: Negative for rash.  Neurological: Positive for dizziness (occ). Negative for light-headedness and headaches.  Psychiatric/Behavioral: Negative for dysphoric mood. The patient  is not nervous/anxious.        Objective:   Vitals:   07/06/19 0947  BP: 130/78  Pulse: 68  Temp: 98.2 F (36.8 C)  SpO2: 99%   Filed Weights   07/06/19 0947  Weight: 148 lb (67.1 kg)   Body mass index is 25.01 kg/m.  BP Readings from Last 3 Encounters:  07/06/19 130/78  04/30/19 138/78  03/16/19 124/70    Wt Readings from Last 3 Encounters:  07/06/19 148 lb (67.1 kg)  04/30/19 149 lb 12.8 oz (67.9 kg)  03/16/19 151 lb (68.5 kg)     Physical Exam Constitutional: She appears well-developed and well-nourished. No distress.  HENT:  Head: Normocephalic and atraumatic.  Right Ear: External ear normal. Normal ear canal and TM Left Ear: External ear normal.  Normal ear canal and TM Mouth/Throat: Oropharynx is clear and moist.  Eyes: Conjunctivae and EOM are normal.  Neck: Neck supple. No tracheal deviation present. No thyromegaly present.  No carotid bruit  Cardiovascular: Normal rate, regular rhythm and normal heart sounds.   No murmur heard.  No edema. Pulmonary/Chest: Effort normal and breath sounds normal. No respiratory distress. She has no wheezes. She has no rales.  Breast: deferred   Abdominal: Soft. She exhibits no distension. There is no tenderness.  Lymphadenopathy: She has no cervical adenopathy.  Skin: Skin is warm and dry. She is not diaphoretic.  Psychiatric: She has a normal mood and affect. Her behavior is normal.        Assessment & Plan:   Physical exam: Screening blood work    ordered Immunizations  Up to date  Colonoscopy  N/a due to age 66  Up to date  Gyn  N/a  Dexa    Up to date  Eye exams  Up to date  Exercise   walking Weight   Normal BMI Substance abuse   none  See Problem List for Assessment and Plan of chronic medical problems.   This visit occurred during the SARS-CoV-2 public health emergency.  Safety protocols were in place, including screening questions prior to the visit, additional usage of staff PPE, and  extensive cleaning of exam room while observing appropriate contact time as indicated for disinfecting solutions.

## 2019-07-05 NOTE — Patient Instructions (Addendum)
Blood work was ordered.    All other Health Maintenance issues reviewed.   All recommended immunizations and age-appropriate screenings are up-to-date or discussed.  No immunization administered today.   Medications reviewed and updated.  Changes include :   pepcid nightly     Please followup in 1year     Health Maintenance, Female Adopting a healthy lifestyle and getting preventive care are important in promoting health and wellness. Ask your health care provider about:  The right schedule for you to have regular tests and exams.  Things you can do on your own to prevent diseases and keep yourself healthy. What should I know about diet, weight, and exercise? Eat a healthy diet   Eat a diet that includes plenty of vegetables, fruits, low-fat dairy products, and lean protein.  Do not eat a lot of foods that are high in solid fats, added sugars, or sodium. Maintain a healthy weight Body mass index (BMI) is used to identify weight problems. It estimates body fat based on height and weight. Your health care provider can help determine your BMI and help you achieve or maintain a healthy weight. Get regular exercise Get regular exercise. This is one of the most important things you can do for your health. Most adults should:  Exercise for at least 150 minutes each week. The exercise should increase your heart rate and make you sweat (moderate-intensity exercise).  Do strengthening exercises at least twice a week. This is in addition to the moderate-intensity exercise.  Spend less time sitting. Even light physical activity can be beneficial. Watch cholesterol and blood lipids Have your blood tested for lipids and cholesterol at 78 years of age, then have this test every 5 years. Have your cholesterol levels checked more often if:  Your lipid or cholesterol levels are high.  You are older than 78 years of age.  You are at high risk for heart disease. What should I know  about cancer screening? Depending on your health history and family history, you may need to have cancer screening at various ages. This may include screening for:  Breast cancer.  Cervical cancer.  Colorectal cancer.  Skin cancer.  Lung cancer. What should I know about heart disease, diabetes, and high blood pressure? Blood pressure and heart disease  High blood pressure causes heart disease and increases the risk of stroke. This is more likely to develop in people who have high blood pressure readings, are of African descent, or are overweight.  Have your blood pressure checked: ? Every 3-5 years if you are 63-75 years of age. ? Every year if you are 68 years old or older. Diabetes Have regular diabetes screenings. This checks your fasting blood sugar level. Have the screening done:  Once every three years after age 76 if you are at a normal weight and have a low risk for diabetes.  More often and at a younger age if you are overweight or have a high risk for diabetes. What should I know about preventing infection? Hepatitis B If you have a higher risk for hepatitis B, you should be screened for this virus. Talk with your health care provider to find out if you are at risk for hepatitis B infection. Hepatitis C Testing is recommended for:  Everyone born from [birth year] through 1964.  Anyone with known risk factors for hepatitis C. Sexually transmitted infections (STIs)  Get screened for STIs, including gonorrhea and chlamydia, if: ? You are sexually active and are younger than  78 years of age. ? You are older than 77 years of age and your health care provider tells you that you are at risk for this type of infection. ? Your sexual activity has changed since you were last screened, and you are at increased risk for chlamydia or gonorrhea. Ask your health care provider if you are at risk.  Ask your health care provider about whether you are at high risk for HIV. Your health care  provider may recommend a prescription medicine to help prevent HIV infection. If you choose to take medicine to prevent HIV, you should first get tested for HIV. You should then be tested every 3 months for as long as you are taking the medicine. Pregnancy  If you are about to stop having your period (premenopausal) and you may become pregnant, seek counseling before you get pregnant.  Take 400 to 800 micrograms (mcg) of folic acid every day if you become pregnant.  Ask for birth control (contraception) if you want to prevent pregnancy. Osteoporosis and menopause Osteoporosis is a disease in which the bones lose minerals and strength with aging. This can result in bone fractures. If you are 1 years old or older, or if you are at risk for osteoporosis and fractures, ask your health care provider if you should:  Be screened for bone loss.  Take a calcium or vitamin D supplement to lower your risk of fractures.  Be given hormone replacement therapy (HRT) to treat symptoms of menopause. Follow these instructions at home: Lifestyle  Do not use any products that contain nicotine or tobacco, such as cigarettes, e-cigarettes, and chewing tobacco. If you need help quitting, ask your health care provider.  Do not use street drugs.  Do not share needles.  Ask your health care provider for help if you need support or information about quitting drugs. Alcohol use  Do not drink alcohol if: ? Your health care provider tells you not to drink. ? You are pregnant, may be pregnant, or are planning to become pregnant.  If you drink alcohol: ? Limit how much you use to 0-1 drink a day. ? Limit intake if you are breastfeeding.  Be aware of how much alcohol is in your drink. In the [place] one drink equals one 12 oz bottle of beer (355 mL), one 5 oz glass of wine (148 mL), or one 1 oz glass of hard liquor (44 mL). General instructions  Schedule regular health, dental, and eye exams.  Stay current  with your vaccines.  Tell your health care provider if: ? You often feel depressed. ? You have ever been abused or do not feel safe at home. Summary  Adopting a healthy lifestyle and getting preventive care are important in promoting health and wellness.  Follow your health care provider's instructions about healthy diet, exercising, and getting tested or screened for diseases.  Follow your health care provider's instructions on monitoring your cholesterol and blood pressure. This information is not intended to replace advice given to you by your health care provider. Make sure you discuss any questions you have with your health care provider. Document Revised: 01/01/2018 Document Reviewed: 01/01/2018 Elsevier Patient Education  2020 [employer].

## 2019-07-06 ENCOUNTER — Other Ambulatory Visit: Payer: Self-pay

## 2019-07-06 ENCOUNTER — Encounter: Payer: Self-pay | Admitting: Internal Medicine

## 2019-07-06 ENCOUNTER — Ambulatory Visit: Payer: Medicare Other

## 2019-07-06 ENCOUNTER — Ambulatory Visit (INDEPENDENT_AMBULATORY_CARE_PROVIDER_SITE_OTHER): Payer: Medicare Other | Admitting: Internal Medicine

## 2019-07-06 VITALS — BP 130/78 | HR 68 | Temp 98.2°F | Ht 64.5 in | Wt 148.0 lb

## 2019-07-06 DIAGNOSIS — R7303 Prediabetes: Secondary | ICD-10-CM | POA: Diagnosis not present

## 2019-07-06 DIAGNOSIS — L661 Lichen planopilaris: Secondary | ICD-10-CM | POA: Insufficient documentation

## 2019-07-06 DIAGNOSIS — E782 Mixed hyperlipidemia: Secondary | ICD-10-CM

## 2019-07-06 DIAGNOSIS — Z0000 Encounter for general adult medical examination without abnormal findings: Secondary | ICD-10-CM

## 2019-07-06 DIAGNOSIS — K219 Gastro-esophageal reflux disease without esophagitis: Secondary | ICD-10-CM | POA: Diagnosis not present

## 2019-07-06 DIAGNOSIS — E038 Other specified hypothyroidism: Secondary | ICD-10-CM | POA: Diagnosis not present

## 2019-07-06 DIAGNOSIS — L439 Lichen planus, unspecified: Secondary | ICD-10-CM

## 2019-07-06 DIAGNOSIS — G2581 Restless legs syndrome: Secondary | ICD-10-CM

## 2019-07-06 LAB — LIPID PANEL
Cholesterol: 168 mg/dL (ref 0–200)
HDL: 49.4 mg/dL
LDL Cholesterol: 85 mg/dL (ref 0–99)
NonHDL: 118.31
Total CHOL/HDL Ratio: 3
Triglycerides: 165 mg/dL — ABNORMAL HIGH (ref 0.0–149.0)
VLDL: 33 mg/dL (ref 0.0–40.0)

## 2019-07-06 LAB — CBC WITH DIFFERENTIAL/PLATELET
Basophils Absolute: 0.1 10*3/uL (ref 0.0–0.1)
Basophils Relative: 1 % (ref 0.0–3.0)
Eosinophils Absolute: 0.3 10*3/uL (ref 0.0–0.7)
Eosinophils Relative: 3.2 % (ref 0.0–5.0)
HCT: 37.5 % (ref 36.0–46.0)
Hemoglobin: 12.4 g/dL (ref 12.0–15.0)
Lymphocytes Relative: 24.2 % (ref 12.0–46.0)
Lymphs Abs: 2 10*3/uL (ref 0.7–4.0)
MCHC: 33.1 g/dL (ref 30.0–36.0)
MCV: 86.5 fl (ref 78.0–100.0)
Monocytes Absolute: 0.6 10*3/uL (ref 0.1–1.0)
Monocytes Relative: 7.6 % (ref 3.0–12.0)
Neutro Abs: 5.3 10*3/uL (ref 1.4–7.7)
Neutrophils Relative %: 64 % (ref 43.0–77.0)
Platelets: 236 10*3/uL (ref 150.0–400.0)
RBC: 4.33 Mil/uL (ref 3.87–5.11)
RDW: 16.6 % — ABNORMAL HIGH (ref 11.5–15.5)
WBC: 8.2 10*3/uL (ref 4.0–10.5)

## 2019-07-06 LAB — BASIC METABOLIC PANEL WITH GFR
BUN: 12 mg/dL (ref 6–23)
CO2: 28 meq/L (ref 19–32)
Calcium: 9.6 mg/dL (ref 8.4–10.5)
Chloride: 104 meq/L (ref 96–112)
Creatinine, Ser: 0.79 mg/dL (ref 0.40–1.20)
GFR: 70.35 mL/min
Glucose, Bld: 84 mg/dL (ref 70–99)
Potassium: 4.2 meq/L (ref 3.5–5.1)
Sodium: 138 meq/L (ref 135–145)

## 2019-07-06 LAB — HEMOGLOBIN A1C: Hgb A1c MFr Bld: 6.1 % (ref 4.6–6.5)

## 2019-07-06 LAB — TSH: TSH: 10.19 u[IU]/mL — ABNORMAL HIGH (ref 0.35–4.50)

## 2019-07-06 MED ORDER — FAMOTIDINE 40 MG PO TABS
40.0000 mg | ORAL_TABLET | Freq: Every day | ORAL | 1 refills | Status: DC
Start: 2019-07-06 — End: 2023-03-14

## 2019-07-06 NOTE — Assessment & Plan Note (Signed)
Chronic  Clinically euthyroid Check tsh  Titrate med dose if needed

## 2019-07-06 NOTE — Assessment & Plan Note (Signed)
Chronic Doing well with requip Continue current dose

## 2019-07-06 NOTE — Assessment & Plan Note (Signed)
Chronic Check lipid panel  Continue daily statin Regular exercise and healthy diet encouraged

## 2019-07-06 NOTE — Assessment & Plan Note (Signed)
Chronic Check a1c Low sugar / carb diet Stressed regular exercise

## 2019-07-06 NOTE — Assessment & Plan Note (Addendum)
Chronic Taking 2 nexium daily Still has some GERD - not ideally controlled Add pepcid 40 mg in evening If symptoms not improved advised to see Dr Collene Mares

## 2019-07-07 ENCOUNTER — Other Ambulatory Visit: Payer: Self-pay | Admitting: Internal Medicine

## 2019-07-07 DIAGNOSIS — E039 Hypothyroidism, unspecified: Secondary | ICD-10-CM

## 2019-07-07 MED ORDER — LEVOTHYROXINE SODIUM 88 MCG PO TABS
88.0000 ug | ORAL_TABLET | Freq: Every day | ORAL | 3 refills | Status: DC
Start: 2019-07-07 — End: 2019-09-04

## 2019-07-09 ENCOUNTER — Other Ambulatory Visit: Payer: Self-pay

## 2019-07-09 ENCOUNTER — Ambulatory Visit (INDEPENDENT_AMBULATORY_CARE_PROVIDER_SITE_OTHER): Payer: Medicare Other

## 2019-07-09 VITALS — BP 110/70 | HR 98 | Temp 98.1°F | Resp 16 | Ht 65.0 in | Wt 151.8 lb

## 2019-07-09 DIAGNOSIS — Z0000 Encounter for general adult medical examination without abnormal findings: Secondary | ICD-10-CM

## 2019-07-09 NOTE — Patient Instructions (Signed)
[name] , Thank you for taking time to come for your Medicare Wellness Visit. I appreciate your ongoing commitment to your health goals. Please review the following plan we discussed and let me know if I can assist you in the future.   Screening recommendations/referrals: Colonoscopy: 05/03/2013; due every 10 years (2025) Mammogram: 07/16/2018; due every year Bone Density: 11/05/2016; due 2023 Recommended yearly ophthalmology/optometry visit for glaucoma screening and checkup Recommended yearly dental visit for hygiene and checkup  Vaccinations: Influenza vaccine: 09/11/2018 Pneumococcal vaccine: completed Tdap vaccine: 02/11/2012; due 2024 Shingles vaccine: completed   Covid-19: completed  Advanced directives: Please bring a copy of your health care power of attorney and living will to the office at your convenience.  Conditions/risks identified: Please continue to do your personal lifestyle choices by: daily care of teeth and gums, regular physical activity (goal should be 5 days a week for 30 minutes), eat a healthy diet, avoid tobacco and drug use, limiting any alcohol intake, taking a low-dose aspirin (if not allergic or have been advised by your provider otherwise) and taking vitamins and minerals as recommended by your provider.  Next appointment: Please schedule your next Medicare Wellness Visit with your Nurse Health Advisor in 1 year.   Preventive Care 69 Years and Older, Female Preventive care refers to lifestyle choices and visits with your health care provider that can promote health and wellness. What does preventive care include?  A yearly physical exam. This is also called an annual well check.  Dental exams once or twice a year.  Routine eye exams. Ask your health care provider how often you should have your eyes checked.  Personal lifestyle choices, including:  Daily care of your teeth and gums.  Regular physical activity.  Eating a healthy diet.  Avoiding  tobacco and drug use.  Limiting alcohol use.  Practicing safe sex.  Taking low-dose aspirin every day.  Taking vitamin and mineral supplements as recommended by your health care provider. What happens during an annual well check? The services and screenings done by your health care provider during your annual well check will depend on your age, overall health, lifestyle risk factors, and family history of disease. Counseling  Your health care provider may ask you questions about your:  Alcohol use.  Tobacco use.  Drug use.  Emotional well-being.  Home and relationship well-being.  Sexual activity.  Eating habits.  History of falls.  Memory and ability to understand (cognition).  Work and work Statistician.  Reproductive health. Screening  You may have the following tests or measurements:  Height, weight, and BMI.  Blood pressure.  Lipid and cholesterol levels. These may be checked every 5 years, or more frequently if you are over 64 years old.  Skin check.  Lung cancer screening. You may have this screening every year starting at age 51 if you have a 30-pack-year history of smoking and currently smoke or have quit within the past 15 years.  Fecal occult blood test (FOBT) of the stool. You may have this test every year starting at age 66.  Flexible sigmoidoscopy or colonoscopy. You may have a sigmoidoscopy every 5 years or a colonoscopy every 10 years starting at age 15.  Hepatitis C blood test.  Hepatitis B blood test.  Sexually transmitted disease (STD) testing.  Diabetes screening. This is done by checking your blood sugar (glucose) after you have not eaten for a while (fasting). You may have this done every 1-3 years.  Bone density scan. This is  done to screen for osteoporosis. You may have this done starting at age 57.  Mammogram. This may be done every 1-2 years. Talk to your health care provider about how often you should have regular  mammograms. Talk with your health care provider about your test results, treatment options, and if necessary, the need for more tests. Vaccines  Your health care provider may recommend certain vaccines, such as:  Influenza vaccine. This is recommended every year.  Tetanus, diphtheria, and acellular pertussis (Tdap, Td) vaccine. You may need a Td booster every 10 years.  Zoster vaccine. You may need this after age 7.  Pneumococcal 13-valent conjugate (PCV13) vaccine. One dose is recommended after age 64.  Pneumococcal polysaccharide (PPSV23) vaccine. One dose is recommended after age 2. Talk to your health care provider about which screenings and vaccines you need and how often you need them. This information is not intended to replace advice given to you by your health care provider. Make sure you discuss any questions you have with your health care provider. Document Released: 02/04/2015 Document Revised: 09/28/2015 Document Reviewed: 11/09/2014 Elsevier Interactive Patient Education  2017 [place] Prevention in the Home Falls can cause injuries. They can happen to people of all ages. There are many things you can do to make your home safe and to help prevent falls. What can I do on the outside of my home?  Regularly fix the edges of walkways and driveways and fix any cracks.  Remove anything that might make you trip as you walk through a door, such as a raised step or threshold.  Trim any bushes or trees on the path to your home.  Use bright outdoor lighting.  Clear any walking paths of anything that might make someone trip, such as rocks or tools.  Regularly check to see if handrails are loose or broken. Make sure that both sides of any steps have handrails.  Any raised decks and porches should have guardrails on the edges.  Have any leaves, snow, or ice cleared regularly.  Use sand or salt on walking paths during winter.  Clean up any spills in your garage  right away. This includes oil or grease spills. What can I do in the bathroom?  Use night lights.  Install grab bars by the toilet and in the tub and shower. Do not use towel bars as grab bars.  Use non-skid mats or decals in the tub or shower.  If you need to sit down in the shower, use a plastic, non-slip stool.  Keep the floor dry. Clean up any water that spills on the floor as soon as it happens.  Remove soap buildup in the tub or shower regularly.  Attach bath mats securely with double-sided non-slip rug tape.  Do not have throw rugs and other things on the floor that can make you trip. What can I do in the bedroom?  Use night lights.  Make sure that you have a light by your bed that is easy to reach.  Do not use any sheets or blankets that are too big for your bed. They should not hang down onto the floor.  Have a firm chair that has side arms. You can use this for support while you get dressed.  Do not have throw rugs and other things on the floor that can make you trip. What can I do in the kitchen?  Clean up any spills right away.  Avoid walking on wet floors.  Keep  items that you use a lot in easy-to-reach places.  If you need to reach something above you, use a strong step stool that has a grab bar.  Keep electrical cords out of the way.  Do not use floor polish or wax that makes floors slippery. If you must use wax, use non-skid floor wax.  Do not have throw rugs and other things on the floor that can make you trip. What can I do with my stairs?  Do not leave any items on the stairs.  Make sure that there are handrails on both sides of the stairs and use them. Fix handrails that are broken or loose. Make sure that handrails are as long as the stairways.  Check any carpeting to make sure that it is firmly attached to the stairs. Fix any carpet that is loose or worn.  Avoid having throw rugs at the top or bottom of the stairs. If you do have throw rugs,  attach them to the floor with carpet tape.  Make sure that you have a light switch at the top of the stairs and the bottom of the stairs. If you do not have them, ask someone to add them for you. What else can I do to help prevent falls?  Wear shoes that:  Do not have high heels.  Have rubber bottoms.  Are comfortable and fit you well.  Are closed at the toe. Do not wear sandals.  If you use a stepladder:  Make sure that it is fully opened. Do not climb a closed stepladder.  Make sure that both sides of the stepladder are locked into place.  Ask someone to hold it for you, if possible.  Clearly mark and make sure that you can see:  Any grab bars or handrails.  First and last steps.  Where the edge of each step is.  Use tools that help you move around (mobility aids) if they are needed. These include:  Canes.  Walkers.  Scooters.  Crutches.  Turn on the lights when you go into a dark area. Replace any light bulbs as soon as they burn out.  Set up your furniture so you have a clear path. Avoid moving your furniture around.  If any of your floors are uneven, fix them.  If there are any pets around you, be aware of where they are.  Review your medicines with your doctor. Some medicines can make you feel dizzy. This can increase your chance of falling. Ask your doctor what other things that you can do to help prevent falls. This information is not intended to replace advice given to you by your health care provider. Make sure you discuss any questions you have with your health care provider. Document Released: 11/04/2008 Document Revised: 06/16/2015 Document Reviewed: 02/12/2014 Elsevier Interactive Patient Education  2017 [employer].

## 2019-07-09 NOTE — Progress Notes (Signed)
Subjective:   Elizabeth Espinoza is a 79 y.o. female who presents for Medicare Annual (Subsequent) preventive examination.  Review of Systems:  No ROS. Medicare Wellness Visit Cardiac Risk Factors include: advanced age (>77mn, >>39women);family history of premature cardiovascular disease     Objective:     Vitals: BP 110/70 (BP Location: Left Arm, Patient Position: Sitting, Cuff Size: Normal)   Pulse 98   Temp 98.1 F (36.7 C)   Resp 16   Ht _0  (1.651 m)   Wt 151 lb 12.8 oz (68.9 kg)   SpO2 99%   BMI 25.26 kg/m   Body mass index is 25.26 kg/m.  Advanced Directives 07/09/2019 02/05/2019 11/12/2018 07/03/2018 07/02/2017 06/14/2016  Does Patient Have a Medical Advance Directive? Yes Yes No;Yes Yes Yes Yes  Type of Advance Directive - [place]  Does patient want to make changes to medical advance directive? No - Patient declined - - - - -  Copy of HPress photographerin Chart? - - No - copy requested No - copy requested No - copy requested No - copy requested    Tobacco Social History   Tobacco Use  Smoking Status Never Smoker  Smokeless Tobacco Never Used     Counseling given: No   Clinical Intake:  Pre-visit preparation completed: Yes  Pain : No/denies pain Pain Score: 0-No pain     Nutritional Risks: Nausea/ vomitting/ diarrhea (due to medication) Diabetes: No  How often do you need to have someone help you when you read instructions, pamphlets, or other written materials from your doctor or pharmacy?: 1 - Never What is the last grade level you completed in school?: High School Graduate  Interpreter Needed?: No  Information entered by :: Juell Radney N. HLowell Guitar LPN  Past Medical History:  Diagnosis Date  . Allergic rhinitis   . GERD (gastroesophageal reflux disease)   . Glaucoma     SUSPECT  . Hiatal hernia   . Hyperlipidemia    LDL goal = < 100  . Hypothyroidism   . [place]   . Lichen planopilaris   . MVP (mitral valve prolapse)    documented on 2 D ECHO  . Restless legs   . Urticaria    Past Surgical History:  Procedure Laterality Date  . APPENDECTOMY     with TAH  . CARDIAC CATHETERIZATION  1989   negative  . COLONOSCOPY  2005   Dr BOlevia Perches . COLONOSCOPY  01/2013   Dr mCollene Mares . esophageal dilation  07/04/2011   Dr BOlevia Perches . FLEXIBLE SIGMOIDOSCOPY  1999    Dr BOlevia Perches . KNEE ARTHROSCOPY  2001, 2005   Dr MPercell Miller  bilat  . TOTAL ABDOMINAL HYSTERECTOMY     for Endometriosis; no BSO, Dr HColin Ina . UPPER GI ENDOSCOPY  01/2013   Dr MCollene Mares . VIDEO BRONCHOSCOPY Bilateral 02/05/2019   Procedure: VIDEO BRONCHOSCOPY WITH FLUORO;  Surgeon: RBrand Males MD;  [facility]  Service: Endoscopy;  Laterality: Bilateral;   Family History  Problem Relation Age of Onset  . Lung cancer Father        Asbestos with mesothelioma  . Heart disease Paternal Grandmother        ? etiology  . Diabetes Paternal Grandmother   . Diabetes Brother   . Uterine cancer Paternal Aunt   .  Stroke Paternal Grandfather        > 29  . Heart attack Brother 26  . Allergies Sister   . Aneurysm Mother        congenital  . Stroke Brother        two strokes  . Barrett's esophagus Brother    Social History   Socioeconomic History  . Marital status: Married    Spouse name: Jeneen Rinks  . Number of children: 2  . Years of education: Not on file  . Highest education level: Not on file  Occupational History  . Occupation: retired from [employer] retired  Tobacco Use  . Smoking status: Never Smoker  . Smokeless tobacco: Never Used  Vaping Use  . Vaping Use: Never used  Substance and Sexual Activity  . Alcohol use: Yes    Comment:  rarely  . Drug use: No  . Sexual activity: Yes  Other Topics Concern  . Not on file  Social History  Narrative  . Not on file   Social Determinants of Health   Financial Resource Strain:   . Difficulty of Paying Living Expenses:   Food Insecurity:   . Worried About Charity fundraiser in the Last Year:   . Arboriculturist in the Last Year:   Transportation Needs:   . Film/video editor (Medical):   Marland Kitchen Lack of Transportation (Non-Medical):   Physical Activity:   . Days of Exercise per Week:   . Minutes of Exercise per Session:   Stress:   . Feeling of Stress :   Social Connections:   . Frequency of Communication with Friends and Family:   . Frequency of Social Gatherings with Friends and Family:   . Attends Religious Services:   . Active Member of Clubs or Organizations:   . Attends Archivist Meetings:   Marland Kitchen Marital Status:     Outpatient Encounter Medications as of 07/09/2019  Medication Sig  . aspirin EC 81 MG tablet Take 1 tablet (81 mg total) by mouth daily.  . Biotin 5000 MCG TABS Take 5,000 mcg by mouth daily.  . camphor-menthol (SARNA) lotion Apply 1 application topically as needed for itching.  . Carboxymethylcellul-Glycerin (REFRESH RELIEVA OP) Place 1 drop into both eyes every 6 (six) hours as needed (dry eyes).  . cetirizine (ZYRTEC) 10 MG tablet Take 10 mg by mouth 2 (two) times daily.  . cholecalciferol (VITAMIN D3) 25 MCG (1000 UT) tablet Take 1,000 Units by mouth daily.  . clobetasol (TEMOVATE) 0.05 % external solution SMARTSIG:1 Milliliter(s) Topical Twice Daily  . diphenhydrAMINE (BENADRYL) 25 MG tablet as needed.  Marland Kitchen esomeprazole (NEXIUM) 20 MG capsule Take 1 capsule (20 mg total) by mouth daily at 12 noon. (Patient taking differently: Take 20 mg by mouth daily as needed (heartburn). )  . famotidine (PEPCID) 40 MG tablet Take 1 tablet (40 mg total) by mouth daily.  . fexofenadine (ALLEGRA) 180 MG tablet Take 180 mg by mouth daily. (Patient not taking: Reported on 07/06/2019)  . fluticasone (FLONASE) 50 MCG/ACT nasal spray Place 1 spray into both  nostrils daily as needed for allergies or rhinitis.  . hydroxychloroquine (PLAQUENIL) 200 MG tablet Take 200 mg by mouth 2 (two) times daily.  . hydrOXYzine (ATARAX/VISTARIL) 25 MG tablet Take 25 mg by mouth 3 (three) times daily.  Marland Kitchen levothyroxine (SYNTHROID) 88 MCG tablet Take 1 tablet (88 mcg total) by mouth daily.  . magnesium oxide (MAG-OX) 400 MG tablet Take 400 mg by  mouth daily.  . Multiple Vitamins-Minerals (ZINC PO) Take 1 tablet by mouth.  . naproxen sodium (ALEVE) 220 MG tablet Take 220 mg by mouth as needed.  . ondansetron (ZOFRAN ODT) 4 MG disintegrating tablet Take 1 tablet (4 mg total) by mouth every 8 (eight) hours as needed for nausea or vomiting.  . Pirfenidone (ESBRIET) 267 MG CAPS Take 3 capsules by mouth 3 (three) times daily as needed.   . pravastatin (PRAVACHOL) 40 MG tablet TAKE 1 TABLET BY MOUTH  DAILY (Patient taking differently: Take 40 mg by mouth at bedtime. )  . predniSONE (DELTASONE) 10 MG tablet Take 10 mg by mouth daily with breakfast. Finish on 04/20/2019  . rOPINIRole (REQUIP) 0.5 MG tablet TAKE 2 TABLETS BY MOUTH  AFTER DINNER EACH NIGHT. (Patient taking differently: Take 0.5-1 mg by mouth at bedtime. Dose depends on severity of restless legs)  . triamcinolone cream (KENALOG) 0.1 % APPLY TO AFFECTED AREA TWICE DAILY AS NEEDED FOR ITCHING  . [DISCONTINUED] loratadine (CLARITIN) 10 MG tablet Take 10 mg by mouth daily as needed.    . [DISCONTINUED] omeprazole (PRILOSEC) 40 MG capsule Take 1 capsule (40 mg total) by mouth daily.   No facility-administered encounter medications on file as of 07/09/2019.    Activities of Daily Living In your present state of health, do you have any difficulty performing the following activities: 07/09/2019  Hearing? N  Vision? N  Difficulty concentrating or making decisions? N  Walking or climbing stairs? N  Dressing or bathing? N  Doing errands, shopping? N  Preparing Food and eating ? N  Using the Toilet? N  In the past six  months, have you accidently leaked urine? N  Do you have problems with loss of bowel control? N  Managing your Medications? N  Managing your Finances? N  Housekeeping or managing your Housekeeping? N  Some recent data might be hidden    Patient Care Team: Binnie Rail, MD as PCP - General (Internal Medicine) Stanford Breed, Denice Bors, MD as Consulting Physician (Cardiology) Collene Gobble, MD as Consulting Physician (Pulmonary Disease) Charlton Haws, [facility] as Pharmacist (Pharmacist)    Assessment:   This is a routine wellness examination for Pamelia.  Exercise Activities and Dietary recommendations Current Exercise Habits: Structured exercise class;Home exercise routine, Type of exercise: walking;strength training/weights;Other - see comments (Stationary bike), Time (Minutes): 40, Frequency (Times/Week): 4, Weekly Exercise (Minutes/Week): 160, Intensity: Moderate, Exercise limited by: respiratory conditions(s)  Goals    .  Lose weight to reach goal of 145 pounds      Decrease the amount sweets, continue to exercise, eat healthy, enjoy life, and family    .  Patient Stated      Continue to be active by going to the gym, eat healthy, enjoy life, family and church    .  Patient Stated (pt-stated)      Continue to be more active by walking and exercising, which will help me breathe better.    Marland Kitchen  Pharmacy Care Plan      CARE PLAN ENTRY  Current Barriers:  . Chronic Disease Management support, education, and care coordination needs related to Hyperlipidemia, Coronary Artery Disease, GERD, and Interstitial Lung Disease   Hyperlipidemia/Coronary Artery Disease Lipid Panel  Component Value Date/Time   CHOL 168 07/04/2018 1148   TRIG 136.0 07/04/2018 1148   HDL 52.30 07/04/2018 1148   LDLCALC 88 07/04/2018 1148 .  Pharmacist Clinical Goal(s): o Over the next 180 days, patient will work  with PharmD and providers to maintain LDL goal < 100 . Current regimen:  o Pravastatin 40 mg at  bedtime o Aspirin 81 mg daily . Interventions: o Discussed cholesterol goals and benefits of medication for prevention of heart attack and stroke . Patient self care activities - Over the next 180 days, patient will: o Continue medications as prescribed o Continue low cholesterol diet  Predabetes Lab Results  Component Value Date/Time   HGBA1C 6.2 07/04/2018 11:48 AM   HGBA1C 6.0 07/02/2017 12:27 PM .  Pharmacist Clinical Goal(s): o Over the next 180 days, patient will work with PharmD and providers to maintain A1c goal <6.5% . Current regimen:  o No medications indicated . Interventions: o Discussed diabetes threshold is A1c > 6.5% o Discussed importance of diet and exercise to prevent progression to diabetes . Patient self care activities - Over the next 180 days, patient will: o Continue diet and exercise routine  Interstitial Lung Disease . Pharmacist Clinical Goal(s) o Over the next 180 days, patient will work with PharmD and providers to slow progression of lung disease . Current regimen:  o Pirfenidone (Esbriet) 267 mg- 3 tablets 3 times daily with food o Ondansetron ODT 4 mg every 8 hours as needed . Interventions: o Discussed nausea as known side effect of Esbriet o Counseled to use ondansetron if nausea interferes with ability to eat or drink . Patient self care activities - Over the next 90 days, patient will: o Continue medication as prescribed o Use ondansetron as described above  GERD . Pharmacist Clinical Goal(s) o Over the next 180 days, patient will work with PharmD and providers to reduce costs of antacid therapy . Current regimen:  o Esomeprazole 20 mg - 1-2 tablets once daily . Interventions: o Esomeprazole is Tier 3 with insurance plan ($47/month); will attempt tier exemption since other PPIs have failed to control symptoms . Patient self care activities - Over the next 180 days, patient will: o Continue medication as directed  Medication  management . Pharmacist Clinical Goal(s): o Over the next 180 days, patient will work with PharmD and providers to maintain optimal medication adherence . Current pharmacy: [facility] . Interventions o Comprehensive medication review performed. o Continue current medication management strategy . Patient self care activities - Over the next 180 days, patient will: o Focus on medication adherence by fill date o Take medications as prescribed o Report any questions or concerns to PharmD and/or provider(s)  Initial goal documentation       Fall Risk Fall Risk  07/09/2019 11/18/2018 07/04/2018 07/03/2018 07/02/2017  Falls in the past year? 0 0 0 1 No  Comment - - - - -  Number falls in past yr: 0 0 0 0 -  Injury with Fall? 0 - - - -  Risk for fall due to : No Fall Risks - - - -  Follow up Falls evaluation completed;Education provided - - - -   Is the patient's home free of loose throw rugs in walkways, pet beds, electrical cords, etc?   yes      Grab bars in the bathroom? yes      Handrails on the stairs?   yes      Adequate lighting?   yes  Timed Get Up and Go performed: not indicated  Depression Screen PHQ 2/9 Scores 07/09/2019 07/04/2018 07/03/2018 07/02/2017  PHQ - 2 Score 0 0 0 0     Cognitive Function     6CIT  Screen 07/09/2019  What Year? 0 points  What month? 0 points  What time? 0 points  Count back from 20 0 points  Months in reverse 0 points  Repeat phrase 0 points  Total Score 0    Immunization History  Administered Date(s) Administered  . Influenza Split 11/07/2010  . Influenza Whole 10/30/2009, 11/27/2011  . Influenza, High Dose Seasonal PF 10/27/2012, 11/05/2013, 10/01/2017, 09/11/2018  . Influenza-Unspecified 11/24/2014, 10/07/2015, 10/08/2016  . PFIZER SARS-COV-2 Vaccination 03/14/2019, 04/06/2019  . Pneumococcal Conjugate-13 10/07/2015  . Pneumococcal Polysaccharide-23 07/02/2017  . Tdap 02/11/2012  . Zoster 01/09/2012  . Zoster  Recombinat (Shingrix) 10/01/2017, 12/03/2017    Qualifies for Shingles Vaccine? completed  Screening Tests Health Maintenance  Topic Date Due  . Hepatitis C Screening  Never done  . INFLUENZA VACCINE  08/23/2019  . DEXA SCAN  11/05/2021  . TETANUS/TDAP  02/10/2022  . COVID-19 Vaccine  Completed  . PNA vac Low Risk Adult  Completed    Cancer Screenings: Lung: Low Dose CT Chest recommended if Age 32-80 years, 30 pack-year currently smoking OR have quit w/in 15years. Patient does not qualify. Breast:  Up to date on Mammogram? Yes   Up to date of Bone Density/Dexa? Yes Colorectal: Yes  Additional Screenings: Hepatitis C Screening: never done     Plan:     Reviewed health maintenance screenings with patient today and relevant education, vaccines, and/or referrals were provided.    Continue doing brain stimulating activities (puzzles, reading, adult coloring books, staying active) to keep memory sharp.    Continue to eat heart healthy diet (full of fruits, vegetables, whole grains, lean protein, water--limit salt, fat, and sugar intake) and increase physical activity as tolerated.   I have personally reviewed and noted the following in the patient's chart:   . Medical and social history . Use of alcohol, tobacco or illicit drugs  . Current medications and supplements . Functional ability and status . Nutritional status . Physical activity . Advanced directives . List of other physicians . Hospitalizations, surgeries, and ER visits in previous 12 months . Vitals . Screenings to include cognitive, depression, and falls . Referrals and appointments  In addition, I have reviewed and discussed with patient certain preventive protocols, quality metrics, and best practice recommendations. A written personalized care plan for preventive services as well as general preventive health recommendations were provided to patient.     Sheral Flow, LPN  7/89/3810  Nurse Health  Advisor

## 2019-07-28 DIAGNOSIS — R112 Nausea with vomiting, unspecified: Secondary | ICD-10-CM | POA: Diagnosis not present

## 2019-07-28 DIAGNOSIS — R1310 Dysphagia, unspecified: Secondary | ICD-10-CM | POA: Diagnosis not present

## 2019-07-28 DIAGNOSIS — K219 Gastro-esophageal reflux disease without esophagitis: Secondary | ICD-10-CM | POA: Diagnosis not present

## 2019-07-28 DIAGNOSIS — K2270 Barrett's esophagus without dysplasia: Secondary | ICD-10-CM | POA: Diagnosis not present

## 2019-08-03 ENCOUNTER — Other Ambulatory Visit (HOSPITAL_COMMUNITY): Payer: Medicare Other

## 2019-08-04 ENCOUNTER — Encounter: Payer: Self-pay | Admitting: Internal Medicine

## 2019-08-05 ENCOUNTER — Other Ambulatory Visit (INDEPENDENT_AMBULATORY_CARE_PROVIDER_SITE_OTHER): Payer: Medicare Other

## 2019-08-05 DIAGNOSIS — L661 Lichen planopilaris: Secondary | ICD-10-CM | POA: Diagnosis not present

## 2019-08-05 DIAGNOSIS — L668 Other cicatricial alopecia: Secondary | ICD-10-CM | POA: Diagnosis not present

## 2019-08-05 DIAGNOSIS — J849 Interstitial pulmonary disease, unspecified: Secondary | ICD-10-CM

## 2019-08-05 DIAGNOSIS — Z5181 Encounter for therapeutic drug level monitoring: Secondary | ICD-10-CM

## 2019-08-05 LAB — HEPATIC FUNCTION PANEL
ALT: 16 U/L (ref 0–35)
AST: 20 U/L (ref 0–37)
Albumin: 4.5 g/dL (ref 3.5–5.2)
Alkaline Phosphatase: 84 U/L (ref 39–117)
Bilirubin, Direct: 0.1 mg/dL (ref 0.0–0.3)
Total Bilirubin: 0.4 mg/dL (ref 0.2–1.2)
Total Protein: 7.3 g/dL (ref 6.0–8.3)

## 2019-08-06 NOTE — Progress Notes (Signed)
LFT normal

## 2019-08-12 DIAGNOSIS — K2100 Gastro-esophageal reflux disease with esophagitis, without bleeding: Secondary | ICD-10-CM | POA: Diagnosis not present

## 2019-08-12 DIAGNOSIS — K219 Gastro-esophageal reflux disease without esophagitis: Secondary | ICD-10-CM | POA: Diagnosis not present

## 2019-08-12 DIAGNOSIS — K2270 Barrett's esophagus without dysplasia: Secondary | ICD-10-CM | POA: Diagnosis not present

## 2019-08-12 DIAGNOSIS — R1310 Dysphagia, unspecified: Secondary | ICD-10-CM | POA: Diagnosis not present

## 2019-08-13 ENCOUNTER — Other Ambulatory Visit (HOSPITAL_COMMUNITY): Payer: Self-pay | Admitting: Gastroenterology

## 2019-08-13 ENCOUNTER — Other Ambulatory Visit: Payer: Self-pay | Admitting: Gastroenterology

## 2019-08-13 DIAGNOSIS — R110 Nausea: Secondary | ICD-10-CM

## 2019-08-17 DIAGNOSIS — H04123 Dry eye syndrome of bilateral lacrimal glands: Secondary | ICD-10-CM | POA: Diagnosis not present

## 2019-08-17 DIAGNOSIS — Z961 Presence of intraocular lens: Secondary | ICD-10-CM | POA: Diagnosis not present

## 2019-08-17 DIAGNOSIS — H40013 Open angle with borderline findings, low risk, bilateral: Secondary | ICD-10-CM | POA: Diagnosis not present

## 2019-08-18 ENCOUNTER — Ambulatory Visit (INDEPENDENT_AMBULATORY_CARE_PROVIDER_SITE_OTHER): Payer: Medicare Other | Admitting: Internal Medicine

## 2019-08-18 ENCOUNTER — Ambulatory Visit: Payer: Medicare Other | Admitting: Internal Medicine

## 2019-08-18 ENCOUNTER — Other Ambulatory Visit: Payer: Self-pay

## 2019-08-18 DIAGNOSIS — J849 Interstitial pulmonary disease, unspecified: Secondary | ICD-10-CM | POA: Diagnosis not present

## 2019-08-18 NOTE — Progress Notes (Signed)
Spirometry and Dlco done today. 

## 2019-08-20 ENCOUNTER — Ambulatory Visit (HOSPITAL_COMMUNITY)
Admission: RE | Admit: 2019-08-20 | Discharge: 2019-08-20 | Disposition: A | Discharge status: Home / Self Care | Payer: Medicare Other | Source: Ambulatory Visit | Attending: Gastroenterology | Admitting: Gastroenterology

## 2019-08-20 ENCOUNTER — Other Ambulatory Visit: Payer: Self-pay

## 2019-08-20 DIAGNOSIS — R110 Nausea: Secondary | ICD-10-CM | POA: Insufficient documentation

## 2019-08-20 MED ORDER — TECHNETIUM TC 99M SULFUR COLLOID
1.9700 | Freq: Once | INTRAVENOUS | Status: AC | PRN
  Administered 2019-08-20: 1.97 via ORAL

## 2019-08-21 ENCOUNTER — Ambulatory Visit (INDEPENDENT_AMBULATORY_CARE_PROVIDER_SITE_OTHER): Payer: Medicare Other | Admitting: Internal Medicine

## 2019-08-21 DIAGNOSIS — J84112 Idiopathic pulmonary fibrosis: Secondary | ICD-10-CM

## 2019-08-21 DIAGNOSIS — Z7189 Other specified counseling: Secondary | ICD-10-CM

## 2019-08-21 DIAGNOSIS — Z7185 Encounter for immunization safety counseling: Secondary | ICD-10-CM

## 2019-08-21 NOTE — Progress Notes (Addendum)
IOV 12/04/2017: Dr Lamonte Sakai: [name] is a 79 year old never smoker with a history of allergic rhinitis, GERD, hypothyroidism, MVP.  She is been seen in our office in the past by Dr. Gwenette Greet for restless leg syndrome-she still treated for this with Requip. On allegra, nexium prn.   She is referred today for dyspnea.  She reports that she has had an exercise routine where she walked w her husband 2 miles, exercises at the [facility], for several years. She has noticed more SOB, especially with mild hills. Sometimes now has to stop briefly to complete the walk. Occasional chest tightness, no wheeze. No overt CP. She reports daily cough, often in the am, sometimes clear mucous. She is on allegra. She had a walking stress test 09/2014 > reassuring ECG w exercise. Weight has been stable. Her last TSH was 06/25/17, 0.73.   Review of her notes shows hx possible GGI on prior CT 2014.   ROV 02/26/18 [name] --79 year old never smoker with a history of mitral valve prolapse whom I saw earlier this year for exertional dyspnea and chronic cough.  Pulmonary function testing consistent with restriction.  She has GERD and allergic rhinitis both of which could be contributing to her chronic cough.  I performed a high-resolution CT scan of the chest on 1/20 and reviewed today, this shows some patchy confluent subpleural reticular disease and groundglass attenuation with some minimal traction bronchiectasis and no frank honeycomb change.  This reflects a progression compared with 2014 and is an NSIP pattern.  Walking oximetry at her last visit did not show any exertional desaturation. She is still coughing, is using fexofenadine, takes nexium qod.   [place], had pesticide exposure.   ROV 08/26/2018 [name] --follow-up visit for 79 year old woman with a history of mitral valve prolapse, chronic cough, dyspnea with restrictive lung disease noted on pulmonary function testing.  High Res CT scan of the chest shows  some mild interstitial disease.  She was also having cough - better with flonase; still on nexium, allegra. Her exertional tolerance is improved, she has been exercising more. She remains on Requip for RLS.   We performed autoimmune labs last time, ANA positive at low titer (1: 80), SSA and SSB negative, RNP negative, RF negative, CCP negative, Anti-Smith negative, anti-SCL negative, DS DNA negative, anti-Jo negative, aldolase negative. She hasn't been seen in ILD clinic yet.    OV 10/08/2018  Subjective:  Patient ID: Elizabeth Espinoza, female , DOB: 07-19-1940 , age 79 y.o. , MRN: [id number] , ADDRESS: [place] [id number]   10/08/2018 -   Chief Complaint  Patient presents with  . Interstitial Lung Disease    Breathing the same as it was during August 2020 office visit with Dr. Lamonte Sakai     HPI Elizabeth Espinoza 79 y.o. -has been referred to the interstitial lung disease clinic because of findings of interstitial lung disease.  History is gathered from talking to her, review of Dr. Collene Gobble notes and also the integrated ILD questionnaire.  Briefly, she tells me that she was working out at [employer] and would just notice occasional dyspnea but she really did not compare it with other people.  Then in October 2019 she started walking 3 miles daily except on the days it rains with a husband.  During this time she noticed that she was falling really behind because of shortness of breath and exertional fatigue.  This resulted in subsequent  evaluation all documented above.  Findings of interstitial lung disease with subpleural reticulation suggestive of an alternative diagnosis.  Autoimmune profile essentially negative other than trace positive ANA.  She tells me that her significant major problem is exertional dyspnea when walking stairs or walking several miles.  She did not desaturate in our office several months ago.  She does not know if she desaturates when she exerts walking 3  miles.  Pulmonary function test earlier this year is just isolated low DLCO.  She also has like a cough.  Overall since the onset of the symptoms by exercising herself more and conditioning she is somewhat better.  She did see Dr. Kirk Ruths in May 2020 and in June 2020 had a coronary calcium CT which appears to have no calcium deposits.   [place] Integrated Comprehensive ILD Questionnaire  Symptoms:  -Dyspnea started suddenly and since it started it is better.  She says it is been present for years although she did tell me that it is only there since October 2019 when she noticed that.  Severity is listed below.  She does have a cough almost 1 year.  Since it started it is better.  It is mostly in the morning.  She does bring up some phlegm.  Early on in the morning it is green or yellow.  Since it started it is the same/better.  There is no wheezing.  She does have some chest tightness with this when she walks.  It is relieved by rest.  Cardiac work-up in June 2020 showed no calcium deposits.   Past Medical History : Positive for chronic longstanding acid reflux disease and thyroid disease..  In addition CT scan from January 2020 shows hiatal hernia that is small..  This presence of  sclerosis in the bony structures in January 2020.-Primary care physician has been sent a message today.  There is no asthma or COPD or heart failure rheumatoid arthritis or collagen vascular disease.  She does have GERD and hiatal hernia for several years to decades.  No sleep apnea.  No blood clots.  No hepatitis.  No tuberculosis.  No pleurisy.  , ANA positive at low titer (1: 80), SSA and SSB negative, RNP negative, RF negative, CCP negative, Anti-Smith negative, anti-SCL negative, DS DNA negative, anti-Jo negative, aldolase negative. She hasn't been seen in ILD clinic yet.    ROS:  -She does have fatigue for the last several years.  She does have some back and hip issues.  She does have dry eyes.  She does have  like some dysphagia.  There is presence of hiatal hernia-there is a small.  Acid reflux for several decades.  She also reports presence of nonspecific rash   FAMILY HISTORY of LUNG DISEASE: * -Her father died of mesothelioma in nineteen 83/1984 at the age of 75 otherwise no lung disease.   EXPOSURE HISTORY:   -When she was 17 she smokes cigarettes but otherwise no cigarette or tobacco use or electronic cigarette.  Never smoked marijuana.  No cocaine use no intravenous drug use.   HOME and HOBBY DETAILS :  -Single[place]family home [place] setting for the last 16 years in a 78 year old home.  No mold or mildew exposure in the [facility] duct or CPAP mask or humidifier.  No mold or mildew in the bathroom.  No pet birds in the house.  No misting [place].  No feather pillows no feather duvet.  No musical instruments.  She does some occasional gardening which she likes.  She does do some fine-needle work.   OCCUPATIONAL HISTORY (122 questions) :  = Essentially negative except for the fact when she was a child she did some tobacco growing.  She has done home gardening for 50 years.    PULMONARY TOXICITY HISTORY (27 items):  denies  Results for AVIELLE, IMBERT (MRN [id number]) as of 11/07/2018 11:20  Ref. Range 02/26/2018 12:17  Anti Nuclear Antibody (ANA) Latest Ref Range: NEGATIVE  POSITIVE (A)  ANA Pattern 1 Unknown Nuclear, Speckled (A)  ANA Titer 1 Latest Units: titer 1:80 (H)  Anti JO-1 Latest Ref Range: 0.0 - 0.9 AI <1.6  Cyclic Citrullin Peptide Ab Latest Units: UNITS <16  ds DNA Ab Latest Units: IU/mL <1  RA Latex Turbid. Latest Ref Range: <14 IU/mL <14  ENA SM Ab Ser-aCnc Latest Ref Range: <1.0 NEG AI <1.0 NEG  Ribonucleic Protein(ENA) Antibody, IgG Latest Ref Range: <1.0 NEG AI <1.0 NEG  SSA (Ro) (ENA) Antibody, IgG Latest Ref Range: <1.0 NEG AI <1.0 NEG  SSB (La) (ENA) Antibody, IgG Latest Ref Range: <1.0 NEG AI <1.0 NEG  Scleroderma (Scl-70) (ENA) Antibody, IgG Latest Ref Range: <1.0 NEG AI  <1.0 NEG   Results for LEZLIE, RITCHEY (MRN [id number]) as of 11/07/2018 11:20  Ref. Range 01/29/2018 14:31  FVC-Pre Latest Units: L 2.97  FVC-%Pred-Pre Latest Units: % 98  Results for MANESSA, BULEY (MRN [id number]) as of 11/07/2018 11:20  Ref. Range 01/29/2018 14:31  DLCO unc Latest Units: ml/min/mmHg 14.80  DLCO unc % pred Latest Units: % 54    OV 11/07/2018  Subjective:  Patient ID: Elizabeth Espinoza, female , DOB: 1940/03/13 , age 23 y.o. , MRN: [id number] , ADDRESS: [place] [id number]   11/07/2018 -   Chief Complaint  Patient presents with  . Follow-up    Patient reports that she has sob with any exertion.    Follow-up interstitial lung disease  HPI KITT MINARDI 79 y.o. -last seen September 2020.  After that she was supposed to have follow-up high-resolution CT chest and spirometry DLCO.  For some reason the spirometry DLCO is not done.  In the interim her symptoms remain the same as shown by the symptom score below.  She does note when she does heavy exertion such as climbing stairs or long uphill walks her pulse ox drops to 84% but quickly regains.  She is not interested in portable oxygen.  She had a repeat high-resolution CT chest in September 2020 and when compared to January 2020 there is no significant change.  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  I personally visualized the film.  There is diffuse bilateral subpleural reticulation and some traction bronchiectasis.  I myself would say it may be this is indeterminate for UIP.  But there is some minimal air trapping and she is done some gardening work.  There is no upper zonal predominance that would fit in with hypersensitive pneumonitis.  She has incidental findings of bony sclerosis that is again repeated in the CT scan.  Her primary care physician Billey Gosling evaluated her.  I reviewed the note.  She is been referred to  Dr. Narda Rutherford in hematology who she is seeing November 12, 2018.  She is very confused about the fact that she has a bony sclerosis problem for which she is being referred to a blood doctor [hematologist] and she thought she is having a blood  test that was ordered by me.  I reviewed the chart and clarified this concepts   Ct Chest High Resolution  Result Date: 11/05/2018 CLINICAL DATA:  79 year old female with history of interstitial lung disease. Increased shortness of breath and cough over the past year. EXAM: CT CHEST WITHOUT CONTRAST TECHNIQUE: Multidetector CT imaging of the chest was performed following the standard protocol without intravenous contrast. High resolution imaging of the lungs, as well as inspiratory and expiratory imaging, was performed. COMPARISON:  High-resolution chest CT 02/10/2018. FINDINGS: Cardiovascular: Heart size is normal. There is no significant pericardial fluid, thickening or pericardial calcification. There is aortic atherosclerosis, as well as atherosclerosis of the great vessels of the mediastinum and the coronary arteries, including calcified atherosclerotic plaque in the left anterior descending and right coronary arteries. Mild calcifications of the mitral annulus. Mediastinum/Nodes: No pathologically enlarged mediastinal or hilar lymph nodes. Please note that accurate exclusion of hilar adenopathy is limited on noncontrast CT scans. Esophagus is unremarkable in appearance. No axillary lymphadenopathy. Lungs/Pleura: High-resolution images demonstrates some patchy areas of peripheral predominant septal thickening and subpleural reticulation, with mild cylindrical bronchiectasis and peripheral bronchiolectasis. No frank honeycombing confidently identified at this time. These findings have no definitive craniocaudal gradient. In the periphery of the mid to upper lungs there also some plaque-like areas of apparent pleuroparenchymal scarring and volume loss. Inspiratory and  expiratory imaging demonstrates minimal air trapping indicative of very mild small airways disease. Overall, these imaging findings appear stable compared to the prior study. Upper Abdomen: Aortic atherosclerosis. Musculoskeletal: Mild diffuse sclerosis throughout the visualized axial and appendicular skeleton, similar to prior examinations. There are no definite focal aggressive appearing lytic or blastic lesions noted in the visualized portions of the skeleton. IMPRESSION: 1. 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 2. Aortic atherosclerosis, in addition to 2 vessel coronary artery disease. Assessment for potential risk factor modification, dietary therapy or pharmacologic therapy may be warranted, if clinically indicated. 3. Persistent mild diffuse sclerosis throughout the visualized osseous structures without discrete aggressive appearing osseous lesions. Clinical correlation for signs and symptoms of potential infiltrative process such as myelofibrosis is suggested. Aortic Atherosclerosis (ICD10-I70.0). Electronically Signed   By: Vinnie Langton M.D.   On: 11/05/2018 14:36     OV 12/22/2018  Subjective:  Patient ID: Elizabeth Espinoza, female , DOB: Sep 06, 1940 , age 33 y.o. , MRN: [id number] , ADDRESS: [place] [address]   12/22/2018 -   Chief Complaint  Patient presents with  . Follow-up    Pt states she has been doing good since last visit. Pt is still coughing and will get up clear mucus in the morning.   Follow-up interstitial lung disease with CT scan October 2020 being indeterminate versus alternate diagnosis.  History of gardening.  Trace autoimmune ANA  positive.  Mild progression since 2014  HPI SADA MAZZONI 79 y.o. -returns for follow-up.  She presents with her husband who I am meeting for the first time.  In the interim she met Dr. Roxan Hockey thoracic surgeon for surgical lung biopsy.  Her husband also met with him.  I reviewed the note.  She tells me that given the morbidity with surgical lung biopsy she wants to undergo bronchoscopy with  lavage and transbronchial biopsy first.  She understands the inherent limitations of these procedure in terms of diagnosis.  But she wants to take the lower risk profile.  Overall she feels stable.  Her symptom score is improved compared to the past.  Her walking desaturation test shows exaggerated drop in pulse ox but this was done with her wearing the mask.  She did not feel any dyspnea.  In terms of her bony sclerosis she has seen Dr. Lorenso Courier at [facility].  I reviewed the note.  He has reassured her.  Risks of pneumothorax, hemothorax, sedation/anesthesia complications such as cardiac or respiratory arrest or hypotension, stroke and bleeding all explained. Benefits of diagnosis but limitations of non-diagnosis also explained. Patient verbalized understanding and wished to proceed.   They want to have the bronchoscopy after the holidays of Christmas and new year.  This is because their house is undergoing remodeling currently.       SYMPTOM SCALE - ILD      O2 use 10/08/2018  11/07/2018  12/22/2018   Shortness of Breath 0 -> 5 scale with 5 being worst (score 6 If unable to do)  ra  At rest 0 0 0  Simple tasks - showers, clothes change, eating, shaving 1 1 0  Household (dishes, doing bed, laundry) _0 Shopping 2 2 0.5  Walking level at own pace 2 1 0.5  Walking keeping up with others of same age 57 2 0  Walking up Stairs _1 Walking up [place] 5 5 3.5  Total (40 - 48) Dyspnea Score 20 18 9.52.5  How bad is your cough? 1 3 2.5  How bad is your fatigue 2 2.5 1.5 am, 4.5 pm      OV  03/16/2019  Subjective:  Patient ID: Elizabeth Espinoza, female , DOB: May 15, 1940 , age 42 y.o. , MRN: [id number] , ADDRESS: [place] [address]   03/16/2019 -   Chief Complaint  Patient presents with  . Follow-up    PFT performed today.  Pt states she has been doing okay since last visit and states her breathing is about the same.     Finally able to review esults of [facility] send out test to [place]. Date of test is 02/05/2019. 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 -returns for follow-up to discuss bronchoscopy and lavage results.  In the interim no new respiratory issues.  They are all stable.  However she is having significant musculoskeletal issues with shoulder pain.  Serology from a year ago was negative.  They wanted to know about relatedness.  I told him it is probably not related.  We went over the bronchoscopy lavage results which showed some dominance of neutrophils consistent with UIP.  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   We had a long discussion about the benefits, risks and limitations of antifibrotic therapy.  We also discussed the choice between pirfenidone and nintedanib.       Results for VINESSA, MACCONNELL (MRN [id number]) as of 03/16/2019 11:12  Ref. Range 01/29/2018 14:31 03/16/2019 08:42  FVC-Pre Latest Units: L 2.97 2.91  FVC-%Pred-Pre Latest Units: % 98 97   Results for ALYVIAH, CRANDLE (MRN [id number]) as of 03/16/2019 11:12  Ref. Range 01/29/2018 14:31 03/16/2019 08:42  DLCO unc Latest Units: ml/min/mmHg 14.80 12.32  DLCO unc % pred Latest Units: % 54 60  OV 06/11/2019 - telephine visit. Patient identified with 2 pHI, risks, benefit, limitations of tele visit explained  Subjective:  Patient ID: Elizabeth Espinoza, female , DOB: 03/06/1940 , age 33 y.o. , MRN: [id number] , ADDRESS: [place] [address] ate of test is 02/05/2019. 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 -in this telephone visit patient is now on pirfenidone.  She says she is on pirfenidone since mid April 2021.  She is on 3 pills 3 times a day.  She spacing them 4 hours apart but having some intermittent nausea every few days.  She did have an urticaria but that was before she started pirfenidone was related to Covid vaccine that is now resolved.  She is asking about exercising at the [facility] and Covid precautions.  I advised her because she is vaccinated that the risk is low but not 0 and to take adequate precautions as tolerated.  She had liver function test and this is normal.  Her next appointment for pulmonary function test is in mid July 2021.  Recommended she make a face-to-face visit with me at that time.   OV 08/21/2019   Subjective:  Patient ID: Elizabeth Espinoza, female , DOB: Jan 15, 1941, age 35 y.o. years. , MRN: [id number],  ADDRESS: [place] [address] PCP  Binnie Rail, MD Providers : Treatment Team:  Attending Provider: Brand Males, MD  Type of visit: Telephone Circumstance: COVID-19 national emergency Identification of patient Elizabeth Espinoza - 2 person identifier Risks: Risks, benefits, limitations of telephone visit explained Patient location: home This provider location: [place] [facility]      Chief Complaint  Patient presents with  . Follow-up    PFT 7/27--c/o sob with stairs and throat clearing mainly in the morning. stopped Esbriet on 08/12/2019.   Follow-up interstitial lung disease with CT scan October 2020 being indeterminate versus alternate diagnosis.  History of gardening.  Trace autoimmune ANA positive.  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 -presents for this telephone visit for IPF.  On this telephone visit she was identified with 2 person identifier.  Risks, benefits and limitations of telephone visit explained.  After last visit her GI symptoms worsen.  She did see Dr. Young Berry gastroenterologist.  She underwent endoscopy.  Dr. Collene Mares did send the results to me I do not have this.  I remember Dr. Collene Mares calling me and discussing patient's GI side effects.  These were from pirfenidone so on August 12, 2019 based on my advise she start pirfenidone.  Since then her symptoms of GI nature have resolved.  Her respiratory symptoms continue to be stable.  These are all documented below.  At this point in time she feels that her quality of life is very important.  She does not want to go through the kind of GI side effect she went through with pirfenidone.  She would rather deal with the disease.  She is aware that nintedanib is a standard of care therapeutic  option.  She is also aware after discussion the clinical trials as a care option particularly in the past to discovering new therapies that do not have this GI side effects that pirfenidone poses.  She had a lot of questions about Covid vaccine.  She is fully vaccinated.  She is worried about her grandson was refusing the vaccine.     SYMPTOM SCALE - ILD 03/16/2019  08/21/2019 Off esbriet since 08/12/19  O2 use ra ra  Shortness of Breath 0 -> 5 scale with 5 being worst (score 6 If unable to do)   At rest 0 0  Simple tasks - showers, clothes change, eating, shaving 1   Household (dishes, doing bed, laundry) 4 Do not notice othe than rushing aorund  Shopping 3   Walking level at own pace 2   Walking up Stairs 5 5  Total (30-36) Dyspnea Score 15   How bad is your cough? 0 Only very mild in the morning  How bad is your fatigue 0 4.5  on esbriet-> 0.5 now  How bad is nausea 0 5 during esbrit -> 0 now. Was  Getting gagging thinking of food on esbriet  How bad is vomiting?  0 Had some vomit during esbriet -> none since stopping esbriet  How bad is diarrhea? 0 0 at all time  How bad is anxiety? 1.5 0  How bad is depression 0         Simple office walk 185 feet x  3 laps goal with forehead probe 10/08/2018  11/07/2018  12/22/2018  03/16/2019   O2 used ra ra ra - wor mask ra  Number laps completed _0 Comments about pace good fast Mod pace Fast pace with mask  Resting Pulse Ox/HR 98% and 77/min 100% and 62/min 97% ad 70.min 99% and 59 min  Final Pulse Ox/HR 95% and 96/min 96% and 105/min 90%and 110/mi 96% and 119  Desaturated </= 88% no no no no  Desaturated <= 3% points yes Yes, 4 ppoints Yes, 7 ponits Yes, 3 points  Got Tachycardic >/= 90/min yes yes yes   Symptoms at end of test none none none Mild dysnea  Miscellaneous comments x  ? Mask related worsening      Results for KASIYA, BURCK (MRN [id number]) as of 08/21/2019 15:37  Ref. Range 08/05/2019 12:49  AST Latest Ref Range: 0 - 37 U/L 20  ALT Latest Ref Range: 0 - 35 U/L 16    PFT Results Latest Ref Rng & Units 08/18/2019 03/16/2019 01/29/2018  FVC-Pre L 2.92 2.91 2.97  FVC-Predicted Pre % 99 97 98  FVC-Post L - - 2.89  FVC-Predicted Post % - - 95  Pre FEV1/FVC % % 80 79 81  Post FEV1/FCV % % - - 83  FEV1-Pre L 2.34 2.29 2.40  FEV1-Predicted Pre % 106 102 105  FEV1-Post L - - 2.39  DLCO uncorrected ml/min/mmHg 12.84 12.32 14.80  DLCO UNC% % 63 60 54  DLCO corrected ml/min/mmHg 13.26 12.91 -  DLCO COR %Predicted % 65 63 -  DLVA Predicted % 71 73 68      ROS - per HPI     has a past medical history of Allergic rhinitis, GERD (gastroesophageal reflux disease), Glaucoma, Hiatal hernia, Hyperlipidemia, Hypothyroidism, ILD (interstitial lung disease) ([place]), Lichen planopilaris, MVP (mitral valve prolapse), Restless legs, and Urticaria.    reports that she has never smoked. She has never used smokeless tobacco.  Past Surgical History:  Procedure Laterality Date  . APPENDECTOMY     with TAH  . CARDIAC CATHETERIZATION  1989   negative  . COLONOSCOPY  2005   Dr Olevia Perches  . COLONOSCOPY  01/2013   Dr Collene Mares  . esophageal dilation  07/04/2011   Dr Olevia Perches  . FLEXIBLE SIGMOIDOSCOPY  1999    Dr Olevia Perches  . KNEE ARTHROSCOPY  2001, 2005   Dr Percell Miller   bilat  . TOTAL ABDOMINAL HYSTERECTOMY     for Endometriosis; no BSO, Dr Colin Ina  . UPPER GI ENDOSCOPY  01/2013   Dr Collene Mares  . VIDEO BRONCHOSCOPY Bilateral 02/05/2019   Procedure: VIDEO BRONCHOSCOPY WITH FLUORO;  Surgeon: Brand Males, MD;  Location: [facility] ENDOSCOPY;  Service: Endoscopy;  Laterality: Bilateral;    Allergies  Allergen Reactions  . Caffeine     palpitations  . Chlorpheniramine-Pseudoeph     palpitations  . Maxifed     palpitations    Immunization History  Administered Date(s) Administered  . Influenza Split 11/07/2010  . Influenza Whole 10/30/2009, 11/27/2011  . Influenza, High Dose Seasonal PF 10/27/2012, 11/05/2013, 10/01/2017, 09/11/2018  . Influenza-Unspecified 11/24/2014, 10/07/2015, 10/08/2016  . PFIZER SARS-COV-2 Vaccination 03/14/2019, 04/06/2019  . Pneumococcal Conjugate-13 10/07/2015  . Pneumococcal Polysaccharide-23 07/02/2017  . Tdap 02/11/2012  . Zoster 01/09/2012  . Zoster Recombinat (Shingrix) 10/01/2017, 12/03/2017    Family History  Problem Relation Age of Onset  . Lung cancer Father        Asbestos with mesothelioma  . Heart disease Paternal Grandmother        ? etiology  . Diabetes Paternal Grandmother   . Diabetes Brother   . Uterine cancer Paternal Aunt   . Stroke Paternal Grandfather        > 59  . Heart attack Brother 82  . Allergies Sister   . Aneurysm Mother        congenital  . Stroke Brother        two strokes  . Barrett's esophagus Brother      Current Outpatient Medications:  .  aspirin EC 81 MG tablet, Take  1 tablet (81 mg total) by mouth daily., Disp: 90 tablet, Rfl: 3 .  Biotin 5000 MCG TABS, Take 5,000 mcg by mouth daily., Disp: , Rfl:  .  camphor-menthol (SARNA) lotion, Apply 1 application topically as needed for itching., Disp: , Rfl:  .  Carboxymethylcellul-Glycerin (REFRESH RELIEVA OP), Place 1 drop into both eyes every 6 (six) hours as needed (dry eyes)., Disp: , Rfl:  .  cetirizine (ZYRTEC) 10 MG tablet, Take 10 mg by mouth 2 (two) times daily., Disp: , Rfl:  .  cholecalciferol (VITAMIN D3) 25 MCG (1000 UT) tablet, Take 1,000 Units by mouth daily., Disp: , Rfl:  .  clobetasol (TEMOVATE) 0.05 % external solution, SMARTSIG:1 Milliliter(s) Topical Twice Daily, Disp: , Rfl:  .  diphenhydrAMINE (BENADRYL) 25 MG tablet, as needed., Disp: , Rfl:  .  esomeprazole (NEXIUM) 40 MG capsule, Take 40 mg by mouth every morning., Disp: , Rfl:  .  famotidine (PEPCID) 40 MG tablet, Take 1 tablet (40 mg total) by mouth daily., Disp: 90 tablet, Rfl: 1 .  fluticasone (FLONASE) 50 MCG/ACT nasal spray, Place 1 spray into both nostrils daily as needed for allergies or rhinitis., Disp: , Rfl:  .  hydroxychloroquine (PLAQUENIL) 200 MG tablet, Take 200 mg by mouth 2 (two) times daily., Disp: , Rfl:  .  hydrOXYzine (ATARAX/VISTARIL) 25 MG tablet, Take 25 mg by mouth 3 (three) times daily.,  Disp: , Rfl:  .  levothyroxine (SYNTHROID) 88 MCG tablet, Take 1 tablet (88 mcg total) by mouth daily., Disp: 90 tablet, Rfl: 3 .  magnesium oxide (MAG-OX) 400 MG tablet, Take 400 mg by mouth daily., Disp: , Rfl:  .  Multiple Vitamins-Minerals (ZINC PO), Take 1 tablet by mouth., Disp: , Rfl:  .  ondansetron (ZOFRAN ODT) 4 MG disintegrating tablet, Take 1 tablet (4 mg total) by mouth every 8 (eight) hours as needed for nausea or vomiting., Disp: 20 tablet, Rfl: 0 .  pravastatin (PRAVACHOL) 40 MG tablet, TAKE 1 TABLET BY MOUTH  DAILY (Patient taking differently: Take 40 mg by mouth at bedtime. ), Disp: 90 tablet, Rfl: 2 .  rOPINIRole  (REQUIP) 0.5 MG tablet, TAKE 2 TABLETS BY MOUTH  AFTER DINNER EACH NIGHT. (Patient taking differently: Take 0.5-1 mg by mouth at bedtime. Dose depends on severity of restless legs), Disp: 180 tablet, Rfl: 2 .  triamcinolone cream (KENALOG) 0.1 %, APPLY TO AFFECTED AREA TWICE DAILY AS NEEDED FOR ITCHING, Disp: , Rfl:  .  Pirfenidone (ESBRIET) 267 MG CAPS, Take 3 capsules by mouth 3 (three) times daily as needed.  (Patient not taking: Reported on 08/21/2019), Disp: , Rfl:       Objective:   There were no vitals filed for this visit.  Estimated body mass index is 25.26 kg/m as calculated from the following:   Height as of 07/09/19: _0  (1.651 m).   Weight as of 07/09/19: 151 lb 12.8 oz (68.9 kg).  _1 @  There were no vitals filed for this visit.   Physical Exam  Sounded normal pn phone  Assessment:       ICD-10-CM   1. IPF (idiopathic pulmonary fibrosis) ([place])  J84.112   2. Encounter for medication review and counseling  Z71.89   3. Vaccine counseling  Z71.89        Plan:     Patient Instructions     ICD-10-CM   1. IPF (idiopathic pulmonary fibrosis) ([place])  J84.112   2. Encounter for medication review and counseling  Z71.89   3. Vaccine counseling  Z71.89    IPF is stable Severe GI side effects with esbriet =- have resolved after stopping esbriet Agree with severe gi side effects with a medication - is not a quality of life not worth living Extensively discussed vaccine about covid and current regulations  Plan  - list Esbriet as allergy (nausea, fatigue)  - continue regular medical care an daily exercise - allow GI track to reset off esbriet - wil discuss ofev in the future next visit  - at some point clinical trials is a care option (we discussed this as a concept)  Followup  - 30 min visit to see Dr Chase Caller - face to face - sept 2021 visit   (Telephone visit - Level 03 visit: Estb 21-30 for this visit type which was visit type: telephone visit in  total care time and counseling or/and coordination of care by this undersigned MD - Dr Brand Males. This includes one or more of the following for care delivered on 08/21/2019 same day: pre-charting, chart review, note writing, documentation discussion of test results, diagnostic or treatment recommendations, prognosis, risks and benefits of management options, instructions, education, compliance or risk-factor reduction. It excludes time spent by the [place] or office staff in the care of the patient. Actual time was 33 min. E&M code is [phone])    SIGNATURE    Dr. Brand Males, M.D., F.C.C.P,  Pulmonary and Critical Care Medicine Staff Physician, [place] Director - Interstitial Lung Disease  Program  Pulmonary [facility] [place], [place], [address]  Pager: [phone], If no answer or between  15:00h - 7:00h: call [phone] Telephone: [phone]  4:43 PM 08/21/2019

## 2019-08-21 NOTE — Patient Instructions (Addendum)
ICD-10-CM   1. IPF (idiopathic pulmonary fibrosis) ([place])  J84.112   2. Encounter for medication review and counseling  Z71.89   3. Vaccine counseling  Z71.89    IPF is stable Severe GI side effects with esbriet =- have resolved after stopping esbriet Agree with severe gi side effects with a medication - is not a quality of life not worth living Extensively discussed vaccine about covid and current regulations  Plan  - list Esbriet as allergy (nausea, fatigue)  - continue regular medical care an daily exercise - allow GI track to reset off esbriet - wil discuss ofev in the future next visit  - at some point clinical trials is a care option (we discussed this as a concept)  Followup  - 30 min visit to see Dr Chase Caller - face to face - sept 2021 visit

## 2019-08-25 DIAGNOSIS — K2100 Gastro-esophageal reflux disease with esophagitis, without bleeding: Secondary | ICD-10-CM | POA: Diagnosis not present

## 2019-08-25 DIAGNOSIS — K449 Diaphragmatic hernia without obstruction or gangrene: Secondary | ICD-10-CM | POA: Diagnosis not present

## 2019-08-31 ENCOUNTER — Other Ambulatory Visit: Payer: Self-pay | Admitting: Internal Medicine

## 2019-08-31 DIAGNOSIS — Z1231 Encounter for screening mammogram for malignant neoplasm of breast: Secondary | ICD-10-CM

## 2019-09-01 ENCOUNTER — Ambulatory Visit
Admission: RE | Admit: 2019-09-01 | Discharge: 2019-09-01 | Disposition: A | Discharge status: Home / Self Care | Payer: Medicare Other | Source: Ambulatory Visit | Attending: Internal Medicine | Admitting: Internal Medicine

## 2019-09-01 ENCOUNTER — Other Ambulatory Visit: Payer: Self-pay

## 2019-09-01 DIAGNOSIS — Z1231 Encounter for screening mammogram for malignant neoplasm of breast: Secondary | ICD-10-CM | POA: Diagnosis not present

## 2019-09-03 ENCOUNTER — Other Ambulatory Visit (INDEPENDENT_AMBULATORY_CARE_PROVIDER_SITE_OTHER): Payer: Medicare Other

## 2019-09-03 DIAGNOSIS — E039 Hypothyroidism, unspecified: Secondary | ICD-10-CM

## 2019-09-03 LAB — TSH: TSH: 5.16 u[IU]/mL — ABNORMAL HIGH (ref 0.35–4.50)

## 2019-09-04 ENCOUNTER — Encounter: Payer: Self-pay | Admitting: Internal Medicine

## 2019-09-04 ENCOUNTER — Other Ambulatory Visit: Payer: Self-pay

## 2019-09-04 ENCOUNTER — Other Ambulatory Visit: Payer: Self-pay | Admitting: Internal Medicine

## 2019-09-04 DIAGNOSIS — E039 Hypothyroidism, unspecified: Secondary | ICD-10-CM

## 2019-09-04 MED ORDER — LEVOTHYROXINE SODIUM 100 MCG PO TABS
100.0000 ug | ORAL_TABLET | Freq: Every day | ORAL | 3 refills | Status: DC
Start: 2019-09-04 — End: 2019-09-04

## 2019-09-04 MED ORDER — LEVOTHYROXINE SODIUM 100 MCG PO TABS: 100.0000 ug | ORAL_TABLET | Freq: Every day | ORAL | 3 refills | Status: DC

## 2019-09-15 LAB — PULMONARY FUNCTION TEST
DL/VA % pred: 71 %
DL/VA: 2.88 ml/min/mmHg/L
DLCO cor % pred: 65 %
DLCO cor: 13.26 ml/min/mmHg
DLCO unc % pred: 63 %
DLCO unc: 12.84 ml/min/mmHg
FEF 25-75 Pre: 2.33 L/sec
FEF2575-%Pred-Pre: 143 %
FEV1-%Pred-Pre: 106 %
FEV1-Pre: 2.34 L
FEV1FVC-%Pred-Pre: 107 %
FEV6-%Pred-Pre: 103 %
FEV6-Pre: 2.9 L
FEV6FVC-%Pred-Pre: 104 %
FVC-%Pred-Pre: 99 %
FVC-Pre: 2.92 L
Pre FEV1/FVC ratio: 80 %
Pre FEV6/FVC Ratio: 99 %

## 2019-09-24 DIAGNOSIS — D1801 Hemangioma of skin and subcutaneous tissue: Secondary | ICD-10-CM | POA: Diagnosis not present

## 2019-09-24 DIAGNOSIS — L814 Other melanin hyperpigmentation: Secondary | ICD-10-CM | POA: Diagnosis not present

## 2019-09-24 DIAGNOSIS — D225 Melanocytic nevi of trunk: Secondary | ICD-10-CM | POA: Diagnosis not present

## 2019-09-24 DIAGNOSIS — L820 Inflamed seborrheic keratosis: Secondary | ICD-10-CM | POA: Diagnosis not present

## 2019-09-24 DIAGNOSIS — D692 Other nonthrombocytopenic purpura: Secondary | ICD-10-CM | POA: Diagnosis not present

## 2019-09-25 ENCOUNTER — Other Ambulatory Visit: Payer: Self-pay | Admitting: Internal Medicine

## 2019-09-29 ENCOUNTER — Encounter: Payer: Self-pay | Admitting: Internal Medicine

## 2019-09-29 ENCOUNTER — Ambulatory Visit: Payer: Medicare Other | Admitting: Internal Medicine

## 2019-09-29 ENCOUNTER — Other Ambulatory Visit: Payer: Self-pay

## 2019-09-29 VITALS — BP 130/60 | HR 72 | Temp 97.6°F | Ht 66.0 in | Wt 147.8 lb

## 2019-09-29 DIAGNOSIS — Z7189 Other specified counseling: Secondary | ICD-10-CM

## 2019-09-29 DIAGNOSIS — J84112 Idiopathic pulmonary fibrosis: Secondary | ICD-10-CM

## 2019-09-29 NOTE — Patient Instructions (Addendum)
ICD-10-CM   1. IPF (idiopathic pulmonary fibrosis) ([place])  J84.112   2. Encounter for medication review and counseling  Z71.89     IPF is stable since last visit and on breathing test 09 March 2019 in August 2021 although you have declined since January 2020   Severe GI side effects with esbriet - have resolved after stopping esbriet  Respective concerned that side effects can happen with nintedanib the next alternative  Plan  - list Esbriet as allergy (nausea, fatigue) - disussed ofev as next option but at this point support your decision to just follow supportive care  -Discussed clinical trials as a care option -   -Visit our website [email or web address] to learn more about clinical trials -Continue daily exercises -Refer to pulmonary rehabilitation at [place] can use the breathing respiratory muscle trainer device [I printed this out] -Visit website [email or web address] an e-mail [email or web address]  - Mr Elizabeth Espinoza -he is a local support group leader  -Do high-resolution CT chest supine and prone in 4 months -Do repeat spirometry and DLCO in 4 months   Followup  - 30 min visit to see Dr Chase Caller - face to face -in 4 months but after spirometry and DLCO

## 2019-09-29 NOTE — Addendum Note (Signed)
Addended by: Vanessa Barbara on: 09/29/2019 02:14 PM   Modules accepted: Orders

## 2019-09-29 NOTE — Progress Notes (Signed)
IOV 12/04/2017: Dr Lamonte Sakai: [name] is a 79 year old never smoker with a history of allergic rhinitis, GERD, hypothyroidism, MVP.  She is been seen in our office in the past by Dr. Gwenette Greet for restless leg syndrome-she still treated for this with Requip. On allegra, nexium prn.   She is referred today for dyspnea.  She reports that she has had an exercise routine where she walked w her husband 2 miles, exercises at the [facility], for several years. She has noticed more SOB, especially with mild hills. Sometimes now has to stop briefly to complete the walk. Occasional chest tightness, no wheeze. No overt CP. She reports daily cough, often in the am, sometimes clear mucous. She is on allegra. She had a walking stress test 09/2014 > reassuring ECG w exercise. Weight has been stable. Her last TSH was 06/25/17, 0.73.   Review of her notes shows hx possible GGI on prior CT 2014.   ROV 02/26/18 [name] --79 year old never smoker with a history of mitral valve prolapse whom I saw earlier this year for exertional dyspnea and chronic cough.  Pulmonary function testing consistent with restriction.  She has GERD and allergic rhinitis both of which could be contributing to her chronic cough.  I performed a high-resolution CT scan of the chest on 1/20 and reviewed today, this shows some patchy confluent subpleural reticular disease and groundglass attenuation with some minimal traction bronchiectasis and no frank honeycomb change.  This reflects a progression compared with 2014 and is an NSIP pattern.  Walking oximetry at her last visit did not show any exertional desaturation. She is still coughing, is using fexofenadine, takes nexium qod.   [place], had pesticide exposure.   ROV 08/26/2018 [name] --follow-up visit for 79 year old woman with a history of mitral valve prolapse, chronic cough, dyspnea with restrictive lung disease noted on pulmonary function testing.  High Res CT scan of the chest shows  some mild interstitial disease.  She was also having cough - better with flonase; still on nexium, allegra. Her exertional tolerance is improved, she has been exercising more. She remains on Requip for RLS.   We performed autoimmune labs last time, ANA positive at low titer (1: 80), SSA and SSB negative, RNP negative, RF negative, CCP negative, Anti-Smith negative, anti-SCL negative, DS DNA negative, anti-Jo negative, aldolase negative. She hasn't been seen in ILD clinic yet.    OV 10/08/2018  Subjective:  Patient ID: Elizabeth Espinoza, female , DOB: 1940-07-26 , age 62 y.o. , MRN: [id number] , ADDRESS: [place] [address]   10/08/2018 -   Chief Complaint  Patient presents with  . Interstitial Lung Disease    Breathing the same as it was during August 2020 office visit with Dr. Lamonte Sakai     HPI Elizabeth Espinoza 79 y.o. -has been referred to the interstitial lung disease clinic because of findings of interstitial lung disease.  History is gathered from talking to her, review of Dr. Collene Gobble notes and also the integrated ILD questionnaire.  Briefly, she tells me that she was working out at [employer] and would just notice occasional dyspnea but she really did not compare it with other people.  Then in October 2019 she started walking 3 miles daily except on the days it rains with a husband.  During this time she noticed that she was falling really behind because of shortness of breath and exertional fatigue.  This resulted in  subsequent evaluation all documented above.  Findings of interstitial lung disease with subpleural reticulation suggestive of an alternative diagnosis.  Autoimmune profile essentially negative other than trace positive ANA.  She tells me that her significant major problem is exertional dyspnea when walking stairs or walking several miles.  She did not desaturate in our office several months ago.  She does not know if she desaturates when she exerts walking 3  miles.  Pulmonary function test earlier this year is just isolated low DLCO.  She also has like a cough.  Overall since the onset of the symptoms by exercising herself more and conditioning she is somewhat better.  She did see Dr. Kirk Ruths in May 2020 and in June 2020 had a coronary calcium CT which appears to have no calcium deposits.   Elizabeth Espinoza Integrated Comprehensive ILD Questionnaire  Symptoms:  -Dyspnea started suddenly and since it started it is better.  She says it is been present for years although she did tell me that it is only there since October 2019 when she noticed that.  Severity is listed below.  She does have a cough almost 1 year.  Since it started it is better.  It is mostly in the morning.  She does bring up some phlegm.  Early on in the morning it is green or yellow.  Since it started it is the same/better.  There is no wheezing.  She does have some chest tightness with this when she walks.  It is relieved by rest.  Cardiac work-up in June 2020 showed no calcium deposits.   Past Medical History : Positive for chronic longstanding acid reflux disease and thyroid disease..  In addition CT scan from January 2020 shows hiatal hernia that is small..  This presence of  sclerosis in the bony structures in January 2020.-Primary care physician has been sent a message today.  There is no asthma or COPD or heart failure rheumatoid arthritis or collagen vascular disease.  She does have GERD and hiatal hernia for several years to decades.  No sleep apnea.  No blood clots.  No hepatitis.  No tuberculosis.  No pleurisy.  , ANA positive at low titer (1: 80), SSA and SSB negative, RNP negative, RF negative, CCP negative, Anti-Smith negative, anti-SCL negative, DS DNA negative, anti-Jo negative, aldolase negative. She hasn't been seen in ILD clinic yet.    ROS:  -She does have fatigue for the last several years.  She does have some back and hip issues.  She does have dry eyes.  She does have  like some dysphagia.  There is presence of hiatal hernia-there is a small.  Acid reflux for several decades.  She also reports presence of nonspecific rash   FAMILY HISTORY of LUNG DISEASE: * -Her father died of mesothelioma in nineteen 83/1984 at the age of 80 otherwise no lung disease.   EXPOSURE HISTORY:   -When she was 17 she smokes cigarettes but otherwise no cigarette or tobacco use or electronic cigarette.  Never smoked marijuana.  No cocaine use no intravenous drug use.   HOME and HOBBY DETAILS :  -Single[place]family home [place] setting for the last 16 years in a 78 year old home.  No mold or mildew exposure in the [facility] duct or CPAP mask or humidifier.  No mold or mildew in the bathroom.  No pet birds in the house.  No misting [place].  No feather pillows no feather duvet.  No musical instruments.  She does some occasional gardening which she  likes.  She does do some fine-needle work.   OCCUPATIONAL HISTORY (122 questions) :  = Essentially negative except for the fact when she was a child she did some tobacco growing.  She has done home gardening for 50 years.    PULMONARY TOXICITY HISTORY (27 items):  denies  Results for KAYDEE, MAGEL (MRN [id number]) as of 11/07/2018 11:20  Ref. Range 02/26/2018 12:17  Anti Nuclear Antibody (ANA) Latest Ref Range: NEGATIVE  POSITIVE (A)  ANA Pattern 1 Unknown Nuclear, Speckled (A)  ANA Titer 1 Latest Units: titer 1:80 (H)  Anti JO-1 Latest Ref Range: 0.0 - 0.9 AI <7.4  Cyclic Citrullin Peptide Ab Latest Units: UNITS <16  ds DNA Ab Latest Units: IU/mL <1  RA Latex Turbid. Latest Ref Range: <14 IU/mL <14  ENA SM Ab Ser-aCnc Latest Ref Range: <1.0 NEG AI <1.0 NEG  Ribonucleic Protein(ENA) Antibody, IgG Latest Ref Range: <1.0 NEG AI <1.0 NEG  SSA (Ro) (ENA) Antibody, IgG Latest Ref Range: <1.0 NEG AI <1.0 NEG  SSB (La) (ENA) Antibody, IgG Latest Ref Range: <1.0 NEG AI <1.0 NEG  Scleroderma (Scl-70) (ENA) Antibody, IgG Latest Ref Range: <1.0 NEG AI  <1.0 NEG   Results for HARMONII, KARLE (MRN [id number]) as of 11/07/2018 11:20  Ref. Range 01/29/2018 14:31  FVC-Pre Latest Units: L 2.97  FVC-%Pred-Pre Latest Units: % 98  Results for AIRI, COPADO (MRN [id number]) as of 11/07/2018 11:20  Ref. Range 01/29/2018 14:31  DLCO unc Latest Units: ml/min/mmHg 14.80  DLCO unc % pred Latest Units: % 54    OV 11/07/2018  Subjective:  Patient ID: Elizabeth Espinoza, female , DOB: [birth year] , age 10 y.o. , MRN: [id number] , ADDRESS: [place] [id number]   11/07/2018 -   Chief Complaint  Patient presents with  . Follow-up    Patient reports that she has sob with any exertion.    Follow-up interstitial lung disease  HPI OLESYA WIKE 79 y.o. -last seen September 2020.  After that she was supposed to have follow-up high-resolution CT chest and spirometry DLCO.  For some reason the spirometry DLCO is not done.  In the interim her symptoms remain the same as shown by the symptom score below.  She does note when she does heavy exertion such as climbing stairs or long uphill walks her pulse ox drops to 84% but quickly regains.  She is not interested in portable oxygen.  She had a repeat high-resolution CT chest in September 2020 and when compared to January 2020 there is no significant change.  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  I personally visualized the film.  There is diffuse bilateral subpleural reticulation and some traction bronchiectasis.  I myself would say it may be this is indeterminate for UIP.  But there is some minimal air trapping and she is done some gardening work.  There is no upper zonal predominance that would fit in with hypersensitive pneumonitis.  She has incidental findings of bony sclerosis that is again repeated in the CT scan.  Her primary care physician Billey Gosling evaluated her.  I reviewed the note.  She is been referred to  Dr. Narda Rutherford in hematology who she is seeing November 12, 2018.  She is very confused about the fact that she has a bony sclerosis problem for which she is being referred to a blood doctor [hematologist] and she thought she is having  a blood test that was ordered by me.  I reviewed the chart and clarified this concepts   Ct Chest High Resolution  Result Date: 11/05/2018 CLINICAL DATA:  79 year old female with history of interstitial lung disease. Increased shortness of breath and cough over the past year. EXAM: CT CHEST WITHOUT CONTRAST TECHNIQUE: Multidetector CT imaging of the chest was performed following the standard protocol without intravenous contrast. High resolution imaging of the lungs, as well as inspiratory and expiratory imaging, was performed. COMPARISON:  High-resolution chest CT 02/10/2018. FINDINGS: Cardiovascular: Heart size is normal. There is no significant pericardial fluid, thickening or pericardial calcification. There is aortic atherosclerosis, as well as atherosclerosis of the great vessels of the mediastinum and the coronary arteries, including calcified atherosclerotic plaque in the left anterior descending and right coronary arteries. Mild calcifications of the mitral annulus. Mediastinum/Nodes: No pathologically enlarged mediastinal or hilar lymph nodes. Please note that accurate exclusion of hilar adenopathy is limited on noncontrast CT scans. Esophagus is unremarkable in appearance. No axillary lymphadenopathy. Lungs/Pleura: High-resolution images demonstrates some patchy areas of peripheral predominant septal thickening and subpleural reticulation, with mild cylindrical bronchiectasis and peripheral bronchiolectasis. No frank honeycombing confidently identified at this time. These findings have no definitive craniocaudal gradient. In the periphery of the mid to upper lungs there also some plaque-like areas of apparent pleuroparenchymal scarring and volume loss. Inspiratory and  expiratory imaging demonstrates minimal air trapping indicative of very mild small airways disease. Overall, these imaging findings appear stable compared to the prior study. Upper Abdomen: Aortic atherosclerosis. Musculoskeletal: Mild diffuse sclerosis throughout the visualized axial and appendicular skeleton, similar to prior examinations. There are no definite focal aggressive appearing lytic or blastic lesions noted in the visualized portions of the skeleton. IMPRESSION: 1. 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 2. Aortic atherosclerosis, in addition to 2 vessel coronary artery disease. Assessment for potential risk factor modification, dietary therapy or pharmacologic therapy may be warranted, if clinically indicated. 3. Persistent mild diffuse sclerosis throughout the visualized osseous structures without discrete aggressive appearing osseous lesions. Clinical correlation for signs and symptoms of potential infiltrative process such as myelofibrosis is suggested. Aortic Atherosclerosis (ICD10-I70.0). Electronically Signed   By: Vinnie Langton M.D.   On: 11/05/2018 14:36     OV 12/22/2018  Subjective:  Patient ID: Elizabeth Espinoza, female , DOB: 09-17-1940 , age 34 y.o. , MRN: [id number] , ADDRESS: [place] [id number]   12/22/2018 -   Chief Complaint  Patient presents with  . Follow-up    Pt states she has been doing good since last visit. Pt is still coughing and will get up clear mucus in the morning.   Follow-up interstitial lung disease with CT scan October 2020 being indeterminate versus alternate diagnosis.  History of gardening.  Trace autoimmune ANA  positive.  Mild progression since 2014  HPI TASMIN EXANTUS 79 y.o. -returns for follow-up.  She presents with her husband who I am meeting for the first time.  In the interim she met Dr. Roxan Hockey thoracic surgeon for surgical lung biopsy.  Her husband also met with him.  I reviewed the note.  She tells me that given the morbidity with surgical lung biopsy she wants to undergo  bronchoscopy with lavage and transbronchial biopsy first.  She understands the inherent limitations of these procedure in terms of diagnosis.  But she wants to take the lower risk profile.  Overall she feels stable.  Her symptom score is improved compared to the past.  Her walking desaturation test shows exaggerated drop in pulse ox but this was done with her wearing the mask.  She did not feel any dyspnea.  In terms of her bony sclerosis she has seen Dr. Lorenso Courier at [facility].  I reviewed the note.  He has reassured her.  Risks of pneumothorax, hemothorax, sedation/anesthesia complications such as cardiac or respiratory arrest or hypotension, stroke and bleeding all explained. Benefits of diagnosis but limitations of non-diagnosis also explained. Patient verbalized understanding and wished to proceed.   They want to have the bronchoscopy after the holidays of Christmas and new year.  This is because their house is undergoing remodeling currently.       SYMPTOM SCALE - ILD      O2 use 10/08/2018  11/07/2018  12/22/2018   Shortness of Breath 0 -> 5 scale with 5 being worst (score 6 If unable to do)  ra  At rest 0 0 0  Simple tasks - showers, clothes change, eating, shaving 1 1 0  Household (dishes, doing bed, laundry) _0 Shopping 2 2 0.5  Walking level at own pace 2 1 0.5  Walking keeping up with others of same age 30 2 0  Walking up Stairs _1 Walking up Hill 5 5 3.5  Total (40 - 48) Dyspnea Score 20 18 9.52.5  How bad is your cough? 1 3 2.5  How bad is your fatigue 2 2.5 1.5 am, 4.5 pm      OV  03/16/2019  Subjective:  Patient ID: Elizabeth Espinoza, female , DOB: 1941-01-08 , age 72 y.o. , MRN: [id number] , ADDRESS: [place] [address]   03/16/2019 -   Chief Complaint  Patient presents with  . Follow-up    PFT performed today.  Pt states she has been doing okay since last visit and states her breathing is about the same.     Finally able to review esults of [facility]ia send out test to [place]. Date of test is 02/05/2019. 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 -returns for follow-up to discuss bronchoscopy and lavage results.  In the interim no new respiratory issues.  They are all stable.  However she is having significant musculoskeletal issues with shoulder pain.  Serology from a year ago was negative.  They wanted to know about relatedness.  I told him it is probably not related.  We went over the bronchoscopy lavage results which showed some dominance of neutrophils consistent with UIP.  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   We had a long discussion about the benefits, risks and limitations of antifibrotic therapy.  We also discussed the choice between pirfenidone and nintedanib.       Results for ASHLEE, PLAYER (MRN [id number]) as of 03/16/2019 11:12  Ref. Range 01/29/2018 14:31 03/16/2019 08:42  FVC-Pre Latest Units: L 2.97 2.91  FVC-%Pred-Pre Latest Units: % 98 97   Results for ALANYS, GODINO (MRN [id number]) as of 03/16/2019 11:12  Ref. Range 01/29/2018 14:31 03/16/2019 08:42  DLCO unc Latest Units: ml/min/mmHg 14.80 12.32  DLCO unc % pred Latest Units: % 54  60     OV 06/11/2019 - telephine visit. Patient identified with 2 pHI, risks, benefit, limitations of tele visit explained  Subjective:  Patient ID: Elizabeth Espinoza, female , DOB: [birth year] , age 6 y.o. , MRN: [id number] , ADDRESS: [place] [address] ate of test is 02/05/2019. 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 -in this telephone visit patient is now on pirfenidone.  She says she is on pirfenidone since mid April 2021.  She is on 3 pills 3 times a day.  She spacing them 4 hours apart but having some intermittent nausea every few days.  She did have an urticaria but that was before she started pirfenidone was related to Covid vaccine that is now resolved.  She is asking about exercising at the [facility] and Covid precautions.  I advised her because she is vaccinated that the risk is low but not 0 and to take adequate precautions as tolerated.  She had liver function test and this is normal.  Her next appointment for pulmonary function test is in mid July 2021.  Recommended she make a face-to-face visit with me at that time.   OV 08/21/2019   Subjective:  Patient ID: Elizabeth Espinoza, female , DOB: 1940-11-14, age 57 y.o. years. , MRN: [id number],  ADDRESS: [place] [address] PCP  Binnie Rail, MD Providers : Treatment Team:  Attending Provider: Brand Males, MD  Type of visit: Telephone Circumstance: COVID-19 national emergency Identification of patient Elizabeth Espinoza - 2 person identifier Risks: Risks, benefits, limitations of telephone visit explained Patient location: home This provider location: [place] [facility]      Chief Complaint  Patient presents with  . Follow-up    PFT 7/27--c/o sob with stairs and throat clearing mainly in the morning. stopped Esbriet on 08/12/2019.   Follow-up interstitial lung disease with CT scan October 2020 being indeterminate versus alternate diagnosis.  History of gardening.  Trace autoimmune ANA positive.  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 -presents for this telephone visit for IPF.  On this telephone visit she was identified with 2 person identifier.  Risks, benefits and limitations of telephone visit explained.  After last visit her GI symptoms worsen.  She did see Dr. Young Berry gastroenterologist.  She underwent endoscopy.  Dr. Collene Mares did send the results to me I do not have this.  I remember Dr. Collene Mares calling me and discussing patient's GI side effects.  These were from pirfenidone so on August 12, 2019 based on my advise she start pirfenidone.  Since then her symptoms of GI nature have resolved.  Her respiratory symptoms continue to be stable.  These are all documented below.  At this point in time she feels that her quality of life is very important.  She does not want to go through the kind of GI side effect she went through with pirfenidone.  She would rather deal with the disease.  She is aware that nintedanib is  a standard of care therapeutic option.  She is also aware after discussion the clinical trials as a care option particularly in the past to discovering new therapies that do not have this GI side effects that pirfenidone poses.  She had a lot of questions about Covid vaccine.  She is fully vaccinated.  She is worried about her grandson was refusing the vaccine.      OV 09/29/2019   Subjective:  Patient ID: Elizabeth Espinoza, female , DOB: 1940-08-02, age 25 y.o. years. , MRN: [id number],  ADDRESS: [place] [address] PCP  Binnie Rail, MD Providers : Treatment Team:  Attending Provider: Brand Males, MD   Chief Complaint  Patient presents with  . Follow-up    LFT today?  pulmonary rehab or exercises.  inhaler trial, felt better after Advair.   Follow-up interstitial lung disease/IPF with CT scan October 2020 being indeterminate  versus alternate diagnosis.  History of gardening.  Trace autoimmune ANA positive.  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 -    presesnts  With her husband for IPF followup.  Overall she is doing well.  She is not taking pirfenidone since July 2021.  After that she has gained weight.  All her GI symptoms have resolved.  She goes for daily walks.  She says when she reaches the top of the hill sometimes she desaturates to 88% but not always.  She monitors this closely.  She is asking for other exercises that can improve her endurance and lung function.  We discussed that exercises do not improve lung function.  She is willing to participate in pulmonary rehabilitation.  We discussed the alternative of taking nintedanib but because of the severe GI side effects that she had with Esbriet she is nervous about starting nintedanib and does not want to.  I introduced her to the concept of clinical trials as a care option.  At this point in time she wants to process this information.  We did discuss the fact that IPF is a progressive disease and antifibrotic's are indicated early in the course of the disease.  She prefers to have supportive care approach.  She wants to focus on strengthening her overall physical shape.   SYMPTOM SCALE - ILD 03/16/2019  08/21/2019 Off esbriet since 08/12/19 09/29/2019 147#  O2 use ra ra   Shortness of Breath 0 -> 5 scale with 5 being worst (score 6 If unable to do)    At rest 0 0   Simple tasks - showers, clothes change, eating, shaving 1    Household (dishes, doing bed, laundry) 4 Do not notice othe than rushing aorund   Shopping 3    Walking level at own pace 2    Walking up Stairs 5 5   Total (30-36) Dyspnea  Score 15    How bad is your cough? 0 Only very mild in the morning   How bad is your fatigue 0 4.5 on esbriet-> 0.5 now   How bad is nausea 0 5 during esbrit -> 0 now. Was  Getting gagging thinking of food on esbriet   How bad is vomiting?  0 Had some vomit during  esbriet -> none since stopping esbriet   How bad is diarrhea? 0 0 at all time   How bad is anxiety? 1.5 0   How bad is depression 0          Simple office walk 185 feet x  3 laps goal with forehead probe 10/08/2018  11/07/2018  12/22/2018  03/16/2019   O2 used ra ra ra - wor mask ra  Number laps completed _0 Comments about pace good fast Mod pace Fast pace with mask  Resting Pulse Ox/HR 98% and 77/min 100% and 62/min 97% ad 70.min 99% and 59 min  Final Pulse Ox/HR 95% and 96/min 96% and 105/min 90%and 110/mi 96% and 119  Desaturated </= 88% no no no no  Desaturated <= 3% points yes Yes, 4 ppoints Yes, 7 ponits Yes, 3 points  Got Tachycardic >/= 90/min yes yes yes   Symptoms at end of test none none none Mild dysnea  Miscellaneous comments x  ? Mask related worsening     PFT Results Latest Ref Rng & Units 09/15/2019 03/16/2019 01/29/2018  FVC-Pre L 2.92 2.91 2.97  FVC-Predicted Pre % 99 97 98  FVC-Post L - - 2.89  FVC-Predicted Post % - - 95  Pre FEV1/FVC % % 80 79 81  Post FEV1/FCV % % - - 83  FEV1-Pre L 2.34 2.29 2.40  FEV1-Predicted Pre % 106 102 105  FEV1-Post L - - 2.39  DLCO uncorrected ml/min/mmHg 12.84 12.32 14.80  DLCO UNC% % 63 60 54  DLCO corrected ml/min/mmHg 13.26 12.91 -  DLCO COR %Predicted % 65 63 -  DLVA Predicted % 71 73 68     ROS - per HPI     ROS - per HPI     has a past medical history of Allergic rhinitis, GERD (gastroesophageal reflux disease), Glaucoma, Hiatal hernia, Hyperlipidemia, Hypothyroidism, ILD (interstitial lung disease) ([place]), Lichen planopilaris, MVP (mitral valve prolapse), Restless legs, and Urticaria.   reports that she has never smoked. She has never used  smokeless tobacco.  Past Surgical History:  Procedure Laterality Date  . APPENDECTOMY     with TAH  . CARDIAC CATHETERIZATION  1989   negative  . COLONOSCOPY  2005   Dr Olevia Perches  . COLONOSCOPY  01/2013   Dr Collene Mares  . esophageal dilation  07/04/2011   Dr Olevia Perches  . FLEXIBLE SIGMOIDOSCOPY  1999    Dr Olevia Perches  . KNEE ARTHROSCOPY  2001, 2005   Dr Percell Miller   bilat  . TOTAL ABDOMINAL HYSTERECTOMY     for Endometriosis; no BSO, Dr Colin Ina  . UPPER GI ENDOSCOPY  01/2013   Dr Collene Mares  . VIDEO BRONCHOSCOPY Bilateral 02/05/2019   Procedure: VIDEO BRONCHOSCOPY WITH FLUORO;  Surgeon: Brand Males, MD;  Location: [facility];  Service: Endoscopy;  Laterality: Bilateral;    Allergies  Allergen Reactions  . Caffeine     palpitations  . Chlorpheniramine-Pseudoeph     palpitations  . Maxifed     palpitations    Immunization History  Administered Date(s) Administered  . Influenza Split 11/07/2010  . Influenza Whole 10/30/2009, 11/27/2011  . Influenza, High Dose Seasonal PF 10/27/2012, 11/05/2013, 10/01/2017, 09/11/2018  . Influenza-Unspecified 11/24/2014, 10/07/2015, 10/08/2016  . PFIZER SARS-COV-2 Vaccination 03/14/2019, 04/06/2019  . Pneumococcal Conjugate-13 10/07/2015  . Pneumococcal Polysaccharide-23 07/02/2017  . Tdap 02/11/2012  . Zoster 01/09/2012  . Zoster Recombinat (Shingrix) 10/01/2017, 12/03/2017    Family History  Problem Relation Age of Onset  .  Lung cancer Father        Asbestos with mesothelioma  . Heart disease Paternal Grandmother        ? etiology  . Diabetes Paternal Grandmother   . Diabetes Brother   . Uterine cancer Paternal Aunt   . Stroke Paternal Grandfather        > 39  . Heart attack Brother 44  . Allergies Sister   . Aneurysm Mother        congenital  . Stroke Brother        two strokes  . Barrett's esophagus Brother      Current Outpatient Medications:  .  aspirin EC 81 MG tablet, Take 1 tablet (81 mg total) by mouth daily., Disp: 90  tablet, Rfl: 3 .  Biotin 5000 MCG TABS, Take 5,000 mcg by mouth daily., Disp: , Rfl:  .  camphor-menthol (SARNA) lotion, Apply 1 application topically as needed for itching., Disp: , Rfl:  .  Carboxymethylcellul-Glycerin (REFRESH RELIEVA OP), Place 1 drop into both eyes every 6 (six) hours as needed (dry eyes)., Disp: , Rfl:  .  cetirizine (ZYRTEC) 10 MG tablet, Take 10 mg by mouth 2 (two) times daily., Disp: , Rfl:  .  cholecalciferol (VITAMIN D3) 25 MCG (1000 UT) tablet, Take 1,000 Units by mouth daily., Disp: , Rfl:  .  clobetasol (TEMOVATE) 0.05 % external solution, SMARTSIG:1 Milliliter(s) Topical Twice Daily, Disp: , Rfl:  .  esomeprazole (NEXIUM) 40 MG capsule, Take 40 mg by mouth every morning., Disp: , Rfl:  .  famotidine (PEPCID) 40 MG tablet, Take 1 tablet (40 mg total) by mouth daily., Disp: 90 tablet, Rfl: 1 .  fluticasone (FLONASE) 50 MCG/ACT nasal spray, Place 1 spray into both nostrils daily as needed for allergies or rhinitis., Disp: , Rfl:  .  hydroxychloroquine (PLAQUENIL) 200 MG tablet, Take 200 mg by mouth 2 (two) times daily., Disp: , Rfl:  .  hydrOXYzine (ATARAX/VISTARIL) 25 MG tablet, Take 25 mg by mouth 3 (three) times daily., Disp: , Rfl:  .  levothyroxine (SYNTHROID) 100 MCG tablet, Take 1 tablet (100 mcg total) by mouth daily., Disp: 90 tablet, Rfl: 3 .  magnesium oxide (MAG-OX) 400 MG tablet, Take 400 mg by mouth daily., Disp: , Rfl:  .  Multiple Vitamins-Minerals (ZINC PO), Take 1 tablet by mouth., Disp: , Rfl:  .  pravastatin (PRAVACHOL) 40 MG tablet, TAKE 1 TABLET BY MOUTH  DAILY, Disp: 90 tablet, Rfl: 3 .  rOPINIRole (REQUIP) 0.5 MG tablet, TAKE 2 TABLETS BY MOUTH  AFTER DINNER EACH NIGHT., Disp: 180 tablet, Rfl: 3      Objective:   Vitals:   09/29/19 1129  BP: 130/60  Pulse: 72  Temp: 97.6 F (36.4 C)  TempSrc: Temporal  SpO2: 98%  Weight: 147 lb 12.8 oz (67 kg)  Height: _0  (1.676 m)    Estimated body mass index is 23.86 kg/m as calculated from  the following:   Height as of this encounter: _1  (1.676 m).   Weight as of this encounter: 147 lb 12.8 oz (67 kg).  _2 @  [employer]   09/29/19 1129  Weight: 147 lb 12.8 oz (67 kg)     Physical Exam  General Appearance:    Alert, cooperative, no distress, appears stated age - yes , Deconditioned looking - no , OBESE  - no, Sitting on Wheelchair -  no  Head:    Normocephalic, without obvious abnormality, atraumatic  Eyes:    PERRL,  conjunctiva/corneas clear,  Ears:    Normal TM's and external ear canals, both ears  Nose:   Nares normal, septum midline, mucosa normal, no drainage    or sinus tenderness. OXYGEN ON  - no . Patient is @ ra   Throat:   Lips, mucosa, and tongue normal; teeth and gums normal. Cyanosis on lips - no  Neck:   Supple, symmetrical, trachea midline, no adenopathy;    thyroid:  no enlargement/tenderness/nodules; no carotid   bruit or JVD  Back:     Symmetric, no curvature, ROM normal, no CVA tenderness  Lungs:     Distress - no , Wheeze no, Barrell Chest - no, Purse lip breathing - no, Crackles - no   Chest Wall:    No tenderness or deformity.    Heart:    Regular rate and rhythm, S1 and S2 normal, no rub   or gallop, Murmur - no  Breast Exam:    NOT DONE  Abdomen:     Soft, non-tender, bowel sounds active all four quadrants,    no masses, no organomegaly. Visceral obesity - no  Genitalia:   NOT DONE  Rectal:   NOT DONE  Extremities:   Extremities - normal, Has Cane - no, Clubbing - no, Edema - no  Pulses:   2+ and symmetric all extremities  Skin:   Stigmata of Connective Tissue Disease - no  Lymph nodes:   Cervical, supraclavicular, and axillary nodes normal  Psychiatric:  Neurologic:   Pleasant - yes, Anxious - no, Flat affect - no  CAm-ICU - neg, Alert and Oriented x 3 - yes, Moves all 4s - yes, Speech - normal, Cognition - intact           Assessment:       ICD-10-CM   1. IPF (idiopathic pulmonary fibrosis) ([place])  J84.112   2.  Encounter for medication review and counseling  Z71.89        Plan:     Patient Instructions     ICD-10-CM   1. IPF (idiopathic pulmonary fibrosis) ([place])  J84.112   2. Encounter for medication review and counseling  Z71.89     IPF is stable since last visit and on breathing test 09 March 2019 in August 2021 although you have declined since January 2020   Severe GI side effects with esbriet - have resolved after stopping esbriet  Respective concerned that side effects can happen with nintedanib the next alternative  Plan  - list Esbriet as allergy (nausea, fatigue) - disussed ofev as next option but at this point support your decision to just follow supportive care  -Discussed clinical trials as a care option -   -Visit our website [email or web address] to learn more about clinical trials -Continue daily exercises -Refer to pulmonary rehabilitation at [place] can use the breathing respiratory muscle trainer device [I printed this out] -Visit website [email or web address] an e-mail [email or web address]  - Mr Hildred Alamin -he is a local support group leader  -Do high-resolution CT chest supine and prone in 4 months -Do repeat spirometry and DLCO in 4 months   Followup  - 30 min visit to see Dr Chase Caller - face to face -in 4 months but after spirometry and DLCO   (Level 04: Estb 30-39 min   visit type: on-site physical face to visit visit spent in total care time and counseling or/and coordination of care by this undersigned MD - Dr Brand Males. This includes one or more of the  following on this same day 09/29/2019: pre-charting, chart review, note writing, documentation discussion of test results, diagnostic or treatment recommendations, prognosis, risks and benefits of management options, instructions, education, compliance or risk-factor reduction. It excludes time spent by the [place] or office staff in the care of the patient . Actual time is 31 min)   SIGNATURE     Dr. Brand Males, M.D., F.C.C.P,  Pulmonary and Critical Care Medicine Staff Physician, [place] Director - Interstitial Lung Disease  Program  Pulmonary [place] at [place], [place], [address]  Pager: [phone], If no answer or between  15:00h - 7:00h: call [phone] Telephone: [phone]  12:13 PM 09/29/2019

## 2019-10-05 DIAGNOSIS — M542 Cervicalgia: Secondary | ICD-10-CM | POA: Diagnosis not present

## 2019-10-05 DIAGNOSIS — M25511 Pain in right shoulder: Secondary | ICD-10-CM | POA: Diagnosis not present

## 2019-10-05 DIAGNOSIS — M25512 Pain in left shoulder: Secondary | ICD-10-CM | POA: Diagnosis not present

## 2019-10-09 NOTE — Telephone Encounter (Signed)
She is high risk but not immune suppressed -> so at this time booster is NOT formally indicated though the CDC has recommended it it for people like her -> the FDA is deliberating this month and we will know soon probabaly before 20th sept  Plan  - given fact she has rash or something best we wait for formal recommendation   - ALSO, she can check a COVID IgG in our lab -> certainly if is positive, it can tell us she can wait on booster . If is negative, though it might be false negative, might encourage [place] to tell her to have booster    Immunization History  Administered Date(s) Administered  . Influenza Split 11/07/2010  . Influenza Whole 10/30/2009, 11/27/2011  . Influenza, High Dose Seasonal PF 10/27/2012, 11/05/2013, 10/01/2017, 09/11/2018  . Influenza-Unspecified 11/24/2014, 10/07/2015, 10/08/2016  . PFIZER SARS-COV-2 Vaccination 03/14/2019, 04/06/2019  . Pneumococcal Conjugate-13 10/07/2015  . Pneumococcal Polysaccharide-23 07/02/2017  . Tdap 02/11/2012  . Zoster 01/09/2012  . Zoster Recombinat (Shingrix) 10/01/2017, 12/03/2017     Allergies  Allergen Reactions  . Caffeine     palpitations  . Chlorpheniramine-Pseudoeph     palpitations  . Maxifed     palpitations  . Pirfenidone Nausea Only    Esbriet Causes nausea and weakness    Current Outpatient Medications:  .  aspirin EC 81 MG tablet, Take 1 tablet (81 mg total) by mouth daily., Disp: 90 tablet, Rfl: 3 .  Biotin 5000 MCG TABS, Take 5,000 mcg by mouth daily., Disp: , Rfl:  .  camphor-menthol (SARNA) lotion, Apply 1 application topically as needed for itching., Disp: , Rfl:  .  Carboxymethylcellul-Glycerin (REFRESH RELIEVA OP), Place 1 drop into both eyes every 6 (six) hours as needed (dry eyes)., Disp: , Rfl:  .  cetirizine (ZYRTEC) 10 MG tablet, Take 10 mg by mouth 2 (two) times daily., Disp: , Rfl:  .  cholecalciferol (VITAMIN D3) 25 MCG (1000 UT) tablet, Take 1,000 Units by mouth daily., Disp: , Rfl:  .   clobetasol (TEMOVATE) 0.05 % external solution, SMARTSIG:1 Milliliter(s) Topical Twice Daily, Disp: , Rfl:  .  esomeprazole (NEXIUM) 40 MG capsule, Take 40 mg by mouth every morning., Disp: , Rfl:  .  famotidine (PEPCID) 40 MG tablet, Take 1 tablet (40 mg total) by mouth daily., Disp: 90 tablet, Rfl: 1 .  fluticasone (FLONASE) 50 MCG/ACT nasal spray, Place 1 spray into both nostrils daily as needed for allergies or rhinitis., Disp: , Rfl:  .  hydroxychloroquine (PLAQUENIL) 200 MG tablet, Take 200 mg by mouth 2 (two) times daily., Disp: , Rfl:  .  hydrOXYzine (ATARAX/VISTARIL) 25 MG tablet, Take 25 mg by mouth 3 (three) times daily., Disp: , Rfl:  .  levothyroxine (SYNTHROID) 100 MCG tablet, Take 1 tablet (100 mcg total) by mouth daily., Disp: 90 tablet, Rfl: 3 .  magnesium oxide (MAG-OX) 400 MG tablet, Take 400 mg by mouth daily., Disp: , Rfl:  .  Multiple Vitamins-Minerals (ZINC PO), Take 1 tablet by mouth., Disp: , Rfl:  .  pravastatin (PRAVACHOL) 40 MG tablet, TAKE 1 TABLET BY MOUTH  DAILY, Disp: 90 tablet, Rfl: 3 .  rOPINIRole (REQUIP) 0.5 MG tablet, TAKE 2 TABLETS BY MOUTH  AFTER DINNER EACH NIGHT., Disp: 180 tablet, Rfl: 3   Past Medical History:  Diagnosis Date  . Allergic rhinitis   . GERD (gastroesophageal reflux disease)   . Glaucoma    SUSPECT  . Hiatal hernia   . Hyperlipidemia  LDL goal = < 100  . Hypothyroidism   . ILD (interstitial lung disease) ([place])   . Lichen planopilaris   . MVP (mitral valve prolapse)    documented on 2 D ECHO  . Restless legs   . Urticaria

## 2019-10-09 NOTE — Telephone Encounter (Signed)
MR: please advise on pt email, thanks  I forgot to ask about the Covid booster- I had 1st shot Feb. 20th and 2nd shot in March. I had hives for 3 weeks. Is there any reason I should not take it? If not when should I get It. Thank you

## 2019-10-19 ENCOUNTER — Telehealth: Payer: Self-pay | Admitting: Internal Medicine

## 2019-10-19 DIAGNOSIS — Z887 Allergy status to serum and vaccine status: Secondary | ICD-10-CM

## 2019-10-19 NOTE — Telephone Encounter (Signed)
Patient requested to have Ige for covid drawn as you recommended. Order placed.

## 2019-10-19 NOTE — Telephone Encounter (Signed)
She is Dr. Golden Pop patient [place] chart to him.

## 2019-10-20 ENCOUNTER — Other Ambulatory Visit: Payer: Medicare Other

## 2019-10-20 DIAGNOSIS — Z887 Allergy status to serum and vaccine status: Secondary | ICD-10-CM

## 2019-10-20 NOTE — Addendum Note (Signed)
Addended by: Suzzanne Cloud E on: 10/20/2019 10:39 AM   Modules accepted: Orders

## 2019-10-20 NOTE — Addendum Note (Signed)
Addended by: Tery Sanfilippo R on: 10/20/2019 10:32 AM   Modules accepted: Orders

## 2019-10-20 NOTE — Addendum Note (Signed)
Addended by: Lia Foyer R on: 10/20/2019 10:26 AM   Modules accepted: Orders

## 2019-10-20 NOTE — Addendum Note (Signed)
Addended by: Tery Sanfilippo R on: 10/20/2019 10:39 AM   Modules accepted: Orders

## 2019-10-21 NOTE — Telephone Encounter (Signed)
Called and spoke with patient and gave information per Dr. Chase Caller.  She verbalized understanding.  Dr. Chase Caller, she is asking how she can get involved in the research study with [facility].  Please advise.  Thank you.

## 2019-10-21 NOTE — Telephone Encounter (Signed)
  It is now 6 + months since she got her covid pfizer vaccine. She should consider getting BOOSTER for covid    Also, please check if she was able to visit [email or web address] and email Mr Marlane Mingle support group leader.      SIGNATURE    Dr. Brand Males, M.D., F.C.C.P,  Pulmonary and Critical Care Medicine Staff Physician, [place] Director - Interstitial Lung Disease  Program  Pulmonary [place] at [place], [place], [address]  Pager: [phone], If no answer  OR between  19:00-7:00h: page [phone] Telephone (clinical office): [phone] Telephone (research): [phone]  2:19 PM 10/21/2019    Immunization History  Administered Date(s) Administered  . Influenza Split 11/07/2010  . Influenza Whole 10/30/2009, 11/27/2011  . Influenza, High Dose Seasonal PF 10/27/2012, 11/05/2013, 10/01/2017, 09/11/2018  . Influenza-Unspecified 11/24/2014, 10/07/2015, 10/08/2016  . PFIZER SARS-COV-2 Vaccination 03/14/2019, 04/06/2019  . Pneumococcal Conjugate-13 10/07/2015  . Pneumococcal Polysaccharide-23 07/02/2017  . Tdap 02/11/2012  . Zoster 01/09/2012  . Zoster Recombinat (Shingrix) 10/01/2017, 12/03/2017

## 2019-10-22 LAB — SARS-COV-2 ANTIBODY, IGM: SARS-CoV-2 Antibody, IgM: NEGATIVE

## 2019-10-26 ENCOUNTER — Telehealth: Payer: Self-pay | Admitting: Internal Medicine

## 2019-10-26 NOTE — Telephone Encounter (Signed)
Spoke with pt and reviewed [facility] information. Pt stated understanding. Nothing further needed at this time.

## 2019-10-26 NOTE — Telephone Encounter (Signed)
Only covid IgM was done despite my note mid sept asking for COvif IgG. In any event pls let her know this is negative (shor teerm antibody response) and current recommendation is for her to have covid booster based on IPF and age > 26. She should have it

## 2019-10-26 NOTE — Telephone Encounter (Signed)
Called and spoke with pt letting her know the info stated by MR and she verbalized understanding. Pt said she has an appt to receive her booster vaccine tomorrow 10/5. Nothing further needed.

## 2019-10-26 NOTE — Telephone Encounter (Signed)
Please let her know that over next few to several weeks someone from [facility] will reach out to her. If she does not hear she can call them directly [phone] and ask for Gerline Legacy

## 2019-10-27 ENCOUNTER — Ambulatory Visit: Payer: Medicare Other | Attending: Internal Medicine

## 2019-10-27 DIAGNOSIS — Z23 Encounter for immunization: Secondary | ICD-10-CM

## 2019-10-27 NOTE — Progress Notes (Signed)
   Covid-19 Vaccination Clinic  Name:  LAMAR NAEF    MRN: [id number] DOB: September 27, 1940  10/27/2019  Ms. Azpeitia was observed post Covid-19 immunization for 15 minutes without incident. She was provided with Vaccine Information Sheet and instruction to access the V-Safe system.   Ms. Tello was instructed to call 911 with any severe reactions post vaccine: Marland Kitchen Difficulty breathing  . Swelling of face and throat  . A fast heartbeat  . A bad rash all over body  . Dizziness and weakness

## 2019-11-04 DIAGNOSIS — M25511 Pain in right shoulder: Secondary | ICD-10-CM | POA: Diagnosis not present

## 2019-11-04 DIAGNOSIS — M25512 Pain in left shoulder: Secondary | ICD-10-CM | POA: Diagnosis not present

## 2019-11-04 DIAGNOSIS — M542 Cervicalgia: Secondary | ICD-10-CM | POA: Diagnosis not present

## 2019-11-06 ENCOUNTER — Other Ambulatory Visit: Payer: Self-pay

## 2019-11-06 ENCOUNTER — Encounter (HOSPITAL_COMMUNITY)
Admission: RE | Admit: 2019-11-06 | Discharge: 2019-11-06 | Disposition: A | Discharge status: Home / Self Care | Payer: Medicare Other | Source: Ambulatory Visit | Attending: Internal Medicine | Admitting: Internal Medicine

## 2019-11-06 ENCOUNTER — Encounter (HOSPITAL_COMMUNITY): Payer: Self-pay

## 2019-11-06 VITALS — BP 124/70 | HR 80 | Resp 18 | Ht 66.25 in | Wt 149.0 lb

## 2019-11-06 DIAGNOSIS — J84112 Idiopathic pulmonary fibrosis: Secondary | ICD-10-CM

## 2019-11-06 NOTE — Progress Notes (Signed)
Elizabeth Espinoza 79 y.o. female Pulmonary Rehab Orientation Note Elizabeth Espinoza who was referred to Pulmonary rehab by Dr. Chase Caller with the diagnosis of IPF arrived today in Cardiac and Pulmonary Rehab. She arrived ambulatory with steady gait. She does not carry portable oxygen.  Per pt, she uses oxygen never. Color good, skin warm and dry. Patient is oriented to time and place. Patient's medical history, psychosocial health, and medications reviewed. Psychosocial assessment reveals pt lives with their spouse. Pt is currently retired from [employer] Pt hobbies include reading. Pt reports her stress level is non existent normal everyday stress. Pt does not exhibit signs of depression, however she  difficulty maintaining sleep and avoids caffeine . PHQ2/9 score 0/6. Pt shows good  coping skills with positive outlook .  Will continue to monitor and evaluate progress toward psychosocial goal of desire to have more energy and improve lung function . Pt has good support of her husband of 20+ years along with daughter who lives in in [place]. Physical assessment reveals heart rate is normal, breath sounds with expected crackles bilateral bases. Grip strength equal, strong. Distal pulses palpable with no swelling. Patient reports she does take medications as prescribed. Patient states she follows a Diabetic diet due to A1C 6.1. The patient reports no specific efforts to gain or lose weight. pt with the weight that she is presently. Patient's weight will be monitored closely. Demonstration and practice of PLB using pulse oximeter. Patient able to return demonstration satisfactorily. Safety and hand hygiene in the exercise area reviewed with patient. Patient voices understanding of the information reviewed. Department expectations discussed with patient and achievable goals were set. The patient shows enthusiasm about attending the program and we look forward to working with this nice lady. Elizabeth Espinoza completed  6 min walk  test. Pt with desaturation to 84 however wearing a "thick" mask that was difficult for her to breathe ( she does not wear a mask when she is walking outside  2 1/2 miles a day. Will monitor saturation with exercise (as pt only exercise is walking unclear with non weight bearing activities cause decrease in oxygen saturation. 45 minutes was spent on a variety of activities such as assessment of the patient, obtaining baseline data including height, weight, BMI, and grip strength, verifying medical history, allergies, and current medications, and teaching patient strategies for performing tasks with less respiratory effort with emphasis on pursed lip breathing. Cherre Huger, BSN Cardiac and Training and development officer

## 2019-11-06 NOTE — Progress Notes (Signed)
Pulmonary Individual Treatment Plan  Patient Details  Name: Elizabeth Espinoza MRN: [id number] Date of Birth: 12-11-1940 Referring Provider:     Pulmonary Rehab Walk Test from 11/06/2019 in [facility]  Referring Provider Dr. Chase Caller      Initial Encounter Date:    Pulmonary Rehab Walk Test from 11/06/2019 in [facility]  Date 11/06/19      Visit Diagnosis: IPF (idiopathic pulmonary fibrosis) ([place])  Patient's Home Medications on Admission:   Current Outpatient Medications:  .  aspirin EC 81 MG tablet, Take 1 tablet (81 mg total) by mouth daily., Disp: 90 tablet, Rfl: 3 .  betamethasone, augmented, (DIPROLENE) 0.05 % lotion, Apply 1 application topically 2 (two) times daily., Disp: , Rfl:  .  Biotin 5000 MCG TABS, Take 5,000 mcg by mouth daily., Disp: , Rfl:  .  Carboxymethylcellul-Glycerin (REFRESH RELIEVA OP), Place 1 drop into both eyes every 6 (six) hours as needed (dry eyes)., Disp: , Rfl:  .  cetirizine (ZYRTEC) 10 MG tablet, Take 10 mg by mouth daily. , Disp: , Rfl:  .  cholecalciferol (VITAMIN D3) 25 MCG (1000 UT) tablet, Take 1,000 Units by mouth daily., Disp: , Rfl:  .  esomeprazole (NEXIUM) 40 MG capsule, Take 40 mg by mouth every morning., Disp: , Rfl:  .  famotidine (PEPCID) 40 MG tablet, Take 1 tablet (40 mg total) by mouth daily., Disp: 90 tablet, Rfl: 1 .  fluticasone (FLONASE) 50 MCG/ACT nasal spray, Place 1 spray into both nostrils daily as needed for allergies or rhinitis., Disp: , Rfl:  .  hydroxychloroquine (PLAQUENIL) 200 MG tablet, Take 200 mg by mouth 2 (two) times daily., Disp: , Rfl:  .  levothyroxine (SYNTHROID) 100 MCG tablet, Take 1 tablet (100 mcg total) by mouth daily., Disp: 90 tablet, Rfl: 3 .  magnesium oxide (MAG-OX) 400 MG tablet, Take 400 mg by mouth daily., Disp: , Rfl:  .  Multiple Vitamins-Minerals (ZINC PO), Take 1 tablet by mouth., Disp: , Rfl:  .  pravastatin (PRAVACHOL) 40 MG  tablet, TAKE 1 TABLET BY MOUTH  DAILY, Disp: 90 tablet, Rfl: 3 .  rOPINIRole (REQUIP) 0.5 MG tablet, TAKE 2 TABLETS BY MOUTH  AFTER DINNER EACH NIGHT., Disp: 180 tablet, Rfl: 3 .  camphor-menthol (SARNA) lotion, Apply 1 application topically as needed for itching. (Patient not taking: Reported on 11/06/2019), Disp: , Rfl:  .  clobetasol (TEMOVATE) 0.05 % external solution, SMARTSIG:1 Milliliter(s) Topical Twice Daily (Patient not taking: Reported on 11/06/2019), Disp: , Rfl:  .  hydrOXYzine (ATARAX/VISTARIL) 25 MG tablet, Take 25 mg by mouth 3 (three) times daily. (Patient not taking: Reported on 11/06/2019), Disp: , Rfl:   Past Medical History: Past Medical History:  Diagnosis Date  . Allergic rhinitis   . GERD (gastroesophageal reflux disease)   . Glaucoma    SUSPECT  . Hiatal hernia   . Hyperlipidemia    LDL goal = < 100  . Hypothyroidism   . ILD (interstitial lung disease) ([place])   . Lichen planopilaris   . MVP (mitral valve prolapse)    documented on 2 D ECHO  . Restless legs   . Urticaria     Tobacco Use: Social History   Tobacco Use  Smoking Status Never Smoker  Smokeless Tobacco Never Used    Labs: Recent Review Flowsheet Data    Labs for ITP Cardiac and Pulmonary Rehab Latest Ref Rng & Units 06/13/2015 06/27/2016 07/02/2017 07/04/2018 07/06/2019  Cholestrol 0 - 200 mg/dL - 160 156 168 168   LDLCALC 0 - 99 mg/dL - 84 80 88 85   HDL >39.00 mg/dL - 54.60 43.10 52.30 49.40   Trlycerides 0 - 149 mg/dL - 109.0 164.0(H) 136.0 165.0(H)   Hemoglobin A1c 4.6 - 6.5 % 6.1 6.0 6.0 6.2 6.1      Capillary Blood Glucose: No results found for: GLUCAP   Pulmonary Assessment Scores:  Pulmonary Assessment Scores    Row Name 11/06/19 1549         ADL UCSD   ADL Phase Entry     SOB Score total 51       CAT Score   CAT Score 20       mMRC Score   mMRC Score 2           UCSD: Self-administered rating of dyspnea associated with activities of daily living (ADLs) 6-point  scale (0 = "not at all" to 5 = "maximal or unable to do because of breathlessness")  Scoring Scores range from 0 to 120.  Minimally important difference is 5 units  CAT: CAT can identify the health impairment of COPD patients and is better correlated with disease progression.  CAT has a scoring range of zero to 40. The CAT score is classified into four groups of low (less than 10), medium (10 - 20), high (21-30) and very high (31-40) based on the impact level of disease on health status. A CAT score over 10 suggests significant symptoms.  A worsening CAT score could be explained by an exacerbation, poor medication adherence, poor inhaler technique, or progression of COPD or comorbid conditions.  CAT MCID is 2 points  mMRC: mMRC (Modified Medical Research Council) Dyspnea Scale is used to assess the degree of baseline functional disability in patients of respiratory disease due to dyspnea. No minimal important difference is established. A decrease in score of 1 point or greater is considered a positive change.   Pulmonary Function Assessment:   Exercise Target Goals: Exercise Program Goal: Individual exercise prescription set using results from initial 6 min walk test and THRR while considering  patient's activity barriers and safety.   Exercise Prescription Goal: Initial exercise prescription builds to 30-45 minutes a day of aerobic activity, 2-3 days per week.  Home exercise guidelines will be given to patient during program as part of exercise prescription that the participant will acknowledge.  Activity Barriers & Risk Stratification:  Activity Barriers & Cardiac Risk Stratification - 11/06/19 1350      Activity Barriers & Cardiac Risk Stratification   Cardiac Risk Stratification Moderate           6 Minute Walk:  6 Minute Walk    Row Name 11/06/19 1552         6 Minute Walk   Phase Initial     Distance 1412 feet     Walk Time 6 minutes     # of Rest Breaks 2  Had to stop  her twice because her oxygen saturation dropped below 88%     MPH 2.67     METS 3.21     RPE 11     Perceived Dyspnea  1     VO2 Peak 11.24     Symptoms Yes (comment)  Oxygen saturation dropped to 83%     Resting HR 80 bpm     Resting BP 124/70     Resting Oxygen Saturation  99 %     Exercise  Oxygen Saturation  during 6 min walk 83 %     Max Ex. HR 117 bpm     Max Ex. BP 154/70     2 Minute Post BP 116/68       Interval HR   1 Minute HR 78     2 Minute HR 105     3 Minute HR 114     4 Minute HR 114     5 Minute HR 117     6 Minute HR 116     2 Minute Post HR 72     Interval Heart Rate? Yes       Interval Oxygen   Interval Oxygen? Yes     Baseline Oxygen Saturation % 99 %     1 Minute Oxygen Saturation % 84 %     1 Minute Liters of Oxygen 0 L     2 Minute Oxygen Saturation % 83 %     2 Minute Liters of Oxygen 0 L     3 Minute Oxygen Saturation % 85 %     3 Minute Liters of Oxygen 0 L     4 Minute Oxygen Saturation % 88 %     4 Minute Liters of Oxygen 0 L     5 Minute Oxygen Saturation % 91 %     5 Minute Liters of Oxygen 0 L     6 Minute Oxygen Saturation % 87 %     6 Minute Liters of Oxygen 0 L     2 Minute Post Oxygen Saturation % 100 %     2 Minute Post Liters of Oxygen 0 L            Oxygen Initial Assessment:  Oxygen Initial Assessment - 11/06/19 1627      Home Oxygen   Home Oxygen Device None    Sleep Oxygen Prescription None    Home Exercise Oxygen Prescription None    Home Resting Oxygen Prescription None      Initial 6 min Walk   Oxygen Used None      Program Oxygen Prescription   Program Oxygen Prescription None      Intervention   Short Term Goals To learn and exhibit compliance with exercise, home and travel O2 prescription;To learn and understand importance of monitoring SPO2 with pulse oximeter and demonstrate accurate use of the pulse oximeter.;To learn and understand importance of maintaining oxygen saturations>88%;To learn and demonstrate  proper pursed lip breathing techniques or other breathing techniques.;To learn and demonstrate proper use of respiratory medications    Long  Term Goals Compliance with respiratory medication;Exhibits proper breathing techniques, such as pursed lip breathing or other method taught during program session;Maintenance of O2 saturations>88%;Verbalizes importance of monitoring SPO2 with pulse oximeter and return demonstration;Exhibits compliance with exercise, home and travel O2 prescription           Oxygen Re-Evaluation:   Oxygen Discharge (Final Oxygen Re-Evaluation):   Initial Exercise Prescription:  Initial Exercise Prescription - 11/06/19 1600      Date of Initial Exercise RX and Referring Provider   Date 11/06/19    Referring Provider Dr. Chase Caller    Expected Discharge Date 01/07/20      Treadmill   Minutes 15      Bike   Level 2    Minutes 15      Prescription Details   Frequency (times per week) 2    Duration Progress to 30 minutes of continuous aerobic without  signs/symptoms of physical distress      Intensity   THRR 40-80% of Max Heartrate 56-113    Ratings of Perceived Exertion 11-13    Perceived Dyspnea 0-4      Progression   Progression Continue to progress workloads to maintain intensity without signs/symptoms of physical distress.      Resistance Training   Training Prescription Yes    Weight orange bands    Reps 10-15           Perform Capillary Blood Glucose checks as needed.  Exercise Prescription Changes:   Exercise Comments:   Exercise Goals and Review:  Exercise Goals    Row Name 11/06/19 1551             Exercise Goals   Increase Physical Activity Yes       Intervention Provide advice, education, support and counseling about physical activity/exercise needs.;Develop an individualized exercise prescription for aerobic and resistive training based on initial evaluation findings, risk stratification, comorbidities and participant's  personal goals.       Expected Outcomes Short Term: Attend rehab on a regular basis to increase amount of physical activity.;Long Term: Add in home exercise to make exercise part of routine and to increase amount of physical activity.;Long Term: Exercising regularly at least 3-5 days a week.       Increase Strength and Stamina Yes       Intervention Provide advice, education, support and counseling about physical activity/exercise needs.;Develop an individualized exercise prescription for aerobic and resistive training based on initial evaluation findings, risk stratification, comorbidities and participant's personal goals.       Expected Outcomes Short Term: Increase workloads from initial exercise prescription for resistance, speed, and METs.;Short Term: Perform resistance training exercises routinely during rehab and add in resistance training at home;Long Term: Improve cardiorespiratory fitness, muscular endurance and strength as measured by increased METs and functional capacity (6MWT)       Able to understand and use rate of perceived exertion (RPE) scale Yes       Intervention Provide education and explanation on how to use RPE scale       Expected Outcomes Short Term: Able to use RPE daily in rehab to express subjective intensity level;Long Term:  Able to use RPE to guide intensity level when exercising independently       Able to understand and use Dyspnea scale Yes       Intervention Provide education and explanation on how to use Dyspnea scale       Expected Outcomes Short Term: Able to use Dyspnea scale daily in rehab to express subjective sense of shortness of breath during exertion;Long Term: Able to use Dyspnea scale to guide intensity level when exercising independently       Knowledge and understanding of Target Heart Rate Range (THRR) Yes       Intervention Provide education and explanation of THRR including how the numbers were predicted and where they are located for reference        Expected Outcomes Short Term: Able to state/look up THRR;Long Term: Able to use THRR to govern intensity when exercising independently;Short Term: Able to use daily as guideline for intensity in rehab       Understanding of Exercise Prescription Yes       Intervention Provide education, explanation, and written materials on patient's individual exercise prescription       Expected Outcomes Short Term: Able to explain program exercise prescription;Long Term: Able to explain home exercise  prescription to exercise independently              Exercise Goals Re-Evaluation :   Discharge Exercise Prescription (Final Exercise Prescription Changes):   Nutrition:  Target Goals: Understanding of nutrition guidelines, daily intake of sodium <1574m, cholesterol <2035m calories 30% from fat and 7% or less from saturated fats, daily to have 5 or more servings of fruits and vegetables.  Biometrics:  Pre Biometrics - 11/06/19 1603      Pre Biometrics   Grip Strength 34 kg            Nutrition Therapy Plan and Nutrition Goals:   Nutrition Assessments:   Nutrition Goals Re-Evaluation:   Nutrition Goals Discharge (Final Nutrition Goals Re-Evaluation):   Psychosocial: Target Goals: Acknowledge presence or absence of significant depression and/or stress, maximize coping skills, provide positive support system. Participant is able to verbalize types and ability to use techniques and skills needed for reducing stress and depression.  Initial Review & Psychosocial Screening:  Initial Psych Review & Screening - 11/06/19 1353      Initial Review   Current issues with None Identified;Current Sleep Concerns      Family Dynamics   Good Support System? Yes    Comments supportive husband of 2065 years      Barriers   Psychosocial barriers to participate in program The patient should benefit from training in stress management and relaxation.      Screening Interventions   Interventions  Encouraged to exercise           Quality of Life Scores:  Scores of 19 and below usually indicate a poorer quality of life in these areas.  A difference of  2-3 points is a clinically meaningful difference.  A difference of 2-3 points in the total score of the Quality of Life Index has been associated with significant improvement in overall quality of life, self-image, physical symptoms, and general health in studies assessing change in quality of life.  PHQ-9: Recent Review Flowsheet Data    [facility] 11/06/2019 11/06/2019 07/09/2019 07/04/2018 07/03/2018   Decreased Interest 0 0 0 0 0   Down, Depressed, Hopeless 0 0 0 0 0   PHQ - 2 Score 0 0 0 0 0   Altered sleeping 3 - - - -   Tired, decreased energy 3 - - - -   Change in appetite 0 - - - -   Feeling bad or failure about yourself  0 - - - -   Trouble concentrating 0 - - - -   Moving slowly or fidgety/restless 0 - - - -   Suicidal thoughts 0 - - - -   PHQ-9 Score 6 - - - -   Difficult doing work/chores Not difficult at all - - - -     Interpretation of Total Score  Total Score Depression Severity:  1-4 = Minimal depression, 5-9 = Mild depression, 10-14 = Moderate depression, 15-19 = Moderately severe depression, 20-27 = Severe depression   Psychosocial Evaluation and Intervention:  Psychosocial Evaluation - 11/06/19 1359      Psychosocial Evaluation & Interventions   Interventions Encouraged to exercise with the program and follow exercise prescription;Stress management education;Relaxation education    Continue Psychosocial Services  Follow up required by staff           Psychosocial Re-Evaluation:   Psychosocial Discharge (Final Psychosocial Re-Evaluation):   Education: Education Goals: Education classes will be provided on a weekly basis,  covering required topics. Participant will state understanding/return demonstration of topics presented.  Learning Barriers/Preferences:  Learning  Barriers/Preferences - 11/06/19 1400      Learning Barriers/Preferences   Learning Barriers Hearing    Learning Preferences Verbal Instruction;Individual Instruction;Written Material           Education Topics: Risk Factor Reduction:  -Group instruction that is supported by a PowerPoint presentation. Instructor discusses the definition of a risk factor, different risk factors for pulmonary disease, and how the heart and lungs work together.     Nutrition for Pulmonary Patient:  -Group instruction provided by PowerPoint slides, verbal discussion, and written materials to support subject matter. The instructor gives an explanation and review of healthy diet recommendations, which includes a discussion on weight management, recommendations for fruit and vegetable consumption, as well as protein, fluid, caffeine, fiber, sodium, sugar, and alcohol. Tips for eating when patients are short of breath are discussed.   Pursed Lip Breathing:  -Group instruction that is supported by demonstration and informational handouts. Instructor discusses the benefits of pursed lip and diaphragmatic breathing and detailed demonstration on how to preform both.     Oxygen Safety:  -Group instruction provided by PowerPoint, verbal discussion, and written material to support subject matter. There is an overview of "What is Oxygen" and "Why do we need it".  Instructor also reviews how to create a safe environment for oxygen use, the importance of using oxygen as prescribed, and the risks of noncompliance. There is a brief discussion on traveling with oxygen and resources the patient may utilize.   Oxygen Equipment:  -Group instruction provided by [facility] Staff utilizing handouts, written materials, and equipment demonstrations.   Signs and Symptoms:  -Group instruction provided by written material and verbal discussion to support subject matter. Warning signs and symptoms of infection, stroke, and heart  attack are reviewed and when to call the physician/911 reinforced. Tips for preventing the spread of infection discussed.   Advanced Directives:  -Group instruction provided by verbal instruction and written material to support subject matter. Instructor reviews Advanced Directive laws and proper instruction for filling out document.   Pulmonary Video:  -Group video education that reviews the importance of medication and oxygen compliance, exercise, good nutrition, pulmonary hygiene, and pursed lip and diaphragmatic breathing for the pulmonary patient.   Exercise for the Pulmonary Patient:  -Group instruction that is supported by a PowerPoint presentation. Instructor discusses benefits of exercise, core components of exercise, frequency, duration, and intensity of an exercise routine, importance of utilizing pulse oximetry during exercise, safety while exercising, and options of places to exercise outside of rehab.     Pulmonary Medications:  -Verbally interactive group education provided by instructor with focus on inhaled medications and proper administration.   Anatomy and Physiology of the Respiratory System and Intimacy:  -Group instruction provided by PowerPoint, verbal discussion, and written material to support subject matter. Instructor reviews respiratory cycle and anatomical components of the respiratory system and their functions. Instructor also reviews differences in obstructive and restrictive respiratory diseases with examples of each. Intimacy, Sex, and Sexuality differences are reviewed with a discussion on how relationships can change when diagnosed with pulmonary disease. Common sexual concerns are reviewed.   MD DAY -A group question and answer session with a medical doctor that allows participants to ask questions that relate to their pulmonary disease state.   OTHER EDUCATION -Group or individual verbal, written, or video instructions that support the educational  goals of the pulmonary rehab program.  Holiday Eating Survival Tips:  -Group instruction provided by PowerPoint slides, verbal discussion, and written materials to support subject matter. The instructor gives patients tips, tricks, and techniques to help them not only survive but enjoy the holidays despite the onslaught of food that accompanies the holidays.   Knowledge Questionnaire Score:  Knowledge Questionnaire Score - 11/06/19 1548      Knowledge Questionnaire Score   Pre Score 16/18           Core Components/Risk Factors/Patient Goals at Admission:  Personal Goals and Risk Factors at Admission - 11/06/19 1401      Core Components/Risk Factors/Patient Goals on Admission   Improve shortness of breath with ADL's Yes    Intervention Provide education, individualized exercise plan and daily activity instruction to help decrease symptoms of SOB with activities of daily living.    Expected Outcomes Short Term: Improve cardiorespiratory fitness to achieve a reduction of symptoms when performing ADLs;Long Term: Be able to perform more ADLs without symptoms or delay the onset of symptoms    Lipids Yes    Intervention Provide education and support for participant on nutrition & aerobic/resistive exercise along with prescribed medications to achieve LDL <90m, HDL >469m    Expected Outcomes Short Term: Participant states understanding of desired cholesterol values and is compliant with medications prescribed. Participant is following exercise prescription and nutrition guidelines.;Long Term: Cholesterol controlled with medications as prescribed, with individualized exercise RX and with personalized nutrition plan. Value goals: LDL < 7041mHDL > 40 mg.           Core Components/Risk Factors/Patient Goals Review:    Core Components/Risk Factors/Patient Goals at Discharge (Final Review):    ITP Comments:   Comments:

## 2019-11-09 ENCOUNTER — Other Ambulatory Visit (INDEPENDENT_AMBULATORY_CARE_PROVIDER_SITE_OTHER): Payer: Medicare Other

## 2019-11-09 DIAGNOSIS — E039 Hypothyroidism, unspecified: Secondary | ICD-10-CM | POA: Diagnosis not present

## 2019-11-09 LAB — TSH: TSH: 0.41 u[IU]/mL (ref 0.35–4.50)

## 2019-11-09 NOTE — Addendum Note (Signed)
Addended by: Cresenciano Lick on: 11/09/2019 01:13 PM   Modules accepted: Orders

## 2019-11-10 ENCOUNTER — Other Ambulatory Visit: Payer: Self-pay

## 2019-11-10 ENCOUNTER — Encounter (HOSPITAL_COMMUNITY)
Admission: RE | Admit: 2019-11-10 | Discharge: 2019-11-10 | Disposition: A | Discharge status: Home / Self Care | Payer: Medicare Other | Source: Ambulatory Visit | Attending: Internal Medicine | Admitting: Internal Medicine

## 2019-11-10 DIAGNOSIS — J84112 Idiopathic pulmonary fibrosis: Secondary | ICD-10-CM | POA: Diagnosis not present

## 2019-11-10 NOTE — Progress Notes (Signed)
Daily Session Note  Patient Details  Name: Elizabeth Espinoza MRN: [id number] Date of Birth: May 12, 1940 Referring Provider:     Pulmonary Rehab Walk Test from 11/06/2019 in [place]  Referring Provider Dr. Chase Caller      Encounter Date: 11/10/2019  Check In:  Session Check In - 11/10/19 1144      Check-In   Supervising physician immediately available to respond to emergencies [facility] immediately available    Physician(s) Dr. Lonny Prude    Location [facility]    Staff Present Maurice Small, RN, BSN;Lisa Ysidro Evert, RN;Trenna Kiely Hassell Done, MS, ACSM-CEP, Exercise Physiologist    Virtual Visit No    Medication changes reported     No    Fall or balance concerns reported    No    Tobacco Cessation No Change    Warm-up and Cool-down Performed on first and last piece of equipment    Resistance Training Performed Yes    VAD Patient? No    PAD/SET Patient? No      Pain Assessment   Currently in Pain? No/denies    Pain Score 0-No pain    Multiple Pain Sites No           Capillary Blood Glucose: Results for orders placed or performed in visit on 11/09/19 (from the past 24 hour(s))  TSH     Status: None   Collection Time: 11/09/19  1:13 PM  Result Value Ref Range   TSH 0.41 0.35 - 4.50 uIU/mL      Social History   Tobacco Use  Smoking Status Never Smoker  Smokeless Tobacco Never Used    Goals Met:  Proper associated with RPD/PD & O2 Sat Exercise tolerated well No report of cardiac concerns or symptoms Strength training completed today  Goals Unmet:  Not Applicable  Comments: Service time is from 0945 to 1100    Dr. Fransico Him is Medical Director for Cardiac Rehab at [facility].

## 2019-11-12 ENCOUNTER — Other Ambulatory Visit: Payer: Self-pay

## 2019-11-12 ENCOUNTER — Encounter (HOSPITAL_COMMUNITY)
Admission: RE | Admit: 2019-11-12 | Discharge: 2019-11-12 | Disposition: A | Discharge status: Home / Self Care | Payer: Medicare Other | Source: Ambulatory Visit | Attending: Internal Medicine | Admitting: Internal Medicine

## 2019-11-12 DIAGNOSIS — J84112 Idiopathic pulmonary fibrosis: Secondary | ICD-10-CM | POA: Diagnosis not present

## 2019-11-12 NOTE — Progress Notes (Signed)
Daily Session Note  Patient Details  Name: Elizabeth Espinoza MRN: [id number] Date of Birth: 03/12/1940 Referring Provider:     Pulmonary Rehab Walk Test from 11/06/2019 in [place]  Referring Provider Dr. Chase Caller      Encounter Date: 11/12/2019  Check In:  Session Check In - 11/12/19 1108      Check-In   Supervising physician immediately available to respond to emergencies [facility] immediately available    Physician(s) Dr. Dessa Phi    Location [facility]    Staff Present Maurice Small, RN, BSN;Scott Vanderveer Ysidro Evert, RN;Jessica Hassell Done, MS, ACSM-CEP, Exercise Physiologist    Virtual Visit No    Medication changes reported     No    Fall or balance concerns reported    No    Tobacco Cessation No Change    Warm-up and Cool-down Performed on first and last piece of equipment    Resistance Training Performed Yes    VAD Patient? No    PAD/SET Patient? No      Pain Assessment   Currently in Pain? No/denies    Pain Score 0-No pain    Multiple Pain Sites No           Capillary Blood Glucose: No results found for this or any previous visit (from the past 24 hour(s)).    Social History   Tobacco Use  Smoking Status Never Smoker  Smokeless Tobacco Never Used    Goals Met:  Exercise tolerated well No report of cardiac concerns or symptoms Strength training completed today  Goals Unmet:  Not Applicable  Comments: Service time is from 0950 to 40  Dr. Fransico Him is Medical Director for Cardiac Rehab at [facility]

## 2019-11-12 NOTE — Progress Notes (Signed)
Daily Session Note  Patient Details  Name: Elizabeth Espinoza MRN: [id number] Date of Birth: 08/16/1940 Referring Provider:     Pulmonary Rehab Walk Test from 11/06/2019 in [facility]  Referring Provider Dr. Ramaswamy      Encounter Date: 11/12/2019  Check In:  Session Check In - 11/12/19 1108      Check-In   Supervising physician immediately available to respond to emergencies [facility] immediately available    Physician(s) Dr. Jennifer Choi    Location [facility]    Staff Present Carlette Carlton, RN, BSN;Kasie Leccese, RN;Jessica Martin, MS, ACSM-CEP, Exercise Physiologist    Virtual Visit No    Medication changes reported     No    Fall or balance concerns reported    No    Tobacco Cessation No Change    Warm-up and Cool-down Performed on first and last piece of equipment    Resistance Training Performed Yes    VAD Patient? No    PAD/SET Patient? No      Pain Assessment   Currently in Pain? No/denies    Pain Score 0-No pain    Multiple Pain Sites No           Capillary Blood Glucose: No results found for this or any previous visit (from the past 24 hour(s)).    Social History   Tobacco Use  Smoking Status Never Smoker  Smokeless Tobacco Never Used    Goals Met:  Exercise tolerated well No report of cardiac concerns or symptoms Strength training completed today  Goals Unmet:  Not Applicable  Comments: Service time is from 0950 to 1105  Dr. Traci Turner is Medical Director for Cardiac Rehab at [facility]. 

## 2019-11-17 ENCOUNTER — Other Ambulatory Visit: Payer: Self-pay

## 2019-11-17 ENCOUNTER — Encounter (HOSPITAL_COMMUNITY)
Admission: RE | Admit: 2019-11-17 | Discharge: 2019-11-17 | Disposition: A | Discharge status: Home / Self Care | Payer: Medicare Other | Source: Ambulatory Visit | Attending: Internal Medicine | Admitting: Internal Medicine

## 2019-11-17 DIAGNOSIS — J84112 Idiopathic pulmonary fibrosis: Secondary | ICD-10-CM | POA: Diagnosis not present

## 2019-11-17 NOTE — Progress Notes (Signed)
Daily Session Note  Patient Details  Name: Elizabeth Espinoza MRN: [id number] Date of Birth: Feb 17, 1940 Referring Provider:     Pulmonary Rehab Walk Test from 11/06/2019 in [place]  Referring Provider Dr. Chase Caller      Encounter Date: 11/17/2019  Check In:  Session Check In - 11/17/19 1023      Check-In   Supervising physician immediately available to respond to emergencies [facility] immediately available    Physician(s) Dr. Dessa Phi    Location [facility]    Staff Present Maurice Small, RN, BSN;Lisa Ysidro Evert, RN;Abbigael Detlefsen Hassell Done, MS, ACSM-CEP, Exercise Physiologist    Virtual Visit No    Medication changes reported     No    Fall or balance concerns reported    No    Tobacco Cessation No Change    Warm-up and Cool-down Performed on first and last piece of equipment    Resistance Training Performed Yes    VAD Patient? No    PAD/SET Patient? No      Pain Assessment   Currently in Pain? No/denies    Pain Score 0-No pain    Multiple Pain Sites No           Capillary Blood Glucose: No results found for this or any previous visit (from the past 24 hour(s)).    Social History   Tobacco Use  Smoking Status Never Smoker  Smokeless Tobacco Never Used    Goals Met:  Proper associated with RPD/PD & O2 Sat [place] with exercise equipment Exercise tolerated well No report of cardiac concerns or symptoms Strength training completed today  Goals Unmet:  Not Applicable  Comments: Service time is from 0945 to 1115 Pt did not check out with Rehab team today. Pt left without [place] getting her post vitals.     Dr. Fransico Him is Medical Director for Cardiac Rehab at [facility].

## 2019-11-17 NOTE — Progress Notes (Signed)
Elizabeth Espinoza 79 y.o. female Nutrition Note  Visit Diagnosis: IPF (idiopathic pulmonary fibrosis) (HCC)  Past Medical History:  Diagnosis Date  . Allergic rhinitis   . GERD (gastroesophageal reflux disease)   . Glaucoma    SUSPECT  . Hiatal hernia   . Hyperlipidemia    LDL goal = < 100  . Hypothyroidism   . ILD (interstitial lung disease) ([place])   . Lichen planopilaris   . MVP (mitral valve prolapse)    documented on 2 D ECHO  . Restless legs   . Urticaria      Medications reviewed.   Current Outpatient Medications:  .  aspirin EC 81 MG tablet, Take 1 tablet (81 mg total) by mouth daily., Disp: 90 tablet, Rfl: 3 .  betamethasone, augmented, (DIPROLENE) 0.05 % lotion, Apply 1 application topically 2 (two) times daily., Disp: , Rfl:  .  Biotin 5000 MCG TABS, Take 5,000 mcg by mouth daily., Disp: , Rfl:  .  camphor-menthol (SARNA) lotion, Apply 1 application topically as needed for itching. (Patient not taking: Reported on 11/06/2019), Disp: , Rfl:  .  Carboxymethylcellul-Glycerin (REFRESH RELIEVA OP), Place 1 drop into both eyes every 6 (six) hours as needed (dry eyes)., Disp: , Rfl:  .  cetirizine (ZYRTEC) 10 MG tablet, Take 10 mg by mouth daily. , Disp: , Rfl:  .  cholecalciferol (VITAMIN D3) 25 MCG (1000 UT) tablet, Take 1,000 Units by mouth daily., Disp: , Rfl:  .  clobetasol (TEMOVATE) 0.05 % external solution, SMARTSIG:1 Milliliter(s) Topical Twice Daily (Patient not taking: Reported on 11/06/2019), Disp: , Rfl:  .  esomeprazole (NEXIUM) 40 MG capsule, Take 40 mg by mouth every morning., Disp: , Rfl:  .  famotidine (PEPCID) 40 MG tablet, Take 1 tablet (40 mg total) by mouth daily., Disp: 90 tablet, Rfl: 1 .  fluticasone (FLONASE) 50 MCG/ACT nasal spray, Place 1 spray into both nostrils daily as needed for allergies or rhinitis., Disp: , Rfl:  .  hydroxychloroquine (PLAQUENIL) 200 MG tablet, Take 200 mg by mouth 2 (two) times daily., Disp: , Rfl:  .  hydrOXYzine  (ATARAX/VISTARIL) 25 MG tablet, Take 25 mg by mouth 3 (three) times daily. (Patient not taking: Reported on 11/06/2019), Disp: , Rfl:  .  levothyroxine (SYNTHROID) 100 MCG tablet, Take 1 tablet (100 mcg total) by mouth daily., Disp: 90 tablet, Rfl: 3 .  magnesium oxide (MAG-OX) 400 MG tablet, Take 400 mg by mouth daily., Disp: , Rfl:  .  Multiple Vitamins-Minerals (ZINC PO), Take 1 tablet by mouth., Disp: , Rfl:  .  pravastatin (PRAVACHOL) 40 MG tablet, TAKE 1 TABLET BY MOUTH  DAILY, Disp: 90 tablet, Rfl: 3 .  rOPINIRole (REQUIP) 0.5 MG tablet, TAKE 2 TABLETS BY MOUTH  AFTER DINNER EACH NIGHT., Disp: 180 tablet, Rfl: 3   Ht Readings from Last 1 Encounters:  11/06/19 5' 6.25" (1.683 m)     Wt Readings from Last 3 Encounters:  11/06/19 149 lb 0.5 oz (67.6 kg)  09/29/19 147 lb 12.8 oz (67 kg)  07/09/19 151 lb 12.8 oz (68.9 kg)     There is no height or weight on file to calculate BMI.   Social History   Tobacco Use  Smoking Status Never Smoker  Smokeless Tobacco Never Used      Nutrition Note  Spoke with pt. Nutrition Plan and Nutrition Survey goals reviewed with pt.   Pt has Pre-diabetes. Last A1c indicates blood glucose well-controlled.   Acid reflux: yes. Controlled on  meds. Sleeps on wedge pillow. She does report shoulder pain when using the wedge pillow. Appetite: good Unintentional weight loss: none Difficulty eating: none Protein: adequate with 3 balanced meals of protein/carbs/fats daily  Fluids: Water throughout day (16 oz with meds, 16 oz while walking, more throughout the day). Sweet tea (2 glasses daily)  Overall, pt eats a balanced diet with a variety of protein sources including fish. She cooks in olive oil and eats other sources of healthy fats. She eats fruits/veggies daily and chooses whole grains most of the time.   Poor sleep habits. She has tried melatonin with no success.   Pt expressed understanding of the information reviewed.    Nutrition  Diagnosis ? Food-and nutrition-related knowledge deficit related to lack of exposure to information as related to diagnosis of: ? hyperlipidemia, IPF, and pre diabetes  Nutrition Intervention ? Pt's individual nutrition plan reviewed with pt. ? Benefits of adopting healthy diet reviewed with Rate My Plate survey ? Continue client-centered nutrition education by RD, as part of interdisciplinary care.  Goal(s)  ? Pt to continue to build a healthy plate including vegetables, fruits, whole grains, and low-fat dairy products in a generally healthy meal plan.  Plan:   Will provide client-centered nutrition education as part of interdisciplinary care  Monitor and evaluate progress toward nutrition goal with team.   Michaele Offer, MS, RDN, LDN

## 2019-11-19 ENCOUNTER — Encounter (HOSPITAL_COMMUNITY)
Admission: RE | Admit: 2019-11-19 | Discharge: 2019-11-19 | Disposition: A | Discharge status: Home / Self Care | Payer: Medicare Other | Source: Ambulatory Visit | Attending: Internal Medicine | Admitting: Internal Medicine

## 2019-11-19 ENCOUNTER — Other Ambulatory Visit: Payer: Self-pay

## 2019-11-19 DIAGNOSIS — J84112 Idiopathic pulmonary fibrosis: Secondary | ICD-10-CM

## 2019-11-19 NOTE — Progress Notes (Signed)
Pulmonary Individual Treatment Plan  Patient Details  Name: Elizabeth Espinoza MRN: [id number] Date of Birth: 1940/09/30 Referring Provider:     Pulmonary Rehab Walk Test from 11/06/2019 in [facility]  Referring Provider Dr. Chase Caller      Initial Encounter Date:    Pulmonary Rehab Walk Test from 11/06/2019 in [facility]  Date 11/06/19      Visit Diagnosis: IPF (idiopathic pulmonary fibrosis) ([place])  Patient's Home Medications on Admission:   Current Outpatient Medications:  .  aspirin EC 81 MG tablet, Take 1 tablet (81 mg total) by mouth daily., Disp: 90 tablet, Rfl: 3 .  betamethasone, augmented, (DIPROLENE) 0.05 % lotion, Apply 1 application topically 2 (two) times daily., Disp: , Rfl:  .  Biotin 5000 MCG TABS, Take 5,000 mcg by mouth daily., Disp: , Rfl:  .  camphor-menthol (SARNA) lotion, Apply 1 application topically as needed for itching. (Patient not taking: Reported on 11/06/2019), Disp: , Rfl:  .  Carboxymethylcellul-Glycerin (REFRESH RELIEVA OP), Place 1 drop into both eyes every 6 (six) hours as needed (dry eyes)., Disp: , Rfl:  .  cetirizine (ZYRTEC) 10 MG tablet, Take 10 mg by mouth daily. , Disp: , Rfl:  .  cholecalciferol (VITAMIN D3) 25 MCG (1000 UT) tablet, Take 1,000 Units by mouth daily., Disp: , Rfl:  .  clobetasol (TEMOVATE) 0.05 % external solution, SMARTSIG:1 Milliliter(s) Topical Twice Daily (Patient not taking: Reported on 11/06/2019), Disp: , Rfl:  .  esomeprazole (NEXIUM) 40 MG capsule, Take 40 mg by mouth every morning., Disp: , Rfl:  .  famotidine (PEPCID) 40 MG tablet, Take 1 tablet (40 mg total) by mouth daily., Disp: 90 tablet, Rfl: 1 .  fluticasone (FLONASE) 50 MCG/ACT nasal spray, Place 1 spray into both nostrils daily as needed for allergies or rhinitis., Disp: , Rfl:  .  hydroxychloroquine (PLAQUENIL) 200 MG tablet, Take 200 mg by mouth 2 (two) times daily., Disp: , Rfl:  .  hydrOXYzine  (ATARAX/VISTARIL) 25 MG tablet, Take 25 mg by mouth 3 (three) times daily. (Patient not taking: Reported on 11/06/2019), Disp: , Rfl:  .  levothyroxine (SYNTHROID) 100 MCG tablet, Take 1 tablet (100 mcg total) by mouth daily., Disp: 90 tablet, Rfl: 3 .  magnesium oxide (MAG-OX) 400 MG tablet, Take 400 mg by mouth daily., Disp: , Rfl:  .  Multiple Vitamins-Minerals (ZINC PO), Take 1 tablet by mouth., Disp: , Rfl:  .  pravastatin (PRAVACHOL) 40 MG tablet, TAKE 1 TABLET BY MOUTH  DAILY, Disp: 90 tablet, Rfl: 3 .  rOPINIRole (REQUIP) 0.5 MG tablet, TAKE 2 TABLETS BY MOUTH  AFTER DINNER EACH NIGHT., Disp: 180 tablet, Rfl: 3  Past Medical History: Past Medical History:  Diagnosis Date  . Allergic rhinitis   . GERD (gastroesophageal reflux disease)   . Glaucoma    SUSPECT  . Hiatal hernia   . Hyperlipidemia    LDL goal = < 100  . Hypothyroidism   . ILD (interstitial lung disease) ([place])   . Lichen planopilaris   . MVP (mitral valve prolapse)    documented on 2 D ECHO  . Restless legs   . Urticaria     Tobacco Use: Social History   Tobacco Use  Smoking Status Never Smoker  Smokeless Tobacco Never Used    Labs: Recent Review Flowsheet Data    Labs for ITP Cardiac and Pulmonary Rehab Latest Ref Rng & Units 06/13/2015 06/27/2016 07/02/2017 07/04/2018 07/06/2019  Cholestrol 0 - 200 mg/dL - 160 156 168 168   LDLCALC 0 - 99 mg/dL - 84 80 88 85   HDL >39.00 mg/dL - 54.60 43.10 52.30 49.40   Trlycerides 0 - 149 mg/dL - 109.0 164.0(H) 136.0 165.0(H)   Hemoglobin A1c 4.6 - 6.5 % 6.1 6.0 6.0 6.2 6.1      Capillary Blood Glucose: No results found for: GLUCAP   Pulmonary Assessment Scores:  Pulmonary Assessment Scores    Row Name 11/06/19 1549         ADL UCSD   ADL Phase Entry     SOB Score total 51       CAT Score   CAT Score 20       mMRC Score   mMRC Score 2           UCSD: Self-administered rating of dyspnea associated with activities of daily living (ADLs) 6-point  scale (0 = "not at all" to 5 = "maximal or unable to do because of breathlessness")  Scoring Scores range from 0 to 120.  Minimally important difference is 5 units  CAT: CAT can identify the health impairment of COPD patients and is better correlated with disease progression.  CAT has a scoring range of zero to 40. The CAT score is classified into four groups of low (less than 10), medium (10 - 20), high (21-30) and very high (31-40) based on the impact level of disease on health status. A CAT score over 10 suggests significant symptoms.  A worsening CAT score could be explained by an exacerbation, poor medication adherence, poor inhaler technique, or progression of COPD or comorbid conditions.  CAT MCID is 2 points  mMRC: mMRC (Modified Medical Research Council) Dyspnea Scale is used to assess the degree of baseline functional disability in patients of respiratory disease due to dyspnea. No minimal important difference is established. A decrease in score of 1 point or greater is considered a positive change.   Pulmonary Function Assessment:   Exercise Target Goals: Exercise Program Goal: Individual exercise prescription set using results from initial 6 min walk test and THRR while considering  patient's activity barriers and safety.   Exercise Prescription Goal: Initial exercise prescription builds to 30-45 minutes a day of aerobic activity, 2-3 days per week.  Home exercise guidelines will be given to patient during program as part of exercise prescription that the participant will acknowledge.  Activity Barriers & Risk Stratification:  Activity Barriers & Cardiac Risk Stratification - 11/06/19 1350      Activity Barriers & Cardiac Risk Stratification   Cardiac Risk Stratification Moderate           6 Minute Walk:  6 Minute Walk    Row Name 11/06/19 1552         6 Minute Walk   Phase Initial     Distance 1412 feet     Walk Time 6 minutes     # of Rest Breaks 2  Had to stop  her twice because her oxygen saturation dropped below 88%     MPH 2.67     METS 3.21     RPE 11     Perceived Dyspnea  1     VO2 Peak 11.24     Symptoms Yes (comment)  Oxygen saturation dropped to 83%     Resting HR 80 bpm     Resting BP 124/70     Resting Oxygen Saturation  99 %     Exercise  Oxygen Saturation  during 6 min walk 83 %     Max Ex. HR 117 bpm     Max Ex. BP 154/70     2 Minute Post BP 116/68       Interval HR   1 Minute HR 78     2 Minute HR 105     3 Minute HR 114     4 Minute HR 114     5 Minute HR 117     6 Minute HR 116     2 Minute Post HR 72     Interval Heart Rate? Yes       Interval Oxygen   Interval Oxygen? Yes     Baseline Oxygen Saturation % 99 %     1 Minute Oxygen Saturation % 84 %     1 Minute Liters of Oxygen 0 L     2 Minute Oxygen Saturation % 83 %     2 Minute Liters of Oxygen 0 L     3 Minute Oxygen Saturation % 85 %     3 Minute Liters of Oxygen 0 L     4 Minute Oxygen Saturation % 88 %     4 Minute Liters of Oxygen 0 L     5 Minute Oxygen Saturation % 91 %     5 Minute Liters of Oxygen 0 L     6 Minute Oxygen Saturation % 87 %     6 Minute Liters of Oxygen 0 L     2 Minute Post Oxygen Saturation % 100 %     2 Minute Post Liters of Oxygen 0 L            Oxygen Initial Assessment:  Oxygen Initial Assessment - 11/06/19 1627      Home Oxygen   Home Oxygen Device None    Sleep Oxygen Prescription None    Home Exercise Oxygen Prescription None    Home Resting Oxygen Prescription None      Initial 6 min Walk   Oxygen Used None      Program Oxygen Prescription   Program Oxygen Prescription None      Intervention   Short Term Goals To learn and exhibit compliance with exercise, home and travel O2 prescription;To learn and understand importance of monitoring SPO2 with pulse oximeter and demonstrate accurate use of the pulse oximeter.;To learn and understand importance of maintaining oxygen saturations>88%;To learn and demonstrate  proper pursed lip breathing techniques or other breathing techniques.;To learn and demonstrate proper use of respiratory medications    Long  Term Goals Compliance with respiratory medication;Exhibits proper breathing techniques, such as pursed lip breathing or other method taught during program session;Maintenance of O2 saturations>88%;Verbalizes importance of monitoring SPO2 with pulse oximeter and return demonstration;Exhibits compliance with exercise, home and travel O2 prescription           Oxygen Re-Evaluation:  Oxygen Re-Evaluation    Row Name 11/19/19 0712             Program Oxygen Prescription   Program Oxygen Prescription None         Home Oxygen   Home Oxygen Device None       Sleep Oxygen Prescription None       Home Exercise Oxygen Prescription None       Home Resting Oxygen Prescription None         Goals/Expected Outcomes   Short Term Goals To learn and exhibit compliance with exercise,  home and travel O2 prescription;To learn and understand importance of monitoring SPO2 with pulse oximeter and demonstrate accurate use of the pulse oximeter.;To learn and understand importance of maintaining oxygen saturations>88%;To learn and demonstrate proper pursed lip breathing techniques or other breathing techniques.;To learn and demonstrate proper use of respiratory medications       Long  Term Goals Compliance with respiratory medication;Exhibits proper breathing techniques, such as pursed lip breathing or other method taught during program session;Maintenance of O2 saturations>88%;Verbalizes importance of monitoring SPO2 with pulse oximeter and return demonstration;Exhibits compliance with exercise, home and travel O2 prescription       Goals/Expected Outcomes Compliance and understanding of oxygen saturation and pursed lip breathing              Oxygen Discharge (Final Oxygen Re-Evaluation):  Oxygen Re-Evaluation - 11/19/19 0712      Program Oxygen Prescription    Program Oxygen Prescription None      Home Oxygen   Home Oxygen Device None    Sleep Oxygen Prescription None    Home Exercise Oxygen Prescription None    Home Resting Oxygen Prescription None      Goals/Expected Outcomes   Short Term Goals To learn and exhibit compliance with exercise, home and travel O2 prescription;To learn and understand importance of monitoring SPO2 with pulse oximeter and demonstrate accurate use of the pulse oximeter.;To learn and understand importance of maintaining oxygen saturations>88%;To learn and demonstrate proper pursed lip breathing techniques or other breathing techniques.;To learn and demonstrate proper use of respiratory medications    Long  Term Goals Compliance with respiratory medication;Exhibits proper breathing techniques, such as pursed lip breathing or other method taught during program session;Maintenance of O2 saturations>88%;Verbalizes importance of monitoring SPO2 with pulse oximeter and return demonstration;Exhibits compliance with exercise, home and travel O2 prescription    Goals/Expected Outcomes Compliance and understanding of oxygen saturation and pursed lip breathing           Initial Exercise Prescription:  Initial Exercise Prescription - 11/06/19 1600      Date of Initial Exercise RX and Referring Provider   Date 11/06/19    Referring Provider Dr. Chase Caller    Expected Discharge Date 01/07/20      Treadmill   Minutes 15      Bike   Level 2    Minutes 15      Prescription Details   Frequency (times per week) 2    Duration Progress to 30 minutes of continuous aerobic without signs/symptoms of physical distress      Intensity   THRR 40-80% of Max Heartrate 56-113    Ratings of Perceived Exertion 11-13    Perceived Dyspnea 0-4      Progression   Progression Continue to progress workloads to maintain intensity without signs/symptoms of physical distress.      Resistance Training   Training Prescription Yes    Weight  orange bands    Reps 10-15           Perform Capillary Blood Glucose checks as needed.  Exercise Prescription Changes:  Exercise Prescription Changes    Row Name 11/19/19 0700             Response to Exercise   Blood Pressure (Admit) 138/76       Blood Pressure (Exercise) 142/70       Blood Pressure (Exit) 120/64       Heart Rate (Admit) 73 bpm       Heart Rate (Exercise) 111 bpm  Heart Rate (Exit) 66 bpm       Oxygen Saturation (Admit) 99 %       Oxygen Saturation (Exercise) 91 %       Oxygen Saturation (Exit) 97 %       Rating of Perceived Exertion (Exercise) 12       Perceived Dyspnea (Exercise) 2       Duration Continue with 30 min of aerobic exercise without signs/symptoms of physical distress.       Intensity Other (comment)  40%-80% HR Max         Progression   Progression Continue to progress workloads to maintain intensity without signs/symptoms of physical distress.       Average METs 4.4         Resistance Training   Training Prescription Yes       Weight orange bands       Reps 10-15       Time 10 Minutes         Treadmill   MPH 3       Grade 1       Minutes 15         Bike   Level 2       Minutes 15       METs 4.8              Exercise Comments:  Exercise Comments    Row Name 11/10/19 1213           Exercise Comments Pt completed first day of exercise and tolerated exercise well. She had no complaints or concerns. She is a very active person and is able to do the upright bike and treadmill. We will continue to follow and progress her as she is able.              Exercise Goals and Review:  Exercise Goals    Row Name 11/06/19 1551             Exercise Goals   Increase Physical Activity Yes       Intervention Provide advice, education, support and counseling about physical activity/exercise needs.;Develop an individualized exercise prescription for aerobic and resistive training based on initial evaluation findings, risk  stratification, comorbidities and participant's personal goals.       Expected Outcomes Short Term: Attend rehab on a regular basis to increase amount of physical activity.;Long Term: Add in home exercise to make exercise part of routine and to increase amount of physical activity.;Long Term: Exercising regularly at least 3-5 days a week.       Increase Strength and Stamina Yes       Intervention Provide advice, education, support and counseling about physical activity/exercise needs.;Develop an individualized exercise prescription for aerobic and resistive training based on initial evaluation findings, risk stratification, comorbidities and participant's personal goals.       Expected Outcomes Short Term: Increase workloads from initial exercise prescription for resistance, speed, and METs.;Short Term: Perform resistance training exercises routinely during rehab and add in resistance training at home;Long Term: Improve cardiorespiratory fitness, muscular endurance and strength as measured by increased METs and functional capacity (6MWT)       Able to understand and use rate of perceived exertion (RPE) scale Yes       Intervention Provide education and explanation on how to use RPE scale       Expected Outcomes Short Term: Able to use RPE daily in rehab to express subjective intensity level;Long Term:  Able to use RPE to guide intensity level when exercising independently       Able to understand and use Dyspnea scale Yes       Intervention Provide education and explanation on how to use Dyspnea scale       Expected Outcomes Short Term: Able to use Dyspnea scale daily in rehab to express subjective sense of shortness of breath during exertion;Long Term: Able to use Dyspnea scale to guide intensity level when exercising independently       Knowledge and understanding of Target Heart Rate Range (THRR) Yes       Intervention Provide education and explanation of THRR including how the numbers were predicted  and where they are located for reference       Expected Outcomes Short Term: Able to state/look up THRR;Long Term: Able to use THRR to govern intensity when exercising independently;Short Term: Able to use daily as guideline for intensity in rehab       Understanding of Exercise Prescription Yes       Intervention Provide education, explanation, and written materials on patient's individual exercise prescription       Expected Outcomes Short Term: Able to explain program exercise prescription;Long Term: Able to explain home exercise prescription to exercise independently              Exercise Goals Re-Evaluation :  Exercise Goals Re-Evaluation    Row Name 11/19/19 0710             Exercise Goal Re-Evaluation   Exercise Goals Review Increase Physical Activity;Increase Strength and Stamina;Able to understand and use rate of perceived exertion (RPE) scale;Able to understand and use Dyspnea scale;Knowledge and understanding of Target Heart Rate Range (THRR);Understanding of Exercise Prescription       Comments Pt has completed 3 exercise sessions and has tolerated very well. Pt is very motivated and pushes herself to make progressions. She is currently exercisisng at 4.8 METS on the Bike. Will continue to monitor and progress as able.       Expected Outcomes Through exercise at rehab and home the patient will decrease shortness of breath with daily activities and feel confident in carrying out an exercise regimn at home.              Discharge Exercise Prescription (Final Exercise Prescription Changes):  Exercise Prescription Changes - 11/19/19 0700      Response to Exercise   Blood Pressure (Admit) 138/76    Blood Pressure (Exercise) 142/70    Blood Pressure (Exit) 120/64    Heart Rate (Admit) 73 bpm    Heart Rate (Exercise) 111 bpm    Heart Rate (Exit) 66 bpm    Oxygen Saturation (Admit) 99 %    Oxygen Saturation (Exercise) 91 %    Oxygen Saturation (Exit) 97 %    Rating of  Perceived Exertion (Exercise) 12    Perceived Dyspnea (Exercise) 2    Duration Continue with 30 min of aerobic exercise without signs/symptoms of physical distress.    Intensity Other (comment)   40%-80% HR Max     Progression   Progression Continue to progress workloads to maintain intensity without signs/symptoms of physical distress.    Average METs 4.4      Resistance Training   Training Prescription Yes    Weight orange bands    Reps 10-15    Time 10 Minutes      Treadmill   MPH 3    Grade 1  Minutes 15      Bike   Level 2    Minutes 15    METs 4.8           Nutrition:  Target Goals: Understanding of nutrition guidelines, daily intake of sodium <1512m, cholesterol <2038m calories 30% from fat and 7% or less from saturated fats, daily to have 5 or more servings of fruits and vegetables.  Biometrics:  Pre Biometrics - 11/06/19 1603      Pre Biometrics   Grip Strength 34 kg            Nutrition Therapy Plan and Nutrition Goals:  Nutrition Therapy & Goals - 11/17/19 1335      Nutrition Therapy   Diet General healthful      Personal Nutrition Goals   Nutrition Goal Pt to continue to build a healthy plate including vegetables, fruits, whole grains, and low-fat dairy products in a generally healthy meal plan.      Intervention Plan   Intervention Prescribe, educate and counsel regarding individualized specific dietary modifications aiming towards targeted core components such as weight, hypertension, lipid management, diabetes, heart failure and other comorbidities.    Expected Outcomes Short Term Goal: Understand basic principles of dietary content, such as calories, fat, sodium, cholesterol and nutrients.           Nutrition Assessments:  Nutrition Assessments - 11/17/19 1336      Rate Your Plate Scores   Pre Score 60           Nutrition Goals Re-Evaluation:  Nutrition Goals Re-Evaluation    [facility] [place] 11/17[facility]19 1336             Goals    Current Weight 149 lb (67.6 kg)       Nutrition Goal Pt to continue to build a healthy plate including vegetables, fruits, whole grains, and low-fat dairy products in a generally healthy meal plan.              Nutrition Goals Discharge (Final Nutrition Goals Re-Evaluation):  Nutrition Goals Re-Evaluation - 11/17/19 1336      Goals   Current Weight 149 lb (67.6 kg)    Nutrition Goal Pt to continue to build a healthy plate including vegetables, fruits, whole grains, and low-fat dairy products in a generally healthy meal plan.           Psychosocial: Target Goals: Acknowledge presence or absence of significant depression and/or stress, maximize coping skills, provide positive support system. Participant is able to verbalize types and ability to use techniques and skills needed for reducing stress and depression.  Initial Review & Psychosocial Screening:  Initial Psych Review & Screening - 11/06/19 1353      Initial Review   Current issues with None Identified;Current Sleep Concerns      Family Dynamics   Good Support System? Yes    Comments supportive husband of 2068 years      Barriers   Psychosocial barriers to participate in program The patient should benefit from training in stress management and relaxation.      Screening Interventions   Interventions Encouraged to exercise           Quality of Life Scores:  Scores of 19 and below usually indicate a poorer quality of life in these areas.  A difference of  2-3 points is a clinically meaningful difference.  A difference of 2-3 points in the total score of the Quality of Life Index has been associated with  significant improvement in overall quality of life, self-image, physical symptoms, and general health in studies assessing change in quality of life.  PHQ-9: Recent Review Flowsheet Data    Depression screen [facility] 2/9 11/06/2019 11/06/2019 07/09/2019 07/04/2018 07/03/2018   Decreased Interest 0 0 0 0 0   Down, Depressed,  Hopeless 0 0 0 0 0   PHQ - 2 Score 0 0 0 0 0   Altered sleeping 3 - - - -   Tired, decreased energy 3 - - - -   Change in appetite 0 - - - -   Feeling bad or failure about yourself  0 - - - -   Trouble concentrating 0 - - - -   Moving slowly or fidgety/restless 0 - - - -   Suicidal thoughts 0 - - - -   PHQ-9 Score 6 - - - -   Difficult doing work/chores Not difficult at all - - - -     Interpretation of Total Score  Total Score Depression Severity:  1-4 = Minimal depression, 5-9 = Mild depression, 10-14 = Moderate depression, 15-19 = Moderately severe depression, 20-27 = Severe depression   Psychosocial Evaluation and Intervention:  Psychosocial Evaluation - 11/18/19 1357      Psychosocial Evaluation & Interventions   Expected Outcomes Denise will report less issues with her sleep with less awakening early am and the inability to go back to sleep.           Psychosocial Re-Evaluation:  Psychosocial Re-Evaluation    [place] Name 11/18/19 1356 11/18/19 1357           Psychosocial Re-Evaluation   Current issues with Current Sleep Concerns --      Comments Danissa reports that she awakens easily and has difficutly getting back to sleep. --      Expected Outcomes -- Swathi will report less issues with her sleep with less awakening early am and the inability to go back to sleep.      Interventions Encouraged to attend Pulmonary Rehabilitation for the exercise --      Continue Psychosocial Services  Follow up required by staff --             Psychosocial Discharge (Final Psychosocial Re-Evaluation):  Psychosocial Re-Evaluation - 11/18/19 1357      Psychosocial Re-Evaluation   Expected Outcomes Milli will report less issues with her sleep with less awakening early am and the inability to go back to sleep.           Education: Education Goals: Education classes will be provided on a weekly basis, covering required topics. Participant will state understanding/return  demonstration of topics presented.  Learning Barriers/Preferences:  Learning Barriers/Preferences - 11/06/19 1400      Learning Barriers/Preferences   Learning Barriers Hearing    Learning Preferences Verbal Instruction;Individual Instruction;Written Material           Education Topics: Risk Factor Reduction:  -Group instruction that is supported by a PowerPoint presentation. Instructor discusses the definition of a risk factor, different risk factors for pulmonary disease, and how the heart and lungs work together.     PULMONARY REHAB OTHER RESPIRATORY from 11/19/2019 in [place]  Date 11/19/19  Educator Handout      Nutrition for Pulmonary Patient:  -Group instruction provided by PowerPoint slides, verbal discussion, and written materials to support subject matter. The instructor gives an explanation and review of healthy diet recommendations, which includes a discussion on weight  management, recommendations for fruit and vegetable consumption, as well as protein, fluid, caffeine, fiber, sodium, sugar, and alcohol. Tips for eating when patients are short of breath are discussed.   Pursed Lip Breathing:  -Group instruction that is supported by demonstration and informational handouts. Instructor discusses the benefits of pursed lip and diaphragmatic breathing and detailed demonstration on how to preform both.     Oxygen Safety:  -Group instruction provided by PowerPoint, verbal discussion, and written material to support subject matter. There is an overview of "What is Oxygen" and "Why do we need it".  Instructor also reviews how to create a safe environment for oxygen use, the importance of using oxygen as prescribed, and the risks of noncompliance. There is a brief discussion on traveling with oxygen and resources the patient may utilize.   Oxygen Equipment:  -Group instruction provided by [facility] Staff utilizing handouts, written materials,  and equipment demonstrations.   Signs and Symptoms:  -Group instruction provided by written material and verbal discussion to support subject matter. Warning signs and symptoms of infection, stroke, and heart attack are reviewed and when to call the physician/911 reinforced. Tips for preventing the spread of infection discussed.   Advanced Directives:  -Group instruction provided by verbal instruction and written material to support subject matter. Instructor reviews Advanced Directive laws and proper instruction for filling out document.   Pulmonary Video:  -Group video education that reviews the importance of medication and oxygen compliance, exercise, good nutrition, pulmonary hygiene, and pursed lip and diaphragmatic breathing for the pulmonary patient.   Exercise for the Pulmonary Patient:  -Group instruction that is supported by a PowerPoint presentation. Instructor discusses benefits of exercise, core components of exercise, frequency, duration, and intensity of an exercise routine, importance of utilizing pulse oximetry during exercise, safety while exercising, and options of places to exercise outside of rehab.     Pulmonary Medications:  -Verbally interactive group education provided by instructor with focus on inhaled medications and proper administration.   Anatomy and Physiology of the Respiratory System and Intimacy:  -Group instruction provided by PowerPoint, verbal discussion, and written material to support subject matter. Instructor reviews respiratory cycle and anatomical components of the respiratory system and their functions. Instructor also reviews differences in obstructive and restrictive respiratory diseases with examples of each. Intimacy, Sex, and Sexuality differences are reviewed with a discussion on how relationships can change when diagnosed with pulmonary disease. Common sexual concerns are reviewed.   MD DAY -A group question and answer session with a  medical doctor that allows participants to ask questions that relate to their pulmonary disease state.   OTHER EDUCATION -Group or individual verbal, written, or video instructions that support the educational goals of the pulmonary rehab program.   PULMONARY REHAB OTHER RESPIRATORY from 11/19/2019 in [place]  Date 11/17/19  Jeannie Fend My Plate]  Educator Ailene Ravel H/O  Instruction Review Code 1- Verbalizes Understanding      Holiday Eating Survival Tips:  -Group instruction provided by PowerPoint slides, verbal discussion, and written materials to support subject matter. The instructor gives patients tips, tricks, and techniques to help them not only survive but enjoy the holidays despite the onslaught of food that accompanies the holidays.   Knowledge Questionnaire Score:  Knowledge Questionnaire Score - 11/06/19 1548      Knowledge Questionnaire Score   Pre Score 16/18           Core Components/Risk Factors/Patient Goals at Admission:  Personal Goals and  Risk Factors at Admission - 11/06/19 1401      Core Components/Risk Factors/Patient Goals on Admission   Improve shortness of breath with ADL's Yes    Intervention Provide education, individualized exercise plan and daily activity instruction to help decrease symptoms of SOB with activities of daily living.    Expected Outcomes Short Term: Improve cardiorespiratory fitness to achieve a reduction of symptoms when performing ADLs;Long Term: Be able to perform more ADLs without symptoms or delay the onset of symptoms    Lipids Yes    Intervention Provide education and support for participant on nutrition & aerobic/resistive exercise along with prescribed medications to achieve LDL <18m, HDL >422m    Expected Outcomes Short Term: Participant states understanding of desired cholesterol values and is compliant with medications prescribed. Participant is following exercise prescription and nutrition  guidelines.;Long Term: Cholesterol controlled with medications as prescribed, with individualized exercise RX and with personalized nutrition plan. Value goals: LDL < 7074mHDL > 40 mg.           Core Components/Risk Factors/Patient Goals Review:   Goals and Risk Factor Review    Row Name 11/18/19 1402             Core Components/Risk Factors/Patient Goals Review   Personal Goals Review Develop more efficient breathing techniques such as purse lipped breathing and diaphragmatic breathing and practicing self-pacing with activity.;Improve shortness of breath with ADL's;Increase knowledge of respiratory medications and ability to use respiratory devices properly.       Review JudYael off to a great start and has completed 3 exercise sessions. Working on breathing techniques and pt admits it is hard to "think" about it doesn't feel natural. Advised pt that the more she performs this it will become second natured. Will continue to work with JudCharlett Nose this.  JudElayne learning to pace herself with exercise, she exercises daily with walking 2 1/2 miles a day.  Presently on the sci fit bike level 2.0 with METS 4.8 and Treadmill 3.0/1.0. JudArnette able to maintain her O2 saturation above 88 - this is an improvement as her O2 sat would decrease with any incline.       Expected Outcomes See Admission Goals              Core Components/Risk Factors/Patient Goals at Discharge (Final Review):   Goals and Risk Factor Review - 11/18/19 1402      Core Components/Risk Factors/Patient Goals Review   Personal Goals Review Develop more efficient breathing techniques such as purse lipped breathing and diaphragmatic breathing and practicing self-pacing with activity.;Improve shortness of breath with ADL's;Increase knowledge of respiratory medications and ability to use respiratory devices properly.    Review JudJodine off to a great start and has completed 3 exercise sessions. Working on breathing techniques  and pt admits it is hard to "think" about it doesn't feel natural. Advised pt that the more she performs this it will become second natured. Will continue to work with JudCharlett Nose this.  JudEllin learning to pace herself with exercise, she exercises daily with walking 2 1/2 miles a day.  Presently on the sci fit bike level 2.0 with METS 4.8 and Treadmill 3.0/1.0. JudZayleigh able to maintain her O2 saturation above 88 - this is an improvement as her O2 sat would decrease with any incline.    Expected Outcomes See Admission Goals           ITP Comments:   Comments:  Wilene has completed 4 exercise session in Pulmonary rehab. Pt maintains good attendance and consistent home exercise. Pulmonary rehab staff will  continue to monitor and reassess progress toward goals during her participation in Pulmonary Rehab. Cherre Huger, BSN Cardiac and Training and development officer

## 2019-11-19 NOTE — Progress Notes (Signed)
Daily Session Note  Patient Details  Name: Elizabeth Espinoza MRN: [id number] Date of Birth: [birth year] Referring Provider:     Pulmonary Rehab Walk Test from 11/06/2019 in [place]  Referring Provider Dr. Chase Caller      Encounter Date: 11/19/2019  Check In:  Session Check In - 11/19/19 1007      Check-In   Supervising physician immediately available to respond to emergencies [facility] immediately available    Physician(s) Dr. Darrick Meigs    Location [facility]    Staff Present Rosebud Poles, RN, BSN;Carlette Wilber Oliphant, RN, BSN;Gaynelle Pastrana Ysidro Evert, RN;Jessica Hassell Done, MS, ACSM-CEP, Exercise Physiologist    Virtual Visit No    Medication changes reported     No    Fall or balance concerns reported    No    Tobacco Cessation No Change    Warm-up and Cool-down Performed as group-led instruction    Resistance Training Performed Yes    VAD Patient? No    PAD/SET Patient? No      Pain Assessment   Currently in Pain? No/denies    Multiple Pain Sites No           Capillary Blood Glucose: No results found for this or any previous visit (from the past 24 hour(s)).    Social History   Tobacco Use  Smoking Status Never Smoker  Smokeless Tobacco Never Used    Goals Met:  Exercise tolerated well No report of cardiac concerns or symptoms Strength training completed today  Goals Unmet:  Not Applicable  Comments: Service time is from 0945 to 1055    Dr. Fransico Him is Medical Director for Cardiac Rehab at [facility].

## 2019-11-24 ENCOUNTER — Encounter (HOSPITAL_COMMUNITY)
Admission: RE | Admit: 2019-11-24 | Discharge: 2019-11-24 | Disposition: A | Discharge status: Home / Self Care | Payer: Medicare Other | Source: Ambulatory Visit | Attending: Internal Medicine | Admitting: Internal Medicine

## 2019-11-24 ENCOUNTER — Other Ambulatory Visit: Payer: Self-pay

## 2019-11-24 VITALS — Wt 145.7 lb

## 2019-11-24 DIAGNOSIS — J84112 Idiopathic pulmonary fibrosis: Secondary | ICD-10-CM | POA: Diagnosis not present

## 2019-11-24 NOTE — Progress Notes (Signed)
Daily Session Note  Patient Details  Name: Elizabeth Espinoza MRN: [id number] Date of Birth: Jul 15, 1940 Referring Provider:     Pulmonary Rehab Walk Test from 11/06/2019 in [place]  Referring Provider Dr. Chase Caller      Encounter Date: 11/24/2019  Check In:  Session Check In - 11/24/19 0952      Check-In   Supervising physician immediately available to respond to emergencies [facility] immediately available    Physician(s) Dr. Darrick Meigs    [facility]    Staff Present Rosebud Poles, RN, BSN;Kemiah Booz Ysidro Evert, RN;Jessica Hassell Done, MS, ACSM-CEP, Exercise Physiologist    Virtual Visit No    Medication changes reported     No    Fall or balance concerns reported    No    Tobacco Cessation No Change    Warm-up and Cool-down Performed as group-led instruction    Resistance Training Performed Yes    VAD Patient? No    PAD/SET Patient? No      Pain Assessment   Currently in Pain? No/denies    Multiple Pain Sites No           Capillary Blood Glucose: No results found for this or any previous visit (from the past 24 hour(s)).   Exercise Prescription Changes - 11/24/19 1100      Response to Exercise   Blood Pressure (Admit) 116/70    Blood Pressure (Exercise) 120/50    Blood Pressure (Exit) 106/60    Heart Rate (Admit) 79 bpm    Heart Rate (Exercise) 118 bpm    Heart Rate (Exit) 77 bpm    Oxygen Saturation (Admit) 98 %    Oxygen Saturation (Exercise) 93 %    Oxygen Saturation (Exit) 98 %    Rating of Perceived Exertion (Exercise) 11    Perceived Dyspnea (Exercise) 1.5    Duration Continue with 45 min of aerobic exercise without signs/symptoms of physical distress.    Intensity THRR unchanged      Progression   Progression Continue to progress workloads to maintain intensity without signs/symptoms of physical distress.      Resistance Training   Training Prescription No    Weight orange bands    Reps 10-15    Time  10 Minutes      Treadmill   MPH 3    Grade 2    Minutes 15      Bike   Level 2.5    Minutes 15    METs 5.8           Social History   Tobacco Use  Smoking Status Never Smoker  Smokeless Tobacco Never Used    Goals Met:  Exercise tolerated well No report of cardiac concerns or symptoms Strength training completed today  Goals Unmet:  Not Applicable  Comments: Service time is from 0945 to 1050    Dr. Fransico Him is Medical Director for Cardiac Rehab at [facility]

## 2019-11-26 ENCOUNTER — Encounter (HOSPITAL_COMMUNITY)
Admission: RE | Admit: 2019-11-26 | Discharge: 2019-11-26 | Disposition: A | Discharge status: Home / Self Care | Payer: Medicare Other | Source: Ambulatory Visit | Attending: Internal Medicine | Admitting: Internal Medicine

## 2019-11-26 ENCOUNTER — Other Ambulatory Visit: Payer: Self-pay

## 2019-11-26 DIAGNOSIS — J84112 Idiopathic pulmonary fibrosis: Secondary | ICD-10-CM | POA: Diagnosis not present

## 2019-11-26 NOTE — Progress Notes (Signed)
Daily Session Note  Patient Details  Name: Elizabeth Espinoza MRN: [id number] Date of Birth: [birth year] Referring Provider:     Pulmonary Rehab Walk Test from 11/06/2019 in [place]  Referring Provider Dr. Chase Caller      Encounter Date: 11/26/2019  Check In:  Session Check In - 11/26/19 1004      Check-In   Supervising physician immediately available to respond to emergencies [facility] immediately available    Physician(s) Dr. Verlon Au    Location [facility]    Staff Present Rosebud Poles, RN, Isaac Laud, MS, ACSM-CEP, Exercise Physiologist;Lisa Ysidro Evert, RN    Virtual Visit No    Medication changes reported     No    Fall or balance concerns reported    No    Tobacco Cessation No Change    Warm-up and Cool-down Performed as group-led instruction    Resistance Training Performed Yes    VAD Patient? No    PAD/SET Patient? No      Pain Assessment   Currently in Pain? No/denies    Multiple Pain Sites No           Capillary Blood Glucose: No results found for this or any previous visit (from the past 24 hour(s)).    Social History   Tobacco Use  Smoking Status Never Smoker  Smokeless Tobacco Never Used    Goals Met:  Proper associated with RPD/PD & O2 Sat Independence with exercise equipment Exercise tolerated well No report of cardiac concerns or symptoms Strength training completed today  Goals Unmet:  Not Applicable  Comments: Service time is from 0945 to 1100    Dr. Fransico Him is Medical Director for Cardiac Rehab at [facility].

## 2019-11-30 ENCOUNTER — Telehealth: Payer: Self-pay | Admitting: Pharmacist

## 2019-11-30 NOTE — Progress Notes (Signed)
Spoke with patient to remind her of her appointment with the clinical pharmacist Mendel Ryder on 12/01/2019

## 2019-12-01 ENCOUNTER — Other Ambulatory Visit: Payer: Self-pay

## 2019-12-01 ENCOUNTER — Encounter (HOSPITAL_COMMUNITY)
Admission: RE | Admit: 2019-12-01 | Discharge: 2019-12-01 | Disposition: A | Discharge status: Home / Self Care | Payer: Medicare Other | Source: Ambulatory Visit | Attending: Internal Medicine | Admitting: Internal Medicine

## 2019-12-01 DIAGNOSIS — J84112 Idiopathic pulmonary fibrosis: Secondary | ICD-10-CM

## 2019-12-01 NOTE — Progress Notes (Signed)
Daily Session Note  Patient Details  Name: Elizabeth Espinoza MRN: [id number] Date of Birth: [birth year] Referring Provider:     Pulmonary Rehab Walk Test from 11/06/2019 in [place]  Referring Provider Dr. Chase Caller      Encounter Date: 12/01/2019  Check In:  Session Check In - 12/01/19 1008      Check-In   Supervising physician immediately available to respond to emergencies [facility] immediately available    Physician(s) Dr. Verlon Au    Location [facility]    Staff Present Rosebud Poles, RN, BSN;Lisa Ysidro Evert, RN;Yittel Emrich Hassell Done, MS, ACSM-CEP, Exercise Physiologist    Virtual Visit No    Medication changes reported     No    Fall or balance concerns reported    No    Tobacco Cessation No Change    Warm-up and Cool-down Performed as group-led instruction    Resistance Training Performed Yes    VAD Patient? No    PAD/SET Patient? No      Pain Assessment   Currently in Pain? No/denies    Multiple Pain Sites No           Capillary Blood Glucose: No results found for this or any previous visit (from the past 24 hour(s)).    Social History   Tobacco Use  Smoking Status Never Smoker  Smokeless Tobacco Never Used    Goals Met:  Proper associated with RPD/PD & O2 Sat Independence with exercise equipment Exercise tolerated well No report of cardiac concerns or symptoms Strength training completed today  Goals Unmet:  Not Applicable  Comments: Service time is from 0945 to 30    Dr. Fransico Him is Medical Director for Cardiac Rehab at [facility].

## 2019-12-02 ENCOUNTER — Ambulatory Visit: Payer: Medicare Other | Admitting: Pharmacist

## 2019-12-02 DIAGNOSIS — E782 Mixed hyperlipidemia: Secondary | ICD-10-CM

## 2019-12-02 DIAGNOSIS — J849 Interstitial pulmonary disease, unspecified: Secondary | ICD-10-CM

## 2019-12-02 DIAGNOSIS — Z8679 Personal history of other diseases of the circulatory system: Secondary | ICD-10-CM

## 2019-12-02 DIAGNOSIS — K219 Gastro-esophageal reflux disease without esophagitis: Secondary | ICD-10-CM

## 2019-12-02 NOTE — Chronic Care Management (AMB) (Signed)
Chronic Care Management Pharmacy  Name: Elizabeth Espinoza  MRN: [id number] DOB: 07/04/1940  Chief Complaint/ HPI  Elizabeth Espinoza,  79 y.o. , female presents for their Initial CCM visit with the clinical pharmacist via telephone due to COVID-19 Pandemic.  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 Divorced once and remarried, now married 43 years. Considers herself pretty healthy, used to exercise 3 times a week before Oct 2019 when breathing issues started.  Office Visits: 07/06/19 Dr Quay Burow OV: chronic f/u; increased levothyroxine to 88 mcg. Add Pepcid 40 mg to PPI to control GERD.  11/18/18 Dr Quay Burow OV: shoulder pain after yardwork. Rec diclofenac gel, tizanidine. Hesitant to try tramadol.  Consult Visit: 06/11/19 Dr Chase Caller (pulmonary): ILD/IPF/bronchiectasis - continue Esbriet, ondansetron PRN for associated nausea. Spirometry due July 2021.  05/05/19 NP Volanda Napoleon (pulmonary): start Esbriet, f/u 4 wks LFT. 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 Continue hydroxychloroquine  04/20/19 NP Volanda Napoleon (pulmonary): urticaria, referred to allergy. Hold off on Esbriet until hives resolve.  04/09/19 Dr Toribio Harbour (pulm [facility]): initiation of Esbriet (ended up delayed due to urticaria as above)  02/25/19 Dr Fontaine No (dermatology): on hydroxychloroquine for scalp disorder.  12/29/18 Dr Stanford Breed (cardiology): seen for dyspnea, ECHO 05/2018 normal LV function, grade 2 diastolic dysfunction, cardiac CT minimal nonobstructive coronary disease. Chest CT Oct 2020 - aortic atherosclerosis and 2-vessel coronary artery atherosclerosis. CAD assessed as minimal, continue medical tx with statin and  aspirin.  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 Started pirfenidone, refer to pharmacist.   11/12/18 Dr Lorenso Courier (heme/onc): diffuse sclerosis of bones noted on CT scan; found no hematological concerns so no indication for bone marrow biopsy. All labs normal, no need for f/u.   Allergies  Allergen Reactions  . Caffeine     palpitations  . Chlorpheniramine-Pseudoeph     palpitations  . Maxifed     palpitations  . Pirfenidone Nausea Only    Esbriet Causes nausea and weakness   Medications: Outpatient Encounter Medications as of 12/02/2019  Medication Sig  . aspirin EC 81 MG tablet Take 1 tablet (81 mg total) by mouth daily.  . betamethasone, augmented, (DIPROLENE) 0.05 % lotion Apply 1 application topically 2 (two) times daily.  . Biotin 5000 MCG TABS Take 5,000 mcg by mouth daily.  . camphor-menthol (SARNA) lotion Apply 1 application topically as needed for itching.   . Carboxymethylcellul-Glycerin (REFRESH RELIEVA OP) Place 1 drop into both eyes every 6 (six) hours as needed (dry eyes).  . cetirizine (ZYRTEC) 10 MG tablet Take 10 mg by mouth daily.   . cholecalciferol (VITAMIN D3) 25 MCG (1000 UT) tablet Take 1,000 Units by mouth daily.  . clobetasol (TEMOVATE) 0.05 % external solution SMARTSIG:1 Milliliter(s) Topical Twice Daily  . esomeprazole (NEXIUM) 40 MG capsule Take 40 mg by mouth every morning.  . famotidine (PEPCID) 40 MG tablet Take 1 tablet (40 mg total) by mouth daily.  . fluticasone (FLONASE) 50 MCG/ACT nasal spray Place 1 spray into both nostrils daily as needed for allergies or rhinitis.  . hydroxychloroquine (PLAQUENIL) 200 MG tablet Take 200 mg by mouth 2 (two) times daily.  . hydrOXYzine (ATARAX/VISTARIL) 25 MG tablet Take 25 mg by  mouth 3 (three) times daily.   Marland Kitchen levothyroxine (SYNTHROID) 100 MCG tablet Take 1 tablet (100 mcg total) by mouth daily.  . magnesium oxide (MAG-OX) 400 MG tablet Take 400 mg by mouth daily.  . Multiple  Vitamins-Minerals (ZINC PO) Take 1 tablet by mouth.  . pravastatin (PRAVACHOL) 40 MG tablet TAKE 1 TABLET BY MOUTH  DAILY  . rOPINIRole (REQUIP) 0.5 MG tablet TAKE 2 TABLETS BY MOUTH  AFTER DINNER EACH NIGHT.  Marland Kitchen Saccharomyces boulardii (FLORASTOR PO) Take 1 capsule by mouth daily.  . vitamin B-12 (CYANOCOBALAMIN) 500 MCG tablet Take 500 mcg by mouth daily.  . [DISCONTINUED] loratadine (CLARITIN) 10 MG tablet Take 10 mg by mouth daily as needed.    . [DISCONTINUED] omeprazole (PRILOSEC) 40 MG capsule Take 1 capsule (40 mg total) by mouth daily.   No facility-administered encounter medications on file as of 12/02/2019.     Current Diagnosis/Assessment:   Goals Addressed            This Visit's Progress   . Pharmacy Care Plan       CARE PLAN ENTRY  Current Barriers:  . Chronic Disease Management support, education, and care coordination needs related to Hyperlipidemia, Coronary Artery Disease, and GERD, Interstitial lung disease   Hyperlipidemia/Coronary Artery Disease Lab Results  Component Value Date/Time   LDLCALC 85 07/06/2019 10:33 AM .  Pharmacist Clinical Goal(s): o Over the next 180 days, patient will work with PharmD and providers to maintain LDL goal < 100 . Current regimen:  o Pravastatin 40 mg at bedtime o Aspirin 81 mg daily . Interventions: o Discussed cholesterol goals and benefits of medication for prevention of heart attack and stroke . Patient self care activities - Over the next 180 days, patient will: o Continue medications as prescribed o Continue low cholesterol diet  Interstitial Lung Disease . Pharmacist Clinical Goal(s) o Over the next 180 days, patient will work with PharmD and providers to slow progression of lung disease . Current regimen:  o No medication . Interventions: o Discussed using risk/benefit analysis to decide whether to take a medication; side effects of Esbriet were not worth potential benefits in patient's case . Patient self  care activities - Over the next 180 days, patient will: o Continue medication as prescribed o Follow up with pulmonologist as scheduled  GERD . Pharmacist Clinical Goal(s) o Over the next 180 days, patient will work with PharmD and providers to reduce costs of antacid therapy . Current regimen:  o Esomeprazole 20 mg daily o Famotidine 20 mg at bedtime . Interventions: o Tier exception approved for Esomeprazole . Patient self care activities - Over the next 180 days, patient will: o Continue medication as directed  Medication management . Pharmacist Clinical Goal(s): o Over the next 180 days, patient will work with PharmD and providers to maintain optimal medication adherence . Current pharmacy: [facility] . Interventions o Comprehensive medication review performed. o Continue current medication management strategy . Patient self care activities - Over the next 180 days, patient will: o Focus on medication adherence by fill date o Take medications as prescribed o Report any questions or concerns to PharmD and/or provider(s)  Please see past updates related to this goal by clicking on the "Past Updates" button in the selected goal        Hyperlipidemia   LDL goal < 100  Lipid Panel     Component Value Date/Time   CHOL 168 07/06/2019 1033   TRIG 165.0 (  H) 07/06/2019 1033   HDL 49.40 07/06/2019 1033   Orr 85 07/06/2019 1033    The 10-year ASCVD risk score Mikey Bussing DC Jr., et al., 2013) is: 20.4%   Values used to calculate the score:     Age: 37 years     Sex: Female     Is Non-Hispanic African American: No     Diabetic: No     Tobacco smoker: No     Systolic Blood Pressure: 144 mmHg     Is BP treated: No     HDL Cholesterol: 49.4 mg/dL     Total Cholesterol: 168 mg/dL   Patient has failed these meds in past: n/a Patient is currently controlled on the following medications:  . Pravastatin 40 mg HS . Aspirin 81 mg daily  We discussed:   diet and exercise extensively; Cholesterol goals; benefits of statin for ASCVD risk reduction  Plan  Continue current medications and control with diet and exercise  ILD/bronchiectasis   Patient has failed these meds in past: n/a Patient is currently controlled on the following medications:  . No medications  We discussed:  Pt is no longer taking Esbriet due to side effects (nausea, low energy) and feels much better off the med; she reports her lung function is about the same; she is going to pulmonary rehab and walking 2.5 miles daily with her husband  Plan  Continue control with diet and exercise  GERD/Barrett's esophagus   Patient has failed these meds in past: omeprazole, pantoprazole Patient is currently controlled on the following medications:  . Esomperazole 20 mg daily - 8am . Famotidine 40 mg at bedtime  Ultra Flora probiotic  We discussed:  Esomeprazole was approved for tier exception in July through end of year 2021. Pt reports improvement in symptoms since starting famotidine  Plan  Continue current medications  Hypothyroidism   Lab Results  Component Value Date/Time   TSH 0.41 11/09/2019 01:13 PM   TSH 5.16 (H) 09/03/2019 04:24 PM   TSH 10.19 (H) 07/06/2019 10:33 AM   Patient has failed these meds in past: n/a Patient is currently controlled on the following medications:  . Synthroid 100 mcg daily 6am  We discussed:  Pt takes Synthroid at 6 am, and takes PPI 2 hours later with breakfast.  Plan  Continue current medications  Scalp disorder   Patient has failed these meds in past: n/a Patient is currently controlled on the following medications:   Hydroxychloroquine 200 mg BID after breakfast, dinner  Clobetasol 0.05% solution PRN  We discussed:  Pt started treatment for scalp disorder this year; she reports she was told she may not be on hydroxychloroquine long term. She follows with dermatologist.  Plan  Continue current medications    Vaccines   Reviewed and discussed patient's vaccination history.    Immunization History  Administered Date(s) Administered  . Influenza Split 11/07/2010  . Influenza Whole 10/30/2009, 11/27/2011  . Influenza, High Dose Seasonal PF 10/27/2012, 11/05/2013, 10/01/2017, 09/11/2018  . Influenza-Unspecified 11/24/2014, 10/07/2015, 10/08/2016  . PFIZER SARS-COV-2 Vaccination 03/14/2019, 04/06/2019, 10/27/2019  . Pneumococcal Conjugate-13 10/07/2015  . Pneumococcal Polysaccharide-23 07/02/2017  . Tdap 02/11/2012  . Zoster 01/09/2012  . Zoster Recombinat (Shingrix) 10/01/2017, 12/03/2017   We discussed: Pt got covid booster vaccine last month after antibody test came back negative - her pulmonologist had told her to get an antibody test prior to deciding on the booster. She wants to know if a positive antibody test would [facility] Previously her pulmonologist has been very cautious and asked her to avoid crowds. Advised her to discuss with him.  Plan  Up to date on vaccines Recommend discussing covid antibody test with pulmonologist  Medication Management   [facility] medications Uses pill box? Yes Pt endorses 99% compliance  We discussed: Pt uses mail [facility] for maintenance medications and [facility]. All medications are covered by insurance and affordable (with exception of OTC Nexium).   Plan  Continue current medication management strategy    Follow up: 6 month phone visit  Charlene Brooke, PharmD, [facility]  Clinical Pharmacist [place] [facility] [phone]

## 2019-12-02 NOTE — Patient Instructions (Signed)
Visit Information  Phone number for Pharmacist: [phone]  Goals Addressed            This Visit's Progress   . Pharmacy Care Plan       CARE PLAN ENTRY  Current Barriers:  . Chronic Disease Management support, education, and care coordination needs related to Hyperlipidemia, Coronary Artery Disease, and GERD, Interstitial lung disease   Hyperlipidemia/Coronary Artery Disease Lab Results  Component Value Date/Time   LDLCALC 85 07/06/2019 10:33 AM .  Pharmacist Clinical Goal(s): o Over the next 180 days, patient will work with PharmD and providers to maintain LDL goal < 100 . Current regimen:  o Pravastatin 40 mg at bedtime o Aspirin 81 mg daily . Interventions: o Discussed cholesterol goals and benefits of medication for prevention of heart attack and stroke . Patient self care activities - Over the next 180 days, patient will: o Continue medications as prescribed o Continue low cholesterol diet  Interstitial Lung Disease . Pharmacist Clinical Goal(s) o Over the next 180 days, patient will work with PharmD and providers to slow progression of lung disease . Current regimen:  o No medication . Interventions: o Discussed using risk/benefit analysis to decide whether to take a medication; side effects of Esbriet were not worth potential benefits in patient's case . Patient self care activities - Over the next 180 days, patient will: o Continue medication as prescribed o Follow up with pulmonologist as scheduled  GERD . Pharmacist Clinical Goal(s) o Over the next 180 days, patient will work with PharmD and providers to reduce costs of antacid therapy . Current regimen:  o Esomeprazole 20 mg daily o Famotidine 20 mg at bedtime . Interventions: o Tier exception approved for Esomeprazole . Patient self care activities - Over the next 180 days, patient will: o Continue medication as directed  Medication management . Pharmacist Clinical Goal(s): o Over the next 180  days, patient will work with PharmD and providers to maintain optimal medication adherence . [facility] pharmacy: [facility] . Interventions o Comprehensive medication review performed. o Continue current medication management strategy . Patient self care activities - Over the next 180 days, patient will: o Focus on medication adherence by fill date o Take medications as prescribed o Report any questions or concerns to PharmD and/or provider(s)  Please see past updates related to this goal by clicking on the "Past Updates" button in the selected goal        Patient verbalizes understanding of instructions provided today.  Telephone follow up appointment with pharmacy team member scheduled for: 6 months  Charlene Brooke, PharmD, [facility] Clinical Pharmacist [place] [facility] [phone]

## 2019-12-03 ENCOUNTER — Encounter (HOSPITAL_COMMUNITY)
Admission: RE | Admit: 2019-12-03 | Discharge: 2019-12-03 | Disposition: A | Discharge status: Home / Self Care | Payer: Medicare Other | Source: Ambulatory Visit | Attending: Internal Medicine | Admitting: Internal Medicine

## 2019-12-03 ENCOUNTER — Other Ambulatory Visit: Payer: Self-pay

## 2019-12-03 DIAGNOSIS — J84112 Idiopathic pulmonary fibrosis: Secondary | ICD-10-CM

## 2019-12-03 NOTE — Progress Notes (Signed)
Daily Session Note  Patient Details  Name: Elizabeth Espinoza MRN: [id number] Date of Birth: May 24, 1940 Referring Provider:     Pulmonary Rehab Walk Test from 11/06/2019 in [place]  Referring Provider Dr. Chase Caller      Encounter Date: 12/03/2019  Check In:  Session Check In - 12/03/19 1000      Check-In   Supervising physician immediately available to respond to emergencies [facility] immediately available    Physician(s) Dr. Wynelle Cleveland    Location [facility]    Staff Present Rosebud Poles, RN, BSN;Malori Myers Ysidro Evert, RN;Jessica Hassell Done, MS, ACSM-CEP, Exercise Physiologist    Virtual Visit No    Medication changes reported     No    Fall or balance concerns reported    No    Tobacco Cessation No Change    Warm-up and Cool-down Performed as group-led instruction    Resistance Training Performed Yes    VAD Patient? No    PAD/SET Patient? No      Pain Assessment   Currently in Pain? No/denies    Multiple Pain Sites No           Capillary Blood Glucose: No results found for this or any previous visit (from the past 24 hour(s)).    Social History   Tobacco Use  Smoking Status Never Smoker  Smokeless Tobacco Never Used    Goals Met:  Exercise tolerated well No report of cardiac concerns or symptoms Strength training completed today  Goals Unmet:  Not Applicable  Comments: Service time is from 0948 to [place] used 3 pound hand weights today instead of bands for stretches, tolerated well.   Dr. Fransico Him is Medical Director for Cardiac Rehab at [facility].

## 2019-12-08 ENCOUNTER — Other Ambulatory Visit: Payer: Self-pay

## 2019-12-08 ENCOUNTER — Encounter (HOSPITAL_COMMUNITY)
Admission: RE | Admit: 2019-12-08 | Discharge: 2019-12-08 | Disposition: A | Discharge status: Home / Self Care | Payer: Medicare Other | Source: Ambulatory Visit | Attending: Internal Medicine | Admitting: Internal Medicine

## 2019-12-08 VITALS — Wt 146.8 lb

## 2019-12-08 DIAGNOSIS — J84112 Idiopathic pulmonary fibrosis: Secondary | ICD-10-CM | POA: Diagnosis not present

## 2019-12-08 NOTE — Progress Notes (Signed)
Daily Session Note  Patient Details  Name: Elizabeth Espinoza MRN: [id number] Date of Birth: 11-11-1940 Referring Provider:     Pulmonary Rehab Walk Test from 11/06/2019 in [place]  Referring Provider Dr. Chase Caller      Encounter Date: 12/08/2019  Check In:  Session Check In - 12/08/19 1125      Check-In   Supervising physician immediately available to respond to emergencies [facility] immediately available    Physician(s) Dr. Ree Kida    [facility]    Staff Present Rosebud Poles, RN, Isaac Laud, MS, ACSM-CEP, Exercise Physiologist;Lisa Ysidro Evert, RN    Virtual Visit No    Medication changes reported     No    Fall or balance concerns reported    No    Tobacco Cessation No Change    Warm-up and Cool-down Performed as group-led instruction    Resistance Training Performed Yes    VAD Patient? No    PAD/SET Patient? No      Pain Assessment   Currently in Pain? No/denies    Multiple Pain Sites No           Capillary Blood Glucose: No results found for this or any previous visit (from the past 24 hour(s)).   Exercise Prescription Changes - 12/08/19 1100      Response to Exercise   Blood Pressure (Admit) 114/64    Blood Pressure (Exercise) 170/82    Blood Pressure (Exit) 112/62    Heart Rate (Admit) 67 bpm    Heart Rate (Exercise) 125 bpm    Heart Rate (Exit) 78 bpm    Oxygen Saturation (Admit) 99 %    Oxygen Saturation (Exercise) 88 %    Oxygen Saturation (Exit) 98 %    Rating of Perceived Exertion (Exercise) 12    Perceived Dyspnea (Exercise) 2    Duration Continue with 30 min of aerobic exercise without signs/symptoms of physical distress.    Intensity THRR unchanged      Progression   Progression Continue to progress workloads to maintain intensity without signs/symptoms of physical distress.    Average METs 6      Resistance Training   Training Prescription Yes    Weight orange bands     Reps 10-15    Time 10 Minutes      Treadmill   MPH 3.6    Grade 2    Minutes 15      Bike   Level 3    Minutes 15    METs 6           Social History   Tobacco Use  Smoking Status Never Smoker  Smokeless Tobacco Never Used    Goals Met:  Proper associated with RPD/PD & O2 Sat Independence with exercise equipment Exercise tolerated well No report of cardiac concerns or symptoms Strength training completed today  Goals Unmet:  Not Applicable  Comments: Service time is from 0945 to 1100    Dr. Fransico Him is Medical Director for Cardiac Rehab at [facility].

## 2019-12-09 NOTE — Telephone Encounter (Signed)
MR please advise. Thanks! 

## 2019-12-09 NOTE — Telephone Encounter (Signed)
I think Navah Grondin Radliff meant nov when she got booster instead of October. If is > 2 weeks since booster she can come in and do covid IgG. And yes if positive, it is sign of protection against covid

## 2019-12-10 ENCOUNTER — Other Ambulatory Visit: Payer: Self-pay

## 2019-12-10 ENCOUNTER — Encounter (HOSPITAL_COMMUNITY)
Admission: RE | Admit: 2019-12-10 | Discharge: 2019-12-10 | Disposition: A | Discharge status: Home / Self Care | Payer: Medicare Other | Source: Ambulatory Visit | Attending: Internal Medicine | Admitting: Internal Medicine

## 2019-12-10 ENCOUNTER — Telehealth: Payer: Self-pay | Admitting: Internal Medicine

## 2019-12-10 ENCOUNTER — Other Ambulatory Visit: Payer: Medicare Other

## 2019-12-10 DIAGNOSIS — J849 Interstitial pulmonary disease, unspecified: Secondary | ICD-10-CM

## 2019-12-10 DIAGNOSIS — J84112 Idiopathic pulmonary fibrosis: Secondary | ICD-10-CM | POA: Diagnosis not present

## 2019-12-10 NOTE — Progress Notes (Addendum)
I have reviewed a Home Exercise Prescription with Elizabeth Espinoza . Elizabeth Espinoza is currently exercising at home most days of the week. She has a stationary bike at home and uses that the days she does not walk outside with her husband. She also has hand weights and resistance bands at home that she uses at least 3x per week. The patient was advised to walk 5-6 days a week for 45 minutes.  Elizabeth Espinoza and I discussed how to progress their exercise prescription.  The patient stated that their goals were to remain healthy.  The patient stated that they understand the exercise prescription.  We reviewed exercise guidelines, target heart rate during exercise, RPE Scale, weather conditions, Rescue Inhaler use, endpoints for exercise, warmup and cool down.  Patient is encouraged to come to me with any questions. I will continue to follow up with the patient to assist them with progression and safety.    Rick Duff MS, ACSM CEP

## 2019-12-10 NOTE — Telephone Encounter (Signed)
Spoke with the pt  She is calling regarding email regarding covid antibody test  I have ordered lab to be drawn since it's been over 2 wks since her booster  Nothing further needed

## 2019-12-10 NOTE — Telephone Encounter (Signed)
Pt called about this as well  I spoke with her and let her know of MR's response  I have placed order for lab and nothing further needed

## 2019-12-10 NOTE — Progress Notes (Signed)
Daily Session Note  Patient Details  Name: Elizabeth Espinoza MRN: [id number] Date of Birth: [birth year] Referring Provider:     Pulmonary Rehab Walk Test from 11/06/2019 in [place]  Referring Provider Dr. Chase Caller      Encounter Date: 12/10/2019  Check In:  Session Check In - 12/10/19 1014      Check-In   Supervising physician immediately available to respond to emergencies [facility] immediately available    Physician(s) Dr. Algis Liming    [facility]    Staff Present Rosebud Poles, RN, BSN;Lisa Ysidro Evert, RN;Immaculate Crutcher Hassell Done, MS, ACSM-CEP, Exercise Physiologist    Virtual Visit No    Medication changes reported     No    Fall or balance concerns reported    No    Tobacco Cessation No Change    Warm-up and Cool-down Performed as group-led instruction    Resistance Training Performed Yes    VAD Patient? No    PAD/SET Patient? No      Pain Assessment   Currently in Pain? No/denies    Multiple Pain Sites No           Capillary Blood Glucose: No results found for this or any previous visit (from the past 24 hour(s)).    Social History   Tobacco Use  Smoking Status Never Smoker  Smokeless Tobacco Never Used    Goals Met:  Proper associated with RPD/PD & O2 Sat Independence with exercise equipment Exercise tolerated well Personal goals reviewed Strength training completed today  Goals Unmet:  Not Applicable  Comments: Service time is from 0945 to [place]    Dr. Fransico Him is Medical Director for Cardiac Rehab at [facility].

## 2019-12-11 LAB — SARS-COV-2 IGG: SARS-COV-2 IgG: 29.69

## 2019-12-14 ENCOUNTER — Telehealth: Payer: Self-pay | Admitting: Internal Medicine

## 2019-12-14 NOTE — Telephone Encounter (Signed)
Called and spoke with pt letting her know the results of labwork and recommendations per MR. Pt verbalized understanding. Nothing further needed.

## 2019-12-14 NOTE — Telephone Encounter (Signed)
  She has good antibody responmse to do covid from her booster on 10/27/19.  Plan  - recommend flu shot - there is some flu activity nationwide being registerd  - ok for safe thanskgiving from covid standpoint but there are other respirator viruses to contend with and activity has been detected - so make sure no one sick is attending thanksgiving and ideally attendeeds should have had covid shot +/- flu shot but at minimum they are not sick of come into contact with someone within 2-7 days prior to thanksgiving meal  Results for MEILI, KLECKLEY (MRN [id number]) as of 12/14/2019 06:35  Ref. Range 09/03/2019 16:24 09/15/2019 17:13 10/20/2019 10:39 11/09/2019 13:13 12/10/2019 16:06  SARS-COV-2 IgG Latest Ref Range: Non-Reactive      29.69   Immunization History  Administered Date(s) Administered  . Influenza Split 11/07/2010  . Influenza Whole 10/30/2009, 11/27/2011  . Influenza, High Dose Seasonal PF 10/27/2012, 11/05/2013, 10/01/2017, 09/11/2018  . Influenza-Unspecified 11/24/2014, 10/07/2015, 10/08/2016  . PFIZER SARS-COV-2 Vaccination 03/14/2019, 04/06/2019, 10/27/2019  . Pneumococcal Conjugate-13 10/07/2015  . Pneumococcal Polysaccharide-23 07/02/2017  . Tdap 02/11/2012  . Zoster 01/09/2012  . Zoster Recombinat (Shingrix) 10/01/2017, 12/03/2017

## 2019-12-14 NOTE — Progress Notes (Signed)
Pulmonary Individual Treatment Plan  Patient Details  Name: Elizabeth Espinoza MRN: [id number] Date of Birth: 1940/03/02 Referring Provider:     Pulmonary Rehab Walk Test from 11/06/2019 in [facility]  Referring Provider Dr. Chase Caller      Initial Encounter Date:    Pulmonary Rehab Walk Test from 11/06/2019 in [place]  Date 11/06/19      Visit Diagnosis: IPF (idiopathic pulmonary fibrosis) ([place])  Patient's Home Medications on Admission:   Current Outpatient Medications:  .  aspirin EC 81 MG tablet, Take 1 tablet (81 mg total) by mouth daily., Disp: 90 tablet, Rfl: 3 .  betamethasone, augmented, (DIPROLENE) 0.05 % lotion, Apply 1 application topically 2 (two) times daily., Disp: , Rfl:  .  Biotin 5000 MCG TABS, Take 5,000 mcg by mouth daily., Disp: , Rfl:  .  camphor-menthol (SARNA) lotion, Apply 1 application topically as needed for itching. , Disp: , Rfl:  .  Carboxymethylcellul-Glycerin (REFRESH RELIEVA OP), Place 1 drop into both eyes every 6 (six) hours as needed (dry eyes)., Disp: , Rfl:  .  cetirizine (ZYRTEC) 10 MG tablet, Take 10 mg by mouth daily. , Disp: , Rfl:  .  cholecalciferol (VITAMIN D3) 25 MCG (1000 UT) tablet, Take 1,000 Units by mouth daily., Disp: , Rfl:  .  clobetasol (TEMOVATE) 0.05 % external solution, SMARTSIG:1 Milliliter(s) Topical Twice Daily, Disp: , Rfl:  .  esomeprazole (NEXIUM) 40 MG capsule, Take 40 mg by mouth every morning., Disp: , Rfl:  .  famotidine (PEPCID) 40 MG tablet, Take 1 tablet (40 mg total) by mouth daily., Disp: 90 tablet, Rfl: 1 .  fluticasone (FLONASE) 50 MCG/ACT nasal spray, Place 1 spray into both nostrils daily as needed for allergies or rhinitis., Disp: , Rfl:  .  hydroxychloroquine (PLAQUENIL) 200 MG tablet, Take 200 mg by mouth 2 (two) times daily., Disp: , Rfl:  .  hydrOXYzine (ATARAX/VISTARIL) 25 MG tablet, Take 25 mg by mouth 3 (three) times daily. , Disp: , Rfl:    .  levothyroxine (SYNTHROID) 100 MCG tablet, Take 1 tablet (100 mcg total) by mouth daily., Disp: 90 tablet, Rfl: 3 .  magnesium oxide (MAG-OX) 400 MG tablet, Take 400 mg by mouth daily., Disp: , Rfl:  .  Multiple Vitamins-Minerals (ZINC PO), Take 1 tablet by mouth., Disp: , Rfl:  .  pravastatin (PRAVACHOL) 40 MG tablet, TAKE 1 TABLET BY MOUTH  DAILY, Disp: 90 tablet, Rfl: 3 .  rOPINIRole (REQUIP) 0.5 MG tablet, TAKE 2 TABLETS BY MOUTH  AFTER DINNER EACH NIGHT., Disp: 180 tablet, Rfl: 3 .  Saccharomyces boulardii (FLORASTOR PO), Take 1 capsule by mouth daily., Disp: , Rfl:  .  vitamin B-12 (CYANOCOBALAMIN) 500 MCG tablet, Take 500 mcg by mouth daily., Disp: , Rfl:   Past Medical History: Past Medical History:  Diagnosis Date  . Allergic rhinitis   . GERD (gastroesophageal reflux disease)   . Glaucoma    SUSPECT  . Hiatal hernia   . Hyperlipidemia    LDL goal = < 100  . Hypothyroidism   . ILD (interstitial lung disease) ([place])   . Lichen planopilaris   . MVP (mitral valve prolapse)    documented on 2 D ECHO  . Restless legs   . Urticaria     Tobacco Use: Social History   Tobacco Use  Smoking Status Never Smoker  Smokeless Tobacco Never Used    Labs: Recent Review Scientist, physiological  Labs for ITP Cardiac and Pulmonary Rehab Latest Ref Rng & Units 06/13/2015 06/27/2016 07/02/2017 07/04/2018 07/06/2019   Cholestrol 0 - 200 mg/dL - 160 156 168 168   LDLCALC 0 - 99 mg/dL - 84 80 88 85   HDL >39.00 mg/dL - 54.60 43.10 52.30 49.40   Trlycerides 0 - 149 mg/dL - 109.0 164.0(H) 136.0 165.0(H)   Hemoglobin A1c 4.6 - 6.5 % 6.1 6.0 6.0 6.2 6.1      Capillary Blood Glucose: No results found for: GLUCAP   Pulmonary Assessment Scores:  Pulmonary Assessment Scores    Row Name 11/06/19 1549         ADL UCSD   ADL Phase Entry     SOB Score total 51       CAT Score   CAT Score 20       mMRC Score   mMRC Score 2           UCSD: Self-administered rating of dyspnea associated  with activities of daily living (ADLs) 6-point scale (0 = "not at all" to 5 = "maximal or unable to do because of breathlessness")  Scoring Scores range from 0 to 120.  Minimally important difference is 5 units  CAT: CAT can identify the health impairment of COPD patients and is better correlated with disease progression.  CAT has a scoring range of zero to 40. The CAT score is classified into four groups of low (less than 10), medium (10 - 20), high (21-30) and very high (31-40) based on the impact level of disease on health status. A CAT score over 10 suggests significant symptoms.  A worsening CAT score could be explained by an exacerbation, poor medication adherence, poor inhaler technique, or progression of COPD or comorbid conditions.  CAT MCID is 2 points  mMRC: mMRC (Modified Medical Research Council) Dyspnea Scale is used to assess the degree of baseline functional disability in patients of respiratory disease due to dyspnea. No minimal important difference is established. A decrease in score of 1 point or greater is considered a positive change.   Pulmonary Function Assessment:   Exercise Target Goals: Exercise Program Goal: Individual exercise prescription set using results from initial 6 min walk test and THRR while considering  patient's activity barriers and safety.   Exercise Prescription Goal: Initial exercise prescription builds to 30-45 minutes a day of aerobic activity, 2-3 days per week.  Home exercise guidelines will be given to patient during program as part of exercise prescription that the participant will acknowledge.  Activity Barriers & Risk Stratification:  Activity Barriers & Cardiac Risk Stratification - 11/06/19 1350      Activity Barriers & Cardiac Risk Stratification   Cardiac Risk Stratification Moderate           6 Minute Walk:  6 Minute Walk    Row Name 11/06/19 1552         6 Minute Walk   Phase Initial     Distance 1412 feet     Walk  Time 6 minutes     # of Rest Breaks 2  Had to stop her twice because her oxygen saturation dropped below 88%     MPH 2.67     METS 3.21     RPE 11     Perceived Dyspnea  1     VO2 Peak 11.24     Symptoms Yes (comment)  Oxygen saturation dropped to 83%     Resting HR 80 bpm  Resting BP 124/70     Resting Oxygen Saturation  99 %     Exercise Oxygen Saturation  during 6 min walk 83 %     Max Ex. HR 117 bpm     Max Ex. BP 154/70     2 Minute Post BP 116/68       Interval HR   1 Minute HR 78     2 Minute HR 105     3 Minute HR 114     4 Minute HR 114     5 Minute HR 117     6 Minute HR 116     2 Minute Post HR 72     Interval Heart Rate? Yes       Interval Oxygen   Interval Oxygen? Yes     Baseline Oxygen Saturation % 99 %     1 Minute Oxygen Saturation % 84 %     1 Minute Liters of Oxygen 0 L     2 Minute Oxygen Saturation % 83 %     2 Minute Liters of Oxygen 0 L     3 Minute Oxygen Saturation % 85 %     3 Minute Liters of Oxygen 0 L     4 Minute Oxygen Saturation % 88 %     4 Minute Liters of Oxygen 0 L     5 Minute Oxygen Saturation % 91 %     5 Minute Liters of Oxygen 0 L     6 Minute Oxygen Saturation % 87 %     6 Minute Liters of Oxygen 0 L     2 Minute Post Oxygen Saturation % 100 %     2 Minute Post Liters of Oxygen 0 L            Oxygen Initial Assessment:  Oxygen Initial Assessment - 11/06/19 1627      Home Oxygen   Home Oxygen Device None    Sleep Oxygen Prescription None    Home Exercise Oxygen Prescription None    Home Resting Oxygen Prescription None      Initial 6 min Walk   Oxygen Used None      Program Oxygen Prescription   Program Oxygen Prescription None      Intervention   Short Term Goals To learn and exhibit compliance with exercise, home and travel O2 prescription;To learn and understand importance of monitoring SPO2 with pulse oximeter and demonstrate accurate use of the pulse oximeter.;To learn and understand importance of  maintaining oxygen saturations>88%;To learn and demonstrate proper pursed lip breathing techniques or other breathing techniques.;To learn and demonstrate proper use of respiratory medications    Long  Term Goals Compliance with respiratory medication;Exhibits proper breathing techniques, such as pursed lip breathing or other method taught during program session;Maintenance of O2 saturations>88%;Verbalizes importance of monitoring SPO2 with pulse oximeter and return demonstration;Exhibits compliance with exercise, home and travel O2 prescription           Oxygen Re-Evaluation:  Oxygen Re-Evaluation    Row Name 11/19/19 2297 12/10/19 0845           Program Oxygen Prescription   Program Oxygen Prescription None None        Home Oxygen   Home Oxygen Device None None      Sleep Oxygen Prescription None None      Home Exercise Oxygen Prescription None None      Home Resting Oxygen Prescription None None  Goals/Expected Outcomes   Short Term Goals To learn and exhibit compliance with exercise, home and travel O2 prescription;To learn and understand importance of monitoring SPO2 with pulse oximeter and demonstrate accurate use of the pulse oximeter.;To learn and understand importance of maintaining oxygen saturations>88%;To learn and demonstrate proper pursed lip breathing techniques or other breathing techniques.;To learn and demonstrate proper use of respiratory medications To learn and exhibit compliance with exercise, home and travel O2 prescription;To learn and understand importance of monitoring SPO2 with pulse oximeter and demonstrate accurate use of the pulse oximeter.;To learn and understand importance of maintaining oxygen saturations>88%;To learn and demonstrate proper pursed lip breathing techniques or other breathing techniques.;To learn and demonstrate proper use of respiratory medications      Long  Term Goals Compliance with respiratory medication;Exhibits proper breathing  techniques, such as pursed lip breathing or other method taught during program session;Maintenance of O2 saturations>88%;Verbalizes importance of monitoring SPO2 with pulse oximeter and return demonstration;Exhibits compliance with exercise, home and travel O2 prescription Compliance with respiratory medication;Exhibits proper breathing techniques, such as pursed lip breathing or other method taught during program session;Maintenance of O2 saturations>88%;Verbalizes importance of monitoring SPO2 with pulse oximeter and return demonstration;Exhibits compliance with exercise, home and travel O2 prescription      Goals/Expected Outcomes Compliance and understanding of oxygen saturation and pursed lip breathing Compliance and understanding of oxygen saturation and pursed lip breathing             Oxygen Discharge (Final Oxygen Re-Evaluation):  Oxygen Re-Evaluation - 12/10/19 0845      Program Oxygen Prescription   Program Oxygen Prescription None      Home Oxygen   Home Oxygen Device None    Sleep Oxygen Prescription None    Home Exercise Oxygen Prescription None    Home Resting Oxygen Prescription None      Goals/Expected Outcomes   Short Term Goals To learn and exhibit compliance with exercise, home and travel O2 prescription;To learn and understand importance of monitoring SPO2 with pulse oximeter and demonstrate accurate use of the pulse oximeter.;To learn and understand importance of maintaining oxygen saturations>88%;To learn and demonstrate proper pursed lip breathing techniques or other breathing techniques.;To learn and demonstrate proper use of respiratory medications    Long  Term Goals Compliance with respiratory medication;Exhibits proper breathing techniques, such as pursed lip breathing or other method taught during program session;Maintenance of O2 saturations>88%;Verbalizes importance of monitoring SPO2 with pulse oximeter and return demonstration;Exhibits compliance with  exercise, home and travel O2 prescription    Goals/Expected Outcomes Compliance and understanding of oxygen saturation and pursed lip breathing           Initial Exercise Prescription:  Initial Exercise Prescription - 11/06/19 1600      Date of Initial Exercise RX and Referring Provider   Date 11/06/19    Referring Provider Dr. Chase Caller    Expected Discharge Date 01/07/20      Treadmill   Minutes 15      Bike   Level 2    Minutes 15      Prescription Details   Frequency (times per week) 2    Duration Progress to 30 minutes of continuous aerobic without signs/symptoms of physical distress      Intensity   THRR 40-80% of Max Heartrate 56-113    Ratings of Perceived Exertion 11-13    Perceived Dyspnea 0-4      Progression   Progression Continue to progress workloads to maintain intensity without signs/symptoms of physical  distress.      Resistance Training   Training Prescription Yes    Weight orange bands    Reps 10-15           Perform Capillary Blood Glucose checks as needed.  Exercise Prescription Changes:  Exercise Prescription Changes    Row Name 11/19/19 0700 11/24/19 1100 12/08/19 1100 12/10/19 1100       Response to Exercise   Blood Pressure (Admit) 138/76 116/70 114/64 --    Blood Pressure (Exercise) 142/70 120/50 170/82 --    Blood Pressure (Exit) 120/64 106/60 112/62 --    Heart Rate (Admit) 73 bpm 79 bpm 67 bpm --    Heart Rate (Exercise) 111 bpm 118 bpm 125 bpm --    Heart Rate (Exit) 66 bpm 77 bpm 78 bpm --    Oxygen Saturation (Admit) 99 % 98 % 99 % --    Oxygen Saturation (Exercise) 91 % 93 % 88 % --    Oxygen Saturation (Exit) 97 % 98 % 98 % --    Rating of Perceived Exertion (Exercise) _0 --    Perceived Dyspnea (Exercise) 2 1.5 2 --    Duration Continue with 30 min of aerobic exercise without signs/symptoms of physical distress. Continue with 45 min of aerobic exercise without signs/symptoms of physical distress. Continue with 30  min of aerobic exercise without signs/symptoms of physical distress. --    Intensity Other (comment)  40%-80% HR Max THRR unchanged THRR unchanged --      Progression   Progression Continue to progress workloads to maintain intensity without signs/symptoms of physical distress. Continue to progress workloads to maintain intensity without signs/symptoms of physical distress. Continue to progress workloads to maintain intensity without signs/symptoms of physical distress. --    Average METs 4.4 -- 6 --      Resistance Training   Training Prescription Yes No Yes --    Weight orange bands orange bands orange bands --    Reps 10-15 10-15 10-15 --    Time 10 Minutes 10 Minutes 10 Minutes --      Treadmill   MPH 3 3 3.6 --    Grade _1 --    Minutes _2 --      Bike   Level 2 2.5 3 --    Minutes _3 --    METs 4.8 5.8 6 --      Home Exercise Plan   Plans to continue exercise at -- -- -- Home (comment)  Walking, Recumbent Bike, and Hand weights    Frequency -- -- -- Add 3 additional days to program exercise sessions.    Initial Home Exercises Provided -- -- -- 12/10/19           Exercise Comments:  Exercise Comments    Row Name 11/10/19 1213 12/10/19 1146         Exercise Comments Pt completed first day of exercise and tolerated exercise well. She had no complaints or concerns. She is a very active person and is able to do the upright bike and treadmill. We will continue to follow and progress her as she is able. Completed home exercise with pt. Pt is very active at home. Gave her ways to make progressions with her current home exercise. Pt was receptive.             Exercise Goals and Review:  Exercise Goals    Row Name 11/06/19 1551  Exercise Goals   Increase Physical Activity Yes       Intervention Provide advice, education, support and counseling about physical activity/exercise needs.;Develop an individualized exercise prescription for aerobic  and resistive training based on initial evaluation findings, risk stratification, comorbidities and participant's personal goals.       Expected Outcomes Short Term: Attend rehab on a regular basis to increase amount of physical activity.;Long Term: Add in home exercise to make exercise part of routine and to increase amount of physical activity.;Long Term: Exercising regularly at least 3-5 days a week.       Increase Strength and Stamina Yes       Intervention Provide advice, education, support and counseling about physical activity/exercise needs.;Develop an individualized exercise prescription for aerobic and resistive training based on initial evaluation findings, risk stratification, comorbidities and participant's personal goals.       Expected Outcomes Short Term: Increase workloads from initial exercise prescription for resistance, speed, and METs.;Short Term: Perform resistance training exercises routinely during rehab and add in resistance training at home;Long Term: Improve cardiorespiratory fitness, muscular endurance and strength as measured by increased METs and functional capacity (6MWT)       Able to understand and use rate of perceived exertion (RPE) scale Yes       Intervention Provide education and explanation on how to use RPE scale       Expected Outcomes Short Term: Able to use RPE daily in rehab to express subjective intensity level;Long Term:  Able to use RPE to guide intensity level when exercising independently       Able to understand and use Dyspnea scale Yes       Intervention Provide education and explanation on how to use Dyspnea scale       Expected Outcomes Short Term: Able to use Dyspnea scale daily in rehab to express subjective sense of shortness of breath during exertion;Long Term: Able to use Dyspnea scale to guide intensity level when exercising independently       Knowledge and understanding of Target Heart Rate Range (THRR) Yes       Intervention Provide education  and explanation of THRR including how the numbers were predicted and where they are located for reference       Expected Outcomes Short Term: Able to state/look up THRR;Long Term: Able to use THRR to govern intensity when exercising independently;Short Term: Able to use daily as guideline for intensity in rehab       Understanding of Exercise Prescription Yes       Intervention Provide education, explanation, and written materials on patient's individual exercise prescription       Expected Outcomes Short Term: Able to explain program exercise prescription;Long Term: Able to explain home exercise prescription to exercise independently              Exercise Goals Re-Evaluation :  Exercise Goals Re-Evaluation    Row Name 11/19/19 0710 12/10/19 0841           Exercise Goal Re-Evaluation   Exercise Goals Review Increase Physical Activity;Increase Strength and Stamina;Able to understand and use rate of perceived exertion (RPE) scale;Able to understand and use Dyspnea scale;Knowledge and understanding of Target Heart Rate Range (THRR);Understanding of Exercise Prescription Increase Physical Activity;Increase Strength and Stamina;Able to understand and use rate of perceived exertion (RPE) scale;Able to understand and use Dyspnea scale;Knowledge and understanding of Target Heart Rate Range (THRR);Understanding of Exercise Prescription      Comments Pt has completed 3 exercise  sessions and has tolerated very well. Pt is very motivated and pushes herself to make progressions. She is currently exercisisng at 4.8 METS on the Bike. Will continue to monitor and progress as able. Pt has completed 9 exercise sessions and has been making very good progression with workloads and METS. She is very active and very motivated to exercise. She is exercising at 6.0 METS on the bike and 6.7 METS on the treadmill. Will continue to monitor and progress as able.      Expected Outcomes Through exercise at rehab and home the  patient will decrease shortness of breath with daily activities and feel confident in carrying out an exercise regimn at home. Through exercise at rehab and home the patient will decrease shortness of breath with daily activities and feel confident in carrying out an exercise regimn at home.             Discharge Exercise Prescription (Final Exercise Prescription Changes):  Exercise Prescription Changes - 12/10/19 1100      Home Exercise Plan   Plans to continue exercise at Home (comment)   Walking, Recumbent Bike, and Hand weights   Frequency Add 3 additional days to program exercise sessions.    Initial Home Exercises Provided 12/10/19           Nutrition:  Target Goals: Understanding of nutrition guidelines, daily intake of sodium <1521m, cholesterol <2026m calories 30% from fat and 7% or less from saturated fats, daily to have 5 or more servings of fruits and vegetables.  Biometrics:  Pre Biometrics - 11/06/19 1603      Pre Biometrics   Grip Strength 34 kg            Nutrition Therapy Plan and Nutrition Goals:  Nutrition Therapy & Goals - 11/17/19 1335      Nutrition Therapy   Diet General healthful      Personal Nutrition Goals   Nutrition Goal Pt to continue to build a healthy plate including vegetables, fruits, whole grains, and low-fat dairy products in a generally healthy meal plan.      Intervention Plan   Intervention Prescribe, educate and counsel regarding individualized specific dietary modifications aiming towards targeted core components such as weight, hypertension, lipid management, diabetes, heart failure and other comorbidities.    Expected Outcomes Short Term Goal: Understand basic principles of dietary content, such as calories, fat, sodium, cholesterol and nutrients.           Nutrition Assessments:  Nutrition Assessments - 11/17/19 1336      Rate Your Plate Scores   Pre Score 60          MEDIFICTS Score Key:  ?70 Need to make  dietary changes   40-70 Heart Healthy Diet  ? 40 Therapeutic Level Cholesterol Diet   Picture Your Plate Scores:  <4<40nhealthy dietary pattern with much room for improvement.  41-50 Dietary pattern unlikely to meet recommendations for good health and room for improvement.  51-60 More healthful dietary pattern, with some room for improvement.   >60 Healthy dietary pattern, although there may be some specific behaviors that could be improved.    Nutrition Goals Re-Evaluation:  Nutrition Goals Re-Evaluation    [place] 11/17/19 1336 12/10/19 0738           Goals   Current Weight 149 lb (67.6 kg) 146 lb 13.2 oz (66.6 kg)      Nutrition Goal Pt to continue to build a healthy plate including vegetables, fruits, whole  grains, and low-fat dairy products in a generally healthy meal plan. Pt to continue to build a healthy plate including vegetables, fruits, whole grains, and low-fat dairy products in a generally healthy meal plan.      Comment -- Will reassess diet with post Rate My Plate survey as pt follows a healthy diet             Nutrition Goals Discharge (Final Nutrition Goals Re-Evaluation):  Nutrition Goals Re-Evaluation - 12/10/19 0738      Goals   Current Weight 146 lb 13.2 oz (66.6 kg)    Nutrition Goal Pt to continue to build a healthy plate including vegetables, fruits, whole grains, and low-fat dairy products in a generally healthy meal plan.    Comment Will reassess diet with post Rate My Plate survey as pt follows a healthy diet           Psychosocial: Target Goals: Acknowledge presence or absence of significant depression and/or stress, maximize coping skills, provide positive support system. Participant is able to verbalize types and ability to use techniques and skills needed for reducing stress and depression.  Initial Review & Psychosocial Screening:  Initial Psych Review & Screening - 11/06/19 1353      Initial Review   Current issues with None  Identified;Current Sleep Concerns      Family Dynamics   Good Support System? Yes    Comments supportive husband of 88 + years      Barriers   Psychosocial barriers to participate in program The patient should benefit from training in stress management and relaxation.      Screening Interventions   Interventions Encouraged to exercise           Quality of Life Scores:  Scores of 19 and below usually indicate a poorer quality of life in these areas.  A difference of  2-3 points is a clinically meaningful difference.  A difference of 2-3 points in the total score of the Quality of Life Index has been associated with significant improvement in overall quality of life, self-image, physical symptoms, and general health in studies assessing change in quality of life.  PHQ-9: Recent Review Flowsheet Data    Depression screen [facility] 2/9 11/06/2019 11/06/2019 07/09/2019 07/04/2018 07/03/2018   Decreased Interest 0 0 0 0 0   Down, Depressed, Hopeless 0 0 0 0 0   PHQ - 2 Score 0 0 0 0 0   Altered sleeping 3 - - - -   Tired, decreased energy 3 - - - -   Change in appetite 0 - - - -   Feeling bad or failure about yourself  0 - - - -   Trouble concentrating 0 - - - -   Moving slowly or fidgety/restless 0 - - - -   Suicidal thoughts 0 - - - -   PHQ-9 Score 6 - - - -   Difficult doing work/chores Not difficult at all - - - -     Interpretation of Total Score  Total Score Depression Severity:  1-4 = Minimal depression, 5-9 = Mild depression, 10-14 = Moderate depression, 15-19 = Moderately severe depression, 20-27 = Severe depression   Psychosocial Evaluation and Intervention:  Psychosocial Evaluation - 11/18/19 1357      Psychosocial Evaluation & Interventions   Expected Outcomes Herbert will report less issues with her sleep with less awakening early am and the inability to go back to sleep.  Psychosocial Re-Evaluation:  Psychosocial Re-Evaluation    [place] Name 11/18/19 1356  11/18/19 1357 12/14/19 1009         Psychosocial Re-Evaluation   Current issues with Current Sleep Concerns -- Current Sleep Concerns     Comments Jamirah reports that she awakens easily and has difficutly getting back to sleep. -- No psychosocial concerns identified at this time, her sleep concerns are not linked to depression, she occassionally awakens from sleep and has trouble going back to sleep.  The sleep concerns are unchanged while participating in pulmonary rehab.     Expected Outcomes -- Nastasha will report less issues with her sleep with less awakening early am and the inability to go back to sleep. For Julieana to continue to have no psychosocial concerns while participating in pulmonary rehab.     Interventions Encouraged to attend Pulmonary Rehabilitation for the exercise -- Encouraged to attend Pulmonary Rehabilitation for the exercise;Relaxation education     Continue Psychosocial Services  Follow up required by staff -- No Follow up required            Psychosocial Discharge (Final Psychosocial Re-Evaluation):  Psychosocial Re-Evaluation - 12/14/19 1009      Psychosocial Re-Evaluation   Current issues with Current Sleep Concerns    Comments No psychosocial concerns identified at this time, her sleep concerns are not linked to depression, she occassionally awakens from sleep and has trouble going back to sleep.  The sleep concerns are unchanged while participating in pulmonary rehab.    Expected Outcomes For Nema to continue to have no psychosocial concerns while participating in pulmonary rehab.    Interventions Encouraged to attend Pulmonary Rehabilitation for the exercise;Relaxation education    Continue Psychosocial Services  No Follow up required           Education: Education Goals: Education classes will be provided on a weekly basis, covering required topics. Participant will state understanding/return demonstration of topics presented.  Learning  Barriers/Preferences:  Learning Barriers/Preferences - 11/06/19 1400      Learning Barriers/Preferences   Learning Barriers Hearing    Learning Preferences Verbal Instruction;Individual Instruction;Written Material           Education Topics: Risk Factor Reduction:  -Group instruction that is supported by a PowerPoint presentation. Instructor discusses the definition of a risk factor, different risk factors for pulmonary disease, and how the heart and lungs work together.     PULMONARY REHAB OTHER RESPIRATORY from 12/10/2019 in [place]  Date 11/19/19  Educator Handout      Nutrition for Pulmonary Patient:  -Group instruction provided by PowerPoint slides, verbal discussion, and written materials to support subject matter. The instructor gives an explanation and review of healthy diet recommendations, which includes a discussion on weight management, recommendations for fruit and vegetable consumption, as well as protein, fluid, caffeine, fiber, sodium, sugar, and alcohol. Tips for eating when patients are short of breath are discussed.   Pursed Lip Breathing:  -Group instruction that is supported by demonstration and informational handouts. Instructor discusses the benefits of pursed lip and diaphragmatic breathing and detailed demonstration on how to preform both.     Oxygen Safety:  -Group instruction provided by PowerPoint, verbal discussion, and written material to support subject matter. There is an overview of "What is Oxygen" and "Why do we need it".  Instructor also reviews how to create a safe environment for oxygen use, the importance of using oxygen as prescribed, and the risks of noncompliance.  There is a brief discussion on traveling with oxygen and resources the patient may utilize.   Oxygen Equipment:  -Group instruction provided by [facility] Staff utilizing handouts, written materials, and equipment demonstrations.   Signs and  Symptoms:  -Group instruction provided by written material and verbal discussion to support subject matter. Warning signs and symptoms of infection, stroke, and heart attack are reviewed and when to call the physician/911 reinforced. Tips for preventing the spread of infection discussed.   Advanced Directives:  -Group instruction provided by verbal instruction and written material to support subject matter. Instructor reviews Advanced Directive laws and proper instruction for filling out document.   Pulmonary Video:  -Group video education that reviews the importance of medication and oxygen compliance, exercise, good nutrition, pulmonary hygiene, and pursed lip and diaphragmatic breathing for the pulmonary patient.   Exercise for the Pulmonary Patient:  -Group instruction that is supported by a PowerPoint presentation. Instructor discusses benefits of exercise, core components of exercise, frequency, duration, and intensity of an exercise routine, importance of utilizing pulse oximetry during exercise, safety while exercising, and options of places to exercise outside of rehab.     PULMONARY REHAB OTHER RESPIRATORY from 12/10/2019 in [place]  Date 11/26/19  Educator Handout  Instruction Review Code 1- Verbalizes Understanding      Pulmonary Medications:  -Verbally interactive group education provided by instructor with focus on inhaled medications and proper administration.   Anatomy and Physiology of the Respiratory System and Intimacy:  -Group instruction provided by PowerPoint, verbal discussion, and written material to support subject matter. Instructor reviews respiratory cycle and anatomical components of the respiratory system and their functions. Instructor also reviews differences in obstructive and restrictive respiratory diseases with examples of each. Intimacy, Sex, and Sexuality differences are reviewed with a discussion on how relationships  can change when diagnosed with pulmonary disease. Common sexual concerns are reviewed.   MD DAY -A group question and answer session with a medical doctor that allows participants to ask questions that relate to their pulmonary disease state.   OTHER EDUCATION -Group or individual verbal, written, or video instructions that support the educational goals of the pulmonary rehab program.   PULMONARY REHAB OTHER RESPIRATORY from 12/10/2019 in [place]  Date 12/10/19  Jeannie Fend My Plate]  Educator Ailene Ravel Handout  Instruction Review Code 1- Verbalizes Understanding      Holiday Eating Survival Tips:  -Group instruction provided by PowerPoint slides, verbal discussion, and written materials to support subject matter. The instructor gives patients tips, tricks, and techniques to help them not only survive but enjoy the holidays despite the onslaught of food that accompanies the holidays.   Knowledge Questionnaire Score:  Knowledge Questionnaire Score - 11/06/19 1548      Knowledge Questionnaire Score   Pre Score 16/18           Core Components/Risk Factors/Patient Goals at Admission:  Personal Goals and Risk Factors at Admission - 11/06/19 1401      Core Components/Risk Factors/Patient Goals on Admission   Improve shortness of breath with ADL's Yes    Intervention Provide education, individualized exercise plan and daily activity instruction to help decrease symptoms of SOB with activities of daily living.    Expected Outcomes Short Term: Improve cardiorespiratory fitness to achieve a reduction of symptoms when performing ADLs;Long Term: Be able to perform more ADLs without symptoms or delay the onset of symptoms    Lipids Yes  Intervention Provide education and support for participant on nutrition & aerobic/resistive exercise along with prescribed medications to achieve LDL <71m, HDL >438m    Expected Outcomes Short Term: Participant states  understanding of desired cholesterol values and is compliant with medications prescribed. Participant is following exercise prescription and nutrition guidelines.;Long Term: Cholesterol controlled with medications as prescribed, with individualized exercise RX and with personalized nutrition plan. Value goals: LDL < 7012mHDL > 40 mg.           Core Components/Risk Factors/Patient Goals Review:   Goals and Risk Factor Review    Row Name 11/18/19 1402 12/14/19 1013           Core Components/Risk Factors/Patient Goals Review   Personal Goals Review Develop more efficient breathing techniques such as purse lipped breathing and diaphragmatic breathing and practicing self-pacing with activity.;Improve shortness of breath with ADL's;Increase knowledge of respiratory medications and ability to use respiratory devices properly. Develop more efficient breathing techniques such as purse lipped breathing and diaphragmatic breathing and practicing self-pacing with activity.;Improve shortness of breath with ADL's;Increase knowledge of respiratory medications and ability to use respiratory devices properly.      Review JudFloreine off to a great start and has completed 3 exercise sessions. Working on breathing techniques and pt admits it is hard to "think" about it doesn't feel natural. Advised pt that the more she performs this it will become second natured. Will continue to work with JudCharlett Nose this.  JudZoeya learning to pace herself with exercise, she exercises daily with walking 2 1/2 miles a day.  Presently on the sci fit bike level 2.0 with METS 4.8 and Treadmill 3.0/1.0. JudThera able to maintain her O2 saturation above 88 - this is an improvement as her O2 sat would decrease with any incline. JudLudias enjoyed exercising in pulmonary rehab especially since the covid pandemic.  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  She is  exercising at a high intensity and is very positive.      Expected Outcomes See Admission Goals For JudSherrian continue to increase her workloads while in pulmonary rehab and continue her exercise at home after graduating from the program.             Core Components/Risk Factors/Patient Goals at Discharge (Final Review):   Goals and Risk Factor Review - 12/14/19 1013      Core Components/Risk Factors/Patient Goals Review   Personal Goals Review Develop more efficient breathing techniques such as purse lipped breathing and diaphragmatic breathing and practicing self-pacing with activity.;Improve shortness of breath with ADL's;Increase knowledge of respiratory medications and ability to use respiratory devices properly.    Review JudReneshas enjoyed exercising in pulmonary rehab especially since the covid pandemic.  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  She is exercising at a high intensity and is very positive.    Expected Outcomes For JudThirza continue to increase her workloads while in pulmonary rehab and continue her exercise at home after graduating from the program.           ITP Comments:   Comments: ITP REVIEW Pt is making expected progress toward pulmonary rehab goals after completing 10 sessions. Recommend continued exercise, life style modification, education, and utilization of breathing techniques to increase stamina  and strength and decrease shortness of breath with exertion.

## 2019-12-15 ENCOUNTER — Encounter (HOSPITAL_COMMUNITY)
Admission: RE | Admit: 2019-12-15 | Discharge: 2019-12-15 | Disposition: A | Discharge status: Home / Self Care | Payer: Medicare Other | Source: Ambulatory Visit | Attending: Internal Medicine | Admitting: Internal Medicine

## 2019-12-15 ENCOUNTER — Other Ambulatory Visit: Payer: Self-pay

## 2019-12-15 DIAGNOSIS — J84112 Idiopathic pulmonary fibrosis: Secondary | ICD-10-CM | POA: Diagnosis not present

## 2019-12-15 NOTE — Progress Notes (Signed)
Daily Session Note  Patient Details  Name: Elizabeth Espinoza MRN: [id number] Date of Birth: 05/04/1940 Referring Provider:     Pulmonary Rehab Walk Test from 11/06/2019 in [facility]  Referring Provider Dr. Ramaswamy      Encounter Date: 12/15/2019  Check In:  Session Check In - 12/15/19 1000      Check-In   Supervising physician immediately available to respond to emergencies [facility] immediately available    Physician(s) Dr. Rizwan    Location [facility]    Staff Present Joan Behrens, RN, BSN;Lisa Hughes, RN;Jessica Martin, MS, ACSM-CEP, Exercise Physiologist    Virtual Visit No    Medication changes reported     No    Fall or balance concerns reported    No    Tobacco Cessation No Change    Warm-up and Cool-down Performed as group-led instruction    Resistance Training Performed Yes    VAD Patient? No    PAD/SET Patient? No      Pain Assessment   Currently in Pain? No/denies    Multiple Pain Sites No           Capillary Blood Glucose: No results found for this or any previous visit (from the past 24 hour(s)).    Social History   Tobacco Use  Smoking Status Never Smoker  Smokeless Tobacco Never Used    Goals Met:  Proper associated with RPD/PD & O2 Sat Independence with exercise equipment Exercise tolerated well No report of cardiac concerns or symptoms Strength training completed today  Goals Unmet:  Not Applicable  Comments: Service time is from 0945 to 1055    Dr. Traci Turner is Medical Director for Cardiac Rehab at  [facility]. 

## 2019-12-22 ENCOUNTER — Encounter (HOSPITAL_COMMUNITY)
Admission: RE | Admit: 2019-12-22 | Discharge: 2019-12-22 | Disposition: A | Discharge status: Home / Self Care | Payer: Medicare Other | Source: Ambulatory Visit | Attending: Internal Medicine | Admitting: Internal Medicine

## 2019-12-22 ENCOUNTER — Other Ambulatory Visit: Payer: Self-pay

## 2019-12-22 VITALS — Wt 145.1 lb

## 2019-12-22 DIAGNOSIS — J84112 Idiopathic pulmonary fibrosis: Secondary | ICD-10-CM

## 2019-12-22 NOTE — Progress Notes (Signed)
Reviewed chart and insurance data for medication adherence. Patient does not have any gaps in adherence and is not in danger of failing any Medicare adherence measures. No further action required.

## 2019-12-22 NOTE — Progress Notes (Signed)
Daily Session Note  Patient Details  Name: Elizabeth Espinoza MRN: [id number] Date of Birth: 1940-04-08 Referring Provider:     Pulmonary Rehab Walk Test from 11/06/2019 in [place]  Referring Provider Dr. Chase Caller      Encounter Date: 12/22/2019  Check In:  Session Check In - 12/22/19 1049      Check-In   Supervising physician immediately available to respond to emergencies [facility] immediately available    Physician(s) Dr. Tyrell Antonio    [facility]    Staff Present Rosebud Poles, RN, BSN;Lisa Ysidro Evert, RN;Chemika Nightengale Hassell Done, MS, ACSM-CEP, Exercise Physiologist    Virtual Visit No    Medication changes reported     No    Fall or balance concerns reported    No    Tobacco Cessation No Change    Warm-up and Cool-down Performed as group-led instruction    Resistance Training Performed Yes    VAD Patient? No    PAD/SET Patient? No      Pain Assessment   Currently in Pain? No/denies    Multiple Pain Sites No           Capillary Blood Glucose: No results found for this or any previous visit (from the past 24 hour(s)).   Exercise Prescription Changes - 12/22/19 1100      Response to Exercise   Blood Pressure (Admit) 108/64    Blood Pressure (Exercise) 146/58    Blood Pressure (Exit) 120/70    Heart Rate (Admit) 68 bpm    Heart Rate (Exercise) 119 bpm    Heart Rate (Exit) 75 bpm    Oxygen Saturation (Admit) 98 %    Oxygen Saturation (Exercise) 88 %    Oxygen Saturation (Exit) 96 %    Rating of Perceived Exertion (Exercise) 12    Perceived Dyspnea (Exercise) 2.5    Duration Continue with 45 min of aerobic exercise without signs/symptoms of physical distress.    Intensity THRR unchanged      Progression   Progression Continue to progress workloads to maintain intensity without signs/symptoms of physical distress.    Average METs 6      Resistance Training   Training Prescription Yes    Weight 3lb weights     Reps 10-15    Time 10 Minutes      Treadmill   MPH 3.7    Grade 2    Minutes 15      Bike   Level 3.5    Minutes 15    METs 6           Social History   Tobacco Use  Smoking Status Never Smoker  Smokeless Tobacco Never Used    Goals Met:  Proper associated with RPD/PD & O2 Sat Independence with exercise equipment Exercise tolerated well No report of cardiac concerns or symptoms Strength training completed today  Goals Unmet:  Not Applicable  Comments: Service time is from 0950 to 1100    Dr. Fransico Him is Medical Director for Cardiac Rehab at [facility].

## 2019-12-24 ENCOUNTER — Other Ambulatory Visit: Payer: Self-pay

## 2019-12-24 ENCOUNTER — Encounter (HOSPITAL_COMMUNITY)
Admission: RE | Admit: 2019-12-24 | Discharge: 2019-12-24 | Disposition: A | Discharge status: Home / Self Care | Payer: Medicare Other | Source: Ambulatory Visit | Attending: Internal Medicine | Admitting: Internal Medicine

## 2019-12-24 DIAGNOSIS — J84112 Idiopathic pulmonary fibrosis: Secondary | ICD-10-CM | POA: Diagnosis not present

## 2019-12-24 NOTE — Progress Notes (Signed)
Daily Session Note  Patient Details  Name: Elizabeth Espinoza MRN: [id number] Date of Birth: 17-Oct-1940 Referring Provider:     Pulmonary Rehab Walk Test from 11/06/2019 in [place]  Referring Provider Dr. Chase Caller      Encounter Date: 12/24/2019  Check In:  Session Check In - 12/24/19 0955      Check-In   Supervising physician immediately available to respond to emergencies [facility] immediately available    Physician(s) Dr. Roderic Palau    [facility]    Staff Present Rosebud Poles, RN, BSN;Vashti Bolanos Ysidro Evert, RN;Jessica Hassell Done, MS, ACSM-CEP, Exercise Physiologist    Virtual Visit No    Medication changes reported     No    Fall or balance concerns reported    No    Tobacco Cessation No Change    Warm-up and Cool-down Performed on first and last piece of equipment    Resistance Training Performed Yes    VAD Patient? No    PAD/SET Patient? No      Pain Assessment   Currently in Pain? No/denies    Multiple Pain Sites No           Capillary Blood Glucose: No results found for this or any previous visit (from the past 24 hour(s)).    Social History   Tobacco Use  Smoking Status Never Smoker  Smokeless Tobacco Never Used    Goals Met:  Exercise tolerated well No report of cardiac concerns or symptoms Strength training completed today  Goals Unmet:  Not Applicable  Comments: Service time is from 0948 to 1050    Dr. Fransico Him is Medical Director for Cardiac Rehab at [facility].

## 2019-12-29 ENCOUNTER — Other Ambulatory Visit: Payer: Self-pay

## 2019-12-29 ENCOUNTER — Encounter (HOSPITAL_COMMUNITY)
Admission: RE | Admit: 2019-12-29 | Discharge: 2019-12-29 | Disposition: A | Discharge status: Home / Self Care | Payer: Medicare Other | Source: Ambulatory Visit | Attending: Internal Medicine | Admitting: Internal Medicine

## 2019-12-29 DIAGNOSIS — J84112 Idiopathic pulmonary fibrosis: Secondary | ICD-10-CM | POA: Diagnosis not present

## 2019-12-29 NOTE — Progress Notes (Signed)
Daily Session Note  Patient Details  Name: Elizabeth Espinoza MRN: [id number] Date of Birth: 1940-07-23 Referring Provider:     Pulmonary Rehab Walk Test from 11/06/2019 in [place]  Referring Provider Dr. Chase Caller      Encounter Date: 12/29/2019  Check In:  Session Check In - 12/29/19 1001      Check-In   Supervising physician immediately available to respond to emergencies [facility] immediately available    Physician(s) Dr. Cyndia Skeeters    [facility]    Staff Present Rosebud Poles, RN, BSN;Kaprice Kage Ysidro Evert, RN;Jessica Hassell Done, MS, ACSM-CEP, Exercise Physiologist    Virtual Visit No    Medication changes reported     No    Fall or balance concerns reported    No    Tobacco Cessation No Change    Warm-up and Cool-down Performed on first and last piece of equipment    Resistance Training Performed Yes    VAD Patient? No    PAD/SET Patient? No      Pain Assessment   Currently in Pain? No/denies    Multiple Pain Sites No           Capillary Blood Glucose: No results found for this or any previous visit (from the past 24 hour(s)).    Social History   Tobacco Use  Smoking Status Never Smoker  Smokeless Tobacco Never Used    Goals Met:  Exercise tolerated well No report of cardiac concerns or symptoms Strength training completed today  Goals Unmet:  Not Applicable  Comments: Service time is from 0950 to 1100   Dr. Fransico Him is Medical Director for Cardiac Rehab at [facility].

## 2019-12-31 ENCOUNTER — Encounter (HOSPITAL_COMMUNITY)
Admission: RE | Admit: 2019-12-31 | Discharge: 2019-12-31 | Disposition: A | Discharge status: Home / Self Care | Payer: Medicare Other | Source: Ambulatory Visit | Attending: Internal Medicine | Admitting: Internal Medicine

## 2019-12-31 ENCOUNTER — Other Ambulatory Visit: Payer: Self-pay

## 2019-12-31 DIAGNOSIS — J84112 Idiopathic pulmonary fibrosis: Secondary | ICD-10-CM

## 2019-12-31 NOTE — Progress Notes (Signed)
Daily Session Note  Patient Details  Name: Elizabeth Espinoza MRN: [id number] Date of Birth: [birth year] Referring Provider:   April Manson Pulmonary Rehab Walk Test from 11/06/2019 in [place]  Referring Provider Dr. Chase Caller      Encounter Date: 12/31/2019  Check In:  Session Check In - 12/31/19 1003      Check-In   Supervising physician immediately available to respond to emergencies [facility] immediately available    Physician(s) Dr. Cyndia Skeeters    [facility]    Staff Present Rosebud Poles, RN, BSN;Yazid Pop Ysidro Evert, RN;Jessica Hassell Done, MS, ACSM-CEP, Exercise Physiologist    Virtual Visit No    Medication changes reported     No    Fall or balance concerns reported    No    Tobacco Cessation No Change    Warm-up and Cool-down Performed on first and last piece of equipment    Resistance Training Performed Yes    VAD Patient? No    PAD/SET Patient? No      Pain Assessment   Currently in Pain? No/denies    Multiple Pain Sites No           Capillary Blood Glucose: No results found for this or any previous visit (from the past 24 hour(s)).    Social History   Tobacco Use  Smoking Status Never Smoker  Smokeless Tobacco Never Used    Goals Met:  Exercise tolerated well No report of cardiac concerns or symptoms Strength training completed today  Goals Unmet:  Not Applicable  Comments: Service time is from 0945 to 1110    Dr. Fransico Him is Medical Director for Cardiac Rehab at [facility].

## 2020-01-05 ENCOUNTER — Encounter (HOSPITAL_COMMUNITY)
Admission: RE | Admit: 2020-01-05 | Discharge: 2020-01-05 | Disposition: A | Discharge status: Home / Self Care | Payer: Medicare Other | Source: Ambulatory Visit | Attending: Internal Medicine | Admitting: Internal Medicine

## 2020-01-05 ENCOUNTER — Other Ambulatory Visit: Payer: Self-pay

## 2020-01-05 VITALS — Wt 143.3 lb

## 2020-01-05 DIAGNOSIS — J84112 Idiopathic pulmonary fibrosis: Secondary | ICD-10-CM | POA: Diagnosis not present

## 2020-01-05 NOTE — Progress Notes (Signed)
Pulmonary Individual Treatment Plan  Patient Details  Name: Elizabeth Espinoza MRN: [id number] Date of Birth: December 27, 1940 Referring Provider:   April Manson Pulmonary Rehab Walk Test from 11/06/2019 in [place]  Referring Provider Dr. Chase Caller      Initial Encounter Date:  Flowsheet Row Pulmonary Rehab Walk Test from 11/06/2019 in [place]  Date 11/06/19      Visit Diagnosis: IPF (idiopathic pulmonary fibrosis) ([facility])  Patient's Home Medications on Admission:   Current Outpatient Medications:    aspirin EC 81 MG tablet, Take 1 tablet (81 mg total) by mouth daily., Disp: 90 tablet, Rfl: 3   betamethasone, augmented, (DIPROLENE) 0.05 % lotion, Apply 1 application topically 2 (two) times daily., Disp: , Rfl:    Biotin 5000 MCG TABS, Take 5,000 mcg by mouth daily., Disp: , Rfl:    camphor-menthol (SARNA) lotion, Apply 1 application topically as needed for itching. , Disp: , Rfl:    Carboxymethylcellul-Glycerin (REFRESH RELIEVA OP), Place 1 drop into both eyes every 6 (six) hours as needed (dry eyes)., Disp: , Rfl:    cetirizine (ZYRTEC) 10 MG tablet, Take 10 mg by mouth daily. , Disp: , Rfl:    cholecalciferol (VITAMIN D3) 25 MCG (1000 UT) tablet, Take 1,000 Units by mouth daily., Disp: , Rfl:    clobetasol (TEMOVATE) 0.05 % external solution, SMARTSIG:1 Milliliter(s) Topical Twice Daily, Disp: , Rfl:    esomeprazole (NEXIUM) 40 MG capsule, Take 40 mg by mouth every morning., Disp: , Rfl:    famotidine (PEPCID) 40 MG tablet, Take 1 tablet (40 mg total) by mouth daily., Disp: 90 tablet, Rfl: 1   fluticasone (FLONASE) 50 MCG/ACT nasal spray, Place 1 spray into both nostrils daily as needed for allergies or rhinitis., Disp: , Rfl:    hydroxychloroquine (PLAQUENIL) 200 MG tablet, Take 200 mg by mouth 2 (two) times daily., Disp: , Rfl:    hydrOXYzine (ATARAX/VISTARIL) 25 MG tablet, Take 25 mg by mouth 3 (three)  times daily. , Disp: , Rfl:    levothyroxine (SYNTHROID) 100 MCG tablet, Take 1 tablet (100 mcg total) by mouth daily., Disp: 90 tablet, Rfl: 3   magnesium oxide (MAG-OX) 400 MG tablet, Take 400 mg by mouth daily., Disp: , Rfl:    Multiple Vitamins-Minerals (ZINC PO), Take 1 tablet by mouth., Disp: , Rfl:    pravastatin (PRAVACHOL) 40 MG tablet, TAKE 1 TABLET BY MOUTH  DAILY, Disp: 90 tablet, Rfl: 3   rOPINIRole (REQUIP) 0.5 MG tablet, TAKE 2 TABLETS BY MOUTH  AFTER DINNER EACH NIGHT., Disp: 180 tablet, Rfl: 3   Saccharomyces boulardii (FLORASTOR PO), Take 1 capsule by mouth daily., Disp: , Rfl:    vitamin B-12 (CYANOCOBALAMIN) 500 MCG tablet, Take 500 mcg by mouth daily., Disp: , Rfl:   Past Medical History: Past Medical History:  Diagnosis Date   Allergic rhinitis    GERD (gastroesophageal reflux disease)    Glaucoma    SUSPECT   Hiatal hernia    Hyperlipidemia    LDL goal = < 100   Hypothyroidism    ILD (interstitial lung disease) (HCC)    Lichen planopilaris    MVP (mitral valve prolapse)    documented on 2 D ECHO   Restless legs    Urticaria     Tobacco Use: Social History   Tobacco Use  Smoking Status Never Smoker  Smokeless Tobacco Never Used    Labs: Recent Review [employer]  for [facility]P Cardiac and Pulmonary Rehab Latest Ref Rng & Units 06/13/2015 06/27/2016 07/02/2017 07/04/2018 07/06/2019   Cholestrol 0 - 200 mg/dL - 160 156 168 168   LDLCALC 0 - 99 mg/dL - 84 80 88 85   HDL >39.00 mg/dL - 54.60 43.10 52.30 49.40   Trlycerides 0.0 - 149.0 mg/dL - 109.0 164.0(H) 136.0 165.0(H)   Hemoglobin A1c 4.6 - 6.5 % 6.1 6.0 6.0 6.2 6.1      Capillary Blood Glucose: No results found for: GLUCAP   Pulmonary Assessment Scores:  Pulmonary Assessment Scores    Row Name 11/06/19 1549         ADL UCSD   ADL Phase Entry     SOB Score total 51           CAT Score   CAT Score 20           mMRC Score   mMRC Score 2            UCSD: Self-administered rating of dyspnea associated with activities of daily living (ADLs) 6-point scale (0 = "not at all" to 5 = "maximal or unable to do because of breathlessness")  Scoring Scores range from 0 to 120.  Minimally important difference is 5 units  CAT: CAT can identify the health impairment of COPD patients and is better correlated with disease progression.  CAT has a scoring range of zero to 40. The CAT score is classified into four groups of low (less than 10), medium (10 - 20), high (21-30) and very high (31-40) based on the impact level of disease on health status. A CAT score over 10 suggests significant symptoms.  A worsening CAT score could be explained by an exacerbation, poor medication adherence, poor inhaler technique, or progression of COPD or comorbid conditions.  CAT MCID is 2 points  mMRC: mMRC (Modified Medical Research Council) Dyspnea Scale is used to assess the degree of baseline functional disability in patients of respiratory disease due to dyspnea. No minimal important difference is established. A decrease in score of 1 point or greater is considered a positive change.   Pulmonary Function Assessment:   Exercise Target Goals: Exercise Program Goal: Individual exercise prescription set using results from initial 6 min walk test and THRR while considering  patients activity barriers and safety.   Exercise Prescription Goal: Initial exercise prescription builds to 30-45 minutes a day of aerobic activity, 2-3 days per week.  Home exercise guidelines will be given to patient during program as part of exercise prescription that the participant will acknowledge.  Activity Barriers & Risk Stratification:  Activity Barriers & Cardiac Risk Stratification - 11/06/19 1350      Activity Barriers & Cardiac Risk Stratification   Cardiac Risk Stratification Moderate           6 Minute Walk:  6 Minute Walk    Row Name 11/06/19 1552         6 Minute  Walk   Phase Initial     Distance 1412 feet     Walk Time 6 minutes     # of Rest Breaks 2  Had to stop her twice because her oxygen saturation dropped below 88%     MPH 2.67     METS 3.21     RPE 11     Perceived Dyspnea  1     VO2 Peak 11.24     Symptoms Yes (comment)  Oxygen saturation dropped to 83%  Resting HR 80 bpm     Resting BP 124/70     Resting Oxygen Saturation  99 %     Exercise Oxygen Saturation  during 6 min walk 83 %     Max Ex. HR 117 bpm     Max Ex. BP 154/70     2 Minute Post BP 116/68           Interval HR   1 Minute HR 78     2 Minute HR 105     3 Minute HR 114     4 Minute HR 114     5 Minute HR 117     6 Minute HR 116     2 Minute Post HR 72     Interval Heart Rate? Yes           Interval Oxygen   Interval Oxygen? Yes     Baseline Oxygen Saturation % 99 %     1 Minute Oxygen Saturation % 84 %     1 Minute Liters of Oxygen 0 L     2 Minute Oxygen Saturation % 83 %     2 Minute Liters of Oxygen 0 L     3 Minute Oxygen Saturation % 85 %     3 Minute Liters of Oxygen 0 L     4 Minute Oxygen Saturation % 88 %     4 Minute Liters of Oxygen 0 L     5 Minute Oxygen Saturation % 91 %     5 Minute Liters of Oxygen 0 L     6 Minute Oxygen Saturation % 87 %     6 Minute Liters of Oxygen 0 L     2 Minute Post Oxygen Saturation % 100 %     2 Minute Post Liters of Oxygen 0 L            Oxygen Initial Assessment:  Oxygen Initial Assessment - 11/06/19 1627      Home Oxygen   Home Oxygen Device None    Sleep Oxygen Prescription None    Home Exercise Oxygen Prescription None    Home Resting Oxygen Prescription None      Initial 6 min Walk   Oxygen Used None      Program Oxygen Prescription   Program Oxygen Prescription None      Intervention   Short Term Goals To learn and exhibit compliance with exercise, home and travel O2 prescription;To learn and understand importance of monitoring SPO2 with pulse oximeter and demonstrate accurate use of  the pulse oximeter.;To learn and understand importance of maintaining oxygen saturations>88%;To learn and demonstrate proper pursed lip breathing techniques or other breathing techniques.;To learn and demonstrate proper use of respiratory medications    Long  Term Goals Compliance with respiratory medication;Exhibits proper breathing techniques, such as pursed lip breathing or other method taught during program session;Maintenance of O2 saturations>88%;Verbalizes importance of monitoring SPO2 with pulse oximeter and return demonstration;Exhibits compliance with exercise, home and travel O2 prescription           Oxygen Re-Evaluation:  Oxygen Re-Evaluation    Row Name 11/19/19 7619 12/10/19 0845 01/04/20 1623         Program Oxygen Prescription   Program Oxygen Prescription None None None           Home Oxygen   Home Oxygen Device None None None     Sleep Oxygen Prescription None None None  Home Exercise Oxygen Prescription None None None     Home Resting Oxygen Prescription None None None           Goals/Expected Outcomes   Short Term Goals To learn and exhibit compliance with exercise, home and travel O2 prescription;To learn and understand importance of monitoring SPO2 with pulse oximeter and demonstrate accurate use of the pulse oximeter.;To learn and understand importance of maintaining oxygen saturations>88%;To learn and demonstrate proper pursed lip breathing techniques or other breathing techniques.;To learn and demonstrate proper use of respiratory medications To learn and exhibit compliance with exercise, home and travel O2 prescription;To learn and understand importance of monitoring SPO2 with pulse oximeter and demonstrate accurate use of the pulse oximeter.;To learn and understand importance of maintaining oxygen saturations>88%;To learn and demonstrate proper pursed lip breathing techniques or other breathing techniques.;To learn and demonstrate proper use of respiratory  medications To learn and exhibit compliance with exercise, home and travel O2 prescription;To learn and understand importance of monitoring SPO2 with pulse oximeter and demonstrate accurate use of the pulse oximeter.;To learn and understand importance of maintaining oxygen saturations>88%;To learn and demonstrate proper pursed lip breathing techniques or other breathing techniques.;To learn and demonstrate proper use of respiratory medications     Long  Term Goals Compliance with respiratory medication;Exhibits proper breathing techniques, such as pursed lip breathing or other method taught during program session;Maintenance of O2 saturations>88%;Verbalizes importance of monitoring SPO2 with pulse oximeter and return demonstration;Exhibits compliance with exercise, home and travel O2 prescription Compliance with respiratory medication;Exhibits proper breathing techniques, such as pursed lip breathing or other method taught during program session;Maintenance of O2 saturations>88%;Verbalizes importance of monitoring SPO2 with pulse oximeter and return demonstration;Exhibits compliance with exercise, home and travel O2 prescription Compliance with respiratory medication;Exhibits proper breathing techniques, such as pursed lip breathing or other method taught during program session;Maintenance of O2 saturations>88%;Verbalizes importance of monitoring SPO2 with pulse oximeter and return demonstration;Exhibits compliance with exercise, home and travel O2 prescription     Comments -- -- Pt is really good about checking her oxygen saturation while exercising and also perfroms pursed lip breathing without cues.     Goals/Expected Outcomes Compliance and understanding of oxygen saturation and pursed lip breathing Compliance and understanding of oxygen saturation and pursed lip breathing Compliance and understanding of oxygen saturation and pursed lip breathing            Oxygen Discharge (Final Oxygen  Re-Evaluation):  Oxygen Re-Evaluation - 01/04/20 1623      Program Oxygen Prescription   Program Oxygen Prescription None      Home Oxygen   Home Oxygen Device None    Sleep Oxygen Prescription None    Home Exercise Oxygen Prescription None    Home Resting Oxygen Prescription None      Goals/Expected Outcomes   Short Term Goals To learn and exhibit compliance with exercise, home and travel O2 prescription;To learn and understand importance of monitoring SPO2 with pulse oximeter and demonstrate accurate use of the pulse oximeter.;To learn and understand importance of maintaining oxygen saturations>88%;To learn and demonstrate proper pursed lip breathing techniques or other breathing techniques.;To learn and demonstrate proper use of respiratory medications    Long  Term Goals Compliance with respiratory medication;Exhibits proper breathing techniques, such as pursed lip breathing or other method taught during program session;Maintenance of O2 saturations>88%;Verbalizes importance of monitoring SPO2 with pulse oximeter and return demonstration;Exhibits compliance with exercise, home and travel O2 prescription    Comments Pt is really good about checking  her oxygen saturation while exercising and also perfroms pursed lip breathing without cues.    Goals/Expected Outcomes Compliance and understanding of oxygen saturation and pursed lip breathing           Initial Exercise Prescription:  Initial Exercise Prescription - 11/06/19 1600      Date of Initial Exercise RX and Referring Provider   Date 11/06/19    Referring Provider Dr. Chase Caller    Expected Discharge Date 01/07/20      Treadmill   Minutes 15      Bike   Level 2    Minutes 15      Prescription Details   Frequency (times per week) 2    Duration Progress to 30 minutes of continuous aerobic without signs/symptoms of physical distress      Intensity   THRR 40-80% of Max Heartrate 56-113    Ratings of Perceived Exertion  11-13    Perceived Dyspnea 0-4      Progression   Progression Continue to progress workloads to maintain intensity without signs/symptoms of physical distress.      Resistance Training   Training Prescription Yes    Weight orange bands    Reps 10-15           Perform Capillary Blood Glucose checks as needed.  Exercise Prescription Changes:  Exercise Prescription Changes    Row Name 11/19/19 0700 11/24/19 1100 12/08/19 1100 12/10/19 1100 12/22/19 1100     Response to Exercise   Blood Pressure (Admit) 138/76 116/70 114/64 -- 108/64   Blood Pressure (Exercise) 142/70 120/50 170/82 -- 146/58   Blood Pressure (Exit) 120/64 106/60 112/62 -- 120/70   Heart Rate (Admit) 73 bpm 79 bpm 67 bpm -- 68 bpm   Heart Rate (Exercise) 111 bpm 118 bpm 125 bpm -- 119 bpm   Heart Rate (Exit) 66 bpm 77 bpm 78 bpm -- 75 bpm   Oxygen Saturation (Admit) 99 % 98 % 99 % -- 98 %   Oxygen Saturation (Exercise) 91 % 93 % 88 % -- 88 %   Oxygen Saturation (Exit) 97 % 98 % 98 % -- 96 %   Rating of Perceived Exertion (Exercise) _0 -- 12   Perceived Dyspnea (Exercise) 2 1.5 2 -- 2.5   Duration Continue with 30 min of aerobic exercise without signs/symptoms of physical distress. Continue with 45 min of aerobic exercise without signs/symptoms of physical distress. Continue with 30 min of aerobic exercise without signs/symptoms of physical distress. -- Continue with 45 min of aerobic exercise without signs/symptoms of physical distress.   Intensity Other (comment)  40%-80% HR Max THRR unchanged THRR unchanged -- THRR unchanged     Progression   Progression Continue to progress workloads to maintain intensity without signs/symptoms of physical distress. Continue to progress workloads to maintain intensity without signs/symptoms of physical distress. Continue to progress workloads to maintain intensity without signs/symptoms of physical distress. -- Continue to progress workloads to maintain intensity without  signs/symptoms of physical distress.   Average METs 4.4 -- 6 -- 6     Resistance Training   Training Prescription Yes No Yes -- Yes   Weight orange bands orange bands orange bands -- 3lb weights   Reps 10-15 10-15 10-15 -- 10-15   Time 10 Minutes 10 Minutes 10 Minutes -- 10 Minutes     Treadmill   MPH 3 3 3.6 -- 3.7   Grade _1 -- 2   Minutes _2 --  15     Bike   Level 2 2.5 3 -- 3.5   Minutes _0 -- 15   METs 4.8 5.8 6 -- 6     Home Exercise Plan   Plans to continue exercise at -- -- -- Home (comment)  Walking, Recumbent Bike, and Hand weights --   Frequency -- -- -- Add 3 additional days to program exercise sessions. --   Initial Home Exercises Provided -- -- -- 12/10/19 --          Exercise Comments:  Exercise Comments    Row Name 11/10/19 1213 12/10/19 1146         Exercise Comments Pt completed first day of exercise and tolerated exercise well. She had no complaints or concerns. She is a very active person and is able to do the upright bike and treadmill. We will continue to follow and progress her as she is able. Completed home exercise with pt. Pt is very active at home. Gave her ways to make progressions with her current home exercise. Pt was receptive.             Exercise Goals and Review:  Exercise Goals    Row Name 11/06/19 1551             Exercise Goals   Increase Physical Activity Yes       Intervention Provide advice, education, support and counseling about physical activity/exercise needs.;Develop an individualized exercise prescription for aerobic and resistive training based on initial evaluation findings, risk stratification, comorbidities and participant's personal goals.       Expected Outcomes Short Term: Attend rehab on a regular basis to increase amount of physical activity.;Long Term: Add in home exercise to make exercise part of routine and to increase amount of physical activity.;Long Term: Exercising regularly at least 3-5  days a week.       Increase Strength and Stamina Yes       Intervention Provide advice, education, support and counseling about physical activity/exercise needs.;Develop an individualized exercise prescription for aerobic and resistive training based on initial evaluation findings, risk stratification, comorbidities and participant's personal goals.       Expected Outcomes Short Term: Increase workloads from initial exercise prescription for resistance, speed, and METs.;Short Term: Perform resistance training exercises routinely during rehab and add in resistance training at home;Long Term: Improve cardiorespiratory fitness, muscular endurance and strength as measured by increased METs and functional capacity (6MWT)       Able to understand and use rate of perceived exertion (RPE) scale Yes       Intervention Provide education and explanation on how to use RPE scale       Expected Outcomes Short Term: Able to use RPE daily in rehab to express subjective intensity level;Long Term:  Able to use RPE to guide intensity level when exercising independently       Able to understand and use Dyspnea scale Yes       Intervention Provide education and explanation on how to use Dyspnea scale       Expected Outcomes Short Term: Able to use Dyspnea scale daily in rehab to express subjective sense of shortness of breath during exertion;Long Term: Able to use Dyspnea scale to guide intensity level when exercising independently       Knowledge and understanding of Target Heart Rate Range (THRR) Yes       Intervention Provide education and explanation of THRR including how the numbers were predicted and where  they are located for reference       Expected Outcomes Short Term: Able to state/look up THRR;Long Term: Able to use THRR to govern intensity when exercising independently;Short Term: Able to use daily as guideline for intensity in rehab       Understanding of Exercise Prescription Yes       Intervention Provide  education, explanation, and written materials on patient's individual exercise prescription       Expected Outcomes Short Term: Able to explain program exercise prescription;Long Term: Able to explain home exercise prescription to exercise independently              Exercise Goals Re-Evaluation :  Exercise Goals Re-Evaluation    Row Name 11/19/19 0710 12/10/19 0841 01/04/20 1619         Exercise Goal Re-Evaluation   Exercise Goals Review Increase Physical Activity;Increase Strength and Stamina;Able to understand and use rate of perceived exertion (RPE) scale;Able to understand and use Dyspnea scale;Knowledge and understanding of Target Heart Rate Range (THRR);Understanding of Exercise Prescription Increase Physical Activity;Increase Strength and Stamina;Able to understand and use rate of perceived exertion (RPE) scale;Able to understand and use Dyspnea scale;Knowledge and understanding of Target Heart Rate Range (THRR);Understanding of Exercise Prescription Increase Physical Activity;Increase Strength and Stamina;Able to understand and use rate of perceived exertion (RPE) scale;Able to understand and use Dyspnea scale;Knowledge and understanding of Target Heart Rate Range (THRR);Understanding of Exercise Prescription     Comments Pt has completed 3 exercise sessions and has tolerated very well. Pt is very motivated and pushes herself to make progressions. She is currently exercisisng at 4.8 METS on the Bike. Will continue to monitor and progress as able. Pt has completed 9 exercise sessions and has been making very good progression with workloads and METS. She is very active and very motivated to exercise. She is exercising at 6.0 METS on the bike and 6.7 METS on the treadmill. Will continue to monitor and progress as able. Elizabeth Espinoza has completed 15 exercise sessions and has made excellent progression. She works very hard during rehab and can tell she is making improvements. She only has a few more  sessions before she graduates from the program. She is very high functioning and is exercising at 6.2 METS on the bike. Will continue to monitor and progress as she is able. She has a plan to keep exercising after she graduates from the program.     Expected Outcomes Through exercise at rehab and home the patient will decrease shortness of breath with daily activities and feel confident in carrying out an exercise regimn at home. Through exercise at rehab and home the patient will decrease shortness of breath with daily activities and feel confident in carrying out an exercise regimn at home. Through exercise at rehab and home the patient will decrease shortness of breath with daily activities and feel confident in carrying out an exercise regimn at home.            Discharge Exercise Prescription (Final Exercise Prescription Changes):  Exercise Prescription Changes - 12/22/19 1100      Response to Exercise   Blood Pressure (Admit) 108/64    Blood Pressure (Exercise) 146/58    Blood Pressure (Exit) 120/70    Heart Rate (Admit) 68 bpm    Heart Rate (Exercise) 119 bpm    Heart Rate (Exit) 75 bpm    Oxygen Saturation (Admit) 98 %    Oxygen Saturation (Exercise) 88 %    Oxygen Saturation (Exit) 96 %  Rating of Perceived Exertion (Exercise) 12    Perceived Dyspnea (Exercise) 2.5    Duration Continue with 45 min of aerobic exercise without signs/symptoms of physical distress.    Intensity THRR unchanged      Progression   Progression Continue to progress workloads to maintain intensity without signs/symptoms of physical distress.    Average METs 6      Resistance Training   Training Prescription Yes    Weight 3lb weights    Reps 10-15    Time 10 Minutes      Treadmill   MPH 3.7    Grade 2    Minutes 15      Bike   Level 3.5    Minutes 15    METs 6           Nutrition:  Target Goals: Understanding of nutrition guidelines, daily intake of sodium <1590m, cholesterol  <2074m calories 30% from fat and 7% or less from saturated fats, daily to have 5 or more servings of fruits and vegetables.  Biometrics:  Pre Biometrics - 11/06/19 1603      Pre Biometrics   Grip Strength 34 kg            Nutrition Therapy Plan and Nutrition Goals:  Nutrition Therapy & Goals - 11/17/19 1335      Nutrition Therapy   Diet General healthful      Personal Nutrition Goals   Nutrition Goal Pt to continue to build a healthy plate including vegetables, fruits, whole grains, and low-fat dairy products in a generally healthy meal plan.      Intervention Plan   Intervention Prescribe, educate and counsel regarding individualized specific dietary modifications aiming towards targeted core components such as weight, hypertension, lipid management, diabetes, heart failure and other comorbidities.    Expected Outcomes Short Term Goal: Understand basic principles of dietary content, such as calories, fat, sodium, cholesterol and nutrients.           Nutrition Assessments:  Nutrition Assessments - 11/17/19 1336      Rate Your Plate Scores   Pre Score 60          MEDIFICTS Score Key:  ?70 Need to make dietary changes   40-70 Heart Healthy Diet  ? 40 Therapeutic Level Cholesterol Diet   Picture Your Plate Scores:  <4<76nhealthy dietary pattern with much room for improvement.  41-50 Dietary pattern unlikely to meet recommendations for good health and room for improvement.  51-60 More healthful dietary pattern, with some room for improvement.   >60 Healthy dietary pattern, although there may be some specific behaviors that could be improved.    Nutrition Goals Re-Evaluation:  Nutrition Goals Re-Evaluation    [place] 11/17/19 1336 12/10/19 0738 01/04/20 1324         Goals   Current Weight 149 lb (67.6 kg) 146 lb 13.2 oz (66.6 kg) 144 lb 10 oz (65.6 kg)     Nutrition Goal Pt to continue to build a healthy plate including vegetables, fruits, whole grains,  and low-fat dairy products in a generally healthy meal plan. Pt to continue to build a healthy plate including vegetables, fruits, whole grains, and low-fat dairy products in a generally healthy meal plan. Pt to continue to build a healthy plate including vegetables, fruits, whole grains, and low-fat dairy products in a generally healthy meal plan.     Comment -- Will reassess diet with post Rate My Plate survey as pt follows a healthy diet  Will reassess diet with post Rate My Plate survey as pt follows a healthy diet            Nutrition Goals Discharge (Final Nutrition Goals Re-Evaluation):  Nutrition Goals Re-Evaluation - 01/04/20 1324      Goals   Current Weight 144 lb 10 oz (65.6 kg)    Nutrition Goal Pt to continue to build a healthy plate including vegetables, fruits, whole grains, and low-fat dairy products in a generally healthy meal plan.    Comment Will reassess diet with post Rate My Plate survey as pt follows a healthy diet           Psychosocial: Target Goals: Acknowledge presence or absence of significant depression and/or stress, maximize coping skills, provide positive support system. Participant is able to verbalize types and ability to use techniques and skills needed for reducing stress and depression.  Initial Review & Psychosocial Screening:  Initial Psych Review & Screening - 11/06/19 1353      Initial Review   Current issues with None Identified;Current Sleep Concerns      Family Dynamics   Good Support System? Yes    Comments supportive husband of 69 + years      Barriers   Psychosocial barriers to participate in program The patient should benefit from training in stress management and relaxation.      Screening Interventions   Interventions Encouraged to exercise           Quality of Life Scores:  Scores of 19 and below usually indicate a poorer quality of life in these areas.  A difference of  2-3 points is a clinically meaningful difference.  A  difference of 2-3 points in the total score of the Quality of Life Index has been associated with significant improvement in overall quality of life, self-image, physical symptoms, and general health in studies assessing change in quality of life.  PHQ-9: Recent Review Flowsheet Data    Depression screen [facility] 2/9 11/06/2019 11/06/2019 07/09/2019 07/04/2018 07/03/2018   Decreased Interest 0 0 0 0 0   Down, Depressed, Hopeless 0 0 0 0 0   PHQ - 2 Score 0 0 0 0 0   Altered sleeping 3 - - - -   Tired, decreased energy 3 - - - -   Change in appetite 0 - - - -   Feeling bad or failure about yourself  0 - - - -   Trouble concentrating 0 - - - -   Moving slowly or fidgety/restless 0 - - - -   Suicidal thoughts 0 - - - -   PHQ-9 Score 6 - - - -   Difficult doing work/chores Not difficult at all - - - -     Interpretation of Total Score  Total Score Depression Severity:  1-4 = Minimal depression, 5-9 = Mild depression, 10-14 = Moderate depression, 15-19 = Moderately severe depression, 20-27 = Severe depression   Psychosocial Evaluation and Intervention:  Psychosocial Evaluation - 11/18/19 1357      Psychosocial Evaluation & Interventions   Expected Outcomes Elizabeth Espinoza will report less issues with her sleep with less awakening early am and the inability to go back to sleep.           Psychosocial Re-Evaluation:  Psychosocial Re-Evaluation    Row Name 11/18/19 1356 11/18/19 1357 12/14/19 1009 12/29/19 0901       Psychosocial Re-Evaluation   Current issues with Current Sleep Concerns -- Current Sleep Concerns Current Sleep  Concerns    Comments Elizabeth Espinoza reports that she awakens easily and has difficutly getting back to sleep. -- No psychosocial concerns identified at this time, her sleep concerns are not linked to depression, she occassionally awakens from sleep and has trouble going back to sleep.  The sleep concerns are unchanged while participating in pulmonary rehab. Elizabeth Espinoza continues to have  sleep concerns, able to sleep 4 hours and then awakens.  She is trying to take melatonin to see if that helps, she does not feel depressed.    Expected Outcomes -- Elizabeth Espinoza will report less issues with her sleep with less awakening early am and the inability to go back to sleep. For Elizabeth Espinoza to continue to have no psychosocial concerns while participating in pulmonary rehab. --    Interventions Encouraged to attend Pulmonary Rehabilitation for the exercise -- Encouraged to attend Pulmonary Rehabilitation for the exercise;Relaxation education Encouraged to attend Pulmonary Rehabilitation for the exercise;Relaxation education  given relaxation handout.    Continue Psychosocial Services  Follow up required by staff -- No Follow up required No Follow up required           Psychosocial Discharge (Final Psychosocial Re-Evaluation):  Psychosocial Re-Evaluation - 12/29/19 0901      Psychosocial Re-Evaluation   Current issues with Current Sleep Concerns    Comments Elizabeth Espinoza continues to have sleep concerns, able to sleep 4 hours and then awakens.  She is trying to take melatonin to see if that helps, she does not feel depressed.    Interventions Encouraged to attend Pulmonary Rehabilitation for the exercise;Relaxation education   given relaxation handout.   Continue Psychosocial Services  No Follow up required           Education: Education Goals: Education classes will be provided on a weekly basis, covering required topics. Participant will state understanding/return demonstration of topics presented.  Learning Barriers/Preferences:  Learning Barriers/Preferences - 11/06/19 1400      Learning Barriers/Preferences   Learning Barriers Hearing    Learning Preferences Verbal Instruction;Individual Instruction;Written Material           Education Topics: Risk Factor Reduction:  -Group instruction that is supported by a PowerPoint presentation. Instructor discusses the definition of a risk factor,  different risk factors for pulmonary disease, and how the heart and lungs work together.   Flowsheet Row PULMONARY REHAB OTHER RESPIRATORY from 12/31/2019 in [place]  Educator --  [Mets]      Nutrition for Pulmonary Patient:  -Group instruction provided by PowerPoint slides, verbal discussion, and written materials to support subject matter. The instructor gives an explanation and review of healthy diet recommendations, which includes a discussion on weight management, recommendations for fruit and vegetable consumption, as well as protein, fluid, caffeine, fiber, sodium, sugar, and alcohol. Tips for eating when patients are short of breath are discussed.   Pursed Lip Breathing:  -Group instruction that is supported by demonstration and informational handouts. Instructor discusses the benefits of pursed lip and diaphragmatic breathing and detailed demonstration on how to preform both.     Oxygen Safety:  -Group instruction provided by PowerPoint, verbal discussion, and written material to support subject matter. There is an overview of What is Oxygen and Why do we need it.  Instructor also reviews how to create a safe environment for oxygen use, the importance of using oxygen as prescribed, and the risks of noncompliance. There is a brief discussion on traveling with oxygen and resources the patient may utilize.  Oxygen Equipment:  -Group instruction provided by [facility] Staff utilizing handouts, written materials, and equipment demonstrations.   Signs and Symptoms:  -Group instruction provided by written material and verbal discussion to support subject matter. Warning signs and symptoms of infection, stroke, and heart attack are reviewed and when to call the physician/911 reinforced. Tips for preventing the spread of infection discussed.   Advanced Directives:  -Group instruction provided by verbal instruction and written material to support  subject matter. Instructor reviews Advanced Directive laws and proper instruction for filling out document.   Pulmonary Video:  -Group video education that reviews the importance of medication and oxygen compliance, exercise, good nutrition, pulmonary hygiene, and pursed lip and diaphragmatic breathing for the pulmonary patient.   Exercise for the Pulmonary Patient:  -Group instruction that is supported by a PowerPoint presentation. Instructor discusses benefits of exercise, core components of exercise, frequency, duration, and intensity of an exercise routine, importance of utilizing pulse oximetry during exercise, safety while exercising, and options of places to exercise outside of rehab.   Flowsheet Row PULMONARY REHAB OTHER RESPIRATORY from 12/31/2019 in [place]  Date 11/26/19  Educator Handout  Instruction Review Code 1- Verbalizes Understanding      Pulmonary Medications:  -Verbally interactive group education provided by instructor with focus on inhaled medications and proper administration. Flowsheet Row PULMONARY REHAB OTHER RESPIRATORY from 12/31/2019 in [place]  Date 12/31/19  Educator Handout      Anatomy and Physiology of the Respiratory System and Intimacy:  -Group instruction provided by PowerPoint, verbal discussion, and written material to support subject matter. Instructor reviews respiratory cycle and anatomical components of the respiratory system and their functions. Instructor also reviews differences in obstructive and restrictive respiratory diseases with examples of each. Intimacy, Sex, and Sexuality differences are reviewed with a discussion on how relationships can change when diagnosed with pulmonary disease. Common sexual concerns are reviewed.   MD DAY -A group question and answer session with a medical doctor that allows participants to ask questions that relate to their pulmonary disease  state.   OTHER EDUCATION -Group or individual verbal, written, or video instructions that support the educational goals of the pulmonary rehab program. [place] from 12/31/2019 in [place]  Date 12/10/19  Jeannie Fend My Plate]  Educator Ailene Ravel Handout  Instruction Review Code 1- Verbalizes Understanding      Holiday Eating Survival Tips:  -Group instruction provided by PowerPoint slides, verbal discussion, and written materials to support subject matter. The instructor gives patients tips, tricks, and techniques to help them not only survive but enjoy the holidays despite the onslaught of food that accompanies the holidays.   Knowledge Questionnaire Score:  Knowledge Questionnaire Score - 11/06/19 1548      Knowledge Questionnaire Score   Pre Score 16/18           Core Components/Risk Factors/Patient Goals at Admission:  Personal Goals and Risk Factors at Admission - 11/06/19 1401      Core Components/Risk Factors/Patient Goals on Admission   Improve shortness of breath with ADL's Yes    Intervention Provide education, individualized exercise plan and daily activity instruction to help decrease symptoms of SOB with activities of daily living.    Expected Outcomes Short Term: Improve cardiorespiratory fitness to achieve a reduction of symptoms when performing ADLs;Long Term: Be able to perform more ADLs without symptoms or delay the onset of symptoms  Lipids Yes    Intervention Provide education and support for participant on nutrition & aerobic/resistive exercise along with prescribed medications to achieve LDL <76m, HDL >460m    Expected Outcomes Short Term: Participant states understanding of desired cholesterol values and is compliant with medications prescribed. Participant is following exercise prescription and nutrition guidelines.;Long Term: Cholesterol controlled with medications as prescribed, with  individualized exercise RX and with personalized nutrition plan. Value goals: LDL < 7072mHDL > 40 mg.           Core Components/Risk Factors/Patient Goals Review:   Goals and Risk Factor Review    Row Name 11/18/19 1402 12/14/19 1013 12/29/19 0903         Core Components/Risk Factors/Patient Goals Review   Personal Goals Review Develop more efficient breathing techniques such as purse lipped breathing and diaphragmatic breathing and practicing self-pacing with activity.;Improve shortness of breath with ADL's;Increase knowledge of respiratory medications and ability to use respiratory devices properly. Develop more efficient breathing techniques such as purse lipped breathing and diaphragmatic breathing and practicing self-pacing with activity.;Improve shortness of breath with ADL's;Increase knowledge of respiratory medications and ability to use respiratory devices properly. Increase knowledge of respiratory medications and ability to use respiratory devices properly.;Improve shortness of breath with ADL's;Develop more efficient breathing techniques such as purse lipped breathing and diaphragmatic breathing and practicing self-pacing with activity.     Review Elizabeth Espinoza off to a great start and has completed 3 exercise sessions. Working on breathing techniques and pt admits it is hard to "think" about it doesn't feel natural. Advised pt that the more she performs this it will become second natured. Will continue to work with Elizabeth Espinoza this.  Elizabeth Espinoza learning to pace herself with exercise, she exercises daily with walking 2 1/2 miles a day.  Presently on the sci fit bike level 2.0 with METS 4.8 and Treadmill 3.0/1.0. Elizabeth Espinoza able to maintain her O2 saturation above 88 - this is an improvement as her O2 sat would decrease with any incline. Elizabeth Espinoza enjoyed exercising in pulmonary rehab especially since the covid pandemic.  [facility] but her pulmonologist does not feel it  is safe for her to exercise there d/t her IPF.  She is exercising at a high intensity and is very positive. Elizabeth Espinoza worked very hard while in pulmonary rehab.  She graduates 01/07/2020.  She is exercising at a high level on the bike and treadmill.  She also walks daily with her husband at home.     Expected Outcomes See Admission Goals For Elizabeth Espinoza continue to increase her workloads while in pulmonary rehab and continue her exercise at home after graduating from the program. For Elizabeth Espinoza continue to exercise at home after graduating from pulmonary rehab.            Core Components/Risk Factors/Patient Goals at Discharge (Final Review):   Goals and Risk Factor Review - 12/29/19 0903      Core Components/Risk Factors/Patient Goals Review   Personal Goals Review Increase knowledge of respiratory medications and ability to use respiratory devices properly.;Improve shortness of breath with ADL's;Develop more efficient breathing techniques such as purse lipped breathing and diaphragmatic breathing and practicing self-pacing with activity.    Review Elizabeth Espinoza worked very hard while in pulmonary rehab.  She graduates 01/07/2020.  She is exercising at a high level on the bike and treadmill.  She also walks daily with her husband at home.  Expected Outcomes For Elizabeth Espinoza to continue to exercise at home after graduating from pulmonary rehab.           ITP Comments:   Comments: ITP REVIEW Pt is making expected progress toward pulmonary rehab goals after completing 15 sessions. Recommend continued exercise, life style modification, education, and utilization of breathing techniques to increase stamina and strength and decrease shortness of breath with exertion.

## 2020-01-05 NOTE — Progress Notes (Signed)
Daily Session Note  Patient Details  Name: Elizabeth Espinoza MRN: [id number] Date of Birth: 1940/04/25 Referring Provider:   April Manson Pulmonary Rehab Walk Test from 11/06/2019 in [place]  Referring Provider Dr. Chase Caller      Encounter Date: 01/05/2020  Check In:  Session Check In - 01/05/20 1020      Check-In   Supervising physician immediately available to respond to emergencies [facility] immediately available    Physician(s) Dr. Cyndia Skeeters    Location [facility]    Staff Present Rosebud Poles, RN, BSN;Ivannah Zody Ysidro Evert, RN;Jessica Hassell Done, MS, ACSM-CEP, Exercise Physiologist    Virtual Visit No    Medication changes reported     No    Fall or balance concerns reported    No    Tobacco Cessation No Change    Warm-up and Cool-down Performed on first and last piece of equipment    Resistance Training Performed Yes    VAD Patient? No    PAD/SET Patient? No      Pain Assessment   Currently in Pain? No/denies           Capillary Blood Glucose: No results found for this or any previous visit (from the past 24 hour(s)).   Exercise Prescription Changes - 01/05/20 1100      Response to Exercise   Blood Pressure (Admit) 114/60    Blood Pressure (Exercise) 160/66    Blood Pressure (Exit) 110/62    Heart Rate (Admit) 71 bpm    Heart Rate (Exercise) 124 bpm    Heart Rate (Exit) 81 bpm    Oxygen Saturation (Admit) 98 %    Oxygen Saturation (Exercise) 90 %    Oxygen Saturation (Exit) 96 %    Rating of Perceived Exertion (Exercise) 12    Perceived Dyspnea (Exercise) 1.5    Duration Continue with 30 min of aerobic exercise without signs/symptoms of physical distress.    Intensity THRR unchanged      Progression   Progression Continue to progress workloads to maintain intensity without signs/symptoms of physical distress.      Resistance Training   Training Prescription Yes    Weight 3 lb. weigths    Reps 10-15    Time  10 Minutes      Treadmill   MPH 4    Grade 3    Minutes 15      Bike   Level 2.8    Minutes 15    METs 6.3           Social History   Tobacco Use  Smoking Status Never Smoker  Smokeless Tobacco Never Used    Goals Met:  Exercise tolerated well No report of cardiac concerns or symptoms Strength training completed today  Goals Unmet:  Not Applicable  Comments: Service time is from 0950 to 1110    Dr. Fransico Him is Medical Director for Cardiac Rehab at [facility].

## 2020-01-06 ENCOUNTER — Telehealth: Payer: Self-pay | Admitting: Internal Medicine

## 2020-01-06 NOTE — Telephone Encounter (Signed)
Patient wants to know if she needs pneumonia vaccine this year, also when should she have labs to check thyroid

## 2020-01-06 NOTE — Telephone Encounter (Signed)
My-chart message sent.

## 2020-01-06 NOTE — Telephone Encounter (Signed)
Left message for patient

## 2020-01-07 ENCOUNTER — Other Ambulatory Visit (INDEPENDENT_AMBULATORY_CARE_PROVIDER_SITE_OTHER): Payer: Medicare Other

## 2020-01-07 ENCOUNTER — Other Ambulatory Visit: Payer: Self-pay

## 2020-01-07 ENCOUNTER — Encounter (HOSPITAL_COMMUNITY)
Admission: RE | Admit: 2020-01-07 | Discharge: 2020-01-07 | Disposition: A | Discharge status: Home / Self Care | Payer: Medicare Other | Source: Ambulatory Visit | Attending: Internal Medicine | Admitting: Internal Medicine

## 2020-01-07 DIAGNOSIS — J84112 Idiopathic pulmonary fibrosis: Secondary | ICD-10-CM

## 2020-01-07 DIAGNOSIS — E039 Hypothyroidism, unspecified: Secondary | ICD-10-CM

## 2020-01-07 LAB — TSH: TSH: 0.88 u[IU]/mL (ref 0.35–4.50)

## 2020-01-07 NOTE — Progress Notes (Signed)
Daily Session Note  Patient Details  Name: Elizabeth Espinoza MRN: [id number] Date of Birth: 11/17/1940 Referring Provider:   April Manson Pulmonary Rehab Walk Test from 11/06/2019 in [place]  Referring Provider Dr. Chase Caller      Encounter Date: 01/07/2020  Check In:  Session Check In - 01/07/20 1020      Check-In   Supervising physician immediately available to respond to emergencies [facility] immediately available    Physician(s) Dr. Algis Liming    Location [facility]    Staff Present Rosebud Poles, RN, BSN;Lisa Ysidro Evert, RN;Deontra Pereyra Hassell Done, MS, ACSM-CEP, Exercise Physiologist    Virtual Visit No    Medication changes reported     No    Fall or balance concerns reported    No    Tobacco Cessation No Change    Warm-up and Cool-down Not performed (comment)    Resistance Training Performed No    VAD Patient? No    PAD/SET Patient? No      Pain Assessment   Currently in Pain? No/denies    Multiple Pain Sites No           Capillary Blood Glucose: No results found for this or any previous visit (from the past 24 hour(s)).    Social History   Tobacco Use  Smoking Status Never Smoker  Smokeless Tobacco Never Used    Goals Met:  Proper associated with RPD/PD & O2 Sat [place] with exercise equipment Exercise tolerated well No report of cardiac concerns or symptoms  Goals Unmet:  Not Applicable  Comments: Service time is from 0950 to 1020 Patient completed post 6 minute walk test today and completed pulmonary rehab.    Dr. Fransico Him is Medical Director for Cardiac Rehab at [facility].

## 2020-02-02 DIAGNOSIS — Z79899 Other long term (current) drug therapy: Secondary | ICD-10-CM | POA: Diagnosis not present

## 2020-02-02 DIAGNOSIS — H40013 Open angle with borderline findings, low risk, bilateral: Secondary | ICD-10-CM | POA: Diagnosis not present

## 2020-02-02 DIAGNOSIS — L438 Other lichen planus: Secondary | ICD-10-CM | POA: Diagnosis not present

## 2020-02-02 DIAGNOSIS — H04123 Dry eye syndrome of bilateral lacrimal glands: Secondary | ICD-10-CM | POA: Diagnosis not present

## 2020-02-02 DIAGNOSIS — Z961 Presence of intraocular lens: Secondary | ICD-10-CM | POA: Diagnosis not present

## 2020-02-02 NOTE — Addendum Note (Signed)
Encounter addended by: George Ina, RD on: 02/02/2020 7:23 AM  Actions taken: Flowsheet accepted

## 2020-02-04 ENCOUNTER — Other Ambulatory Visit: Payer: Self-pay

## 2020-02-04 ENCOUNTER — Ambulatory Visit (INDEPENDENT_AMBULATORY_CARE_PROVIDER_SITE_OTHER)
Admission: RE | Admit: 2020-02-04 | Discharge: 2020-02-04 | Disposition: A | Discharge status: Home / Self Care | Payer: Medicare Other | Source: Ambulatory Visit | Attending: Internal Medicine | Admitting: Internal Medicine

## 2020-02-04 DIAGNOSIS — J479 Bronchiectasis, uncomplicated: Secondary | ICD-10-CM | POA: Diagnosis not present

## 2020-02-04 DIAGNOSIS — J84112 Idiopathic pulmonary fibrosis: Secondary | ICD-10-CM

## 2020-02-04 DIAGNOSIS — K449 Diaphragmatic hernia without obstruction or gangrene: Secondary | ICD-10-CM | POA: Diagnosis not present

## 2020-02-04 DIAGNOSIS — I700 Atherosclerosis of aorta: Secondary | ICD-10-CM | POA: Diagnosis not present

## 2020-02-04 DIAGNOSIS — I2510 Atherosclerotic heart disease of native coronary artery without angina pectoris: Secondary | ICD-10-CM | POA: Diagnosis not present

## 2020-02-08 NOTE — Progress Notes (Signed)
No change in IPF Has coronary artery calcification Has skeletal sclersosis  No new findings  Wil discuss feb 2022 visit

## 2020-02-09 NOTE — Addendum Note (Signed)
Encounter addended by: Lance Morin, RN on: 02/09/2020 9:32 AM  Actions taken: Clinical Note Signed, Episode resolved

## 2020-02-09 NOTE — Progress Notes (Signed)
Discharge Progress Report  Patient Details  Name: Elizabeth Espinoza MRN: [id number] Date of Birth: October 20, 1940 Referring Provider:   [facility] Walk Test from 11/06/2019 in [place]  Referring Provider Dr. Chase Caller       Number of Visits: 17  Reason for Discharge:  Patient reached a stable level of exercise. Patient independent in their exercise. Patient has met program and personal goals.  Smoking History:  Social History   Tobacco Use  Smoking Status Never Smoker  Smokeless Tobacco Never Used    Diagnosis:  IPF (idiopathic pulmonary fibrosis) ([facility])  ADL [facility]:  Pulmonary Assessment Scores    Row Name 11/06/19 1549 01/07/20 1416       ADL [facility]   ADL Phase Entry Exit    SOB Score total 51 29         CAT Score   CAT Score 20 18         mMRC Score   mMRC Score 2 0           Initial Exercise Prescription:  Initial Exercise Prescription - 11/06/19 1600      Date of Initial Exercise RX and Referring Provider   Date 11/06/19    Referring Provider Dr. Chase Caller    Expected Discharge Date 01/07/20      Treadmill   Minutes 15      Bike   Level 2    Minutes 15      Prescription Details   Frequency (times per week) 2    Duration Progress to 30 minutes of continuous aerobic without signs/symptoms of physical distress      Intensity   THRR 40-80% of Max Heartrate 56-113    Ratings of Perceived Exertion 11-13    Perceived Dyspnea 0-4      Progression   Progression Continue to progress workloads to maintain intensity without signs/symptoms of physical distress.      Resistance Training   Training Prescription Yes    Weight orange bands    Reps 10-15           Discharge Exercise Prescription (Final Exercise Prescription Changes):  Exercise Prescription Changes - 01/05/20 1100      Response to Exercise   Blood Pressure (Admit) 114/60    Blood Pressure (Exercise) 160/66    Blood Pressure (Exit)  110/62    Heart Rate (Admit) 71 bpm    Heart Rate (Exercise) 124 bpm    Heart Rate (Exit) 81 bpm    Oxygen Saturation (Admit) 98 %    Oxygen Saturation (Exercise) 90 %    Oxygen Saturation (Exit) 96 %    Rating of Perceived Exertion (Exercise) 12    Perceived Dyspnea (Exercise) 1.5    Duration Continue with 30 min of aerobic exercise without signs/symptoms of physical distress.    Intensity THRR unchanged      Progression   Progression Continue to progress workloads to maintain intensity without signs/symptoms of physical distress.      Resistance Training   Training Prescription Yes    Weight 3 lb. weigths    Reps 10-15    Time 10 Minutes      Treadmill   MPH 4    Grade 3    Minutes 15      Bike   Level 2.8    Minutes 15    METs 6.3           Functional Capacity:  6 Minute Walk  [place] Name 11/06/19 1552 01/07/20 1419       6 Minute Walk   Phase Initial Discharge    Distance 1412 feet 2091 feet    Distance % Change -- 48.1 %    Distance Feet Change -- 679 ft    Walk Time 6 minutes 6 minutes    # of Rest Breaks 2  Had to stop her twice because her oxygen saturation dropped below 88% 1  Had to stop patient at minute 3 to perform pursed lip breathing. SaO2 was 85% and increased to 92%.    MPH 2.67 3.96    METS 3.21 4.57    RPE 11 11    Perceived Dyspnea  1 1.5    VO2 Peak 11.24 16    Symptoms Yes (comment)  Oxygen saturation dropped to 83% Yes (comment)    Comments -- Oxygen saturation dropped to 85%, stopped pt and had her perform pursed lip breathing. SaO2 quickly increased to 92% and pt resumed walking. Oxygen saturation maintained above 88% for the remainder of the walk test.    Resting HR 80 bpm 79 bpm    Resting BP 124/70 118/64    Resting Oxygen Saturation  99 % 98 %    Exercise Oxygen Saturation  during 6 min walk 83 % 85 %    Max Ex. HR 117 bpm 122 bpm    Max Ex. BP 154/70 162/68    2 Minute Post BP 116/68 150/70         Interval HR   1 Minute HR  78 101    2 Minute HR 105 103    3 Minute HR 114 122    4 Minute HR 114 118    5 Minute HR 117 115    6 Minute HR 116 122    2 Minute Post HR 72 82    Interval Heart Rate? Yes Yes         Interval Oxygen   Interval Oxygen? Yes Yes    Baseline Oxygen Saturation % 99 % 98 %    1 Minute Oxygen Saturation % 84 % 89 %    1 Minute Liters of Oxygen 0 L 0 L    2 Minute Oxygen Saturation % 83 % 87 %    2 Minute Liters of Oxygen 0 L 0 L    3 Minute Oxygen Saturation % 85 % 85 %    3 Minute Liters of Oxygen 0 L 0 L    4 Minute Oxygen Saturation % 88 % 90 %    4 Minute Liters of Oxygen 0 L 0 L    5 Minute Oxygen Saturation % 91 % 94 %    5 Minute Liters of Oxygen 0 L 0 L    6 Minute Oxygen Saturation % 87 % 88 %    6 Minute Liters of Oxygen 0 L 0 L    2 Minute Post Oxygen Saturation % 100 % 100 %    2 Minute Post Liters of Oxygen 0 L 0 L           Psychological, QOL, Others - Outcomes: PHQ 2/9: Depression screen [facility] 2/9 01/07/2020 01/07/2020 11/06/2019 11/06/2019 07/09/2019  Decreased Interest 0 0 0 0 0  Down, Depressed, Hopeless 0 0 0 0 0  PHQ - 2 Score 0 0 0 0 0  Altered sleeping 2 - 3 - -  Tired, decreased energy 0 - 3 - -  Change in appetite 0 -  0 - -  Feeling bad or failure about yourself  0 - 0 - -  Trouble concentrating 0 - 0 - -  Moving slowly or fidgety/restless 0 - 0 - -  Suicidal thoughts 0 - 0 - -  PHQ-9 Score 2 - 6 - -  Difficult doing work/chores Not difficult at all - Not difficult at all - -  Some recent data might be hidden    Quality of Life:   Personal Goals: Goals established at orientation with interventions provided to work toward goal.  Personal Goals and Risk Factors at Admission - 11/06/19 1401      Core Components/Risk Factors/Patient Goals on Admission   Improve shortness of breath with ADL's Yes    Intervention Provide education, individualized exercise plan and daily activity instruction to help decrease symptoms of SOB with activities of  daily living.    Expected Outcomes Short Term: Improve cardiorespiratory fitness to achieve a reduction of symptoms when performing ADLs;Long Term: Be able to perform more ADLs without symptoms or delay the onset of symptoms    Lipids Yes    Intervention Provide education and support for participant on nutrition & aerobic/resistive exercise along with prescribed medications to achieve LDL <13m, HDL >490m    Expected Outcomes Short Term: Participant states understanding of desired cholesterol values and is compliant with medications prescribed. Participant is following exercise prescription and nutrition guidelines.;Long Term: Cholesterol controlled with medications as prescribed, with individualized exercise RX and with personalized nutrition plan. Value goals: LDL < 7045mHDL > 40 mg.            Personal Goals Discharge:  Goals and Risk Factor Review    Row Name 11/18/19 1402 12/14/19 1013 12/29/19 0903         Core Components/Risk Factors/Patient Goals Review   Personal Goals Review Develop more efficient breathing techniques such as purse lipped breathing and diaphragmatic breathing and practicing self-pacing with activity.;Improve shortness of breath with ADL's;Increase knowledge of respiratory medications and ability to use respiratory devices properly. Develop more efficient breathing techniques such as purse lipped breathing and diaphragmatic breathing and practicing self-pacing with activity.;Improve shortness of breath with ADL's;Increase knowledge of respiratory medications and ability to use respiratory devices properly. Increase knowledge of respiratory medications and ability to use respiratory devices properly.;Improve shortness of breath with ADL's;Develop more efficient breathing techniques such as purse lipped breathing and diaphragmatic breathing and practicing self-pacing with activity.     Review JudDreyah off to a great start and has completed 3 exercise sessions. Working on  breathing techniques and pt admits it is hard to "think" about it doesn't feel natural. Advised pt that the more she performs this it will become second natured. Will continue to work with JudCharlett Nose this.  JudDashanae learning to pace herself with exercise, she exercises daily with walking 2 1/2 miles a day.  Presently on the sci fit bike level 2.0 with METS 4.8 and Treadmill 3.0/1.0. JudAldora able to maintain her O2 saturation above 88 - this is an improvement as her O2 sat would decrease with any incline. JudElys enjoyed exercising in pulmonary rehab especially since the covid pandemic.  [facility] but her pulmonologist does not feel it is safe for her to exercise there d/t her IPF.  She is exercising at a high intensity and is very positive. JudRaymonas worked very hard while in pulmonary rehab.  She graduates 01/07/2020.  She is exercising at  a high level on the bike and treadmill.  She also walks daily with her husband at home.     Expected Outcomes See Admission Goals For Olive to continue to increase her workloads while in pulmonary rehab and continue her exercise at home after graduating from the program. For Lasha to continue to exercise at home after graduating from pulmonary rehab.            Exercise Goals and Review:  Exercise Goals    Row Name 11/06/19 1551             Exercise Goals   Increase Physical Activity Yes       Intervention Provide advice, education, support and counseling about physical activity/exercise needs.;Develop an individualized exercise prescription for aerobic and resistive training based on initial evaluation findings, risk stratification, comorbidities and participant's personal goals.       Expected Outcomes Short Term: Attend rehab on a regular basis to increase amount of physical activity.;Long Term: Add in home exercise to make exercise part of routine and to increase amount of physical activity.;Long Term: Exercising regularly  at least 3-5 days a week.       Increase Strength and Stamina Yes       Intervention Provide advice, education, support and counseling about physical activity/exercise needs.;Develop an individualized exercise prescription for aerobic and resistive training based on initial evaluation findings, risk stratification, comorbidities and participant's personal goals.       Expected Outcomes Short Term: Increase workloads from initial exercise prescription for resistance, speed, and METs.;Short Term: Perform resistance training exercises routinely during rehab and add in resistance training at home;Long Term: Improve cardiorespiratory fitness, muscular endurance and strength as measured by increased METs and functional capacity (6MWT)       Able to understand and use rate of perceived exertion (RPE) scale Yes       Intervention Provide education and explanation on how to use RPE scale       Expected Outcomes Short Term: Able to use RPE daily in rehab to express subjective intensity level;Long Term:  Able to use RPE to guide intensity level when exercising independently       Able to understand and use Dyspnea scale Yes       Intervention Provide education and explanation on how to use Dyspnea scale       Expected Outcomes Short Term: Able to use Dyspnea scale daily in rehab to express subjective sense of shortness of breath during exertion;Long Term: Able to use Dyspnea scale to guide intensity level when exercising independently       Knowledge and understanding of Target Heart Rate Range (THRR) Yes       Intervention Provide education and explanation of THRR including how the numbers were predicted and where they are located for reference       Expected Outcomes Short Term: Able to state/look up THRR;Long Term: Able to use THRR to govern intensity when exercising independently;Short Term: Able to use daily as guideline for intensity in rehab       Understanding of Exercise Prescription Yes        Intervention Provide education, explanation, and written materials on patient's individual exercise prescription       Expected Outcomes Short Term: Able to explain program exercise prescription;Long Term: Able to explain home exercise prescription to exercise independently              Exercise Goals Re-Evaluation:  Exercise Goals Re-Evaluation    Row Name  11/19/19 0710 12/10/19 0841 01/04/20 1619         Exercise Goal Re-Evaluation   Exercise Goals Review Increase Physical Activity;Increase Strength and Stamina;Able to understand and use rate of perceived exertion (RPE) scale;Able to understand and use Dyspnea scale;Knowledge and understanding of Target Heart Rate Range (THRR);Understanding of Exercise Prescription Increase Physical Activity;Increase Strength and Stamina;Able to understand and use rate of perceived exertion (RPE) scale;Able to understand and use Dyspnea scale;Knowledge and understanding of Target Heart Rate Range (THRR);Understanding of Exercise Prescription Increase Physical Activity;Increase Strength and Stamina;Able to understand and use rate of perceived exertion (RPE) scale;Able to understand and use Dyspnea scale;Knowledge and understanding of Target Heart Rate Range (THRR);Understanding of Exercise Prescription     Comments Pt has completed 3 exercise sessions and has tolerated very well. Pt is very motivated and pushes herself to make progressions. She is currently exercisisng at 4.8 METS on the Bike. Will continue to monitor and progress as able. Pt has completed 9 exercise sessions and has been making very good progression with workloads and METS. She is very active and very motivated to exercise. She is exercising at 6.0 METS on the bike and 6.7 METS on the treadmill. Will continue to monitor and progress as able. Elizabeth Espinoza has completed 15 exercise sessions and has made excellent progression. She works very hard during rehab and can tell she is making improvements. She only  has a few more sessions before she graduates from the program. She is very high functioning and is exercising at 6.2 METS on the bike. Will continue to monitor and progress as she is able. She has a plan to keep exercising after she graduates from the program.     Expected Outcomes Through exercise at rehab and home the patient will decrease shortness of breath with daily activities and feel confident in carrying out an exercise regimn at home. Through exercise at rehab and home the patient will decrease shortness of breath with daily activities and feel confident in carrying out an exercise regimn at home. Through exercise at rehab and home the patient will decrease shortness of breath with daily activities and feel confident in carrying out an exercise regimn at home.            Nutrition & Weight - Outcomes:  Pre Biometrics - 11/06/19 1603      Pre Biometrics   Grip Strength 34 kg           Post Biometrics - 01/07/20 1023       Post  Biometrics   Grip Strength 22 kg           Nutrition:  Nutrition Therapy & Goals - 11/17/19 1335      Nutrition Therapy   Diet General healthful      Personal Nutrition Goals   Nutrition Goal Pt to continue to build a healthy plate including vegetables, fruits, whole grains, and low-fat dairy products in a generally healthy meal plan.      Intervention Plan   Intervention Prescribe, educate and counsel regarding individualized specific dietary modifications aiming towards targeted core components such as weight, hypertension, lipid management, diabetes, heart failure and other comorbidities.    Expected Outcomes Short Term Goal: Understand basic principles of dietary content, such as calories, fat, sodium, cholesterol and nutrients.           Nutrition Discharge:  Nutrition Assessments - 11/17/19 1336      Rate Your Plate Scores   Pre Score 60  Education Questionnaire Score:  Knowledge Questionnaire Score - 01/07/20 1417       Knowledge Questionnaire Score   Post Score 17/18           Goals reviewed with patient; copy given to patient.

## 2020-02-24 DIAGNOSIS — Z79899 Other long term (current) drug therapy: Secondary | ICD-10-CM | POA: Diagnosis not present

## 2020-02-24 DIAGNOSIS — L661 Lichen planopilaris: Secondary | ICD-10-CM | POA: Diagnosis not present

## 2020-02-24 DIAGNOSIS — L650 Telogen effluvium: Secondary | ICD-10-CM | POA: Diagnosis not present

## 2020-03-03 ENCOUNTER — Other Ambulatory Visit (HOSPITAL_COMMUNITY)
Admission: RE | Admit: 2020-03-03 | Discharge: 2020-03-03 | Disposition: A | Discharge status: Home / Self Care | Payer: Medicare Other | Source: Ambulatory Visit | Attending: Internal Medicine | Admitting: Internal Medicine

## 2020-03-03 DIAGNOSIS — Z20822 Contact with and (suspected) exposure to covid-19: Secondary | ICD-10-CM | POA: Diagnosis not present

## 2020-03-03 LAB — SARS CORONAVIRUS 2 (TAT 6-24 HRS): SARS Coronavirus 2: NEGATIVE

## 2020-03-04 ENCOUNTER — Other Ambulatory Visit: Payer: Self-pay

## 2020-03-04 ENCOUNTER — Ambulatory Visit: Payer: Medicare Other | Admitting: Internal Medicine

## 2020-03-04 ENCOUNTER — Ambulatory Visit (INDEPENDENT_AMBULATORY_CARE_PROVIDER_SITE_OTHER): Payer: Medicare Other | Admitting: Internal Medicine

## 2020-03-04 ENCOUNTER — Encounter: Payer: Self-pay | Admitting: Internal Medicine

## 2020-03-04 VITALS — BP 122/72 | HR 80 | Temp 98.0°F | Ht 66.0 in | Wt 144.0 lb

## 2020-03-04 DIAGNOSIS — J84112 Idiopathic pulmonary fibrosis: Secondary | ICD-10-CM | POA: Diagnosis not present

## 2020-03-04 LAB — PULMONARY FUNCTION TEST
DL/VA % pred: 70 %
DL/VA: 2.83 ml/min/mmHg/L
DLCO cor % pred: 63 %
DLCO cor: 12.94 ml/min/mmHg
DLCO unc % pred: 63 %
DLCO unc: 12.94 ml/min/mmHg
FEF 25-75 Pre: 2.11 L/sec
FEF2575-%Pred-Pre: 129 %
FEV1-%Pred-Pre: 108 %
FEV1-Pre: 2.39 L
FEV1FVC-%Pred-Pre: 104 %
FEV6-%Pred-Pre: 109 %
FEV6-Pre: 3.06 L
FEV6FVC-%Pred-Pre: 104 %
FVC-%Pred-Pre: 104 %
FVC-Pre: 3.07 L
Pre FEV1/FVC ratio: 78 %
Pre FEV6/FVC Ratio: 99 %

## 2020-03-04 NOTE — Progress Notes (Signed)
Spirometry and dlco done today. 

## 2020-03-04 NOTE — Patient Instructions (Signed)
ICD-10-CM   1. IPF (idiopathic pulmonary fibrosis) ([facility])  J84.112     Stable disease but subjectively better after stopping pirfenidone and completing pulmonary rehabilitation  Respect your desire not to try the approved antifibrotic nintedanib due to previous significant side effects with pirfenidone  Plan - meet Reba Morris from [employer] and get research consent for [employer] study -do standard of care spirometry and DLCO in 6 months -Continue to keep yourself active and avoid getting respiratory illnesses  Follow-up -Return to see Dr. Chase Caller in a 30-minute visit in 6 months  -ILD symptom score and simple walking desaturation test at follow-up

## 2020-03-04 NOTE — Addendum Note (Signed)
Addended byCoralie Keens on: 03/04/2020 02:17 PM   Modules accepted: Orders

## 2020-03-04 NOTE — Progress Notes (Signed)
IOV 12/04/2017: Dr Lamonte Sakai: [name] is a 80 year old never smoker with a history of allergic rhinitis, GERD, hypothyroidism, MVP.  She is been seen in our office in the past by Dr. Gwenette Greet for restless leg syndrome-she still treated for this with Requip. On allegra, nexium prn.   She is referred today for dyspnea.  She reports that she has had an exercise routine where she walked w her husband 2 miles, exercises at the [facility], for several years. She has noticed more SOB, especially with mild hills. Sometimes now has to stop briefly to complete the walk. Occasional chest tightness, no wheeze. No overt CP. She reports daily cough, often in the am, sometimes clear mucous. She is on allegra. She had a walking stress test 09/2014 > reassuring ECG w exercise. Weight has been stable. Her last TSH was 06/25/17, 0.73.   Review of her notes shows hx possible GGI on prior CT 2014.   ROV 02/26/18 Elizabeth Espinoza --80 year old never smoker with a history of mitral valve prolapse whom I saw earlier this year for exertional dyspnea and chronic cough.  Pulmonary function testing consistent with restriction.  She has GERD and allergic rhinitis both of which could be contributing to her chronic cough.  I performed a high-resolution CT scan of the chest on 1/20 and reviewed today, this shows some patchy confluent subpleural reticular disease and groundglass attenuation with some minimal traction bronchiectasis and no frank honeycomb change.  This reflects a progression compared with 2014 and is an NSIP pattern.  Walking oximetry at her last visit did not show any exertional desaturation. She is still coughing, is using fexofenadine, takes nexium qod.   [place], had pesticide exposure.   ROV 08/26/2018 Elizabeth Espinoza --follow-up visit for 80 year old woman with a history of mitral valve prolapse, chronic cough, dyspnea with restrictive lung disease noted on pulmonary function testing.  High Res CT scan of the chest shows  some mild interstitial disease.  She was also having cough - better with flonase; still on nexium, allegra. Her exertional tolerance is improved, she has been exercising more. She remains on Requip for RLS.   We performed autoimmune labs last time, ANA positive at low titer (1: 80), SSA and SSB negative, RNP negative, RF negative, CCP negative, Anti-Smith negative, anti-SCL negative, DS DNA negative, anti-Jo negative, aldolase negative. She hasn't been seen in ILD clinic yet.    OV 10/08/2018  Subjective:  Patient ID: Elizabeth Espinoza, female , DOB: 1940-07-26 , age 62 y.o. , MRN: [id number] , ADDRESS: [place] [address]   10/08/2018 -   Chief Complaint  Patient presents with  . Interstitial Lung Disease    Breathing the same as it was during August 2020 office visit with Dr. Lamonte Sakai     HPI Elizabeth Espinoza 80 y.o. -has been referred to the interstitial lung disease clinic because of findings of interstitial lung disease.  History is gathered from talking to her, review of Dr. Collene Gobble notes and also the integrated ILD questionnaire.  Briefly, she tells me that she was working out at [employer] and would just notice occasional dyspnea but she really did not compare it with other people.  Then in October 2019 she started walking 3 miles daily except on the days it rains with a husband.  During this time she noticed that she was falling really behind because of shortness of breath and exertional fatigue.  This resulted in  subsequent evaluation all documented above.  Findings of interstitial lung disease with subpleural reticulation suggestive of an alternative diagnosis.  Autoimmune profile essentially negative other than trace positive ANA.  She tells me that her significant major problem is exertional dyspnea when walking stairs or walking several miles.  She did not desaturate in our office several months ago.  She does not know if she desaturates when she exerts walking 3  miles.  Pulmonary function test earlier this year is just isolated low DLCO.  She also has like a cough.  Overall since the onset of the symptoms by exercising herself more and conditioning she is somewhat better.  She did see Dr. Kirk Ruths in May 2020 and in June 2020 had a coronary calcium CT which appears to have no calcium deposits.   [place] Integrated Comprehensive ILD Questionnaire  Symptoms:  -Dyspnea started suddenly and since it started it is better.  She says it is been present for years although she did tell me that it is only there since October 2019 when she noticed that.  Severity is listed below.  She does have a cough almost 1 year.  Since it started it is better.  It is mostly in the morning.  She does bring up some phlegm.  Early on in the morning it is green or yellow.  Since it started it is the same/better.  There is no wheezing.  She does have some chest tightness with this when she walks.  It is relieved by rest.  Cardiac work-up in June 2020 showed no calcium deposits.   Past Medical History : Positive for chronic longstanding acid reflux disease and thyroid disease..  In addition CT scan from January 2020 shows hiatal hernia that is small..  This presence of  sclerosis in the bony structures in January 2020.-Primary care physician has been sent a message today.  There is no asthma or COPD or heart failure rheumatoid arthritis or collagen vascular disease.  She does have GERD and hiatal hernia for several years to decades.  No sleep apnea.  No blood clots.  No hepatitis.  No tuberculosis.  No pleurisy.  , ANA positive at low titer (1: 80), SSA and SSB negative, RNP negative, RF negative, CCP negative, Anti-Smith negative, anti-SCL negative, DS DNA negative, anti-Jo negative, aldolase negative. She hasn't been seen in ILD clinic yet.    ROS:  -She does have fatigue for the last several years.  She does have some back and hip issues.  She does have dry eyes.  She does have  like some dysphagia.  There is presence of hiatal hernia-there is a small.  Acid reflux for several decades.  She also reports presence of nonspecific rash   FAMILY HISTORY of LUNG DISEASE: * -Her father died of mesothelioma in nineteen 83/1984 at the age of 80 otherwise no lung disease.   EXPOSURE HISTORY:   -When she was 17 she smokes cigarettes but otherwise no cigarette or tobacco use or electronic cigarette.  Never smoked marijuana.  No cocaine use no intravenous drug use.   HOME and HOBBY DETAILS :  -Single[place]family home [place] setting for the last 16 years in a 80 year old home.  No mold or mildew exposure in the [facility] duct or CPAP mask or humidifier.  No mold or mildew in the bathroom.  No pet birds in the house.  No misting [place].  No feather pillows no feather duvet.  No musical instruments.  She does some occasional gardening which she  likes.  She does do some fine-needle work.   OCCUPATIONAL HISTORY (122 questions) :  = Essentially negative except for the fact when she was a child she did some tobacco growing.  She has done home gardening for 50 years.    PULMONARY TOXICITY HISTORY (27 items):  denies  Results for KAYDEE, MAGEL (MRN [id number]) as of 11/07/2018 11:20  Ref. Range 02/26/2018 12:17  Anti Nuclear Antibody (ANA) Latest Ref Range: NEGATIVE  POSITIVE (A)  ANA Pattern 1 Unknown Nuclear, Speckled (A)  ANA Titer 1 Latest Units: titer 1:80 (H)  Anti JO-1 Latest Ref Range: 0.0 - 0.9 AI <7.4  Cyclic Citrullin Peptide Ab Latest Units: UNITS <16  ds DNA Ab Latest Units: IU/mL <1  RA Latex Turbid. Latest Ref Range: <14 IU/mL <14  ENA SM Ab Ser-aCnc Latest Ref Range: <1.0 NEG AI <1.0 NEG  Ribonucleic Protein(ENA) Antibody, IgG Latest Ref Range: <1.0 NEG AI <1.0 NEG  SSA (Ro) (ENA) Antibody, IgG Latest Ref Range: <1.0 NEG AI <1.0 NEG  SSB (La) (ENA) Antibody, IgG Latest Ref Range: <1.0 NEG AI <1.0 NEG  Scleroderma (Scl-70) (ENA) Antibody, IgG Latest Ref Range: <1.0 NEG AI  <1.0 NEG   Results for HARMONII, KARLE (MRN [id number]) as of 11/07/2018 11:20  Ref. Range 01/29/2018 14:31  FVC-Pre Latest Units: L 2.97  FVC-%Pred-Pre Latest Units: % 98  Results for AIRI, COPADO (MRN [id number]) as of 11/07/2018 11:20  Ref. Range 01/29/2018 14:31  DLCO unc Latest Units: ml/min/mmHg 14.80  DLCO unc % pred Latest Units: % 54    OV 11/07/2018  Subjective:  Patient ID: Elizabeth Espinoza, female , DOB: [birth year] , age 10 y.o. , MRN: [id number] , ADDRESS: [place] [address]   11/07/2018 -   Chief Complaint  Patient presents with  . Follow-up    Patient reports that she has sob with any exertion.    Follow-up interstitial lung disease  HPI Elizabeth Espinoza 80 y.o. -last seen September 2020.  After that she was supposed to have follow-up high-resolution CT chest and spirometry DLCO.  For some reason the spirometry DLCO is not done.  In the interim her symptoms remain the same as shown by the symptom score below.  She does note when she does heavy exertion such as climbing stairs or long uphill walks her pulse ox drops to 84% but quickly regains.  She is not interested in portable oxygen.  She had a repeat high-resolution CT chest in September 2020 and when compared to January 2020 there is no significant change.  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  I personally visualized the film.  There is diffuse bilateral subpleural reticulation and some traction bronchiectasis.  I myself would say it may be this is indeterminate for UIP.  But there is some minimal air trapping and she is done some gardening work.  There is no upper zonal predominance that would fit in with hypersensitive pneumonitis.  She has incidental findings of bony sclerosis that is again repeated in the CT scan.  Her primary care physician Billey Gosling evaluated her.  I reviewed the note.  She is been referred to  Dr. Narda Rutherford in hematology who she is seeing November 12, 2018.  She is very confused about the fact that she has a bony sclerosis problem for which she is being referred to a blood doctor [hematologist] and she thought she is having  a blood test that was ordered by me.  I reviewed the chart and clarified this concepts   Ct Chest High Resolution  Result Date: 11/05/2018 CLINICAL DATA:  80 year old female with history of interstitial lung disease. Increased shortness of breath and cough over the past year. EXAM: CT CHEST WITHOUT CONTRAST TECHNIQUE: Multidetector CT imaging of the chest was performed following the standard protocol without intravenous contrast. High resolution imaging of the lungs, as well as inspiratory and expiratory imaging, was performed. COMPARISON:  High-resolution chest CT 02/10/2018. FINDINGS: Cardiovascular: Heart size is normal. There is no significant pericardial fluid, thickening or pericardial calcification. There is aortic atherosclerosis, as well as atherosclerosis of the great vessels of the mediastinum and the coronary arteries, including calcified atherosclerotic plaque in the left anterior descending and right coronary arteries. Mild calcifications of the mitral annulus. Mediastinum/Nodes: No pathologically enlarged mediastinal or hilar lymph nodes. Please note that accurate exclusion of hilar adenopathy is limited on noncontrast CT scans. Esophagus is unremarkable in appearance. No axillary lymphadenopathy. Lungs/Pleura: High-resolution images demonstrates some patchy areas of peripheral predominant septal thickening and subpleural reticulation, with mild cylindrical bronchiectasis and peripheral bronchiolectasis. No frank honeycombing confidently identified at this time. These findings have no definitive craniocaudal gradient. In the periphery of the mid to upper lungs there also some plaque-like areas of apparent pleuroparenchymal scarring and volume loss. Inspiratory and  expiratory imaging demonstrates minimal air trapping indicative of very mild small airways disease. Overall, these imaging findings appear stable compared to the prior study. Upper Abdomen: Aortic atherosclerosis. Musculoskeletal: Mild diffuse sclerosis throughout the visualized axial and appendicular skeleton, similar to prior examinations. There are no definite focal aggressive appearing lytic or blastic lesions noted in the visualized portions of the skeleton. IMPRESSION: 1. 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 2. Aortic atherosclerosis, in addition to 2 vessel coronary artery disease. Assessment for potential risk factor modification, dietary therapy or pharmacologic therapy may be warranted, if clinically indicated. 3. Persistent mild diffuse sclerosis throughout the visualized osseous structures without discrete aggressive appearing osseous lesions. Clinical correlation for signs and symptoms of potential infiltrative process such as myelofibrosis is suggested. Aortic Atherosclerosis (ICD10-I70.0). Electronically Signed   By: Vinnie Langton M.D.   On: 11/05/2018 14:36     OV 12/22/2018  Subjective:  Patient ID: Elizabeth Espinoza, female , DOB: 09-17-1940 , age 34 y.o. , MRN: [id number] , ADDRESS: [place] [id number]   12/22/2018 -   Chief Complaint  Patient presents with  . Follow-up    Pt states she has been doing good since last visit. Pt is still coughing and will get up clear mucus in the morning.   Follow-up interstitial lung disease with CT scan October 2020 being indeterminate versus alternate diagnosis.  History of gardening.  Trace autoimmune ANA  positive.  Mild progression since 2014  HPI Elizabeth Espinoza 80 y.o. -returns for follow-up.  She presents with her husband who I am meeting for the first time.  In the interim she met Dr. Roxan Hockey thoracic surgeon for surgical lung biopsy.  Her husband also met with him.  I reviewed the note.  She tells me that given the morbidity with surgical lung biopsy she wants to undergo  bronchoscopy with lavage and transbronchial biopsy first.  She understands the inherent limitations of these procedure in terms of diagnosis.  But she wants to take the lower risk profile.  Overall she feels stable.  Her symptom score is improved compared to the past.  Her walking desaturation test shows exaggerated drop in pulse ox but this was done with her wearing the mask.  She did not feel any dyspnea.  In terms of her bony sclerosis she has seen Dr. Lorenso Courier at [facility].  I reviewed the note.  He has reassured her.  Risks of pneumothorax, hemothorax, sedation/anesthesia complications such as cardiac or respiratory arrest or hypotension, stroke and bleeding all explained. Benefits of diagnosis but limitations of non-diagnosis also explained. Patient verbalized understanding and wished to proceed.   They want to have the bronchoscopy after the holidays of Christmas and new year.  This is because their house is undergoing remodeling currently.       SYMPTOM SCALE - ILD      O2 use 10/08/2018  11/07/2018  12/22/2018   Shortness of Breath 0 -> 5 scale with 5 being worst (score 6 If unable to do)  ra  At rest 0 0 0  Simple tasks - showers, clothes change, eating, shaving 1 1 0  Household (dishes, doing bed, laundry) _0 Shopping 2 2 0.5  Walking level at own pace 2 1 0.5  Walking keeping up with others of same age 30 2 0  Walking up Stairs _1 Walking up Hill 5 5 3.5  Total (40 - 48) Dyspnea Score 20 18 9.52.5  How bad is your cough? 1 3 2.5  How bad is your fatigue 2 2.5 1.5 am, 4.5 pm      OV  03/16/2019  Subjective:  Patient ID: Elizabeth Espinoza, female , DOB: 1941-01-08 , age 72 y.o. , MRN: [id number] , ADDRESS: [place] [address]   03/16/2019 -   Chief Complaint  Patient presents with  . Follow-up    PFT performed today.  Pt states she has been doing okay since last visit and states her breathing is about the same.     Finally able to review esults of envisia send out test to [place]. Date of test is 02/05/2019. 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 -returns for follow-up to discuss bronchoscopy and lavage results.  In the interim no new respiratory issues.  They are all stable.  However she is having significant musculoskeletal issues with shoulder pain.  Serology from a year ago was negative.  They wanted to know about relatedness.  I told him it is probably not related.  We went over the bronchoscopy lavage results which showed some dominance of neutrophils consistent with UIP.  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   We had a long discussion about the benefits, risks and limitations of antifibrotic therapy.  We also discussed the choice between pirfenidone and nintedanib.       Results for ASHLEE, PLAYER (MRN [id number]) as of 03/16/2019 11:12  Ref. Range 01/29/2018 14:31 03/16/2019 08:42  FVC-Pre Latest Units: L 2.97 2.91  FVC-%Pred-Pre Latest Units: % 98 97   Results for ALANYS, GODINO (MRN [id number]) as of 03/16/2019 11:12  Ref. Range 01/29/2018 14:31 03/16/2019 08:42  DLCO unc Latest Units: ml/min/mmHg 14.80 12.32  DLCO unc % pred Latest Units: % 54  60     OV 06/11/2019 - telephine visit. Patient identified with 2 pHI, risks, benefit, limitations of tele visit explained  Subjective:  Patient ID: Elizabeth Espinoza, female , DOB: [birth year] , age 6 y.o. , MRN: [id number] , ADDRESS: [place] [address] ate of test is 02/05/2019. 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 -in this telephone visit patient is now on pirfenidone.  She says she is on pirfenidone since mid April 2021.  She is on 3 pills 3 times a day.  She spacing them 4 hours apart but having some intermittent nausea every few days.  She did have an urticaria but that was before she started pirfenidone was related to Covid vaccine that is now resolved.  She is asking about exercising at the [facility] and Covid precautions.  I advised her because she is vaccinated that the risk is low but not 0 and to take adequate precautions as tolerated.  She had liver function test and this is normal.  Her next appointment for pulmonary function test is in mid July 2021.  Recommended she make a face-to-face visit with me at that time.   OV 08/21/2019   Subjective:  Patient ID: Elizabeth Espinoza, female , DOB: 1940-11-14, age 57 y.o. years. , MRN: [id number],  ADDRESS: [place] [address] PCP  Binnie Rail, MD Providers : Treatment Team:  Attending Provider: Brand Males, MD  Type of visit: Telephone Circumstance: COVID-19 national emergency Identification of patient Elizabeth Espinoza - 2 person identifier Risks: Risks, benefits, limitations of telephone visit explained Patient location: home This provider location: [place] [facility]      Chief Complaint  Patient presents with  . Follow-up    PFT 7/27--c/o sob with stairs and throat clearing mainly in the morning. stopped Esbriet on 08/12/2019.   Follow-up interstitial lung disease with CT scan October 2020 being indeterminate versus alternate diagnosis.  History of gardening.  Trace autoimmune ANA positive.  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 -presents for this telephone visit for IPF.  On this telephone visit she was identified with 2 person identifier.  Risks, benefits and limitations of telephone visit explained.  After last visit her GI symptoms worsen.  She did see Dr. Young Berry gastroenterologist.  She underwent endoscopy.  Dr. Collene Mares did send the results to me I do not have this.  I remember Dr. Collene Mares calling me and discussing patient's GI side effects.  These were from pirfenidone so on August 12, 2019 based on my advise she start pirfenidone.  Since then her symptoms of GI nature have resolved.  Her respiratory symptoms continue to be stable.  These are all documented below.  At this point in time she feels that her quality of life is very important.  She does not want to go through the kind of GI side effect she went through with pirfenidone.  She would rather deal with the disease.  She is aware that nintedanib is  a standard of care therapeutic option.  She is also aware after discussion the clinical trials as a care option particularly in the past to discovering new therapies that do not have this GI side effects that pirfenidone poses.  She had a lot of questions about Covid vaccine.  She is fully vaccinated.  She is worried about her grandson was refusing the vaccine.      OV 09/29/2019   Subjective:  Patient ID: Elizabeth Espinoza, female , DOB: 01/13/1941, age 13 y.o. years. , MRN: [id number],  ADDRESS: [place] [address] PCP  Binnie Rail, MD Providers : Treatment Team:  Attending Provider: Brand Males, MD   Chief Complaint  Patient presents with  . Follow-up    LFT today?  pulmonary rehab or exercises.  inhaler trial, felt better after Advair.   Follow-up interstitial lung disease/IPF with CT scan October 2020 being indeterminate  versus alternate diagnosis.  History of gardening.  Trace autoimmune ANA positive.  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 Date of test is 02/05/2019. 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  -Date of giving diagnosis is March 16, 2019  - MDD 05/05/19  - esbriet since 05/06/19- stopped 08/12/19 due to severe GI side effect    HPI Elizabeth Espinoza 80 y.o. -    presesnts  With her husband for IPF followup.  Overall she is doing well.  She is not taking pirfenidone since July 2021.  After that she has gained weight.  All her GI symptoms have resolved.  She goes for daily walks.  She says when she reaches the top of the hill sometimes she desaturates to 88% but not always.  She monitors this closely.  She is asking for other exercises that can improve her endurance and lung function.  We discussed that exercises do not improve lung function.  She is willing to participate in pulmonary rehabilitation.  We discussed the alternative of taking nintedanib but because of the severe GI side effects that she had with Esbriet she is nervous about starting nintedanib and does not want to.  I introduced her to the concept of clinical trials as a care option.  At this point in time she wants to process this information.  We did discuss the fact that IPF is a progressive disease and antifibrotic's are indicated early in the course of the disease.  She prefers to have supportive care approach.  She wants to focus on strengthening her overall physical shape.     PFT Results Latest Ref Rng & Units 09/15/2019 03/16/2019 01/29/2018  FVC-Pre L 2.92 2.91 2.97  FVC-Predicted Pre % 99 97 98  FVC-Post L - - 2.89  FVC-Predicted Post % - - 95  Pre FEV1/FVC % % 80 79 81  Post FEV1/FCV % % - - 83  FEV1-Pre L 2.34 2.29 2.40  FEV1-Predicted Pre % 106 102 105  FEV1-Post L - - 2.39  DLCO uncorrected  ml/min/mmHg 12.84 12.32 14.80  DLCO UNC% % 63 60 54  DLCO corrected ml/min/mmHg 13.26 12.91 -  DLCO COR %Predicted % 65 63 -  DLVA Predicted % 71 73 68     ROS - per HPI       OV 03/04/2020  Subjective:  Patient ID: Elizabeth Espinoza, female , DOB: September 18, 1940 , age 89 y.o. ,  MRN: [id number] , ADDRESS: [place] [address] PCP Binnie Rail, MD Patient Care Team: Binnie Rail, MD as PCP - General (Internal Medicine) Stanford Breed Denice Bors, MD as Consulting Physician (Cardiology) Collene Gobble, MD as Consulting Physician (Pulmonary Disease) Charlton Haws, [facility] as Pharmacist (Pharmacist)  This Provider for this visit: Treatment Team:  Attending Provider: Brand Males, MD    03/04/2020 -   Chief Complaint  Patient presents with  . Follow-up    Doing better   Follow-up interstitial lung disease/IPF with CT scan October 2020 being indeterminate versus alternate diagnosis.  History of gardening.  Trace autoimmune ANA positive.  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  On supportive care  HPI Elizabeth Espinoza 80 y.o. -returns for follow-up.  She is on supportive care.  She declined nintedanib.  She says she is now finished rehab.  She says without Esbriet and after rehab she is a lot less symptomatic as evidenced below and has symptom score.  Also GI symptoms have resolved.  She is amazed by the beneficial effects of rehab.  She has no new complaints.  She is very thankful for pulmonary rehab.  She is interested in clinical research trial.  She has been contacted by [employer] for a phase 2 study called [employer] there is a few months duration.  She asked again if she would  benefit from the study.  I explained to her that the benefit is primarily for society and for drug development.  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  Explained that in standard of care she is being the physician and healthcare workers.  But in research she is getting paid and this is the difference.  She gets paid a stipend for her effort.  Explained that the second priority is to control her risk.  This is covered and controlled by prior knowledge of the drug which gets updated frequently by investigator brochures and communication from the sponsor.  In the third is therapeutic benefit if she gets randomized to the real drug and if it happens to work of which for an approved drug since high level of uncertainty.  Also explained with a phase 2 study therapeutic benefit is much less.  She is understanding.  She wants to read the paper consent.  She was emailed a consent but she could not printed.  The research c coordinator will give her a paper consent.  She is definitely not interested in nintedanib after her bad experience with pirfenidone.  She had pulmonary function test.  I reviewed and visualized this with her.  It is stable/improved.  Her walking desaturation test is stable.  Chest stable/  SYMPTOM SCALE - ILD 03/16/2019  08/21/2019 Off esbriet since 08/12/19 03/04/2020  Off esbriet. S/p rehab  O2 use ra ra ra  Shortness of Breath 0 -> 5 scale with 5 being worst (score 6 If unable to do)    At rest 0 0 0  Simple tasks - showers, clothes change, eating, shaving 1  1  Household (dishes, doing bed, laundry) 4 Do not notice othe than rushing aorund 1  Shopping 3  2  Walking level  at own pace 2  1  Walking up Stairs _0 Total (30-36) Dyspnea Score 15  7  How bad is your cough? 0 Only very mild in the morning 1  How bad is your fatigue 0 4.5 on esbriet-> 0.5 now 1.5  How bad is nausea 0 5 during esbrit -> 0 now. Was  Getting gagging thinking of food on esbriet 0  How bad  is vomiting?  0 Had some vomit during esbriet -> none since stopping esbriet 0  How bad is diarrhea? 0 0 at all time 0  How bad is anxiety? 1.5 0 0  How bad is depression 0  0        Simple office walk 185 feet x  3 laps goal with forehead probe 10/08/2018  11/07/2018  12/22/2018  03/16/2019  03/04/2020   O2 used ra ra ra - wor mask ra   Number laps completed _1 Comments about pace good fast Mod pace Fast pace with mask fast  Resting Pulse Ox/HR 98% and 77/min 100% and 62/min 97% ad 70.min 99% and 59 min 96% and 80/mn  Final Pulse Ox/HR 95% and 96/min 96% and 105/min 90%and 110/mi 96% and 119 94% and 90/min  Desaturated </= 88% _2   Desaturated <= 3% points yes Yes, 4 ppoints Yes, 7 ponits Yes, 3 points no  Got Tachycardic >/= 90/min yes yes yes    Symptoms at end of test none none none Mild dysnea No dysea  Miscellaneous comments x  ? Mask related worsening       PFT  PFT Results Latest Ref Rng & Units 03/04/2020 09/15/2019 03/16/2019 01/29/2018  FVC-Pre L 3.07 2.92 2.91 2.97  FVC-Predicted Pre % 104 99 97 98  FVC-Post L - - - 2.89  FVC-Predicted Post % - - - 95  Pre FEV1/FVC % % 78 80 79 81  Post FEV1/FCV % % - - - 83  FEV1-Pre L 2.39 2.34 2.29 2.40  FEV1-Predicted Pre % 108 106 102 105  FEV1-Post L - - - 2.39  DLCO uncorrected ml/min/mmHg 12.94 12.84 12.32 14.80  DLCO UNC% % 63 63 60 54  DLCO corrected ml/min/mmHg 12.94 13.26 12.91 -  DLCO COR %Predicted % 63 65 63 -  DLVA Predicted % 70 71 73 68       has a past medical history of Allergic rhinitis, GERD (gastroesophageal reflux disease), Glaucoma, Hiatal hernia, Hyperlipidemia, Hypothyroidism, ILD (interstitial lung disease) ([place]), Lichen planopilaris, MVP (mitral valve prolapse), Restless legs, and Urticaria.   reports that she has never smoked. She has never used smokeless tobacco.  Past Surgical History:  Procedure Laterality Date  . APPENDECTOMY     with TAH  . CARDIAC CATHETERIZATION   1989   negative  . COLONOSCOPY  2005   Dr Olevia Perches  . COLONOSCOPY  01/2013   Dr Collene Mares  . esophageal dilation  07/04/2011   Dr Olevia Perches  . FLEXIBLE SIGMOIDOSCOPY  1999    Dr Olevia Perches  . KNEE ARTHROSCOPY  2001, 2005   Dr Percell Miller   bilat  . TOTAL ABDOMINAL HYSTERECTOMY     for Endometriosis; no BSO, Dr Colin Ina  . UPPER GI ENDOSCOPY  01/2013   Dr Collene Mares  . VIDEO BRONCHOSCOPY Bilateral 02/05/2019   Procedure: VIDEO BRONCHOSCOPY WITH FLUORO;  Surgeon: Brand Males, MD;  Location[facility] ENDOSCOPY;  Service: Endoscopy;  Laterality: Bilateral;    Allergies  Allergen Reactions  . Caffeine  palpitations  . Chlorpheniramine-Pseudoeph     palpitations  . Maxifed     palpitations  . Other Other (See Comments)  . Pirfenidone Nausea Only    Esbriet Causes nausea and weakness    Immunization History  Administered Date(s) Administered  . Influenza Split 11/07/2010  . Influenza Whole 10/30/2009, 11/27/2011  . Influenza, High Dose Seasonal PF 10/27/2012, 11/05/2013, 10/01/2017, 09/11/2018, 11/23/2019  . Influenza-Unspecified 11/24/2014, 10/07/2015, 10/08/2016  . PFIZER(Purple Top)SARS-COV-2 Vaccination 03/14/2019, 04/06/2019, 10/27/2019  . Pneumococcal Conjugate-13 10/07/2015  . Pneumococcal Polysaccharide-23 07/02/2017  . Tdap 02/11/2012  . Zoster 01/09/2012  . Zoster Recombinat (Shingrix) 10/01/2017, 12/03/2017    Family History  Problem Relation Age of Onset  . Lung cancer Father        Asbestos with mesothelioma  . Heart disease Paternal Grandmother        ? etiology  . Diabetes Paternal Grandmother   . Diabetes Brother   . Uterine cancer Paternal Aunt   . Stroke Paternal Grandfather        > 37  . Heart attack Brother 42  . Allergies Sister   . Aneurysm Mother        congenital  . Stroke Brother        two strokes  . Barrett's esophagus Brother      Current Outpatient Medications:  .  aspirin EC 81 MG tablet, Take 1 tablet (81 mg total) by mouth daily., Disp: 90  tablet, Rfl: 3 .  betamethasone, augmented, (DIPROLENE) 0.05 % lotion, Apply 1 application topically 2 (two) times daily., Disp: , Rfl:  .  Biotin 5000 MCG TABS, Take 5,000 mcg by mouth daily., Disp: , Rfl:  .  cetirizine (ZYRTEC) 10 MG tablet, Take 10 mg by mouth daily. , Disp: , Rfl:  .  cholecalciferol (VITAMIN D3) 25 MCG (1000 UT) tablet, Take 1,000 Units by mouth daily., Disp: , Rfl:  .  esomeprazole (NEXIUM) 40 MG capsule, Take 40 mg by mouth every morning., Disp: , Rfl:  .  famotidine (PEPCID) 40 MG tablet, Take 1 tablet (40 mg total) by mouth daily., Disp: 90 tablet, Rfl: 1 .  fluticasone (FLONASE) 50 MCG/ACT nasal spray, Place 1 spray into both nostrils daily as needed for allergies or rhinitis., Disp: , Rfl:  .  hydroxychloroquine (PLAQUENIL) 200 MG tablet, Take 200 mg by mouth 2 (two) times daily., Disp: , Rfl:  .  hydrOXYzine (ATARAX/VISTARIL) 25 MG tablet, Take 25 mg by mouth 3 (three) times daily. , Disp: , Rfl:  .  levothyroxine (SYNTHROID) 100 MCG tablet, Take 1 tablet (100 mcg total) by mouth daily., Disp: 90 tablet, Rfl: 3 .  magnesium oxide (MAG-OX) 400 MG tablet, Take 400 mg by mouth daily., Disp: , Rfl:  .  Multiple Vitamins-Minerals (ZINC PO), Take 1 tablet by mouth., Disp: , Rfl:  .  pravastatin (PRAVACHOL) 40 MG tablet, TAKE 1 TABLET BY MOUTH  DAILY, Disp: 90 tablet, Rfl: 3 .  rOPINIRole (REQUIP) 0.5 MG tablet, TAKE 2 TABLETS BY MOUTH  AFTER DINNER EACH NIGHT., Disp: 180 tablet, Rfl: 3 .  vitamin B-12 (CYANOCOBALAMIN) 500 MCG tablet, Take 500 mcg by mouth daily., Disp: , Rfl:       Objective:   Vitals:   03/04/20 1343  BP: 122/72  Pulse: 80  Temp: 98 F (36.7 C)  TempSrc: Oral  SpO2: 96%  Weight: 144 lb (65.3 kg)  Height: _0  (1.676 m)    Estimated body mass index is 23.24 kg/m as calculated from  the following:   Height as of this encounter: _0  (1.676 m).   Weight as of this encounter: 144 lb (65.3 kg).  _1 @  Filed Weights   03/04/20  1343  Weight: 144 lb (65.3 kg)     Physical Exam   General: No distress. Looks w3ll Neuro: Alert and Oriented x 3. GCS 15. Speech normal Psych: Pleasant Resp:  Barrel Chest - no.  Wheeze - no, Crackles -yes of the lung base, No overt respiratory distress CVS: Normal heart sounds. Murmurs - no Ext: Stigmata of Connective Tissue Disease - no HEENT: Normal upper airway. PEERL +. No post nasal drip        Assessment:       ICD-10-CM   1. IPF (idiopathic pulmonary fibrosis) ([place])  S93.734        Plan:     Patient Instructions     ICD-10-CM   1. IPF (idiopathic pulmonary fibrosis) (H[facility])  J84.112     Stable disease but subjectively better after stopping pirfenidone and completing pulmonary rehabilitation  Respect your desire not to try the approved antifibrotic nintedanib due to previous significant side effects with pirfenidone  Plan - meet Reba Morris from [employer] and get research consent for [employer]li[employer] study -do standard of care spirometry and DLCO in 6 months -Continue to keep yourself active and avoid getting respiratory illnesses  Follow-up -Return to see Dr. Chase Caller in a 30-minute visit in 6 months  -ILD symptom score and simple walking desaturation test at follow-up     SIGNATURE    Dr. Brand Males, M.D., F.C.C.P,  Pulmonary and Critical Care Medicine Staff Physician, [place] Director - Interstitial Lung Disease  Program  Pulmonary [place] at [place], [place], [address]  Pager: [phone], If no answer or between  15:00h - 7:00h: call [phone] Telephone: [phone]  2:13 PM 03/04/2020

## 2020-03-07 ENCOUNTER — Telehealth: Payer: Self-pay | Admitting: Pharmacist

## 2020-03-07 NOTE — Chronic Care Management (AMB) (Signed)
Chronic Care Management Pharmacy Assistant   Name: Elizabeth Espinoza  MRN: [id number] DOB: [birth year]  Reason for Encounter: Disease State/ General Adherence Call  PCP : Elizabeth Rail, MD  Allergies:   Allergies  Allergen Reactions  . Caffeine     palpitations  . Chlorpheniramine-Pseudoeph     palpitations  . Maxifed     palpitations  . Other Other (See Comments)  . Pirfenidone Nausea Only    Esbriet Causes nausea and weakness    Medications: Outpatient Encounter Medications as of 03/07/2020  Medication Sig  . aspirin EC 81 MG tablet Take 1 tablet (81 mg total) by mouth daily.  . betamethasone, augmented, (DIPROLENE) 0.05 % lotion Apply 1 application topically 2 (two) times daily.  . Biotin 5000 MCG TABS Take 5,000 mcg by mouth daily.  . cetirizine (ZYRTEC) 10 MG tablet Take 10 mg by mouth daily.   . cholecalciferol (VITAMIN D3) 25 MCG (1000 UT) tablet Take 1,000 Units by mouth daily.  Marland Kitchen esomeprazole (NEXIUM) 40 MG capsule Take 40 mg by mouth every morning.  . famotidine (PEPCID) 40 MG tablet Take 1 tablet (40 mg total) by mouth daily.  . fluticasone (FLONASE) 50 MCG/ACT nasal spray Place 1 spray into both nostrils daily as needed for allergies or rhinitis.  . hydroxychloroquine (PLAQUENIL) 200 MG tablet Take 200 mg by mouth 2 (two) times daily.  . hydrOXYzine (ATARAX/VISTARIL) 25 MG tablet Take 25 mg by mouth 3 (three) times daily.   Marland Kitchen levothyroxine (SYNTHROID) 100 MCG tablet Take 1 tablet (100 mcg total) by mouth daily.  . magnesium oxide (MAG-OX) 400 MG tablet Take 400 mg by mouth daily.  . Multiple Vitamins-Minerals (ZINC PO) Take 1 tablet by mouth.  . pravastatin (PRAVACHOL) 40 MG tablet TAKE 1 TABLET BY MOUTH  DAILY  . rOPINIRole (REQUIP) 0.5 MG tablet TAKE 2 TABLETS BY MOUTH  AFTER DINNER EACH NIGHT.  Marland Kitchen vitamin B-12 (CYANOCOBALAMIN) 500 MCG tablet Take 500 mcg by mouth daily.  . [DISCONTINUED] loratadine (CLARITIN) 10 MG tablet Take 10 mg by mouth daily as needed.     . [DISCONTINUED] omeprazole (PRILOSEC) 40 MG capsule Take 1 capsule (40 mg total) by mouth daily.   No facility-administered encounter medications on file as of 03/07/2020.    Current Diagnosis: Patient Active Problem List   Diagnosis Date Noted  . 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 Bilateral shoulder pain 11/18/2018  . Bony sclerosis 10/21/2018  . Chronic cough 01/29/2018  . Rash and nonspecific skin eruption 07/30/2016  . Prediabetes 02/24/2015  . Dyspnea on exertion 09/29/2014  . ILD (interstitial lung disease) ([place]) 12/11/2012  . Barrett's esophagus 12/11/2012  . RLS (restless legs syndrome) 04/13/2011  . ARTHRALGIA 09/01/2008  . HIATAL HERNIA 07/31/2007  . History of cardiovascular disorder 07/31/2007  . Allergic rhinitis 06/19/2007  . GASTROESOPHAGEAL REFLUX DISEASE, CHRONIC 06/19/2007  . Hypothyroidism 02/07/2007  . HYPERLIPIDEMIA 02/07/2007    Have you seen any other providers since your last visit with Elizabeth Espinoza, CPP?  03/04/2020 (pulmonology) Dr. Chase Espinoza; IPF (idiopathic pulmonary fibrosis, stable disease but subjectively better after stopping pirfenidone and completing pulmonary rehabilitation.   Have you had any problems recently with your health?  Patient states she has not had any problems recently with her health.  Have you had any problems with your pharmacy?  Patient states she has not had any problems recently with her pharmacy.  What issues or side effects are you having with your medications?  Patient states she  is not currently having any issues or side effects from any of her medications.  What would you like me to pass along to [facility], CPP for her to help you with?   Patient states she doesn't have anything for me to pass along at this time.  What can we do to take care of you better?  Patient states she is doing well at this time.  Patient states she has been breathing well/better since completing pulmonary rehab.  Elizabeth  D Espinoza, [place] Pharmacist Assistant [phone]    Follow-Up:  Pharmacist Review

## 2020-03-16 ENCOUNTER — Encounter: Payer: Medicare Other | Admitting: *Deleted

## 2020-03-16 ENCOUNTER — Other Ambulatory Visit: Payer: Self-pay | Admitting: Internal Medicine

## 2020-03-16 ENCOUNTER — Other Ambulatory Visit: Payer: Self-pay

## 2020-03-16 ENCOUNTER — Encounter (INDEPENDENT_AMBULATORY_CARE_PROVIDER_SITE_OTHER): Payer: Medicare Other | Admitting: Internal Medicine

## 2020-03-16 DIAGNOSIS — Z006 Encounter for examination for normal comparison and control in clinical research program: Secondary | ICD-10-CM

## 2020-03-16 DIAGNOSIS — J84112 Idiopathic pulmonary fibrosis: Secondary | ICD-10-CM

## 2020-03-16 NOTE — Research (Signed)
Title: A randomized, double-blind, multicenter, dose-ranging, placebo-controlled Phase 2a evaluation of the safety, tolerability and pharmacokinetics of [id number] in participants with idiopathic pulmonary fibrosis (IPF)   Patients will be randomized in a 3:1 ratio to one of two treatment arms:  320 mg [id number] or placebo.   Protocol #: [id number], Clinical Trials EUDRACT# [id number]  ** Sponsor [employer] www.[email or web address] [address])   Protocol Version Amendment 3 (ver.3.0) for 03/16/2020 Date of 19OCT2021  (double checked yes) Consent Version 5.1 dated 11Jan2022, approved  for 03/16/2020 date of  (double checked yes) Investigator Brochure Version 5.0 for 03/16/2020 date of 19OCT2021   Key Inclusion Criteria:   Age = 80 years old Confident diagnosis of IPF  up to 5 years. If IPF diagnosis is within > 3 to = 5 years at screening, the participant must have evidence of progression within the last 24 months, as defined by decline in FVC percent predicted based on a relative decline of = 5% FVC% pred >=45%  DLCO (hgb-adjusted) >=30% Treatment with nintedanib or pirfenidone allowed, But must have stable dose =3 months before Screening Visit and remain unchanged during the study   Key Exclusion Criteria:   Receiving any non-approved agent intended for treatment of fibrosis in IPF FEV1 over the FVC ratio < 0.7 at screening Active infection,  that can affect FVC measurement during Screening or  Randomization Extent of emphysema > fibrotic changes on most recent HRCT IPF exacerbation within 6 months of Screening  Severe pulmonary hypertension Smoking of any kind w/in 3 months of Screening or unwilling to avoid smoking throughout the study History of unstable /deteriorating cardiac/pulmonary disease (other than IPF) w/in 6 months of Screening Any ongoing known malignancy, except for localized cancers (i.e. basal cell carcinoma) Medical or surgical condition  known to affect drug absorption Surgeries scheduled during the study period    Side-Effects: To date, [id number] has been given to 184 healthy participants in 6 ongoing/completed clinical studies. The major side-effects reported have been headache (mild) and constipation (mild) in 3-76% of subjects. Other side effects, in 1-2% of subjects, were mild to moderate, with fever being the only severe side-effect reported.  Mechanism: Selective dual aV6 and aV1 integrin inhibitor with antifibrotic activity. [id number] prevents aV6 and aV1 from binding to the RGD sequence of latency-associated peptide (LAP), this blocks the release of activated TGF-1, thereby blocking the activation of pro-fibrotic pathways. Additionally, [id number] has been shown to reduce collagen synthesis and gene expression in human IPF tissue.   Interactions: Acts as a substrate of P-gp, OATP1B1, OATP1B3, OAT3 Inhibitors or inducers of these enzymes may affect the intestinal absorption, hepatic uptake or renal uptake of [id number] Metabolized by CYP3A4, CYP2C9, CYP2C19 Potent inhibitors or inducers of these enzymes should be avoided Each CYP enzyme metabolizes < 10% of the parent compound Since multiple CYP enzymes are involved, clinically relevant drug-drug interactions are less likely, as there are other alternative pathways A potent inhibitor of multiple pathways should be used with caution as that has the potential to elicit a clinically relevant drug-drug interaction i.e. medication that inhibits CYP3A4, CYP2C9, CYP2C19 [id number] is not an inducer of CYP1A2, CYP2B6, or CYP3A4 Showed moderate inhibition of P-gp and MATE1, substrates of P-gp and MATE1 may  have increased exposure when coadministered with [id number] Metabolized by extrahepatic enzymes: CYP1A1, UGT1A7 Clinical relevance relating to drug interactions is unknown Stable when incubated with human recombinant MAO-A and MAO-B [id number] is not an inhibitor or  inducer of CYP isoforms and  may be co-administered with substrates of CYP isoforms without risk of DDI.  Interactions with specific drugs: Potential for increased exposure of nintedanib Recommend separating time of administration of [id number] and nintedanib Drug interactions with pirfenidone are not expected Use of corticosteroids should be discussed beforehand with the medical monitor   Clinical drug interactions studies have not been performed at this time and therefore guidance is based on the in-vitro studies  Disallowed Medications/Foods/Supplements: Fluconazole Digoxin Rifampin Grapefruit Grapefruit-containing foods and beverages St. John's Wort   Pharmacokinetics: Tmax : 2-3 hours Half-life: approximately 48 hours Steady state reached in 5-7 days Minimal renal clearance (8.5% of drug is cleared by the kidneys)    Administration [phone] should be administered on an empty stomach May be administered via feeding tube   Safety:   Edition 4.0  23 December 2018   Animal studies: No effect on neurobehavior No effect on respiratory parameters No effects on blood pressure Showed an increase in QT and QTc The high dose 400 mg/kg showed an increase in mean heart rate   Cardiovascular effects in mice after administration of [id number]: Dose-dependent increases in blood pressure Increase in PR and QRS duration Increase in arrhythmias  Reproductive animal toxicology studies at the low and medium doses, 150 and 300 mg/kg/day, revealed no impact on: maternal survival, fetal body weight, food parameters, uterine and ovarian parameters, no external malformations  When the high dose 600 mg/kg/day was tested in rabbits, the rabbits displayed increased morbidities and increased abortions   Phase 1 studies:   Type of TEAE [id number] n=32 Placebo n=14 GI disorders 7 (22%) 0 Infections 3 (9%) 0 Musculoskeletal 1 (3%) 0 Nervous system disorders 2 (6%) 0   Most common  treatment-emergent adverse event (TEAE):  headache  The most common drug-related adverse events were: nausea, diarrhea, abdominal distension. These occurred in 12.5% of participants  There were no deaths or serious adverse events in any of the Phase I studies There were no clinically meaningful changes in vital signs, telemetry or laboratory parameters [phone] did not display any clinically significant effect on ECG (iIncluding Qtc, HR, RR, PR and QRS)  Study [id number] All TEAEs were mild and deemed unrelated to study drug One TEAE, viral infection, led to discontinuation of IP No deaths nor SAEs  At the time of protocol finalization, safety and tolerability data from ongoing Phase 2 studies suggest no new safety concerns.  Part D of the study will evaluate approximately 28 participants at the 320 mg dose for at least 24 weeks to further characterize the safety profile of [id number]. Ongoing safety and FVC assessments will continue until the last participant reaches 24 weeks of the study; therefore, safety and FVC assessments may extend to 48 weeks in participants randomized earlier in the study.  Clinical Research Coordinator / Research RN note : This visit for Subject Elizabeth Espinoza with DOB: 23-Aug-1940 on 03/16/2020 for the above protocol is Visit #1  and is for purpose of research.   The consent for this encounter is under Protocol Version 3.0, Investigator Brochure Edition 5, Consent Version 5.1 and  is currently IRB approved.   Subject expressed continued interest and consent in continuing as a study subject. Subject confirmed that there was no change in contact information (e.g. address, telephone, email). Subject thanked for participation in research and contribution to science.  In this visit 03/16/2020 the subject will be evaluated by investigator named Dr. Chase Caller  . This research coordinator has verified that the investigator is up to  date with his training logs.    Because this visit is a key visit of screening, this visit is under direct supervision of the PI Dr. Chase Caller . This PI is available for this visit.  The subject was allowed ample time to review the informed consent and all questions were answered. Subject signed consent for the study.  No study related procedures were performed prior to consent being signed. Please see Informed Consent Documentation Checklist in the subject's binder for details of the consent process. The PI was present for the consent process. After signing consent, all study visit 1 procedures and assessments were completed per the above mentioned protocol. Refer to the subject's study binder for further details of the visit. She will return within the next 27 days for study visit 2 if all criteria for inclusion in the study are met after screening assessments are completed and evaluated.  Signed by Hale Drone, MS, [place] / Nurse [place], [place] 5:19 PM 03/16/2020

## 2020-03-23 ENCOUNTER — Ambulatory Visit (INDEPENDENT_AMBULATORY_CARE_PROVIDER_SITE_OTHER)
Admission: RE | Admit: 2020-03-23 | Discharge: 2020-03-23 | Disposition: A | Discharge status: Home / Self Care | Payer: Self-pay | Source: Ambulatory Visit | Attending: Internal Medicine | Admitting: Internal Medicine

## 2020-03-23 ENCOUNTER — Other Ambulatory Visit: Payer: Self-pay

## 2020-03-23 DIAGNOSIS — J479 Bronchiectasis, uncomplicated: Secondary | ICD-10-CM | POA: Diagnosis not present

## 2020-03-23 DIAGNOSIS — I700 Atherosclerosis of aorta: Secondary | ICD-10-CM | POA: Diagnosis not present

## 2020-03-23 DIAGNOSIS — J84112 Idiopathic pulmonary fibrosis: Secondary | ICD-10-CM

## 2020-03-23 DIAGNOSIS — Z006 Encounter for examination for normal comparison and control in clinical research program: Secondary | ICD-10-CM

## 2020-03-23 DIAGNOSIS — I2510 Atherosclerotic heart disease of native coronary artery without angina pectoris: Secondary | ICD-10-CM | POA: Diagnosis not present

## 2020-03-23 DIAGNOSIS — R590 Localized enlarged lymph nodes: Secondary | ICD-10-CM | POA: Diagnosis not present

## 2020-04-11 ENCOUNTER — Encounter (INDEPENDENT_AMBULATORY_CARE_PROVIDER_SITE_OTHER): Payer: Medicare Other | Admitting: Adult Health

## 2020-04-11 ENCOUNTER — Encounter: Payer: Self-pay | Admitting: Adult Health

## 2020-04-11 ENCOUNTER — Encounter: Payer: Medicare Other | Admitting: *Deleted

## 2020-04-11 ENCOUNTER — Other Ambulatory Visit: Payer: Self-pay

## 2020-04-11 DIAGNOSIS — Z006 Encounter for examination for normal comparison and control in clinical research program: Secondary | ICD-10-CM

## 2020-04-11 DIAGNOSIS — J84112 Idiopathic pulmonary fibrosis: Secondary | ICD-10-CM

## 2020-04-11 DIAGNOSIS — J849 Interstitial pulmonary disease, unspecified: Secondary | ICD-10-CM

## 2020-04-11 NOTE — Assessment & Plan Note (Signed)
Continue on current regimen

## 2020-04-11 NOTE — Assessment & Plan Note (Signed)
Patient with active infection symptoms (exclusion criteria) , will not be able to proceed in research study at this time.  PI Dr. Chase Caller notified .  Discussion with subject in detail.

## 2020-04-11 NOTE — Progress Notes (Signed)
_0  ID: Elizabeth Espinoza, female    DOB: Feb 26, 1940, 80 y.o.   MRN: [id number]    Referring provider: Binnie Rail, MD  HPI: Title: A randomized, double-blind, multicenter, dose-ranging, placebo-controlled Phase 2a evaluation of the safety, tolerability and pharmacokinetics of [phone] in participants with idiopathic pulmonary fibrosis (IPF)   Patients will be randomized in a 3:1 ratio to one of two treatment arms:  320 mg [id number] or placebo.   Protocol #: [id number], Clinical Trials EUDRACT# [id number]  ** Sponsor [employer] [email or web address]   ([place] [address])   Protocol Version Amendment 3 (ver.3.0) for 03/16/2020 Date of 19OCT2021  (double checked yes) Consent Version 5.1 dated 11Jan2022, approved  for 03/16/2020 date of  (double checked yes) Investigator Brochure Version 5.0 for 03/16/2020 date of 19OCT2021   Key Inclusion Criteria:   Age = 80 years old Confident diagnosis of IPF  up to 5 years. If IPF diagnosis is within > 3 to = 5 years at screening, the participant must have evidence of progression within the last 24 months, as defined by decline in FVC percent predicted based on a relative decline of = 5% FVC% pred >=45%  DLCO (hgb-adjusted) >=30% Treatment with nintedanib or pirfenidone allowed, But must have stable dose =3 months before Screening Visit and remain unchanged during the study   Key Exclusion Criteria:   Receiving any non-approved agent intended for treatment of fibrosis in IPF FEV1 over the FVC ratio < 0.7 at screening Active infection,  that can affect FVC measurement during Screening or  Randomization Extent of emphysema > fibrotic changes on most recent HRCT IPF exacerbation within 6 months of Screening  Severe pulmonary hypertension Smoking of any kind w/in 3 months of Screening or unwilling to avoid smoking throughout the study History of unstable /deteriorating cardiac/pulmonary disease (other than IPF)  w/in 6 months of Screening Any ongoing known malignancy, except for localized cancers (i.e. basal cell carcinoma) Medical or surgical condition known to affect drug absorption Surgeries scheduled during the study period    Side-Effects: To date, [id number] has been given to 184 healthy participants in 56 ongoing/completed clinical studies. The major side-effects reported have been headache (mild) and constipation (mild) in 3-76% of subjects. Other side effects, in 1-2% of subjects, were mild to moderate, with fever being the only severe side-effect reported.  Mechanism: Selective dual aV6 and aV1 integrin inhibitor with antifibrotic activity. [id number] prevents aV6 and aV1 from binding to the RGD sequence of latency-associated peptide (LAP), this blocks the release of activated TGF-1, thereby blocking the activation of pro-fibrotic pathways. Additionally, [id number] has been shown to reduce collagen synthesis and gene expression in human IPF tissue.   Interactions: Acts as a substrate of P-gp, OATP1B1, OATP1B3, OAT3 Inhibitors or inducers of these enzymes may affect the intestinal absorption, hepatic uptake or renal uptake of [id number] Metabolized by CYP3A4, CYP2C9, CYP2C19 Potent inhibitors or inducers of these enzymes should be avoided Each CYP enzyme metabolizes < 10% of the parent compound Since multiple CYP enzymes are involved, clinically relevant drug-drug interactions are less likely, as there are other alternative pathways A potent inhibitor of multiple pathways should be used with caution as that has the potential to elicit a clinically relevant drug-drug interaction i.e. medication that inhibits CYP3A4, CYP2C9, CYP2C19 [id number] is not an inducer of CYP1A2, CYP2B6, or CYP3A4 Showed moderate inhibition of P-gp and MATE1, substrates of P-gp and MATE1 may  have increased exposure when coadministered with [id number] Metabolized by extrahepatic enzymes: CYP1A1, UGT1A7  Clinical  relevance relating to drug interactions is unknown Stable when incubated with human recombinant MAO-A and MAO-B [id number] is not an inhibitor or inducer of CYP isoforms and may be co-administered with substrates of CYP isoforms without risk of DDI.  Interactions with specific drugs: Potential for increased exposure of nintedanib Recommend separating time of administration of [id number] and nintedanib Drug interactions with pirfenidone are not expected Use of corticosteroids should be discussed beforehand with the medical monitor   Clinical drug interactions studies have not been performed at this time and therefore guidance is based on the in-vitro studies  Disallowed Medications/Foods/Supplements: Fluconazole Digoxin Rifampin Grapefruit Grapefruit-containing foods and beverages St. John's Wort   Pharmacokinetics: Tmax : 2-3 hours Half-life: approximately 48 hours Steady state reached in 5-7 days Minimal renal clearance (8.5% of drug is cleared by the kidneys)    Administration [phone] should be administered on an empty stomach May be administered via feeding tube   Safety:   Edition 4.0  23 December 2018   Animal studies: No effect on neurobehavior No effect on respiratory parameters No effects on blood pressure Showed an increase in QT and QTc The high dose 400 mg/kg showed an increase in mean heart rate   Cardiovascular effects in mice after administration of [id number]: Dose-dependent increases in blood pressure Increase in PR and QRS duration Increase in arrhythmias  Reproductive animal toxicology studies at the low and medium doses, 150 and 300 mg/kg/day, revealed no impact on: maternal survival, fetal body weight, food parameters, uterine and ovarian parameters, no external malformations  When the high dose 600 mg/kg/day was tested in rabbits, the rabbits displayed increased morbidities and increased abortions   Phase 1 studies:   Type of TEAE [id number]  n=32 Placebo n=14 GI disorders 7 (22%) 0 Infections 3 (9%) 0 Musculoskeletal 1 (3%) 0 Nervous system disorders 2 (6%) 0   Most common treatment-emergent adverse event (TEAE):  headache  The most common drug-related adverse events were: nausea, diarrhea, abdominal distension. These occurred in 12.5% of participants  There were no deaths or serious adverse events in any of the Phase I studies There were no clinically meaningful changes in vital signs, telemetry or laboratory parameters [id number] did not display any clinically significant effect on ECG (iIncluding Qtc, HR, RR, PR and QRS)  Study [id number] All TEAEs were mild and deemed unrelated to study drug One TEAE, viral infection, led to discontinuation of IP No deaths nor SAEs  At the time of protocol finalization, safety and tolerability data from ongoing Phase 2 studies suggest no new safety concerns.  Part D of the study will evaluate approximately 28 participants at the 320 mg dose for at least 24 weeks to further characterize the safety profile of [id number]. Ongoing safety and FVC assessments will continue until the last participant reaches 24 weeks of the study; therefore, safety and FVC assessments may extend to 48 weeks in participants randomized earlier in the study.  04/11/2020 Research Visit #2  Subject Elizabeth Espinoza with DOB 08/07/1940 on 04/11/20 for the above protocol is Visit # 2 and is for purpose of research . Inclusion and exclusion criteria were reviewed. Patient complains over last 3-4 days of cough , congestion, nasal drainage and sinus pressure . Has seen some intermittent green mucus. Due to the potential for active infection she will fail to proceed further in the study . PI was notified . Discussed in detail with patient.Subject thanked for her participation in science.   Advised to follow up with  PCP for respiratory symptoms .   Allergies  Allergen Reactions  . Caffeine      palpitations  . Chlorpheniramine-Pseudoeph     palpitations  . Maxifed     palpitations  . Other Other (See Comments)  . Pirfenidone Nausea Only    Esbriet Causes nausea and weakness    Immunization History  Administered Date(s) Administered  . Influenza Split 11/07/2010  . Influenza Whole 10/30/2009, 11/27/2011  . Influenza, High Dose Seasonal PF 10/27/2012, 11/05/2013, 10/01/2017, 09/11/2018, 11/23/2019  . Influenza-Unspecified 11/24/2014, 10/07/2015, 10/08/2016  . PFIZER(Purple Top)SARS-COV-2 Vaccination 03/14/2019, 04/06/2019, 10/27/2019  . Pneumococcal Conjugate-13 10/07/2015  . Pneumococcal Polysaccharide-23 07/02/2017  . Tdap 02/11/2012  . Zoster 01/09/2012  . Zoster Recombinat (Shingrix) 10/01/2017, 12/03/2017    Past Medical History:  Diagnosis Date  . Allergic rhinitis   . GERD (gastroesophageal reflux disease)   . Glaucoma    SUSPECT  . Hiatal hernia   . Hyperlipidemia    LDL goal = < 100  . Hypothyroidism   . ILD (interstitial lung disease) ([place])   . Lichen planopilaris   . MVP (mitral valve prolapse)    documented on 2 D ECHO  . Restless legs   . Urticaria     Tobacco History: Social History   Tobacco Use  Smoking Status Never Smoker  Smokeless Tobacco Never Used   Counseling given: Not Answered   Outpatient Medications Prior to Visit  Medication Sig Dispense Refill  . aspirin EC 81 MG tablet Take 1 tablet (81 mg total) by mouth daily. 90 tablet 3  . betamethasone, augmented, (DIPROLENE) 0.05 % lotion Apply 1 application topically 2 (two) times daily.    . Biotin 5000 MCG TABS Take 5,000 mcg by mouth daily.    . cetirizine (ZYRTEC) 10 MG tablet Take 10 mg by mouth daily.     . cholecalciferol (VITAMIN D3) 25 MCG (1000 UT) tablet Take 1,000 Units by mouth daily.    Marland Kitchen esomeprazole (NEXIUM) 40 MG capsule Take 40 mg by mouth every morning.    . famotidine (PEPCID) 40 MG tablet Take 1 tablet (40 mg total) by mouth daily. 90 tablet 1  . fluticasone  (FLONASE) 50 MCG/ACT nasal spray Place 1 spray into both nostrils daily as needed for allergies or rhinitis.    . hydroxychloroquine (PLAQUENIL) 200 MG tablet Take 200 mg by mouth 2 (two) times daily.    . hydrOXYzine (ATARAX/VISTARIL) 25 MG tablet Take 25 mg by mouth 3 (three) times daily.     Marland Kitchen levothyroxine (SYNTHROID) 100 MCG tablet Take 1 tablet (100 mcg total) by mouth daily. 90 tablet 3  . magnesium oxide (MAG-OX) 400 MG tablet Take 400 mg by mouth daily.    . Multiple Vitamins-Minerals (ZINC PO) Take 1 tablet by mouth.    . pravastatin (PRAVACHOL) 40 MG tablet TAKE 1 TABLET BY MOUTH  DAILY 90 tablet 3  . rOPINIRole (REQUIP) 0.5 MG tablet TAKE 2 TABLETS BY MOUTH  AFTER DINNER EACH NIGHT. 180 tablet 3  . vitamin B-12 (CYANOCOBALAMIN) 500 MCG tablet Take 500 mcg by mouth daily.     No facility-administered medications prior to visit.     Physical Exam  See Research exam .    Lab Results:  BMET  BNP No results found for: BNP  ProBNP No results found for: PROBNP  Imaging: CT Chest High Resolution  Result Date: 03/24/2020 CLINICAL DATA:  80 year old female with history of 39 pathic pulmonary fibrosis. Research examination. EXAM: CT CHEST WITHOUT CONTRAST  TECHNIQUE: Multidetector CT imaging of the chest was performed following the standard protocol without intravenous contrast. High resolution imaging of the lungs, as well as inspiratory and expiratory imaging, was performed. COMPARISON:  High-resolution chest CT 02/04/2020. FINDINGS: Cardiovascular: Heart size is normal. There is no significant pericardial fluid, thickening or pericardial calcification. There is aortic atherosclerosis, as well as atherosclerosis of the great vessels of the mediastinum and the coronary arteries, including calcified atherosclerotic plaque in the left main, left anterior descending, left circumflex and right coronary arteries. Mediastinum/Nodes: No pathologically enlarged mediastinal or hilar lymph  nodes. Please note that accurate exclusion of hilar adenopathy is limited on noncontrast CT scans. Esophagus is unremarkable in appearance. No axillary lymphadenopathy. Lungs/Pleura: High-resolution images again demonstrate scattered patchy areas of peripheral predominant septal thickening, subpleural reticulation and mild cylindrical bronchiectasis with some peripheral bronchiolectasis. No frank honeycombing is identified. No craniocaudal gradient. No progression of disease compared to the recent prior examination. No acute consolidative airspace disease. No pleural effusions. No definite suspicious appearing pulmonary nodules or masses are noted. Upper Abdomen: Aortic atherosclerosis. Musculoskeletal: Diffuse sclerosis throughout the visualized axial and appendicular skeleton, unchanged. There are no aggressive appearing lytic or blastic lesions noted in the visualized portions of the skeleton. IMPRESSION: 1. The appearance of the lungs is unchanged, once again categorized as most compatible with an alternative diagnosis (not usual interstitial pneumonia) per current ATS guidelines. 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 2. Aortic atherosclerosis, in addition to left main and 3 vessel coronary artery disease. Please note that although the presence of coronary artery calcium documents the presence of coronary artery disease, the severity of this disease and any potential stenosis cannot be assessed on this non-gated CT examination. Assessment for potential risk factor modification, dietary therapy or pharmacologic therapy may be warranted, if clinically indicated. Aortic Atherosclerosis (ICD10-I70.0). Electronically Signed   By: Vinnie Langton M.D.   On: 03/24/2020 08:26      PFT Results Latest Ref Rng & Units 03/04/2020 09/15/2019 03/16/2019 01/29/2018  FVC-Pre L 3.07 2.92 2.91 2.97  FVC-Predicted Pre % 104 99 97 98  FVC-Post L - - - 2.89   FVC-Predicted Post % - - - 95  Pre FEV1/FVC % % 78 80 79 81  Post FEV1/FCV % % - - - 83  FEV1-Pre L 2.39 2.34 2.29 2.40  FEV1-Predicted Pre % 108 106 102 105  FEV1-Post L - - - 2.39  DLCO uncorrected ml/min/mmHg 12.94 12.84 12.32 14.80  DLCO UNC% % 63 63 60 54  DLCO corrected ml/min/mmHg 12.94 13.26 12.91 -  DLCO COR %Predicted % 63 65 63 -  DLVA Predicted % 70 71 73 68    No results found for: NITRICOXIDE      Assessment & Plan:   No problem-specific Assessment & Plan notes found for this encounter.     Rexene Edison, NP 04/11/2020

## 2020-04-11 NOTE — Progress Notes (Signed)
Virtual Visit via Video Note  I connected with Elizabeth Espinoza on 04/11/20 at  9:15 AM EDT by a video enabled telemedicine application and verified that I am speaking with the correct person using two identifiers.   I discussed the limitations of evaluation and management by telemedicine and the availability of in person appointments. The patient expressed understanding and agreed to proceed.  Present for the visit:  Myself, Dr Billey Gosling, Josefine Class.  The patient is currently at home and I am in the office.    No referring provider.    History of Present Illness: This is an acute visit for headache, congestion, cough.  She thinks she has allergies and initially thought this was all allergies.  She did have a Covid test and it was negative.  Her symptoms have gotten a little worse and she is unsure if it is an infection or just allergies.  She is taking Zyrtec and Mucinex.  She states nasal congestion, discolored nasal mucus with occasional blood, sinus pain, and irritated throat, cough and headaches.  She has shortness of breath, but that is chronic and unchanged.  She denies any fevers.   Review of Systems  Constitutional: Negative for chills and fever.  HENT: Positive for congestion (PND, discolored nasal mucus, occ blood), sinus pain and sore throat (irritated). Negative for ear pain.   Respiratory: Positive for cough and shortness of breath (chronic). Negative for wheezing.   Neurological: Positive for headaches. Negative for dizziness.      Social History   Socioeconomic History  . Marital status: Married    Spouse name: Jeneen Rinks  . Number of children: 2  . Years of education: Not on file  . Highest education level: Not on file  Occupational History  . Occupation: retired from [employer] retired  Tobacco Use  . Smoking status: Never Smoker  . Smokeless tobacco: Never Used  Vaping Use  . Vaping Use: Never used  Substance and Sexual Activity  . Alcohol use: Yes     Comment:  rarely  . Drug use: No  . Sexual activity: Yes  Other Topics Concern  . Not on file  Social History Narrative  . Not on file   Social Determinants of Health   Financial Resource Strain: Not on file  Food Insecurity: Not on file  Transportation Needs: Not on file  Physical Activity: Not on file  Stress: Not on file  Social Connections: Not on file     Observations/Objective: Appears well in NAD Breathing normally   Assessment and Plan:  See Problem List for Assessment and Plan of chronic medical problems.   Follow Up Instructions:    I discussed the assessment and treatment plan with the patient. The patient was provided an opportunity to ask questions and all were answered. The patient agreed with the plan and demonstrated an understanding of the instructions.   The patient was advised to call back or seek an in-person evaluation if the symptoms worsen or if the condition fails to improve as anticipated.    Binnie Rail, MD

## 2020-04-11 NOTE — Research (Signed)
Title: A randomized, double-blind, multicenter, dose-ranging, placebo-controlled Phase 2a evaluation of the safety, tolerability and pharmacokinetics of [id number] in participants with idiopathic pulmonary fibrosis (IPF)   Patients will be randomized in a 3:1 ratio to one of two treatment arms:  320 mg [id number] or placebo.   Protocol #: [id number], Clinical Trials EUDRACT# 2019-002709-23   [employer] [email or web address]   ([place] [address])   Protocol Version Amendment 3 (ver.3.0) for 04/11/2020 Date of 19OCT2021  (double checked yes) Consent Version 5.1 dated 11Jan2022, approved  for 04/11/2020 date of  (double checked  yes) Investigator Brochure Version 5.0  Addendum for 04/11/2020 date of 21JAN2022   Key Inclusion Criteria:   Age >/= 79 years old Confident diagnosis of IPF  up to 5 years. If IPF diagnosis is within > 3 to = 5 years at screening, the participant must have evidence of progression within the last 24 months, as defined by decline in FVC percent predicted based on a relative decline of = 5% FVC% pred >/=45%  DLCO (hgb-adjusted) >/=30% Treatment with nintedanib or pirfenidone allowed, But must have stable dose =3 months before Screening Visit and remain unchanged during the study   Key Exclusion Criteria:   Receiving any non-approved agent intended for treatment of fibrosis in IPF FEV1 over the FVC ratio < 0.7 at screening Active infection,  that can affect FVC measurement during Screening or  Randomization Extent of emphysema > fibrotic changes on most recent HRCT IPF exacerbation within 6 months of Screening  Severe pulmonary hypertension Smoking of any kind w/in 3 months of Screening or unwilling to avoid smoking throughout the study Renal impairment or end-stage kidney disease requiring dialysis History of unstable /deteriorating cardiac/pulmonary disease (other than IPF) w/in 6 months of Screening Any ongoing known malignancy, except  for localized cancers (i.e. basal cell carcinoma) Medical or surgical condition known to affect drug absorption Surgeries scheduled during the study period    Side-Effects: [id number] was generally well tolerated in over 280 healthy participants with no clinical safety concerns or dose-limiting toxicities observed for single doses up to 668m and multiple doses up to 3262mQD administered for up to 14 days. The major side-effects reported have been headache (mild) and constipation (mild) in 3-76% of subjects. Other side effects, in 1-2% of subjects, were mild to moderate, with fever being the only severe side-effect reported.  Mechanism: Selective dual aV6 and aV1 integrin inhibitor with antifibrotic activity. [id number]-16010revents aV6 and aV1 from binding to the RGD sequence of latency-associated peptide (LAP), this blocks the release of activated TGF-1, thereby blocking the activation of pro-fibrotic pathways. Additionally, PLXNA-35573as been shown to reduce collagen synthesis and gene expression in human IPF tissue.   Interactions: Acts as a substrate of P-gp, OATP1B1, OATP1B3, OAT3 Inhibitors or inducers of these enzymes may affect the intestinal absorption, hepatic uptake or renal uptake of [id number]-25427etabolized by CYP3A4, CYP2C9, CYP2C19 Potent inhibitors or inducers of these enzymes should be avoided Each CYP enzyme metabolizes < 10% of the parent compound Since multiple CYP enzymes are involved, clinically relevant drug-drug interactions are less likely, as there are other alternative pathways A potent inhibitor of multiple pathways should be used with caution as that has the potential to elicit a clinically relevant drug-drug interaction i.e. medication that inhibits CYP3A4, CYP2C9, CYP2C19 PLCWC-37628s not an inducer of CYP1A2, CYP2B6, or CYP3A4 Showed moderate inhibition of P-gp and MATE1, substrates of P-gp and MATE1 may  have increased exposure when coadministered with  PLBTD-17616  Metabolized by extrahepatic enzymes: CYP1A1, UGT1A7 Clinical relevance relating to drug interactions is unknown Stable when incubated with human recombinant MAO-A and MAO-B ZOX-09604 is not an inhibitor or inducer of CYP isoforms and may be co-administered with substrates of CYP isoforms without risk of DDI.  Interactions with specific drugs: Potential for increased exposure of nintedanib Recommend separating time of administration of VWU-98119 and nintedanib Drug interactions with pirfenidone are not expected Use of corticosteroids should be discussed beforehand with the medical monitor   Clinical drug interactions studies have not been performed at this time and therefore guidance is based on the in-vitro studies  Disallowed Medications/Foods/Supplements: Fluconazole Digoxin Rifampin Grapefruit Grapefruit-containing foods and beverages St. John's Wort   Pharmacokinetics: Tmax : 2-3 hours Half-life: approximately 48 hours Steady state reached in 5-7 days Minimal renal clearance (8.5% of drug is cleared by the kidneys)    Administration [phone] should be administered on an empty stomach May be administered via feeding tube   Safety:   Edition Addendum to Edition 5.0 - 12 February 2020   Animal studies: No effect on neurobehavior No effect on respiratory parameters No effects on blood pressure Showed an increase in QT and QTc The high dose 400 mg/kg showed an increase in mean heart rate   Cardiovascular effects in mice after administration of OZH-08657: Dose-dependent increases in blood pressure Increase in PR and QRS duration Increase in arrhythmias  Reproductive animal toxicology studies at the low and medium doses, 150 and 300 mg/kg/day, revealed no impact on: maternal survival, fetal body weight, food parameters, uterine and ovarian parameters, no external malformations  When the high dose 600 mg/kg/day was tested in rabbits, the rabbits displayed  increased morbidities and increased abortions   Phase 1 studies:   Type of TEAE [id number] n=32 Placebo n=14 GI disorders 7 (22%) 0 Infections 3 (9%) 0 Musculoskeletal 1 (3%) 0 Nervous system disorders 2 (6%) 0   Most common treatment-emergent adverse event (TEAE):  headache  The most common drug-related adverse events were: nausea, diarrhea, abdominal distension. These occurred in 12.5% of participants  There were no deaths or serious adverse events in any of the Phase I studies There were no clinically meaningful changes in vital signs, telemetry or laboratory parameters [phone] did not display any clinically significant effect on ECG (iIncluding Qtc, HR, RR, PR and QRS)  Study [id number] All TEAEs were mild and deemed unrelated to study drug One TEAE, viral infection, led to discontinuation of IP No deaths nor SAEs  As of the 23 October 2019 cutoff date, preliminary data from ongoing and discontinued Phase 2a studies suggest no new safety concerns.  All SAE's reported to date were considered unrelated to study drug by the investigator. All treatment-emergent SAE's were known manifestations of the underlying  diseases (IPF, PSC or ARDS) and considered by the investigator to be most likely due to disease progresssion.  Part D of the study will evaluate approximately 28 participants at the 320 mg dose for at least 24 weeks to further characterize the safety profile of [id number]-74259. Ongoing safety and FVC assessments will continue until the last participant reaches 24 weeks of the study; therefore, safety and FVC assessments may extend to 48 weeks in participants randomized earlier in the study.   Clinical Research Coordinator / Research RN note : This visit for Subject Elizabeth Espinoza with DOB: [birth year] on 04/11/2020 for the above protocol is Visit #2  and is for purpose of research.   The consent for this encounter is  under Protocol Version Amendment 3 (Ver3.0) ,  Investigator Brochure Edition 5.0 Addendum,  Consent Version 5.1 and  is currently IRB approved.   Subject expressed continued interest and consent in continuing as a study subject. Subject confirmed that there was no change in contact information (e.g. address, telephone, email). Subject thanked for participation in research and contribution to science.  In this visit 04/11/2020 the subject will be evaluated by sub-investigator named Rexene Edison, NP . This research coordinator has verified that the sub-investigator uptodate with her training logs.  Because the PI is NOT available due to schedule issues, the sub-I reported and CRC has confirmed that the PI has discussed the visit a-priori with the sub-investigator  Study visit 2 assessments and procedures were completed per the above mentioned protocol up to completion of the physical exam and review of Inclusion/Exclusion criteria. The subject was a screen-fail today due to Exclusion criteria #3; she has a 3 day history of sinus symptoms, including sinus pressure and green sputum production. The sub-I , in consult with the PI, determined that the subject should not be randomized into the study today. She is, therefore, a screen-fail, and her study participation is now complete. We will inquire of the study sponsor whether she can be re-screened in the future for this study.  Signed by Hale Drone, MS, [place] Coordinator [place], [place] 10:29 AM 04/11/2020

## 2020-04-12 ENCOUNTER — Encounter: Payer: Self-pay | Admitting: Internal Medicine

## 2020-04-12 ENCOUNTER — Telehealth (INDEPENDENT_AMBULATORY_CARE_PROVIDER_SITE_OTHER): Payer: Medicare Other | Admitting: Internal Medicine

## 2020-04-12 DIAGNOSIS — J0100 Acute maxillary sinusitis, unspecified: Secondary | ICD-10-CM

## 2020-04-12 MED ORDER — AMOXICILLIN-POT CLAVULANATE 875-125 MG PO TABS: 1.0000 | ORAL_TABLET | Freq: Two times a day (BID) | ORAL | 0 refills | Status: DC

## 2020-04-12 NOTE — Assessment & Plan Note (Signed)
Acute Likely bacterial  Start Augmentin 875-125 mg BID x 10 day otc cold medications Rest, fluid Call if no improvement

## 2020-04-13 DIAGNOSIS — L308 Other specified dermatitis: Secondary | ICD-10-CM | POA: Diagnosis not present

## 2020-04-20 NOTE — Progress Notes (Signed)
Title: A randomized, double-blind, multicenter, dose-ranging, placebo-controlled Phase 2a evaluation of the safety, tolerability and pharmacokinetics of [id number] in participants with idiopathic pulmonary fibrosis (IPF)   Patients will be randomized in a 3:1 ratio to one of two treatment arms:  320 mg [id number] or placebo.   Protocol #: [id number], Clinical Trials EUDRACT# 2019-002709-23  ** Sponsor [employer] [email or web address]   ([place] [address])  Part D of the study will evaluate approximately 28 participants at the 320 mg dose for at least 24 weeks to further characterize the safety profile of LVX-94129. Ongoing safety and FVC assessments will continue until the last participant reaches 24 weeks of the study; therefore, safety and FVC assessments may extend to 48 weeks in participants randomized earlier in the study.  xxxxxx .This visit for Subject Elizabeth Espinoza with DOB: Feb 15, 1940 on 04/20/2020 for the above protocol is Visit/Encounter # consent and screening  and is for purpose of research . Subject/LAR expressed continued interest and consent in continuing as a study subject. Subject thanked for participation in research and contribution to science.    Exam done post consent  A Research subject IPF  P [employer] study Part D per protocol    SIGNATURE    Dr. Brand Males, M.D., F.C.C.P, ACRP-CPI Pulmonary and Critical Care Medicine Research Investigator, [facility] [place] Staff Physician, [place] Director - Interstitial Lung Disease  Program  [facility] [place], [place], [address]   Pager: [phone], If no answer  OR between  19:00-7:00h: page [phone] Telephone (research): [phone]  9:33 AM 04/20/2020   9:33 AM 04/20/2020

## 2020-04-25 ENCOUNTER — Encounter: Payer: Medicare Other | Admitting: Adult Health

## 2020-05-02 NOTE — Progress Notes (Signed)
HPI: FU dyspnea.ETTSeptember 2016 normal. CPXJanuary 2020 showed normal functional capacity for agewith duration8:45;no ST changes;patient complained of chest pain, dyspnea and dizziness;there were other abnormal parameters suggesting cardiovascular limitation and also pulmonary impairment. Echocardiogram May 2020 showed normal LV function, grade 2 diastolic dysfunction and mild mitral valve prolapse.  Cardiac CTA June 2020 showed a calcium score of 0 and minimal nonobstructive coronary disease. Chest CT March 2022 showed pulmonary fibrosis and three-vessel coronary atherosclerosis.  Since last seen she does note dyspnea on exertion but no orthopnea, PND or pedal edema.  She continues to have chest heaviness/pressure with ambulation particularly.  This increases with inspiration and is chronic.  Has been present intermittently for years and is unchanged.  Current Outpatient Medications  Medication Sig Dispense Refill  . aspirin EC 81 MG tablet Take 1 tablet (81 mg total) by mouth daily. 90 tablet 3  . betamethasone, augmented, (DIPROLENE) 0.05 % lotion Apply 1 application topically 2 (two) times daily.    . cetirizine (ZYRTEC) 10 MG tablet Take 10 mg by mouth daily.     . cholecalciferol (VITAMIN D3) 25 MCG (1000 UT) tablet Take 1,000 Units by mouth daily.    Marland Kitchen esomeprazole (NEXIUM) 40 MG capsule Take 40 mg by mouth every morning.    . famotidine (PEPCID) 40 MG tablet Take 1 tablet (40 mg total) by mouth daily. 90 tablet 1  . fluticasone (FLONASE) 50 MCG/ACT nasal spray Place 1 spray into both nostrils daily as needed for allergies or rhinitis.    . hydroxychloroquine (PLAQUENIL) 200 MG tablet Take 200 mg by mouth 2 (two) times daily.    Marland Kitchen levothyroxine (SYNTHROID) 100 MCG tablet Take 1 tablet (100 mcg total) by mouth daily. 90 tablet 3  . magnesium oxide (MAG-OX) 400 MG tablet Take 400 mg by mouth daily.    . Multiple Vitamins-Minerals (ZINC PO) Take 1 tablet by mouth.    .  pravastatin (PRAVACHOL) 40 MG tablet TAKE 1 TABLET BY MOUTH  DAILY 90 tablet 3  . rOPINIRole (REQUIP) 0.5 MG tablet TAKE 2 TABLETS BY MOUTH  AFTER DINNER EACH NIGHT. 180 tablet 3  . vitamin B-12 (CYANOCOBALAMIN) 500 MCG tablet Take 500 mcg by mouth daily.     No current facility-administered medications for this visit.     Past Medical History:  Diagnosis Date  . Allergic rhinitis   . GERD (gastroesophageal reflux disease)   . Glaucoma    SUSPECT  . Hiatal hernia   . Hyperlipidemia    LDL goal = < 100  . Hypothyroidism   . ILD (interstitial lung disease) ([place])   . Lichen planopilaris   . MVP (mitral valve prolapse)    documented on 2 D ECHO  . Restless legs   . Urticaria     Past Surgical History:  Procedure Laterality Date  . APPENDECTOMY     with TAH  . CARDIAC CATHETERIZATION  1989   negative  . COLONOSCOPY  2005   Dr Olevia Perches  . COLONOSCOPY  01/2013   Dr Collene Mares  . esophageal dilation  07/04/2011   Dr Olevia Perches  . FLEXIBLE SIGMOIDOSCOPY  1999    Dr Olevia Perches  . KNEE ARTHROSCOPY  2001, 2005   Dr Percell Miller   bilat  . TOTAL ABDOMINAL HYSTERECTOMY     for Endometriosis; no BSO, Dr Colin Ina  . UPPER GI ENDOSCOPY  01/2013   Dr Collene Mares  . VIDEO BRONCHOSCOPY Bilateral 02/05/2019   Procedure: VIDEO BRONCHOSCOPY WITH FLUORO;  Surgeon:  Brand Males, MD;  Location: [facility] ENDOSCOPY;  Service: Endoscopy;  Laterality: Bilateral;    Social History   Socioeconomic History  . Marital status: Married    Spouse name: Jeneen Rinks  . Number of children: 2  . Years of education: Not on file  . Highest education level: Not on file  Occupational History  . Occupation: retired from [employer] retired  Tobacco Use  . Smoking status: Never Smoker  . Smokeless tobacco: Never Used  Vaping Use  . Vaping Use: Never used  Substance and Sexual Activity  . Alcohol use: Yes    Comment:  rarely  . Drug use: No  . Sexual activity: Yes  Other Topics Concern  . Not on file  Social History Narrative   . Not on file   Social Determinants of Health   Financial Resource Strain: Not on file  Food Insecurity: Not on file  Transportation Needs: Not on file  Physical Activity: Not on file  Stress: Not on file  Social Connections: Not on file  Intimate Partner Violence: Not on file    Family History  Problem Relation Age of Onset  . Lung cancer Father        Asbestos with mesothelioma  . Heart disease Paternal Grandmother        ? etiology  . Diabetes Paternal Grandmother   . Diabetes Brother   . Uterine cancer Paternal Aunt   . Stroke Paternal Grandfather        > 67  . Heart attack Brother 44  . Allergies Sister   . Aneurysm Mother        congenital  . Stroke Brother        two strokes  . Barrett's esophagus Brother     ROS: Hip pain but no fevers or chills, productive cough, hemoptysis, dysphasia, odynophagia, melena, hematochezia, dysuria, hematuria, rash, seizure activity, orthopnea, PND, pedal edema, claudication. Remaining systems are negative.  Physical Exam: Well-developed well-nourished in no acute distress.  Skin is warm and dry.  HEENT is normal.  Neck is supple.  Chest is clear to auscultation with normal expansion.  Cardiovascular exam is regular rate and rhythm.  Abdominal exam nontender or distended. No masses palpated. Extremities show no edema. neuro grossly intact  ECG-normal sinus rhythm at a rate of 67, no ST changes.  Personally reviewed  A/P  1 dyspnea-felt secondary to interstitial lung disease.  Previous cardiac evaluation as outlined above showed no obstructive coronary disease and normal LV function.  2 coronary artery disease-plan to continue aspirin and statin.  She does have chest pressure with ambulation but this is longstanding.  It also increases with inspiration.  Previous CT showed no obstructive coronary disease.  We will follow for now.  Note electrocardiogram shows no ST changes.  3 hyperlipidemia-continue statin.  4  interstitial lung disease-managed by pulmonary.  Kirk Ruths, MD

## 2020-05-09 ENCOUNTER — Encounter: Payer: Medicare Other | Admitting: Internal Medicine

## 2020-05-09 ENCOUNTER — Encounter: Payer: Self-pay | Admitting: Internal Medicine

## 2020-05-09 ENCOUNTER — Other Ambulatory Visit: Payer: Self-pay | Admitting: Internal Medicine

## 2020-05-09 DIAGNOSIS — Z006 Encounter for examination for normal comparison and control in clinical research program: Secondary | ICD-10-CM

## 2020-05-09 DIAGNOSIS — J84112 Idiopathic pulmonary fibrosis: Secondary | ICD-10-CM

## 2020-05-09 DIAGNOSIS — E039 Hypothyroidism, unspecified: Secondary | ICD-10-CM

## 2020-05-10 ENCOUNTER — Ambulatory Visit: Payer: Medicare Other | Admitting: Cardiology

## 2020-05-10 ENCOUNTER — Other Ambulatory Visit: Payer: Self-pay

## 2020-05-10 ENCOUNTER — Encounter: Payer: Self-pay | Admitting: Cardiology

## 2020-05-10 VITALS — BP 128/66 | HR 67 | Ht 66.0 in | Wt 144.0 lb

## 2020-05-10 DIAGNOSIS — R0602 Shortness of breath: Secondary | ICD-10-CM

## 2020-05-10 DIAGNOSIS — I2584 Coronary atherosclerosis due to calcified coronary lesion: Secondary | ICD-10-CM

## 2020-05-10 DIAGNOSIS — E782 Mixed hyperlipidemia: Secondary | ICD-10-CM

## 2020-05-10 DIAGNOSIS — I2510 Atherosclerotic heart disease of native coronary artery without angina pectoris: Secondary | ICD-10-CM

## 2020-05-10 NOTE — Patient Instructions (Signed)
Medication Instructions:  Your physician recommends that you continue on your current medications as directed. Please refer to the Current Medication list given to you today.  *If you need a refill on your cardiac medications before your next appointment, please call your pharmacy*   Lab Work: Lipid panel, Liver function to be drawn at PCP.   If you have labs (blood work) drawn today and your tests are completely normal, you will receive your results only by: Marland Kitchen MyChart Message (if you have MyChart) OR . A paper copy in the mail If you have any lab test that is abnormal or we need to change your treatment, we will call you to review the results.   Testing/Procedures: None ordered  Follow-Up: At [facility], you and your health needs are our priority.  As part of our continuing mission to provide you with exceptional heart care, we have created designated Provider Care Teams.  These Care Teams include your primary Cardiologist (physician) and Advanced Practice Providers (APPs -  Physician Assistants and Nurse Practitioners) who all work together to provide you with the care you need, when you need it.  We recommend signing up for the patient portal called "MyChart".  Sign up information is provided on this After Visit Summary.  MyChart is used to connect with patients for Virtual Visits (Telemedicine).  Patients are able to view lab/test results, encounter notes, upcoming appointments, etc.  Non-urgent messages can be sent to your provider as well.   To learn more about what you can do with MyChart, go to [email or web address].    Your next appointment:   12 months The format for your next appointment:   In Person  Provider:   Kirk Ruths, MD

## 2020-05-11 ENCOUNTER — Telehealth: Payer: Self-pay | Admitting: Internal Medicine

## 2020-05-11 DIAGNOSIS — E782 Mixed hyperlipidemia: Secondary | ICD-10-CM

## 2020-05-11 DIAGNOSIS — E039 Hypothyroidism, unspecified: Secondary | ICD-10-CM

## 2020-05-11 NOTE — Telephone Encounter (Signed)
Now I am told is both Dr Stanford Breed and Dr Quay Burow orders. Sorry for inconvenience

## 2020-05-11 NOTE — Telephone Encounter (Signed)
[facility]  Patient Elizabeth Espinoza coming 05/12/20  for research visit and Benay Spice will be drawing research labs. Could you please look at Dr Stanford Breed (cards) note from yesterday and do those orders as standard of care under my name for 05/12/20. This will help minimize sticks (she is a hard stick). IRma said she cannot honor Dr Stanford Breed order (I am assuming this is because we are [place] v Dr Stanford Breed is [facility])  Thanks  MR

## 2020-05-11 NOTE — Telephone Encounter (Signed)
Labs have been placed under MR's name so that pt can have them drawn when she comes in for research visit.  Called and spoke with pt letting her know that she will be able to have the labs drawn at our office as well that have been ordered by PCP and cardiology as we will order them under MR so Irma can be able to draw them. Pt verbalized understanding. Nothing further needed.

## 2020-05-12 ENCOUNTER — Encounter (INDEPENDENT_AMBULATORY_CARE_PROVIDER_SITE_OTHER): Payer: Medicare Other | Admitting: Internal Medicine

## 2020-05-12 ENCOUNTER — Encounter: Payer: Medicare Other | Admitting: *Deleted

## 2020-05-12 ENCOUNTER — Other Ambulatory Visit: Payer: Self-pay

## 2020-05-12 DIAGNOSIS — E782 Mixed hyperlipidemia: Secondary | ICD-10-CM | POA: Diagnosis not present

## 2020-05-12 DIAGNOSIS — J84112 Idiopathic pulmonary fibrosis: Secondary | ICD-10-CM

## 2020-05-12 DIAGNOSIS — Z006 Encounter for examination for normal comparison and control in clinical research program: Secondary | ICD-10-CM

## 2020-05-12 DIAGNOSIS — E039 Hypothyroidism, unspecified: Secondary | ICD-10-CM | POA: Diagnosis not present

## 2020-05-12 LAB — HEPATIC FUNCTION PANEL
ALT: 15 U/L (ref 0–35)
AST: 19 U/L (ref 0–37)
Albumin: 4.3 g/dL (ref 3.5–5.2)
Alkaline Phosphatase: 79 U/L (ref 39–117)
Bilirubin, Direct: 0.1 mg/dL (ref 0.0–0.3)
Total Bilirubin: 0.7 mg/dL (ref 0.2–1.2)
Total Protein: 7 g/dL (ref 6.0–8.3)

## 2020-05-12 LAB — LIPID PANEL
Cholesterol: 140 mg/dL (ref 0–200)
HDL: 57.2 mg/dL (ref >39.00)
LDL Cholesterol: 63 mg/dL (ref 0–99)
NonHDL: 82.46
Total CHOL/HDL Ratio: 2
Triglycerides: 95 mg/dL (ref 0.0–149.0)
VLDL: 19 mg/dL (ref 0.0–40.0)

## 2020-05-12 LAB — TSH: TSH: 1.06 u[IU]/mL (ref 0.35–4.50)

## 2020-05-12 NOTE — Research (Signed)
Title: A randomized, double-blind, multicenter, dose-ranging, placebo-controlled Phase 2a evaluation of the safety, tolerability and pharmacokinetics of [id number] in participants with idiopathic pulmonary fibrosis (IPF)   Patients will be randomized in a 3:1 ratio to one of two treatment arms:  320 mg [id number] or placebo.   Protocol #: [id number], Clinical Trials EUDRACT# 2019-002709-23  ** Sponsor [employer] [email or web address]   ([place] [address])   Protocol Version Amendment 3 (ver.3.0) for 05/12/2020 Date of 19OCT2021  (double checked yes) Consent Version 5.1 dated 11Jan2022, approved  for 05/12/2020 date of  (double checked yes) Investigator Brochure Version 5.0  Addendum for 05/12/2020 date of 21JAN2022   Key Inclusion Criteria:   Age >/= 79 years old Confident diagnosis of IPF  up to 5 years. If IPF diagnosis is within > 3 to = 5 years at screening, the participant must have evidence of progression within the last 24 months, as defined by decline in FVC percent predicted based on a relative decline of = 5% FVC% pred >/=45%  DLCO (hgb-adjusted) >/=30% Treatment with nintedanib or pirfenidone allowed, But must have stable dose =3 months before Screening Visit and remain unchanged during the study   Key Exclusion Criteria:   Receiving any non-approved agent intended for treatment of fibrosis in IPF FEV1 over the FVC ratio < 0.7 at screening Active infection,  that can affect FVC measurement during Screening or  Randomization Extent of emphysema > fibrotic changes on most recent HRCT IPF exacerbation within 6 months of Screening  Severe pulmonary hypertension Smoking of any kind w/in 3 months of Screening or unwilling to avoid smoking throughout the study Renal impairment or end-stage kidney disease requiring dialysis History of unstable /deteriorating cardiac/pulmonary disease (other than IPF) w/in 6 months of Screening Any ongoing known malignancy, except  for localized cancers (i.e. basal cell carcinoma) Medical or surgical condition known to affect drug absorption Surgeries scheduled during the study period    Side-Effects: [id number] was generally well tolerated in over 280 healthy participants with no clinical safety concerns or dose-limiting toxicities observed for single doses up to 640m and multiple doses up to 3241mQD administered for up to 14 days. The major side-effects reported have been headache (mild) and constipation (mild) in 3-76% of subjects. Other side effects, in 1-2% of subjects, were mild to moderate, with fever being the only severe side-effect reported.  Mechanism: Selective dual aV6 and aV1 integrin inhibitor with antifibrotic activity. [id number]-44010revents aV6 and aV1 from binding to the RGD sequence of latency-associated peptide (LAP), this blocks the release of activated TGF-1, thereby blocking the activation of pro-fibrotic pathways. Additionally, PLUVO-53664as been shown to reduce collagen synthesis and gene expression in human IPF tissue.   Interactions: Acts as a substrate of P-gp, OATP1B1, OATP1B3, OAT3 Inhibitors or inducers of these enzymes may affect the intestinal absorption, hepatic uptake or renal uptake of [id number]-47425etabolized by CYP3A4, CYP2C9, CYP2C19 Potent inhibitors or inducers of these enzymes should be avoided Each CYP enzyme metabolizes < 10% of the parent compound Since multiple CYP enzymes are involved, clinically relevant drug-drug interactions are less likely, as there are other alternative pathways A potent inhibitor of multiple pathways should be used with caution as that has the potential to elicit a clinically relevant drug-drug interaction i.e. medication that inhibits CYP3A4, CYP2C9, CYP2C19 [id number]-38756s not an inducer of CYP1A2, CYP2B6, or CYP3A4 Showed moderate inhibition of P-gp and MATE1, substrates of P-gp and MATE1 may  have increased exposure when coadministered with  [id number]-29518etabolized  by extrahepatic enzymes: CYP1A1, UGT1A7 Clinical relevance relating to drug interactions is unknown Stable when incubated with human recombinant MAO-A and MAO-B [id number] is not an inhibitor or inducer of CYP isoforms and may be co-administered with substrates of CYP isoforms without risk of DDI.  Interactions with specific drugs: Potential for increased exposure of nintedanib Recommend separating time of administration of [id number] and nintedanib Drug interactions with pirfenidone are not expected Use of corticosteroids should be discussed beforehand with the medical monitor   Clinical drug interactions studies have not been performed at this time and therefore guidance is based on the in-vitro studies  Disallowed Medications/Foods/Supplements: Fluconazole Digoxin Rifampin Grapefruit Grapefruit-containing foods and beverages St. John's Wort   Pharmacokinetics: Tmax : 2-3 hours Half-life: approximately 48 hours Steady state reached in 5-7 days Minimal renal clearance (8.5% of drug is cleared by the kidneys)    Administration [phone] should be administered on an empty stomach May be administered via feeding tube   Safety:   Edition Addendum to Edition 5.0 - 12 February 2020   Animal studies: No effect on neurobehavior No effect on respiratory parameters No effects on blood pressure Showed an increase in QT and QTc The high dose 400 mg/kg showed an increase in mean heart rate   Cardiovascular effects in mice after administration of [id number]: Dose-dependent increases in blood pressure Increase in PR and QRS duration Increase in arrhythmias  Reproductive animal toxicology studies at the low and medium doses, 150 and 300 mg/kg/day, revealed no impact on: maternal survival, fetal body weight, food parameters, uterine and ovarian parameters, no external malformations  When the high dose 600 mg/kg/day was tested in rabbits, the rabbits displayed  increased morbidities and increased abortions   Phase 1 studies:   Type of TEAE [id number] n=32 Placebo n=14 GI disorders 7 (22%) 0 Infections 3 (9%) 0 Musculoskeletal 1 (3%) 0 Nervous system disorders 2 (6%) 0   Most common treatment-emergent adverse event (TEAE):  headache  The most common drug-related adverse events were: nausea, diarrhea, abdominal distension. These occurred in 12.5% of participants  There were no deaths or serious adverse events in any of the Phase I studies There were no clinically meaningful changes in vital signs, telemetry or laboratory parameters [phone] did not display any clinically significant effect on ECG (iIncluding Qtc, HR, RR, PR and QRS)  Study [id number] All TEAEs were mild and deemed unrelated to study drug One TEAE, viral infection, led to discontinuation of IP No deaths nor SAEs  As of the 23 October 2019 cutoff date, preliminary data from ongoing and discontinued Phase 2a studies suggest no new safety concerns.  All SAE's reported to date were considered unrelated to study drug by the investigator. All treatment-emergent SAE's were known manifestations of the underlying  diseases (IPF, PSC or ARDS) and considered by the investigator to be most likely due to disease progresssion.  Part D of the study will evaluate approximately 28 participants at the 320 mg dose for at least 24 weeks to further characterize the safety profile of [id number]. Ongoing safety and FVC assessments will continue until the last participant reaches 24 weeks of the study; therefore, safety and FVC assessments may extend to 48 weeks in participants randomized earlier in the study.  Clinical Research Coordinator / Research RN note : This visit for Subject Elizabeth Espinoza with DOB: [birth year] on 05/12/2020 for the above protocol is Visit #1  and is for purpose of research.   The consent for this encounter is under Protocol  Version Amendment 3.0 , Investigator  Brochure Edition v.5.0 Addendum Consent Version 5.1 and  Is currently IRB approved.   Subject expressed continued interest and consent in continuing as a study subject. Subject confirmed that there was no change in contact information (e.g. address, telephone, email). Subject thanked for participation in research and contribution to science.  In this visit 05/12/2020 the subject will be evaluated by investigator named Dr. Chase Caller . This research coordinator has verified that the investigator is up to date with his training logs.  Because this visit is a key visit of screening, this visit is under direct supervision of the PI ,Dr. Chase Caller . This PI is available for this visit.  This is a re-screening visit for this subject. She was previously a screen fail for this study on 21Mar2022 due to sinusitis, which has since been treated with antibiotics and resolved. Per sponsor, the subject had to sign a new consent in order to re-screen. The consent form was reviewed with the subject and all questions were answered. No study related procedures were performed prior to consent being signed. Dr. Chase Caller was present for the consent process. See the Consent Process Documentation Checklist for details of the consent process. After signing the consent, all study visit 1 procedures and assessments were completed per the above mentioned protocol. Please see the subject's study binder for further details of the visit. If all criteria are met, the subject will return within 28 days for study visit 2.  Signed by Hale Drone, MS, [place] Coordinator  [place], [place] 3:09 PM 05/12/2020

## 2020-05-13 ENCOUNTER — Ambulatory Visit (INDEPENDENT_AMBULATORY_CARE_PROVIDER_SITE_OTHER)
Admission: RE | Admit: 2020-05-13 | Discharge: 2020-05-13 | Disposition: A | Discharge status: Home / Self Care | Payer: Self-pay | Source: Ambulatory Visit | Attending: Internal Medicine | Admitting: Internal Medicine

## 2020-05-13 ENCOUNTER — Other Ambulatory Visit: Payer: Self-pay

## 2020-05-13 DIAGNOSIS — J84112 Idiopathic pulmonary fibrosis: Secondary | ICD-10-CM

## 2020-05-13 DIAGNOSIS — K449 Diaphragmatic hernia without obstruction or gangrene: Secondary | ICD-10-CM | POA: Diagnosis not present

## 2020-05-13 DIAGNOSIS — I2510 Atherosclerotic heart disease of native coronary artery without angina pectoris: Secondary | ICD-10-CM | POA: Diagnosis not present

## 2020-05-13 DIAGNOSIS — J479 Bronchiectasis, uncomplicated: Secondary | ICD-10-CM | POA: Diagnosis not present

## 2020-05-13 DIAGNOSIS — Z006 Encounter for examination for normal comparison and control in clinical research program: Secondary | ICD-10-CM

## 2020-05-16 DIAGNOSIS — Z20822 Contact with and (suspected) exposure to covid-19: Secondary | ICD-10-CM | POA: Diagnosis not present

## 2020-06-01 ENCOUNTER — Encounter (INDEPENDENT_AMBULATORY_CARE_PROVIDER_SITE_OTHER): Payer: Medicare Other | Admitting: Adult Health

## 2020-06-01 ENCOUNTER — Telehealth: Payer: Medicare Other

## 2020-06-01 ENCOUNTER — Encounter: Payer: Medicare Other | Admitting: *Deleted

## 2020-06-01 ENCOUNTER — Other Ambulatory Visit: Payer: Self-pay

## 2020-06-01 DIAGNOSIS — Z006 Encounter for examination for normal comparison and control in clinical research program: Secondary | ICD-10-CM

## 2020-06-01 DIAGNOSIS — J849 Interstitial pulmonary disease, unspecified: Secondary | ICD-10-CM

## 2020-06-01 DIAGNOSIS — J84112 Idiopathic pulmonary fibrosis: Secondary | ICD-10-CM

## 2020-06-01 NOTE — Assessment & Plan Note (Signed)
Continue with research protocol

## 2020-06-01 NOTE — Research (Addendum)
Title: A randomized, double-blind, multicenter, dose-ranging, placebo-controlled Phase 2a evaluation of the safety, tolerability and pharmacokinetics of [id number] in participants with idiopathic pulmonary fibrosis (IPF)   Patients will be randomized in a 3:1 ratio to one of two treatment arms:  320 mg [id number] or placebo.   Protocol #: [id number], Clinical Trials EUDRACT# 2019-002709-23  ** Sponsor [employer] [email or web address]   ([place] [address])   Protocol Version Amendment 3 (ver.3.0) for 06/01/2020 Date of 19OCT2021  (double checked  Yes) Consent Version 5.1 dated 11Jan2022, approved  for 06/01/2020 date of  (double checked  yes) Investigator Brochure Version 5.0  Addendum for 06/01/2020 date of 21JAN2022   Clinical Research Coordinator / Research RN note : This visit for Subject Elizabeth Espinoza with DOB: 19-Jan-1941 on 06/01/2020 for the above protocol is Visit #2  and is for purpose of research.   The consent for this encounter is under Protocol Version Amendment 3.0 , Investigator Brochure Edition Version 5.0 Addendum, Consent Version 5.1 and is currently IRB approved.   Subject expressed continued interest and consent in continuing as a study subject. Subject confirmed that there was no change in contact information (e.g. address, telephone, email). Subject thanked for participation in research and contribution to science.  In this visit 06/01/2020 the subject will be evaluated by sub-investigator named Rexene Edison, NP . This research coordinator has verified that the investigator is up to date with her training logs.  The sub-I reported and CRC has confirmed that the PI has discussed the visit a-priori with the sub-investigator for this key visit  All study visit 2 assessments and procedures were completed per the above mentioned protocol. The subject had previously reported to [place] that she tested positive for Covid-19 on 25Apr2022. She had very mild upper  respiratory symptoms (sore throat and headache, no fever) for 4-5 days and stated that it did not affect her breathing. She is asymptomatic today and tested negative by rapid antigen test today. The sponsor approved going forward with randomization if she was asymptomatic and could perform spirometry appropriately. Sub-I confirmed this during the physical exam. The subject was randomized and received the first dose of study medication at the site today. The medication appeared to be well-tolerated with no complaints. Refer to the subject's study binder for further details of the visit. She will return in 2 weeks for study visit 3.  Signed by Hale Drone, MS, [place] Coordinator  [place], [place] 4:48 PM 06/01/2020

## 2020-06-01 NOTE — Progress Notes (Signed)
_0  ID: [name], female    DOB: May 15, 1940, 80 y.o.   MRN: [id number]    Referring provider: Binnie Rail, MD  HPI: Title: A randomized, double-blind, multicenter, dose-ranging, placebo-controlled Phase 2a evaluation of the safety, tolerability and pharmacokinetics of [phone] in participants with idiopathic pulmonary fibrosis (IPF)   Patients will be randomized in a 3:1 ratio to one of two treatment arms:  320 mg [id number] or placebo.   Protocol #: [id number], Clinical Trials EUDRACT# 2019-002709-23  ** Sponsor [employer] [email or web address]   ([place] [address])   Protocol Version Amendment 3 (ver.3.0) for 06/01/2020 Date of 19OCT2021  (double checked  yes) Consent Version 5.1 dated 11Jan2022, approved  for 06/01/2020 date of  (double checked yes ) Investigator Brochure Version 5.0  Addendum for 06/01/2020 date of 21JAN2022   Key Inclusion Criteria:   Age >/= 79 years old Confident diagnosis of IPF  up to 5 years. If IPF diagnosis is within > 3 to = 5 years at screening, the participant must have evidence of progression within the last 24 months, as defined by decline in FVC percent predicted based on a relative decline of = 5% FVC% pred >/=45%  DLCO (hgb-adjusted) >/=30% Treatment with nintedanib or pirfenidone allowed, But must have stable dose =3 months before Screening Visit and remain unchanged during the study   Key Exclusion Criteria:   Receiving any non-approved agent intended for treatment of fibrosis in IPF FEV1 over the FVC ratio < 0.7 at screening Active infection,  that can affect FVC measurement during Screening or  Randomization Extent of emphysema > fibrotic changes on most recent HRCT IPF exacerbation within 6 months of Screening  Severe pulmonary hypertension Smoking of any kind w/in 3 months of Screening or unwilling to avoid smoking throughout the study Renal impairment or end-stage kidney disease requiring  dialysis History of unstable /deteriorating cardiac/pulmonary disease (other than IPF) w/in 6 months of Screening Any ongoing known malignancy, except for localized cancers (i.e. basal cell carcinoma) Medical or surgical condition known to affect drug absorption Surgeries scheduled during the study period    Side-Effects: [id number] was generally well tolerated in over 280 healthy participants with no clinical safety concerns or dose-limiting toxicities observed for single doses up to 647m and multiple doses up to 3254mQD administered for up to 14 days. The major side-effects reported have been headache (mild) and constipation (mild) in 3-76% of subjects. Other side effects, in 1-2% of subjects, were mild to moderate, with fever being the only severe side-effect reported.  Mechanism: Selective dual aV6 and aV1 integrin inhibitor with antifibrotic activity. [id number] aV6 and aV1 from binding to the RGD sequence of latency-associated peptide (LAP), this blocks the release of activated TGF-1, thereby blocking the activation of pro-fibrotic pathways. Additionally, PLYBO-17510as been shown to reduce collagen synthesis and gene expression in human IPF tissue.   Interactions: Acts as a substrate of P-gp, OATP1B1, OATP1B3, OAT3 Inhibitors or inducers of these enzymes may affect the intestinal absorption, hepatic uptake or renal uptake of [id number] by CYP3A4, CYP2C9, CYP2C19 Potent inhibitors or inducers of these enzymes should be avoided Each CYP enzyme metabolizes < 10% of the parent compound Since multiple CYP enzymes are involved, clinically relevant drug-drug interactions are less likely, as there are other alternative pathways A potent inhibitor of multiple pathways should be used with caution as that has the potential to elicit a clinically relevant drug-drug interaction i.e. medication that inhibits CYP3A4, CYP2C9, CYP2C19 PLEUM-35361s  not an inducer of CYP1A2,  CYP2B6, or CYP3A4 Showed moderate inhibition of P-gp and MATE1, substrates of P-gp and MATE1 may  have increased exposure when coadministered with SMO-70786 Metabolized by extrahepatic enzymes: CYP1A1, UGT1A7 Clinical relevance relating to drug interactions is unknown Stable when incubated with human recombinant MAO-A and MAO-B LJQ-49201 is not an inhibitor or inducer of CYP isoforms and may be co-administered with substrates of CYP isoforms without risk of DDI.  Interactions with specific drugs: Potential for increased exposure of nintedanib Recommend separating time of administration of EOF-12197 and nintedanib Drug interactions with pirfenidone are not expected Use of corticosteroids should be discussed beforehand with the medical monitor   Clinical drug interactions studies have not been performed at this time and therefore guidance is based on the in-vitro studies  Disallowed Medications/Foods/Supplements: Fluconazole Digoxin Rifampin Grapefruit Grapefruit-containing foods and beverages St. John's Wort   Pharmacokinetics: Tmax : 2-3 hours Half-life: approximately 48 hours Steady state reached in 5-7 days Minimal renal clearance (8.5% of drug is cleared by the kidneys)    Administration [phone] should be administered on an empty stomach May be administered via feeding tube   Safety:   Edition Addendum to Edition 5.0 - 12 February 2020   Animal studies: No effect on neurobehavior No effect on respiratory parameters No effects on blood pressure Showed an increase in QT and QTc The high dose 400 mg/kg showed an increase in mean heart rate   Cardiovascular effects in mice after administration of [id number]-41583: Dose-dependent increases in blood pressure Increase in PR and QRS duration Increase in arrhythmias  Reproductive animal toxicology studies at the low and medium doses, 150 and 300 mg/kg/day, revealed no impact on: maternal survival, fetal body weight, food  parameters, uterine and ovarian parameters, no external malformations  When the high dose 600 mg/kg/day was tested in rabbits, the rabbits displayed increased morbidities and increased abortions   Phase 1 studies:   Type of TEAE [id number]-07680 n=32 Placebo n=14 GI disorders 7 (22%) 0 Infections 3 (9%) 0 Musculoskeletal 1 (3%) 0 Nervous system disorders 2 (6%) 0   Most common treatment-emergent adverse event (TEAE):  headache  The most common drug-related adverse events were: nausea, diarrhea, abdominal distension. These occurred in 12.5% of participants  There were no deaths or serious adverse events in any of the Phase I studies There were no clinically meaningful changes in vital signs, telemetry or laboratory parameters [phone] did not display any clinically significant effect on ECG (iIncluding Qtc, HR, RR, PR and QRS)  Study [id number] All TEAEs were mild and deemed unrelated to study drug One TEAE, viral infection, led to discontinuation of IP No deaths nor SAEs  As of the 23 October 2019 cutoff date, preliminary data from ongoing and discontinued Phase 2a studies suggest no new safety concerns.  All SAE's reported to date were considered unrelated to study drug by the investigator. All treatment-emergent SAE's were known manifestations of the underlying  diseases (IPF, PSC or ARDS) and considered by the investigator to be most likely due to disease progresssion.  Part D of the study will evaluate approximately 28 participants at the 320 mg dose for at least 24 weeks to further characterize the safety profile of [id number]. Ongoing safety and FVC assessments will continue until the last participant reaches 24 weeks of the study; therefore, safety and FVC assessments may extend to 48 weeks in participants randomized earlier in the study.   06/01/2020 Research Visit #2 , Randomization Visit  This visit for Subject  Elizabeth Espinoza with DOB: Dec 24, 1940 on 06/01/2020 for  the above protocol is Visit/Encounter #2   and is for purpose of Randomization  . Subject/LAR expressed continued interest and consent in continuing as a study subject. Subject thanked for participation in research and contribution to science.  Subject did have Covid infection on 4/25 which she describes as very mild with sore throat and post nasal drainage that has completely resolved. Did not require antibiotics.   Allergies  Allergen Reactions  . Caffeine     palpitations  . Chlorpheniramine-Pseudoeph     palpitations  . Maxifed     palpitations  . Other Other (See Comments)  . Pirfenidone Nausea Only    Esbriet Causes nausea and weakness    Immunization History  Administered Date(s) Administered  . Influenza Split 11/07/2010  . Influenza Whole 10/30/2009, 11/27/2011  . Influenza, High Dose Seasonal PF 10/27/2012, 11/05/2013, 10/01/2017, 09/11/2018, 11/23/2019  . Influenza-Unspecified 11/24/2014, 10/07/2015, 10/08/2016  . PFIZER(Purple Top)SARS-COV-2 Vaccination 03/14/2019, 04/06/2019, 10/27/2019  . Pneumococcal Conjugate-13 10/07/2015  . Pneumococcal Polysaccharide-23 07/02/2017  . Tdap 02/11/2012  . Zoster 01/09/2012  . Zoster Recombinat (Shingrix) 10/01/2017, 12/03/2017    Past Medical History:  Diagnosis Date  . Allergic rhinitis   . GERD (gastroesophageal reflux disease)   . Glaucoma    SUSPECT  . Hiatal hernia   . Hyperlipidemia    LDL goal = < 100  . Hypothyroidism   . ILD (interstitial lung disease) ([place])   . Lichen planopilaris   . MVP (mitral valve prolapse)    documented on 2 D ECHO  . Restless legs   . Urticaria     Tobacco History: Social History   Tobacco Use  Smoking Status Never Smoker  Smokeless Tobacco Never Used   Counseling given: Not Answered   Outpatient Medications Prior to Visit  Medication Sig Dispense Refill  . aspirin EC 81 MG tablet Take 1 tablet (81 mg total) by mouth daily. 90 tablet 3  . betamethasone, augmented,  (DIPROLENE) 0.05 % lotion Apply 1 application topically 2 (two) times daily.    . cetirizine (ZYRTEC) 10 MG tablet Take 10 mg by mouth daily.     . cholecalciferol (VITAMIN D3) 25 MCG (1000 UT) tablet Take 1,000 Units by mouth daily.    Marland Kitchen esomeprazole (NEXIUM) 40 MG capsule Take 40 mg by mouth every morning.    . famotidine (PEPCID) 40 MG tablet Take 1 tablet (40 mg total) by mouth daily. 90 tablet 1  . fluticasone (FLONASE) 50 MCG/ACT nasal spray Place 1 spray into both nostrils daily as needed for allergies or rhinitis.    . hydroxychloroquine (PLAQUENIL) 200 MG tablet Take 200 mg by mouth 2 (two) times daily.    Marland Kitchen levothyroxine (SYNTHROID) 100 MCG tablet Take 1 tablet (100 mcg total) by mouth daily. 90 tablet 3  . magnesium oxide (MAG-OX) 400 MG tablet Take 400 mg by mouth daily.    . Multiple Vitamins-Minerals (ZINC PO) Take 1 tablet by mouth.    . pravastatin (PRAVACHOL) 40 MG tablet TAKE 1 TABLET BY MOUTH  DAILY 90 tablet 3  . rOPINIRole (REQUIP) 0.5 MG tablet TAKE 2 TABLETS BY MOUTH  AFTER DINNER EACH NIGHT. 180 tablet 3  . vitamin B-12 (CYANOCOBALAMIN) 500 MCG tablet Take 500 mcg by mouth daily.     No facility-administered medications prior to visit.      Physical Exam See Research Exam     BNP No results found for: BNP  ProBNP No  results found for: PROBNP  Imaging: CT Chest High Resolution  Result Date: 05/13/2020 CLINICAL DATA:  Idiopathic pulmonary fibrosis on research study, [employer] protocol. EXAM: CT CHEST WITHOUT CONTRAST TECHNIQUE: Multidetector CT imaging of the chest was performed following the standard protocol without intravenous contrast. High resolution imaging of the lungs, as well as inspiratory and expiratory imaging, was performed. COMPARISON:  03/23/2020 high-resolution chest CT. FINDINGS: Cardiovascular: Normal heart size. No significant pericardial effusion/thickening. Left anterior descending coronary atherosclerosis. Atherosclerotic nonaneurysmal  thoracic aorta. Top-normal caliber main pulmonary artery (3.2 cm diameter). Mediastinum/Nodes: No discrete thyroid nodules. Unremarkable esophagus. No axillary adenopathy. Mildly enlarged 1.1 cm lower right paratracheal node (series 3/image 53), stable. Stable mildly enlarged 1.3 cm subcarinal node (series 3/image 58). No discrete hilar adenopathy on these noncontrast images. Lungs/Pleura: No pneumothorax. No pleural effusion. No acute consolidative airspace disease, lung masses or significant pulmonary nodules. No significant air trapping or evidence of tracheobronchomalacia on the expiration sequence. Moderate patchy confluent subpleural reticulation and ground-glass opacity throughout both lungs with associated mild traction bronchiectasis and architectural distortion. No clear apicobasilar gradient to these findings. No frank honeycombing. No appreciable interval progression since recent 03/23/2020 chest CT. Upper abdomen: Small hiatal hernia. Musculoskeletal: No aggressive appearing focal osseous lesions. Patchy chronic sclerosis throughout the thoracic spine, unchanged. Moderate thoracic spondylosis. IMPRESSION: 1. Spectrum of findings compatible with fibrotic interstitial lung disease without frank honeycombing, without craniocaudal gradient and without appreciable interval progression since recent 03/23/2020 chest CT. Differential includes fibrotic NSIP (favored on prior interpretations based on ground-glass predominance on baseline 2014 scan) versus UIP. Findings are indeterminate for UIP per consensus guidelines: Diagnosis of Idiopathic Pulmonary Fibrosis: An Official ATS/ERS/JRS/ALAT Clinical Practice Guideline. [place], Iss 5, [id number], Sep 22 2016. 2. Stable mild mediastinal lymphadenopathy, most compatible with benign reactive adenopathy. 3. One vessel coronary atherosclerosis. 4. Small hiatal hernia. 5. Aortic Atherosclerosis (ICD10-I70.0). Electronically Signed   By: Ilona Sorrel M.D.   On: 05/13/2020 17:17      PFT Results Latest Ref Rng & Units 03/04/2020 09/15/2019 03/16/2019 01/29/2018  FVC-Pre L 3.07 2.92 2.91 2.97  FVC-Predicted Pre % 104 99 97 98  FVC-Post L - - - 2.89  FVC-Predicted Post % - - - 95  Pre FEV1/FVC % % 78 80 79 81  Post FEV1/FCV % % - - - 83  FEV1-Pre L 2.39 2.34 2.29 2.40  FEV1-Predicted Pre % 108 106 102 105  FEV1-Post L - - - 2.39  DLCO uncorrected ml/min/mmHg 12.94 12.84 12.32 14.80  DLCO UNC% % 63 63 60 54  DLCO corrected ml/min/mmHg 12.94 13.26 12.91 -  DLCO COR %Predicted % 63 65 63 -  DLVA Predicted % 70 71 73 68    No results found for: NITRICOXIDE      Assessment & Plan:   No problem-specific Assessment & Plan notes found for this encounter.     Rexene Edison, NP 06/01/2020

## 2020-06-01 NOTE — Assessment & Plan Note (Signed)
Appears stable continue current regimen 

## 2020-06-13 DIAGNOSIS — Z79899 Other long term (current) drug therapy: Secondary | ICD-10-CM | POA: Diagnosis not present

## 2020-06-13 DIAGNOSIS — L661 Lichen planopilaris: Secondary | ICD-10-CM | POA: Diagnosis not present

## 2020-06-14 ENCOUNTER — Encounter (INDEPENDENT_AMBULATORY_CARE_PROVIDER_SITE_OTHER): Payer: Medicare Other | Admitting: Internal Medicine

## 2020-06-14 ENCOUNTER — Encounter: Payer: Medicare Other | Admitting: *Deleted

## 2020-06-14 ENCOUNTER — Other Ambulatory Visit: Payer: Self-pay

## 2020-06-14 DIAGNOSIS — R61 Generalized hyperhidrosis: Secondary | ICD-10-CM

## 2020-06-14 DIAGNOSIS — J84112 Idiopathic pulmonary fibrosis: Secondary | ICD-10-CM

## 2020-06-14 DIAGNOSIS — Z006 Encounter for examination for normal comparison and control in clinical research program: Secondary | ICD-10-CM

## 2020-06-14 NOTE — Progress Notes (Signed)
Title: A randomized, double-blind, multicenter, dose-ranging, placebo-controlled Phase 2a evaluation of the safety, tolerability and pharmacokinetics of [id number] in participants with idiopathic pulmonary fibrosis (IPF)   Patients will be randomized in a 3:1 ratio to one of two treatment arms:  320 mg [id number] or placebo.   Protocol #: [id number], Clinical Trials EUDRACT# 2019-002709-23  ** Sponsor [employer] [email or web address]   ([place] [address])    Key Inclusion Criteria:   Age >/= 79 years old Confident diagnosis of IPF  up to 5 years. If IPF diagnosis is within > 3 to = 5 years at screening, the participant must have evidence of progression within the last 24 months, as defined by decline in FVC percent predicted based on a relative decline of = 5% FVC% pred >/=45%  DLCO (hgb-adjusted) >/=30% Treatment with nintedanib or pirfenidone allowed, But must have stable dose =3 months before Screening Visit and remain unchanged during the study   Key Exclusion Criteria:   Receiving any non-approved agent intended for treatment of fibrosis in IPF FEV1 over the FVC ratio < 0.7 at screening Active infection,  that can affect FVC measurement during Screening or  Randomization Extent of emphysema > fibrotic changes on most recent HRCT IPF exacerbation within 6 months of Screening  Severe pulmonary hypertension Smoking of any kind w/in 3 months of Screening or unwilling to avoid smoking throughout the study Renal impairment or end-stage kidney disease requiring dialysis History of unstable /deteriorating cardiac/pulmonary disease (other than IPF) w/in 6 months of Screening Any ongoing known malignancy, except for localized cancers (i.e. basal cell carcinoma) Medical or surgical condition known to affect drug absorption Surgeries scheduled during the study period    Side-Effects: [id number] was generally well tolerated in over 280 healthy participants with no  clinical safety concerns or dose-limiting toxicities observed for single doses up to 685m and multiple doses up to 3291mQD administered for up to 14 days. The major side-effects reported have been headache (mild) and constipation (mild) in 3-76% of subjects. Other side effects, in 1-2% of subjects, were mild to moderate, with fever being the only severe side-effect reported.  Mechanism: Selective dual aV6 and aV1 integrin inhibitor with antifibrotic activity. [id number]-44010revents aV6 and aV1 from binding to the RGD sequence of latency-associated peptide (LAP), this blocks the release of activated TGF-1, thereby blocking the activation of pro-fibrotic pathways. Additionally, PLUVO-53664as been shown to reduce collagen synthesis and gene expression in human IPF tissue.   Interactions: Acts as a substrate of P-gp, OATP1B1, OATP1B3, OAT3 Inhibitors or inducers of these enzymes may affect the intestinal absorption, hepatic uptake or renal uptake of PLQIH-47425etabolized by CYP3A4, CYP2C9, CYP2C19 Potent inhibitors or inducers of these enzymes should be avoided Each CYP enzyme metabolizes < 10% of the parent compound Since multiple CYP enzymes are involved, clinically relevant drug-drug interactions are less likely, as there are other alternative pathways A potent inhibitor of multiple pathways should be used with caution as that has the potential to elicit a clinically relevant drug-drug interaction i.e. medication that inhibits CYP3A4, CYP2C9, CYP2C19 PLZDG-38756s not an inducer of CYP1A2, CYP2B6, or CYP3A4 Showed moderate inhibition of P-gp and MATE1, substrates of P-gp and MATE1 may  have increased exposure when coadministered with [id number]-29518etabolized by extrahepatic enzymes: CYP1A1, UGT1A7 Clinical relevance relating to drug interactions is unknown Stable when incubated with human recombinant MAO-A and MAO-B PLACZ-66063s not an inhibitor or inducer of CYP isoforms and may be co-administered  with substrates of CYP isoforms  without risk of DDI.  Interactions with specific drugs: Potential for increased exposure of nintedanib Recommend separating time of administration of OOI-75797 and nintedanib Drug interactions with pirfenidone are not expected Use of corticosteroids should be discussed beforehand with the medical monitor   Clinical drug interactions studies have not been performed at this time and therefore guidance is based on the in-vitro studies  Disallowed Medications/Foods/Supplements: Fluconazole Digoxin Rifampin Grapefruit Grapefruit-containing foods and beverages St. John's Wort   Pharmacokinetics: Tmax : 2-3 hours Half-life: approximately 48 hours Steady state reached in 5-7 days Minimal renal clearance (8.5% of drug is cleared by the kidneys)    Administration [phone] should be administered on an empty stomach May be administered via feeding tube   Safety:   Edition Addendum to Edition 5.0 - 12 February 2020   Animal studies: No effect on neurobehavior No effect on respiratory parameters No effects on blood pressure Showed an increase in QT and QTc The high dose 400 mg/kg showed an increase in mean heart rate   Cardiovascular effects in mice after administration of IFB-37943: Dose-dependent increases in blood pressure Increase in PR and QRS duration Increase in arrhythmias  Reproductive animal toxicology studies at the low and medium doses, 150 and 300 mg/kg/day, revealed no impact on: maternal survival, fetal body weight, food parameters, uterine and ovarian parameters, no external malformations  When the high dose 600 mg/kg/day was tested in rabbits, the rabbits displayed increased morbidities and increased abortions   Phase 1 studies:   Type of TEAE [id number]-14709 n=32 Placebo n=14 GI disorders 7 (22%) 0 Infections 3 (9%) 0 Musculoskeletal 1 (3%) 0 Nervous system disorders 2 (6%) 0   Most common treatment-emergent adverse  event (TEAE):  headache  The most common drug-related adverse events were: nausea, diarrhea, abdominal distension. These occurred in 12.5% of participants  There were no deaths or serious adverse events in any of the Phase I studies There were no clinically meaningful changes in vital signs, telemetry or laboratory parameters [phone] did not display any clinically significant effect on ECG (iIncluding Qtc, HR, RR, PR and QRS)  Study [id number] All TEAEs were mild and deemed unrelated to study drug One TEAE, viral infection, led to discontinuation of IP No deaths nor SAEs  As of the 23 October 2019 cutoff date, preliminary data from ongoing and discontinued Phase 2a studies suggest no new safety concerns.  All SAE's reported to date were considered unrelated to study drug by the investigator. All treatment-emergent SAE's were known manifestations of the underlying  diseases (IPF, PSC or ARDS) and considered by the investigator to be most likely due to disease progresssion.  Part D of the study will evaluate approximately 28 participants at the 320 mg dose for at least 24 weeks to further characterize the safety profile of [id number]-37543. Ongoing safety and FVC assessments will continue until the last participant reaches 24 weeks of the study; therefore, safety and FVC assessments may extend to 48 weeks in participants randomized earlier in the study.   xxxxxxxxxxxxxxxxxxxxxxxxxxxxxx   This visit for Subject Elizabeth Espinoza with DOB: 08/27/1940 on 06/14/2020 for the above protocol is Visit/Encounter # Visiti 3 and 2 weeeks into IP since randomization  and is for purpose of reserach . Subject/LAR expressed continued interest and consent in continuing as a study subject. Subject thanked for participation in research and contribution to science.  Overall stable but for the last 10 days [1 and half weeks] she is reporting new onset of night sweats.  This  is mild.  It happens every night.  It is a  summer season here and the night are quite hot but she tells me that she runs the air conditioning.  Last night was cool because of the rain and she still had night sweats.  Therefore it is unrelated to the weather.  She wakes up 3 hours into her sleep and has sweat around her neck and she removes her blanket and that she is able to sleep again.  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  No medication has been taken.  Definitely started after randomization and it is new.  No other side effects.   Objective   - Exam done and is in the paper record of the research.  There is no change in baseline exam  A -Research subject for IPF - Night sweats: Adverse event.  Mild severity.  Likely related to study drug  Plan   - Discussed about continuing on the research protocol: She is continues to be interested.  The side effects are mild.  We can continue to watch this. - She will continue with other safety monitoring today with labs.  We will review this   SIGNATURE    Dr. Brand Males, M.D., F.C.C.P, ACRP-CPI [facility] and [place], [facility] Staff Physician, [place] Director - Interstitial Lung Disease  Program  [facility] [place], [place], [address]   Pager: [phone], If no answer  OR between  19:00-7:00h: page [phone] Telephone (research): [phone]  8:58 AM 06/14/2020   8:58 AM 06/14/2020

## 2020-06-14 NOTE — Research (Signed)
Title: A randomized, double-blind, multicenter, dose-ranging, placebo-controlled Phase 2a evaluation of the safety, tolerability and pharmacokinetics of [id number] in participants with idiopathic pulmonary fibrosis (IPF)   Clinical Research Coordinator / Research RN note : This visit for Subject Elizabeth Espinoza with DOB: [birth year] on 06/14/2020 for the above protocol is Visit #3  and is for purpose of research.   The consent for this encounter is under Protocol Version Amendment 3, Investigator Brochure Edition v5.1 Addendum, Consent Version 5.1 and is currently IRB approved.   Subject expressed continued interest and consent in continuing as a study subject. Subject confirmed that there was no change in contact information (e.g. address, telephone, email). Subject thanked for participation in research and contribution to science.  In this visit 06/14/2020 the subject will be evaluated by investigator named Dr. Chase Caller  . This research coordinator has verified that the investigator is up to date with his training logs.  All study visit 3 procedures and assessments were completed per the above named protocol. A new AE of Night Sweats was reported by the subject, beginning 10 days ago. This is new for her. She states that this is mild and she is able to go back to sleep. No other AE's reported. She is 100% compliant with study medication and diary completion. Please refer to the subject's study binder for further details of the visit. She will return in 2 weeks for study visit 4.  Signed by Hale Drone, MS, [place] / Nurse [place], [place] 1:47 PM 06/14/2020

## 2020-06-15 ENCOUNTER — Encounter: Payer: Medicare Other | Admitting: Internal Medicine

## 2020-06-28 ENCOUNTER — Encounter (INDEPENDENT_AMBULATORY_CARE_PROVIDER_SITE_OTHER): Payer: Medicare Other | Admitting: Internal Medicine

## 2020-06-28 ENCOUNTER — Encounter: Payer: Medicare Other | Admitting: *Deleted

## 2020-06-28 ENCOUNTER — Other Ambulatory Visit: Payer: Self-pay

## 2020-06-28 DIAGNOSIS — J84112 Idiopathic pulmonary fibrosis: Secondary | ICD-10-CM

## 2020-06-28 DIAGNOSIS — Z006 Encounter for examination for normal comparison and control in clinical research program: Secondary | ICD-10-CM

## 2020-06-28 NOTE — Research (Signed)
Title: A randomized, double-blind, multicenter, dose-ranging, placebo-controlled Phase 2a evaluation of the safety, tolerability and pharmacokinetics of [id number] in participants with idiopathic pulmonary fibrosis (IPF)   Clinical Research Coordinator / Research RN note : This visit for Subject Elizabeth Espinoza with DOB: [birth year] on 06/28/2020 for the above protocol is Visit #4 and is for purpose of research.   The consent for this encounter is under Protocol Version Amendment 3.0 , Investigator Brochure Edition v. 5.0 Addendum, Consent Version 5.1 and is currently IRB approved.   Subject expressed continued interest and consent in continuing as a study subject. Subject confirmed that there was no change in contact information (e.g. address, telephone, email). Subject thanked for participation in research and contribution to science.  In this visit 06/28/2020 the subject will be evaluated by investigator named Dr. Chase Caller. This research coordinator has verified that the investigator is up to date with his training logs.  This visit is under direct supervision of the PI, Dr.Ramaswamy. All study visit 4 procedures and assessments were completed per the above mentioned protocol. No new AE's were reported. The AE of Night Sweats reported at the last visit is resolved as of [id number]. She has not experienced any further night sweats in the past week. The subject is 100% compliant with taking study medication and completing the paper diary. Refer to the subject's study binder for further details of the visit. She will return in 4 weeks for study visit 5.  Signed by Hale Drone, MS, [place] Coordinator  [place], [place] 1:23 PM 06/28/2020

## 2020-07-05 ENCOUNTER — Other Ambulatory Visit: Payer: Self-pay

## 2020-07-05 NOTE — Patient Instructions (Addendum)
Blood work was ordered.     Medications changes include :   none     Please followup in 1 year    Health Maintenance, Female Adopting a healthy lifestyle and getting preventive care are important in promoting health and wellness. Ask your health care provider about: The right schedule for you to have regular tests and exams. Things you can do on your own to prevent diseases and keep yourself healthy. What should I know about diet, weight, and exercise? Eat a healthy diet  Eat a diet that includes plenty of vegetables, fruits, low-fat dairy products, and lean protein. Do not eat a lot of foods that are high in solid fats, added sugars, or sodium.  Maintain a healthy weight Body mass index (BMI) is used to identify weight problems. It estimates body fat based on height and weight. Your health care provider can help determineyour BMI and help you achieve or maintain a healthy weight. Get regular exercise Get regular exercise. This is one of the most important things you can do for your health. Most adults should: Exercise for at least 150 minutes each week. The exercise should increase your heart rate and make you sweat (moderate-intensity exercise). Do strengthening exercises at least twice a week. This is in addition to the moderate-intensity exercise. Spend less time sitting. Even light physical activity can be beneficial. Watch cholesterol and blood lipids Have your blood tested for lipids and cholesterol at 79 years of age, then havethis test every 5 years. Have your cholesterol levels checked more often if: Your lipid or cholesterol levels are high. You are older than 79 years of age. You are at high risk for heart disease. What should I know about cancer screening? Depending on your health history and family history, you may need to have cancer screening at various ages. This may include screening for: Breast cancer. Cervical cancer. Colorectal cancer. Skin cancer. Lung  cancer. What should I know about heart disease, diabetes, and high blood pressure? Blood pressure and heart disease High blood pressure causes heart disease and increases the risk of stroke. This is more likely to develop in people who have high blood pressure readings, are of African descent, or are overweight. Have your blood pressure checked: Every 3-5 years if you are 54-48 years of age. Every year if you are 72 years old or older. Diabetes Have regular diabetes screenings. This checks your fasting blood sugar level. Have the screening done: Once every three years after age 23 if you are at a normal weight and have a low risk for diabetes. More often and at a younger age if you are overweight or have a high risk for diabetes. What should I know about preventing infection? Hepatitis B If you have a higher risk for hepatitis B, you should be screened for this virus. Talk with your health care provider to find out if you are at risk forhepatitis B infection. Hepatitis C Testing is recommended for: Everyone born from [birth year] through 1964. Anyone with known risk factors for hepatitis C. Sexually transmitted infections (STIs) Get screened for STIs, including gonorrhea and chlamydia, if: You are sexually active and are younger than 79 years of age. You are older than 79 years of age and your health care provider tells you that you are at risk for this type of infection. Your sexual activity has changed since you were last screened, and you are at increased risk for chlamydia or gonorrhea. Ask your health care provider if you are  at risk. Ask your health care provider about whether you are at high risk for HIV. Your health care provider may recommend a prescription medicine to help prevent HIV infection. If you choose to take medicine to prevent HIV, you should first get tested for HIV. You should then be tested every 3 months for as long as you are taking the medicine. Pregnancy If you are about  to stop having your period (premenopausal) and you may become pregnant, seek counseling before you get pregnant. Take 400 to 800 micrograms (mcg) of folic acid every day if you become pregnant. Ask for birth control (contraception) if you want to prevent pregnancy. Osteoporosis and menopause Osteoporosis is a disease in which the bones lose minerals and strength with aging. This can result in bone fractures. If you are 12 years old or older, or if you are at risk for osteoporosis and fractures, ask your health care provider if you should: Be screened for bone loss. Take a calcium or vitamin D supplement to lower your risk of fractures. Be given hormone replacement therapy (HRT) to treat symptoms of menopause. Follow these instructions at home: Lifestyle Do not use any products that contain nicotine or tobacco, such as cigarettes, e-cigarettes, and chewing tobacco. If you need help quitting, ask your health care provider. Do not use street drugs. Do not share needles. Ask your health care provider for help if you need support or information about quitting drugs. Alcohol use Do not drink alcohol if: Your health care provider tells you not to drink. You are pregnant, may be pregnant, or are planning to become pregnant. If you drink alcohol: Limit how much you use to 0-1 drink a day. Limit intake if you are breastfeeding. Be aware of how much alcohol is in your drink. In the [place] one drink equals one 12 oz bottle of beer (355 mL), one 5 oz glass of wine (148 mL), or one 1 oz glass of hard liquor (44 mL). General instructions Schedule regular health, dental, and eye exams. Stay current with your vaccines. Tell your health care provider if: You often feel depressed. You have ever been abused or do not feel safe at home. Summary Adopting a healthy lifestyle and getting preventive care are important in promoting health and wellness. Follow your health care provider's instructions about  healthy diet, exercising, and getting tested or screened for diseases. Follow your health care provider's instructions on monitoring your cholesterol and blood pressure. This information is not intended to replace advice given to you by your health care provider. Make sure you discuss any questions you have with your healthcare provider. Document Revised: 01/01/2018 Document Reviewed: 01/01/2018 Elsevier Patient Education  2022 [employer].

## 2020-07-05 NOTE — Progress Notes (Signed)
Subjective:    Patient ID: Elizabeth Espinoza, female    DOB: May 18, 1940, 80 y.o.   MRN: [id number]   This visit occurred during the SARS-CoV-2 public health emergency.  Safety protocols were in place, including screening questions prior to the visit, additional usage of staff PPE, and extensive cleaning of exam room while observing appropriate contact time as indicated for disinfecting solutions.    HPI She is here for a physical exam.   She is in a new study for her ILD - it is a blind study.  She is not sure if she is on the statin medication or placebo.  She thinks she does feel better.  She is trying to exercise as much as possible.  Medications and allergies reviewed with patient and updated if appropriate.  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   Bilateral shoulder pain 11/18/2018   Bony sclerosis 10/21/2018   Chronic cough 01/29/2018   Acute sinusitis 01/02/2018   Rash and nonspecific skin eruption 07/30/2016   Prediabetes 02/24/2015   Dyspnea on exertion 09/29/2014   ILD (interstitial lung disease) ([place]) 12/11/2012   Barrett's esophagus 12/11/2012   RLS (restless legs syndrome) 04/13/2011   ARTHRALGIA 09/01/2008   HIATAL HERNIA 07/31/2007   History of cardiovascular disorder 07/31/2007   Allergic rhinitis 06/19/2007   GASTROESOPHAGEAL REFLUX DISEASE, CHRONIC 06/19/2007   Hypothyroidism 02/07/2007   HYPERLIPIDEMIA 02/07/2007    Current Outpatient Medications on File Prior to Visit  Medication Sig Dispense Refill   aspirin EC 81 MG tablet Take 1 tablet (81 mg total) by mouth daily. 90 tablet 3   cetirizine (ZYRTEC) 10 MG tablet Take 10 mg by mouth daily.      cholecalciferol (VITAMIN D3) 25 MCG (1000 UT) tablet Take 1,000 Units by mouth daily.     clobetasol (TEMOVATE) 0.05 % external solution SMARTSIG:1 Milliliter(s) Topical  Twice Daily     esomeprazole (NEXIUM) 40 MG capsule Take 40 mg by mouth every morning.     famotidine (PEPCID) 40 MG tablet Take 1 tablet (40 mg total) by mouth daily. 90 tablet 1   fluticasone (FLONASE) 50 MCG/ACT nasal spray Place 1 spray into both nostrils daily as needed for allergies or rhinitis.     hydroxychloroquine (PLAQUENIL) 200 MG tablet Take 200 mg by mouth 2 (two) times daily.     levothyroxine (SYNTHROID) 100 MCG tablet Take 1 tablet (100 mcg total) by mouth daily. 90 tablet 3   magnesium oxide (MAG-OX) 400 MG tablet Take 400 mg by mouth daily.     Multiple Vitamins-Minerals (ZINC PO) Take 1 tablet by mouth.     pravastatin (PRAVACHOL) 40 MG tablet TAKE 1 TABLET BY MOUTH  DAILY 90 tablet 3   rOPINIRole (REQUIP) 0.5 MG tablet TAKE 2 TABLETS BY MOUTH  AFTER DINNER EACH NIGHT. 180 tablet 3   vitamin B-12 (CYANOCOBALAMIN) 500 MCG tablet Take 500 mcg by mouth daily.     [DISCONTINUED] loratadine (CLARITIN) 10 MG tablet Take 10 mg by mouth daily as needed.       [DISCONTINUED] omeprazole (PRILOSEC) 40 MG capsule Take 1 capsule (40 mg total) by mouth daily. 90 capsule 0   No current facility-administered medications on file prior to visit.    Past Medical History:  Diagnosis Date   Allergic rhinitis    GERD (gastroesophageal reflux disease)    Glaucoma  SUSPECT   Hiatal hernia    Hyperlipidemia    LDL goal = < 100   Hypothyroidism    ILD (interstitial lung disease) ([facility])    Lichen planopilaris    MVP (mitral valve prolapse)    documented on 2 D ECHO   Restless legs    Urticaria     Past Surgical History:  Procedure Laterality Date   APPENDECTOMY     with [place]   negative   COLONOSCOPY  2005   Dr Olevia Perches   COLONOSCOPY  01/2013   Dr Collene Mares   esophageal dilation  07/04/2011   Dr Estrella Myrtle SIGMOIDOSCOPY  1999    Dr Olevia Perches   KNEE ARTHROSCOPY  2001, 2005   Dr Percell Miller   bilat   TOTAL ABDOMINAL HYSTERECTOMY     for Endometriosis;  no BSO, Dr Colin Ina   UPPER GI ENDOSCOPY  01/2013   Dr Collene Mares   VIDEO BRONCHOSCOPY Bilateral 02/05/2019   Procedure: VIDEO BRONCHOSCOPY WITH FLUORO;  Surgeon: Brand Males, MD;  [facility];  Service: Endoscopy;  Laterality: Bilateral;    Social History   Socioeconomic History   Marital status: Married    Spouse name: Jeneen Rinks   Number of children: 2   Years of education: Not on file   Highest education level: Not on file  Occupational History   Occupation: retired from [employer] retired  Tobacco Use   Smoking status: Never   Smokeless tobacco: Never  Vaping Use   Vaping Use: Never used  Substance and Sexual Activity   Alcohol use: Yes    Comment:  rarely   Drug use: No   Sexual activity: Yes  Other Topics Concern   Not on file  Social History Narrative   Not on file   Social Determinants of Health   Financial Resource Strain: Not on file  Food Insecurity: Not on file  Transportation Needs: Not on file  Physical Activity: Not on file  Stress: Not on file  Social Connections: Not on file    Family History  Problem Relation Age of Onset   Lung cancer Father        Asbestos with mesothelioma   Heart disease Paternal Grandmother        ? etiology   Diabetes Paternal Grandmother    Diabetes Brother    Uterine cancer Paternal Aunt    Stroke Paternal Grandfather        > 55   Heart attack Brother 67   Allergies Sister    Aneurysm Mother        congenital   Stroke Brother        two strokes   Barrett's esophagus Brother     Review of Systems  Constitutional:  Negative for chills and fever.  Eyes:  Negative for visual disturbance.  Respiratory:  Positive for shortness of breath. Negative for cough and wheezing.   Cardiovascular:  Negative for chest pain, palpitations and leg swelling.  Gastrointestinal:  Negative for abdominal pain, blood in stool, constipation, diarrhea and nausea.       Gerd occ - controlled  Genitourinary:  Negative for  dysuria.  Musculoskeletal:  Positive for arthralgias (mild). Negative for back pain.  Skin:  Positive for rash (sees derm).  Neurological:  Positive for light-headedness (occ). Negative for headaches.  Psychiatric/Behavioral:  Negative for dysphoric mood. The patient is not nervous/anxious.       Objective:   Vitals:  07/06/20 0930  BP: 126/72  Pulse: 60  Temp: 98.2 F (36.8 C)  SpO2: 99%   Filed Weights   07/06/20 0930  Weight: 141 lb 6.4 oz (64.1 kg)   Body mass index is 22.82 kg/m.  BP Readings from Last 3 Encounters:  07/06/20 126/72  05/10/20 128/66  03/04/20 122/72    Wt Readings from Last 3 Encounters:  07/06/20 141 lb 6.4 oz (64.1 kg)  05/10/20 144 lb (65.3 kg)  03/04/20 144 lb (65.3 kg)     Physical Exam Constitutional: She appears well-developed and well-nourished. No distress.  HENT:  Head: Normocephalic and atraumatic.  Right Ear: External ear normal. Normal ear canal and TM Left Ear: External ear normal.  Normal ear canal and TM Mouth/Throat: Oropharynx is clear and moist.  Eyes: Conjunctivae and EOM are normal.  Neck: Neck supple. No tracheal deviation present. No thyromegaly present.  No carotid bruit  Cardiovascular: Normal rate, regular rhythm and normal heart sounds.   No murmur heard.  No edema. Pulmonary/Chest: Effort normal and breath sounds normal. No respiratory distress. She has no wheezes. She has no rales.  Breast: deferred   Abdominal: Soft. She exhibits no distension. There is no tenderness.  Lymphadenopathy: She has no cervical adenopathy.  Skin: Skin is warm and dry. She is not diaphoretic.  Psychiatric: She has a normal mood and affect. Her behavior is normal.        Assessment & Plan:   Physical exam: Screening blood work    ordered Immunizations  up to date Colonoscopy  n/a due to age 21  Up to date  Dexa  Up to date  Eye exams  Up to date  Exercise   walking, rides bike sometimes Weight    normal Substance  abuse  none      See Problem List for Assessment and Plan of chronic medical problems.

## 2020-07-06 ENCOUNTER — Ambulatory Visit (INDEPENDENT_AMBULATORY_CARE_PROVIDER_SITE_OTHER): Payer: Medicare Other | Admitting: Internal Medicine

## 2020-07-06 ENCOUNTER — Encounter: Payer: Self-pay | Admitting: Internal Medicine

## 2020-07-06 ENCOUNTER — Other Ambulatory Visit: Payer: Self-pay

## 2020-07-06 VITALS — BP 126/72 | HR 60 | Temp 98.2°F | Ht 66.0 in | Wt 141.4 lb

## 2020-07-06 DIAGNOSIS — I700 Atherosclerosis of aorta: Secondary | ICD-10-CM | POA: Insufficient documentation

## 2020-07-06 DIAGNOSIS — L661 Lichen planopilaris: Secondary | ICD-10-CM | POA: Diagnosis not present

## 2020-07-06 DIAGNOSIS — E039 Hypothyroidism, unspecified: Secondary | ICD-10-CM | POA: Diagnosis not present

## 2020-07-06 DIAGNOSIS — R7303 Prediabetes: Secondary | ICD-10-CM

## 2020-07-06 DIAGNOSIS — G2581 Restless legs syndrome: Secondary | ICD-10-CM

## 2020-07-06 DIAGNOSIS — Z0000 Encounter for general adult medical examination without abnormal findings: Secondary | ICD-10-CM

## 2020-07-06 DIAGNOSIS — Z1159 Encounter for screening for other viral diseases: Secondary | ICD-10-CM

## 2020-07-06 DIAGNOSIS — E782 Mixed hyperlipidemia: Secondary | ICD-10-CM

## 2020-07-06 DIAGNOSIS — L439 Lichen planus, unspecified: Secondary | ICD-10-CM

## 2020-07-06 LAB — CBC WITH DIFFERENTIAL/PLATELET
Basophils Absolute: 0.1 10*3/uL (ref 0.0–0.1)
Basophils Relative: 1.3 % (ref 0.0–3.0)
Eosinophils Absolute: 0.5 10*3/uL (ref 0.0–0.7)
Eosinophils Relative: 4.8 % (ref 0.0–5.0)
HCT: 39.3 % (ref 36.0–46.0)
Hemoglobin: 13.1 g/dL (ref 12.0–15.0)
Lymphocytes Relative: 30.1 % (ref 12.0–46.0)
Lymphs Abs: 3.2 10*3/uL (ref 0.7–4.0)
MCHC: 33.3 g/dL (ref 30.0–36.0)
MCV: 86.4 fl (ref 78.0–100.0)
Monocytes Absolute: 1 10*3/uL (ref 0.1–1.0)
Monocytes Relative: 9.8 % (ref 3.0–12.0)
Neutro Abs: 5.8 10*3/uL (ref 1.4–7.7)
Neutrophils Relative %: 54 % (ref 43.0–77.0)
Platelets: 216 10*3/uL (ref 150.0–400.0)
RBC: 4.55 Mil/uL (ref 3.87–5.11)
RDW: 15.1 % (ref 11.5–15.5)
WBC: 10.7 10*3/uL — ABNORMAL HIGH (ref 4.0–10.5)

## 2020-07-06 LAB — COMPREHENSIVE METABOLIC PANEL
ALT: 21 U/L (ref 0–35)
AST: 26 U/L (ref 0–37)
Albumin: 4.6 g/dL (ref 3.5–5.2)
Alkaline Phosphatase: 82 U/L (ref 39–117)
BUN: 10 mg/dL (ref 6–23)
CO2: 26 mEq/L (ref 19–32)
Calcium: 9.7 mg/dL (ref 8.4–10.5)
Chloride: 105 mEq/L (ref 96–112)
Creatinine, Ser: 0.76 mg/dL (ref 0.40–1.20)
GFR: 74.65 mL/min (ref >60.00)
Glucose, Bld: 90 mg/dL (ref 70–99)
Potassium: 4.3 mEq/L (ref 3.5–5.1)
Sodium: 141 mEq/L (ref 135–145)
Total Bilirubin: 0.7 mg/dL (ref 0.2–1.2)
Total Protein: 7.4 g/dL (ref 6.0–8.3)

## 2020-07-06 LAB — HEMOGLOBIN A1C: Hgb A1c MFr Bld: 6 % (ref 4.6–6.5)

## 2020-07-06 LAB — VITAMIN D 25 HYDROXY (VIT D DEFICIENCY, FRACTURES): VITD: 35.05 ng/mL (ref 30.00–100.00)

## 2020-07-06 LAB — TSH: TSH: 0.44 u[IU]/mL (ref 0.35–4.50)

## 2020-07-06 NOTE — Assessment & Plan Note (Signed)
Chronic Check lipid panel  Continue pravastatin 40 mg daily Regular exercise and healthy diet encouraged

## 2020-07-06 NOTE — Assessment & Plan Note (Signed)
Chronic Check a1c Low sugar / carb diet Stressed regular exercise   

## 2020-07-06 NOTE — Patient Instructions (Signed)
Research subject IPF  Plan  - per protocol

## 2020-07-06 NOTE — Assessment & Plan Note (Signed)
Chronic LDL at goal Continue pravastatin 40 mg daily continue healthy diet and regular exercise  Lab Results  Component Value Date   LDLCALC 63 05/12/2020

## 2020-07-06 NOTE — Assessment & Plan Note (Signed)
Chronic Management per Dr Fontaine No On plaquenil Check iron panel, vitamin D  tsh normal

## 2020-07-06 NOTE — Progress Notes (Signed)
Title: A randomized, double-blind, multicenter, dose-ranging, placebo-controlled Phase 2a evaluation of the safety, tolerability and pharmacokinetics of [id number] in participants with idiopathic pulmonary fibrosis (IPF)   Patients will be randomized in a 3:1 ratio to one of two treatment arms:  320 mg [id number] or placebo.   Protocol #: [id number], Clinical Trials EUDRACT# 2019-002709-23  ** Sponsor [employer] [email or web address]   ([place] [address])  .  Part D of the study will evaluate approximately 28 participants at the 320 mg dose for at least 24 weeks to further characterize the safety profile of [id number]. Ongoing safety and FVC assessments will continue until the last participant reaches 24 weeks of the study; therefore, safety and FVC assessments may extend to 48 weeks in participants randomized earlier in the study.     X[id number] This visit for Subject Elizabeth Espinoza with DOB: 1940/11/16 on 07/06/2020 for the above protocol is Visit/Encounter # 4  and is for purpose of research . Subject/LAR expressed continued interest and consent in continuing as a study subject. Subject thanked for participation in research and contribution to science.    Study procedures completed per protocol    SIGNATURE    Dr. Brand Males, M.D., F.C.C.P, ACRP-CPI Pulmonary and Critical Care Medicine Research Investigator, [facility] [place] Staff Physician, [place] Director - Interstitial Lung Disease  Program  Pulmonary [place] [facility] and [facility] @ [place], [place], [address]   Pager: [phone], If no answer  OR between  19:00-7:00h: page [phone] Telephone (research): [phone]  6:43 AM 07/06/2020   6:43 AM 07/06/2020

## 2020-07-06 NOTE — Assessment & Plan Note (Signed)
Chronic Controlled, stable Continue requp 0.5 mg  - 2 tabs nightly

## 2020-07-06 NOTE — Progress Notes (Signed)
Title: A randomized, double-blind, multicenter, dose-ranging, placebo-controlled Phase 2a evaluation of the safety, tolerability and pharmacokinetics of [id number] in participants with idiopathic pulmonary fibrosis (IPF)   Patients will be randomized in a 3:1 ratio to one of two treatment arms:  320 mg [id number] or placebo.   Protocol #: [id number], Clinical Trials EUDRACT# 2019-002709-23  ** Sponsor [employer] [email or web address]   ([place] [address])   Xxxx This visit for Subject Elizabeth Espinoza with DOB: Feb 15, 1940 on 07/06/2020 for the above protocol is Visit/Encounter # researc  and is for purpose of research Subject/LAR expressed  interest and consent in continuing as a study subject. Subject thanked for participation in research and contribution to science.    Procedures done per protocool after consent  This is for epic EMR documenttion of event    SIGNATURE    Dr. Brand Males, M.D., F.C.C.P, ACRP-CPI Pulmonary and Critical Care Medicine Research Investigator, [facility] [place] Staff Physician, [place] Director - Interstitial Lung Disease  Program  Pulmonary [place] [facility] and [facility] @ [place], [place], [address]   Pager: [phone], If no answer  OR between  19:00-7:00h: page [phone] Telephone (research): [phone]  6:38 AM 07/06/2020   6:38 AM 07/06/2020

## 2020-07-06 NOTE — Patient Instructions (Signed)
Research IPF  Per protocol

## 2020-07-06 NOTE — Assessment & Plan Note (Signed)
Chronic  Clinically euthyroid Currently taking levothyroxine 100 mcg daily Check tsh  Titrate med dose if needed

## 2020-07-07 LAB — HEPATITIS C ANTIBODY
Hepatitis C Ab: NONREACTIVE
SIGNAL TO CUT-OFF: 0.01 (ref <1.00)

## 2020-07-07 LAB — IRON,TIBC AND FERRITIN PANEL
%SAT: 22 % (calc) (ref 16–45)
Ferritin: 58 ng/mL (ref 16–288)
Iron: 70 ug/dL (ref 45–160)
TIBC: 319 mcg/dL (calc) (ref 250–450)

## 2020-07-08 ENCOUNTER — Encounter: Payer: Self-pay | Admitting: Internal Medicine

## 2020-07-27 ENCOUNTER — Other Ambulatory Visit: Payer: Self-pay

## 2020-07-27 ENCOUNTER — Encounter: Payer: Medicare Other | Admitting: *Deleted

## 2020-07-27 ENCOUNTER — Encounter (INDEPENDENT_AMBULATORY_CARE_PROVIDER_SITE_OTHER): Payer: Medicare Other | Admitting: Adult Health

## 2020-07-27 DIAGNOSIS — J84112 Idiopathic pulmonary fibrosis: Secondary | ICD-10-CM

## 2020-07-27 DIAGNOSIS — Z006 Encounter for examination for normal comparison and control in clinical research program: Secondary | ICD-10-CM

## 2020-07-27 DIAGNOSIS — J849 Interstitial pulmonary disease, unspecified: Secondary | ICD-10-CM

## 2020-07-27 NOTE — Progress Notes (Signed)
_0  ID: Elizabeth Espinoza, female    DOB: 04/13/1940, 80 y.o.   MRN: [id number]    Referring provider: Binnie Rail, MD   Title: A randomized, double-blind, multicenter, dose-ranging, placebo-controlled Phase 2a evaluation of the safety, tolerability and pharmacokinetics of [phone] in participants with idiopathic pulmonary fibrosis (IPF)   Patients will be randomized in a 3:1 ratio to one of two treatment arms:  320 mg [id number] or placebo.   Protocol #: [id number], Clinical Trials EUDRACT# [id number]  ** Sponsor [employer] [email or web address]   ([place] [address])   Key Inclusion Criteria:   Age >/= 79 years old Confident diagnosis of IPF  up to 5 years. If IPF diagnosis is within > 3 to = 5 years at screening, the participant must have evidence of progression within the last 24 months, as defined by decline in FVC percent predicted based on a relative decline of = 5% FVC% pred >/=45%  DLCO (hgb-adjusted) >/=30% Treatment with nintedanib or pirfenidone allowed, But must have stable dose =3 months before Screening Visit and remain unchanged during the study   Key Exclusion Criteria:   Receiving any non-approved agent intended for treatment of fibrosis in IPF FEV1 over the FVC ratio < 0.7 at screening Active infection,  that can affect FVC measurement during Screening or  Randomization Extent of emphysema > fibrotic changes on most recent HRCT IPF exacerbation within 6 months of Screening  Severe pulmonary hypertension Smoking of any kind w/in 3 months of Screening or unwilling to avoid smoking throughout the study Renal impairment or end-stage kidney disease requiring dialysis History of unstable /deteriorating cardiac/pulmonary disease (other than IPF) w/in 6 months of Screening Any ongoing known malignancy, except for localized cancers (i.e. basal cell carcinoma) Medical or surgical condition known to affect drug absorption Surgeries  scheduled during the study period    Side-Effects: [id number] was generally well tolerated in over 280 healthy participants with no clinical safety concerns or dose-limiting toxicities observed for single doses up to 665m and multiple doses up to 3289mQD administered for up to 14 days. The major side-effects reported have been headache (mild) and constipation (mild) in 3-76% of subjects. Other side effects, in 1-2% of subjects, were mild to moderate, with fever being the only severe side-effect reported.  Mechanism: Selective dual aV6 and aV1 integrin inhibitor with antifibrotic activity. [id number] aV6 and aV1 from binding to the RGD sequence of latency-associated peptide (LAP), this blocks the release of activated TGF-1, thereby blocking the activation of pro-fibrotic pathways. Additionally, PLMPN-36144as been shown to reduce collagen synthesis and gene expression in human IPF tissue.   Interactions: Acts as a substrate of P-gp, OATP1B1, OATP1B3, OAT3 Inhibitors or inducers of these enzymes may affect the intestinal absorption, hepatic uptake or renal uptake of [id number] by CYP3A4, CYP2C9, CYP2C19 Potent inhibitors or inducers of these enzymes should be avoided Each CYP enzyme metabolizes < 10% of the parent compound Since multiple CYP enzymes are involved, clinically relevant drug-drug interactions are less likely, as there are other alternative pathways A potent inhibitor of multiple pathways should be used with caution as that has the potential to elicit a clinically relevant drug-drug interaction i.e. medication that inhibits CYP3A4, CYP2C9, CYP2C19 [id number]-95093s not an inducer of CYP1A2, CYP2B6, or CYP3A4 Showed moderate inhibition of P-gp and MATE1, substrates of P-gp and MATE1 may  have increased exposure when coadministered with [id number] by extrahepatic enzymes: CYP1A1, UGT1A7 Clinical relevance relating to drug interactions is unknown Stable  when incubated with human recombinant MAO-A and MAO-B [id number] is not an inhibitor or inducer of CYP isoforms and may be co-administered with substrates of CYP isoforms without risk of DDI.  Interactions with specific drugs: Potential for increased exposure of nintedanib Recommend separating time of administration of [id number] and nintedanib Drug interactions with pirfenidone are not expected Use of corticosteroids should be discussed beforehand with the medical monitor   Clinical drug interactions studies have not been performed at this time and therefore guidance is based on the in-vitro studies  Disallowed Medications/Foods/Supplements: Fluconazole Digoxin Rifampin Grapefruit Grapefruit-containing foods and beverages St. John's Wort   Pharmacokinetics: Tmax : 2-3 hours Half-life: approximately 48 hours Steady state reached in 5-7 days Minimal renal clearance (8.5% of drug is cleared by the kidneys)    Administration [phone] should be administered on an empty stomach May be administered via feeding tube   Safety:   Edition Addendum to Edition 5.0 - 12 February 2020   Animal studies: No effect on neurobehavior No effect on respiratory parameters No effects on blood pressure Showed an increase in QT and QTc The high dose 400 mg/kg showed an increase in mean heart rate   Cardiovascular effects in mice after administration of [id number]: Dose-dependent increases in blood pressure Increase in PR and QRS duration Increase in arrhythmias  Reproductive animal toxicology studies at the low and medium doses, 150 and 300 mg/kg/day, revealed no impact on: maternal survival, fetal body weight, food parameters, uterine and ovarian parameters, no external malformations  When the high dose 600 mg/kg/day was tested in rabbits, the rabbits displayed increased morbidities and increased abortions   Phase 1 studies:   Type of TEAE [id number] n=32 Placebo n=14 GI disorders 7  (22%) 0 Infections 3 (9%) 0 Musculoskeletal 1 (3%) 0 Nervous system disorders 2 (6%) 0   Most common treatment-emergent adverse event (TEAE):  headache  The most common drug-related adverse events were: nausea, diarrhea, abdominal distension. These occurred in 12.5% of participants  There were no deaths or serious adverse events in any of the Phase I studies There were no clinically meaningful changes in vital signs, telemetry or laboratory parameters [phone] did not display any clinically significant effect on ECG (iIncluding Qtc, HR, RR, PR and QRS)  Study [id number] All TEAEs were mild and deemed unrelated to study drug One TEAE, viral infection, led to discontinuation of IP No deaths nor SAEs  As of the 23 October 2019 cutoff date, preliminary data from ongoing and discontinued Phase 2a studies suggest no new safety concerns.  All SAE's reported to date were considered unrelated to study drug by the investigator. All treatment-emergent SAE's were known manifestations of the underlying  diseases (IPF, PSC or ARDS) and considered by the investigator to be most likely due to disease progresssion.  Part D of the study will evaluate approximately 28 participants at the 320 mg dose for at least 24 weeks to further characterize the safety profile of [id number]. Ongoing safety and FVC assessments will continue until the last participant reaches 24 weeks of the study; therefore, safety and FVC assessments may extend to 48 weeks in participants randomized earlier in the study.   07/27/2020 Research Visit #5  This visit for Subject SHAM ALVIAR with DOB: October 14, 1940 on 07/28/2020 for the above protocol is Visit/Encounter # 5  and is for purpose of research  . Subject/LAR expressed continued interest and consent in continuing as a study subject. Subject thanked for participation in research and contribution to science.  Did notice a pruritic rash along elbows , knees and buttock  since last study visit. History of dermatitis and urticaria in past , followed by Dermatology . AE was reported per protocol . Advised patient to follow up with Dermatology /PCP. She says is getting better with prescribed topical cream she uses for urticaria .   Allergies  Allergen Reactions   Caffeine     palpitations   Chlorpheniramine-Pseudoeph     palpitations   Maxifed     palpitations   Other Other (See Comments)   Pirfenidone Nausea Only    Esbriet Causes nausea and weakness    Immunization History  Administered Date(s) Administered   Influenza Split 11/07/2010   Influenza Whole 10/30/2009, 11/27/2011   Influenza, High Dose Seasonal PF 10/27/2012, 11/05/2013, 10/01/2017, 09/11/2018, 11/23/2019   Influenza-Unspecified 11/24/2014, 10/07/2015, 10/08/2016   PFIZER(Purple Top)SARS-COV-2 Vaccination 03/14/2019, 04/06/2019, 10/27/2019   Pneumococcal Conjugate-13 10/07/2015   Pneumococcal Polysaccharide-23 07/02/2017   Tdap 02/11/2012   Zoster Recombinat (Shingrix) 10/01/2017, 12/03/2017   Zoster, Live 01/09/2012    Past Medical History:  Diagnosis Date   Allergic rhinitis    GERD (gastroesophageal reflux disease)    Glaucoma    SUSPECT   Hiatal hernia    Hyperlipidemia    LDL goal = < 100   Hypothyroidism    ILD (interstitial lung disease) ([place])    Lichen planopilaris    MVP (mitral valve prolapse)    documented on 2 D ECHO   Restless legs    Urticaria     Tobacco History: Social History   Tobacco Use  Smoking Status Never  Smokeless Tobacco Never   Counseling given: Not Answered   Outpatient Medications Prior to Visit  Medication Sig Dispense Refill   aspirin EC 81 MG tablet Take 1 tablet (81 mg total) by mouth daily. 90 tablet 3   cetirizine (ZYRTEC) 10 MG tablet Take 10 mg by mouth daily.      cholecalciferol (VITAMIN D3) 25 MCG (1000 UT) tablet Take 1,000 Units by mouth daily.     clobetasol (TEMOVATE) 0.05 % external solution SMARTSIG:1  Milliliter(s) Topical Twice Daily     esomeprazole (NEXIUM) 40 MG capsule Take 40 mg by mouth every morning.     famotidine (PEPCID) 40 MG tablet Take 1 tablet (40 mg total) by mouth daily. 90 tablet 1   fluticasone (FLONASE) 50 MCG/ACT nasal spray Place 1 spray into both nostrils daily as needed for allergies or rhinitis.     hydroxychloroquine (PLAQUENIL) 200 MG tablet Take 200 mg by mouth 2 (two) times daily.     levothyroxine (SYNTHROID) 100 MCG tablet Take 1 tablet (100 mcg total) by mouth daily. 90 tablet 3   magnesium oxide (MAG-OX) 400 MG tablet Take 400 mg by mouth daily.     Multiple Vitamins-Minerals (ZINC PO) Take 1 tablet by mouth.     pravastatin (PRAVACHOL) 40 MG tablet TAKE 1 TABLET BY MOUTH  DAILY 90 tablet 3   Probiotic Product (PROBIOTIC ADVANCED) CAPS Take by mouth.     rOPINIRole (REQUIP) 0.5 MG tablet TAKE 2 TABLETS BY MOUTH  AFTER DINNER EACH NIGHT. 180 tablet 3   vitamin B-12 (CYANOCOBALAMIN) 500 MCG tablet Take 500 mcg by mouth daily.     No facility-administered medications prior to visit.         Physical Exam  See Research exam      BNP No results found for: BNP  ProBNP No results found for: PROBNP  Imaging: No results  found.    PFT Results Latest Ref Rng & Units 03/04/2020 09/15/2019 03/16/2019 01/29/2018  FVC-Pre L 3.07 2.92 2.91 2.97  FVC-Predicted Pre % 104 99 97 98  FVC-Post L - - - 2.89  FVC-Predicted Post % - - - 95  Pre FEV1/FVC % % 78 80 79 81  Post FEV1/FCV % % - - - 83  FEV1-Pre L 2.39 2.34 2.29 2.40  FEV1-Predicted Pre % 108 106 102 105  FEV1-Post L - - - 2.39  DLCO uncorrected ml/min/mmHg 12.94 12.84 12.32 14.80  DLCO UNC% % 63 63 60 54  DLCO corrected ml/min/mmHg 12.94 13.26 12.91 -  DLCO COR %Predicted % 63 65 63 -  DLVA Predicted % 70 71 73 68    No results found for: NITRICOXIDE      Assessment & Plan:   No problem-specific Assessment & Plan notes found for this encounter.     Rexene Edison, NP 07/27/2020

## 2020-07-27 NOTE — Research (Signed)
Title: A randomized, double-blind, multicenter, dose-ranging, placebo-controlled Phase 2a evaluation of the safety, tolerability and pharmacokinetics of [id number] in participants with idiopathic pulmonary fibrosis (IPF)   [facility] [place] Coordinator note :   This visit for Subject Elizabeth Espinoza with DOB: [birth year] on 07/27/2020 for the above protocol is Visit #5 and is for purpose of research.   The consent for this encounter is under Protocol Version Amendment 3.0 , Investigator Brochure Edition V. 5.0 Addendum, Consent Version 5.1 and is currently IRB approved.    Subject expressed continued interest and consent in continuing as a study subject. Subject confirmed that there was no change in contact information (e.g. address, telephone, email). Subject thanked for participation in research and contribution to science.  In this visit 07/27/2020 the subject will be evaluated by Sub-Investigator named Rexene Edison, NP . This research coordinator has verified that the above investigator is up to date with her training logs.   The Subject was informed that the PI Dr. Chase Caller continues to have oversight of the subject's visits and course through relevant discussions, reviews and also specifically of this visit by routing of this note to the [place].  All study visit 5 procedures and assessments were completed per the above mentioned protocol. The subject reported that a new rash has appeared on her elbows, knees and buttocks. The rash is itchy and started 2 weeks ago (22Jun2022). She was encouraged to follow up with her dermatologist about this rash. It will be reported as an AE to the study sponsor. No other new AE's were reported. The subject is 96.7% compliant with taking study medication, but she thought that she had taken it every day. Based on the number of tablets returned, she missed one day dosing. Please refer to the subject's study binder for further details of the visit. She  will return in 4 weeks for study visit 6.  Signed by  Hale Drone, MS, [place] Coordinator  [place], [place] 12:58 PM 07/27/2020

## 2020-07-28 NOTE — Assessment & Plan Note (Signed)
Continue study protocol 

## 2020-07-28 NOTE — Assessment & Plan Note (Signed)
Continue on current regimen

## 2020-07-29 ENCOUNTER — Other Ambulatory Visit: Payer: Self-pay | Admitting: Internal Medicine

## 2020-08-01 ENCOUNTER — Other Ambulatory Visit: Payer: Self-pay | Admitting: Internal Medicine

## 2020-08-01 DIAGNOSIS — Z1231 Encounter for screening mammogram for malignant neoplasm of breast: Secondary | ICD-10-CM

## 2020-08-04 DIAGNOSIS — L661 Lichen planopilaris: Secondary | ICD-10-CM | POA: Diagnosis not present

## 2020-08-04 DIAGNOSIS — L308 Other specified dermatitis: Secondary | ICD-10-CM | POA: Diagnosis not present

## 2020-08-17 ENCOUNTER — Other Ambulatory Visit: Payer: Self-pay | Admitting: Internal Medicine

## 2020-08-17 DIAGNOSIS — Z006 Encounter for examination for normal comparison and control in clinical research program: Secondary | ICD-10-CM

## 2020-08-17 DIAGNOSIS — J84112 Idiopathic pulmonary fibrosis: Secondary | ICD-10-CM

## 2020-08-23 ENCOUNTER — Encounter (INDEPENDENT_AMBULATORY_CARE_PROVIDER_SITE_OTHER): Payer: Medicare Other | Admitting: Adult Health

## 2020-08-23 ENCOUNTER — Encounter: Payer: Self-pay | Admitting: Adult Health

## 2020-08-23 ENCOUNTER — Encounter: Payer: Medicare Other | Admitting: *Deleted

## 2020-08-23 ENCOUNTER — Other Ambulatory Visit: Payer: Self-pay

## 2020-08-23 DIAGNOSIS — Z006 Encounter for examination for normal comparison and control in clinical research program: Secondary | ICD-10-CM

## 2020-08-23 DIAGNOSIS — T7840XA Allergy, unspecified, initial encounter: Secondary | ICD-10-CM | POA: Diagnosis not present

## 2020-08-23 DIAGNOSIS — J84112 Idiopathic pulmonary fibrosis: Secondary | ICD-10-CM

## 2020-08-23 DIAGNOSIS — H9193 Unspecified hearing loss, bilateral: Secondary | ICD-10-CM | POA: Diagnosis not present

## 2020-08-23 DIAGNOSIS — J849 Interstitial pulmonary disease, unspecified: Secondary | ICD-10-CM

## 2020-08-23 NOTE — Assessment & Plan Note (Signed)
Appears stable continue current regimen

## 2020-08-23 NOTE — Research (Signed)
Title: A randomized, double-blind, multicenter, dose-ranging, placebo-controlled Phase 2a evaluation of the safety, tolerability and pharmacokinetics of [id number] in participants with idiopathic pulmonary fibrosis (IPF)   [facility] Coordinator note :   This visit for Subject Elizabeth Espinoza with DOB: 1940/07/21 on 08/23/2020 for the above protocol is Visit #6 and is for purpose of research.   The consent for this encounter is under Protocol Version Amendment 3 , Investigator Brochure Edition V.5.0 Addendum, Consent Version 5.1 and is currently IRB approved.   Subject expressed continued interest and consent in continuing as a study subject. Subject confirmed that there was no change in contact information (e.g. address, telephone, email). Subject thanked for participation in research and contribution to science.  In this visit 08/23/2020 the subject will be evaluated by Rexene Edison, NP, Sub-Investigator. This research coordinator has verified that the above sub-investigator is up to date with her training logs.   The Subject was informed that the PI Dr. Chase Caller continues to have oversight of the subject's visits and course  through relevant discussions, reviews and also specifically of this visit by routing of this note to the [place].   All study visit 6 procedures and assessments were completed per the above mentioned protocol. No new AE's were reported. The subject reports that the AE of worsening of urticaria is still ongoing but seems to be improving somewhat. The subject is 100% compliant with taking study medication and with completing the diary. Please refer to the subject's study binder for further details of the visit. She is scheduled for the visit 6 HRCT on 08Aug2022. She will return in 2 weeks for study visit 7.  Signed by  Hale Drone, MS, [place] Coordinator  [facility]  [place], [place] 1:32 PM 08/23/2020

## 2020-08-23 NOTE — Progress Notes (Signed)
_0  ID: Elizabeth Espinoza, female    DOB: 27-Jun-1940, 80 y.o.   MRN: [id number]  Chief Complaint  Patient presents with   Follow-up    Referring provider: Binnie Rail, MD  HPI:   Title: A randomized, double-blind, multicenter, dose-ranging, placebo-controlled Phase 2a evaluation of the safety, tolerability and pharmacokinetics of [id number] in participants with idiopathic pulmonary fibrosis (IPF)   Patients will be randomized in a 3:1 ratio to one of two treatment arms:  320 mg [id number] or placebo.   Protocol #: [id number], Clinical Trials EUDRACT# [id number]  ** Sponsor [employer] [email or web address]   ([place] [address])   Protocol Version Amendment 3 (ver.3.0) for 08/23/2020 Date of 19OCT2021   Consent Version 5.1 dated 11Jan2022, approved  for 08/23/2020 date of   Investigator Brochure Version 5.0  Addendum for 08/23/2020 date of 21JAN2022   Key Inclusion Criteria:   Age >/= 79 years old Confident diagnosis of IPF  up to 5 years. If IPF diagnosis is within > 3 to = 5 years at screening, the participant must have evidence of progression within the last 24 months, as defined by decline in FVC percent predicted based on a relative decline of = 5% FVC% pred >/=45%  DLCO (hgb-adjusted) >/=30% Treatment with nintedanib or pirfenidone allowed, But must have stable dose =3 months before Screening Visit and remain unchanged during the study   Key Exclusion Criteria:   Receiving any non-approved agent intended for treatment of fibrosis in IPF FEV1 over the FVC ratio < 0.7 at screening Active infection,  that can affect FVC measurement during Screening or  Randomization Extent of emphysema > fibrotic changes on most recent HRCT IPF exacerbation within 6 months of Screening  Severe pulmonary hypertension Smoking of any kind w/in 3 months of Screening or unwilling to avoid smoking throughout the study Renal impairment or end-stage kidney disease  requiring dialysis History of unstable /deteriorating cardiac/pulmonary disease (other than IPF) w/in 6 months of Screening Any ongoing known malignancy, except for localized cancers (i.e. basal cell carcinoma) Medical or surgical condition known to affect drug absorption Surgeries scheduled during the study period    Side-Effects: [id number] was generally well tolerated in over 280 healthy participants with no clinical safety concerns or dose-limiting toxicities observed for single doses up to 64m and multiple doses up to 3220mQD administered for up to 14 days. The major side-effects reported have been headache (mild) and constipation (mild) in 3-76% of subjects. Other side effects, in 1-2% of subjects, were mild to moderate, with fever being the only severe side-effect reported.  Mechanism: Selective dual aV6 and aV1 integrin inhibitor with antifibrotic activity. [id number]-16384revents aV6 and aV1 from binding to the RGD sequence of latency-associated peptide (LAP), this blocks the release of activated TGF-1, thereby blocking the activation of pro-fibrotic pathways. Additionally, [id number]-46803as been shown to reduce collagen synthesis and gene expression in human IPF tissue.   Interactions: Acts as a substrate of P-gp, OATP1B1, OATP1B3, OAT3 Inhibitors or inducers of these enzymes may affect the intestinal absorption, hepatic uptake or renal uptake of [id number] by CYP3A4, CYP2C9, CYP2C19 Potent inhibitors or inducers of these enzymes should be avoided Each CYP enzyme metabolizes < 10% of the parent compound Since multiple CYP enzymes are involved, clinically relevant drug-drug interactions are less likely, as there are other alternative pathways A potent inhibitor of multiple pathways should be used with caution as that has the potential to elicit a clinically relevant drug-drug interaction i.e. medication that  inhibits CYP3A4, CYP2C9, CYP2C19 [id number] is not an inducer of  CYP1A2, CYP2B6, or CYP3A4 Showed moderate inhibition of P-gp and MATE1, substrates of P-gp and MATE1 may  have increased exposure when coadministered with [id number] Metabolized by extrahepatic enzymes: CYP1A1, UGT1A7 Clinical relevance relating to drug interactions is unknown Stable when incubated with human recombinant MAO-A and MAO-B [id number] is not an inhibitor or inducer of CYP isoforms and may be co-administered with substrates of CYP isoforms without risk of DDI.  Interactions with specific drugs: Potential for increased exposure of nintedanib Recommend separating time of administration of [id number]-12751 and nintedanib Drug interactions with pirfenidone are not expected Use of corticosteroids should be discussed beforehand with the medical monitor   Clinical drug interactions studies have not been performed at this time and therefore guidance is based on the in-vitro studies  Disallowed Medications/Foods/Supplements: Fluconazole Digoxin Rifampin Grapefruit Grapefruit-containing foods and beverages St. John's Wort   Pharmacokinetics: Tmax : 2-3 hours Half-life: approximately 48 hours Steady state reached in 5-7 days Minimal renal clearance (8.5% of drug is cleared by the kidneys)    Administration [phone] should be administered on an empty stomach May be administered via feeding tube   Safety:   Edition Addendum to Edition 5.0 - 12 February 2020   Animal studies: No effect on neurobehavior No effect on respiratory parameters No effects on blood pressure Showed an increase in QT and QTc The high dose 400 mg/kg showed an increase in mean heart rate   Cardiovascular effects in mice after administration of [id number]: Dose-dependent increases in blood pressure Increase in PR and QRS duration Increase in arrhythmias  Reproductive animal toxicology studies at the low and medium doses, 150 and 300 mg/kg/day, revealed no impact on: maternal survival, fetal body weight, food  parameters, uterine and ovarian parameters, no external malformations  When the high dose 600 mg/kg/day was tested in rabbits, the rabbits displayed increased morbidities and increased abortions   Phase 1 studies:   Type of TEAE [id number] n=32 Placebo n=14 GI disorders 7 (22%) 0 Infections 3 (9%) 0 Musculoskeletal 1 (3%) 0 Nervous system disorders 2 (6%) 0   Most common treatment-emergent adverse event (TEAE):  headache  The most common drug-related adverse events were: nausea, diarrhea, abdominal distension. These occurred in 12.5% of participants  There were no deaths or serious adverse events in any of the Phase I studies There were no clinically meaningful changes in vital signs, telemetry or laboratory parameters [phone] did not display any clinically significant effect on ECG (iIncluding Qtc, HR, RR, PR and QRS)  Study [id number] All TEAEs were mild and deemed unrelated to study drug One TEAE, viral infection, led to discontinuation of IP No deaths nor SAEs  As of the 23 October 2019 cutoff date, preliminary data from ongoing and discontinued Phase 2a studies suggest no new safety concerns.  All SAE's reported to date were considered unrelated to study drug by the investigator. All treatment-emergent SAE's were known manifestations of the underlying  diseases (IPF, PSC or ARDS) and considered by the investigator to be most likely due to disease progresssion.  Part D of the study will evaluate approximately 28 participants at the 320 mg dose for at least 24 weeks to further characterize the safety profile of [id number]. Ongoing safety and FVC assessments will continue until the last participant reaches 24 weeks of the study; therefore, safety and FVC assessments may extend to 48 weeks in participants randomized earlier in the study.   08/23/2020 Research Visit #6  This  visit for Subject LACEE GREY with DOB: [id number] on 08/23/2020 for the above protocol is  Visit/Encounter # 6  and is for purpose of research  . Subject/LAR expressed continued interest and consent in continuing as a study subject. Subject thanked for participation in research and contribution to science. 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 Has seen dermatology, some improvement since last visit. Ongoing AE reported per protocol.  Cedric says she is doing well.  Continues to exercise most every day.  Is up to 4 miles on her stationary bike 5 to 7 days a week.  She has been able to increase her activity tolerance from 3 miles to 4 miles now.  She also says that she can go up stairs more easily with less dyspnea. She also is lifting light weights at home.       Allergies  Allergen Reactions   Caffeine     palpitations   Chlorpheniramine-Pseudoeph     palpitations   Maxifed     palpitations   Other Other (See Comments)   Pirfenidone Nausea Only    Esbriet Causes nausea and weakness    Immunization History  Administered Date(s) Administered   Influenza Split 11/07/2010   Influenza Whole 10/30/2009, 11/27/2011   Influenza, High Dose Seasonal PF 10/27/2012, 11/05/2013, 10/01/2017, 09/11/2018, 11/23/2019   Influenza-Unspecified 11/24/2014, 10/07/2015, 10/08/2016   PFIZER(Purple Top)SARS-COV-2 Vaccination 03/14/2019, 04/06/2019, 10/27/2019   Pneumococcal Conjugate-13 10/07/2015   Pneumococcal Polysaccharide-23 07/02/2017   Tdap 02/11/2012   Zoster Recombinat (Shingrix) 10/01/2017, 12/03/2017   Zoster, Live 01/09/2012    Past Medical History:  Diagnosis Date   Allergic rhinitis    GERD (gastroesophageal reflux disease)    Glaucoma    SUSPECT   Hiatal hernia    Hyperlipidemia    LDL goal = < 100   Hypothyroidism    ILD (interstitial lung disease) ([place])    Lichen planopilaris    MVP (mitral valve prolapse)    documented on 2 D ECHO   Restless legs    Urticaria     Tobacco History: Social History   Tobacco Use  Smoking Status  Never  Smokeless Tobacco Never   Counseling given: Not Answered   Outpatient Medications Prior to Visit  Medication Sig Dispense Refill   aspirin EC 81 MG tablet Take 1 tablet (81 mg total) by mouth daily. 90 tablet 3   cetirizine (ZYRTEC) 10 MG tablet Take 10 mg by mouth daily.      cholecalciferol (VITAMIN D3) 25 MCG (1000 UT) tablet Take 1,000 Units by mouth daily.     clobetasol (TEMOVATE) 0.05 % external solution SMARTSIG:1 Milliliter(s) Topical Twice Daily     esomeprazole (NEXIUM) 40 MG capsule Take 40 mg by mouth every morning.     famotidine (PEPCID) 40 MG tablet Take 1 tablet (40 mg total) by mouth daily. 90 tablet 1   fluticasone (FLONASE) 50 MCG/ACT nasal spray Place 1 spray into both nostrils daily as needed for allergies or rhinitis.     hydroxychloroquine (PLAQUENIL) 200 MG tablet Take 200 mg by mouth 2 (two) times daily.     magnesium oxide (MAG-OX) 400 MG tablet Take 400 mg by mouth daily.     Multiple Vitamins-Minerals (ZINC PO) Take 1 tablet by mouth.     pravastatin (PRAVACHOL) 40 MG tablet TAKE 1 TABLET BY MOUTH  DAILY 90 tablet 3   Probiotic Product (PROBIOTIC ADVANCED) CAPS Take by mouth.     rOPINIRole (REQUIP) 0.5  MG tablet TAKE 2 TABLETS BY MOUTH  AFTER DINNER EACH NIGHT. 180 tablet 3   SYNTHROID 100 MCG tablet TAKE 1 TABLET BY MOUTH  DAILY 90 tablet 3   vitamin B-12 (CYANOCOBALAMIN) 500 MCG tablet Take 500 mcg by mouth daily.     No facility-administered medications prior to visit.   EXAM  See research physical exam  Lab Results:    BNP No results found for: BNP  ProBNP No results found for: PROBNP  Imaging: No results found.    PFT Results Latest Ref Rng & Units 03/04/2020 09/15/2019 03/16/2019 01/29/2018  FVC-Pre L 3.07 2.92 2.91 2.97  FVC-Predicted Pre % 104 99 97 98  FVC-Post L - - - 2.89  FVC-Predicted Post % - - - 95  Pre FEV1/FVC % % 78 80 79 81  Post FEV1/FCV % % - - - 83  FEV1-Pre L 2.39 2.34 2.29 2.40  FEV1-Predicted Pre % 108 106  102 105  FEV1-Post L - - - 2.39  DLCO uncorrected ml/min/mmHg 12.94 12.84 12.32 14.80  DLCO UNC% % 63 63 60 54  DLCO corrected ml/min/mmHg 12.94 13.26 12.91 -  DLCO COR %Predicted % 63 65 63 -  DLVA Predicted % 70 71 73 68    No results found for: NITRICOXIDE      Assessment & Plan:   Research study patient Continue study protocol  ILD (interstitial lung disease) ([place]) Appears stable continue current regimen     Rexene Edison, NP 08/23/2020

## 2020-08-23 NOTE — Assessment & Plan Note (Signed)
Continue study protocol 

## 2020-08-29 ENCOUNTER — Other Ambulatory Visit: Payer: Self-pay

## 2020-08-29 ENCOUNTER — Ambulatory Visit (INDEPENDENT_AMBULATORY_CARE_PROVIDER_SITE_OTHER)
Admission: RE | Admit: 2020-08-29 | Discharge: 2020-08-29 | Disposition: A | Discharge status: Home / Self Care | Payer: Self-pay | Source: Ambulatory Visit | Attending: Internal Medicine | Admitting: Internal Medicine

## 2020-08-29 DIAGNOSIS — J479 Bronchiectasis, uncomplicated: Secondary | ICD-10-CM | POA: Diagnosis not present

## 2020-08-29 DIAGNOSIS — Z006 Encounter for examination for normal comparison and control in clinical research program: Secondary | ICD-10-CM

## 2020-08-29 DIAGNOSIS — J84112 Idiopathic pulmonary fibrosis: Secondary | ICD-10-CM

## 2020-08-29 DIAGNOSIS — I700 Atherosclerosis of aorta: Secondary | ICD-10-CM | POA: Diagnosis not present

## 2020-09-05 DIAGNOSIS — H903 Sensorineural hearing loss, bilateral: Secondary | ICD-10-CM | POA: Diagnosis not present

## 2020-09-06 ENCOUNTER — Other Ambulatory Visit: Payer: Self-pay

## 2020-09-06 ENCOUNTER — Encounter: Payer: Self-pay | Admitting: Adult Health

## 2020-09-06 ENCOUNTER — Encounter: Payer: Medicare Other | Admitting: *Deleted

## 2020-09-06 ENCOUNTER — Encounter (INDEPENDENT_AMBULATORY_CARE_PROVIDER_SITE_OTHER): Payer: Medicare Other | Admitting: Adult Health

## 2020-09-06 DIAGNOSIS — J84112 Idiopathic pulmonary fibrosis: Secondary | ICD-10-CM

## 2020-09-06 DIAGNOSIS — Z006 Encounter for examination for normal comparison and control in clinical research program: Secondary | ICD-10-CM

## 2020-09-06 DIAGNOSIS — J849 Interstitial pulmonary disease, unspecified: Secondary | ICD-10-CM

## 2020-09-06 NOTE — Assessment & Plan Note (Signed)
Appears stable , continue on current regimen

## 2020-09-06 NOTE — Progress Notes (Signed)
_0  ID: Elizabeth Espinoza, female    DOB: [birth year], 80 y.o.   MRN: [id number]  Chief Complaint  Patient presents with   Follow-up    Referring provider: Binnie Rail, MD  HPI: Title: A randomized, double-blind, multicenter, dose-ranging, placebo-controlled Phase 2a evaluation of the safety, tolerability and pharmacokinetics of [id number] in participants with idiopathic pulmonary fibrosis (IPF)   Patients will be randomized in a 3:1 ratio to one of two treatment arms:  320 mg [id number] or placebo.   Protocol #: [id number], Clinical Trials EUDRACT# [id number]  ** Sponsor [employer] [email or web address]   ([place] [address])   Protocol Version Amendment 3 (ver.3.0) for 09/06/2020  Date of 19OCT2021   Consent Version 5.1 dated 11Jan2022, approved  for 09/06/2020  Investigator Brochure Version 5.0  Addendum for 09/06/2020 date of 21JAN2022   Key Inclusion Criteria:   Age >/= 79 years old Confident diagnosis of IPF  up to 5 years. If IPF diagnosis is within > 3 to = 5 years at screening, the participant must have evidence of progression within the last 24 months, as defined by decline in FVC percent predicted based on a relative decline of = 5% FVC% pred >/=45%  DLCO (hgb-adjusted) >/=30% Treatment with nintedanib or pirfenidone allowed, But must have stable dose =3 months before Screening Visit and remain unchanged during the study   Key Exclusion Criteria:   Receiving any non-approved agent intended for treatment of fibrosis in IPF FEV1 over the FVC ratio < 0.7 at screening Active infection,  that can affect FVC measurement during Screening or  Randomization Extent of emphysema > fibrotic changes on most recent HRCT IPF exacerbation within 6 months of Screening  Severe pulmonary hypertension Smoking of any kind w/in 3 months of Screening or unwilling to avoid smoking throughout the study Renal impairment or end-stage kidney disease requiring  dialysis History of unstable /deteriorating cardiac/pulmonary disease (other than IPF) w/in 6 months of Screening Any ongoing known malignancy, except for localized cancers (i.e. basal cell carcinoma) Medical or surgical condition known to affect drug absorption Surgeries scheduled during the study period    Side-Effects: [id number] was generally well tolerated in over 280 healthy participants with no clinical safety concerns or dose-limiting toxicities observed for single doses up to 6100m and multiple doses up to 3250mQD administered for up to 14 days. The major side-effects reported have been headache (mild) and constipation (mild) in 3-76% of subjects. Other side effects, in 1-2% of subjects, were mild to moderate, with fever being the only severe side-effect reported.  Mechanism: Selective dual aV6 and aV1 integrin inhibitor with antifibrotic activity. [id number] aV6 and aV1 from binding to the RGD sequence of latency-associated peptide (LAP), this blocks the release of activated TGF-1, thereby blocking the activation of pro-fibrotic pathways. Additionally, [id number]-63817as been shown to reduce collagen synthesis and gene expression in human IPF tissue.   Interactions: Acts as a substrate of P-gp, OATP1B1, OATP1B3, OAT3 Inhibitors or inducers of these enzymes may affect the intestinal absorption, hepatic uptake or renal uptake of [id number] by CYP3A4, CYP2C9, CYP2C19 Potent inhibitors or inducers of these enzymes should be avoided Each CYP enzyme metabolizes < 10% of the parent compound Since multiple CYP enzymes are involved, clinically relevant drug-drug interactions are less likely, as there are other alternative pathways A potent inhibitor of multiple pathways should be used with caution as that has the potential to elicit a clinically relevant drug-drug interaction i.e. medication that inhibits CYP3A4, CYP2C9, CYP2C19  [id number] is not an inducer of CYP1A2,  CYP2B6, or CYP3A4 Showed moderate inhibition of P-gp and MATE1, substrates of P-gp and MATE1 may  have increased exposure when coadministered with [id number] Metabolized by extrahepatic enzymes: CYP1A1, UGT1A7 Clinical relevance relating to drug interactions is unknown Stable when incubated with human recombinant MAO-A and MAO-B [id number] is not an inhibitor or inducer of CYP isoforms and may be co-administered with substrates of CYP isoforms without risk of DDI.  Interactions with specific drugs: Potential for increased exposure of nintedanib Recommend separating time of administration of FBX-03833 and nintedanib Drug interactions with pirfenidone are not expected Use of corticosteroids should be discussed beforehand with the medical monitor   Clinical drug interactions studies have not been performed at this time and therefore guidance is based on the in-vitro studies  Disallowed Medications/Foods/Supplements: Fluconazole Digoxin Rifampin Grapefruit Grapefruit-containing foods and beverages St. John's Wort   Pharmacokinetics: Tmax : 2-3 hours Half-life: approximately 48 hours Steady state reached in 5-7 days Minimal renal clearance (8.5% of drug is cleared by the kidneys)    Administration [phone] should be administered on an empty stomach May be administered via feeding tube   Safety:   Edition Addendum to Edition 5.0 - 12 February 2020   Animal studies: No effect on neurobehavior No effect on respiratory parameters No effects on blood pressure Showed an increase in QT and QTc The high dose 400 mg/kg showed an increase in mean heart rate   Cardiovascular effects in mice after administration of YOM-60045: Dose-dependent increases in blood pressure Increase in PR and QRS duration Increase in arrhythmias  Reproductive animal toxicology studies at the low and medium doses, 150 and 300 mg/kg/day, revealed no impact on: maternal survival, fetal body weight, food  parameters, uterine and ovarian parameters, no external malformations  When the high dose 600 mg/kg/day was tested in rabbits, the rabbits displayed increased morbidities and increased abortions   Phase 1 studies:   Type of TEAE [id number] n=32 Placebo n=14 GI disorders 7 (22%) 0 Infections 3 (9%) 0 Musculoskeletal 1 (3%) 0 Nervous system disorders 2 (6%) 0   Most common treatment-emergent adverse event (TEAE):  headache  The most common drug-related adverse events were: nausea, diarrhea, abdominal distension. These occurred in 12.5% of participants  There were no deaths or serious adverse events in any of the Phase I studies There were no clinically meaningful changes in vital signs, telemetry or laboratory parameters [phone] did not display any clinically significant effect on ECG (iIncluding Qtc, HR, RR, PR and QRS)  Study [id number] All TEAEs were mild and deemed unrelated to study drug One TEAE, viral infection, led to discontinuation of IP No deaths nor SAEs  As of the 23 October 2019 cutoff date, preliminary data from ongoing and discontinued Phase 2a studies suggest no new safety concerns.  All SAE's reported to date were considered unrelated to study drug by the investigator. All treatment-emergent SAE's were known manifestations of the underlying  diseases (IPF, PSC or ARDS) and considered by the investigator to be most likely due to disease progresssion.  Part D of the study will evaluate approximately 28 participants at the 320 mg dose for at least 24 weeks to further characterize the safety profile of [id number]. Ongoing safety and FVC assessments will continue until the last participant reaches 24 weeks of the study; therefore, safety and FVC assessments may extend to 48 weeks in participants randomized earlier in the study.    09/06/2020 Research Visit #7  This visit for Subject  Bobbiejo Ishikawa Denno with DOB: [birth year] on 09/06/2020 for the above protocol  is Visit/Encounter # 7  and is for purpose of research  . Subject/LAR expressed continued interest and consent in continuing as a study subject. Subject thanked for participation in research and contribution to science.  Continues to have ongoing urticarial rash along knees and elbows. Following with dermatology . Ongoing AE reported per protocol.  Remains active.      Allergies  Allergen Reactions   Caffeine     palpitations   Chlorpheniramine-Pseudoeph     palpitations   Maxifed     palpitations   Other Other (See Comments)   Pirfenidone Nausea Only    Esbriet Causes nausea and weakness    Immunization History  Administered Date(s) Administered   Influenza Split 11/07/2010   Influenza Whole 10/30/2009, 11/27/2011   Influenza, High Dose Seasonal PF 10/27/2012, 11/05/2013, 10/01/2017, 09/11/2018, 11/23/2019   Influenza-Unspecified 11/24/2014, 10/07/2015, 10/08/2016   PFIZER(Purple Top)SARS-COV-2 Vaccination 03/14/2019, 04/06/2019, 10/27/2019   Pneumococcal Conjugate-13 10/07/2015   Pneumococcal Polysaccharide-23 07/02/2017   Tdap 02/11/2012   Zoster Recombinat (Shingrix) 10/01/2017, 12/03/2017   Zoster, Live 01/09/2012    Past Medical History:  Diagnosis Date   Allergic rhinitis    GERD (gastroesophageal reflux disease)    Glaucoma    SUSPECT   Hiatal hernia    Hyperlipidemia    LDL goal = < 100   Hypothyroidism    ILD (interstitial lung disease) ([place])    Lichen planopilaris    MVP (mitral valve prolapse)    documented on 2 D ECHO   Restless legs    Urticaria     Tobacco History: Social History   Tobacco Use  Smoking Status Never  Smokeless Tobacco Never   Counseling given: Not Answered   Outpatient Medications Prior to Visit  Medication Sig Dispense Refill   aspirin EC 81 MG tablet Take 1 tablet (81 mg total) by mouth daily. 90 tablet 3   cetirizine (ZYRTEC) 10 MG tablet Take 10 mg by mouth daily.      cholecalciferol (VITAMIN D3) 25 MCG (1000 UT)  tablet Take 1,000 Units by mouth daily.     clobetasol (TEMOVATE) 0.05 % external solution SMARTSIG:1 Milliliter(s) Topical Twice Daily     esomeprazole (NEXIUM) 40 MG capsule Take 40 mg by mouth every morning.     famotidine (PEPCID) 40 MG tablet Take 1 tablet (40 mg total) by mouth daily. 90 tablet 1   fluticasone (FLONASE) 50 MCG/ACT nasal spray Place 1 spray into both nostrils daily as needed for allergies or rhinitis.     hydroxychloroquine (PLAQUENIL) 200 MG tablet Take 200 mg by mouth 2 (two) times daily.     magnesium oxide (MAG-OX) 400 MG tablet Take 400 mg by mouth daily.     Multiple Vitamins-Minerals (ZINC PO) Take 1 tablet by mouth.     pravastatin (PRAVACHOL) 40 MG tablet TAKE 1 TABLET BY MOUTH  DAILY 90 tablet 3   Probiotic Product (PROBIOTIC ADVANCED) CAPS Take by mouth.     rOPINIRole (REQUIP) 0.5 MG tablet TAKE 2 TABLETS BY MOUTH  AFTER DINNER EACH NIGHT. 180 tablet 3   SYNTHROID 100 MCG tablet TAKE 1 TABLET BY MOUTH  DAILY 90 tablet 3   vitamin B-12 (CYANOCOBALAMIN) 500 MCG tablet Take 500 mcg by mouth daily.     No facility-administered medications prior to visit.        Physical Exam  See research exam .    BNP No results found  for: BNP  ProBNP No results found for: PROBNP  Imaging: CT Chest High Resolution  Result Date: 08/29/2020 CLINICAL DATA:  80 year old female with history of idiopathic pulmonary fibrosis. Follow-up study. EXAM: CT CHEST WITHOUT CONTRAST TECHNIQUE: Multidetector CT imaging of the chest was performed following the standard protocol without intravenous contrast. High resolution imaging of the lungs, as well as inspiratory and expiratory imaging, was performed. COMPARISON:  Chest CT 05/13/2020. FINDINGS: Cardiovascular: Heart size is normal. There is no significant pericardial fluid, thickening or pericardial calcification. There is aortic atherosclerosis, as well as atherosclerosis of the great vessels of the mediastinum and the coronary  arteries, including calcified atherosclerotic plaque in the left main, left anterior descending and right coronary arteries. Mediastinum/Nodes: No pathologically enlarged mediastinal or hilar lymph nodes. Esophagus is unremarkable in appearance. No axillary lymphadenopathy. Lungs/Pleura: High-resolution images again demonstrate patchy areas of peripheral predominant ground-glass attenuation, septal thickening, subpleural reticulation, mild cylindrical traction bronchiectasis and peripheral bronchiolectasis. There is a suggestion of some early honeycombing, but this is not yet definitive. Findings appear relatively stable compared to the most recent prior study, but are clearly progressive compared to more remote prior examinations. No definite craniocaudal gradient, although some of the most severe fibrotic changes are evident in the lung bases. Inspiratory and expiratory imaging is unremarkable. No acute consolidative airspace disease. No pleural effusions. Upper Abdomen: Aortic atherosclerosis. Musculoskeletal: There are no aggressive appearing lytic or blastic lesions noted in the visualized portions of the skeleton. IMPRESSION: 1. 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 2. Aortic atherosclerosis, in addition to left main and 2 vessel coronary artery disease. Assessment for potential risk factor modification, dietary therapy or pharmacologic therapy may be warranted, if clinically indicated. Aortic Atherosclerosis (ICD10-I70.0). Electronically Signed   By: Vinnie Langton M.D.   On: 08/29/2020 09:59      PFT Results Latest Ref Rng & Units 03/04/2020 09/15/2019 03/16/2019 01/29/2018  FVC-Pre L 3.07 2.92 2.91 2.97  FVC-Predicted Pre % 104 99 97 98  FVC-Post L - - - 2.89  FVC-Predicted Post % - - - 95  Pre FEV1/FVC % % 78 80 79 81  Post FEV1/FCV % % - - - 83  FEV1-Pre L 2.39  2.34 2.29 2.40  FEV1-Predicted Pre % 108 106 102 105  FEV1-Post L - - - 2.39  DLCO uncorrected ml/min/mmHg 12.94 12.84 12.32 14.80  DLCO UNC% % 63 63 60 54  DLCO corrected ml/min/mmHg 12.94 13.26 12.91 -  DLCO COR %Predicted % 63 65 63 -  DLVA Predicted % 70 71 73 68    No results found for: NITRICOXIDE      Assessment & Plan:   ILD (interstitial lung disease) (HCC) Appears stable , continue on current regimen   Research study patient Continue with research protocol      Rexene Edison, NP 09/06/2020

## 2020-09-06 NOTE — Assessment & Plan Note (Signed)
Continue with research protocol

## 2020-09-06 NOTE — Research (Signed)
Title: A randomized, double-blind, multicenter, dose-ranging, placebo-controlled Phase 2a evaluation of the safety, tolerability and pharmacokinetics of [id number] in participants with idiopathic pulmonary fibrosis (IPF)   [facility] [place] Coordinator note :   This visit for Subject Elizabeth Espinoza with DOB: 03-Aug-1940 on 09/06/2020 for the above protocol is Visit # 7  and is for purpose of research.   The consent for this encounter is under Protocol Version Amendment 3.0 , Investigator Brochure Edition v.5.0 Addendum, Consent Version 5.1 and  is currently IRB approved.   Subject expressed continued interest and consent in continuing as a study subject. Subject confirmed that there was no change in contact information (e.g. address, telephone, email). Subject thanked for participation in research and contribution to science.  In this visit 09/06/2020 the subject will be evaluated by Sub-Investigator named Rexene Edison, NP  . This research coordinator has verified that the above investigator is up to date with her training logs.   The Subject was informed that the PI Dr. Chase Caller continues to have oversight of the subject's visits and course  through relevant discussions, reviews and also specifically of this visit by routing of this note to the [place].   All study visit 7 procedures and assessments were completed per the above mentioned protocol. No new AE's were reported. The AE of Worsening of urticaria is still ongoing at this time. The subject is 100% compliant with taking study medication and with completing the diary. She has no complaints today. See the subject's study binder for further details of the visit. She will return in 6 weeks for study visit 8.  Signed by Hale Drone, MS, [place] Coordinator  [place], [place] 11:36 AM 09/06/2020

## 2020-09-15 ENCOUNTER — Telehealth: Payer: Self-pay | Admitting: Pharmacist

## 2020-09-15 NOTE — Progress Notes (Signed)
[facility]   Name: Elizabeth Espinoza  MRN: [id number] DOB: [birth year]   Reason for Encounter: Disease State   Conditions to be addressed/monitored: General   Recent office visits:  07/06/20 Elizabeth Rail, MD (PCP) (Preventative health care) Return in about 1 year   Recent consult visits:  05/10/20 Elizabeth Perla, MD-Cardiology (Shortness of Breath)  Hospital visits:  None in previous 6 months  Medications: Outpatient Encounter Medications as of 09/15/2020  Medication Sig   aspirin EC 81 MG tablet Take 1 tablet (81 mg total) by mouth daily.   cetirizine (ZYRTEC) 10 MG tablet Take 10 mg by mouth daily.    cholecalciferol (VITAMIN D3) 25 MCG (1000 UT) tablet Take 1,000 Units by mouth daily.   clobetasol (TEMOVATE) 0.05 % external solution SMARTSIG:1 Milliliter(s) Topical Twice Daily   esomeprazole (NEXIUM) 40 MG capsule Take 40 mg by mouth every morning.   famotidine (PEPCID) 40 MG tablet Take 1 tablet (40 mg total) by mouth daily.   fluticasone (FLONASE) 50 MCG/ACT nasal spray Place 1 spray into both nostrils daily as needed for allergies or rhinitis.   hydroxychloroquine (PLAQUENIL) 200 MG tablet Take 200 mg by mouth 2 (two) times daily.   magnesium oxide (MAG-OX) 400 MG tablet Take 400 mg by mouth daily.   Multiple Vitamins-Minerals (ZINC PO) Take 1 tablet by mouth.   pravastatin (PRAVACHOL) 40 MG tablet TAKE 1 TABLET BY MOUTH  DAILY   Probiotic Product (PROBIOTIC ADVANCED) CAPS Take by mouth.   rOPINIRole (REQUIP) 0.5 MG tablet TAKE 2 TABLETS BY MOUTH  AFTER DINNER EACH NIGHT.   SYNTHROID 100 MCG tablet TAKE 1 TABLET BY MOUTH  DAILY   vitamin B-12 (CYANOCOBALAMIN) 500 MCG tablet Take 500 mcg by mouth daily.   [DISCONTINUED] loratadine (CLARITIN) 10 MG tablet Take 10 mg by mouth daily as needed.     [DISCONTINUED] omeprazole (PRILOSEC) 40 MG capsule Take 1 capsule (40 mg total) by mouth daily.   No facility-administered encounter  medications on file as of 09/15/2020.    Contacted Elizabeth Espinoza on 09/15/20 for general disease state and medication adherence call.  Contacted Elizabeth Espinoza on 09/19/20 for general disease state and medication adherence call.   Patient is not > 5 days past due for refill on the following medications per chart history:  [facility] Medications: Medication Name/mg Last Fill Days Supply Pravastatin 40 mg  07/29/20  90   What concerns do you have about your medications?Patient states that she does not have any concerns about medications. She stated that she is on a trial medication for her lungs by Dr. Jerilynn Espinoza  The patient denies side effects with her medications.   How often do you forget or accidentally miss a dose?  Patient states that she has missed a dose or two only because she got busy, but rarley misses any doses  Do you use a pillbox? Yes  Are you having any problems getting your medications from your pharmacy? No  Has the cost of your medications been a concern? Yes If yes, what medication and is patient assistance available or has it been applied for? Patient states that her synthroid gets a little to expensive   Since last visit with CPP, no interventions have been made:   The patient has not had an ED visit since last contact.   The patient denies problems with their health.   she denies  concerns or questions for,Elizabeth Espinoza. D at this  time.   Counseled patient on:  Importance of taking medication daily without missed doses   Care Gaps: Last annual wellness visit:07/06/20  No appointments scheduled within the next 30 days.   Elizabeth Espinoza [phone]   Time spent:33

## 2020-09-22 ENCOUNTER — Ambulatory Visit
Admission: RE | Admit: 2020-09-22 | Discharge: 2020-09-22 | Disposition: A | Discharge status: Home / Self Care | Payer: Medicare Other | Source: Ambulatory Visit | Attending: Internal Medicine | Admitting: Internal Medicine

## 2020-09-22 ENCOUNTER — Other Ambulatory Visit: Payer: Self-pay

## 2020-09-22 DIAGNOSIS — Z1231 Encounter for screening mammogram for malignant neoplasm of breast: Secondary | ICD-10-CM | POA: Diagnosis not present

## 2020-09-29 ENCOUNTER — Other Ambulatory Visit: Payer: Self-pay | Admitting: Internal Medicine

## 2020-10-03 DIAGNOSIS — L814 Other melanin hyperpigmentation: Secondary | ICD-10-CM | POA: Diagnosis not present

## 2020-10-03 DIAGNOSIS — D1801 Hemangioma of skin and subcutaneous tissue: Secondary | ICD-10-CM | POA: Diagnosis not present

## 2020-10-03 DIAGNOSIS — L720 Epidermal cyst: Secondary | ICD-10-CM | POA: Diagnosis not present

## 2020-10-03 DIAGNOSIS — D225 Melanocytic nevi of trunk: Secondary | ICD-10-CM | POA: Diagnosis not present

## 2020-10-03 DIAGNOSIS — D692 Other nonthrombocytopenic purpura: Secondary | ICD-10-CM | POA: Diagnosis not present

## 2020-10-03 DIAGNOSIS — L661 Lichen planopilaris: Secondary | ICD-10-CM | POA: Diagnosis not present

## 2020-10-03 DIAGNOSIS — L821 Other seborrheic keratosis: Secondary | ICD-10-CM | POA: Diagnosis not present

## 2020-10-03 DIAGNOSIS — L309 Dermatitis, unspecified: Secondary | ICD-10-CM | POA: Diagnosis not present

## 2020-10-07 DIAGNOSIS — H04123 Dry eye syndrome of bilateral lacrimal glands: Secondary | ICD-10-CM | POA: Diagnosis not present

## 2020-10-07 DIAGNOSIS — H16142 Punctate keratitis, left eye: Secondary | ICD-10-CM | POA: Diagnosis not present

## 2020-10-17 ENCOUNTER — Other Ambulatory Visit: Payer: Self-pay

## 2020-10-17 ENCOUNTER — Other Ambulatory Visit: Payer: Self-pay | Admitting: Internal Medicine

## 2020-10-17 ENCOUNTER — Encounter (INDEPENDENT_AMBULATORY_CARE_PROVIDER_SITE_OTHER): Payer: Medicare Other | Admitting: Adult Health

## 2020-10-17 ENCOUNTER — Encounter: Payer: Medicare Other | Admitting: *Deleted

## 2020-10-17 ENCOUNTER — Encounter: Payer: Self-pay | Admitting: Adult Health

## 2020-10-17 DIAGNOSIS — J84112 Idiopathic pulmonary fibrosis: Secondary | ICD-10-CM

## 2020-10-17 DIAGNOSIS — Z006 Encounter for examination for normal comparison and control in clinical research program: Secondary | ICD-10-CM

## 2020-10-17 DIAGNOSIS — J849 Interstitial pulmonary disease, unspecified: Secondary | ICD-10-CM

## 2020-10-17 NOTE — Assessment & Plan Note (Signed)
Appears stable

## 2020-10-17 NOTE — Progress Notes (Signed)
_0  ID: Elizabeth Espinoza, female    DOB: [birth year], 80 y.o.   MRN: [id number]  Chief Complaint  Patient presents with   Research    Referring provider: Binnie Rail, MD  HPI: Title: A randomized, double-blind, multicenter, dose-ranging, placebo-controlled Phase 2a evaluation of the safety, tolerability and pharmacokinetics of [phone] in participants with idiopathic pulmonary fibrosis (IPF)   Patients will be randomized in a 3:1 ratio to one of two treatment arms:  320 mg [id number] or placebo.   Protocol #: [id number], Clinical Trials EUDRACT# [id number]   [employer] [email or web address]   ([place] [address])     Key Inclusion Criteria:   Age >/= 79 years old Confident diagnosis of IPF  up to 5 years. If IPF diagnosis is within > 3 to = 5 years at screening, the participant must have evidence of progression within the last 24 months, as defined by decline in FVC percent predicted based on a relative decline of = 5% FVC% pred >/=45%  DLCO (hgb-adjusted) >/=30% Treatment with nintedanib or pirfenidone allowed, But must have stable dose =3 months before Screening Visit and remain unchanged during the study   Key Exclusion Criteria:   Receiving any non-approved agent intended for treatment of fibrosis in IPF FEV1 over the FVC ratio < 0.7 at screening Active infection,  that can affect FVC measurement during Screening or  Randomization Extent of emphysema > fibrotic changes on most recent HRCT IPF exacerbation within 6 months of Screening  Severe pulmonary hypertension Smoking of any kind w/in 3 months of Screening or unwilling to avoid smoking throughout the study Renal impairment or end-stage kidney disease requiring dialysis History of unstable /deteriorating cardiac/pulmonary disease (other than IPF) w/in 6 months of Screening Any ongoing known malignancy, except for localized cancers (i.e. basal cell carcinoma) Medical or surgical  condition known to affect drug absorption Surgeries scheduled during the study period    Side-Effects: [id number] was generally well tolerated in over 280 healthy participants with no clinical safety concerns or dose-limiting toxicities observed for single doses up to 633m and multiple doses up to 3233mQD administered for up to 14 days. The major side-effects reported have been headache (mild) and constipation (mild) in 3-76% of subjects. Other side effects, in 1-2% of subjects, were mild to moderate, with fever being the only severe side-effect reported.  Mechanism: Selective dual aV6 and aV1 integrin inhibitor with antifibrotic activity. [id number] aV6 and aV1 from binding to the RGD sequence of latency-associated peptide (LAP), this blocks the release of activated TGF-1, thereby blocking the activation of pro-fibrotic pathways. Additionally, PLXMI-68032as been shown to reduce collagen synthesis and gene expression in human IPF tissue.   Interactions: Acts as a substrate of P-gp, OATP1B1, OATP1B3, OAT3 Inhibitors or inducers of these enzymes may affect the intestinal absorption, hepatic uptake or renal uptake of [id number] by CYP3A4, CYP2C9, CYP2C19 Potent inhibitors or inducers of these enzymes should be avoided Each CYP enzyme metabolizes < 10% of the parent compound Since multiple CYP enzymes are involved, clinically relevant drug-drug interactions are less likely, as there are other alternative pathways A potent inhibitor of multiple pathways should be used with caution as that has the potential to elicit a clinically relevant drug-drug interaction i.e. medication that inhibits CYP3A4, CYP2C9, CYP2C19 [id number]-04888s not an inducer of CYP1A2, CYP2B6, or CYP3A4 Showed moderate inhibition of P-gp and MATE1, substrates of P-gp and MATE1 may  have increased exposure when coadministered with [id number] by extrahepatic enzymes:  CYP1A1, UGT1A7 Clinical relevance  relating to drug interactions is unknown Stable when incubated with human recombinant MAO-A and MAO-B [id number] is not an inhibitor or inducer of CYP isoforms and may be co-administered with substrates of CYP isoforms without risk of DDI.  Interactions with specific drugs: Potential for increased exposure of nintedanib Recommend separating time of administration of PIR-51884 and nintedanib Drug interactions with pirfenidone are not expected Use of corticosteroids should be discussed beforehand with the medical monitor   Clinical drug interactions studies have not been performed at this time and therefore guidance is based on the in-vitro studies  Disallowed Medications/Foods/Supplements: Fluconazole Digoxin Rifampin Grapefruit Grapefruit-containing foods and beverages St. John's Wort   Pharmacokinetics: Tmax : 2-3 hours Half-life: approximately 48 hours Steady state reached in 5-7 days Minimal renal clearance (8.5% of drug is cleared by the kidneys)    Administration [phone] should be administered on an empty stomach May be administered via feeding tube   Safety:   Edition Addendum to Edition 5.0 - 12 February 2020   Animal studies: No effect on neurobehavior No effect on respiratory parameters No effects on blood pressure Showed an increase in QT and QTc The high dose 400 mg/kg showed an increase in mean heart rate   Cardiovascular effects in mice after administration of [id number]: Dose-dependent increases in blood pressure Increase in PR and QRS duration Increase in arrhythmias  Reproductive animal toxicology studies at the low and medium doses, 150 and 300 mg/kg/day, revealed no impact on: maternal survival, fetal body weight, food parameters, uterine and ovarian parameters, no external malformations  When the high dose 600 mg/kg/day was tested in rabbits, the rabbits displayed increased morbidities and increased abortions   Phase 1 studies:   Type of  TEAE [id number] n=32 Placebo n=14 GI disorders 7 (22%) 0 Infections 3 (9%) 0 Musculoskeletal 1 (3%) 0 Nervous system disorders 2 (6%) 0   Most common treatment-emergent adverse event (TEAE):  headache  The most common drug-related adverse events were: nausea, diarrhea, abdominal distension. These occurred in 12.5% of participants  There were no deaths or serious adverse events in any of the Phase I studies There were no clinically meaningful changes in vital signs, telemetry or laboratory parameters [phone] did not display any clinically significant effect on ECG (iIncluding Qtc, HR, RR, PR and QRS)  Study [id number] All TEAEs were mild and deemed unrelated to study drug One TEAE, viral infection, led to discontinuation of IP No deaths nor SAEs  As of the 23 October 2019 cutoff date, preliminary data from ongoing and discontinued Phase 2a studies suggest no new safety concerns.  All SAE's reported to date were considered unrelated to study drug by the investigator. All treatment-emergent SAE's were known manifestations of the underlying  diseases (IPF, PSC or ARDS) and considered by the investigator to be most likely due to disease progresssion.  Part D of the study will evaluate approximately 28 participants at the 320 mg dose for at least 24 weeks to further characterize the safety profile of [id number]. Ongoing safety and FVC assessments will continue until the last participant reaches 24 weeks of the study; therefore, safety and FVC assessments may extend to 48 weeks in participants randomized earlier in the study.   10/17/2020 Research Visit #8  This visit for Subject Elizabeth Espinoza with DOB: [birth year] on 10/17/2020 for the above protocol is Visit/Encounter # 8  and is for purpose of Research  . Subject/LAR expressed continued interest and consent in continuing as a study  subject. Subject thanked for participation in research and contribution to science. Continues to  have an ongoing urticarial rash along the knees and elbows.  Does feel that it is better since last visit.  Ongoing a reported poor protocol.  Patient remains very active.  Exercises on a routine basis.      Allergies  Allergen Reactions   Caffeine     palpitations   Chlorpheniramine-Pseudoeph     palpitations   Maxifed     palpitations   Other Other (See Comments)   Pirfenidone Nausea Only    Esbriet Causes nausea and weakness    Immunization History  Administered Date(s) Administered   Influenza Split 11/07/2010   Influenza Whole 10/30/2009, 11/27/2011   Influenza, High Dose Seasonal PF 10/27/2012, 11/05/2013, 10/01/2017, 09/11/2018, 11/23/2019   Influenza-Unspecified 11/24/2014, 10/07/2015, 10/08/2016   PFIZER(Purple Top)SARS-COV-2 Vaccination 03/14/2019, 04/06/2019, 10/27/2019   Pneumococcal Conjugate-13 10/07/2015   Pneumococcal Polysaccharide-23 07/02/2017   Tdap 02/11/2012   Zoster Recombinat (Shingrix) 10/01/2017, 12/03/2017   Zoster, Live 01/09/2012    Past Medical History:  Diagnosis Date   Allergic rhinitis    GERD (gastroesophageal reflux disease)    Glaucoma    SUSPECT   Hiatal hernia    Hyperlipidemia    LDL goal = < 100   Hypothyroidism    ILD (interstitial lung disease) ([place])    Lichen planopilaris    MVP (mitral valve prolapse)    documented on 2 D ECHO   Restless legs    Urticaria     Tobacco History: Social History   Tobacco Use  Smoking Status Never  Smokeless Tobacco Never   Counseling given: Not Answered   Outpatient Medications Prior to Visit  Medication Sig Dispense Refill   aspirin EC 81 MG tablet Take 1 tablet (81 mg total) by mouth daily. 90 tablet 3   cetirizine (ZYRTEC) 10 MG tablet Take 10 mg by mouth daily.      cholecalciferol (VITAMIN D3) 25 MCG (1000 UT) tablet Take 1,000 Units by mouth daily.     clobetasol (TEMOVATE) 0.05 % external solution SMARTSIG:1 Milliliter(s) Topical Twice Daily     esomeprazole (NEXIUM) 40  MG capsule Take 40 mg by mouth every morning.     famotidine (PEPCID) 40 MG tablet Take 1 tablet (40 mg total) by mouth daily. 90 tablet 1   fluticasone (FLONASE) 50 MCG/ACT nasal spray Place 1 spray into both nostrils daily as needed for allergies or rhinitis.     hydroxychloroquine (PLAQUENIL) 200 MG tablet Take 200 mg by mouth 2 (two) times daily.     magnesium oxide (MAG-OX) 400 MG tablet Take 400 mg by mouth daily.     Multiple Vitamins-Minerals (ZINC PO) Take 1 tablet by mouth.     pravastatin (PRAVACHOL) 40 MG tablet TAKE 1 TABLET BY MOUTH  DAILY 90 tablet 3   Probiotic Product (PROBIOTIC ADVANCED) CAPS Take by mouth.     rOPINIRole (REQUIP) 0.5 MG tablet TAKE 2 TABLETS BY MOUTH  AFTER DINNER EACH NIGHT. 180 tablet 3   SYNTHROID 100 MCG tablet TAKE 1 TABLET BY MOUTH  DAILY 90 tablet 3   vitamin B-12 (CYANOCOBALAMIN) 500 MCG tablet Take 500 mcg by mouth daily.     No facility-administered medications prior to visit.      Physical Exam See research exam    Lab Results:      BNP No results found for: BNP  ProBNP No results found for: PROBNP  Imaging:    PFT Results Latest  Ref Rng & Units 03/04/2020 09/15/2019 03/16/2019 01/29/2018  FVC-Pre L 3.07 2.92 2.91 2.97  FVC-Predicted Pre % 104 99 97 98  FVC-Post L - - - 2.89  FVC-Predicted Post % - - - 95  Pre FEV1/FVC % % 78 80 79 81  Post FEV1/FCV % % - - - 83  FEV1-Pre L 2.39 2.34 2.29 2.40  FEV1-Predicted Pre % 108 106 102 105  FEV1-Post L - - - 2.39  DLCO uncorrected ml/min/mmHg 12.94 12.84 12.32 14.80  DLCO UNC% % 63 63 60 54  DLCO corrected ml/min/mmHg 12.94 13.26 12.91 -  DLCO COR %Predicted % 63 65 63 -  DLVA Predicted % 70 71 73 68    No results found for: NITRICOXIDE      Assessment & Plan:   Research study patient Continue research protocol  ILD (interstitial lung disease) ([place]) Appears stable     Cathryne Mancebo, NP 10/17/2020

## 2020-10-17 NOTE — Research (Signed)
Title: A randomized, double-blind, multicenter, dose-ranging, placebo-controlled Phase 2a evaluation of the safety, tolerability and pharmacokinetics of [id number] in participants with idiopathic pulmonary fibrosis (IPF)   [facility] [place] Coordinator note :   This visit for Subject Elizabeth Espinoza with DOB: October 05, 1940 on 10/17/2020 for the above protocol is Visit # 8  and is for purpose of research.   The consent for this encounter is under Protocol Version Amendment 3.0 , Investigator Brochure Edition V5.0 Addendum, Consent Version 5.1 and is currently IRB approved.   Subject expressed continued interest and consent in continuing as a study subject. Subject confirmed that there was no change in contact information (e.g. address, telephone, email). Subject thanked for participation in research and contribution to science.  In this visit 10/17/2020 the subject will be evaluated by Sub-Investigator named Rexene Edison, NP  . This research coordinator has verified that the above investigator is up to date with her training logs.   The Subject was informed that the PI Dr. Chase Caller continues to have oversight of the subject's visits and course  through relevant discussions, reviews and also specifically of this visit by routing of this note to the [place].  All study visit 8 procedures and assessments were completed per the above mentioned protocol. No new AE's were reported. The AE of worsening urticaria is still ongoing but is better. The subject is 100% compliant with taking study medication and with completing the diary. Please refer to the subject's study binder for further details of the visit. She will return in 4 weeks for study visit 9.  Signed by Hale Drone, MS, [place] Coordinator  [facility]  [place], [place] 1:19 PM 10/17/2020

## 2020-10-17 NOTE — Assessment & Plan Note (Signed)
Continue research protocol

## 2020-10-20 ENCOUNTER — Other Ambulatory Visit: Payer: Self-pay

## 2020-10-20 ENCOUNTER — Ambulatory Visit (INDEPENDENT_AMBULATORY_CARE_PROVIDER_SITE_OTHER): Payer: Medicare Other

## 2020-10-20 VITALS — BP 120/60 | HR 56 | Temp 98.2°F | Ht 66.0 in | Wt 141.0 lb

## 2020-10-20 DIAGNOSIS — Z0000 Encounter for general adult medical examination without abnormal findings: Secondary | ICD-10-CM

## 2020-10-20 NOTE — Progress Notes (Signed)
Subjective:   Elizabeth Espinoza is a 80 y.o. female who presents for Medicare Annual (Subsequent) preventive examination.  Review of Systems     Cardiac Risk Factors include: advanced age (>74mn, >>28women);dyslipidemia;family history of premature cardiovascular disease     Objective:    Today's Vitals   10/20/20 0928  BP: 120/60  Pulse: (!) 56  Temp: 98.2 F (36.8 C)  Weight: 141 lb (64 kg)  Height: _0  (1.676 m)  PainSc: 0-No pain   Body mass index is 22.76 kg/m.  Advanced Directives 10/20/2020 11/06/2019 07/09/2019 02/05/2019 11/12/2018 07/03/2018 07/02/2017  Does Patient Have a Medical Advance Directive? Yes Yes Yes Yes No;Yes Yes Yes  Type of Advance Directive Living will;Healthcare Power of Attorney Living will - Living will [place] will  Does patient want to make changes to medical advance directive? No - Patient declined - No - Patient declined - - - -  Copy of [place]? No - copy requested - - - No - copy requested No - copy requested No - copy requested    Current Medications (verified) Outpatient Encounter Medications as of 10/20/2020  Medication Sig   aspirin EC 81 MG tablet Take 1 tablet (81 mg total) by mouth daily.   cetirizine (ZYRTEC) 10 MG tablet Take 10 mg by mouth daily.    cholecalciferol (VITAMIN D3) 25 MCG (1000 UT) tablet Take 1,000 Units by mouth daily.   clobetasol (TEMOVATE) 0.05 % external solution SMARTSIG:1 Milliliter(s) Topical Twice Daily   esomeprazole (NEXIUM) 40 MG capsule Take 40 mg by mouth every morning.   famotidine (PEPCID) 40 MG tablet Take 1 tablet (40 mg total) by mouth daily.   fluticasone (FLONASE) 50 MCG/ACT nasal spray Place 1 spray into both nostrils daily as needed for allergies or rhinitis.   hydroxychloroquine (PLAQUENIL) 200 MG tablet Take 200 mg by mouth 2 (two) times daily.   magnesium  oxide (MAG-OX) 400 MG tablet Take 400 mg by mouth daily.   Multiple Vitamins-Minerals (ZINC PO) Take 1 tablet by mouth.   pravastatin (PRAVACHOL) 40 MG tablet TAKE 1 TABLET BY MOUTH  DAILY   Probiotic Product (PROBIOTIC ADVANCED) CAPS Take by mouth.   rOPINIRole (REQUIP) 0.5 MG tablet TAKE 2 TABLETS BY MOUTH  AFTER DINNER EACH NIGHT.   SYNTHROID 100 MCG tablet TAKE 1 TABLET BY MOUTH  DAILY   vitamin B-12 (CYANOCOBALAMIN) 500 MCG tablet Take 500 mcg by mouth daily.   [DISCONTINUED] loratadine (CLARITIN) 10 MG tablet Take 10 mg by mouth daily as needed.     [DISCONTINUED] omeprazole (PRILOSEC) 40 MG capsule Take 1 capsule (40 mg total) by mouth daily.   No facility-administered encounter medications on file as of 10/20/2020.    Allergies (verified) Caffeine, Chlorpheniramine-pseudoeph, Maxifed, Other, and Pirfenidone   History: Past Medical History:  Diagnosis Date   Allergic rhinitis    GERD (gastroesophageal reflux disease)    Glaucoma    SUSPECT   Hiatal hernia    Hyperlipidemia    LDL goal = < 100   Hypothyroidism    ILD (interstitial lung disease) (HCC)    Lichen planopilaris    MVP (mitral valve prolapse)    documented on 2 D ECHO   Restless legs    Urticaria    Past Surgical History:  Procedure Laterality Date   APPENDECTOMY     with TDubberly  negative   COLONOSCOPY  2005   Dr Olevia Perches   COLONOSCOPY  01/2013   Dr Collene Mares   esophageal dilation  07/04/2011   Dr Estrella Myrtle SIGMOIDOSCOPY  1999    Dr Olevia Perches   KNEE ARTHROSCOPY  2001, 2005   Dr Percell Miller   bilat   TOTAL ABDOMINAL HYSTERECTOMY     for Endometriosis; no BSO, Dr Colin Ina   UPPER GI ENDOSCOPY  01/2013   Dr Collene Mares   VIDEO BRONCHOSCOPY Bilateral 02/05/2019   Procedure: VIDEO BRONCHOSCOPY WITH FLUORO;  Surgeon: Brand Males, MD;  Location: [facility];  Service: Endoscopy;  Laterality: Bilateral;   Family History  Problem Relation Age of Onset   Lung cancer Father         Asbestos with mesothelioma   Heart disease Paternal Grandmother        ? etiology   Diabetes Paternal Grandmother    Diabetes Brother    Uterine cancer Paternal Aunt    Stroke Paternal Grandfather        > 78   Heart attack Brother 85   Allergies Sister    Aneurysm Mother        congenital   Stroke Brother        two strokes   Barrett's esophagus Brother    Social History   Socioeconomic History   Marital status: Married    Spouse name: Jeneen Rinks   Number of children: 2   Years of education: Not on file   Highest education level: Not on file  Occupational History   Occupation: retired from [employer] retired  Tobacco Use   Smoking status: Never   Smokeless tobacco: Never  Vaping Use   Vaping Use: Never used  Substance and Sexual Activity   Alcohol use: Yes    Comment:  rarely   Drug use: No   Sexual activity: Yes  Other Topics Concern   Not on file  Social History Narrative   Not on file   Social Determinants of Health   Financial Resource Strain: Low Risk    Difficulty of Paying Living Expenses: Not hard at all  Food Insecurity: No Food Insecurity   Worried About Charity fundraiser in the Last Year: Never true   [place] in the Last Year: Never true  Transportation Needs: No Transportation Needs   Lack of Transportation (Medical): No   Lack of Transportation (Non-Medical): No  Physical Activity: Sufficiently Active   Days of Exercise per Week: 7 days   Minutes of Exercise per Session: 30 min  Stress: No Stress Concern Present   Feeling of Stress : Not at all  Social Connections: Moderately Isolated   Frequency of Communication with Friends and Family: More than three times a week   Frequency of Social Gatherings with Friends and Family: More than three times a week   Attends Religious Services: Never   Marine scientist or Organizations: No   Attends Music therapist: Never   Marital Status: Married    Tobacco  Counseling Counseling given: Not Answered   Clinical Intake:  Pre-visit preparation completed: Yes  Pain Score: 0-No pain     BMI - recorded: 22.76 Nutritional Status: BMI of 19-24  Normal Nutritional Risks: None Diabetes: No  How often do you need to have someone help you when you read instructions, pamphlets, or other written materials from your doctor or pharmacy?: 1 - Never What is the last grade level you completed  in school?: [employer]; Retired from [employer]  Diabetic? no  Interpreter Needed?: No  Information entered by :: [employer], LPN.   Activities of Daily Living In your present state of health, do you have any difficulty performing the following activities: 10/20/2020  Hearing? Y  Vision? N  Difficulty concentrating or making decisions? N  Walking or climbing stairs? N  Dressing or bathing? N  Doing errands, shopping? N  Preparing Food and eating ? N  Using the Toilet? N  In the past six months, have you accidently leaked urine? N  Do you have problems with loss of bowel control? N  Managing your Medications? N  Managing your Finances? N  Housekeeping or managing your Housekeeping? N  Some recent data might be hidden    Patient Care Team: Binnie Rail, MD as PCP - General (Internal Medicine) Stanford Breed Denice Bors, MD as Consulting Physician (Cardiology) Collene Gobble, MD as Consulting Physician (Pulmonary Disease) Charlton Haws, [facility] as Pharmacist (Pharmacist) Melida Quitter, MD as Consulting Physician (Otolaryngology) [facility] as Consulting Physician (Ophthalmology)  Indicate any recent Medical Services you may have received from other than [facility] providers in the past year (date may be approximate).     Assessment:   This is a routine wellness examination for Belissa.  Hearing/Vision screen Hearing Screening - Comments:: Patient stated she is having hearing difficulty.  Saw Dr. Melida Quitter for  audiology consult. No hearing aids.  Vision Screening - Comments:: Patient does not wear any corrective glasses/contacts.  Eye exam done annually by: [facility]  Dietary issues and exercise activities discussed: Current Exercise Habits: Home exercise routine, Type of exercise: walking;stretching;Other - see comments;strength training/weights (stationary bike, curls, 5 lb weights, walking at least 5-7 days week.), Time (Minutes): 30, Frequency (Times/Week): 7, Weekly Exercise (Minutes/Week): 210, Intensity: Mild, Exercise limited by: respiratory conditions(s)   Goals Addressed             This Visit's Progress    Patient Stated       My goal is to keep getting better with my breathing.      Depression Screen PHQ 2/9 Scores 10/20/2020 01/07/2020 01/07/2020 11/06/2019 11/06/2019 07/09/2019 07/04/2018  PHQ - 2 Score 0 0 0 0 0 0 0  PHQ- 9 Score - 2 - 6 - - -    Fall Risk Fall Risk  10/20/2020 11/06/2019 07/09/2019 11/18/2018 07/04/2018  Falls in the past year? 0 0 0 0 0  Comment - - - - -  Number falls in past yr: 0 0 0 0 0  Injury with Fall? 0 - 0 - -  Risk for fall due to : No Fall Risks - No Fall Risks - -  Follow up Falls evaluation completed Falls evaluation completed;Education provided;Falls prevention discussed;Follow up appointment Falls evaluation completed;Education provided - -    FALL RISK PREVENTION PERTAINING TO THE HOME:  Any stairs in or around the home? Yes  If so, are there any without handrails? No  Home free of loose throw rugs in walkways, pet beds, electrical cords, etc? Yes  Adequate lighting in your home to reduce risk of falls? Yes   ASSISTIVE DEVICES UTILIZED TO PREVENT FALLS:  Life alert? No  Use of a cane, walker or w/c? No  Grab bars in the bathroom? Yes  Shower chair or bench in shower? No  Elevated toilet seat or a handicapped toilet? Yes   TIMED UP AND GO:  Was  the test performed? Yes .  Length of time to ambulate 10 feet: 6  sec.   Gait steady and fast without use of assistive device  Cognitive Function: Normal cognitive status assessed by direct observation by this Nurse Health Advisor. No abnormalities found.       6CIT Screen 07/09/2019  What Year? 0 points  What month? 0 points  What time? 0 points  Count back from 20 0 points  Months in reverse 0 points  Repeat phrase 0 points  Total Score 0    Immunizations Immunization History  Administered Date(s) Administered   Influenza Split 11/07/2010   Influenza Whole 10/30/2009, 11/27/2011   Influenza, High Dose Seasonal PF 10/27/2012, 11/05/2013, 10/01/2017, 09/11/2018, 11/23/2019   Influenza-Unspecified 11/24/2014, 10/07/2015, 10/08/2016   PFIZER(Purple Top)SARS-COV-2 Vaccination 03/14/2019, 04/06/2019, 10/27/2019   Pneumococcal Conjugate-13 10/07/2015   Pneumococcal Polysaccharide-23 07/02/2017   Tdap 02/11/2012   Zoster Recombinat (Shingrix) 10/01/2017, 12/03/2017   Zoster, Live 01/09/2012    TDAP status: Up to date  Flu Vaccine status: Up to date  Pneumococcal vaccine status: Up to date  Covid-19 vaccine status: Completed vaccines  Qualifies for Shingles Vaccine? Yes   Zostavax completed Yes   Shingrix Completed?: Yes  Screening Tests Health Maintenance  Topic Date Due   COVID-19 Vaccine (4 - Booster for Pfizer series) 01/19/2020   INFLUENZA VACCINE  08/22/2020   DEXA SCAN  11/05/2021   TETANUS/TDAP  02/10/2022   Hepatitis C Screening  Completed   Zoster Vaccines- Shingrix  Completed   HPV VACCINES  Aged Out    Health Maintenance  Health Maintenance Due  Topic Date Due   COVID-19 Vaccine (4 - Booster for Pfizer series) 01/19/2020   INFLUENZA VACCINE  08/22/2020    Colorectal cancer screening: No longer required.   Mammogram status: Completed 09/22/2020. Repeat every year  Bone Density status: Completed 11/05/2016. Results reflect: Bone density results: NORMAL. Repeat every 5 years.  Lung Cancer Screening: (Low Dose CT  Chest recommended if Age 25-80 years, 30 pack-year currently smoking OR have quit w/in 15years.) does not qualify.   Lung Cancer Screening Referral: already a pulmonary patient  Additional Screening:  Hepatitis C Screening: does qualify; Completed: yes  Vision Screening: Recommended annual ophthalmology exams for early detection of glaucoma and other disorders of the eye. Is the patient up to date with their annual eye exam?  Yes  Who is the provider or what is the name of the office in which the patient attends annual eye exams? [place] If pt is not established with a provider, would they like to be referred to a provider to establish care? No .   Dental Screening: Recommended annual dental exams for proper oral hygiene  Community Resource Referral / Chronic Care Management: CRR required this visit?  No   CCM required this visit?  No      Plan:     I have personally reviewed and noted the following in the patient's chart:   Medical and social history Use of alcohol, tobacco or illicit drugs  Current medications and supplements including opioid prescriptions.  Functional ability and status Nutritional status Physical activity Advanced directives List of other physicians Hospitalizations, surgeries, and ER visits in previous 12 months Vitals Screenings to include cognitive, depression, and falls Referrals and appointments  In addition, I have reviewed and discussed with patient certain preventive protocols, quality metrics, and best practice recommendations. A written personalized care plan for preventive services as well as general preventive health  recommendations were provided to patient.     Sheral Flow, LPN   03/17/1144   Nurse Notes:  Hearing Screening - Comments:: Patient stated she is having hearing difficulty.  Saw Dr. Melida Quitter for audiology consult. No hearing aids.  Vision Screening - Comments:: Patient does not wear any  corrective glasses/contacts.  Eye exam done annually by: [facility].

## 2020-10-20 NOTE — Patient Instructions (Signed)
Elizabeth Espinoza , Thank you for taking time to come for your Medicare Wellness Visit. I appreciate your ongoing commitment to your health goals. Please review the following plan we discussed and let me know if I can assist you in the future.   Screening recommendations/referrals: Colonoscopy: Not a candidate for screening due to age 80: 09/22/2020; due every year Bone Density: 11/05/2016; due every 5 years Recommended yearly ophthalmology/optometry visit for glaucoma screening and checkup Recommended yearly dental visit for hygiene and checkup  Vaccinations: Influenza vaccine: 11/23/2019; due Fall 2022 Pneumococcal vaccine: 10/07/2015, 07/02/2017 Tdap vaccine: 02/11/2012; due every 10 years Shingles vaccine: 10/01/2017, 12/03/2017   Covid-19: 03/14/2019, 04/06/2019, 10/27/2019  Advanced directives: Please bring a copy of your health care power of attorney and living will to the office at your convenience.  Conditions/risks identified: Yes; Client understands the importance of follow-up with providers by attending scheduled visits and discussed goals to eat healthier, increase physical activity, exercise the brain, socialize more, get enough sleep and make time for laughter.  Next appointment: Please schedule your next Medicare Wellness Visit with your Nurse Health Advisor in 1 year by calling [phone].   Preventive Care 79 Years and Older, Female Preventive care refers to lifestyle choices and visits with your health care provider that can promote health and wellness. What does preventive care include? A yearly physical exam. This is also called an annual well check. Dental exams once or twice a year. Routine eye exams. Ask your health care provider how often you should have your eyes checked. Personal lifestyle choices, including: Daily care of your teeth and gums. Regular physical activity. Eating a healthy diet. Avoiding tobacco and drug use. Limiting alcohol use. Practicing safe  sex. Taking low-dose aspirin every day. Taking vitamin and mineral supplements as recommended by your health care provider. What happens during an annual well check? The services and screenings done by your health care provider during your annual well check will depend on your age, overall health, lifestyle risk factors, and family history of disease. Counseling  Your health care provider may ask you questions about your: Alcohol use. Tobacco use. Drug use. Emotional well-being. Home and relationship well-being. Sexual activity. Eating habits. History of falls. Memory and ability to understand (cognition). Work and work Statistician. Reproductive health. Screening  You may have the following tests or measurements: Height, weight, and BMI. Blood pressure. Lipid and cholesterol levels. These may be checked every 5 years, or more frequently if you are over 82 years old. Skin check. Lung cancer screening. You may have this screening every year starting at age 67 if you have a 30-pack-year history of smoking and currently smoke or have quit within the past 15 years. Fecal occult blood test (FOBT) of the stool. You may have this test every year starting at age 36. Flexible sigmoidoscopy or colonoscopy. You may have a sigmoidoscopy every 5 years or a colonoscopy every 10 years starting at age 21. Hepatitis C blood test. Hepatitis B blood test. Sexually transmitted disease (STD) testing. Diabetes screening. This is done by checking your blood sugar (glucose) after you have not eaten for a while (fasting). You may have this done every 1-3 years. Bone density scan. This is done to screen for osteoporosis. You may have this done starting at age 18. Mammogram. This may be done every 1-2 years. Talk to your health care provider about how often you should have regular mammograms. Talk with your health care provider about your test results, treatment options, and if  necessary, the need for more  tests. Vaccines  Your health care provider may recommend certain vaccines, such as: Influenza vaccine. This is recommended every year. Tetanus, diphtheria, and acellular pertussis (Tdap, Td) vaccine. You may need a Td booster every 10 years. Zoster vaccine. You may need this after age 27. Pneumococcal 13-valent conjugate (PCV13) vaccine. One dose is recommended after age 68. Pneumococcal polysaccharide (PPSV23) vaccine. One dose is recommended after age 47. Talk to your health care provider about which screenings and vaccines you need and how often you need them. This information is not intended to replace advice given to you by your health care provider. Make sure you discuss any questions you have with your health care provider. Document Released: 02/04/2015 Document Revised: 09/28/2015 Document Reviewed: 11/09/2014 Elsevier Interactive Patient Education  2017 [place] Prevention in the Home Falls can cause injuries. They can happen to people of all ages. There are many things you can do to make your home safe and to help prevent falls. What can I do on the outside of my home? Regularly fix the edges of walkways and driveways and fix any cracks. Remove anything that might make you trip as you walk through a door, such as a raised step or threshold. Trim any bushes or trees on the path to your home. Use bright outdoor lighting. Clear any walking paths of anything that might make someone trip, such as rocks or tools. Regularly check to see if handrails are loose or broken. Make sure that both sides of any steps have handrails. Any raised decks and porches should have guardrails on the edges. Have any leaves, snow, or ice cleared regularly. Use sand or salt on walking paths during winter. Clean up any spills in your garage right away. This includes oil or grease spills. What can I do in the bathroom? Use night lights. Install grab bars by the toilet and in the tub and shower.  Do not use towel bars as grab bars. Use non-skid mats or decals in the tub or shower. If you need to sit down in the shower, use a plastic, non-slip stool. Keep the floor dry. Clean up any water that spills on the floor as soon as it happens. Remove soap buildup in the tub or shower regularly. Attach bath mats securely with double-sided non-slip rug tape. Do not have throw rugs and other things on the floor that can make you trip. What can I do in the bedroom? Use night lights. Make sure that you have a light by your bed that is easy to reach. Do not use any sheets or blankets that are too big for your bed. They should not hang down onto the floor. Have a firm chair that has side arms. You can use this for support while you get dressed. Do not have throw rugs and other things on the floor that can make you trip. What can I do in the kitchen? Clean up any spills right away. Avoid walking on wet floors. Keep items that you use a lot in easy-to-reach places. If you need to reach something above you, use a strong step stool that has a grab bar. Keep electrical cords out of the way. Do not use floor polish or wax that makes floors slippery. If you must use wax, use non-skid floor wax. Do not have throw rugs and other things on the floor that can make you trip. What can I do with my stairs? Do not leave any items  on the stairs. Make sure that there are handrails on both sides of the stairs and use them. Fix handrails that are broken or loose. Make sure that handrails are as long as the stairways. Check any carpeting to make sure that it is firmly attached to the stairs. Fix any carpet that is loose or worn. Avoid having throw rugs at the top or bottom of the stairs. If you do have throw rugs, attach them to the floor with carpet tape. Make sure that you have a light switch at the top of the stairs and the bottom of the stairs. If you do not have them, ask someone to add them for you. What else  can I do to help prevent falls? Wear shoes that: Do not have high heels. Have rubber bottoms. Are comfortable and fit you well. Are closed at the toe. Do not wear sandals. If you use a stepladder: Make sure that it is fully opened. Do not climb a closed stepladder. Make sure that both sides of the stepladder are locked into place. Ask someone to hold it for you, if possible. Clearly mark and make sure that you can see: Any grab bars or handrails. First and last steps. Where the edge of each step is. Use tools that help you move around (mobility aids) if they are needed. These include: Canes. Walkers. Scooters. Crutches. Turn on the lights when you go into a dark area. Replace any light bulbs as soon as they burn out. Set up your furniture so you have a clear path. Avoid moving your furniture around. If any of your floors are uneven, fix them. If there are any pets around you, be aware of where they are. Review your medicines with your doctor. Some medicines can make you feel dizzy. This can increase your chance of falling. Ask your doctor what other things that you can do to help prevent falls. This information is not intended to replace advice given to you by your health care provider. Make sure you discuss any questions you have with your health care provider. Document Released: 11/04/2008 Document Revised: 06/16/2015 Document Reviewed: 02/12/2014 Elsevier Interactive Patient Education  2017 [employer].

## 2020-11-14 ENCOUNTER — Ambulatory Visit (INDEPENDENT_AMBULATORY_CARE_PROVIDER_SITE_OTHER)
Admission: RE | Admit: 2020-11-14 | Discharge: 2020-11-14 | Disposition: A | Discharge status: Home / Self Care | Payer: Self-pay | Source: Ambulatory Visit | Attending: Internal Medicine | Admitting: Internal Medicine

## 2020-11-14 ENCOUNTER — Other Ambulatory Visit: Payer: Self-pay

## 2020-11-14 ENCOUNTER — Encounter (INDEPENDENT_AMBULATORY_CARE_PROVIDER_SITE_OTHER): Payer: Medicare Other | Admitting: Internal Medicine

## 2020-11-14 ENCOUNTER — Encounter: Payer: Medicare Other | Admitting: *Deleted

## 2020-11-14 ENCOUNTER — Encounter: Payer: Medicare Other | Admitting: Adult Health

## 2020-11-14 DIAGNOSIS — J479 Bronchiectasis, uncomplicated: Secondary | ICD-10-CM | POA: Diagnosis not present

## 2020-11-14 DIAGNOSIS — J84112 Idiopathic pulmonary fibrosis: Secondary | ICD-10-CM

## 2020-11-14 DIAGNOSIS — I700 Atherosclerosis of aorta: Secondary | ICD-10-CM | POA: Diagnosis not present

## 2020-11-14 DIAGNOSIS — R059 Cough, unspecified: Secondary | ICD-10-CM

## 2020-11-14 DIAGNOSIS — Z006 Encounter for examination for normal comparison and control in clinical research program: Secondary | ICD-10-CM
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
## 2020-11-14 NOTE — Patient Instructions (Addendum)
ICD-10-CM   1. Research study patient  Z00.6     2. IPF (idiopathic pulmonary fibrosis) ([place])  J84.112     3. Cough, unspecified type  R05.9

## 2020-11-28 ENCOUNTER — Other Ambulatory Visit: Payer: Self-pay

## 2020-11-28 ENCOUNTER — Encounter: Payer: Medicare Other | Admitting: *Deleted

## 2020-11-28 ENCOUNTER — Encounter: Payer: Medicare Other | Admitting: Adult Health

## 2020-11-28 ENCOUNTER — Encounter (INDEPENDENT_AMBULATORY_CARE_PROVIDER_SITE_OTHER): Payer: Medicare Other | Admitting: Primary Care

## 2020-11-28 DIAGNOSIS — J84112 Idiopathic pulmonary fibrosis: Secondary | ICD-10-CM

## 2020-11-28 DIAGNOSIS — Z006 Encounter for examination for normal comparison and control in clinical research program: Secondary | ICD-10-CM

## 2020-11-28 NOTE — Research (Signed)
Title: A randomized, double-blind, multicenter, dose-ranging, placebo-controlled Phase 2a evaluation of the safety, tolerability and pharmacokinetics of [id number] in participants with idiopathic pulmonary fibrosis (IPF)   Patients will be randomized in a 3:1 ratio to one of two treatment arms:  320 mg [id number] or placebo.   [facility] [place] Coordinator note :   This visit for Subject Elizabeth Espinoza with DOB: 1940-12-19 on 11/28/2020 for the above protocol is Visit #10  and is for purpose of research.   The consent for this encounter is under Protocol Version Amendment 3.0 , Investigator Brochure Edition v.5.0 Addendum,  Consent Version 5.1 and is currently IRB approved.   Subject expressed continued interest and consent in continuing as a study subject. Subject confirmed that there was no change in contact information (e.g. address, telephone, email). Subject thanked for participation in research and contribution to science.  In this visit 11/28/2020 the subject will be evaluated by Sub-Investigator named Geraldo Pitter, NP. This research coordinator has verified that the above investigator is up to date with her training logs.   The Subject was informed that the PI Dr. Chase Caller continues to have oversight of the subject's visits and course  through relevant discussions, reviews and also specifically of this visit by routing of this note to the [place].  All study visit 10 procedures and assessments were completed per the above protocol. No new AE's were reported. The AE of worsening urticaria is still ongoing but is better. The subject is 100% compliant with taking study medication and doing the diary. See the subject's study binder for further details of the visit. She will return in 6 weeks for study visit 11.  Signed by Hale Drone, MS, [place] Coordinator  [facility]  [place], [place] 1:25 PM 11/28/2020

## 2020-11-28 NOTE — Assessment & Plan Note (Addendum)
-  Continue [employer] research study per protocol for ILD

## 2020-11-28 NOTE — Progress Notes (Signed)
_0  ID: Elizabeth Espinoza, female    DOB: 22-Jan-1941, 80 y.o.   MRN: [id number]  No chief complaint on file.   Referring provider: Binnie Rail, MD  HPI:  Title: A randomized, double-blind, multicenter, dose-ranging, placebo-controlled Phase 2a evaluation of the safety, tolerability and pharmacokinetics of [id number] in participants with idiopathic pulmonary fibrosis (IPF)   Patients will be randomized in a 3:1 ratio to one of two treatment arms:  320 mg [id number] or placebo.   Protocol #: [id number], Clinical Trials EUDRACT# [id number]  ** Sponsor [employer] [email or web address]   ([place] [address])   Protocol Version Amendment 3 (ver.3.0) for 11/28/2020 Date of 19OCT2021  (double checked Yes) Consent Version 5.1 dated 11Jan2022, approved  for 11/28/2020 date of  (double checked Yes ) Investigator Brochure Version 5.0  Addendum for 11/28/2020 date of 21JAN2022   Key Inclusion Criteria:   Age >/= 79 years old Confident diagnosis of IPF  up to 5 years. If IPF diagnosis is within > 3 to = 5 years at screening, the participant must have evidence of progression within the last 24 months, as defined by decline in FVC percent predicted based on a relative decline of = 5% FVC% pred >/=45%  DLCO (hgb-adjusted) >/=30% Treatment with nintedanib or pirfenidone allowed, But must have stable dose =3 months before Screening Visit and remain unchanged during the study   Key Exclusion Criteria:   Receiving any non-approved agent intended for treatment of fibrosis in IPF FEV1 over the FVC ratio < 0.7 at screening Active infection,  that can affect FVC measurement during Screening or  Randomization Extent of emphysema > fibrotic changes on most recent HRCT IPF exacerbation within 6 months of Screening  Severe pulmonary hypertension Smoking of any kind w/in 3 months of Screening or unwilling to avoid smoking throughout the study Renal impairment or end-stage kidney  disease requiring dialysis History of unstable /deteriorating cardiac/pulmonary disease (other than IPF) w/in 6 months of Screening Any ongoing known malignancy, except for localized cancers (i.e. basal cell carcinoma) Medical or surgical condition known to affect drug absorption Surgeries scheduled during the study period    Side-Effects: [id number] was generally well tolerated in over 280 healthy participants with no clinical safety concerns or dose-limiting toxicities observed for single doses up to 681m and multiple doses up to 3270mQD administered for up to 14 days. The major side-effects reported have been headache (mild) and constipation (mild) in 3-76% of subjects. Other side effects, in 1-2% of subjects, were mild to moderate, with fever being the only severe side-effect reported.  Mechanism: Selective dual aV6 and aV1 integrin inhibitor with antifibrotic activity. [id number] aV6 and aV1 from binding to the RGD sequence of latency-associated peptide (LAP), this blocks the release of activated TGF-1, thereby blocking the activation of pro-fibrotic pathways. Additionally, [id number]-19379as been shown to reduce collagen synthesis and gene expression in human IPF tissue.   Interactions: Acts as a substrate of P-gp, OATP1B1, OATP1B3, OAT3 Inhibitors or inducers of these enzymes may affect the intestinal absorption, hepatic uptake or renal uptake of [id number] by CYP3A4, CYP2C9, CYP2C19 Potent inhibitors or inducers of these enzymes should be avoided Each CYP enzyme metabolizes < 10% of the parent compound Since multiple CYP enzymes are involved, clinically relevant drug-drug interactions are less likely, as there are other alternative pathways A potent inhibitor of multiple pathways should be used with caution as that has the potential to elicit a clinically relevant drug-drug interaction i.e. medication that inhibits  CYP3A4, CYP2C9, CYP2C19 [id number] is not an inducer  of CYP1A2, CYP2B6, or CYP3A4 Showed moderate inhibition of P-gp and MATE1, substrates of P-gp and MATE1 may  have increased exposure when coadministered with [id number] Metabolized by extrahepatic enzymes: CYP1A1, UGT1A7 Clinical relevance relating to drug interactions is unknown Stable when incubated with human recombinant MAO-A and MAO-B [id number] is not an inhibitor or inducer of CYP isoforms and may be co-administered with substrates of CYP isoforms without risk of DDI.  Interactions with specific drugs: Potential for increased exposure of nintedanib Recommend separating time of administration of [id number] and nintedanib Drug interactions with pirfenidone are not expected Use of corticosteroids should be discussed beforehand with the medical monitor   Clinical drug interactions studies have not been performed at this time and therefore guidance is based on the in-vitro studies  Disallowed Medications/Foods/Supplements: Fluconazole Digoxin Rifampin Grapefruit Grapefruit-containing foods and beverages St. John's Wort   Pharmacokinetics: Tmax : 2-3 hours Half-life: approximately 48 hours Steady state reached in 5-7 days Minimal renal clearance (8.5% of drug is cleared by the kidneys)    Administration [phone] should be administered on an empty stomach May be administered via feeding tube   Safety:   Edition Addendum to Edition 5.0 - 12 February 2020   Animal studies: No effect on neurobehavior No effect on respiratory parameters No effects on blood pressure Showed an increase in QT and QTc The high dose 400 mg/kg showed an increase in mean heart rate   Cardiovascular effects in mice after administration of [id number]: Dose-dependent increases in blood pressure Increase in PR and QRS duration Increase in arrhythmias  Reproductive animal toxicology studies at the low and medium doses, 150 and 300 mg/kg/day, revealed no impact on: maternal survival, fetal body weight,  food parameters, uterine and ovarian parameters, no external malformations  When the high dose 600 mg/kg/day was tested in rabbits, the rabbits displayed increased morbidities and increased abortions   Phase 1 studies:   Type of TEAE [id number] n=32 Placebo n=14 GI disorders 7 (22%) 0 Infections 3 (9%) 0 Musculoskeletal 1 (3%) 0 Nervous system disorders 2 (6%) 0   Most common treatment-emergent adverse event (TEAE):  headache  The most common drug-related adverse events were: nausea, diarrhea, abdominal distension. These occurred in 12.5% of participants  There were no deaths or serious adverse events in any of the Phase I studies There were no clinically meaningful changes in vital signs, telemetry or laboratory parameters [phone] did not display any clinically significant effect on ECG (iIncluding Qtc, HR, RR, PR and QRS)  Study [id number] All TEAEs were mild and deemed unrelated to study drug One TEAE, viral infection, led to discontinuation of IP No deaths nor SAEs  As of the 23 October 2019 cutoff date, preliminary data from ongoing and discontinued Phase 2a studies suggest no new safety concerns.  All SAE's reported to date were considered unrelated to study drug by the investigator. All treatment-emergent SAE's were known manifestations of the underlying  diseases (IPF, PSC or ARDS) and considered by the investigator to be most likely due to disease progresssion.  Part D of the study will evaluate approximately 28 participants at the 320 mg dose for at least 24 weeks to further characterize the safety profile of [id number]. Ongoing safety and FVC assessments will continue until the last participant reaches 24 weeks of the study; therefore, safety and FVC assessments may extend to 48 weeks in participants randomized earlier in the study.   11/28/2020 - Research visit  Patient returns  today for research visit #10. Subject expressed continued interest and  consent in continuing as study participant. She is doing well without acute complaints. Exam is documented.  Rash to her right elbow and lower back/buttock area has improved, she saw dermatology and was given a cream. It is barely visible on exam. She remains active. Weight is stable.    Allergies  Allergen Reactions   Caffeine     palpitations   Chlorpheniramine-Pseudoeph     palpitations   Maxifed     palpitations   Other Other (See Comments)   Pirfenidone Nausea Only    Esbriet Causes nausea and weakness    Immunization History  Administered Date(s) Administered   Influenza Split 11/07/2010   Influenza Whole 10/30/2009, 11/27/2011   Influenza, High Dose Seasonal PF 10/27/2012, 11/05/2013, 10/01/2017, 09/11/2018, 11/23/2019   Influenza-Unspecified 11/24/2014, 10/07/2015, 10/08/2016   PFIZER(Purple Top)SARS-COV-2 Vaccination 03/14/2019, 04/06/2019, 10/27/2019   Pneumococcal Conjugate-13 10/07/2015   Pneumococcal Polysaccharide-23 07/02/2017   Tdap 02/11/2012   Zoster Recombinat (Shingrix) 10/01/2017, 12/03/2017   Zoster, Live 01/09/2012    Past Medical History:  Diagnosis Date   Allergic rhinitis    GERD (gastroesophageal reflux disease)    Glaucoma    SUSPECT   Hiatal hernia    Hyperlipidemia    LDL goal = < 100   Hypothyroidism    ILD (interstitial lung disease) ([place])    Lichen planopilaris    MVP (mitral valve prolapse)    documented on 2 D ECHO   Restless legs    Urticaria     Tobacco History: Social History   Tobacco Use  Smoking Status Never  Smokeless Tobacco Never   Counseling given: Not Answered   Outpatient Medications Prior to Visit  Medication Sig Dispense Refill   aspirin EC 81 MG tablet Take 1 tablet (81 mg total) by mouth daily. 90 tablet 3   cetirizine (ZYRTEC) 10 MG tablet Take 10 mg by mouth daily.      cholecalciferol (VITAMIN D3) 25 MCG (1000 UT) tablet Take 1,000 Units by mouth daily.     clobetasol (TEMOVATE) 0.05 % external  solution SMARTSIG:1 Milliliter(s) Topical Twice Daily     esomeprazole (NEXIUM) 40 MG capsule Take 40 mg by mouth every morning.     famotidine (PEPCID) 40 MG tablet Take 1 tablet (40 mg total) by mouth daily. 90 tablet 1   fluticasone (FLONASE) 50 MCG/ACT nasal spray Place 1 spray into both nostrils daily as needed for allergies or rhinitis.     hydroxychloroquine (PLAQUENIL) 200 MG tablet Take 200 mg by mouth 2 (two) times daily.     magnesium oxide (MAG-OX) 400 MG tablet Take 400 mg by mouth daily.     Multiple Vitamins-Minerals (ZINC PO) Take 1 tablet by mouth.     pravastatin (PRAVACHOL) 40 MG tablet TAKE 1 TABLET BY MOUTH  DAILY 90 tablet 3   Probiotic Product (PROBIOTIC ADVANCED) CAPS Take by mouth.     rOPINIRole (REQUIP) 0.5 MG tablet TAKE 2 TABLETS BY MOUTH  AFTER DINNER EACH NIGHT. 180 tablet 3   SYNTHROID 100 MCG tablet TAKE 1 TABLET BY MOUTH  DAILY 90 tablet 3   vitamin B-12 (CYANOCOBALAMIN) 500 MCG tablet Take 500 mcg by mouth daily.     No facility-administered medications prior to visit.    Review of Systems  Review of Systems   Physical Exam  There were no vitals taken for this visit. Physical Exam   Lab Results:  CBC    Component  Value Date/Time   WBC 10.7 (H) 07/06/2020 1016   RBC 4.55 07/06/2020 1016   HGB 13.1 07/06/2020 1016   HGB 13.5 11/12/2018 1118   HCT 39.3 07/06/2020 1016   PLT 216.0 07/06/2020 1016   PLT 241 11/12/2018 1118   MCV 86.4 07/06/2020 1016   MCH 29.5 11/12/2018 1118   MCHC 33.3 07/06/2020 1016   RDW 15.1 07/06/2020 1016   LYMPHSABS 3.2 07/06/2020 1016   MONOABS 1.0 07/06/2020 1016   EOSABS 0.5 07/06/2020 1016   BASOSABS 0.1 07/06/2020 1016    BMET    Component Value Date/Time   NA 141 07/06/2020 1016   K 4.3 07/06/2020 1016   CL 105 07/06/2020 1016   CO2 26 07/06/2020 1016   GLUCOSE 90 07/06/2020 1016   BUN 10 07/06/2020 1016   CREATININE 0.76 07/06/2020 1016   CREATININE 0.78 11/12/2018 1118   CREATININE 0.80  11/07/2010 1027   CALCIUM 9.7 07/06/2020 1016   CALCIUM 9.3 11/12/2018 1118   GFRNONAA >60 11/12/2018 1118   GFRAA >60 11/12/2018 1118    BNP No results found for: BNP  ProBNP No results found for: PROBNP  Imaging: CT Chest High Resolution  Result Date: 11/16/2020 CLINICAL DATA:  80 year old female with history of idiopathic pulmonary fibrosis. [facility] protocol. Follow-up study. EXAM: CT CHEST WITHOUT CONTRAST TECHNIQUE: Multidetector CT imaging of the chest was performed following the standard protocol without intravenous contrast. High resolution imaging of the lungs, as well as inspiratory and expiratory imaging, was performed. COMPARISON:  Multiple priors, most recently 08/29/2020. FINDINGS: Cardiovascular: Heart size is normal. There is no significant pericardial fluid, thickening or pericardial calcification. There is aortic atherosclerosis, as well as atherosclerosis of the great vessels of the mediastinum and the coronary arteries, including calcified atherosclerotic plaque in the left anterior descending and right coronary arteries. Calcifications of the mitral annulus. Mediastinum/Nodes: No pathologically enlarged mediastinal or hilar lymph nodes. Please note that accurate exclusion of hilar adenopathy is limited on noncontrast CT scans. Esophagus is unremarkable in appearance. No axillary lymphadenopathy. Lungs/Pleura: High-resolution images again demonstrate very mild peripheral predominant ground-glass attenuation, scattered areas of septal thickening, widespread subpleural reticulation, mild cylindrical bronchiectasis and peripheral bronchiolectasis. No definitive honeycombing. No definite craniocaudal gradient. No significant progression compared to the prior examination. Inspiratory and expiratory imaging is unremarkable. No acute consolidative airspace disease. No pleural effusions. Upper Abdomen: Aortic atherosclerosis. Musculoskeletal: There are no aggressive appearing lytic or  blastic lesions noted in the visualized portions of the skeleton. IMPRESSION: 1. 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 2. Aortic atherosclerosis, in addition to 2 vessel coronary artery disease. Assessment for potential risk factor modification, dietary therapy or pharmacologic therapy may be warranted, if clinically indicated. 3. There are calcifications of the mitral annulus. Echocardiographic correlation for evaluation of potential valvular dysfunction may be warranted if clinically indicated. Aortic Atherosclerosis (ICD10-I70.0). Electronically Signed   By: Vinnie Langton M.D.   On: 11/16/2020 09:42     Assessment & Plan:   Research study patient - Continue [facility] research study per protocol for ILD    Martyn Ehrich, NP 11/28/2020

## 2021-01-05 ENCOUNTER — Encounter: Payer: Medicare Other | Admitting: Internal Medicine

## 2021-01-10 ENCOUNTER — Encounter: Payer: Medicare Other | Admitting: *Deleted

## 2021-01-10 ENCOUNTER — Other Ambulatory Visit: Payer: Self-pay

## 2021-01-10 ENCOUNTER — Encounter: Payer: Self-pay | Admitting: Adult Health

## 2021-01-10 ENCOUNTER — Encounter (INDEPENDENT_AMBULATORY_CARE_PROVIDER_SITE_OTHER): Payer: Medicare Other | Admitting: Adult Health

## 2021-01-10 DIAGNOSIS — J849 Interstitial pulmonary disease, unspecified: Secondary | ICD-10-CM

## 2021-01-10 DIAGNOSIS — Z006 Encounter for examination for normal comparison and control in clinical research program: Secondary | ICD-10-CM

## 2021-01-10 DIAGNOSIS — J84112 Idiopathic pulmonary fibrosis: Secondary | ICD-10-CM

## 2021-01-10 NOTE — Progress Notes (Signed)
_0  ID: Elizabeth Espinoza, female    DOB: 1940-06-04, 80 y.o.   MRN: [id number]  Chief Complaint  Patient presents with   Research    Referring provider: Binnie Rail, MD  HPI: Title: A randomized, double-blind, multicenter, dose-ranging, placebo-controlled Phase 2a evaluation of the safety, tolerability and pharmacokinetics of [phone] in participants with idiopathic pulmonary fibrosis (IPF)   Patients will be randomized in a 3:1 ratio to one of two treatment arms:  320 mg [id number] or placebo.   Protocol #: [id number], Clinical Trials EUDRACT# 2019-002709-23  ** Sponsor [employer] [email or web address]   ([place] [address])    Key Inclusion Criteria:   Age >/= 79 years old Confident diagnosis of IPF  up to 5 years. If IPF diagnosis is within > 3 to = 5 years at screening, the participant must have evidence of progression within the last 24 months, as defined by decline in FVC percent predicted based on a relative decline of = 5% FVC% pred >/=45%  DLCO (hgb-adjusted) >/=30% Treatment with nintedanib or pirfenidone allowed, But must have stable dose =3 months before Screening Visit and remain unchanged during the study   Key Exclusion Criteria:   Receiving any non-approved agent intended for treatment of fibrosis in IPF FEV1 over the FVC ratio < 0.7 at screening Active infection,  that can affect FVC measurement during Screening or  Randomization Extent of emphysema > fibrotic changes on most recent HRCT IPF exacerbation within 6 months of Screening  Severe pulmonary hypertension Smoking of any kind w/in 3 months of Screening or unwilling to avoid smoking throughout the study Renal impairment or end-stage kidney disease requiring dialysis History of unstable /deteriorating cardiac/pulmonary disease (other than IPF) w/in 6 months of Screening Any ongoing known malignancy, except for localized cancers (i.e. basal cell carcinoma) Medical or surgical  condition known to affect drug absorption Surgeries scheduled during the study period    Side-Effects: [id number] was generally well tolerated in over 280 healthy participants with no clinical safety concerns or dose-limiting toxicities observed for single doses up to 626m and multiple doses up to 3224mQD administered for up to 14 days. The major side-effects reported have been headache (mild) and constipation (mild) in 3-76% of subjects. Other side effects, in 1-2% of subjects, were mild to moderate, with fever being the only severe side-effect reported.  Mechanism: Selective dual aV6 and aV1 integrin inhibitor with antifibrotic activity. [id number]-50932revents aV6 and aV1 from binding to the RGD sequence of latency-associated peptide (LAP), this blocks the release of activated TGF-1, thereby blocking the activation of pro-fibrotic pathways. Additionally, PLIZT-24580as been shown to reduce collagen synthesis and gene expression in human IPF tissue.   Interactions: Acts as a substrate of P-gp, OATP1B1, OATP1B3, OAT3 Inhibitors or inducers of these enzymes may affect the intestinal absorption, hepatic uptake or renal uptake of [id number] by CYP3A4, CYP2C9, CYP2C19 Potent inhibitors or inducers of these enzymes should be avoided Each CYP enzyme metabolizes < 10% of the parent compound Since multiple CYP enzymes are involved, clinically relevant drug-drug interactions are less likely, as there are other alternative pathways A potent inhibitor of multiple pathways should be used with caution as that has the potential to elicit a clinically relevant drug-drug interaction i.e. medication that inhibits CYP3A4, CYP2C9, CYP2C19 [id number] not an inducer of CYP1A2, CYP2B6, or CYP3A4 Showed moderate inhibition of P-gp and MATE1, substrates of P-gp and MATE1 may  have increased exposure when coadministered with PLALP-37902etabolized by extrahepatic enzymes:  CYP1A1, UGT1A7 Clinical relevance  relating to drug interactions is unknown Stable when incubated with human recombinant MAO-A and MAO-B [id number] is not an inhibitor or inducer of CYP isoforms and may be co-administered with substrates of CYP isoforms without risk of DDI.  Interactions with specific drugs: Potential for increased exposure of nintedanib Recommend separating time of administration of [id number] and nintedanib Drug interactions with pirfenidone are not expected Use of corticosteroids should be discussed beforehand with the medical monitor   Clinical drug interactions studies have not been performed at this time and therefore guidance is based on the in-vitro studies  Disallowed Medications/Foods/Supplements: Fluconazole Digoxin Rifampin Grapefruit Grapefruit-containing foods and beverages [place] Wort   Pharmacokinetics: Tmax : 2-3 hours Half-life: approximately 48 hours Steady state reached in 5-7 days Minimal renal clearance (8.5% of drug is cleared by the kidneys)    Administration [phone] should be administered on an empty stomach May be administered via feeding tube   Safety:   Edition Addendum to Edition 5.0 - 12 February 2020   Animal studies: No effect on neurobehavior No effect on respiratory parameters No effects on blood pressure Showed an increase in QT and QTc The high dose 400 mg/kg showed an increase in mean heart rate   Cardiovascular effects in mice after administration of [id number]: Dose-dependent increases in blood pressure Increase in PR and QRS duration Increase in arrhythmias  Reproductive animal toxicology studies at the low and medium doses, 150 and 300 mg/kg/day, revealed no impact on: maternal survival, fetal body weight, food parameters, uterine and ovarian parameters, no external malformations  When the high dose 600 mg/kg/day was tested in rabbits, the rabbits displayed increased morbidities and increased abortions   Phase 1 studies:   Type of  TEAE [id number] n=32 Placebo n=14 GI disorders 7 (22%) 0 Infections 3 (9%) 0 Musculoskeletal 1 (3%) 0 Nervous system disorders 2 (6%) 0   Most common treatment-emergent adverse event (TEAE):  headache  The most common drug-related adverse events were: nausea, diarrhea, abdominal distension. These occurred in 12.5% of participants  There were no deaths or serious adverse events in any of the Phase I studies There were no clinically meaningful changes in vital signs, telemetry or laboratory parameters [phone] did not display any clinically significant effect on ECG (iIncluding Qtc, HR, RR, PR and QRS)  Study [id number] All TEAEs were mild and deemed unrelated to study drug One TEAE, viral infection, led to discontinuation of IP No deaths nor SAEs  As of the 23 October 2019 cutoff date, preliminary data from ongoing and discontinued Phase 2a studies suggest no new safety concerns.  All SAE's reported to date were considered unrelated to study drug by the investigator. All treatment-emergent SAE's were known manifestations of the underlying  diseases (IPF, PSC or ARDS) and considered by the investigator to be most likely due to disease progresssion.  Part D of the study will evaluate approximately 28 participants at the 320 mg dose for at least 24 weeks to further characterize the safety profile of [id number]. Ongoing safety and FVC assessments will continue until the last participant reaches 24 weeks of the study; therefore, safety and FVC assessments may extend to 48 weeks in participants randomized earlier in the study.   01/10/2021 Research Visit  This visit for Subject Elizabeth Espinoza with DOB: May 28, 1940 on 01/10/2021 for the above protocol is Visit/Encounter # 11   and is for purpose of research  . Subject/LAR expressed continued interest and consent in continuing as a study  subject. Subject thanked for participation in research and contribution to science. Subject  says she is doing well . Continues with mild rash on lower back. Ongoing AE.  Has seen dermatology . Remains active.      Allergies  Allergen Reactions   Caffeine     palpitations   Chlorpheniramine-Pseudoeph     palpitations   Maxifed     palpitations   Other Other (See Comments)   Pirfenidone Nausea Only    Esbriet Causes nausea and weakness    Immunization History  Administered Date(s) Administered   Influenza Split 11/07/2010   Influenza Whole 10/30/2009, 11/27/2011   Influenza, High Dose Seasonal PF 10/27/2012, 11/05/2013, 10/01/2017, 09/11/2018, 11/23/2019   Influenza-Unspecified 11/24/2014, 10/07/2015, 10/08/2016   PFIZER(Purple Top)SARS-COV-2 Vaccination 03/14/2019, 04/06/2019, 10/27/2019   Pneumococcal Conjugate-13 10/07/2015   Pneumococcal Polysaccharide-23 07/02/2017   Tdap 02/11/2012   Zoster Recombinat (Shingrix) 10/01/2017, 12/03/2017   Zoster, Live 01/09/2012    Past Medical History:  Diagnosis Date   Allergic rhinitis    GERD (gastroesophageal reflux disease)    Glaucoma    SUSPECT   Hiatal hernia    Hyperlipidemia    LDL goal = < 100   Hypothyroidism    ILD (interstitial lung disease) ([place])    Lichen planopilaris    MVP (mitral valve prolapse)    documented on 2 D ECHO   Restless legs    Urticaria     Tobacco History: Social History   Tobacco Use  Smoking Status Never  Smokeless Tobacco Never   Counseling given: Not Answered   Outpatient Medications Prior to Visit  Medication Sig Dispense Refill   aspirin EC 81 MG tablet Take 1 tablet (81 mg total) by mouth daily. 90 tablet 3   cetirizine (ZYRTEC) 10 MG tablet Take 10 mg by mouth daily.      cholecalciferol (VITAMIN D3) 25 MCG (1000 UT) tablet Take 1,000 Units by mouth daily.     clobetasol (TEMOVATE) 0.05 % external solution SMARTSIG:1 Milliliter(s) Topical Twice Daily     esomeprazole (NEXIUM) 40 MG capsule Take 40 mg by mouth every morning.     famotidine (PEPCID) 40 MG tablet  Take 1 tablet (40 mg total) by mouth daily. 90 tablet 1   fluticasone (FLONASE) 50 MCG/ACT nasal spray Place 1 spray into both nostrils daily as needed for allergies or rhinitis.     hydroxychloroquine (PLAQUENIL) 200 MG tablet Take 200 mg by mouth 2 (two) times daily.     magnesium oxide (MAG-OX) 400 MG tablet Take 400 mg by mouth daily.     Multiple Vitamins-Minerals (ZINC PO) Take 1 tablet by mouth.     pravastatin (PRAVACHOL) 40 MG tablet TAKE 1 TABLET BY MOUTH  DAILY 90 tablet 3   Probiotic Product (PROBIOTIC ADVANCED) CAPS Take by mouth.     rOPINIRole (REQUIP) 0.5 MG tablet TAKE 2 TABLETS BY MOUTH  AFTER DINNER EACH NIGHT. 180 tablet 3   SYNTHROID 100 MCG tablet TAKE 1 TABLET BY MOUTH  DAILY 90 tablet 3   vitamin B-12 (CYANOCOBALAMIN) 500 MCG tablet Take 500 mcg by mouth daily.     No facility-administered medications prior to visit.        Physical Exam See Research Physical     Lab Results:  CBC   BMET   BNP No results found for: BNP  ProBNP No results found for: PROBNP  Imaging: No results found.    PFT Results Latest Ref Rng & Units 03/04/2020 09/15/2019 03/16/2019 01/29/2018  FVC-Pre L 3.07 2.92 2.91 2.97  FVC-Predicted Pre % 104 99 97 98  FVC-Post L - - - 2.89  FVC-Predicted Post % - - - 95  Pre FEV1/FVC % % 78 80 79 81  Post FEV1/FCV % % - - - 83  FEV1-Pre L 2.39 2.34 2.29 2.40  FEV1-Predicted Pre % 108 106 102 105  FEV1-Post L - - - 2.39  DLCO uncorrected ml/min/mmHg 12.94 12.84 12.32 14.80  DLCO UNC% % 63 63 60 54  DLCO corrected ml/min/mmHg 12.94 13.26 12.91 -  DLCO COR %Predicted % 63 65 63 -  DLVA Predicted % 70 71 73 68    No results found for: NITRICOXIDE      Assessment & Plan:   Research study patient Continue study protocol  ILD (interstitial lung disease) ([place]) Appears stable continue with plan follow-up     Rexene Edison, NP 01/10/2021

## 2021-01-10 NOTE — Research (Signed)
Title: A randomized, double-blind, multicenter, dose-ranging, placebo-controlled Phase 2a evaluation of the safety, tolerability and pharmacokinetics of [id number] in participants with idiopathic pulmonary fibrosis (IPF)   Patients will be randomized in a 3:1 ratio to one of two treatment arms:  320 mg [id number] or placebo.   [facility] [place] Coordinator note :   This visit for Subject Elizabeth Espinoza with DOB: Apr 02, 1940 on 01/10/2021 for the above protocol is End of Treatment Visit and is for purpose of research.   The consent for this encounter is under Protocol Version Amendment 3.0 , Investigator Brochure Edition v.5.0 Addendum,  Consent Version 5.1 and is currently IRB approved.   Subject expressed continued interest and consent in continuing as a study subject. Subject confirmed that there was no change in contact information (e.g. address, telephone, email). Subject thanked for participation in research and contribution to science.  In this visit 01/10/2021 the subject will be evaluated by Sub-Investigator named Rexene Edison, NP. This research coordinator has verified that the above investigator is up to date with her training logs.   The Subject was informed that the PI Dr. Chase Caller continues to have oversight of the subject's visits and course  through relevant discussions, reviews and also specifically of this visit by routing of this note to the [facility].  All End of Treatment visit procedures and assessments were completed per the above mentioned protocol. No new AE's were reported. The status of ongoing AE's is captured in the subject's study binder. The subject is 100% compliant with taking study medication and with completing the diary. This is the final day of study medication dosing for this subject on this protocol. Refer to the subject's study binder for further details of the visit. She will return in 2 weeks for the End of Study visit, at which time her study  participation will be complete.  Signed by Hale Drone, MS, [place] / Nurse [place], [place] 10:41 AM 01/10/2021

## 2021-01-10 NOTE — Assessment & Plan Note (Signed)
Continue study protocol

## 2021-01-10 NOTE — Assessment & Plan Note (Signed)
Appears stable continue with plan follow-up

## 2021-01-25 DIAGNOSIS — M5417 Radiculopathy, lumbosacral region: Secondary | ICD-10-CM | POA: Diagnosis not present

## 2021-01-26 ENCOUNTER — Encounter: Payer: Medicare Other | Admitting: *Deleted

## 2021-01-26 ENCOUNTER — Encounter (INDEPENDENT_AMBULATORY_CARE_PROVIDER_SITE_OTHER): Payer: Medicare Other | Admitting: Internal Medicine

## 2021-01-26 ENCOUNTER — Other Ambulatory Visit: Payer: Self-pay

## 2021-01-26 ENCOUNTER — Other Ambulatory Visit: Payer: Self-pay | Admitting: Neurosurgery

## 2021-01-26 DIAGNOSIS — J84112 Idiopathic pulmonary fibrosis: Secondary | ICD-10-CM

## 2021-01-26 DIAGNOSIS — M5417 Radiculopathy, lumbosacral region: Secondary | ICD-10-CM

## 2021-01-26 DIAGNOSIS — Z006 Encounter for examination for normal comparison and control in clinical research program: Secondary | ICD-10-CM

## 2021-01-26 NOTE — Patient Instructions (Signed)
ICD-10-CM   1. Research study patient  Z00.6   2. IPF (idiopathic pulmonary fibrosis) (HCC)  J84.112     Per protocol 

## 2021-01-26 NOTE — Research (Signed)
Title: A randomized, double-blind, multicenter, dose-ranging, placebo-controlled Phase 2a evaluation of the safety, tolerability and pharmacokinetics of [id number] in participants with idiopathic pulmonary fibrosis (IPF)   Patients will be randomized in a 3:1 ratio to one of two treatment arms:  320 mg [id number] or placebo.   [facility] [place] Coordinator note :   This visit for Subject Elizabeth Espinoza with DOB: 03-09-1940 on 01/26/2021 for the above protocol is End of Study Visit and is for purpose of research.   The consent for this encounter is under Protocol Version Amendment 3.0 , Investigator Brochure Edition v.5.0 Addendum, Consent Version 5.1 and is currently IRB approved.   Subject expressed continued interest and consent in continuing as a study subject. Subject confirmed that there was no change in contact information (e.g. address, telephone, email). Subject thanked for participation in research and contribution to science.  In this visit 01/26/2021 the subject will be evaluated by Principal Investigator named Dr. Chase Caller. This research coordinator has verified that the above investigator is up to date with his training logs.   The Subject was informed that the PI Dr. Chase Caller continues to have oversight of the subject's visits and course  through relevant discussions, reviews and also specifically of this visit by routing of this note to the [facility].   This visit is a key visit of end of study. The PI is available for this visit.   All End of Study assessments and procedures were completed per the above mentioned protocol. No new AE's were reported. AE's of worsening urticaria and hearing loss were still ongoing. Refer to subject's study binder for further details of the visit. This was the final study visit for this protocol. The subject's study participation is now complete.   Signed by Hale Drone, MS, [place] Coordinator [place],  [place] 11:50 AM 01/26/2021

## 2021-01-26 NOTE — Progress Notes (Signed)
Title: A randomized, double-blind, multicenter, dose-ranging, placebo-controlled Phase 2a evaluation of the safety, tolerability and pharmacokinetics of [id number] in participants with idiopathic pulmonary fibrosis (IPF)   Patients will be randomized in a 3:1 ratio to one of two treatment arms:  320 mg [id number] or placebo.   Protocol #: [id number], Clinical Trials EUDRACT# [id number]  ** Sponsor [employer] [email or web address]   ([place] [address])   Xxxx This visit for Subject Elizabeth Espinoza with DOB: 1940-03-26 on 01/26/2021 for the above protocol is Visit/Encounter # END OF STIDUY  and is for purpose of research . Subject/LAR expressed continued interest and consent in continuing as a study subject. Subject thanked for participation in research and contribution to science.    Subjective: She is off the study drug.  Is the end of study visit.  She has some labs and test per protocol.  She endorses no new complaints.  She is very interested in research protocols.  She is not on any standard of care antifibrotic's and is not interested in them anymore.  Overall she feels good.  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  She is as a hearing aid for this and this is helped.  Some of the rash in her back is almost resolved although she can feel it I do not see it  Objective - I only hear right lower lobe crackles this time. -I do not see the rash in the back although she can feel it  Assessment  -Research subject -IPF  Plan  - End of study visit procedures - We will arrange for standard of care patient visit     SIGNATURE    Dr. Brand Males, M.D., F.C.C.P, ACRP-CPI Pulmonary and [place], [facility] [place] Staff Physician, [place] Director - Interstitial Lung Disease  Program  Pulmonary [facility] [place], [place], [address]   Pager: [phone], If no answer  OR between  19:00-7:00h: page [phone] Telephone (research): [phone]  12:31 PM 01/26/2021   12:31 PM 01/26/2021

## 2021-01-31 DIAGNOSIS — M4126 Other idiopathic scoliosis, lumbar region: Secondary | ICD-10-CM | POA: Diagnosis not present

## 2021-01-31 DIAGNOSIS — M5417 Radiculopathy, lumbosacral region: Secondary | ICD-10-CM | POA: Diagnosis not present

## 2021-01-31 DIAGNOSIS — M545 Low back pain, unspecified: Secondary | ICD-10-CM | POA: Diagnosis not present

## 2021-01-31 DIAGNOSIS — M48061 Spinal stenosis, lumbar region without neurogenic claudication: Secondary | ICD-10-CM | POA: Diagnosis not present

## 2021-02-02 DIAGNOSIS — Z961 Presence of intraocular lens: Secondary | ICD-10-CM | POA: Diagnosis not present

## 2021-02-02 DIAGNOSIS — H40013 Open angle with borderline findings, low risk, bilateral: Secondary | ICD-10-CM | POA: Diagnosis not present

## 2021-02-02 DIAGNOSIS — L438 Other lichen planus: Secondary | ICD-10-CM | POA: Diagnosis not present

## 2021-02-02 DIAGNOSIS — Z79899 Other long term (current) drug therapy: Secondary | ICD-10-CM | POA: Diagnosis not present

## 2021-02-02 DIAGNOSIS — H04123 Dry eye syndrome of bilateral lacrimal glands: Secondary | ICD-10-CM | POA: Diagnosis not present

## 2021-02-06 ENCOUNTER — Ambulatory Visit: Payer: Medicare Other | Admitting: Internal Medicine

## 2021-02-06 ENCOUNTER — Encounter: Payer: Self-pay | Admitting: Internal Medicine

## 2021-02-06 ENCOUNTER — Other Ambulatory Visit: Payer: Self-pay

## 2021-02-06 VITALS — BP 116/74 | HR 70 | Ht 66.0 in | Wt 142.6 lb

## 2021-02-06 DIAGNOSIS — E039 Hypothyroidism, unspecified: Secondary | ICD-10-CM | POA: Diagnosis not present

## 2021-02-06 NOTE — Progress Notes (Signed)
Patient ID: Elizabeth Espinoza, female   DOB: February 05, 1940, 81 y.o.   MRN: [id number]  This visit occurred during the SARS-CoV-2 public health emergency.  Safety protocols were in place, including screening questions prior to the visit, additional usage of staff PPE, and extensive cleaning of exam room while observing appropriate contact time as indicated for disinfecting solutions.   HPI  LAPRECIOUS Espinoza is a 81 y.o.-year-old female, referred by her PCP, Dr. Quay Burow, for management of hypothyroidism.  I saw the patient in the past, last visit being almost 4 years ago.  Was referred back by PCP due to previously uncontrolled TFTs.  Interim hx: Since last OV, she was dx'ed with IPF in 2021. She developed Barrett's esophagus after starting tx for this (Esbriet) - stopped. TSH was 10.19 at that time. 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  She has back pain. She had a steroid inj 3-4 years ago. She continues exercising -stationary bike, weights -but not in the last week due to back pain.Marland Kitchen  Pt. was dx with hypothyroidism in 1994 >> she was started on Synthroid d.a.w. initially but had to switch to generic Levothyroxine due to price.  At our last visit, she was taking LT4 88 mcg 6/7 days, dose decreased 08/2016.  She has been, the dose was decreased by PCP to 88 mcg daily, and then to 100 mcg daily, last dose change 08/2019.  She takes the thyroid hormone: - fasting, at 6 am - with water - separated by >2h from b'fast  - + calcium at night - no iron - stooped PPIs (Nexium) - prev. In am - + H2B (Pepcid)-later in the day - no multivitamins  - + Magnesium at night - no Hair skin and nails  I reviewed pt's thyroid tests: Lab Results  Component Value Date   TSH 0.44 07/06/2020   TSH 1.06 05/12/2020   TSH 0.88 01/07/2020   TSH 0.41 11/09/2019   TSH 5.16 (H) 09/03/2019   TSH 10.19 (H) 07/06/2019   TSH 1.48 09/30/2018   TSH 0.33 (L) 07/28/2018   TSH 0.43 07/04/2018   TSH 0.73  06/25/2017   FREET4 1.08 06/25/2017   FREET4 0.67 04/23/2017   FREET4 0.93 10/26/2016   FREET4 1.30 06/13/2015   FREET4 1.58 05/16/2015   FREET4 0.90 02/23/2015   FREET4 1.16 01/21/2007   T3FREE 2.9 06/13/2015   T3FREE 2.9 02/23/2015   Antithyroid antibodies were not elevated: Component     Latest Ref Rng & Units 10/26/2016  Thyroperoxidase Ab SerPl-aCnc     <9 IU/mL 1  Thyroglobulin Ab     < or = 1 IU/mL 1   Pt describes: - resolved hair loss - insomnia  She denies:  - weight gain - fatigue - cold intolerance - depression - constipation  Pt denies feeling nodules in neck, hoarseness, dysphagia/odynophagia, SOB with lying down.  She has + FH of thyroid disorders in: Paternal aunt. No FH of thyroid cancer.  No h/o radiation tx to head or neck. No recent use of iodine supplements.   No herbal supplements.   No recent contrast studies.   She is not on biotin.   No recent steroid use.  She has a history of GERD, prediabetes, MVP, hiatal hernia, restless leg syndrome. She sees Dr. Collene Mares with GI.  ROS: Constitutional: no weight gain/loss, no fatigue, no subjective hyperthermia/hypothermia Eyes: no blurry vision, no xerophthalmia ENT: no sore throat, no nodules palpated in throat, no dysphagia/odynophagia, no hoarseness Cardiovascular:  no CP/SOB/palpitations/leg swelling Respiratory: no cough/SOB Gastrointestinal: no N/V/D/C Musculoskeletal: no muscle/joint aches Skin: no rashes Neurological: no tremors/numbness/tingling/dizziness Psychiatric: no depression/anxiety  Past Medical History:  Diagnosis Date   Allergic rhinitis    GERD (gastroesophageal reflux disease)    Glaucoma    SUSPECT   Hiatal hernia    Hyperlipidemia    LDL goal = < 100   Hypothyroidism    ILD (interstitial lung disease) (HCC)    Lichen planopilaris    MVP (mitral valve prolapse)    documented on 2 D ECHO   Restless legs    Urticaria    Past Surgical History:  Procedure Laterality  Date   APPENDECTOMY     with [place]   negative   COLONOSCOPY  2005   Dr Olevia Perches   COLONOSCOPY  01/2013   Dr Collene Mares   esophageal dilation  07/04/2011   Dr Estrella Myrtle SIGMOIDOSCOPY  1999    Dr Olevia Perches   KNEE ARTHROSCOPY  2001, 2005   Dr Percell Miller   bilat   TOTAL ABDOMINAL HYSTERECTOMY     for Endometriosis; no BSO, Dr Colin Ina   UPPER GI ENDOSCOPY  01/2013   Dr Collene Mares   VIDEO BRONCHOSCOPY Bilateral 02/05/2019   Procedure: VIDEO BRONCHOSCOPY WITH FLUORO;  Surgeon: Brand Males, MD;  Location: [facility];  Service: Endoscopy;  Laterality: Bilateral;   Social History   Socioeconomic History   Marital status: Married    Spouse name: Jeneen Rinks   Number of children: 2   Years of education: Not on file   Highest education level: Not on file  Occupational History   Occupation: retired from [employer] retired  Tobacco Use   Smoking status: Never   Smokeless tobacco: Never  Vaping Use   Vaping Use: Never used  Substance and Sexual Activity   Alcohol use: Yes    Comment:  rarely   Drug use: No   Sexual activity: Yes  Other Topics Concern   Not on file  Social History Narrative   Not on file   Social Determinants of Health   Financial Resource Strain: Low Risk    Difficulty of Paying Living Expenses: Not hard at all  Food Insecurity: No Food Insecurity   Worried About Charity fundraiser in the Last Year: Never true   [place] in the Last Year: Never true  Transportation Needs: No Transportation Needs   Lack of Transportation (Medical): No   Lack of Transportation (Non-Medical): No  Physical Activity: Sufficiently Active   Days of Exercise per Week: 7 days   Minutes of Exercise per Session: 30 min  Stress: No Stress Concern Present   Feeling of Stress : Not at all  Social Connections: Moderately Isolated   Frequency of Communication with Friends and Family: More than three times a week   Frequency of Social Gatherings with Friends  and Family: More than three times a week   Attends Religious Services: Never   Marine scientist or Organizations: No   Attends Music therapist: Never   Marital Status: Married  Human resources officer Violence: Not At Risk   Fear of Current or Ex-Partner: No   Emotionally Abused: No   Physically Abused: No   Sexually Abused: No   Current Outpatient Medications on File Prior to Visit  Medication Sig Dispense Refill   aspirin EC 81 MG tablet Take 1 tablet (81 mg total) by mouth daily. 90 tablet  3   cetirizine (ZYRTEC) 10 MG tablet Take 10 mg by mouth daily.      cholecalciferol (VITAMIN D3) 25 MCG (1000 UT) tablet Take 1,000 Units by mouth daily.     clobetasol (TEMOVATE) 0.05 % external solution SMARTSIG:1 Milliliter(s) Topical Twice Daily     esomeprazole (NEXIUM) 40 MG capsule Take 40 mg by mouth every morning.     famotidine (PEPCID) 40 MG tablet Take 1 tablet (40 mg total) by mouth daily. 90 tablet 1   fluticasone (FLONASE) 50 MCG/ACT nasal spray Place 1 spray into both nostrils daily as needed for allergies or rhinitis.     hydroxychloroquine (PLAQUENIL) 200 MG tablet Take 200 mg by mouth 2 (two) times daily.     magnesium oxide (MAG-OX) 400 MG tablet Take 400 mg by mouth daily.     Multiple Vitamins-Minerals (ZINC PO) Take 1 tablet by mouth.     pravastatin (PRAVACHOL) 40 MG tablet TAKE 1 TABLET BY MOUTH  DAILY 90 tablet 3   Probiotic Product (PROBIOTIC ADVANCED) CAPS Take by mouth.     rOPINIRole (REQUIP) 0.5 MG tablet TAKE 2 TABLETS BY MOUTH  AFTER DINNER EACH NIGHT. 180 tablet 3   SYNTHROID 100 MCG tablet TAKE 1 TABLET BY MOUTH  DAILY 90 tablet 3   vitamin B-12 (CYANOCOBALAMIN) 500 MCG tablet Take 500 mcg by mouth daily.     ofloxacin (OCUFLOX) 0.3 % ophthalmic solution Place 1 drop into the left eye 4 (four) times daily.     [DISCONTINUED] loratadine (CLARITIN) 10 MG tablet Take 10 mg by mouth daily as needed.       [DISCONTINUED] omeprazole (PRILOSEC) 40 MG  capsule Take 1 capsule (40 mg total) by mouth daily. 90 capsule 0   No current facility-administered medications on file prior to visit.   Allergies  Allergen Reactions   Caffeine     palpitations   Chlorpheniramine-Pseudoeph     palpitations   Maxifed     palpitations   Other Other (See Comments)   Pirfenidone Nausea Only    Esbriet Causes nausea and weakness   Family History  Problem Relation Age of Onset   Lung cancer Father        Asbestos with mesothelioma   Heart disease Paternal Grandmother        ? etiology   Diabetes Paternal Grandmother    Diabetes Brother    Uterine cancer Paternal Aunt    Stroke Paternal Grandfather        > 35   Heart attack Brother 68   Allergies Sister    Aneurysm Mother        congenital   Stroke Brother        two strokes   Barrett's esophagus Brother     PE: BP 116/74 (BP Location: Left Arm, Patient Position: Sitting, Cuff Size: Normal)    Pulse 70    Ht _0  (1.676 m)    Wt 142 lb 9.6 oz (64.7 kg)    SpO2 97%    BMI 23.02 kg/m  Wt Readings from Last 3 Encounters:  02/06/21 142 lb 9.6 oz (64.7 kg)  10/20/20 141 lb (64 kg)  07/06/20 141 lb 6.4 oz (64.1 kg)   Constitutional: normal weight, in NAD Eyes: PERRLA, EOMI, no exophthalmos ENT: moist mucous membranes, no thyromegaly, no cervical lymphadenopathy Cardiovascular: RRR, No MRG Respiratory: CTA B Gastrointestinal: abdomen soft, NT, ND, BS+ Musculoskeletal: no deformities, strength intact in all 4 Skin: moist, warm, no rashes Neurological: no  tremor with outstretched hands, DTR normal in all 4  ASSESSMENT: 1. Hypothyroidism  PLAN:  1. Patient with long-standing hypothyroidism, on Synthroid therapy ($130 for 3 months). - she appears euthyroid.  - she does not appear to have a goiter, thyroid nodules, or neck compression symptoms - she had fairly well-controlled hypothyroidism in the past but after starting treatment for IPF, she developed high TSH levels.  This has  normalized after increasing the dose of her Synthroid by PCP. - latest thyroid labs reviewed with pt. >> normal: Lab Results  Component Value Date   TSH 0.44 07/06/2020  - she continues on d.a.w. Synthroid 100 mcg daily - pt feels good on this dose. - we discussed about taking the thyroid hormone every day, with water, >30 minutes before breakfast, separated by >4 hours from acid reflux medications, calcium, iron, multivitamins. Pt. is taking it correctly. - will check thyroid tests today: TSH and fT4 - If labs are abnormal, she will need to return for repeat TFTs in 1.5 months - We discussed that if we continue with Synthroid, she may get it from the Synthroid direct program through a specialty pharmacy.  Given brochure.  She will look into this.  Needs refills.   Component     Latest Ref Rng & Units 02/06/2021  TSH     0.35 - 5.50 uIU/mL 0.43  T4,Free(Direct)     0.60 - 1.60 ng/dL 1.14  TFTs are normal.  We can continue the same dose of Synthroid. I will check with her if she wants me to send it to Synthroid direct.  Philemon Kingdom, MD PhD [facility]

## 2021-02-06 NOTE — Patient Instructions (Signed)
Please continue Levothyroxine 100 mcg daily.  Take the thyroid hormone every day, with water, at least 30 minutes before breakfast, separated by at least 4 hours from: - acid reflux medications - calcium - iron - multivitamins  Please stop at the lab.  Please return in 1 year.

## 2021-02-07 ENCOUNTER — Encounter: Payer: Self-pay | Admitting: Internal Medicine

## 2021-02-07 DIAGNOSIS — E039 Hypothyroidism, unspecified: Secondary | ICD-10-CM

## 2021-02-07 LAB — TSH: TSH: 0.43 u[IU]/mL (ref 0.35–5.50)

## 2021-02-07 LAB — T4, FREE: Free T4: 1.14 ng/dL (ref 0.60–1.60)

## 2021-02-09 DIAGNOSIS — M5417 Radiculopathy, lumbosacral region: Secondary | ICD-10-CM | POA: Diagnosis not present

## 2021-02-09 DIAGNOSIS — M4310 Spondylolisthesis, site unspecified: Secondary | ICD-10-CM | POA: Diagnosis not present

## 2021-02-10 MED ORDER — LEVOTHYROXINE SODIUM 100 MCG PO TABS: 100.0000 ug | ORAL_TABLET | Freq: Every day | ORAL | 3 refills | Status: DC

## 2021-02-10 NOTE — Addendum Note (Signed)
Addended by: Lauralyn Primes on: 02/10/2021 02:11 PM   Modules accepted: Orders

## 2021-02-13 ENCOUNTER — Telehealth: Payer: Self-pay | Admitting: Internal Medicine

## 2021-02-13 DIAGNOSIS — J84112 Idiopathic pulmonary fibrosis: Secondary | ICD-10-CM

## 2021-02-13 NOTE — Telephone Encounter (Signed)
Elizabeth Espinoza  K 5.3 on research lab 01/27/21 and has been on the higher side in research. Now she is done with research study.  Plan  - please have her do [facility] BMET anytime this week (has been normal SOC  Thanks    SIGNATURE    Dr. Brand Males, M.D., F.C.C.P,  Pulmonary and Critical Care Medicine Staff Physician, [place] Director - Interstitial Lung Disease  Program  Pulmonary [place] at [place], [place], [address]  NPI Number:  NPI #[id number]  Pager: [phone], If no answer  -> Check AMION or Try [phone] Telephone (clinical office): [phone] Telephone (research): [phone]  4:18 PM 02/13/2021     Latest Reference Range & Units 01/21/07 16:06 01/22/07 03:00 09/01/08 00:00 11/02/09 13:41 11/07/10 10:27 06/20/11 12:47 02/11/12 14:39 06/09/13 15:22 06/11/14 10:28 02/23/15 09:14 06/13/15 15:20 06/27/16 10:01 07/02/17 12:27 07/04/18 11:48 11/12/18 11:18 07/06/19 10:33 07/06/20 10:16  Potassium 3.5 - 5.1 mEq/L 4.3 4.0 4.6 4.7 4.9 5.0 4.0 4.9 4.4 4.8 5.1 4.6 4.7 4.5 4.2 4.2 4.3

## 2021-02-14 ENCOUNTER — Other Ambulatory Visit (INDEPENDENT_AMBULATORY_CARE_PROVIDER_SITE_OTHER): Payer: Medicare Other

## 2021-02-14 DIAGNOSIS — J84112 Idiopathic pulmonary fibrosis: Secondary | ICD-10-CM

## 2021-02-14 LAB — BASIC METABOLIC PANEL
BUN: 13 mg/dL (ref 6–23)
CO2: 28 mEq/L (ref 19–32)
Calcium: 9.3 mg/dL (ref 8.4–10.5)
Chloride: 104 mEq/L (ref 96–112)
Creatinine, Ser: 0.69 mg/dL (ref 0.40–1.20)
GFR: 82.32 mL/min (ref >60.00)
Glucose, Bld: 89 mg/dL (ref 70–99)
Potassium: 4.2 mEq/L (ref 3.5–5.1)
Sodium: 139 mEq/L (ref 135–145)

## 2021-02-14 NOTE — Telephone Encounter (Signed)
Called and spoke with pt letting her know info per MR and that he wanted her to have labs rechecked. Pt verbalized understanding. Order has been placed. Nothing further needed.

## 2021-02-15 NOTE — Progress Notes (Signed)
Standard of care potassium is normal. Proably the research sample was hemolyzed  Lab             02/14/21                      1018         NA           139          K            4.2          CL           104          CO2          28           GLUCOSE      89           BUN          13           CREATININE   0.69         CALCIUM      9.3

## 2021-02-17 DIAGNOSIS — R531 Weakness: Secondary | ICD-10-CM | POA: Diagnosis not present

## 2021-02-17 DIAGNOSIS — M79605 Pain in left leg: Secondary | ICD-10-CM | POA: Diagnosis not present

## 2021-02-17 DIAGNOSIS — M2560 Stiffness of unspecified joint, not elsewhere classified: Secondary | ICD-10-CM | POA: Diagnosis not present

## 2021-02-17 DIAGNOSIS — M545 Low back pain, unspecified: Secondary | ICD-10-CM | POA: Diagnosis not present

## 2021-02-20 DIAGNOSIS — M545 Low back pain, unspecified: Secondary | ICD-10-CM | POA: Diagnosis not present

## 2021-02-20 DIAGNOSIS — M2560 Stiffness of unspecified joint, not elsewhere classified: Secondary | ICD-10-CM | POA: Diagnosis not present

## 2021-02-20 DIAGNOSIS — R531 Weakness: Secondary | ICD-10-CM | POA: Diagnosis not present

## 2021-02-20 DIAGNOSIS — M79605 Pain in left leg: Secondary | ICD-10-CM | POA: Diagnosis not present

## 2021-02-22 DIAGNOSIS — M545 Low back pain, unspecified: Secondary | ICD-10-CM | POA: Diagnosis not present

## 2021-02-22 DIAGNOSIS — M79605 Pain in left leg: Secondary | ICD-10-CM | POA: Diagnosis not present

## 2021-02-22 DIAGNOSIS — R531 Weakness: Secondary | ICD-10-CM | POA: Diagnosis not present

## 2021-02-22 DIAGNOSIS — M2560 Stiffness of unspecified joint, not elsewhere classified: Secondary | ICD-10-CM | POA: Diagnosis not present

## 2021-02-28 DIAGNOSIS — M545 Low back pain, unspecified: Secondary | ICD-10-CM | POA: Diagnosis not present

## 2021-02-28 DIAGNOSIS — M2560 Stiffness of unspecified joint, not elsewhere classified: Secondary | ICD-10-CM | POA: Diagnosis not present

## 2021-02-28 DIAGNOSIS — R531 Weakness: Secondary | ICD-10-CM | POA: Diagnosis not present

## 2021-02-28 DIAGNOSIS — M79605 Pain in left leg: Secondary | ICD-10-CM | POA: Diagnosis not present

## 2021-03-02 DIAGNOSIS — M2560 Stiffness of unspecified joint, not elsewhere classified: Secondary | ICD-10-CM | POA: Diagnosis not present

## 2021-03-02 DIAGNOSIS — M545 Low back pain, unspecified: Secondary | ICD-10-CM | POA: Diagnosis not present

## 2021-03-02 DIAGNOSIS — R531 Weakness: Secondary | ICD-10-CM | POA: Diagnosis not present

## 2021-03-02 DIAGNOSIS — M79605 Pain in left leg: Secondary | ICD-10-CM | POA: Diagnosis not present

## 2021-03-06 ENCOUNTER — Encounter: Payer: Medicare Other | Admitting: Adult Health

## 2021-03-06 DIAGNOSIS — M2560 Stiffness of unspecified joint, not elsewhere classified: Secondary | ICD-10-CM | POA: Diagnosis not present

## 2021-03-06 DIAGNOSIS — M79605 Pain in left leg: Secondary | ICD-10-CM | POA: Diagnosis not present

## 2021-03-06 DIAGNOSIS — M545 Low back pain, unspecified: Secondary | ICD-10-CM | POA: Diagnosis not present

## 2021-03-06 DIAGNOSIS — R531 Weakness: Secondary | ICD-10-CM | POA: Diagnosis not present

## 2021-03-08 ENCOUNTER — Other Ambulatory Visit: Payer: Self-pay

## 2021-03-08 ENCOUNTER — Encounter: Payer: Medicare Other | Admitting: *Deleted

## 2021-03-08 DIAGNOSIS — H16143 Punctate keratitis, bilateral: Secondary | ICD-10-CM | POA: Diagnosis not present

## 2021-03-08 DIAGNOSIS — J84112 Idiopathic pulmonary fibrosis: Secondary | ICD-10-CM

## 2021-03-08 DIAGNOSIS — H04123 Dry eye syndrome of bilateral lacrimal glands: Secondary | ICD-10-CM | POA: Diagnosis not present

## 2021-03-08 DIAGNOSIS — Z006 Encounter for examination for normal comparison and control in clinical research program: Secondary | ICD-10-CM

## 2021-03-08 NOTE — Research (Signed)
Title:A Randomized, Double-blind, Placebo-controlled, Phase 3 Study of the Efficacy and Safety of Inhaled Treprostinil in Subjects with Idiopathic Pulmonary Fibrosis   Dose and Duration of Treatment:Treprostinil for Inhalation 0.6 mg/mL, or placebo (randomly assigned 1:1) over a 52 week period.  Protocol # RIN-PF-301; IND # [id number]; Clinical Trials.gov Identifier: [id number]  Sponsor: [place], [place] [address]  Protocol Version/Amendment: Amendment 3, date 25Apr2022 for 03/08/2021 date of double checked  yes Below information reflects this PA -  YES  Consent Version: ver.Amendment 3.0  for 03/08/2021 date of  double checked  yes  Psychologist, forensic Sponsor:  [employer] Version: 17 for _0  date_1   below information reflects this [employer] analyst:  Treprostinil for Inhalation, 0.6 mg/mL  Key Features of the Drug: Treprostinil (LRX-15, 15AU81, UT-15) is a stable tricyclic benzindene analogue of the naturally occurring prostacyclin (PGI2; epoprostenol), a member of the eicosanoid family of autocoids; these compounds occur widely in tissues and have important pharmacological properties with potent activity, especially on the cardiovascular system and smooth muscle ([place]). An inhalation solution of treprostinil, 0.6 mg/mL, delivered using an ultrasonic nebulizer, is currently FDA approved to treat pulmonary arterial hypertension (PAH); [WHO Group 1, PH-ILD; WHO Group 3. Inhaled treprostinil is NOW being evaluated as primary treatment of IPF, idiopathic pulmonary fibrosis.   Key Inclusion Criteria: ?80 years of age Diagnosis of IPF based on the 2018 ATS/ERS/JRS/ALAT Clinical Practice Guideline (FVC ?45% predicted at Screening).  Subjects on pirfenidone or nintedanib must be on a stable and optimized dose for ?30 days prior to Baseline. Concomitant use of both pirfenidone and nintedanib  is not permitted. Males with a partner of childbearing potential must use a condom for the duration of treatment and for at least 48 hours after discontinuing study drug.   Key Exclusion Criteria: Subject is pregnant or lactating.  FEV1/FVC <0.70 at Screening.  Prior intolerance or significant lack of efficacy to a prostacyclin or prostacyclin analogue that resulted in discontinuation or inability to effectively titrate that therapy.  The subject has received any PAH-approved therapy, including prostacyclin therapy (epoprostenol, treprostinil, iloprost, or beraprost; except for acute vasoreactivity testing), IP receptor agonists (selexipag), endothelin receptor antagonists, phosphodiesterase type 5 inhibitors (PDE5-Is), or soluble guanylate cyclase stimulators within 60 days prior to Baseline. As needed use of a PDE5-I for erectile dysfunction is permitted, provided no doses are taken within 48 hours of any study-related efficacy assessments.  Use of any of the following medications:  `     a. Azathioprine (AZA), cyclosporine, mycophenolate mofetil, tacroliumus, oral           corticosteroids (OCS) >20 mg/day or the combination of            OCS+AZA+N-acetylcysteine within 30 days prior to Baseline.        b. Cyclophosphamide within 60 days prior to Baseline        c. Rituximab within 6 months prior to Baseline   Receiving >10 L/min of oxygen at rest at Baseline.   Exacerbation of IPF or active pulmonary or upper respiratory infection within 30 days prior to Baseline. Subjects must have completed any antibiotic or steroid regimens for treatment of the infection or acute exacerbation more than 30 days prior to Baseline to be eligible. If hospitalized for an acute exacerbation of IPF or a pulmonary or upper respiratory infection, subjects must have been discharged more than 90 days prior to Baseline to be eligible.  Uncontrolled cardiac disease, defined  as myocardial infarction within 6 months prior to  Baseline or unstable angina within 30 days prior to Baseline. Use of any investigational drug/device or participation in any investigational study in which the subject received a medical intervention (ie, procedure, device, medication/supplement) within 30 days prior to Screening. Subjects participating in non-interventional, observational, or registry studies are eligible.  Life expectancy <6 months due to IPF or a concomitant illness.  Acute pulmonary embolism within 90 days prior to Baseline.   Pharmacodynamics:  (Dose-Response Relationships) - In a clinical study of 241 healthy volunteers, single doses of Tyvaso 54 ?g (the target maintenance dose per session) and 84 ?g (supratherapeutic inhalation dose) prolonged the QTc interval by approximately 10 ms. The QTc effect dissipated rapidly as the concentration of treprostinil decreased.  Pharmacokinetics: (ADME) - Treprostinil is substantially metabolized by the liver, primarily by CYP2C8.  The elimination of treprostinil (following [place] administration of treprostinil) is biphasic, with a terminal elimination t1?2 of approximately 4 hours.    Contraindications:   None  Special Warnings/Considerations: Treprostinil undergoes substantial hepatic metabolism; thus, liver impairment could result in decreased metabolism and increased systemic exposure to treprostinil potentiating the occurrence and/or severity of AEs and/or overdose. There are no adequate and well-controlled studies with Tyvaso in pregnant women. There are risks to the mother and the fetus associated with PAH. Patients who discontinue the use of any effective therapy may be expected to notice a decline in their clinical status. Patients who abruptly discontinue therapy (ie, miss 2 or more doses), or markedly reduce the dose, are at risk for worsening of PAH symptoms or a rebound in [facility]. There is no evidence to suggest or suspect that patients taking inhaled treprostinil would be at risk for  withdrawal symptoms or rebound effects.  Treprostinil inhibits platelet aggregation and increases the risk of bleeding, particularly with concomitant anticoagulants and antiplatelet therapies. Treprostinil is a pulmonary and systemic vasodilator. In patients with low systemic arterial pressure, inhaled treprostinil solution may cause symptomatic hypotension, which may present as but is not limited to light-headedness, dizziness, blurred or faded vision, and syncope. Concomitant administration of treprostinil with diuretics, antihypertensive agents, or other vasodilators that may lower blood pressure increases the risk of symptomatic hypotension. Signs and symptoms of overdose with inhaled treprostinil solution may correspond to dose-limiting pharmacologic effects, including diarrhea, flushing, headache, hypotension, nausea, and vomiting. Most events are self-limiting and resolve with reduction or withholding of inhaled treprostinil solution.  Interactions:              Drug Interaction Studies Conducted with Treprostinil: Study # P01:08: Treprostinil formulation [place] Remodulin vs Acetaminophen, to evaluate effect of acetaminophen on Treprostinil pK, showed no effect. Study # P01:12: Treprostinil formulation [place] Remodulin vs Warfarin, to evaluate effect of Warfarin on Treprostinil pK/PD, showed no effect. Study TDE-PH-105: Treprostinil formulation Orenitram vs Bosentan, to evaluate effect of Bosentan on Treprostinil pK, showed no effect. Study TDE-PH-106: Treprostinil formulation Orenitram vs Sildenafil, to evaluate effect of Sildenafil on Treprostinil pK, showed no effect. Study TDE-PH-109: Treprostinil formulation Orenitram vs Rifampicin (CYP2C8/9 inducer), to evaluate the effect of inducing the VEL3Y1 metabolic pathway on the pK of Treprostinil, confirmed metabolic pathway via OFB5Z0/2. Expected interaction resulting in reduced Cmax and AUC. Study TDE-PH-110: Treprostinil formulation Orenitram vs  Gemfibrozil (CYP2C8  inhibitor), to evaluate the effect of inhibiting the HEN2D7 metabolic pathway on the pK of Treprostinil, confirmed metabolic pathway primarily via CYP2C8. Expected interaction resulting in significantly increased Cmax and AUC. Study TDE-PH-110: Treprostinil formulation Orenitram vs Fluconazole (CYP2C9  inhibitor), to evaluate the effect of inhibiting the UYQ0H4 metabolic pathway on on the pK of Treprostinil, had no notable effect. Confirmed CYP2C9 plays a minor role in treprostinil metabolism.  Serious Adverse Reactions Observed in Clinical Studies with Inhaled Treprostinil: Pulmonary Arterial Hypertension: all occurrences from RIN-PH-301 study for Pulmonary Arterial Hypertension. No serious adverse reactions met criteria for inclusion in inhaled treprostinil solution clinical studies for interstitial lung disease. System/Organ Class: Nervous System disorders - Syncope in 3 of 115 subjects exposed (2.6%)      Serious Adverse Reactions Observed Postmarketing: Gastrointestinal disorders: vomiting, nausea, diarrhea, GI Hemorrhage (reflects an aggregate of the following: GI hemorrhage, rectal hemorrhage, blood in stool [hematochezia or melena], and hematemesis). General disorders and administration site conditions: Pain Infections and infestations: Pneumonia Nervous system disorders: Syncope, dizziness, headache Respiratory, thoracic and mediastinal disorders: Hemoptysis, cough, epistaxis Vascular disorders: Hypotension, hemorrhage (unspecified blood loss ranging from nondescript to heavy or excessive bleeding).  Safety Data:  As of 08 August 2020, approximately 955 subjects have received treprostinil by inhalation across various studies, ranging from acute studies in healthy volunteers to long-term treatment in subjects with PAH and PH-ILD.   The estimate of worldwide exposure to treprostinil in the marketed inhaled formulations is approximately 74,259,563 total patient treatment-days  (approximately 34,318 patient treatment-years). The most common adverse effects of inhaled treprostinil ?10% were cough, diarrhea, dizziness, flushing, headache, muscle/jaw/bone pain, nausea, pharyngolaryngeal pain, oropharyngeal pain, syncope, and throat irritation. Other adverse reactions in the observational study comparing subjects taking inhaled treprostinil and a control group were cough, hemoptysis, nasal discomfort, and throat irritation. Angioedema is a rare but serious reaction that has been seen in the post-marketing setting with treprostinil.  Stability:  Ampoules of drug product are stable until the date indicated when stored in the unopened foil pouch. The [place] commercial labeling states the following regarding storage conditions: Store at [place] to [place] (56F to 67F), with excursions permitted to 87F to 64P (42F to 89F).  Once the foil pack is opened, ampoules should be used within 7 days. Because Tyvaso is light-sensitive, unopened ampoules should be stored in the foil pouch.   [facility] Coordinator note :   This visit for Subject Elizabeth Espinoza with DOB: 04-25-1940 on 03/08/2021 for the above protocol is Screening Visit and is for purpose of research.   The consent for this encounter is under Protocol Version Amendment 3.0 , Investigator Brochure Edition v.17, Consent Version 2.0 and is currently IRB approved.   Subject expressed continued interest and consent in continuing as a study subject. Subject confirmed that there was no change in contact information (e.g. address, telephone, email). Subject thanked for participation in research and contribution to science.  In this visit 03/08/2021 the subject will be evaluated by Sub-Investigator named Unice Cobble, MD  . This research coordinator has verified that the above investigator is up to date with his training logs.   The Subject was informed that the PI Dr. Chase Caller continues to have oversight of the  subject's visits and course  through relevant discussions, reviews and also specifically of this visit by routing of this note to the [place].  This visit is a key visit of  screening. The PI is not available for this visit.  Because the PI is NOT available, the sub-I reported and CRC has confirmed that the PI Dr. Chase Caller has discussed the visit a-priori with the sub-investigator.  The subject was allowed ample time in a quiet room to review the  informed consent form, and this coordinator reviewed the consent with the subject. All questions were answered. The subject then signed the informed consent. No study related procedures were performed prior to the consent being signed. Dr. Linna Darner was present for the consent process and he signed the consent also. See the Informed Consent Process Documentation Checklist for details of the consent process. After the consent was signed, all screening assessments and procedures were completed per the above mentioned protocol. If all criteria are met, the subject will return within the next 45 days for the baseline visit to be randomized into the study and receive the first dose of study medication.  Signed by Hale Drone, MS, [place], [place] 1:59 PM 03/08/2021

## 2021-03-09 DIAGNOSIS — M2560 Stiffness of unspecified joint, not elsewhere classified: Secondary | ICD-10-CM | POA: Diagnosis not present

## 2021-03-09 DIAGNOSIS — M79605 Pain in left leg: Secondary | ICD-10-CM | POA: Diagnosis not present

## 2021-03-09 DIAGNOSIS — R531 Weakness: Secondary | ICD-10-CM | POA: Diagnosis not present

## 2021-03-09 DIAGNOSIS — M545 Low back pain, unspecified: Secondary | ICD-10-CM | POA: Diagnosis not present

## 2021-03-09 NOTE — Progress Notes (Signed)
Elizabeth Espinoza, DOB September 15, 1940,was seen as subject in a clinical trial /Protocol # RIN-PF-301;IND # [id number] on 08 Mar 2021. Cardiopulmonary symptoms are stable as exertional dyspnea. Other or new symptoms denied. Pertinent physical findings include: Split S1 @ LSB; slight clubbing of nailbeds;faint rales bilaterally, R> L. All physical findings NCS                                                                     Hendricks Limes MD,SI

## 2021-03-13 DIAGNOSIS — R531 Weakness: Secondary | ICD-10-CM | POA: Diagnosis not present

## 2021-03-13 DIAGNOSIS — M545 Low back pain, unspecified: Secondary | ICD-10-CM | POA: Diagnosis not present

## 2021-03-13 DIAGNOSIS — M79605 Pain in left leg: Secondary | ICD-10-CM | POA: Diagnosis not present

## 2021-03-13 DIAGNOSIS — M2560 Stiffness of unspecified joint, not elsewhere classified: Secondary | ICD-10-CM | POA: Diagnosis not present

## 2021-03-14 DIAGNOSIS — M5417 Radiculopathy, lumbosacral region: Secondary | ICD-10-CM | POA: Diagnosis not present

## 2021-03-20 ENCOUNTER — Encounter: Payer: Self-pay | Admitting: Internal Medicine

## 2021-03-20 NOTE — Progress Notes (Signed)
Subjective:    Patient ID: Elizabeth Espinoza, female    DOB: 05-Aug-1940, 81 y.o.   MRN: [id number]  This visit occurred during the SARS-CoV-2 public health emergency.  Safety protocols were in place, including screening questions prior to the visit, additional usage of staff PPE, and extensive cleaning of exam room while observing appropriate contact time as indicated for disinfecting solutions.    HPI The patient is here for an acute visit.   Jitters -   last Friday night she did not sleep but an hour.  It felt like everything in her body was jerking.  She could not hold her hands out straight because they were trembling.    She started taking doxycycline 100 mg bid x 10 days and moxifloxacin eye drops - about 5 days into it she felt a little shaky.  She is not sure if it is from the medication or not.  She was shaky all over - she felt like she had to move her legs.  She stoppped the medication Saturday - three days ago.  The shakiness is better than it was Saturday.   Rls got worse too.  She did sleep okay last night for the first night and a few nights, but today she feels shaky.  She has cramping all the time.  Legs and fingers.  That is not new.  She did switch her antihistamine to Allegra, but has taken that in the past without issues.  She denies any other new medications, increased caffeine intake, over-the-counter medications or supplements.  She is still taking the Requip nightly.  Medications and allergies reviewed with patient and updated if appropriate.  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   Bilateral shoulder pain 11/18/2018   Bony sclerosis 10/21/2018   Chronic cough 01/29/2018   Acute sinusitis 01/02/2018   Rash and nonspecific skin eruption 07/30/2016   Prediabetes 02/24/2015   Dyspnea on exertion 09/29/2014   ILD  (interstitial lung disease) ([place]) 12/11/2012   Barrett's esophagus 12/11/2012   RLS (restless legs syndrome) 04/13/2011   ARTHRALGIA 09/01/2008   HIATAL HERNIA 07/31/2007   History of cardiovascular disorder 07/31/2007   Allergic rhinitis 06/19/2007   GASTROESOPHAGEAL REFLUX DISEASE, CHRONIC 06/19/2007   Hypothyroidism 02/07/2007   HYPERLIPIDEMIA 02/07/2007    Current Outpatient Medications on File Prior to Visit  Medication Sig Dispense Refill   alclomethasone (ACLOVATE) 0.05 % ointment Apply topically.     aspirin EC 81 MG tablet Take 1 tablet (81 mg total) by mouth daily. 90 tablet 3   betamethasone, augmented, (DIPROLENE) 0.05 % lotion Apply topically.     cetirizine (ZYRTEC) 10 MG tablet Take 10 mg by mouth daily.      cholecalciferol (VITAMIN D3) 25 MCG (1000 UT) tablet Take 1,000 Units by mouth daily.     clobetasol (TEMOVATE) 0.05 % external solution SMARTSIG:1 Milliliter(s) Topical Twice Daily     doxycycline (VIBRA-TABS) 100 MG tablet Take 100 mg by mouth 2 (two) times daily.     esomeprazole (NEXIUM) 40 MG capsule Take 40 mg by mouth every morning.     famotidine (PEPCID) 40 MG tablet Take 1 tablet (40 mg total) by mouth daily. 90 tablet 1   fluticasone (FLONASE) 50 MCG/ACT nasal spray Place 1 spray into both nostrils daily as needed for allergies or rhinitis.     hydroxychloroquine (PLAQUENIL) 200  MG tablet Take 200 mg by mouth 2 (two) times daily.     levothyroxine (SYNTHROID) 100 MCG tablet Take 1 tablet (100 mcg total) by mouth daily. 90 tablet 3   magnesium oxide (MAG-OX) 400 MG tablet Take 400 mg by mouth daily.     moxifloxacin (VIGAMOX) 0.5 % ophthalmic solution Place 1 drop into the left eye 4 (four) times daily.     Multiple Vitamins-Minerals (ZINC PO) Take 1 tablet by mouth.     ofloxacin (OCUFLOX) 0.3 % ophthalmic solution Place 1 drop into the left eye 4 (four) times daily.     pravastatin (PRAVACHOL) 40 MG tablet TAKE 1 TABLET BY MOUTH  DAILY 90 tablet 3    Probiotic Product (PROBIOTIC ADVANCED) CAPS Take by mouth.     rOPINIRole (REQUIP) 0.5 MG tablet TAKE 2 TABLETS BY MOUTH  AFTER DINNER EACH NIGHT. 180 tablet 3   triamcinolone cream (KENALOG) 0.1 % Apply 1 application topically 2 (two) times daily.     vitamin B-12 (CYANOCOBALAMIN) 500 MCG tablet Take 500 mcg by mouth daily.     [DISCONTINUED] loratadine (CLARITIN) 10 MG tablet Take 10 mg by mouth daily as needed.       [DISCONTINUED] omeprazole (PRILOSEC) 40 MG capsule Take 1 capsule (40 mg total) by mouth daily. 90 capsule 0   No current facility-administered medications on file prior to visit.    Past Medical History:  Diagnosis Date   Allergic rhinitis    GERD (gastroesophageal reflux disease)    Glaucoma    SUSPECT   Hiatal hernia    Hyperlipidemia    LDL goal = < 100   Hypothyroidism    ILD (interstitial lung disease) (HCC)    Lichen planopilaris    MVP (mitral valve prolapse)    documented on 2 D ECHO   Restless legs    Urticaria     Past Surgical History:  Procedure Laterality Date   APPENDECTOMY     with Foot of Ten   negative   COLONOSCOPY  2005   Dr Olevia Perches   COLONOSCOPY  01/2013   Dr Collene Mares   esophageal dilation  07/04/2011   Dr Estrella Myrtle SIGMOIDOSCOPY  1999    Dr Olevia Perches   KNEE ARTHROSCOPY  2001, 2005   Dr Percell Miller   bilat   TOTAL ABDOMINAL HYSTERECTOMY     for Endometriosis; no BSO, Dr Colin Ina   UPPER GI ENDOSCOPY  01/2013   Dr Collene Mares   VIDEO BRONCHOSCOPY Bilateral 02/05/2019   Procedure: VIDEO BRONCHOSCOPY WITH FLUORO;  Surgeon: Brand Males, MD;  Location: [place];  Service: Endoscopy;  Laterality: Bilateral;    Social History   Socioeconomic History   Marital status: Married    Spouse name: Jeneen Rinks   Number of children: 2   Years of education: Not on file   Highest education level: Not on file  Occupational History   Occupation: retired from [employer] retired  Tobacco Use   Smoking status: Never    Smokeless tobacco: Never  Vaping Use   Vaping Use: Never used  Substance and Sexual Activity   Alcohol use: Yes    Comment:  rarely   Drug use: No   Sexual activity: Yes  Other Topics Concern   Not on file  Social History Narrative   Not on file   Social Determinants of Health   Financial Resource Strain: Low Risk    Difficulty of Paying Living Expenses: Not hard at  all  Food Insecurity: No Food Insecurity   Worried About Charity fundraiser in the Last Year: Never true   Ran Out of Food in the Last Year: Never true  Transportation Needs: No Transportation Needs   Lack of Transportation (Medical): No   Lack of Transportation (Non-Medical): No  Physical Activity: Sufficiently Active   Days of Exercise per Week: 7 days   Minutes of Exercise per Session: 30 min  Stress: No Stress Concern Present   Feeling of Stress : Not at all  Social Connections: Moderately Isolated   Frequency of Communication with Friends and Family: More than three times a week   Frequency of Social Gatherings with Friends and Family: More than three times a week   Attends Religious Services: Never   Marine scientist or Organizations: No   Attends Music therapist: Never   Marital Status: Married    Family History  Problem Relation Age of Onset   Lung cancer Father        Asbestos with mesothelioma   Heart disease Paternal Grandmother        ? etiology   Diabetes Paternal Grandmother    Diabetes Brother    Uterine cancer Paternal Aunt    Stroke Paternal Grandfather        > 15   Heart attack Brother 12   Allergies Sister    Aneurysm Mother        congenital   Stroke Brother        two strokes   Barrett's esophagus Brother     Review of Systems     Objective:   Vitals:   03/21/21 1412  BP: 124/80  Pulse: 81  Temp: 98.1 F (36.7 C)  SpO2: 96%   BP Readings from Last 3 Encounters:  03/21/21 124/80  02/06/21 116/74  10/20/20 120/60   Wt Readings from Last 3  Encounters:  03/21/21 141 lb 9.6 oz (64.2 kg)  02/06/21 142 lb 9.6 oz (64.7 kg)  10/20/20 141 lb (64 kg)   Body mass index is 22.85 kg/m.   Physical Exam Constitutional:      General: She is not in acute distress.    Appearance: Normal appearance.  HENT:     Head: Normocephalic.  Skin:    General: Skin is warm and dry.  Neurological:     Mental Status: She is alert and oriented to person, place, and time.     Cranial Nerves: No cranial nerve deficit.     Sensory: No sensory deficit.     Motor: No weakness.     Comments: Mild tremor in hands on occasion when she holds her hand out  Psychiatric:     Comments: Mildly anxious           Assessment & Plan:    See Problem List for Assessment and Plan of chronic medical problems.

## 2021-03-21 ENCOUNTER — Other Ambulatory Visit: Payer: Self-pay

## 2021-03-21 ENCOUNTER — Ambulatory Visit (INDEPENDENT_AMBULATORY_CARE_PROVIDER_SITE_OTHER): Payer: Medicare Other | Admitting: Internal Medicine

## 2021-03-21 VITALS — BP 124/80 | HR 81 | Temp 98.1°F | Ht 66.0 in | Wt 141.6 lb

## 2021-03-21 DIAGNOSIS — G2581 Restless legs syndrome: Secondary | ICD-10-CM

## 2021-03-21 MED ORDER — GABAPENTIN 100 MG PO CAPS: 100.0000 mg | ORAL_CAPSULE | Freq: Every day | ORAL | 3 refills | Status: DC

## 2021-03-21 NOTE — Patient Instructions (Addendum)
° ° ° ° °  Medications changes include :   gabapentin 100-200 mg at bedtime     Your prescription(s) have been sent to your pharmacy.

## 2021-03-21 NOTE — Assessment & Plan Note (Signed)
Acute on chronic Worsening of RLS symptoms, whole body shakiness started several days ago ?  Related to doxycycline and moxifloxacin eyedrops your other Having difficulty sleeping Continue Requip 1 mg nightly We will add gabapentin 100-200 mg at night-hopefully this will help her sleep and calm down some of her RLS and she will only need the short-term She will update me in a few days

## 2021-03-24 ENCOUNTER — Encounter: Payer: Self-pay | Admitting: Internal Medicine

## 2021-04-05 DIAGNOSIS — H04123 Dry eye syndrome of bilateral lacrimal glands: Secondary | ICD-10-CM | POA: Diagnosis not present

## 2021-04-05 DIAGNOSIS — H16143 Punctate keratitis, bilateral: Secondary | ICD-10-CM | POA: Diagnosis not present

## 2021-04-06 ENCOUNTER — Encounter: Payer: Medicare Other | Admitting: *Deleted

## 2021-04-06 ENCOUNTER — Other Ambulatory Visit: Payer: Self-pay

## 2021-04-06 NOTE — Research (Signed)
Title:A Randomized, Double-blind, Placebo-controlled, Phase 3 Study of the Efficacy and Safety of Inhaled Treprostinil in Subjects with Idiopathic Pulmonary Fibrosis  ? ?Dose and Duration of Treatment:Treprostinil for Inhalation 0.6 mg/mL, or placebo (randomly assigned 1:1) over a 52 week period. ? ?Protocol # RIN-PF-301; IND # [id number]; Clinical Trials.gov Identifier: [id number] ? ?[facility] Coordinator note :  ? ?This visit for Subject Elizabeth Espinoza with DOB: [birth year] on 04/06/2021 for the above protocol is baseline/Randomization Visit  and is for purpose of research.  ? ?The consent for this encounter is under Protocol Version Amendment 3 , Investigator Brochure Edition v.17, Consent Version 2.0 and is currently IRB approved.  ? ?Subject expressed continued interest and consent in continuing as a study subject. Subject confirmed that there was no change in contact information (e.g. address, telephone, email). Subject thanked for participation in research and contribution to science.  In this visit 04/06/2021 the subject's inclusion/exclusion criteria will be evaluated by Principal Investigator named Dr. Chase Caller . This research coordinator has verified that the above investigator is up to date with his training logs.  ? ?The Subject was informed that the PI Dr. Chase Caller continues to have oversight of the subject's visits and course  through relevant discussions, reviews and also specifically of this visit by routing of this note to the [place]. ? ?This visit is a key visit of  randomization. The PI is available for this visit and evaluated inclusion/exclusion criteria and signed that the subject is eligible for randomization into the study. All baseline study visit procedures and assessments were completed per the above mentioned protocol. No new AE's were reported. The subject was randomized and received the first dose of study medication without any issues. She was observed for one  hour post-dose and no problems were observed or reported. The medication appeared to be well tolerated. She will continue to dose at home per protocol. Refer to the subject's study binder for further details of the visit.  She will return in 4 weeks for the Week 4 study visit. ? ?Signed by ?Hale Drone, MS, [place]  ?Clinical Research Coordinator  ?[facility]  ?[place], [place] ?2:10 PM 04/06/2021  ?

## 2021-04-18 NOTE — Progress Notes (Deleted)
? ?_0  ID: Elizabeth Espinoza, female    DOB: August 16, 1940, 81 y.o.   MRN: [id number] ? ?No chief complaint on file. ? ? ?Referring provider: ?Binnie Rail, MD ? ?HPI: ?81 year old female, never smoked. PMH significant for ILD, aortic atherosclerosis, GERD, hypothyroidism, hyperlipidemia.  ? ?04/19/2021 ?Patient presents today for acute standard of care office visit. Research patient currently enrolled in the [place] study.  ? ?New exertional oxygen desaturations  ?Needs to rule out PE and ILD flare  ?Needs labs including D-DIMER ? ? ?Allergies  ?Allergen Reactions  ? Caffeine   ?  palpitations  ? Chlorpheniramine-Pseudoeph   ?  palpitations  ? Maxifed   ?  palpitations  ? Other Other (See Comments)  ? Pirfenidone Nausea Only  ?  Esbriet ?Causes nausea and weakness  ? ? ?Immunization History  ?Administered Date(s) Administered  ? Fluad Quad(high Dose 65+) 11/15/2020  ? Influenza Split 11/07/2010  ? Influenza Whole 10/30/2009, 11/27/2011  ? Influenza, High Dose Seasonal PF 10/27/2012, 11/05/2013, 10/01/2017, 09/11/2018, 11/23/2019  ? Influenza-Unspecified 11/24/2014, 10/07/2015, 10/08/2016  ? PFIZER(Purple Top)SARS-COV-2 Vaccination 03/14/2019, 04/06/2019, 10/27/2019  ? Pneumococcal Conjugate-13 10/07/2015  ? Pneumococcal Polysaccharide-23 07/02/2017  ? Tdap 02/11/2012  ? Zoster Recombinat (Shingrix) 10/01/2017, 12/03/2017  ? Zoster, Live 01/09/2012  ? ? ?Past Medical History:  ?Diagnosis Date  ? Allergic rhinitis   ? GERD (gastroesophageal reflux disease)   ? Glaucoma   ? SUSPECT  ? Hiatal hernia   ? Hyperlipidemia   ? LDL goal = < 100  ? Hypothyroidism   ? ILD (interstitial lung disease) ([place])   ? Lichen planopilaris   ? MVP (mitral valve prolapse)   ? documented on 2 D ECHO  ? Restless legs   ? Urticaria   ? ? ?Tobacco History: ?Social History  ? ?Tobacco Use  ?Smoking Status Never  ?Smokeless Tobacco Never  ? ?Counseling given: Not Answered ? ? ?Outpatient Medications Prior to Visit  ?Medication Sig Dispense Refill   ? alclomethasone (ACLOVATE) 0.05 % ointment Apply topically.    ? aspirin EC 81 MG tablet Take 1 tablet (81 mg total) by mouth daily. 90 tablet 3  ? betamethasone, augmented, (DIPROLENE) 0.05 % lotion Apply topically.    ? cholecalciferol (VITAMIN D3) 25 MCG (1000 UT) tablet Take 1,000 Units by mouth daily.    ? clobetasol (TEMOVATE) 0.05 % external solution SMARTSIG:1 Milliliter(s) Topical Twice Daily    ? esomeprazole (NEXIUM) 40 MG capsule Take 40 mg by mouth every morning.    ? famotidine (PEPCID) 40 MG tablet Take 1 tablet (40 mg total) by mouth daily. 90 tablet 1  ? Fexofenadine HCl (ALLEGRA PO) Take by mouth.    ? fluticasone (FLONASE) 50 MCG/ACT nasal spray Place 1 spray into both nostrils daily as needed for allergies or rhinitis.    ? gabapentin (NEURONTIN) 100 MG capsule Take 1-2 capsules (100-200 mg total) by mouth at bedtime. 60 capsule 3  ? hydroxychloroquine (PLAQUENIL) 200 MG tablet Take 200 mg by mouth 2 (two) times daily.    ? levothyroxine (SYNTHROID) 100 MCG tablet Take 1 tablet (100 mcg total) by mouth daily. 90 tablet 3  ? magnesium oxide (MAG-OX) 400 MG tablet Take 400 mg by mouth daily.    ? Multiple Vitamins-Minerals (ZINC PO) Take 1 tablet by mouth.    ? ofloxacin (OCUFLOX) 0.3 % ophthalmic solution Place 1 drop into the left eye 4 (four) times daily.    ? pravastatin (PRAVACHOL) 40 MG tablet TAKE 1  TABLET BY MOUTH  DAILY 90 tablet 3  ? Probiotic Product (PROBIOTIC ADVANCED) CAPS Take by mouth.    ? rOPINIRole (REQUIP) 0.5 MG tablet TAKE 2 TABLETS BY MOUTH  AFTER DINNER EACH NIGHT. 180 tablet 3  ? triamcinolone cream (KENALOG) 0.1 % Apply 1 application topically 2 (two) times daily.    ? vitamin B-12 (CYANOCOBALAMIN) 500 MCG tablet Take 500 mcg by mouth daily.    ? ?No facility-administered medications prior to visit.  ? ? ? ? ?Review of Systems ? ?Review of Systems ? ? ?Physical Exam ? ?There were no vitals taken for this visit. ?Physical Exam  ? ?Lab Results: ? ?CBC ?   ?Component Value  Date/Time  ? WBC 10.7 (H) 07/06/2020 1016  ? RBC 4.55 07/06/2020 1016  ? HGB 13.1 07/06/2020 1016  ? HGB 13.5 11/12/2018 1118  ? HCT 39.3 07/06/2020 1016  ? PLT 216.0 07/06/2020 1016  ? PLT 241 11/12/2018 1118  ? MCV 86.4 07/06/2020 1016  ? MCH 29.5 11/12/2018 1118  ? MCHC 33.3 07/06/2020 1016  ? RDW 15.1 07/06/2020 1016  ? LYMPHSABS 3.2 07/06/2020 1016  ? MONOABS 1.0 07/06/2020 1016  ? EOSABS 0.5 07/06/2020 1016  ? BASOSABS 0.1 07/06/2020 1016  ? ? ?BMET ?   ?Component Value Date/Time  ? NA 139 02/14/2021 1018  ? K 4.2 02/14/2021 1018  ? CL 104 02/14/2021 1018  ? CO2 28 02/14/2021 1018  ? GLUCOSE 89 02/14/2021 1018  ? BUN 13 02/14/2021 1018  ? CREATININE 0.69 02/14/2021 1018  ? CREATININE 0.78 11/12/2018 1118  ? CREATININE 0.80 11/07/2010 1027  ? CALCIUM 9.3 02/14/2021 1018  ? CALCIUM 9.3 11/12/2018 1118  ? GFRNONAA >60 11/12/2018 1118  ? GFRAA >60 11/12/2018 1118  ? ? ?BNP ?No results found for: BNP ? ?ProBNP ?No results found for: PROBNP ? ?Imaging: ?No results found. ? ? ?Assessment & Plan:  ? ?No problem-specific Assessment & Plan notes found for this encounter. ? ? ? ? ?Martyn Ehrich, NP ?04/18/2021 ? ?

## 2021-04-19 ENCOUNTER — Encounter (INDEPENDENT_AMBULATORY_CARE_PROVIDER_SITE_OTHER): Payer: Medicare Other | Admitting: Internal Medicine

## 2021-04-19 ENCOUNTER — Other Ambulatory Visit: Payer: Self-pay

## 2021-04-19 ENCOUNTER — Ambulatory Visit: Payer: Medicare Other | Admitting: Primary Care

## 2021-04-19 ENCOUNTER — Telehealth: Payer: Self-pay | Admitting: Internal Medicine

## 2021-04-19 ENCOUNTER — Encounter: Payer: Medicare Other | Admitting: *Deleted

## 2021-04-19 DIAGNOSIS — Z006 Encounter for examination for normal comparison and control in clinical research program: Secondary | ICD-10-CM

## 2021-04-19 DIAGNOSIS — J84112 Idiopathic pulmonary fibrosis: Secondary | ICD-10-CM

## 2021-04-19 NOTE — Research (Signed)
Title:A Randomized, Double-blind, Placebo-controlled, Phase 3 Study of the Efficacy and Safety of Inhaled Treprostinil in Subjects with Idiopathic Pulmonary Fibrosis  ? ?Dose and Duration of Treatment:Treprostinil for Inhalation 0.6 mg/mL, or placebo (randomly assigned 1:1) over a 52 week period. ? ?Protocol # RIN-PF-301; IND # [id number]; Clinical Trials.gov Identifier: [id number] ? ?[facility] [employer] [facility] Coordinator note :  ? ?This visit for Subject Elizabeth Espinoza with DOB: September 15, 1940 on 04/19/2021 for the above protocol is Unscheduled Visit and is for purpose of research.  ? ?The consent for this encounter is under Protocol Version Amendment 3.0 , Investigator Brochure Edition v.17, Consent Version 2.0 and is currently IRB approved.  ? ?Subject expressed continued interest and consent in continuing as a study subject. Subject confirmed that there was  no change in contact information (e.g. address, telephone, email). Subject thanked for participation in research and contribution to science.  In this visit 04/19/2021 the subject will be evaluated by Principal Investigator  named Dr. Chase Caller. This research coordinator has verified that the above investigator is up to date with his training logs.  ? ?The Subject was  informed that the PI Dr. Chase Caller continues to have oversight of the subject's visits and course  through relevant discussions, reviews and also specifically of this visit by routing of this note to the [place]. ? ?The subject called yesterday saying that she has been experiencing desaturations with exertion. Per PI instruction, the subject came in today for an unscheduled visit to be evaluated. AE's of worsening of dyspnea on exertion and  worsening of cough were reported. Unscheduled visit procedures and assessments were completed per PI instruction and per the above mentioned protocol. Refer to the subject's study binder for further details of the visit. The PI will review all results  and make recommendations for any further treatment based on that. The subject will return for the Week 4 study visit on 13Apr2023. ? ?Signed by ?Hale Drone, MS, [place]  ?Clinical Research Coordinator  ?[facility]  ?[place], [place] ?1:59 PM 04/19/2021   ?

## 2021-04-19 NOTE — Telephone Encounter (Signed)
Patient in office today. Walked performed to see if patient needs oxygen with activity. ? ?Sitting still without oxygen she was 100% RA and heart rate 70.  ? ?Lap 1: 97% RA heart rate 94 ?Lap 2: 94% RA heart rate 103 ?Lap 3: 93% RA heart rate 107 ? ?Patient walked at a fast pace and did fell short of breath but never dropped below 91% during the 3 laps. Patient was walked with a forehead probe. ?

## 2021-04-19 NOTE — Progress Notes (Signed)
Title:A Randomized, Double-blind, Placebo-controlled, Phase 3 Study of the Efficacy and Safety of Inhaled Treprostinil in Subjects with Idiopathic Pulmonary Fibrosis  ? ?Dose and Duration of Treatment:Treprostinil for Inhalation 0.6 mg/mL, or placebo (randomly assigned 1:1) over a 52 week period. ? ?Protocol # RIN-PF-301; IND # [id number]; Clinical Trials.gov Identifier: [id number] ? ?Sponsor: [employer], [place] [id number] ? ?Xxxx ?This visit for Subject Elizabeth Espinoza with DOB: [birth year] on 04/19/2021 for the above protocol is Visit/Encounter # research  and is for purpose of unmscheduled visit . Subject/LAR expressed continued interest and consent in continuing as a study subject. Subject thanked for participation in research and contribution to science.  ? ? ?S: This is an unscheduled visit for research on the above protocol ? ? ?Elizabeth Espinoza  -tells me that on 04/06/2021 she was randomized to investigational medical product versus placebo.  The very day she started noticing increased cough with investigational medical product.  Approximately a week later on 04/12/2021 she started noticing insidious worsening of shortness of breath.  Present on exertion.  Since then the shortness of breath has been progressive.  A couple of days ago she went up to 7 puffs 4 times daily with inhaled treprostinil versus placebo.  On 04/16/2021 she was walking in the park and noticed a pulse ox in the 70s.  She was quite short of breath.  We do not know the accuracy of this pulse ox meter.  Then yesterday 04/18/2021 she felt particularly bad.  She is having shortness of breath for stairs and shower.  Also complaining of nonspecific chest hurting deep inside both front and back.  She describes her shortness of breath is moderate.  She describes her cough as mild-moderate.  The cough is also worse.  It is present occasionally.  She does clear her throat quite a bit. ? ?She does have baseline dizziness when  she bends over and this is not changed.  There is no fever nausea vomiting or diarrhea.  There is no pedal edema.  There is no hemoptysis. ? ? ?January 2023 standard of care labs normal ? ?Last high-resolution CT chest October 2022 ? ?Exam ? - Right greater than left crackles.  The crackles could be more prominent than what I remember of from the past.  She has early clubbing ? ?Vital sign-stable and documented in the paper source ? ? ? ?  Latest Ref Rng & Units 04/19/2021 ? 04/07/2018 3-day of randomization in research 03/08/2021 research 03/04/2020  ?  1:00 PM 09/15/2019  ?  5:13 PM 03/16/2019  ?  8:42 AM 01/29/2018  ?  2:31 PM  ?PFT Results  ?FVC-Pre L 2.93 L 2.886 L 3.05 L 3.07   2.92   2.91   2.97    ?FVC-Predicted Pre % 107.2% ? ? 105.6% 111% 104   99   97   98    ?FVC-Post L       2.89    ?FVC-Predicted Post %       95    ?Pre FEV1/FVC % %    78   80   79   81    ?Post FEV1/FCV % %       83    ?FEV1-Pre L    2.39   2.34   2.29   2.40    ?FEV1-Predicted Pre %    108   106   102   105    ?FEV1-Post L  2.39    ?DLCO uncorrected ml/min/mmHg    12.94   12.84   12.32   14.80    ?DLCO UNC% %    63   63   60   54    ?DLCO corrected ml/min/mmHg    12.94   13.26   12.91     ?DLCO COR %Predicted %  44%  63   65   63     ?DLVA Predicted %    70   71   73   68    ? ?A/p ?Researcj subject ?IPF ? ? ? ?Cough -worsening cough.  Mild-moderate in intensity.  Most likely due to investigational medical product ? ?Worsening shortness of breath: Moderate in intensity.  Present with exertion relieved by rest ? -?  Due to study drug versus worsening disease status [she she is not on approved standard of care therapy] ? ?Plan ? - Continue with research activity for the day that includes evaluation with rest of pulmonary function test, EKG and blood work ?-After completion of research procedures might do a standard of care simple walking desaturation test ?-Will take medical and research decisions based on data ? ? ? ? ?SIGNATURE   ? ? ?Dr. Brand Males, M.D., F.C.C.P, ACRP-CPI ?Pulmonary and Critical Care Medicine ?Company secretary, [facility] ?Staff Physician, [place] ?Center Director - Interstitial Lung Disease  Program  ?[facility] and [facility] [place] ?[place], [place], [address] ? ? ?Pager: [phone], If no answer  OR between  19:00-7:00h: page [phone]  [phone]  [phone] ?Telephone (research): [phone] ? ?11:45 AM ?04/19/2021 ? ? ?11:45 AM ?04/19/2021 ? ? ? ?

## 2021-04-19 NOTE — Patient Instructions (Signed)
ICD-10-CM   1. Research study patient  Z00.6   2. IPF (idiopathic pulmonary fibrosis) (HCC)  J84.112     Per protocol 

## 2021-04-24 NOTE — Telephone Encounter (Signed)
Reviewed. Does  not need ex o2 for simple walking ? ? ? ?SIGNATURE  ? ? ?Dr. Brand Males, M.D., F.C.C.P,  ?Pulmonary and Critical Care Medicine ?Staff Physician, [place] ?Center Director - Interstitial Lung Disease  Program  ?[facility] ?[place], [place], [address] ? ?NPI Number:  NPI #[id number] ?DEA Number: [id number] ? ?Pager: [phone], If no answer  -> Check AMION or Try [phone] ?Telephone (clinical office): [phone] ?Telephone (research): [phone] ? ?6:34 PM ?04/24/2021 ? ?

## 2021-04-25 DIAGNOSIS — Z79899 Other long term (current) drug therapy: Secondary | ICD-10-CM | POA: Diagnosis not present

## 2021-04-25 DIAGNOSIS — L661 Lichen planopilaris: Secondary | ICD-10-CM | POA: Diagnosis not present

## 2021-05-04 ENCOUNTER — Encounter: Payer: Medicare Other | Admitting: *Deleted

## 2021-05-04 DIAGNOSIS — Z006 Encounter for examination for normal comparison and control in clinical research program: Secondary | ICD-10-CM

## 2021-05-04 DIAGNOSIS — J84112 Idiopathic pulmonary fibrosis: Secondary | ICD-10-CM

## 2021-05-04 NOTE — Research (Signed)
Title:A Randomized, Double-blind, Placebo-controlled, Phase 3 Study of the Efficacy and Safety of Inhaled Treprostinil in Subjects with Idiopathic Pulmonary Fibrosis  ? ?Dose and Duration of Treatment:Treprostinil for Inhalation 0.6 mg/mL, or placebo (randomly assigned 1:1) over a 52 week period. ? ?Protocol # RIN-PF-301; IND # [id number]; Clinical Trials.gov Identifier: [id number] ? ?[facility] [place] Coordinator note :  ? ?This visit for Subject Elizabeth Espinoza with DOB: [birth year] on 05/04/2021 for the above protocol is Week 4 Visit and is for purpose of research.  ? ?The consent for this encounter is under Protocol Version Amendment 3 , Investigator Brochure Edition v.17, Consent Version 2.0 and is currently IRB approved.  ? ?Subject expressed continued interest and consent in continuing as a study subject. Subject confirmed that there was no change in contact information (e.g. address, telephone, email). Subject thanked for participation in research and contribution to science.  In this visit 05/04/2021 the subject is not required to be evaluated by an investigator. ? ?The Subject was informed that the PI Dr. Chase Caller continues to have oversight of the subject's visits and course  through relevant discussions, reviews and also specifically of this visit by routing of this note to the [place]. This coordinator communicated with the PI via email today about this visit. ? ?All Week 4 study visit procedures and assessments were completed per the above mentioned protocol. No new AE's were reported. The AE of worsening increased dyspnea with exertion resolved with reduction of IP dose to 3 puffs per session on 30Mar2023. Per PI assessment today, she can now go up to 4 puffs per session. She is 100% compliant with taking study medication and completing the diary. Refer to the subject's study binder for further details of the visit. She will return in 4 weeks for the Week 8 study visit. ? ?Signed by ?Hale Drone, MS, [place]  ?Clinical Research Coordinator  ?[facility]  ?[place], [place] ?1:06 PM 05/04/2021  ?

## 2021-05-09 DIAGNOSIS — M5417 Radiculopathy, lumbosacral region: Secondary | ICD-10-CM | POA: Diagnosis not present

## 2021-05-22 ENCOUNTER — Encounter: Payer: Self-pay | Admitting: Adult Health

## 2021-05-22 ENCOUNTER — Encounter: Payer: Medicare Other | Admitting: *Deleted

## 2021-05-22 ENCOUNTER — Encounter (INDEPENDENT_AMBULATORY_CARE_PROVIDER_SITE_OTHER): Payer: Medicare Other | Admitting: Adult Health

## 2021-05-22 DIAGNOSIS — J84112 Idiopathic pulmonary fibrosis: Secondary | ICD-10-CM

## 2021-05-22 DIAGNOSIS — Z006 Encounter for examination for normal comparison and control in clinical research program: Secondary | ICD-10-CM

## 2021-05-22 DIAGNOSIS — J069 Acute upper respiratory infection, unspecified: Secondary | ICD-10-CM | POA: Insufficient documentation

## 2021-05-22 MED ORDER — BENZONATATE 200 MG PO CAPS: 200.0000 mg | ORAL_CAPSULE | Freq: Three times a day (TID) | ORAL | 1 refills | Status: DC | PRN

## 2021-05-22 MED ORDER — AZITHROMYCIN 250 MG PO TABS: ORAL_TABLET | ORAL | 0 refills | Status: AC

## 2021-05-22 NOTE — Patient Instructions (Addendum)
Zpack to have on hold if symptoms worsen with discolored mucus .  ?Mucinex DM Twice daily  As needed  cough/congestion  ?Continue with Allegra , flonase and saline nasal rinses .  ?Tessalon Three times a day  As needed  cough  ?Follow up with Dr. Chase Caller as planned and As needed   ?Please contact office for sooner follow up if symptoms do not improve or worsen or seek emergency care  ? ?

## 2021-05-22 NOTE — Research (Signed)
Title:A Randomized, Double-blind, Placebo-controlled, Phase 3 Study of the Efficacy and Safety of Inhaled Treprostinil in Subjects with Idiopathic Pulmonary Fibrosis  ? ?Dose and Duration of Treatment:Treprostinil for Inhalation 0.6 mg/mL, or placebo (randomly assigned 1:1) over a 52 week period. ? ?Protocol # RIN-PF-301; IND # [id number]; Clinical Trials.gov Identifier: [id number] ? ?[facility] [place] Coordinator note :  ? ?This visit for Subject Elizabeth Espinoza with DOB: [birth year] on 05/22/2021 for the above protocol is Unscheduled Visit  and is for purpose of research.  ? ?The consent for this encounter is under Protocol Version Amendment 3 , Investigator Brochure Edition v.17,  Consent Version 2.0 and is currently IRB approved.  ? ?Subject expressed continued interest and consent in continuing as a study subject. Subject confirmed that there was no change in contact information (e.g. address, telephone, email). Subject thanked for participation in research and contribution to science.  In this visit 05/22/2021 the subject will be evaluated by Sub-Investigator named Rexene Edison, NP. This research coordinator has verified that the above investigator is up to date with her training logs.  ? ?The Subject was informed that the PI Dr. Chase Caller continues to have oversight of the subject's visits and course  through relevant discussions, reviews and also specifically of this visit by routing of this note to the [place]. ? ?The subject came in today for an unscheduled visit. She had called in the morning stating that she had been coughing since 29Apr2023, both day and night, and also having head congestion with some yellowish mucus, fullness in the ears, and throat irritation. No fever. This coordinator consulted  via text with the PI and he asked that she be seen as an unscheduled visit with a sub-I. The sub-I did an exam and diagnosed an upper respiratory infection. Unscheduled visit assessments were  performed as noted in the subject's study binder. Please refer to the subject's study binder for further details of the visit.  She will return next week for the Week 8 study visit. ? ?Signed by ?Hale Drone, MS, [place]  ?Clinical Research Coordinator / Nurse ?[facility]  ?[place], [place] ?3:06 PM 05/22/2021  ?

## 2021-05-22 NOTE — Progress Notes (Signed)
? ?_0  ID: Elizabeth Espinoza, female    DOB: 06-14-1940, 81 y.o.   MRN: [id number] ? ?Chief Complaint  ?Patient presents with  ? Research  ? ? ?Referring provider: ?Binnie Rail, MD ? ?HPI: ?81 year old female followed for interstitial lung disease ? ?Title:A Randomized, Double-blind, Placebo-controlled, Phase 3 Study of the Efficacy and Safety of Inhaled Treprostinil in Subjects with Idiopathic Pulmonary Fibrosis  ? ?Dose and Duration of Treatment:Treprostinil for Inhalation 0.6 mg/mL, or placebo (randomly assigned 1:1) over a 52 week period. ? ?Protocol # RIN-PF-301; IND # [id number]; Clinical Trials.gov Identifier: [id number] ? ?Sponsor: [employer], [place] [address] ? ?Protocol Version/Amendment: Amendment 3, date 25Apr2022 for 05/22/2021  ?Psychologist, forensic Product:  Treprostinil for Inhalation, 0.6 mg/mL ? ?Key Features of the Drug: ?Treprostinil (LRX-15, 15AU81, UT-15) is a stable tricyclic benzindene analogue of the naturally occurring prostacyclin (PGI2; epoprostenol), a member of the eicosanoid family of autocoids; these compounds occur widely in tissues and have important pharmacological properties with potent activity, especially on the cardiovascular system and smooth muscle ([place]). ?An inhalation solution of treprostinil, 0.6 mg/mL, delivered using an ultrasonic nebulizer, is currently FDA approved to treat pulmonary arterial hypertension (PAH); [WHO Group 1, PH-ILD; WHO Group 3. ?Inhaled treprostinil is NOW being evaluated as primary treatment of IPF, idiopathic pulmonary fibrosis.  ? ?Key Inclusion Criteria: ??81 years of age ?Diagnosis of IPF based on the 2018 ATS/ERS/JRS/ALAT Clinical Practice Guideline (FVC ?45% predicted at Screening).  ?Subjects on pirfenidone or nintedanib must be on a stable and optimized dose for ?30 days prior to Baseline. Concomitant use of both pirfenidone and nintedanib is not permitted. ?Males with a partner of childbearing  potential must use a condom for the duration of treatment and for at least 48 hours after discontinuing study drug.  ? ?Key Exclusion Criteria: ?Subject is pregnant or lactating.  ?FEV1/FVC <0.70 at Screening.  ?Prior intolerance or significant lack of efficacy to a prostacyclin or prostacyclin analogue that resulted in discontinuation or inability to effectively titrate that therapy.  ?The subject has received any PAH-approved therapy, including prostacyclin therapy (epoprostenol, treprostinil, iloprost, or beraprost; except for acute vasoreactivity testing), IP receptor agonists (selexipag), endothelin receptor antagonists, phosphodiesterase type 5 inhibitors (PDE5-Is), or soluble guanylate cyclase stimulators within 60 days prior to Baseline. As needed use of a PDE5-I for erectile dysfunction is permitted, provided no doses are taken within 48 hours of any study-related efficacy assessments.  ?Use of any of the following medications:  ?`     a. Azathioprine (AZA), cyclosporine, mycophenolate mofetil, tacroliumus, oral ?          corticosteroids (OCS) >20 mg/day or the combination of  ?          OCS+AZA+N-acetylcysteine within 30 days prior to Baseline.  ?      b. Cyclophosphamide within 60 days prior to Baseline  ?      c. Rituximab within 6 months prior to Baseline  ? Receiving >10 L/min of oxygen at rest at Baseline.  ? Exacerbation of IPF or active pulmonary or upper respiratory infection within 30 days prior to Baseline. Subjects must have completed any antibiotic or steroid regimens for treatment of the infection or acute exacerbation more than 30 days prior to Baseline to be eligible. If hospitalized for an acute exacerbation of IPF or a pulmonary or upper respiratory infection, subjects must have been discharged more than 90 days prior to Baseline to be eligible.  ?Uncontrolled cardiac disease, defined as myocardial infarction within 6  months prior to Baseline or unstable angina within 30 days prior to  Baseline. ?Use of any investigational drug/device or participation in any investigational study in which the subject received a medical intervention (ie, procedure, device, medication/supplement) within 30 days prior to Screening. Subjects participating in non-interventional, observational, or registry studies are eligible.  ?Life expectancy <6 months due to IPF or a concomitant illness.  ?Acute pulmonary embolism within 90 days prior to Baseline.  ? ?Pharmacodynamics:  (Dose-Response Relationships) - In a clinical study of 241 healthy volunteers, single doses of Tyvaso 54 ?g (the target maintenance dose per session) and 84 ?g (supratherapeutic inhalation dose) prolonged the QTc interval by approximately 10 ms. The QTc effect dissipated rapidly as the concentration of treprostinil decreased. ? ?Pharmacokinetics: (ADME) - Treprostinil is substantially metabolized by the liver, primarily by CYP2C8.  The elimination of treprostinil (following [place] administration of treprostinil) is biphasic, with a terminal elimination t1?2 of approximately 4 hours.   ? ?Contraindications:   ?None ? ?Special Warnings/Considerations: ?Treprostinil undergoes substantial hepatic metabolism; thus, liver impairment could result in decreased metabolism and increased systemic exposure to treprostinil potentiating the occurrence and/or severity of AEs and/or overdose. ?There are no adequate and well-controlled studies with Tyvaso in pregnant women. There are risks to the mother and the fetus associated with PAH. ?Patients who discontinue the use of any effective therapy may be expected to notice a decline in their clinical status. Patients who abruptly discontinue therapy (ie, miss 2 or more doses), or markedly reduce the dose, are at risk for worsening of PAH symptoms or a rebound in [facility]. ?There is no evidence to suggest or suspect that patients taking inhaled treprostinil would be at risk for withdrawal symptoms or rebound effects.   ?Treprostinil inhibits platelet aggregation and increases the risk of bleeding, particularly with concomitant anticoagulants and antiplatelet therapies. ?Treprostinil is a pulmonary and systemic vasodilator. In patients with low systemic arterial pressure, inhaled treprostinil solution may cause symptomatic hypotension, which may present as but is not limited to light-headedness, dizziness, blurred or faded vision, and syncope. Concomitant administration of treprostinil with diuretics, antihypertensive agents, or other vasodilators that may lower blood pressure increases the risk of symptomatic hypotension. ?Signs and symptoms of overdose with inhaled treprostinil solution may correspond to dose-limiting pharmacologic effects, including diarrhea, flushing, headache, hypotension, nausea, and vomiting. Most events are self-limiting and resolve with reduction or withholding of inhaled treprostinil solution. ? ?Interactions:   ?           Drug Interaction Studies Conducted with Treprostinil: ?Study # P01:08: Treprostinil formulation [place] Remodulin vs Acetaminophen, to evaluate effect of acetaminophen on Treprostinil pK, showed no effect. ?Study # P01:12: Treprostinil formulation  Remodulin vs Warfarin, to evaluate effect of Warfarin on Treprostinil pK/PD, showed no effect. ?Study TDE-PH-105: Treprostinil formulation Orenitram vs Bosentan, to evaluate effect of Bosentan on Treprostinil pK, showed no effect. ?Study TDE-PH-106: Treprostinil formulation Orenitram vs Sildenafil, to evaluate effect of Sildenafil on Treprostinil pK, showed no effect. ?Study TDE-PH-109: Treprostinil formulation Orenitram vs Rifampicin (CYP2C8/9 inducer), to evaluate the effect of inducing the ASN0N3 metabolic pathway on the pK of Treprostinil, confirmed metabolic pathway via ZJQ7H4/1. Expected interaction resulting in reduced Cmax and AUC. ?Study TDE-PH-110: Treprostinil formulation Orenitram vs Gemfibrozil (CYP2C8  inhibitor), to evaluate the  effect of inhibiting the PFX9K2 metabolic pathway on the pK of Treprostinil, confirmed metabolic pathway primarily via CYP2C8. Expected interaction resulting in significantly increased Cmax and AUC. ?Study TDE-PH-110:

## 2021-05-22 NOTE — Assessment & Plan Note (Signed)
Continue study protocol.  URI reported as an adverse event per study protocol ?

## 2021-05-22 NOTE — Assessment & Plan Note (Signed)
Appears to have an upper respiratory infection.  There could be an allergic component with increased pollen outside.  She has no significant fever.  Took a COVID rapid swab test at home.  And was negative. ?O2 saturations are normal and at baseline.  Appetite is good with no nausea vomiting or diarrhea. ?May have an early sinus infection.  If given a Z-Pak to have on hold in case symptoms worsen with discolored mucus.  Otherwise to continue with symptomatic treatment. ?As she is in a research study above full report as a adverse event ? ?Plan  ?Patient Instructions  ?Zpack to have on hold if symptoms worsen with discolored mucus .  ?Mucinex DM Twice daily  As needed  cough/congestion  ?Continue with Allegra , flonase and saline nasal rinses .  ?Tessalon Three times a day  As needed  cough  ?Follow up with Dr. Chase Caller as planned and As needed   ?Please contact office for sooner follow up if symptoms do not improve or worsen or seek emergency care  ? ?  ? ?

## 2021-05-23 ENCOUNTER — Telehealth: Payer: Self-pay | Admitting: Internal Medicine

## 2021-05-23 NOTE — Telephone Encounter (Signed)
Elizabeth Espinoza called yesterday stating that she had been coughing day and night since Saturday 29Apr2023. She also had fullness in her ears and was blowing her nose a lot. She had coughed up yellowish mucus one time. No fever. Per your request, she was seen for an unscheduled research visit by sub-I Rexene Edison, NP and was diagnosed with an upper respiratory infection and prescribed antibiotic and medication for cough to be used if she was not better in a couple of days. At this visit, her spirometry was improved over baseline ( FVC yesterday was 113.9% predicted and baseline was 105.6%). She stated that she felt that her breathing was better, and she had titrated up to 9 puffs per session. She called again this morning to advise that she had started the antibiotic last night since she was feeling so bad (Z pack) and she is feeling better today. She will let us know if any further issues arise. Nothing further needed at this time. ?Hale Drone, [place], [place] ?Study Coordinator ?

## 2021-05-24 NOTE — Telephone Encounter (Signed)
Thanks Reba ? ?PI OVERSIGHT ATTESTATION ? ?I the Principal Investigator (PI) for the above mentioned study attest that I reviewed the mentioned  clinical research coordinator  notes on research subject  ERNESTENE COOVER  born [birth year] . I  agree with the findings mentioned above ? ? ?Dr.Faustina Gebert Chase Caller, MD ?Pulmonary and Critical Care Medicine ?Research Investigator & Staff Physician ?[facility] ?[facility] ? ?[facility] ?Pager: [phone], If no answer or between  15:00h - 7:00h: call [phone] ? ?05/24/2021 ?7:24 AM ? ? ? ? ? ?

## 2021-06-01 ENCOUNTER — Encounter: Payer: Medicare Other | Admitting: *Deleted

## 2021-06-01 DIAGNOSIS — Z006 Encounter for examination for normal comparison and control in clinical research program: Secondary | ICD-10-CM

## 2021-06-01 DIAGNOSIS — J84112 Idiopathic pulmonary fibrosis: Secondary | ICD-10-CM

## 2021-06-01 NOTE — Research (Signed)
Title:A Randomized, Double-blind, Placebo-controlled, Phase 3 Study of the Efficacy and Safety of Inhaled Treprostinil in Subjects with Idiopathic Pulmonary Fibrosis   Dose and Duration of Treatment:Treprostinil for Inhalation 0.6 mg/mL, or placebo (randomly assigned 1:1) over a 52 week period.  Protocol # RIN-PF-301; IND # [id number]; Clinical Trials.gov Identifier: [id number]  [facility] [place] Coordinator note :   This visit for Subject Elizabeth Espinoza with DOB: [birth year] on 06/01/2021 for the above protocol is week 8 Visit and is for purpose of research.   The consent for this encounter is under Protocol Version Amendment 3 , Investigator Brochure Edition v.17, Consent Version 2.0 and is currently IRB approved.   Subject expressed continued interest and consent in continuing as a study subject. Subject confirmed that there was no change in contact information (e.g. address, telephone, email). Subject thanked for participation in research and contribution to science.  In this visit 06/01/2021 the subject is not required to be evaluated by an investigator.  The Subject was informed that the PI Dr. Chase Caller continues to have oversight of the subject's visits and course  through relevant discussions, reviews and also specifically of this visit by routing of this note to the [place].  All Week 8 study visit procedures and assessments were completed per the above mentioned protocol. No new AE's were reported. Ongoing AE's are documented in the subject's study binder. The subject is 100% compliant with taking study medication and with completing the diary. Refer to the subject's study binder for further details of the visit. She will return in 8 weeks for the Week 16 study visit.  Signed by Hale Drone, MS, [place] Coordinator  [place], [place] 10:32 AM 06/01/2021

## 2021-06-09 ENCOUNTER — Other Ambulatory Visit: Payer: Self-pay | Admitting: Internal Medicine

## 2021-06-20 ENCOUNTER — Telehealth: Payer: Self-pay | Admitting: Internal Medicine

## 2021-06-20 DIAGNOSIS — J329 Chronic sinusitis, unspecified: Secondary | ICD-10-CM

## 2021-06-20 NOTE — Telephone Encounter (Signed)
Triage  Though this came from [place]  - I like you to address this  - do CT sinus without contrast due to chronic sinusitis  - ok to take dramaamine - return to see Tammy or me next 3-7 days but after CT

## 2021-06-20 NOTE — Telephone Encounter (Signed)
I received a call today from Elizabeth Espinoza stating that she woke up today with vertigo; she states that she has had this in the past,when she was in her 7's but not since then. She is on the [facility] study and had her week 8 visit on [id number] at which time she was doing 12 puffs per session of study med. She had an unscheduled visit with Tammy Parrett on [phone] and was given antibiotics for upper respiratory infection, which she completed. At a phone call with the patient on 10May, she stated that she was still having sinus congestion and was coughing and blowing yellowish mucus still. At that time, Rexene Edison had me to tell her that it sounded ok and she probably just needed more time to completely recover post antibiotics. Today, the patient states that she is still having sinus and chest congestion and is still coughing and blowing out yellowish mucus. She wants to know if she can take dramamine for the vertigo today. Please advise. Hale Drone, MS, Warehouse manager

## 2021-06-20 NOTE — Telephone Encounter (Signed)
I spoke with the pt and notified of response per MR. She verbalized understanding and was agreeable to  the sinus CT and this has been ordered with note to [facility] to make appt with MR or TP for after the scan.

## 2021-06-21 ENCOUNTER — Telehealth: Payer: Self-pay | Admitting: Internal Medicine

## 2021-06-22 NOTE — Telephone Encounter (Signed)
Patient has been scheduled for a follow up with MR 6/9.

## 2021-06-27 ENCOUNTER — Other Ambulatory Visit: Payer: Self-pay | Admitting: Internal Medicine

## 2021-06-28 ENCOUNTER — Ambulatory Visit
Admission: RE | Admit: 2021-06-28 | Discharge: 2021-06-28 | Disposition: A | Discharge status: Home / Self Care | Payer: Medicare Other | Source: Ambulatory Visit | Attending: Internal Medicine | Admitting: Internal Medicine

## 2021-06-28 DIAGNOSIS — J341 Cyst and mucocele of nose and nasal sinus: Secondary | ICD-10-CM | POA: Diagnosis not present

## 2021-06-28 DIAGNOSIS — J329 Chronic sinusitis, unspecified: Secondary | ICD-10-CM | POA: Diagnosis not present

## 2021-06-28 DIAGNOSIS — J3489 Other specified disorders of nose and nasal sinuses: Secondary | ICD-10-CM | POA: Diagnosis not present

## 2021-06-29 ENCOUNTER — Encounter: Payer: Self-pay | Admitting: Internal Medicine

## 2021-06-29 DIAGNOSIS — E782 Mixed hyperlipidemia: Secondary | ICD-10-CM

## 2021-06-29 DIAGNOSIS — R7303 Prediabetes: Secondary | ICD-10-CM

## 2021-06-29 DIAGNOSIS — E039 Hypothyroidism, unspecified: Secondary | ICD-10-CM

## 2021-06-30 ENCOUNTER — Ambulatory Visit: Payer: Medicare Other | Admitting: Internal Medicine

## 2021-06-30 ENCOUNTER — Encounter: Payer: Self-pay | Admitting: Internal Medicine

## 2021-06-30 VITALS — BP 132/76 | HR 73 | Temp 97.7°F | Ht 66.0 in | Wt 139.2 lb

## 2021-06-30 DIAGNOSIS — J84112 Idiopathic pulmonary fibrosis: Secondary | ICD-10-CM | POA: Diagnosis not present

## 2021-06-30 NOTE — Progress Notes (Signed)
IOV 12/04/2017: Dr Elizabeth Espinoza: [name] is a 81 year old never smoker with a history of allergic rhinitis, GERD, hypothyroidism, MVP.  She is been seen in our office in the past by Dr. Gwenette Espinoza for restless leg syndrome-she still treated for this with Requip. On allegra, nexium prn.   She is referred today for dyspnea.  She reports that she has had an exercise routine where she walked w her husband 2 miles, exercises at the [facility], for several years. She has noticed more SOB, especially with mild hills. Sometimes now has to stop briefly to complete the walk. Occasional chest tightness, no wheeze. No overt CP. She reports daily cough, often in the am, sometimes clear mucous. She is on allegra. She had a walking stress test 09/2014 > reassuring ECG w exercise. Weight has been stable. Her last TSH was 06/25/17, 0.73.   Review of her notes shows hx possible GGI on prior CT 2014.   ROV 02/26/18 Elizabeth Espinoza --81 year old never smoker with a history of mitral valve prolapse whom I saw earlier this year for exertional dyspnea and chronic cough.  Pulmonary function testing consistent with restriction.  She has GERD and allergic rhinitis both of which could be contributing to her chronic cough.  I performed a high-resolution CT scan of the chest on 1/20 and reviewed today, this shows some patchy confluent subpleural reticular disease and groundglass attenuation with some minimal traction bronchiectasis and no frank honeycomb change.  This reflects a progression compared with 2014 and is an NSIP pattern.  Walking oximetry at her last visit did not show any exertional desaturation. She is still coughing, is using fexofenadine, takes nexium qod.   [place], had pesticide exposure.   ROV 08/26/2018 Elizabeth Espinoza --follow-up visit for 81 year old woman with a history of mitral valve prolapse, chronic cough, dyspnea with restrictive lung disease noted on pulmonary function testing.  High Res CT scan of the chest  shows some mild interstitial disease.  She was also having cough - better with flonase; still on nexium, allegra. Her exertional tolerance is improved, she has been exercising more. She remains on Requip for RLS.   We performed autoimmune labs last time, ANA positive at low titer (1: 80), SSA and SSB negative, RNP negative, RF negative, CCP negative, Anti-Smith negative, anti-SCL negative, DS DNA negative, anti-Jo negative, aldolase negative. She hasn't been seen in ILD clinic yet.    OV 10/08/2018  Subjective:  Patient ID: Elizabeth Espinoza, female , DOB: 02/12/1940 , age 4 y.o. , MRN: [id number] , ADDRESS: [place] [address]   10/08/2018 -   Chief Complaint  Patient presents with   Interstitial Lung Disease    Breathing the same as it was during August 2020 office visit with Dr. Lamonte Elizabeth Espinoza     HPI Elizabeth Espinoza 81 y.o. -has been referred to the interstitial lung disease clinic because of findings of interstitial lung disease.  History is gathered from talking to her, review of Dr. Collene Espinoza notes and also the integrated ILD questionnaire.  Briefly, she tells me that she was working out at [employer] and would just notice occasional dyspnea but she really did not compare it with other people.  Then in October 2019 she started walking 3 miles daily except on the days it rains with a husband.  During this time she noticed that she was falling really behind because of shortness of breath and exertional fatigue.  This resulted  in subsequent evaluation all documented above.  Findings of interstitial lung disease with subpleural reticulation suggestive of an alternative diagnosis.  Autoimmune profile essentially negative other than trace positive ANA.  She tells me that her significant major problem is exertional dyspnea when walking stairs or walking several miles.  She did not desaturate in our office several months ago.  She does not know if she desaturates when she exerts  walking 3 miles.  Pulmonary function test earlier this year is just isolated low DLCO.  She also has like a cough.  Overall since the onset of the symptoms by exercising herself more and conditioning she is somewhat better.  She did see Dr. Kirk Espinoza in May 2020 and in June 2020 had a coronary calcium CT which appears to have no calcium deposits.   [place] Integrated Comprehensive ILD Questionnaire  Symptoms:  -Dyspnea started suddenly and since it started it is better.  She says it is been present for years although she did tell me that it is only there since October 2019 when she noticed that.  Severity is listed below.  She does have a cough almost 1 year.  Since it started it is better.  It is mostly in the morning.  She does bring up some phlegm.  Early on in the morning it is green or yellow.  Since it started it is the same/better.  There is no wheezing.  She does have some chest tightness with this when she walks.  It is relieved by rest.  Cardiac work-up in June 2020 showed no calcium deposits.   Past Medical History : Positive for chronic longstanding acid reflux disease and thyroid disease..  In addition CT scan from January 2020 shows hiatal hernia that is small..  This presence of  sclerosis in the bony structures in January 2020.-Primary care physician has been sent a message today.  There is no asthma or COPD or heart failure rheumatoid arthritis or collagen vascular disease.  She does have GERD and hiatal hernia for several years to decades.  No sleep apnea.  No blood clots.  No hepatitis.  No tuberculosis.  No pleurisy.  , ANA positive at low titer (1: 80), SSA and SSB negative, RNP negative, RF negative, CCP negative, Anti-Smith negative, anti-SCL negative, DS DNA negative, anti-Jo negative, aldolase negative. She hasn't been seen in ILD clinic yet.    ROS:  -She does have fatigue for the last several years.  She does have some back and hip issues.  She does have dry eyes.  She  does have like some dysphagia.  There is presence of hiatal hernia-there is a small.  Acid reflux for several decades.  She also reports presence of nonspecific rash   FAMILY HISTORY of LUNG DISEASE: * -Her father died of mesothelioma in nineteen 83/1984 at the age of 72 otherwise no lung disease.   EXPOSURE HISTORY:   -When she was 17 she smokes cigarettes but otherwise no cigarette or tobacco use or electronic cigarette.  Never smoked marijuana.  No cocaine use no intravenous drug use.   HOME and HOBBY DETAILS :  -Single[place]family home [place] setting for the last 16 years in a 80 year old home.  No mold or mildew exposure in the [facility] duct or CPAP mask or humidifier.  No mold or mildew in the bathroom.  No pet birds in the house.  No misting [place].  No feather pillows no feather duvet.  No musical instruments.  She does some occasional gardening which  she likes.  She does do some fine-needle work.   OCCUPATIONAL HISTORY (122 questions) :  = Essentially negative except for the fact when she was a child she did some tobacco growing.  She has done home gardening for 50 years.    PULMONARY TOXICITY HISTORY (27 items):  denies  Results for Elizabeth Espinoza, Elizabeth Espinoza (MRN [id number]) as of 11/07/2018 11:20  Ref. Range 02/26/2018 12:17  Anti Nuclear Antibody (ANA) Latest Ref Range: NEGATIVE  POSITIVE (A)  ANA Pattern 1 Unknown Nuclear, Speckled (A)  ANA Titer 1 Latest Units: titer 1:80 (H)  Anti JO-1 Latest Ref Range: 0.0 - 0.9 AI <4.6  Cyclic Citrullin Peptide Ab Latest Units: UNITS <16  ds DNA Ab Latest Units: IU/mL <1  RA Latex Turbid. Latest Ref Range: <14 IU/mL <14  ENA SM Ab Ser-aCnc Latest Ref Range: <1.0 NEG AI <1.0 NEG  Ribonucleic Protein(ENA) Antibody, IgG Latest Ref Range: <1.0 NEG AI <1.0 NEG  SSA (Ro) (ENA) Antibody, IgG Latest Ref Range: <1.0 NEG AI <1.0 NEG  SSB (La) (ENA) Antibody, IgG Latest Ref Range: <1.0 NEG AI <1.0 NEG  Scleroderma (Scl-70) (ENA) Antibody, IgG Latest Ref Range: <1.0  NEG AI <1.0 NEG   Results for Elizabeth Espinoza, Elizabeth Espinoza (MRN [id number]) as of 11/07/2018 11:20  Ref. Range 01/29/2018 14:31  FVC-Pre Latest Units: L 2.97  FVC-%Pred-Pre Latest Units: % 98  Results for KINSLEI, LABINE (MRN [id number]) as of 11/07/2018 11:20  Ref. Range 01/29/2018 14:31  DLCO unc Latest Units: ml/min/mmHg 14.80  DLCO unc % pred Latest Units: % 54    OV 11/07/2018  Subjective:  Patient ID: Elizabeth Espinoza, female , DOB: 1940-07-02 , age 85 y.o. , MRN: [id number] , ADDRESS: [place] [address]   11/07/2018 -   Chief Complaint  Patient presents with   Follow-up    Patient reports that she has sob with any exertion.    Follow-up interstitial lung disease  HPI Elizabeth Espinoza 81 y.o. -last seen September 2020.  After that she was supposed to have follow-up high-resolution CT chest and spirometry DLCO.  For some reason the spirometry DLCO is not done.  In the interim her symptoms remain the same as shown by the symptom score below.  She does note when she does heavy exertion such as climbing stairs or long uphill walks her pulse ox drops to 84% but quickly regains.  She is not interested in portable oxygen.  She had a repeat high-resolution CT chest in September 2020 and when compared to January 2020 there is no significant change.  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  I personally visualized the film.  There is diffuse bilateral subpleural reticulation and some traction bronchiectasis.  I myself would say it may be this is indeterminate for UIP.  But there is some minimal air trapping and she is done some gardening work.  There is no upper zonal predominance that would fit in with hypersensitive pneumonitis.  She has incidental findings of bony sclerosis that is again repeated in the CT scan.  Her primary care physician Billey Gosling evaluated her.  I reviewed the note.  She is been  referred to Dr. Narda Rutherford in hematology who she is seeing November 12, 2018.  She is very confused about the fact that she has a bony sclerosis problem for which she is being referred to a blood doctor [hematologist] and she thought she is  having a blood test that was ordered by me.  I reviewed the chart and clarified this concepts   Ct Chest High Resolution  Result Date: 11/05/2018 CLINICAL DATA:  81 year old female with history of interstitial lung disease. Increased shortness of breath and cough over the past year. EXAM: CT CHEST WITHOUT CONTRAST TECHNIQUE: Multidetector CT imaging of the chest was performed following the standard protocol without intravenous contrast. High resolution imaging of the lungs, as well as inspiratory and expiratory imaging, was performed. COMPARISON:  High-resolution chest CT 02/10/2018. FINDINGS: Cardiovascular: Heart size is normal. There is no significant pericardial fluid, thickening or pericardial calcification. There is aortic atherosclerosis, as well as atherosclerosis of the great vessels of the mediastinum and the coronary arteries, including calcified atherosclerotic plaque in the left anterior descending and right coronary arteries. Mild calcifications of the mitral annulus. Mediastinum/Nodes: No pathologically enlarged mediastinal or hilar lymph nodes. Please note that accurate exclusion of hilar adenopathy is limited on noncontrast CT scans. Esophagus is unremarkable in appearance. No axillary lymphadenopathy. Lungs/Pleura: High-resolution images demonstrates some patchy areas of peripheral predominant septal thickening and subpleural reticulation, with mild cylindrical bronchiectasis and peripheral bronchiolectasis. No frank honeycombing confidently identified at this time. These findings have no definitive craniocaudal gradient. In the periphery of the mid to upper lungs there also some plaque-like areas of apparent pleuroparenchymal scarring and volume loss.  Inspiratory and expiratory imaging demonstrates minimal air trapping indicative of very mild small airways disease. Overall, these imaging findings appear stable compared to the prior study. Upper Abdomen: Aortic atherosclerosis. Musculoskeletal: Mild diffuse sclerosis throughout the visualized axial and appendicular skeleton, similar to prior examinations. There are no definite focal aggressive appearing lytic or blastic lesions noted in the visualized portions of the skeleton. IMPRESSION: 1. 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 2. Aortic atherosclerosis, in addition to 2 vessel coronary artery disease. Assessment for potential risk factor modification, dietary therapy or pharmacologic therapy may be warranted, if clinically indicated. 3. Persistent mild diffuse sclerosis throughout the visualized osseous structures without discrete aggressive appearing osseous lesions. Clinical correlation for signs and symptoms of potential infiltrative process such as myelofibrosis is suggested. Aortic Atherosclerosis (ICD10-I70.0). Electronically Signed   By: Vinnie Langton M.D.   On: 11/05/2018 14:36     OV 12/22/2018  Subjective:  Patient ID: Elizabeth Espinoza, female , DOB: 1940-04-25 , age 52 y.o. , MRN: [id number] , ADDRESS: [place] [address]   12/22/2018 -   Chief Complaint  Patient presents with   Follow-up    Pt states she has been doing good since last visit. Pt is still coughing and will get up clear mucus in the morning.   Follow-up interstitial lung disease with CT scan October 2020 being indeterminate versus alternate diagnosis.  History of gardening.  Trace  autoimmune ANA positive.  Mild progression since 2014  HPI Elizabeth Espinoza 81 y.o. -returns for follow-up.  She presents with her husband who I am meeting for the first time.  In the interim she met Dr. Roxan Hockey thoracic surgeon for surgical lung biopsy.  Her husband also met with him.  I reviewed the note.  She tells me that given the morbidity with surgical lung biopsy she wants to  undergo bronchoscopy with lavage and transbronchial biopsy first.  She understands the inherent limitations of these procedure in terms of diagnosis.  But she wants to take the lower risk profile.  Overall she feels stable.  Her symptom score is improved compared to the past.  Her walking desaturation test shows exaggerated drop in pulse ox but this was done with her wearing the mask.  She did not feel any dyspnea.  In terms of her bony sclerosis she has seen Dr. Lorenso Courier at [facility].  I reviewed the note.  He has reassured her.  Risks of pneumothorax, hemothorax, sedation/anesthesia complications such as cardiac or respiratory arrest or hypotension, stroke and bleeding all explained. Benefits of diagnosis but limitations of non-diagnosis also explained. Patient verbalized understanding and wished to proceed.   They want to have the bronchoscopy after the holidays of Christmas and new year.  This is because their house is undergoing remodeling currently.       OV 03/16/2019  Subjective:  Patient ID: Elizabeth Espinoza, female , DOB: November 13, 1940 , age 38 y.o. , MRN: [id number] , ADDRESS: [place] [address]   03/16/2019 -   Chief Complaint  Patient presents with   Follow-up    PFT performed today.  Pt states she has been doing okay since last visit and states her breathing is about the same.     Finally able to review esults of envisia send out test to [place]. Date of test is 02/05/2019. 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 -returns for follow-up to discuss  bronchoscopy and lavage results.  In the interim no new respiratory issues.  They are all stable.  However she is having significant musculoskeletal issues with shoulder pain.  Serology from a year ago was negative.  They wanted to know about relatedness.  I told him it is probably not related.  We went over the bronchoscopy lavage results which showed some dominance of neutrophils consistent with UIP.  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   We had a long discussion about the benefits, risks and limitations of antifibrotic therapy.  We also discussed the choice between pirfenidone and nintedanib.       Results for Elizabeth Espinoza, Elizabeth Espinoza (MRN [id number]) as of 03/16/2019 11:12  Ref. Range 01/29/2018 14:31 03/16/2019 08:42  FVC-Pre Latest Units: L 2.97 2.91  FVC-%Pred-Pre Latest Units: % 98 97   Results for Elizabeth Espinoza, Elizabeth Espinoza (MRN [id number]) as of 03/16/2019 11:12  Ref. Range 01/29/2018 14:31 03/16/2019 08:42  DLCO unc Latest Units: ml/min/mmHg 14.80 12.32  DLCO unc % pred Latest Units: % 54 60     OV 06/11/2019 - telephine visit. Patient identified with 2 pHI, risks, benefit, limitations of tele visit explained  Subjective:  Patient ID: Elizabeth Espinoza, female , DOB: October 18, 1940 , age 88 y.o. , MRN: [id number] , ADDRESS: [place] [address] ate of test is 02/05/2019. 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 -in this telephone visit patient is now on pirfenidone.  She says she is on pirfenidone since mid April 2021.  She is on 3 pills 3 times a day.  She spacing them  4 hours apart but having some intermittent nausea every few days.  She did have an urticaria but that was before she started pirfenidone was related to Covid vaccine  that is now resolved.  She is asking about exercising at the [facility] and Covid precautions.  I advised her because she is vaccinated that the risk is low but not 0 and to take adequate precautions as tolerated.  She had liver function test and this is normal.  Her next appointment for pulmonary function test is in mid July 2021.  Recommended she make a face-to-face visit with me at that time.   OV 08/21/2019   Subjective:  Patient ID: Elizabeth Espinoza, female , DOB: [birth year], age 42 y.o. years. , MRN: [id number],  ADDRESS: [place] [address] PCP  Binnie Rail, MD Providers : Treatment Team:  Attending Provider: Brand Males, MD  Type of visit: Telephone Circumstance: COVID-19 national emergency Identification of patient Elizabeth Espinoza - 2 person identifier Risks: Risks, benefits, limitations of telephone visit explained Patient location: home This provider location: [facility]      Chief Complaint  Patient presents with   Follow-up    PFT 7/27--c/o sob with stairs and throat clearing mainly in the morning. stopped Esbriet on 08/12/2019.   Follow-up interstitial lung disease with CT scan October 2020 being indeterminate versus alternate diagnosis.  History of gardening.  Trace autoimmune ANA positive.  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 -presents for this telephone visit for IPF.  On this telephone visit she was identified with 2 person identifier.  Risks, benefits and limitations of telephone visit explained.  After last visit her GI symptoms worsen.  She did see Dr. Young Berry gastroenterologist.  She underwent endoscopy.  Dr. Collene Mares did send the results to me I do not have this.  I remember Dr. Collene Mares calling me and discussing  patient's GI side effects.  These were from pirfenidone so on August 12, 2019 based on my advise she start pirfenidone.  Since then her symptoms of GI nature have resolved.  Her respiratory symptoms continue to be stable.  These are all documented below.  At this point in time she feels that her quality of life is very important.  She does not want to go through the kind of GI side effect she went through with pirfenidone.  She would rather deal with the disease.  She is aware that nintedanib is a standard of care therapeutic option.  She is also aware after discussion the clinical trials as a care option particularly in the past to discovering new therapies that do not have this GI side effects that pirfenidone poses.  She had a lot of questions about Covid vaccine.  She is fully vaccinated.  She is worried about her grandson was refusing the vaccine.      OV 09/29/2019   Subjective:  Patient ID: Elizabeth Espinoza, female , DOB: 04/28/1940, age 62 y.o. years. , MRN: [id number],  ADDRESS: [place] [address] PCP  Binnie Rail, MD Providers : Treatment Team:  Attending Provider: Brand Males, MD   Chief Complaint  Patient presents with   Follow-up    LFT today?  pulmonary rehab or exercises.  inhaler trial, felt better after Advair.   Follow-up interstitial lung disease/IPF with CT scan October 2020 being indeterminate versus alternate diagnosis.  History of gardening.  Trace autoimmune ANA positive.  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 Date of test is 02/05/2019. 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  -Date of giving diagnosis is March 16, 2019  - MDD 05/05/19  - esbriet since 05/06/19- stopped 08/12/19 due to severe GI side effect    HPI Elizabeth Espinoza 81 y.o. -    presesnts  With her husband for IPF followup.  Overall she is doing  well.  She is not taking pirfenidone since July 2021.  After that she has gained weight.  All her GI symptoms have resolved.  She goes for daily walks.  She says when she reaches the top of the hill sometimes she desaturates to 88% but not always.  She monitors this closely.  She is asking for other exercises that can improve her endurance and lung function.  We discussed that exercises do not improve lung function.  She is willing to participate in pulmonary rehabilitation.  We discussed the alternative of taking nintedanib but because of the severe GI side effects that she had with Esbriet she is nervous about starting nintedanib and does not want to.  I introduced her to the concept of clinical trials as a care option.  At this point in time she wants to process this information.  We did discuss the fact that IPF is a progressive disease and antifibrotic's are indicated early in the course of the disease.  She prefers to have supportive care approach.  She wants to focus on strengthening her overall physical shape.   OV 06/30/2021 -standard of care visit in this research patient.  Subjective:  Patient ID: Elizabeth Espinoza, female , DOB: 1940/04/16 , age 59 y.o. , MRN: [id number] , ADDRESS: [place] [id number] PCP Binnie Rail, MD Patient Care Team: Binnie Rail, MD as PCP - General (Internal Medicine) Stanford Breed Denice Bors, MD as Consulting Physician (Cardiology) Elizabeth Gobble, MD as Consulting Physician (Pulmonary Disease) Charlton Haws, [facility] as Pharmacist (Pharmacist) Melida Quitter, MD as Consulting Physician (Otolaryngology) [place], P.A. as Consulting Physician (Ophthalmology)  This Provider for this visit: Treatment Team:  Attending Provider: Brand Males, MD    06/30/2021 -   Chief Complaint  Patient presents with   Follow-up    Pt recently had a CT of her sinuses and is here to discuss the results.  Pt states she has not been  feeling well and states she is still having problems with her sinuses. States her breathing has been doing okay.    Follow-up interstitial lung disease/IPF with CT scan October 2020 being indeterminate versus alternate diagnosis.  History of gardening.  Trace autoimmune ANA positive.  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 Date of test is 02/05/2019. 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  -Date of giving diagnosis is March 16, 2019  - MDD 05/05/19  - esbriet since 05/06/19- stopped 08/12/19 due to severe GI side effect   IPF patient on  -No approved antifibrotic based on personal choice and side effect  profile -On inhaled treprostinil versus placebo [[place]] -phase 3 trial  -Consent February 2023  -Randomization April 06 2021  HPI Elizabeth Espinoza 81 y.o. -on April 19, 2021 she did have some side effects from inhaled medical product.  After that we reduce the dose and slowly escalated back and then she was tolerating it fine.  On 05/22/2021 she was prescribed antibiotics for some sinus issues she did have week 8 study visit on 06/01/2021 and was doing okay.  But then on 06/20/2021 she started complaining of headache and sinus congestion and coughing and blowing yellowish mucus.  At this point in time she was taking inhaled treprostinil versus placebo investigational medical product at 4 times daily each time 12 puffs.  We told her to reduce it to 9 puffs 4 times daily.  She tells me that perhaps she is marginally better after reducing the dose but she still having headaches.  She feels like her head is full particularly in the top part.  She also has significant cough.  She says during and after she takes the inhaled medical investigational product her cough score is 9 out of 10.  Rest of the day it is around 3 out of 10.  She coughs so much that she sometimes brings out mild  yellow mucus that is like glue.  There is no diarrhea fatigue or weight loss or syncope or hemoptysis.  Definitely all the symptoms only after starting the research protocol.  She also some sore throat.  She has tried some remedial measures but these have not helped.  These include yogurt and ginger ale  For the dyspnea itself she feels stable.  She also tells me that she is able to walk a mile.  She is able to outplace her husband.  She feels her husband's declining health.  She says when she walks a mile she puts a finger probe after she rests and the pulse ox is 94%.  She is worried she might desaturate below 88%.  She does not have a forehead pulse oximeter.  She says the finger pulse ox meter does not stay on while she is walking.  I gave her a contact of the patient support group leader to figure out a way to get a forehead probe.  Also showed her various finger proximal she can get that can stay on her finger while she exercises.  Told her the safety limit is greater than 88%.  Symptom scores below show that her symptoms are better than 2 years ago slightly.  This is from a respiratory standpoint with a cough is definitely worse after she went on the study.  And the headache is new.  She had a CT scan sinus that shows chronic findings.   SYMPTOM SCALE - ILD     Inhaled Tyvaso v Placebo.  O2 use 10/08/2018  11/07/2018  12/22/2018  06/30/21   Shortness of Breath 0 -> 5 scale with 5 being worst (score 6 If unable to do)  ra ra  At rest 0 0 0 0  Simple tasks - showers, clothes change, eating, shaving 1 1 0 1  Household (dishes, doing bed, laundry) _0 Shopping 2 2 0.5 2  Walking level at own pace 2 1 0.5 1  Walking up Stairs _1 2.5  Total (40 - 48) Dyspnea Score _2 9.5  How bad is your cough? 1 3 2.5 5  How bad is your fatigue 2 2.5 1.5  am, 4.5 pm 4  nausea    1  vomit    0  diarrhea    0  anxiet    00  depression    0  pain    Headacne,, sinus complaints   Simple  office walk 185 feet x  3 laps goal with forehead probe 06/30/2021    O2 used ra   Number laps completed 3   Comments about pace Fast pace   Resting Pulse Ox/HR 96% and 74/min   Final Pulse Ox/HR 96% and 117/min   Desaturated </= 88% no   Desaturated <= 3% points n   Got Tachycardic >/= 90/min yes   Symptoms at end of test No complaints   Miscellaneous comments Sx     CT Chest data  CT MAXILLOFACIAL WO CONTRAST  Result Date: 06/29/2021 CLINICAL DATA:  Chronic sinusitis. Chronic cough. Sinus pain and pressure. Frontotemporal pain and maxillary pain worse on the right. EXAM: CT MAXILLOFACIAL WITHOUT CONTRAST TECHNIQUE: Multidetector CT images of the paranasal sinuses were obtained using the standard protocol without intravenous contrast. RADIATION DOSE REDUCTION: This exam was performed according to the departmental dose-optimization program which includes automated exposure control, adjustment of the mA and/or kV according to patient size and/or use of iterative reconstruction technique. COMPARISON:  None FINDINGS: Paranasal sinuses: Frontal: Normally aerated. Patent frontal sinus drainage pathways. Ethmoid: Normally aerated. Maxillary: Few small retention cysts in the right maxillary sinus. Mild mucosal thickening or tiny amount of fluid at the maxillary sinus floors. Sphenoid: Normally aerated. Patent sphenoethmoidal recesses. Right ostiomeatal unit: Previous functional endoscopic sinus surgery on the right. Wide airspace communication. Left ostiomeatal unit: Previous functional endoscopic sinus surgery on the left. Wide airspace communication. Nasal passages: Patent. Intact nasal septum is midline. Anatomy: No pneumatization superior to anterior ethmoid notches. Symmetric and intact olfactory grooves and fovea ethmoidalis, Keros II (4-57m). Sellar sphenoid pneumatization pattern. No dehiscence of carotid or optic canals. No onodi cell. Other: None IMPRESSION: Previous functional endoscopic sinus  surgery on both sides. Wide airspace communication. Few small retention cysts in the right maxillary sinus. Mild mucosal thickening or tiny amount of fluid at the maxillary sinus floors. Electronically Signed   By: MNelson ChimesM.D.   On: 06/29/2021 11:32      PFT     Latest Ref Rng & Units 03/04/2020    1:00 PM 09/15/2019    5:13 PM 03/16/2019    8:42 AM 01/29/2018    2:31 PM  PFT Results  FVC-Pre L 3.07  2.92  2.91  2.97   FVC-Predicted Pre % 104  99  97  98   FVC-Post L    2.89   FVC-Predicted Post %    95   Pre FEV1/FVC % % 78  80  79  81   Post FEV1/FCV % %    83   FEV1-Pre L 2.39  2.34  2.29  2.40   FEV1-Predicted Pre % 108  106  102  105   FEV1-Post L    2.39   DLCO uncorrected ml/min/mmHg 12.94  12.84  12.32  14.80   DLCO UNC% % 63  63  60  54   DLCO corrected ml/min/mmHg 12.94  13.26  12.91    DLCO COR %Predicted % 63  65  63    DLVA Predicted % 70  71  73  68        has a past medical history of Allergic rhinitis, GERD (gastroesophageal reflux disease), Glaucoma,  Hiatal hernia, Hyperlipidemia, Hypothyroidism, ILD (interstitial lung disease) ([facility]), Lichen planopilaris, MVP (mitral valve prolapse), Restless legs, and Urticaria.   reports that she has never smoked. She has never used smokeless tobacco.  Past Surgical History:  Procedure Laterality Date   APPENDECTOMY     with [place]   negative   COLONOSCOPY  2005   Dr Olevia Perches   COLONOSCOPY  01/2013   Dr Elizabeth Mares   esophageal dilation  07/04/2011   Dr Estrella Myrtle SIGMOIDOSCOPY  1999    Dr Olevia Perches   KNEE ARTHROSCOPY  2001, 2005   Dr Percell Miller   bilat   TOTAL ABDOMINAL HYSTERECTOMY     for Endometriosis; no BSO, Dr Colin Ina   UPPER GI ENDOSCOPY  01/2013   Dr Elizabeth Mares   VIDEO BRONCHOSCOPY Bilateral 02/05/2019   Procedure: VIDEO BRONCHOSCOPY WITH FLUORO;  Surgeon: Brand Males, MD;  Location: [facility];  Service: Endoscopy;  Laterality: Bilateral;    Allergies  Allergen Reactions    Caffeine     palpitations   Chlorpheniramine-Pseudoeph     palpitations   Maxifed     palpitations   Other Other (See Comments)   Pirfenidone Nausea Only    Esbriet Causes nausea and weakness    Immunization History  Administered Date(s) Administered   Fluad Quad(high Dose 65+) 11/15/2020   Influenza Split 11/07/2010   Influenza Whole 10/30/2009, 11/27/2011   Influenza, High Dose Seasonal PF 10/27/2012, 11/05/2013, 10/01/2017, 09/11/2018, 11/23/2019   Influenza-Unspecified 11/24/2014, 10/07/2015, 10/08/2016   PFIZER(Purple Top)SARS-COV-2 Vaccination 03/14/2019, 04/06/2019, 10/27/2019   Pneumococcal Conjugate-13 10/07/2015   Pneumococcal Polysaccharide-23 07/02/2017   Tdap 02/11/2012   Zoster Recombinat (Shingrix) 10/01/2017, 12/03/2017   Zoster, Live 01/09/2012    Family History  Problem Relation Age of Onset   Lung cancer Father        Asbestos with mesothelioma   Heart disease Paternal Grandmother        ? etiology   Diabetes Paternal Grandmother    Diabetes Brother    Uterine cancer Paternal Aunt    Stroke Paternal Grandfather        > 69   Heart attack Brother 21   Allergies Sister    Aneurysm Mother        congenital   Stroke Brother        two strokes   Barrett's esophagus Brother      Current Outpatient Medications:    alclomethasone (ACLOVATE) 0.05 % ointment, Apply topically., Disp: , Rfl:    aspirin EC 81 MG tablet, Take 1 tablet (81 mg total) by mouth daily., Disp: 90 tablet, Rfl: 3   benzonatate (TESSALON) 200 MG capsule, Take 1 capsule (200 mg total) by mouth 3 (three) times daily as needed for cough., Disp: 30 capsule, Rfl: 1   betamethasone, augmented, (DIPROLENE) 0.05 % lotion, Apply topically., Disp: , Rfl:    cholecalciferol (VITAMIN D3) 25 MCG (1000 UT) tablet, Take 1,000 Units by mouth daily., Disp: , Rfl:    clobetasol (TEMOVATE) 0.05 % external solution, SMARTSIG:1 Milliliter(s) Topical Twice Daily, Disp: , Rfl:    esomeprazole (NEXIUM)  40 MG capsule, Take 40 mg by mouth every morning., Disp: , Rfl:    famotidine (PEPCID) 40 MG tablet, Take 1 tablet (40 mg total) by mouth daily., Disp: 90 tablet, Rfl: 1   Fexofenadine HCl (ALLEGRA PO), Take by mouth., Disp: , Rfl:    fluticasone (FLONASE) 50 MCG/ACT nasal spray, Place 1 spray into both nostrils daily  as needed for allergies or rhinitis., Disp: , Rfl:    gabapentin (NEURONTIN) 100 MG capsule, Take 1-2 capsules (100-200 mg total) by mouth at bedtime., Disp: 60 capsule, Rfl: 3   hydroxychloroquine (PLAQUENIL) 200 MG tablet, Take 200 mg by mouth 2 (two) times daily., Disp: , Rfl:    magnesium oxide (MAG-OX) 400 MG tablet, Take 400 mg by mouth daily., Disp: , Rfl:    Multiple Vitamins-Minerals (ZINC PO), Take 1 tablet by mouth., Disp: , Rfl:    ofloxacin (OCUFLOX) 0.3 % ophthalmic solution, Place 1 drop into the left eye 4 (four) times daily., Disp: , Rfl:    pravastatin (PRAVACHOL) 40 MG tablet, TAKE 1 TABLET BY MOUTH  DAILY, Disp: 100 tablet, Rfl: 2   Probiotic Product (PROBIOTIC ADVANCED) CAPS, Take by mouth., Disp: , Rfl:    rOPINIRole (REQUIP) 0.5 MG tablet, TAKE 2 TABLETS BY MOUTH  AFTER DINNER EACH NIGHT., Disp: 180 tablet, Rfl: 3   SYNTHROID 88 MCG tablet, Take by mouth., Disp: , Rfl:    triamcinolone cream (KENALOG) 0.1 %, Apply 1 application topically 2 (two) times daily., Disp: , Rfl:    vitamin B-12 (CYANOCOBALAMIN) 500 MCG tablet, Take 500 mcg by mouth daily., Disp: , Rfl:       Objective:   Vitals:   06/30/21 1154  BP: 132/76  Pulse: 73  Temp: 97.7 F (36.5 C)  TempSrc: Oral  SpO2: 97%  Weight: 139 lb 3.2 oz (63.1 kg)  Height: _0  (1.676 m)    Estimated body mass index is 22.47 kg/m as calculated from the following:   Height as of this encounter: _1  (1.676 m).   Weight as of this encounter: 139 lb 3.2 oz (63.1 kg).  _2 @  Filed Weights   06/30/21 1154  Weight: 139 lb 3.2 oz (63.1 kg)     Physical Exam    General: No distress.  Looks well Neuro: Alert and Oriented x 3. GCS 15. Speech normal Psych: Pleasant Resp:  Barrel Chest - no.  Wheeze - no, Crackles - very mild posibly right base, No overt respiratory distress CVS: Normal heart sounds. Murmurs - no Ext: Stigmata of Connective Tissue Disease - no HEENT: Normal upper airway. PEERL +. No post nasal drip        Assessment:       ICD-10-CM   1. IPF (idiopathic pulmonary fibrosis) ([facility])  X91.478          Plan:     Patient Instructions     ICD-10-CM   1. IPF (idiopathic pulmonary fibrosis) ([facility])  J84.112      Sinus,  sore throat, cough al seem relatd to research drug  Plan  - reduce research nebulizert to 6 times eacch time - 4 times daily  - try taking peanut butter before nebulizer - do salne nasal spray daily twice if not doing already - continue other medication - wil discuss with Reba the CRC and the study monitor to see how we can minimize symptoms - try to get forehead probe (call Marlane Mingle) for pulse ox monitoring with walks  - keep pulse ox > 88%  [place]  -at research for now  ( Level 05 visit: Estb 40-54 min n  visit type: on-site physical face to visit  in total care time and counseling or/and coordination of care by this undersigned MD - Dr Brand Males. This includes one or more of the following on this same day 06/30/2021: pre-charting, chart review, note writing, documentation  discussion of test results, diagnostic or treatment recommendations, prognosis, risks and benefits of management options, instructions, education, compliance or risk-factor reduction. It excludes time spent by the [facility] or office staff in the care of the patient. Actual time 40 min)   SIGNATURE    Dr. Brand Males, M.D., F.C.C.P,  Pulmonary and Critical Care Medicine Staff Physician, [facility] Director - Interstitial Lung Disease  Program  Pulmonary [place] [place] at [place], [place],  [id number]  Pager: [phone], If no answer or between  15:00h - 7:00h: call [phone] Telephone: [phone]  4:24 PM 06/30/2021

## 2021-06-30 NOTE — Patient Instructions (Addendum)
ICD-10-CM   1. IPF (idiopathic pulmonary fibrosis) (H[facility])  J84.112      Sinus,  sore throat, cough al seem relatd to research drug  Plan  - reduce research nebulizert to 6 times eacch time - 4 times daily  - try taking peanut butter before nebulizer - do salne nasal spray daily twice if not doing already - continue other medication - wil discuss with Reba the CRC and the study monitor to see how we can minimize symptoms - try to get forehead probe (call Marlane Mingle) for pulse ox monitoring with walks  - keep pulse ox > 88%  [place]  -at research for now

## 2021-07-10 ENCOUNTER — Other Ambulatory Visit (INDEPENDENT_AMBULATORY_CARE_PROVIDER_SITE_OTHER): Payer: Medicare Other

## 2021-07-10 DIAGNOSIS — E039 Hypothyroidism, unspecified: Secondary | ICD-10-CM

## 2021-07-10 DIAGNOSIS — E782 Mixed hyperlipidemia: Secondary | ICD-10-CM

## 2021-07-10 DIAGNOSIS — R7303 Prediabetes: Secondary | ICD-10-CM

## 2021-07-10 LAB — COMPREHENSIVE METABOLIC PANEL
ALT: 13 U/L (ref 0–35)
AST: 17 U/L (ref 0–37)
Albumin: 4.1 g/dL (ref 3.5–5.2)
Alkaline Phosphatase: 71 U/L (ref 39–117)
BUN: 12 mg/dL (ref 6–23)
CO2: 26 mEq/L (ref 19–32)
Calcium: 9.3 mg/dL (ref 8.4–10.5)
Chloride: 104 mEq/L (ref 96–112)
Creatinine, Ser: 0.79 mg/dL (ref 0.40–1.20)
GFR: 70.75 mL/min (ref >60.00)
Glucose, Bld: 102 mg/dL — ABNORMAL HIGH (ref 70–99)
Potassium: 4.2 mEq/L (ref 3.5–5.1)
Sodium: 142 mEq/L (ref 135–145)
Total Bilirubin: 0.7 mg/dL (ref 0.2–1.2)
Total Protein: 6.7 g/dL (ref 6.0–8.3)

## 2021-07-10 LAB — TSH: TSH: 0.19 u[IU]/mL — ABNORMAL LOW (ref 0.35–5.50)

## 2021-07-10 LAB — CBC WITH DIFFERENTIAL/PLATELET
Basophils Absolute: 0.1 10*3/uL (ref 0.0–0.1)
Basophils Relative: 1.4 % (ref 0.0–3.0)
Eosinophils Absolute: 0.5 10*3/uL (ref 0.0–0.7)
Eosinophils Relative: 6 % — ABNORMAL HIGH (ref 0.0–5.0)
HCT: 39.9 % (ref 36.0–46.0)
Hemoglobin: 13.1 g/dL (ref 12.0–15.0)
Lymphocytes Relative: 36.7 % (ref 12.0–46.0)
Lymphs Abs: 2.8 10*3/uL (ref 0.7–4.0)
MCHC: 32.9 g/dL (ref 30.0–36.0)
MCV: 89.8 fl (ref 78.0–100.0)
Monocytes Absolute: 0.8 10*3/uL (ref 0.1–1.0)
Monocytes Relative: 9.7 % (ref 3.0–12.0)
Neutro Abs: 3.6 10*3/uL (ref 1.4–7.7)
Neutrophils Relative %: 46.2 % (ref 43.0–77.0)
Platelets: 241 10*3/uL (ref 150.0–400.0)
RBC: 4.44 Mil/uL (ref 3.87–5.11)
RDW: 15.4 % (ref 11.5–15.5)
WBC: 7.7 10*3/uL (ref 4.0–10.5)

## 2021-07-10 LAB — LIPID PANEL
Cholesterol: 122 mg/dL (ref 0–200)
HDL: 53 mg/dL (ref >39.00)
LDL Cholesterol: 52 mg/dL (ref 0–99)
NonHDL: 69.26
Total CHOL/HDL Ratio: 2
Triglycerides: 85 mg/dL (ref 0.0–149.0)
VLDL: 17 mg/dL (ref 0.0–40.0)

## 2021-07-10 LAB — HEMOGLOBIN A1C: Hgb A1c MFr Bld: 5.9 % (ref 4.6–6.5)

## 2021-07-11 ENCOUNTER — Encounter: Payer: Self-pay | Admitting: Internal Medicine

## 2021-07-11 NOTE — Progress Notes (Unsigned)
Subjective:    Patient ID: Elizabeth Espinoza, female    DOB: Jul 31, 1940, 81 y.o.   MRN: [id number]      HPI Elizabeth Espinoza is here for a Physical exam.   Labs done previously.  On a medication for her lungs and having a lot of side effects.  Fatigue and increased mucus in her throat that is very thick and she can not clear.  She shakes a lot which may also be from the medication.  She wonders if her new lung medication is interfering with her thyroid medication.   Medications and allergies reviewed with patient and updated if appropriate.  Current Outpatient Medications on File Prior to Visit  Medication Sig Dispense Refill   alclomethasone (ACLOVATE) 0.05 % ointment Apply topically.     aspirin EC 81 MG tablet Take 1 tablet (81 mg total) by mouth daily. 90 tablet 3   benzonatate (TESSALON) 200 MG capsule Take 1 capsule (200 mg total) by mouth 3 (three) times daily as needed for cough. 30 capsule 1   betamethasone, augmented, (DIPROLENE) 0.05 % lotion Apply topically.     cholecalciferol (VITAMIN D3) 25 MCG (1000 UT) tablet Take 1,000 Units by mouth daily.     clobetasol (TEMOVATE) 0.05 % external solution SMARTSIG:1 Milliliter(s) Topical Twice Daily     esomeprazole (NEXIUM) 40 MG capsule Take 40 mg by mouth every morning.     famotidine (PEPCID) 40 MG tablet Take 1 tablet (40 mg total) by mouth daily. 90 tablet 1   Fexofenadine HCl (ALLEGRA PO) Take by mouth.     fluticasone (FLONASE) 50 MCG/ACT nasal spray Place 1 spray into both nostrils daily as needed for allergies or rhinitis.     gabapentin (NEURONTIN) 100 MG capsule Take 1-2 capsules (100-200 mg total) by mouth at bedtime. 60 capsule 3   hydroxychloroquine (PLAQUENIL) 200 MG tablet Take 200 mg by mouth 2 (two) times daily.     magnesium oxide (MAG-OX) 400 MG tablet Take 400 mg by mouth daily.     Multiple Vitamins-Minerals (ZINC PO) Take 1 tablet by mouth.     ofloxacin (OCUFLOX) 0.3 % ophthalmic solution Place 1 drop into the  left eye 4 (four) times daily.     pravastatin (PRAVACHOL) 40 MG tablet TAKE 1 TABLET BY MOUTH  DAILY 100 tablet 2   Probiotic Product (PROBIOTIC ADVANCED) CAPS Take by mouth.     rOPINIRole (REQUIP) 0.5 MG tablet TAKE 2 TABLETS BY MOUTH  AFTER DINNER EACH NIGHT. 180 tablet 3   triamcinolone cream (KENALOG) 0.1 % Apply 1 application topically 2 (two) times daily.     vitamin B-12 (CYANOCOBALAMIN) 500 MCG tablet Take 500 mcg by mouth daily.     [DISCONTINUED] loratadine (CLARITIN) 10 MG tablet Take 10 mg by mouth daily as needed.       [DISCONTINUED] omeprazole (PRILOSEC) 40 MG capsule Take 1 capsule (40 mg total) by mouth daily. 90 capsule 0   [DISCONTINUED] SYNTHROID 88 MCG tablet Take by mouth.     No current facility-administered medications on file prior to visit.    Review of Systems  Constitutional:  Negative for fever.       Variations in temperature - gets cold- then hot  Eyes:  Negative for visual disturbance.  Respiratory:  Positive for cough, chest tightness (across chest with exercise) and shortness of breath (wtih walking up stairs/incline). Negative for wheezing.   Cardiovascular:  Negative for chest pain, palpitations and leg swelling.  Gastrointestinal:  Positive  for nausea. Negative for abdominal pain, blood in stool, constipation and diarrhea.       No gerd  Genitourinary:  Negative for dysuria.  Musculoskeletal:  Negative for arthralgias and back pain.       Leg cramping  Skin:  Negative for rash.  Neurological:  Positive for light-headedness. Negative for headaches.  Psychiatric/Behavioral:  Negative for dysphoric mood. The patient is not nervous/anxious.        Objective:   Vitals:   07/12/21 0906  BP: 122/70  Pulse: 74  Temp: 98 F (36.7 C)  SpO2: 94%   Filed Weights   07/12/21 0906  Weight: 137 lb 8 oz (62.4 kg)   Body mass index is 22.19 kg/m.  BP Readings from Last 3 Encounters:  07/12/21 122/70  06/30/21 132/76  03/21/21 124/80    Wt  Readings from Last 3 Encounters:  07/12/21 137 lb 8 oz (62.4 kg)  06/30/21 139 lb 3.2 oz (63.1 kg)  03/21/21 141 lb 9.6 oz (64.2 kg)       Physical Exam Constitutional: She appears well-developed and well-nourished. No distress.  HENT:  Head: Normocephalic and atraumatic.  Right Ear: External ear normal. Normal ear canal and TM Left Ear: External ear normal.  Normal ear canal and TM Mouth/Throat: Oropharynx is clear and moist.  Eyes: Conjunctivae normal.  Neck: Neck supple. No tracheal deviation present. No thyromegaly present.  No carotid bruit  Cardiovascular: Normal rate, regular rhythm and normal heart sounds.   No murmur heard.  No edema. Pulmonary/Chest: Effort normal and breath sounds normal. No respiratory distress. She has no wheezes. She has bibasilar crackles  Breast: deferred   Abdominal: Soft. She exhibits no distension. There is no tenderness.  Lymphadenopathy: She has no cervical adenopathy.  Skin: Skin is warm and dry. She is not diaphoretic.  Psychiatric: She has a normal mood and affect. Her behavior is normal.     Lab Results  Component Value Date   WBC 7.7 07/10/2021   HGB 13.1 07/10/2021   HCT 39.9 07/10/2021   PLT 241.0 07/10/2021   GLUCOSE 102 (H) 07/10/2021   CHOL 122 07/10/2021   TRIG 85.0 07/10/2021   HDL 53.00 07/10/2021   LDLCALC 52 07/10/2021   ALT 13 07/10/2021   AST 17 07/10/2021   NA 142 07/10/2021   K 4.2 07/10/2021   CL 104 07/10/2021   CREATININE 0.79 07/10/2021   BUN 12 07/10/2021   CO2 26 07/10/2021   TSH 0.19 (L) 07/10/2021   INR 1.0 01/29/2019   HGBA1C 5.9 07/10/2021         Assessment & Plan:   Physical exam: Screening blood work  reviewed Exercise  getting 10,000 steps most days Weight  normal Substance abuse  none   Reviewed recommended immunizations.   Health Maintenance  Topic Date Due   COVID-19 Vaccine (4 - Booster for Pfizer series) 07/28/2021 (Originally 12/22/2019)   INFLUENZA VACCINE  08/22/2021    DEXA SCAN  11/05/2021   TETANUS/TDAP  02/10/2022   Pneumonia Vaccine 66+ Years old  Completed   Zoster Vaccines- Shingrix  Completed   HPV VACCINES  Aged Out          See Problem List for Assessment and Plan of chronic medical problems.

## 2021-07-11 NOTE — Patient Instructions (Addendum)
Medications changes include :   none     Return in about 1 year (around 07/13/2022) for Physical Exam.   Health Maintenance, Female Adopting a healthy lifestyle and getting preventive care are important in promoting health and wellness. Ask your health care provider about: The right schedule for you to have regular tests and exams. Things you can do on your own to prevent diseases and keep yourself healthy. What should I know about diet, weight, and exercise? Eat a healthy diet  Eat a diet that includes plenty of vegetables, fruits, low-fat dairy products, and lean protein. Do not eat a lot of foods that are high in solid fats, added sugars, or sodium. Maintain a healthy weight Body mass index (BMI) is used to identify weight problems. It estimates body fat based on height and weight. Your health care provider can help determine your BMI and help you achieve or maintain a healthy weight. Get regular exercise Get regular exercise. This is one of the most important things you can do for your health. Most adults should: Exercise for at least 150 minutes each week. The exercise should increase your heart rate and make you sweat (moderate-intensity exercise). Do strengthening exercises at least twice a week. This is in addition to the moderate-intensity exercise. Spend less time sitting. Even light physical activity can be beneficial. Watch cholesterol and blood lipids Have your blood tested for lipids and cholesterol at 80 years of age, then have this test every 5 years. Have your cholesterol levels checked more often if: Your lipid or cholesterol levels are high. You are older than 80 years of age. You are at high risk for heart disease. What should I know about cancer screening? Depending on your health history and family history, you may need to have cancer screening at various ages. This may include screening for: Breast cancer. Cervical cancer. Colorectal cancer. Skin  cancer. Lung cancer. What should I know about heart disease, diabetes, and high blood pressure? Blood pressure and heart disease High blood pressure causes heart disease and increases the risk of stroke. This is more likely to develop in people who have high blood pressure readings or are overweight. Have your blood pressure checked: Every 3-5 years if you are 78-70 years of age. Every year if you are 88 years old or older. Diabetes Have regular diabetes screenings. This checks your fasting blood sugar level. Have the screening done: Once every three years after age 54 if you are at a normal weight and have a low risk for diabetes. More often and at a younger age if you are overweight or have a high risk for diabetes. What should I know about preventing infection? Hepatitis B If you have a higher risk for hepatitis B, you should be screened for this virus. Talk with your health care provider to find out if you are at risk for hepatitis B infection. Hepatitis C Testing is recommended for: Everyone born from [birth year] through 1964. Anyone with known risk factors for hepatitis C. Sexually transmitted infections (STIs) Get screened for STIs, including gonorrhea and chlamydia, if: You are sexually active and are younger than 80 years of age. You are older than 80 years of age and your health care provider tells you that you are at risk for this type of infection. Your sexual activity has changed since you were last screened, and you are at increased risk for chlamydia or gonorrhea. Ask your health care provider if you are at risk. Ask  your health care provider about whether you are at high risk for HIV. Your health care provider may recommend a prescription medicine to help prevent HIV infection. If you choose to take medicine to prevent HIV, you should first get tested for HIV. You should then be tested every 3 months for as long as you are taking the medicine. Pregnancy If you are about to stop  having your period (premenopausal) and you may become pregnant, seek counseling before you get pregnant. Take 400 to 800 micrograms (mcg) of folic acid every day if you become pregnant. Ask for birth control (contraception) if you want to prevent pregnancy. Osteoporosis and menopause Osteoporosis is a disease in which the bones lose minerals and strength with aging. This can result in bone fractures. If you are 69 years old or older, or if you are at risk for osteoporosis and fractures, ask your health care provider if you should: Be screened for bone loss. Take a calcium or vitamin D supplement to lower your risk of fractures. Be given hormone replacement therapy (HRT) to treat symptoms of menopause. Follow these instructions at home: Alcohol use Do not drink alcohol if: Your health care provider tells you not to drink. You are pregnant, may be pregnant, or are planning to become pregnant. If you drink alcohol: Limit how much you have to: 0-1 drink a day. Know how much alcohol is in your drink. In the [place] one drink equals one 12 oz bottle of beer (355 mL), one 5 oz glass of wine (148 mL), or one 1 oz glass of hard liquor (44 mL). Lifestyle Do not use any products that contain nicotine or tobacco. These products include cigarettes, chewing tobacco, and vaping devices, such as e-cigarettes. If you need help quitting, ask your health care provider. Do not use street drugs. Do not share needles. Ask your health care provider for help if you need support or information about quitting drugs. General instructions Schedule regular health, dental, and eye exams. Stay current with your vaccines. Tell your health care provider if: You often feel depressed. You have ever been abused or do not feel safe at home. Summary Adopting a healthy lifestyle and getting preventive care are important in promoting health and wellness. Follow your health care provider's instructions about healthy diet,  exercising, and getting tested or screened for diseases. Follow your health care provider's instructions on monitoring your cholesterol and blood pressure. This information is not intended to replace advice given to you by your health care provider. Make sure you discuss any questions you have with your health care provider. Document Revised: 05/30/2020 Document Reviewed: 05/30/2020 Elsevier Patient Education  [place].

## 2021-07-12 ENCOUNTER — Encounter: Payer: Self-pay | Admitting: Internal Medicine

## 2021-07-12 ENCOUNTER — Ambulatory Visit (INDEPENDENT_AMBULATORY_CARE_PROVIDER_SITE_OTHER): Payer: Medicare Other | Admitting: Internal Medicine

## 2021-07-12 VITALS — BP 122/70 | HR 74 | Temp 98.0°F | Ht 66.0 in | Wt 137.5 lb

## 2021-07-12 DIAGNOSIS — E782 Mixed hyperlipidemia: Secondary | ICD-10-CM | POA: Diagnosis not present

## 2021-07-12 DIAGNOSIS — R7303 Prediabetes: Secondary | ICD-10-CM | POA: Diagnosis not present

## 2021-07-12 DIAGNOSIS — L661 Lichen planopilaris, unspecified: Secondary | ICD-10-CM

## 2021-07-12 DIAGNOSIS — E039 Hypothyroidism, unspecified: Secondary | ICD-10-CM | POA: Diagnosis not present

## 2021-07-12 DIAGNOSIS — G2581 Restless legs syndrome: Secondary | ICD-10-CM

## 2021-07-12 DIAGNOSIS — T7840XA Allergy, unspecified, initial encounter: Secondary | ICD-10-CM | POA: Insufficient documentation

## 2021-07-12 DIAGNOSIS — I700 Atherosclerosis of aorta: Secondary | ICD-10-CM

## 2021-07-12 DIAGNOSIS — H9190 Unspecified hearing loss, unspecified ear: Secondary | ICD-10-CM | POA: Insufficient documentation

## 2021-07-12 DIAGNOSIS — Z0000 Encounter for general adult medical examination without abnormal findings: Secondary | ICD-10-CM | POA: Diagnosis not present

## 2021-07-12 DIAGNOSIS — K219 Gastro-esophageal reflux disease without esophagitis: Secondary | ICD-10-CM

## 2021-07-12 NOTE — Assessment & Plan Note (Signed)
Chronic Lipids well controlled Continue pravastatin 40 mg daily Continue regular exercise and healthy diet

## 2021-07-12 NOTE — Assessment & Plan Note (Signed)
Chronic A1c 5.9% Continue regular exercise, low sugar/carbohydrate diet

## 2021-07-12 NOTE — Assessment & Plan Note (Signed)
Management per dermatology On Plaquenil

## 2021-07-12 NOTE — Assessment & Plan Note (Signed)
Chronic Continue Requip 1 mg nightly Gabapentin 100-200 mg at night

## 2021-07-12 NOTE — Assessment & Plan Note (Signed)
Chronic GERD controlled Continue Nexium 40 mg daily, Pepcid 40 mg daily as needed

## 2021-07-12 NOTE — Assessment & Plan Note (Addendum)
Chronic Continue pravastatin 40 mg daily Continue aspirin 81 mg daily Continue healthy diet and regular exercise Lipids well controlled Lab Results  Component Value Date   LDLCALC 52 07/10/2021

## 2021-07-12 NOTE — Assessment & Plan Note (Addendum)
Chronic  Feeling shaky and tired - ? Related to thyroid or more likely related to new medication she is taking for interstitial lung disease which may also be interacting with the Synthroid Tsh low She will contact Dr Cruzita Lederer for adjustment of medication

## 2021-07-13 MED ORDER — SYNTHROID 88 MCG PO TABS
ORAL_TABLET | ORAL | Status: DC
Start: 2021-07-13 — End: 2021-08-28

## 2021-07-18 ENCOUNTER — Telehealth: Payer: Self-pay | Admitting: *Deleted

## 2021-07-19 NOTE — Telephone Encounter (Signed)
PI OVERSIGHT ATTESTATION  I the Principal Investigator (PI) for the above mentioned study attest that I reviewed the mentioned  clinical research coordinator  notes on research subject  Elizabeth Espinoza  born [birth year] . I  agree with the findings mentioned above   Above decision were taken with my direct oversight after CRC and PI (me) met over secure video yesterday to discuss patient AEs.   Dr.Imunique Samad Chase Caller, MD Pulmonary and [place] Staff Physician [facility] Pager: [phone], If no answer or between  15:00h - 7:00h: call [phone]  07/19/2021 1:29 PM

## 2021-07-19 NOTE — Telephone Encounter (Signed)
This phone call is Week 15 Telephone Visit for the [facility] RIN-PF-301 protocol.   On 27Jun2023, I reviewed this subjects adverse events with Dr. Chase Caller, PI and it was Dr. Golden Pop recommendation that the subject stop taking the study drug for the next 2 weeks and reassess her symptoms at that time.   I LMTCBx1 with the subject on 27Jun2023. The subject returned my call on 28Jun2023.   I reviewed the following adverse events with the subject:  Nausea-ongoing Vomiting-has not vomited x 1 week, but feels the urge to do so, but holds back Vertigo-ongoing URI-improved, but still c/o sinus congestion, green mucus, and cough Muscle Cramps-ongoing Hot flashes-ongoing Sore throat-ongoing  Subject denies any new symptoms, and states she does feel some better overall even though she is still having all of these symptoms.   I advised the subject of the recommendation to stop taking the study drug today x 2 weeks. The subject has already taken her dose this morning of 6 puffs but stated understanding to not take anymore.   Subject has Week 16 visit scheduled for next week on 06Jul2023. I advised the subject to still attend this visit and to bring all of the study drug and the machine with her to the visit as she has always done.   Subject stated understanding of all of the above.

## 2021-07-21 ENCOUNTER — Other Ambulatory Visit: Payer: Self-pay | Admitting: Pulmonary Disease

## 2021-07-21 DIAGNOSIS — J849 Interstitial pulmonary disease, unspecified: Secondary | ICD-10-CM

## 2021-07-26 ENCOUNTER — Telehealth: Payer: Self-pay

## 2021-07-26 NOTE — Telephone Encounter (Signed)
She returned my call, explained PREP, she wants to see her doctor tomorrow and talk with them about the referral; she is already working out at [employer] 3 times a week and walks 3 miles daily; not sure this class would be beneficial; she will call me back and let me know what she decides.

## 2021-07-26 NOTE — Telephone Encounter (Signed)
Called to discuss PREP program referral; left voicemail.

## 2021-07-27 ENCOUNTER — Encounter: Payer: Medicare Other | Admitting: *Deleted

## 2021-07-27 DIAGNOSIS — Z006 Encounter for examination for normal comparison and control in clinical research program: Secondary | ICD-10-CM

## 2021-07-27 DIAGNOSIS — J84112 Idiopathic pulmonary fibrosis: Secondary | ICD-10-CM

## 2021-07-27 NOTE — Research (Signed)
Title:A Randomized, Double-blind, Placebo-controlled, Phase 3 Study of the Efficacy and Safety of Inhaled Treprostinil in Subjects with Idiopathic Pulmonary Fibrosis   Dose and Duration of Treatment:Treprostinil for Inhalation 0.6 mg/mL, or placebo (randomly assigned 1:1) over a 52 week period.  Protocol # RIN-PF-301; IND # [id number]; Clinical Trials.gov Identifier: [id number]  Sponsor: [employer] [place], [place] [id number]  Protocol Version/Amendment: Amendment 3, date 25Apr2022 for 07/27/2021 date of yes   Consent Version: ver.2  for 07/27/2021 date of 15Feb2023  Protocol Version: Amendment 3 approved 16 May 2020  IB: Version 18- 15Jun23  ICF: Amendment 1 Version 2.0 approved 29Dec2020  Investigator Brochure Product:  Treprostinil for Inhalation, 0.6 mg/mL  Key Features of the Drug: Treprostinil (LRX-15, 15AU81, UT-15) is a stable tricyclic benzindene analogue of the naturally occurring prostacyclin (PGI2; epoprostenol), a member of the eicosanoid family of autocoids; these compounds occur widely in tissues and have important pharmacological properties with potent activity, especially on the cardiovascular system and smooth muscle ([place]). An inhalation solution of treprostinil, 0.6 mg/mL, delivered using an ultrasonic nebulizer, is currently FDA approved to treat pulmonary arterial hypertension (PAH); [WHO Group 1, PH-ILD; WHO Group 3. Inhaled treprostinil is NOW being evaluated as primary treatment of IPF, idiopathic pulmonary fibrosis.   Key Inclusion Criteria: ?80 years of age Diagnosis of IPF based on the 2018 ATS/ERS/JRS/ALAT Clinical Practice Guideline (FVC ?45% predicted at Screening).  Subjects on pirfenidone or nintedanib must be on a stable and optimized dose for ?30 days prior to Baseline. Concomitant use of both pirfenidone and nintedanib is not permitted. Males with a partner of childbearing potential must use a condom for the duration of treatment  and for at least 48 hours after discontinuing study drug.   Key Exclusion Criteria: Subject is pregnant or lactating.  FEV1/FVC <0.70 at Screening.  Prior intolerance or significant lack of efficacy to a prostacyclin or prostacyclin analogue that resulted in discontinuation or inability to effectively titrate that therapy.  The subject has received any PAH-approved therapy, including prostacyclin therapy (epoprostenol, treprostinil, iloprost, or beraprost; except for acute vasoreactivity testing), IP receptor agonists (selexipag), endothelin receptor antagonists, phosphodiesterase type 5 inhibitors (PDE5-Is), or soluble guanylate cyclase stimulators within 60 days prior to Baseline. As needed use of a PDE5-I for erectile dysfunction is permitted, provided no doses are taken within 48 hours of any study-related efficacy assessments.  Use of any of the following medications:  `     a. Azathioprine (AZA), cyclosporine, mycophenolate mofetil, tacroliumus, oral           corticosteroids (OCS) >20 mg/day or the combination of            OCS+AZA+N-acetylcysteine within 30 days prior to Baseline.        b. Cyclophosphamide within 60 days prior to Baseline        c. Rituximab within 6 months prior to Baseline   Receiving >10 L/min of oxygen at rest at Baseline.   Exacerbation of IPF or active pulmonary or upper respiratory infection within 30 days prior to Baseline. Subjects must have completed any antibiotic or steroid regimens for treatment of the infection or acute exacerbation more than 30 days prior to Baseline to be eligible. If hospitalized for an acute exacerbation of IPF or a pulmonary or upper respiratory infection, subjects must have been discharged more than 90 days prior to Baseline to be eligible.  Uncontrolled cardiac disease, defined as myocardial infarction within 6 months prior to Baseline or unstable angina within 30 days prior  to Baseline. Use of any investigational drug/device or  participation in any investigational study in which the subject received a medical intervention (ie, procedure, device, medication/supplement) within 30 days prior to Screening. Subjects participating in non-interventional, observational, or registry studies are eligible.  Life expectancy <6 months due to IPF or a concomitant illness.  Acute pulmonary embolism within 90 days prior to Baseline.   Pharmacodynamics:  (Dose-Response Relationships) - In a clinical study of 241 healthy volunteers, single doses of Tyvaso 54 ?g (the target maintenance dose per session) and 84 ?g (supratherapeutic inhalation dose) prolonged the QTc interval by approximately 10 ms. The QTc effect dissipated rapidly as the concentration of treprostinil decreased.  Pharmacokinetics: (ADME) - Treprostinil is substantially metabolized by the liver, primarily by CYP2C8.  The elimination of treprostinil (following [place] administration of treprostinil) is biphasic, with a terminal elimination t1?2 of approximately 4 hours.    Contraindications:   None  Special Warnings/Considerations: Treprostinil undergoes substantial hepatic metabolism; thus, liver impairment could result in decreased metabolism and increased systemic exposure to treprostinil potentiating the occurrence and/or severity of AEs and/or overdose. There are no adequate and well-controlled studies with Tyvaso in pregnant women. There are risks to the mother and the fetus associated with PAH. Patients who discontinue the use of any effective therapy may be expected to notice a decline in their clinical status. Patients who abruptly discontinue therapy (ie, miss 2 or more doses), or markedly reduce the dose, are at risk for worsening of PAH symptoms or a rebound in [facility]. There is no evidence to suggest or suspect that patients taking inhaled treprostinil would be at risk for withdrawal symptoms or rebound effects.  Treprostinil inhibits platelet aggregation and increases the  risk of bleeding, particularly with concomitant anticoagulants and antiplatelet therapies. Treprostinil is a pulmonary and systemic vasodilator. In patients with low systemic arterial pressure, inhaled treprostinil solution may cause symptomatic hypotension, which may present as but is not limited to light-headedness, dizziness, blurred or faded vision, and syncope. Concomitant administration of treprostinil with diuretics, antihypertensive agents, or other vasodilators that may lower blood pressure increases the risk of symptomatic hypotension. Signs and symptoms of overdose with inhaled treprostinil solution may correspond to dose-limiting pharmacologic effects, including diarrhea, flushing, headache, hypotension, nausea, and vomiting. Most events are self-limiting and resolve with reduction or withholding of inhaled treprostinil solution.  Interactions:              Drug Interaction Studies Conducted with Treprostinil: Study # P01:08: Treprostinil formulation [place] Remodulin vs Acetaminophen, to evaluate effect of acetaminophen on Treprostinil pK, showed no effect. Study # P01:12: Treprostinil formulation [place] Remodulin vs Warfarin, to evaluate effect of Warfarin on Treprostinil pK/PD, showed no effect. Study TDE-PH-105: Treprostinil formulation Orenitram vs Bosentan, to evaluate effect of Bosentan on Treprostinil pK, showed no effect. Study TDE-PH-106: Treprostinil formulation Orenitram vs Sildenafil, to evaluate effect of Sildenafil on Treprostinil pK, showed no effect. Study TDE-PH-109: Treprostinil formulation Orenitram vs Rifampicin (CYP2C8/9 inducer), to evaluate the effect of inducing the KVQ2V9 metabolic pathway on the pK of Treprostinil, confirmed metabolic pathway via DGL8V5/6. Expected interaction resulting in reduced Cmax and AUC. Study TDE-PH-110: Treprostinil formulation Orenitram vs Gemfibrozil (CYP2C8  inhibitor), to evaluate the effect of inhibiting the EPP2R5 metabolic pathway on the pK of  Treprostinil, confirmed metabolic pathway primarily via CYP2C8. Expected interaction resulting in significantly increased Cmax and AUC. Study TDE-PH-110: Treprostinil formulation Orenitram vs Fluconazole (CYP2C9 inhibitor), to evaluate the effect of inhibiting the JOA4Z6 metabolic pathway on on the pK of  Treprostinil, had no notable effect. Confirmed CYP2C9 plays a minor role in treprostinil metabolism.  Serious Adverse Reactions Observed in Clinical Studies with Inhaled Treprostinil: Pulmonary Arterial Hypertension: all occurrences from RIN-PH-301 study for Pulmonary Arterial Hypertension. No serious adverse reactions met criteria for inclusion in inhaled treprostinil solution clinical studies for interstitial lung disease. System/Organ Class: Nervous System disorders - Syncope in 3 of 115 subjects exposed (2.6%)      Serious Adverse Reactions Observed Postmarketing: Gastrointestinal disorders: vomiting, nausea, diarrhea, GI Hemorrhage (reflects an aggregate of the following: GI hemorrhage, rectal hemorrhage, blood in stool [hematochezia or melena], and hematemesis). General disorders and administration site conditions: Pain Infections and infestations: Pneumonia Nervous system disorders: Syncope, dizziness, headache Respiratory, thoracic and mediastinal disorders: Hemoptysis, cough, epistaxis Vascular disorders: Hypotension, hemorrhage (unspecified blood loss ranging from nondescript to heavy or excessive bleeding).  Safety Data:  As of 08 August 2020, approximately 955 subjects have received treprostinil by inhalation across various studies, ranging from acute studies in healthy volunteers to long-term treatment in subjects with PAH and PH-ILD.   The estimate of worldwide exposure to treprostinil in the marketed inhaled formulations is approximately 38,[phone] total patient treatment-days (approximately 34,318 patient treatment-years). The most common adverse effects of inhaled treprostinil ?10%  were cough, diarrhea, dizziness, flushing, headache, muscle/jaw/bone pain, nausea, pharyngolaryngeal pain, oropharyngeal pain, syncope, and throat irritation. Other adverse reactions in the observational study comparing subjects taking inhaled treprostinil and a control group were cough, hemoptysis, nasal discomfort, and throat irritation. Angioedema is a rare but serious reaction that has been seen in the post-marketing setting with treprostinil.  Stability:  Ampoules of drug product are stable until the date indicated when stored in the unopened foil pouch. The [place] commercial labeling states the following regarding storage conditions: Store at [place] to [place] (23F to 21F), with excursions permitted to 71I to 96V (27F to 4F).  Once the foil pack is opened, ampoules should be used within 7 days. Because Tyvaso is light-sensitive, unopened ampoules should be stored in the foil pouch.   [facility] [place] Coordinator note :   This visit for Subject Elizabeth Espinoza with DOB: [id number] on 07/27/2021 for the above protocol is Visit/Encounter # week 16 and is for purpose of research.   Protocol Version: Amendment 3 approved 16 May 2020  IB: Version 18- 15Jun23  ICF: Amendment 1 Version 2.0 approved 29Dec2020  Ms. Debold expressed continued interest and consent in continuing as a study subject. Subject confirmed that there was  no change in contact information (e.g. address, telephone, email). Subject thanked for participation in research and contribution to science.  In this visit 07/27/2021 the subject will not have a physical exam.  However the PI Dr. Chase Caller did come by and speak with the patient regarding AE's.  Subject is on a 2 week holiday from study drug and the PI discussed with her three options.  Option 1 was to quit the entire study. Option 2 was to stop the study drug but stay on the study and do the assessments. Option 3 was to finish the holiday and re-challenge the  study drug.  Both the subject and the PI were agreeable to re-challenge the study drug. Subject is instructed to start August 02 2021 at 3 puffs.  The subject was instructed and sent home with instructions how to increase dose every 3 days.  The coordinator also let the subject know she would be calling once the study drug was restarted to check in.  Marland Kitchen  This research coordinator has verified that the above investigator is up to date with his/her training logs.   The Subject was informed that the PI Dr. Chase Caller continues to have oversight of the subject's visits and course  through relevant discussions, reviews and also specifically of this visit by routing of this note to the [place].   Signed by  Jaye Beagle, RN MSN/MBA  Clinical Research Coordinator / Nurse [place], [place] 10:44 AM 07/27/2021

## 2021-07-28 ENCOUNTER — Telehealth: Payer: Self-pay

## 2021-07-28 NOTE — Telephone Encounter (Signed)
T, I was not sure so I had to look it up.  It appears that this med can cause a degree of hyperthyroidism.  I see that her labs were recently slightly abnormal.  I cannot rule out that the medication may be to blame.  I would like to repeat the tests, just to make sure, in approximately 4 weeks.  At that time, we may need to decrease the dose of levothyroxine, if the TSH is still low.

## 2021-07-28 NOTE — Telephone Encounter (Signed)
Pt called regarding recent labs collected by PCP. Pt has been prescribed treprostinil and wanted to know if this could be contributing to her TSH levels. She can come in if its needed.

## 2021-07-28 NOTE — Telephone Encounter (Signed)
Yes, I saw that

## 2021-08-07 NOTE — Telephone Encounter (Signed)
Lvm for pt to contact office for a follow up lab appt.

## 2021-08-08 NOTE — Progress Notes (Signed)
HPI: FU dyspnea and coronary calcification. ETT September 2016 normal. CPX January 2020 showed normal functional capacity for age with duration 8:45; no ST changes; patient complained of chest pain, dyspnea and dizziness; there were other abnormal parameters suggesting cardiovascular limitation and also pulmonary impairment. Echocardiogram May 2020 showed normal LV function, grade 2 diastolic dysfunction and mild mitral valve prolapse.  Cardiac CTA June 2020 showed a calcium score of 0 and minimal nonobstructive coronary disease. Chest CT October 2022 showed UIP, coronary calcification..  Since last seen she has dyspnea with walking up hills but is able to walk 3 miles on level ground with no dyspnea.  No orthopnea, PND or pedal edema.  She does not have exertional chest pain.  She does occasionally have pain in her chest when she uses an inhaler that is experimental for interstitial lung disease.  Current Outpatient Medications  Medication Sig Dispense Refill   aspirin EC 81 MG tablet Take 1 tablet (81 mg total) by mouth daily. 90 tablet 3   cholecalciferol (VITAMIN D3) 25 MCG (1000 UT) tablet Take 1,000 Units by mouth daily.     clobetasol (TEMOVATE) 0.05 % external solution SMARTSIG:1 Milliliter(s) Topical Twice Daily     esomeprazole (NEXIUM) 40 MG capsule Take 40 mg by mouth every morning.     famotidine (PEPCID) 40 MG tablet Take 1 tablet (40 mg total) by mouth daily. 90 tablet 1   fluticasone (FLONASE) 50 MCG/ACT nasal spray Place 1 spray into both nostrils daily as needed for allergies or rhinitis.     gabapentin (NEURONTIN) 100 MG capsule Take 1-2 capsules (100-200 mg total) by mouth at bedtime. 60 capsule 3   hydroxychloroquine (PLAQUENIL) 200 MG tablet Take 200 mg by mouth 2 (two) times daily.     magnesium oxide (MAG-OX) 400 MG tablet Take 400 mg by mouth daily.     Multiple Vitamins-Minerals (ZINC PO) Take 1 tablet by mouth.     pravastatin (PRAVACHOL) 40 MG tablet TAKE 1 TABLET  BY MOUTH  DAILY 100 tablet 2   Probiotic Product (PROBIOTIC ADVANCED) CAPS Take by mouth.     rOPINIRole (REQUIP) 0.5 MG tablet TAKE 2 TABLETS BY MOUTH  AFTER DINNER EACH NIGHT. 180 tablet 3   SYNTHROID 88 MCG tablet Take 1 tablet by mouth 30 minutes before breakfast 6 days a week only     vitamin B-12 (CYANOCOBALAMIN) 500 MCG tablet Take 500 mcg by mouth daily.     No current facility-administered medications for this visit.     Past Medical History:  Diagnosis Date   Allergic rhinitis    GERD (gastroesophageal reflux disease)    Glaucoma    SUSPECT   Hiatal hernia    Hyperlipidemia    LDL goal = < 100   Hypothyroidism    ILD (interstitial lung disease) (HCC)    Lichen planopilaris    MVP (mitral valve prolapse)    documented on 2 D ECHO   Restless legs    Urticaria     Past Surgical History:  Procedure Laterality Date   APPENDECTOMY     with [place]   negative   COLONOSCOPY  2005   Dr Olevia Perches   COLONOSCOPY  01/2013   Dr Collene Mares   esophageal dilation  07/04/2011   Dr Estrella Myrtle SIGMOIDOSCOPY  1999    Dr Olevia Perches   KNEE ARTHROSCOPY  2001, 2005   Dr Percell Miller   bilat  TOTAL ABDOMINAL HYSTERECTOMY     for Endometriosis; no BSO, Dr Colin Ina   UPPER GI ENDOSCOPY  01/2013   Dr Collene Mares   VIDEO BRONCHOSCOPY Bilateral 02/05/2019   Procedure: VIDEO BRONCHOSCOPY WITH FLUORO;  Surgeon: Brand Males, MD;  Location: [facility] ENDOSCOPY;  Service: Endoscopy;  Laterality: Bilateral;    Social History   Socioeconomic History   Marital status: Married    Spouse name: Jeneen Rinks   Number of children: 2   Years of education: Not on file   Highest education level: Not on file  Occupational History   Occupation: retired from [employer] retired  Tobacco Use   Smoking status: Never   Smokeless tobacco: Never  Vaping Use   Vaping Use: Never used  Substance and Sexual Activity   Alcohol use: Yes    Comment:  rarely   Drug use: No   Sexual activity: Yes   Other Topics Concern   Not on file  Social History Narrative   Not on file   Social Determinants of Health   Financial Resource Strain: Low Risk  (10/20/2020)   Overall Financial Resource Strain (CARDIA)    Difficulty of Paying Living Expenses: Not hard at all  Food Insecurity: No [place] (10/20/2020)   Hunger Vital Sign    Worried About Running Out of Food in the Last Year: Never true    [place] in the Last Year: Never true  Transportation Needs: No Transportation Needs (10/20/2020)   PRAPARE - Hydrologist (Medical): No    Lack of Transportation (Non-Medical): No  Physical Activity: Sufficiently Active (10/20/2020)   Exercise Vital Sign    Days of Exercise per Week: 7 days    Minutes of Exercise per Session: 30 min  Stress: No Stress Concern Present (10/20/2020)   [place]    Feeling of Stress : Not at all  Social Connections: Moderately Isolated (10/20/2020)   Social Connection and Isolation Panel [NHANES]    Frequency of Communication with Friends and Family: More than three times a week    Frequency of Social Gatherings with Friends and Family: More than three times a week    Attends Religious Services: Never    Marine scientist or Organizations: No    Attends Archivist Meetings: Never    Marital Status: Married  Human resources officer Violence: Not At Risk (10/20/2020)   Humiliation, Afraid, Rape, and Kick questionnaire    Fear of Current or Ex-Partner: No    Emotionally Abused: No    Physically Abused: No    Sexually Abused: No    Family History  Problem Relation Age of Onset   Lung cancer Father        Asbestos with mesothelioma   Heart disease Paternal Grandmother        ? etiology   Diabetes Paternal Grandmother    Diabetes Brother    Uterine cancer Paternal Aunt    Stroke Paternal Grandfather        > 77   Heart attack Brother 67    Allergies Sister    Aneurysm Mother        congenital   Stroke Brother        two strokes   Barrett's esophagus Brother     ROS: no fevers or chills, productive cough, hemoptysis, dysphasia, odynophagia, melena, hematochezia, dysuria, hematuria, rash, seizure activity, orthopnea, PND, pedal edema, claudication. Remaining systems are negative.  Physical Exam: Well-developed well-nourished in no acute distress.  Skin is warm and dry.  HEENT is normal.  Neck is supple.  Chest mild dry basilar crackles Cardiovascular exam is regular rate and rhythm.  Abdominal exam nontender or distended. No masses palpated. Extremities show no edema. neuro grossly intact  ECG-normal sinus rhythm at a rate of 64, no significant ST changes.  Personally reviewed  A/P  1 coronary artery disease-minimal nonobstructive disease on previous CTA.  Plan to continue medical therapy with aspirin and statin.  2 dyspnea-this is likely secondary to interstitial lung disease.  3 hyperlipidemia-continue statin.  4 interstitial lung disease-Per pulmonary.  Kirk Ruths, MD

## 2021-08-09 ENCOUNTER — Other Ambulatory Visit: Payer: Self-pay | Admitting: Internal Medicine

## 2021-08-09 DIAGNOSIS — Z1231 Encounter for screening mammogram for malignant neoplasm of breast: Secondary | ICD-10-CM

## 2021-08-22 ENCOUNTER — Encounter: Payer: Self-pay | Admitting: Cardiology

## 2021-08-22 ENCOUNTER — Ambulatory Visit: Payer: Medicare Other | Admitting: Cardiology

## 2021-08-22 VITALS — BP 126/80 | HR 64 | Ht 66.0 in | Wt 137.0 lb

## 2021-08-22 DIAGNOSIS — E782 Mixed hyperlipidemia: Secondary | ICD-10-CM | POA: Diagnosis not present

## 2021-08-22 DIAGNOSIS — R0602 Shortness of breath: Secondary | ICD-10-CM | POA: Diagnosis not present

## 2021-08-22 DIAGNOSIS — I2584 Coronary atherosclerosis due to calcified coronary lesion: Secondary | ICD-10-CM

## 2021-08-22 DIAGNOSIS — I2510 Atherosclerotic heart disease of native coronary artery without angina pectoris: Secondary | ICD-10-CM | POA: Diagnosis not present

## 2021-08-22 NOTE — Patient Instructions (Signed)
Follow-Up: At [facility], you and your health needs are our priority.  As part of our continuing mission to provide you with exceptional heart care, we have created designated Provider Care Teams.  These Care Teams include your primary Cardiologist (physician) and Advanced Practice Providers (APPs -  Physician Assistants and Nurse Practitioners) who all work together to provide you with the care you need, when you need it.  We recommend signing up for the patient portal called "MyChart".  Sign up information is provided on this After Visit Summary.  MyChart is used to connect with patients for Virtual Visits (Telemedicine).  Patients are able to view lab/test results, encounter notes, upcoming appointments, etc.  Non-urgent messages can be sent to your provider as well.   To learn more about what you can do with MyChart, go to [email or web address].    Your next appointment:   12 month(s)  The format for your next appointment:   In Person  Provider:   Kirk Ruths MD

## 2021-08-24 ENCOUNTER — Other Ambulatory Visit (INDEPENDENT_AMBULATORY_CARE_PROVIDER_SITE_OTHER): Payer: Medicare Other

## 2021-08-24 DIAGNOSIS — E039 Hypothyroidism, unspecified: Secondary | ICD-10-CM | POA: Diagnosis not present

## 2021-08-24 LAB — T4, FREE: Free T4: 1.16 ng/dL (ref 0.60–1.60)

## 2021-08-24 LAB — T3, FREE: T3, Free: 3.1 pg/mL (ref 2.3–4.2)

## 2021-08-24 LAB — TSH: TSH: 0.95 u[IU]/mL (ref 0.35–5.50)

## 2021-08-25 ENCOUNTER — Telehealth: Payer: Self-pay

## 2021-08-25 NOTE — Telephone Encounter (Signed)
Pt called with questions about her rx for Synthroid.

## 2021-08-25 NOTE — Telephone Encounter (Signed)
Pt advised she was taking the 100 mcg dose of Synthroid 6 days a week. Pt wanted to know if a new rx was going to be sent in. She was advised she would be taking 88 mcg daily. Pt wanted clarification.

## 2021-08-28 ENCOUNTER — Other Ambulatory Visit: Payer: Self-pay | Admitting: Internal Medicine

## 2021-08-28 MED ORDER — SYNTHROID 88 MCG PO TABS
ORAL_TABLET | ORAL | 3 refills | Status: DC
Start: 2021-08-28 — End: 2022-08-13

## 2021-08-28 NOTE — Telephone Encounter (Signed)
I sent a prescription for 88 mcg Synthroid daily to [facility] in [place].

## 2021-09-18 ENCOUNTER — Telehealth: Payer: Self-pay | Admitting: Internal Medicine

## 2021-09-18 DIAGNOSIS — Z006 Encounter for examination for normal comparison and control in clinical research program: Secondary | ICD-10-CM

## 2021-09-18 NOTE — Telephone Encounter (Cosign Needed)
Elizabeth Espinoza called the office today   Title:A Randomized, Double-blind, Placebo-controlled, Phase 3 Study of the Efficacy and Safety of Inhaled Treprostinil in Subjects with Idiopathic Pulmonary Fibrosis    Dose and Duration of Treatment:Treprostinil for Inhalation 0.6 mg/mL, or placebo (randomly assigned 1:1) over a 52 week period.   Protocol # RIN-PF-301; IND # [id number]; Clinical Trials.gov Identifier: [id number]  Subject called this morning complaining of side effects related to the study drug.  She stated she believes she will have to come off the IP as this is the same symptoms she was having before her holiday from the IP.   The following side effects have gradually gotten worse since the subject receiving 12 puffs QID which is the first dose-16Aug2023 is when she met full dose of IP.  1. Constipation,  2. Diarrhea, 3. Bloated, 4. Hurts to take a deep breath when exercising in her chest 5. Sore throat, 6. Slight intermittent nausea, 7. Headache (ongoing) 8. Cough (ongoing).  Subject states she is swishing and spitting salt water as the mouth wash her her mouth more.  I let her know I would speak with the PI , Dr. Chase Caller and see what further actions need to be taken from this point. Subject states last IP was last night 27Aug2023 and will not be taking it today.   Miia Blanks Geanie Cooley CCRC II, RN, MSN/MBA

## 2021-09-18 NOTE — Telephone Encounter (Signed)
Called spoke with subject. Asked her if she could tolerate the IP until Friday and we could do a Premature Study Drug Discontinuation (PSDD) visit.  Subject states she could do that.  I have her scheduled for this visit on 01Sep2023.  I also instructed her that if the Symptoms became too intense to call us and we can stop the IP.  Subject verbalized understanding. Nothing further needed at this time.

## 2021-09-18 NOTE — Telephone Encounter (Signed)
PI Note  PI OVERSIGHT ATTESTATION  I the Principal Investigator (PI) for the above mentioned study attest that I reviewed the mentioned  clinical research coordinator  notes on research subject  Elizabeth Espinoza  born 1940-12-13 . I  agree with the findings mentioned above   Spoke to subject over phone - she is very keen on stopping study drug. Says even at lower dose of 9 puffs AE is present and she does not want to rechallenge . Says she cannot take it anymore .   Adviesd to stop study drug 09/18/2021 itsefl. Will d/w CRC next steps and get back to [place], MD Pulmonary and Critical Care Medicine Research Investigator & Staff Physician [facility] Pager: [phone], If no answer or between  15:00h - 7:00h: call [phone]  09/18/2021 12:51 PM

## 2021-09-22 ENCOUNTER — Encounter: Payer: Medicare Other | Admitting: *Deleted

## 2021-09-22 ENCOUNTER — Encounter (INDEPENDENT_AMBULATORY_CARE_PROVIDER_SITE_OTHER): Payer: Medicare Other | Admitting: Internal Medicine

## 2021-09-22 DIAGNOSIS — J84112 Idiopathic pulmonary fibrosis: Secondary | ICD-10-CM

## 2021-09-22 DIAGNOSIS — Z006 Encounter for examination for normal comparison and control in clinical research program: Secondary | ICD-10-CM

## 2021-09-22 NOTE — Research (Signed)
Title:A Randomized, Double-blind, Placebo-controlled, Phase 3 Study of the Efficacy and Safety of Inhaled Treprostinil in Subjects with Idiopathic Pulmonary Fibrosis   Dose and Duration of Treatment:Treprostinil for Inhalation 0.6 mg/mL, or placebo (randomly assigned 1:1) over a 52 week period.  Protocol # RIN-PF-301; IND # [id number]; Clinical Trials.gov Identifier: [id number]  Sponsor: [place], [place] [id number]  Protocol Version: Amendment 3 approved 16 May 2020  IB: Version 18- [phone]  ICF: Amendment 1 Version 2.0 approved 29Dec2020 (not updated with protocol amendments per sponsor, Juluis Rainier)   Psychologist, forensic Product:  Treprostinil for Inhalation, 0.6 mg/mL  Key Features of the Drug: Treprostinil (LRX-15, 15AU81, UT-15) is a stable tricyclic benzindene analogue of the naturally occurring prostacyclin (PGI2; epoprostenol), a member of the eicosanoid family of autocoids; these compounds occur widely in tissues and have important pharmacological properties with potent activity, especially on the cardiovascular system and smooth muscle ([place]). An inhalation solution of treprostinil, 0.6 mg/mL, delivered using an ultrasonic nebulizer, is currently FDA approved to treat pulmonary arterial hypertension (PAH); [WHO Group 1, PH-ILD; WHO Group 3. Inhaled treprostinil is NOW being evaluated as primary treatment of IPF, idiopathic pulmonary fibrosis.   Key Inclusion Criteria: ?80 years of age Diagnosis of IPF based on the 2018 ATS/ERS/JRS/ALAT Clinical Practice Guideline (FVC ?45% predicted at Screening).  Subjects on pirfenidone or nintedanib must be on a stable and optimized dose for ?30 days prior to Baseline. Concomitant use of both pirfenidone and nintedanib is not permitted. Males with a partner of childbearing potential must use a condom for the duration of treatment and for at least 48 hours after discontinuing study drug.   Key Exclusion  Criteria: Subject is pregnant or lactating.  FEV1/FVC <0.70 at Screening.  Prior intolerance or significant lack of efficacy to a prostacyclin or prostacyclin analogue that resulted in discontinuation or inability to effectively titrate that therapy.  The subject has received any PAH-approved therapy, including prostacyclin therapy (epoprostenol, treprostinil, iloprost, or beraprost; except for acute vasoreactivity testing), IP receptor agonists (selexipag), endothelin receptor antagonists, phosphodiesterase type 5 inhibitors (PDE5-Is), or soluble guanylate cyclase stimulators within 60 days prior to Baseline. As needed use of a PDE5-I for erectile dysfunction is permitted, provided no doses are taken within 48 hours of any study-related efficacy assessments.  Use of any of the following medications:  `     a. Azathioprine (AZA), cyclosporine, mycophenolate mofetil, tacroliumus, oral           corticosteroids (OCS) >20 mg/day or the combination of            OCS+AZA+N-acetylcysteine within 30 days prior to Baseline.        b. Cyclophosphamide within 60 days prior to Baseline        c. Rituximab within 6 months prior to Baseline   Receiving >10 L/min of oxygen at rest at Baseline.   Exacerbation of IPF or active pulmonary or upper respiratory infection within 30 days prior to Baseline. Subjects must have completed any antibiotic or steroid regimens for treatment of the infection or acute exacerbation more than 30 days prior to Baseline to be eligible. If hospitalized for an acute exacerbation of IPF or a pulmonary or upper respiratory infection, subjects must have been discharged more than 90 days prior to Baseline to be eligible.  Uncontrolled cardiac disease, defined as myocardial infarction within 6 months prior to Baseline or unstable angina within 30 days prior to Baseline. Use of any investigational drug/device or participation in any investigational study in  which the subject received a medical  intervention (ie, procedure, device, medication/supplement) within 30 days prior to Screening. Subjects participating in non-interventional, observational, or registry studies are eligible.  Life expectancy <6 months due to IPF or a concomitant illness.  Acute pulmonary embolism within 90 days prior to Baseline.   Pharmacodynamics:  (Dose-Response Relationships) - In a clinical study of 241 healthy volunteers, single doses of Tyvaso 54 ?g (the target maintenance dose per session) and 84 ?g (supratherapeutic inhalation dose) prolonged the QTc interval by approximately 10 ms. The QTc effect dissipated rapidly as the concentration of treprostinil decreased.  Pharmacokinetics: (ADME) - Treprostinil is substantially metabolized by the liver, primarily by CYP2C8.  The elimination of treprostinil (following [place] administration of treprostinil) is biphasic, with a terminal elimination t1?2 of approximately 4 hours.    Contraindications:   None  Special Warnings/Considerations: Treprostinil undergoes substantial hepatic metabolism; thus, liver impairment could result in decreased metabolism and increased systemic exposure to treprostinil potentiating the occurrence and/or severity of AEs and/or overdose. There are no adequate and well-controlled studies with Tyvaso in pregnant women. There are risks to the mother and the fetus associated with PAH. Patients who discontinue the use of any effective therapy may be expected to notice a decline in their clinical status. Patients who abruptly discontinue therapy (ie, miss 2 or more doses), or markedly reduce the dose, are at risk for worsening of PAH symptoms or a rebound in [facility]. There is no evidence to suggest or suspect that patients taking inhaled treprostinil would be at risk for withdrawal symptoms or rebound effects.  Treprostinil inhibits platelet aggregation and increases the risk of bleeding, particularly with concomitant anticoagulants and antiplatelet  therapies. Treprostinil is a pulmonary and systemic vasodilator. In patients with low systemic arterial pressure, inhaled treprostinil solution may cause symptomatic hypotension, which may present as but is not limited to light-headedness, dizziness, blurred or faded vision, and syncope. Concomitant administration of treprostinil with diuretics, antihypertensive agents, or other vasodilators that may lower blood pressure increases the risk of symptomatic hypotension. Signs and symptoms of overdose with inhaled treprostinil solution may correspond to dose-limiting pharmacologic effects, including diarrhea, flushing, headache, hypotension, nausea, and vomiting. Most events are self-limiting and resolve with reduction or withholding of inhaled treprostinil solution.  Interactions:              Drug Interaction Studies Conducted with Treprostinil: Study # P01:08: Treprostinil formulation [place] Remodulin vs Acetaminophen, to evaluate effect of acetaminophen on Treprostinil pK, showed no effect. Study # P01:12: Treprostinil formulation [place] Remodulin vs Warfarin, to evaluate effect of Warfarin on Treprostinil pK/PD, showed no effect. Study TDE-PH-105: Treprostinil formulation Orenitram vs Bosentan, to evaluate effect of Bosentan on Treprostinil pK, showed no effect. Study TDE-PH-106: Treprostinil formulation Orenitram vs Sildenafil, to evaluate effect of Sildenafil on Treprostinil pK, showed no effect. Study TDE-PH-109: Treprostinil formulation Orenitram vs Rifampicin (CYP2C8/9 inducer), to evaluate the effect of inducing the JOI7O6 metabolic pathway on the pK of Treprostinil, confirmed metabolic pathway via VEH2C9/4. Expected interaction resulting in reduced Cmax and AUC. Study TDE-PH-110: Treprostinil formulation Orenitram vs Gemfibrozil (CYP2C8  inhibitor), to evaluate the effect of inhibiting the BSJ6G8 metabolic pathway on the pK of Treprostinil, confirmed metabolic pathway primarily via CYP2C8. Expected  interaction resulting in significantly increased Cmax and AUC. Study TDE-PH-110: Treprostinil formulation Orenitram vs Fluconazole (CYP2C9 inhibitor), to evaluate the effect of inhibiting the ZMO2H4 metabolic pathway on on the pK of Treprostinil, had no notable effect. Confirmed CYP2C9 plays a minor role in treprostinil metabolism.  Serious Adverse Reactions Observed in Clinical Studies with Inhaled Treprostinil: Pulmonary Arterial Hypertension: all occurrences from RIN-PH-301 study for Pulmonary Arterial Hypertension. No serious adverse reactions met criteria for inclusion in inhaled treprostinil solution clinical studies for interstitial lung disease. System/Organ Class: Nervous System disorders - Syncope in 3 of 115 subjects exposed (2.6%)      Serious Adverse Reactions Observed Postmarketing: Gastrointestinal disorders: vomiting, nausea, diarrhea, GI Hemorrhage (reflects an aggregate of the following: GI hemorrhage, rectal hemorrhage, blood in stool [hematochezia or melena], and hematemesis). General disorders and administration site conditions: Pain Infections and infestations: Pneumonia Nervous system disorders: Syncope, dizziness, headache Respiratory, thoracic and mediastinal disorders: Hemoptysis, cough, epistaxis Vascular disorders: Hypotension, hemorrhage (unspecified blood loss ranging from nondescript to heavy or excessive bleeding).  Safety Data:  As of 08 August 2020, approximately 955 subjects have received treprostinil by inhalation across various studies, ranging from acute studies in healthy volunteers to long-term treatment in subjects with PAH and PH-ILD.   The estimate of worldwide exposure to treprostinil in the marketed inhaled formulations is approximately 18,563,149 total patient treatment-days (approximately 34,318 patient treatment-years). The most common adverse effects of inhaled treprostinil ?10% were cough, diarrhea, dizziness, flushing, headache, muscle/jaw/bone pain,  nausea, pharyngolaryngeal pain, oropharyngeal pain, syncope, and throat irritation. Other adverse reactions in the observational study comparing subjects taking inhaled treprostinil and a control group were cough, hemoptysis, nasal discomfort, and throat irritation. Angioedema is a rare but serious reaction that has been seen in the post-marketing setting with treprostinil.  Stability:  Ampoules of drug product are stable until the date indicated when stored in the unopened foil pouch. The [place] commercial labeling states the following regarding storage conditions: Store at [place] to [place] (26F to 71F), with excursions permitted to 70Y to 63Z (17F to 41F).  Once the foil pack is opened, ampoules should be used within 7 days. Because Tyvaso is light-sensitive, unopened ampoules should be stored in the foil pouch.   [facility] Coordinator note :   This visit for Subject Elizabeth Espinoza with DOB: September 28, 1940 on 09/22/2021 for the above protocol is Visit/Encounter # PSDD  and is for purpose of research.   Protocol Version: Amendment 3 approved 16 May 2020  IB: Version 18- 15Jun23  ICF: Amendment 1 Version 2.0 approved 29Dec2020 (not updated with protocol amendments per sponsor, FYI)   Subject expressed continued interest and consent in continuing as a study subject. Subject confirmed that there was  no change in contact information (e.g. address, telephone, email). Subject thanked for participation in research and contribution to science.  In this visit 09/22/2021 the subject will be evaluated by Principal Investigator Dr. Brand Males. This research coordinator has verified that the above investigator is up to date with his/her training logs.   The Subject was informed that the PI Dr. Chase Caller continues to have oversight of the subject's visits and course  through relevant discussions, reviews and also specifically of this visit by routing of this note to the [place].   This  visit is a key visit of PSDD The PI is available for this visit.    Subject arrived for visit and all assessments per schedule of assessments in the protocol were performed. The subject brought back all her supplies related to dosing with study drug.  AE's have gone unchanged since phone call and no new AE's have developed.  There was an update in her con meds which is documented in subject binder along with all results from today's  study assessments.  Subject has decided to stay in the study to finish out the 52 weeks.  Reviewed when next visit is scheduled.       Signed by  Jaye Beagle RN MSN/MBA  Clinical Research Coordinator / Nurse [facility]  [place], [place] 10:20 AM 09/22/2021

## 2021-09-22 NOTE — Progress Notes (Signed)
Title:A Randomized, Double-blind, Placebo-controlled, Phase 3 Study of the Efficacy and Safety of Inhaled Treprostinil in Subjects with Idiopathic Pulmonary Fibrosis   Dose and Duration of Treatment:Treprostinil for Inhalation 0.6 mg/mL, or placebo (randomly assigned 1:1) over a 52 week period.  Protocol # RIN-PF-301; IND # [id number]; Clinical Trials.gov Identifier: [id number]  Sponsor: [place], [place] [address]    Key Features of the Drug: Treprostinil (LRX-15, 15AU81, UT-15) is a stable tricyclic benzindene analogue of the naturally occurring prostacyclin (PGI2; epoprostenol), a member of the eicosanoid family of autocoids; these compounds occur widely in tissues and have important pharmacological properties with potent activity, especially on the cardiovascular system and smooth muscle ([place]). An inhalation solution of treprostinil, 0.6 mg/mL, delivered using an ultrasonic nebulizer, is currently FDA approved to treat pulmonary arterial hypertension (PAH); [WHO Group 1, PH-ILD; WHO Group 3. Inhaled treprostinil is NOW being evaluated as primary treatment of IPF, idiopathic pulmonary fibrosis.   Xxxxx   This visit for Subject Elizabeth Espinoza with DOB: 06-05-1940 on 09/22/2021 for the above protocol is Visit/Encounter #  PREMATURE STUDY DRUG DISCONTINUATION  and is for purpose of research . Subject/LAR expressed continued interest and consent in continuing as a study subject. Subject thanked for participation in research and contribution to science.    Subject called earlier in week - lot of AEs with rechallenged. Did not want to try methods to re-titrate. Absolutely wants to stop study drug. I oka;eyed it. She has continued study drug till today based on our advise and formally do it today at this visit. She will remain in study without study drug. Talked about other study options but she prefers to remain in this study as participant but without  IP   Exam done  A Research IPF  P - study drug discontinuation per protocol due to AEs - continue in study visits    SIGNATURE    Dr. Brand Males, M.D., F.C.C.P, ACRP-CPI Pulmonary and [place], [facility] [place] Staff Physician, [place] Director - Interstitial Lung Disease  Program  [facility]ulmonary [facility] [place], [place], [address]   Pager: [phone], If no answer  OR between  19:00-7:00h: page [phone] Telephone (research): [phone]  10:00 AM 09/22/2021   10:00 AM 09/22/2021

## 2021-09-22 NOTE — Patient Instructions (Signed)
ICD-10-CM   1. Research subject  Z00.6     2. IPF (idiopathic pulmonary fibrosis) ([place])  J84.112       Per prptoocol

## 2021-09-25 ENCOUNTER — Other Ambulatory Visit: Payer: Self-pay | Admitting: Internal Medicine

## 2021-09-26 ENCOUNTER — Ambulatory Visit
Admission: RE | Admit: 2021-09-26 | Discharge: 2021-09-26 | Disposition: A | Discharge status: Home / Self Care | Payer: Medicare Other | Source: Ambulatory Visit | Attending: Internal Medicine | Admitting: Internal Medicine

## 2021-09-26 DIAGNOSIS — Z1231 Encounter for screening mammogram for malignant neoplasm of breast: Secondary | ICD-10-CM | POA: Diagnosis not present

## 2021-09-28 ENCOUNTER — Other Ambulatory Visit: Payer: Self-pay | Admitting: Internal Medicine

## 2021-09-28 DIAGNOSIS — R928 Other abnormal and inconclusive findings on diagnostic imaging of breast: Secondary | ICD-10-CM

## 2021-10-09 DIAGNOSIS — L650 Telogen effluvium: Secondary | ICD-10-CM | POA: Diagnosis not present

## 2021-10-09 DIAGNOSIS — L661 Lichen planopilaris: Secondary | ICD-10-CM | POA: Diagnosis not present

## 2021-10-10 ENCOUNTER — Ambulatory Visit
Admission: RE | Admit: 2021-10-10 | Discharge: 2021-10-10 | Disposition: A | Discharge status: Home / Self Care | Payer: Medicare Other | Source: Ambulatory Visit | Attending: Internal Medicine | Admitting: Internal Medicine

## 2021-10-10 DIAGNOSIS — R928 Other abnormal and inconclusive findings on diagnostic imaging of breast: Secondary | ICD-10-CM

## 2021-10-10 DIAGNOSIS — N6489 Other specified disorders of breast: Secondary | ICD-10-CM | POA: Diagnosis not present

## 2021-10-11 ENCOUNTER — Telehealth: Payer: Self-pay | Admitting: Internal Medicine

## 2021-10-11 NOTE — Telephone Encounter (Signed)
Left message for patient to call back to schedule Medicare Annual Wellness Visit   Last AWV  10/20/20  Please schedule at anytime with [facility] if patient calls the office back.     Any questions, please call me at [phone]

## 2021-10-12 DIAGNOSIS — L814 Other melanin hyperpigmentation: Secondary | ICD-10-CM | POA: Diagnosis not present

## 2021-10-12 DIAGNOSIS — L218 Other seborrheic dermatitis: Secondary | ICD-10-CM | POA: Diagnosis not present

## 2021-10-12 DIAGNOSIS — B078 Other viral warts: Secondary | ICD-10-CM | POA: Diagnosis not present

## 2021-10-12 DIAGNOSIS — D225 Melanocytic nevi of trunk: Secondary | ICD-10-CM | POA: Diagnosis not present

## 2021-10-12 DIAGNOSIS — D1801 Hemangioma of skin and subcutaneous tissue: Secondary | ICD-10-CM | POA: Diagnosis not present

## 2021-10-12 DIAGNOSIS — L570 Actinic keratosis: Secondary | ICD-10-CM | POA: Diagnosis not present

## 2021-10-12 DIAGNOSIS — L821 Other seborrheic keratosis: Secondary | ICD-10-CM | POA: Diagnosis not present

## 2021-10-18 ENCOUNTER — Telehealth: Payer: Self-pay | Admitting: Internal Medicine

## 2021-10-18 DIAGNOSIS — E039 Hypothyroidism, unspecified: Secondary | ICD-10-CM

## 2021-10-18 NOTE — Telephone Encounter (Signed)
I ordered the TSH for the [facility] lab.  In the future if Dr. Cruzita Lederer is managing she may need to go to her office because I will not always know when she wants the lab rechecked

## 2021-10-18 NOTE — Telephone Encounter (Signed)
PT calls today in regards to lab orders. PT had believed that they were needing labs done either today or tomorrow at the [place] office to check thyroid levels. I informed PT that these orders had not been placed and that I would relay back to Dr.Burns to see if these needed put in or not.   CB: [phone]

## 2021-10-19 ENCOUNTER — Encounter: Payer: Medicare Other | Admitting: *Deleted

## 2021-10-19 DIAGNOSIS — Z006 Encounter for examination for normal comparison and control in clinical research program: Secondary | ICD-10-CM

## 2021-10-19 DIAGNOSIS — J84112 Idiopathic pulmonary fibrosis: Secondary | ICD-10-CM

## 2021-10-19 NOTE — Research (Signed)
Title:A Randomized, Double-blind, Placebo-controlled, Phase 3 Study of the Efficacy and Safety of Inhaled Treprostinil in Subjects with Idiopathic Pulmonary Fibrosis   Dose and Duration of Treatment:Treprostinil for Inhalation 0.6 mg/mL, or placebo (randomly assigned 1:1) over a 52 week period.  Protocol # RIN-PF-301; IND # [id number]; Clinical Trials.gov Identifier: [id number]  Sponsor: [place], [place] [address]  Protocol Version: Amendment 4 dated: 26Jul2023  IB: Version 18- [id number]  ICF: Amendment 1 Version 3.0 approved 03Aug2023   Investigator Brochure Product:  Treprostinil for Inhalation, 0.6 mg/mL  Key Features of the Drug: Treprostinil (LRX-15, 15AU81, UT-15) is a stable tricyclic benzindene analogue of the naturally occurring prostacyclin (PGI2; epoprostenol), a member of the eicosanoid family of autocoids; these compounds occur widely in tissues and have important pharmacological properties with potent activity, especially on the cardiovascular system and smooth muscle ([place]). An inhalation solution of treprostinil, 0.6 mg/mL, delivered using an ultrasonic nebulizer, is currently FDA approved to treat pulmonary arterial hypertension (PAH); [WHO Group 1, PH-ILD; WHO Group 3. Inhaled treprostinil is NOW being evaluated as primary treatment of IPF, idiopathic pulmonary fibrosis.   Key Inclusion Criteria: ?80 years of age Diagnosis of IPF based on the 2018 ATS/ERS/JRS/ALAT Clinical Practice Guideline (FVC ?45% predicted at Screening).  Subjects on pirfenidone or nintedanib must be on a stable and optimized dose for ?30 days prior to Baseline. Concomitant use of both pirfenidone and nintedanib is not permitted. Males with a partner of childbearing potential must use a condom for the duration of treatment and for at least 48 hours after discontinuing study drug.   Key Exclusion Criteria: Subject is pregnant or lactating.  FEV1/FVC <0.70 at  Screening.  Prior intolerance or significant lack of efficacy to a prostacyclin or prostacyclin analogue that resulted in discontinuation or inability to effectively titrate that therapy.  The subject has received any PAH-approved therapy, including prostacyclin therapy (epoprostenol, treprostinil, iloprost, or beraprost; except for acute vasoreactivity testing), IP receptor agonists (selexipag), endothelin receptor antagonists, phosphodiesterase type 5 inhibitors (PDE5-Is), or soluble guanylate cyclase stimulators within 60 days prior to Baseline. As needed use of a PDE5-I for erectile dysfunction is permitted, provided no doses are taken within 48 hours of any study-related efficacy assessments.  Use of any of the following medications:  `     a. Azathioprine (AZA), cyclosporine, mycophenolate mofetil, tacroliumus, oral           corticosteroids (OCS) >20 mg/day or the combination of            OCS+AZA+N-acetylcysteine within 30 days prior to Baseline.        b. Cyclophosphamide within 60 days prior to Baseline        c. Rituximab within 6 months prior to Baseline   Receiving >10 L/min of oxygen at rest at Baseline.   Exacerbation of IPF or active pulmonary or upper respiratory infection within 30 days prior to Baseline. Subjects must have completed any antibiotic or steroid regimens for treatment of the infection or acute exacerbation more than 30 days prior to Baseline to be eligible. If hospitalized for an acute exacerbation of IPF or a pulmonary or upper respiratory infection, subjects must have been discharged more than 90 days prior to Baseline to be eligible.  Uncontrolled cardiac disease, defined as myocardial infarction within 6 months prior to Baseline or unstable angina within 30 days prior to Baseline. Use of any investigational drug/device or participation in any investigational study in which the subject received a medical intervention (ie, procedure, device,  medication/supplement) within  30 days prior to Screening. Subjects participating in non-interventional, observational, or registry studies are eligible.  Life expectancy <6 months due to IPF or a concomitant illness.  Acute pulmonary embolism within 90 days prior to Baseline.   Pharmacodynamics:  (Dose-Response Relationships) - In a clinical study of 241 healthy volunteers, single doses of Tyvaso 54 ?g (the target maintenance dose per session) and 84 ?g (supratherapeutic inhalation dose) prolonged the QTc interval by approximately 10 ms. The QTc effect dissipated rapidly as the concentration of treprostinil decreased.  Pharmacokinetics: (ADME) - Treprostinil is substantially metabolized by the liver, primarily by CYP2C8.  The elimination of treprostinil (following [place] administration of treprostinil) is biphasic, with a terminal elimination t1?2 of approximately 4 hours.    Contraindications:   None  Special Warnings/Considerations: Treprostinil undergoes substantial hepatic metabolism; thus, liver impairment could result in decreased metabolism and increased systemic exposure to treprostinil potentiating the occurrence and/or severity of AEs and/or overdose. There are no adequate and well-controlled studies with Tyvaso in pregnant women. There are risks to the mother and the fetus associated with PAH. Patients who discontinue the use of any effective therapy may be expected to notice a decline in their clinical status. Patients who abruptly discontinue therapy (ie, miss 2 or more doses), or markedly reduce the dose, are at risk for worsening of PAH symptoms or a rebound in [facility] There is no evidence to suggest or suspect that patients taking inhaled treprostinil would be at risk for withdrawal symptoms or rebound effects.  Treprostinil inhibits platelet aggregation and increases the risk of bleeding, particularly with concomitant anticoagulants and antiplatelet therapies. Treprostinil is a pulmonary and systemic vasodilator. In  patients with low systemic arterial pressure, inhaled treprostinil solution may cause symptomatic hypotension, which may present as but is not limited to light-headedness, dizziness, blurred or faded vision, and syncope. Concomitant administration of treprostinil with diuretics, antihypertensive agents, or other vasodilators that may lower blood pressure increases the risk of symptomatic hypotension. Signs and symptoms of overdose with inhaled treprostinil solution may correspond to dose-limiting pharmacologic effects, including diarrhea, flushing, headache, hypotension, nausea, and vomiting. Most events are self-limiting and resolve with reduction or withholding of inhaled treprostinil solution.  Interactions:              Drug Interaction Studies Conducted with Treprostinil: Study # P01:08: Treprostinil formulation [place] Remodulin vs Acetaminophen, to evaluate effect of acetaminophen on Treprostinil pK, showed no effect. Study # P01:12: Treprostinil formulation [place] Remodulin vs Warfarin, to evaluate effect of Warfarin on Treprostinil pK/PD, showed no effect. Study TDE-PH-105: Treprostinil formulation Orenitram vs Bosentan, to evaluate effect of Bosentan on Treprostinil pK, showed no effect. Study TDE-PH-106: Treprostinil formulation Orenitram vs Sildenafil, to evaluate effect of Sildenafil on Treprostinil pK, showed no effect. Study TDE-PH-109: Treprostinil formulation Orenitram vs Rifampicin (CYP2C8/9 inducer), to evaluate the effect of inducing the HMC9O7 metabolic pathway on the pK of Treprostinil, confirmed metabolic pathway via SJG2E3/6. Expected interaction resulting in reduced Cmax and AUC. Study TDE-PH-110: Treprostinil formulation Orenitram vs Gemfibrozil (CYP2C8  inhibitor), to evaluate the effect of inhibiting the OQH4T6 metabolic pathway on the pK of Treprostinil, confirmed metabolic pathway primarily via CYP2C8. Expected interaction resulting in significantly increased Cmax and AUC. Study  TDE-PH-110: Treprostinil formulation Orenitram vs Fluconazole (CYP2C9 inhibitor), to evaluate the effect of inhibiting the LYY5K3 metabolic pathway on on the pK of Treprostinil, had no notable effect. Confirmed CYP2C9 plays a minor role in treprostinil metabolism.  Serious Adverse Reactions Observed in Clinical Studies with Inhaled  Treprostinil: Pulmonary Arterial Hypertension: all occurrences from RIN-PH-301 study for Pulmonary Arterial Hypertension. No serious adverse reactions met criteria for inclusion in inhaled treprostinil solution clinical studies for interstitial lung disease. System/Organ Class: Nervous System disorders - Syncope in 3 of 115 subjects exposed (2.6%)      Serious Adverse Reactions Observed Postmarketing: Gastrointestinal disorders: vomiting, nausea, diarrhea, GI Hemorrhage (reflects an aggregate of the following: GI hemorrhage, rectal hemorrhage, blood in stool [hematochezia or melena], and hematemesis). General disorders and administration site conditions: Pain Infections and infestations: Pneumonia Nervous system disorders: Syncope, dizziness, headache Respiratory, thoracic and mediastinal disorders: Hemoptysis, cough, epistaxis Vascular disorders: Hypotension, hemorrhage (unspecified blood loss ranging from nondescript to heavy or excessive bleeding).  Safety Data:  As of 08 August 2020, approximately 955 subjects have received treprostinil by inhalation across various studies, ranging from acute studies in healthy volunteers to long-term treatment in subjects with PAH and PH-ILD.   The estimate of worldwide exposure to treprostinil in the marketed inhaled formulations is approximately 47,425,956 total patient treatment-days (approximately 34,318 patient treatment-years). The most common adverse effects of inhaled treprostinil ?10% were cough, diarrhea, dizziness, flushing, headache, muscle/jaw/bone pain, nausea, pharyngolaryngeal pain, oropharyngeal pain, syncope, and  throat irritation. Other adverse reactions in the observational study comparing subjects taking inhaled treprostinil and a control group were cough, hemoptysis, nasal discomfort, and throat irritation. Angioedema is a rare but serious reaction that has been seen in the post-marketing setting with treprostinil.  Stability:  Ampoules of drug product are stable until the date indicated when stored in the unopened foil pouch. The [place] commercial labeling states the following regarding storage conditions: Store at [place] to [place] (49F to 50F), with excursions permitted to 38V to 56E (59F to 58F).  Once the foil pack is opened, ampoules should be used within 7 days. Because Tyvaso is light-sensitive, unopened ampoules should be stored in the foil pouch.   [facility] [place] Coordinator note :   This visit for Subject Elizabeth Espinoza with DOB: 04/28/1940 on 10/19/2021 for the above protocol is Visit/Encounter # Week 28 and is for purpose of research.   Protocol Version: Amendment 4 dated: 26Jul2023  IB: Version 18- [id number]  ICF: Amendment 1 Version 3.0 approved 03Aug2023    Subject expressed continued interest and consent in continuing as a study subject. Subject confirmed that there was no  change in contact information (e.g. address, telephone, email). Subject thanked for participation in research and contribution to science.  In this visit 10/19/2021 the subject will be evaluated by Principal Investigator  Brand Males, MD.  This research coordinator has verified that the above investigator is up to date with his/her training logs.   The Subject was informed that the PI continues to have oversight of the subject's visits and course  through relevant discussions, reviews and also specifically of this visit by routing of this note to the PI.   Subject is no longer on study drug but continuing the sessions of the study.  All assessment were performed according to protocol except  for those related to IP.  Improvement on AE's were documented and nothing new was added.  Next appointment was discussed and made for [id number].     Signed by  Jaye Beagle RN, MSN/MBA  Clinical Research Coordinator / Nurse [facility]  [place], [place] 1:02 PM 10/19/2021

## 2021-10-19 NOTE — Telephone Encounter (Signed)
Spoke with patient today.

## 2021-11-13 ENCOUNTER — Other Ambulatory Visit (INDEPENDENT_AMBULATORY_CARE_PROVIDER_SITE_OTHER): Payer: Medicare Other

## 2021-11-13 DIAGNOSIS — E039 Hypothyroidism, unspecified: Secondary | ICD-10-CM

## 2021-11-13 LAB — TSH: TSH: 0.74 u[IU]/mL (ref 0.35–5.50)

## 2021-11-14 ENCOUNTER — Other Ambulatory Visit (HOSPITAL_BASED_OUTPATIENT_CLINIC_OR_DEPARTMENT_OTHER): Payer: Self-pay

## 2021-11-14 MED ORDER — INFLUENZA VAC A&B SA ADJ QUAD 0.5 ML IM PRSY
PREFILLED_SYRINGE | INTRAMUSCULAR | 0 refills | Status: DC
Start: 2021-11-14 — End: 2022-04-30
  Filled 2021-11-14: qty 0.5, 1d supply, fill #0

## 2021-12-12 DIAGNOSIS — M5417 Radiculopathy, lumbosacral region: Secondary | ICD-10-CM | POA: Diagnosis not present

## 2021-12-26 ENCOUNTER — Other Ambulatory Visit (HOSPITAL_BASED_OUTPATIENT_CLINIC_OR_DEPARTMENT_OTHER): Payer: Self-pay

## 2021-12-26 MED ORDER — AREXVY 120 MCG/0.5ML IM SUSR
INTRAMUSCULAR | 0 refills | Status: DC
  Filled 2021-12-26: qty 0.5, 1d supply, fill #0

## 2021-12-29 DIAGNOSIS — M25571 Pain in right ankle and joints of right foot: Secondary | ICD-10-CM | POA: Diagnosis not present

## 2022-01-08 ENCOUNTER — Telehealth: Payer: Self-pay | Admitting: Internal Medicine

## 2022-01-08 ENCOUNTER — Other Ambulatory Visit: Payer: Self-pay

## 2022-01-08 MED ORDER — GABAPENTIN 100 MG PO CAPS: 100.0000 mg | ORAL_CAPSULE | Freq: Every day | ORAL | 3 refills | Status: DC

## 2022-01-08 NOTE — Telephone Encounter (Signed)
Sent in today

## 2022-01-08 NOTE — Telephone Encounter (Signed)
Caller & Relationship to patient: Self  Call back number: [phone]   Date of last office visit: 06.21.23  Date of next office visit: N/A  Medication(s) to be refilled:  gabapentin (NEURONTIN) 100 MG capsule    Preferred Pharmacy:   [id number]  Phone: [phone] Fax: [phone]

## 2022-01-10 ENCOUNTER — Encounter: Payer: Medicare Other | Admitting: *Deleted

## 2022-01-10 DIAGNOSIS — Z006 Encounter for examination for normal comparison and control in clinical research program: Secondary | ICD-10-CM

## 2022-01-10 DIAGNOSIS — J84112 Idiopathic pulmonary fibrosis: Secondary | ICD-10-CM

## 2022-01-10 NOTE — Research (Signed)
Title:A Randomized, Double-blind, Placebo-controlled, Phase 3 Study of the Efficacy and Safety of Inhaled Treprostinil in Subjects with Idiopathic Pulmonary Fibrosis   Dose and Duration of Treatment:Treprostinil for Inhalation 0.6 mg/mL, or placebo (randomly assigned 1:1) over a 52 week period.  Protocol # [id number]; IND # [id number]; Clinical Trials.gov Identifier: [id number]  Sponsor: [place], [place] [address]  Protocol Version: Amendment 4 dated: 26Jul2023 IB: Version 18- [id number] ICF: Ver. 3.0, 24 Aug 2021, WCG Ver. 3.1, 19 Oct 2021 Lab Manual: V4 dated 07Jun2023 Investigator Brochure Product:  Treprostinil for Inhalation, 0.6 mg/mL  Key Features of the Drug: Treprostinil (LRX-15, 15AU81, UT-15) is a stable tricyclic benzindene analogue of the naturally occurring prostacyclin (PGI2; epoprostenol), a member of the eicosanoid family of autocoids; these compounds occur widely in tissues and have important pharmacological properties with potent activity, especially on the cardiovascular system and smooth muscle ([place]). An inhalation solution of treprostinil, 0.6 mg/mL, delivered using an ultrasonic nebulizer, is currently FDA approved to treat pulmonary arterial hypertension (PAH); [WHO Group 1, PH-ILD; WHO Group 3. Inhaled treprostinil is NOW being evaluated as primary treatment of IPF, idiopathic pulmonary fibrosis.   Key Inclusion Criteria: ?80 years of age Diagnosis of IPF based on the 2018 ATS/ERS/JRS/ALAT Clinical Practice Guideline (FVC ?45% predicted at Screening).  Subjects on pirfenidone or nintedanib must be on a stable and optimized dose for ?30 days prior to Baseline. Concomitant use of both pirfenidone and nintedanib is not permitted. Males with a partner of childbearing potential must use a condom for the duration of treatment and for at least 48 hours after discontinuing study drug.   Key Exclusion Criteria: Subject is pregnant or  lactating.  FEV1/FVC <0.70 at Screening.  Prior intolerance or significant lack of efficacy to a prostacyclin or prostacyclin analogue that resulted in discontinuation or inability to effectively titrate that therapy.  The subject has received any PAH-approved therapy, including prostacyclin therapy (epoprostenol, treprostinil, iloprost, or beraprost; except for acute vasoreactivity testing), IP receptor agonists (selexipag), endothelin receptor antagonists, phosphodiesterase type 5 inhibitors (PDE5-Is), or soluble guanylate cyclase stimulators within 60 days prior to Baseline. As needed use of a PDE5-I for erectile dysfunction is permitted, provided no doses are taken within 48 hours of any study-related efficacy assessments.  Use of any of the following medications:  `     a. Azathioprine (AZA), cyclosporine, mycophenolate mofetil, tacroliumus, oral           corticosteroids (OCS) >20 mg/day or the combination of            OCS+AZA+N-acetylcysteine within 30 days prior to Baseline.        b. Cyclophosphamide within 60 days prior to Baseline        c. Rituximab within 6 months prior to Baseline   Receiving >10 L/min of oxygen at rest at Baseline.   Exacerbation of IPF or active pulmonary or upper respiratory infection within 30 days prior to Baseline. Subjects must have completed any antibiotic or steroid regimens for treatment of the infection or acute exacerbation more than 30 days prior to Baseline to be eligible. If hospitalized for an acute exacerbation of IPF or a pulmonary or upper respiratory infection, subjects must have been discharged more than 90 days prior to Baseline to be eligible.  Uncontrolled cardiac disease, defined as myocardial infarction within 6 months prior to Baseline or unstable angina within 30 days prior to Baseline. Use of any investigational drug/device or participation in any investigational study in which the subject received  a medical intervention (ie, procedure,  device, medication/supplement) within 30 days prior to Screening. Subjects participating in non-interventional, observational, or registry studies are eligible.  Life expectancy <6 months due to IPF or a concomitant illness.  Acute pulmonary embolism within 90 days prior to Baseline.   Pharmacodynamics:  (Dose-Response Relationships) - In a clinical study of 241 healthy volunteers, single doses of Tyvaso 54 ?g (the target maintenance dose per session) and 84 ?g (supratherapeutic inhalation dose) prolonged the QTc interval by approximately 10 ms. The QTc effect dissipated rapidly as the concentration of treprostinil decreased.  Pharmacokinetics: (ADME) - Treprostinil is substantially metabolized by the liver, primarily by CYP2C8.  The elimination of treprostinil (following [place] administration of treprostinil) is biphasic, with a terminal elimination t1?2 of approximately 4 hours.    Contraindications:   None  Special Warnings/Considerations: Treprostinil undergoes substantial hepatic metabolism; thus, liver impairment could result in decreased metabolism and increased systemic exposure to treprostinil potentiating the occurrence and/or severity of AEs and/or overdose. There are no adequate and well-controlled studies with Tyvaso in pregnant women. There are risks to the mother and the fetus associated with PAH. Patients who discontinue the use of any effective therapy may be expected to notice a decline in their clinical status. Patients who abruptly discontinue therapy (ie, miss 2 or more doses), or markedly reduce the dose, are at risk for worsening of PAH symptoms or a rebound in [facility]. There is no evidence to suggest or suspect that patients taking inhaled treprostinil would be at risk for withdrawal symptoms or rebound effects.  Treprostinil inhibits platelet aggregation and increases the risk of bleeding, particularly with concomitant anticoagulants and antiplatelet therapies. Treprostinil is a  pulmonary and systemic vasodilator. In patients with low systemic arterial pressure, inhaled treprostinil solution may cause symptomatic hypotension, which may present as but is not limited to light-headedness, dizziness, blurred or faded vision, and syncope. Concomitant administration of treprostinil with diuretics, antihypertensive agents, or other vasodilators that may lower blood pressure increases the risk of symptomatic hypotension. Signs and symptoms of overdose with inhaled treprostinil solution may correspond to dose-limiting pharmacologic effects, including diarrhea, flushing, headache, hypotension, nausea, and vomiting. Most events are self-limiting and resolve with reduction or withholding of inhaled treprostinil solution.  Interactions:              Drug Interaction Studies Conducted with Treprostinil: Study # P01:08: Treprostinil formulation [place] Remodulin vs Acetaminophen, to evaluate effect of acetaminophen on Treprostinil pK, showed no effect. Study # P01:12: Treprostinil formulation [place] Remodulin vs Warfarin, to evaluate effect of Warfarin on Treprostinil pK/PD, showed no effect. Study TDE-PH-105: Treprostinil formulation Orenitram vs Bosentan, to evaluate effect of Bosentan on Treprostinil pK, showed no effect. Study TDE-PH-106: Treprostinil formulation Orenitram vs Sildenafil, to evaluate effect of Sildenafil on Treprostinil pK, showed no effect. Study TDE-PH-109: Treprostinil formulation Orenitram vs Rifampicin (CYP2C8/9 inducer), to evaluate the effect of inducing the KVQ2V9 metabolic pathway on the pK of Treprostinil, confirmed metabolic pathway via DGL8V5/6. Expected interaction resulting in reduced Cmax and AUC. Study TDE-PH-110: Treprostinil formulation Orenitram vs Gemfibrozil (CYP2C8  inhibitor), to evaluate the effect of inhibiting the EPP2R5 metabolic pathway on the pK of Treprostinil, confirmed metabolic pathway primarily via CYP2C8. Expected interaction resulting in  significantly increased Cmax and AUC. Study TDE-PH-110: Treprostinil formulation Orenitram vs Fluconazole (CYP2C9 inhibitor), to evaluate the effect of inhibiting the JOA4Z6 metabolic pathway on on the pK of Treprostinil, had no notable effect. Confirmed CYP2C9 plays a minor role in treprostinil metabolism.  Serious Adverse Reactions  Observed in Clinical Studies with Inhaled Treprostinil: Pulmonary Arterial Hypertension: all occurrences from RIN-PH-301 study for Pulmonary Arterial Hypertension. No serious adverse reactions met criteria for inclusion in inhaled treprostinil solution clinical studies for interstitial lung disease. System/Organ Class: Nervous System disorders - Syncope in 3 of 115 subjects exposed (2.6%)      Serious Adverse Reactions Observed Postmarketing: Gastrointestinal disorders: vomiting, nausea, diarrhea, GI Hemorrhage (reflects an aggregate of the following: GI hemorrhage, rectal hemorrhage, blood in stool [hematochezia or melena], and hematemesis). General disorders and administration site conditions: Pain Infections and infestations: Pneumonia Nervous system disorders: Syncope, dizziness, headache Respiratory, thoracic and mediastinal disorders: Hemoptysis, cough, epistaxis Vascular disorders: Hypotension, hemorrhage (unspecified blood loss ranging from nondescript to heavy or excessive bleeding).  Safety Data:  As of 08 August 2020, approximately 955 subjects have received treprostinil by inhalation across various studies, ranging from acute studies in healthy volunteers to long-term treatment in subjects with PAH and PH-ILD.   The estimate of worldwide exposure to treprostinil in the marketed inhaled formulations is approximately 09,407,680 total patient treatment-days (approximately 34,318 patient treatment-years). The most common adverse effects of inhaled treprostinil ?10% were cough, diarrhea, dizziness, flushing, headache, muscle/jaw/bone pain, nausea, pharyngolaryngeal  pain, oropharyngeal pain, syncope, and throat irritation. Other adverse reactions in the observational study comparing subjects taking inhaled treprostinil and a control group were cough, hemoptysis, nasal discomfort, and throat irritation. Angioedema is a rare but serious reaction that has been seen in the post-marketing setting with treprostinil.  Stability:  Ampoules of drug product are stable until the date indicated when stored in the unopened foil pouch. The [place] commercial labeling states the following regarding storage conditions: Store at [place] to [place] (61F to 50F), with excursions permitted to 88P to 10R (61F to 48F).  Once the foil pack is opened, ampoules should be used within 7 days. Because Tyvaso is light-sensitive, unopened ampoules should be stored in the foil pouch.  [facility] [place] Coordinator note:   This visit for Subject Elizabeth Espinoza with DOB: 08-Nov-1940 on 01/10/2022 for the above protocol is Visit/Encounter # week 40  and is for purpose of research.   Protocol Version: Amendment 4 dated: 26Jul2023 IB: Version 18- [id number] ICF: Ver. 3.0, 24 Aug 2021, WCG Ver. 3.1, 19 Oct 2021 Lab Manual: V4 dated 07Jun2023  Subject expressed continued interest and consent in continuing as a study subject. Subject confirmed that there was no change in contact information (e.g. address, telephone, email). Subject thanked for participation in research and contribution to science. In this visit 01/10/2022 the subject is no longer on IP so this visit was vitals, labs, and spirometry. Subject did update the AE for the epidural shot in her back. She states the AE is subsided and it is not a medical history because the pain was worsened.  No other new medications or AEs to discuss. Week 52 was discussed and scheduled for subject.   The Subject was informed that the PI Dr. Brand Males continues to have oversight of the subject's visits and course through relevant  discussions, reviews, and also specifically of this visit by routing of this note to the PI.   Signed by  Jaye Beagle RN MSN/MBA  Clinical Research Coordinator / Nurse [facility]  [place], [place] 9:48 AM 01/10/2022

## 2022-01-12 ENCOUNTER — Telehealth: Payer: Self-pay | Admitting: Internal Medicine

## 2022-01-12 ENCOUNTER — Other Ambulatory Visit (INDEPENDENT_AMBULATORY_CARE_PROVIDER_SITE_OTHER): Payer: Medicare Other

## 2022-01-12 ENCOUNTER — Other Ambulatory Visit: Payer: Self-pay | Admitting: *Deleted

## 2022-01-12 DIAGNOSIS — E875 Hyperkalemia: Secondary | ICD-10-CM

## 2022-01-12 DIAGNOSIS — J84112 Idiopathic pulmonary fibrosis: Secondary | ICD-10-CM

## 2022-01-12 LAB — BASIC METABOLIC PANEL
BUN: 19 mg/dL (ref 6–23)
CO2: 32 mEq/L (ref 19–32)
Calcium: 8.9 mg/dL (ref 8.4–10.5)
Chloride: 102 mEq/L (ref 96–112)
Creatinine, Ser: 0.78 mg/dL (ref 0.40–1.20)
GFR: 71.59 mL/min (ref >60.00)
Glucose, Bld: 66 mg/dL — ABNORMAL LOW (ref 70–99)
Potassium: 4.4 mEq/L (ref 3.5–5.1)
Sodium: 139 mEq/L (ref 135–145)

## 2022-01-12 NOTE — Telephone Encounter (Signed)
Left message for patient to call back.

## 2022-01-12 NOTE — Telephone Encounter (Signed)
[facility] lab K ws 5.6. Suspect error but to be on safe side please have her come 01/12/2022  And do BMET

## 2022-01-12 NOTE — Telephone Encounter (Signed)
Called spoke with patient.  She said she will be here by 1545 for her lab draw.  Orders placed Nothing further needed at this time.

## 2022-01-25 ENCOUNTER — Other Ambulatory Visit: Payer: Self-pay | Admitting: Internal Medicine

## 2022-01-25 DIAGNOSIS — J84112 Idiopathic pulmonary fibrosis: Secondary | ICD-10-CM

## 2022-01-25 DIAGNOSIS — Z006 Encounter for examination for normal comparison and control in clinical research program: Secondary | ICD-10-CM

## 2022-01-29 ENCOUNTER — Ambulatory Visit: Payer: BLUE CROSS/BLUE SHIELD | Admitting: Internal Medicine

## 2022-01-29 ENCOUNTER — Encounter: Payer: Self-pay | Admitting: Internal Medicine

## 2022-01-29 VITALS — BP 112/64 | HR 83 | Ht 66.0 in | Wt 140.8 lb

## 2022-01-29 DIAGNOSIS — E039 Hypothyroidism, unspecified: Secondary | ICD-10-CM

## 2022-01-29 LAB — T4, FREE: Free T4: 1.11 ng/dL (ref 0.60–1.60)

## 2022-01-29 LAB — TSH: TSH: 0.55 u[IU]/mL (ref 0.35–5.50)

## 2022-01-29 NOTE — Patient Instructions (Addendum)
Please continue Synthroid 88 mcg daily.  Take the thyroid hormone every day, with water, at least 30 minutes before breakfast, separated by at least 4 hours from: - acid reflux medications - calcium - iron - multivitamins  Please stop at the lab.  Do not get thyroid tests within 1 month from a steroid injection.  Please return in 1 year.

## 2022-01-29 NOTE — Progress Notes (Signed)
Patient ID: Elizabeth Espinoza, female   DOB: 1940/10/25, 82 y.o.   MRN: [id number]  HPI  Elizabeth Espinoza is a 82 y.o.-year-old female, referred by her PCP, Dr. Quay Burow, for management of hypothyroidism.  Last visit 1 year ago.  Interim hx: She continues exercising -stationary bike, weights, walking 3 mi a day- [place].  She feels much better after she started exercising and her oxygen saturation improved. Since last OV, she had a low TSH at the time of her APE in 06/2021, related to med for IPF (she is part of a study). She was fatigued and had a lot of hair loss at that time. She stopped the study medication since and TFTs normalized and she started to feel better.  Reviewed history: Pt. was dx with hypothyroidism in 1994 >> she was started on Synthroid d.a.w. initially but had to switch to generic Levothyroxine due to price.  Now back on Synthroid brand name.  She is on Synthroid d.a.w.  88 mcg daily: - fasting, at 6 am - with water - separated by >2h from b'fast  - + calcium at night - no iron - stooped PPIs (Nexium) - + H2B (Pepcid)-later in the day, rarely - no multivitamins  - + Magnesium at night - + collagen >2h later - no Hair skin and nails  I reviewed pt's thyroid tests: Lab Results  Component Value Date   TSH 0.74 11/13/2021   TSH 0.95 08/24/2021   TSH 0.19 (L) 07/10/2021   TSH 0.43 02/06/2021   TSH 0.44 07/06/2020   TSH 1.06 05/12/2020   TSH 0.88 01/07/2020   TSH 0.41 11/09/2019   TSH 5.16 (H) 09/03/2019   TSH 10.19 (H) 07/06/2019   FREET4 1.16 08/24/2021   FREET4 1.14 02/06/2021   FREET4 1.08 06/25/2017   FREET4 0.67 04/23/2017   FREET4 0.93 10/26/2016   FREET4 1.30 06/13/2015   FREET4 1.58 05/16/2015   FREET4 0.90 02/23/2015   FREET4 1.16 01/21/2007   T3FREE 3.1 08/24/2021   T3FREE 2.9 06/13/2015   T3FREE 2.9 02/23/2015   Antithyroid antibodies were not elevated: Component     Latest Ref Rng & Units 10/26/2016  Thyroperoxidase Ab SerPl-aCnc     <9  IU/mL 1  Thyroglobulin Ab     < or = 1 IU/mL 1   Pt denies feeling nodules in neck, hoarseness, dysphagia/odynophagia.  She has + FH of thyroid disorders in: Paternal aunt. No FH of thyroid cancer.  No h/o radiation tx to head or neck. No recent use of iodine supplements.   No herbal supplements.   No recent contrast studies.   She is not on biotin.   Latest thyroid injection was in 11/2021.  She has a history of GERD, prediabetes, MVP, hiatal hernia, restless leg syndrome. She sees Dr. Collene Mares with GI. Pt. was dx'ed with IPF in 2021. She developed Barrett's esophagus after starting tx for this (Esbriet) - stopped. TSH was 10.19 at that time. 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  She has back pain.  On steroid injections. She has restless leg syndrome.  ROS: + see HPI  Past Medical History:  Diagnosis Date   Allergic rhinitis    GERD (gastroesophageal reflux disease)    Glaucoma    SUSPECT   Hiatal hernia    Hyperlipidemia    LDL goal = < 100   Hypothyroidism    ILD (interstitial lung disease) (HCC)    Lichen planopilaris    MVP (mitral valve prolapse)  documented on 2 D ECHO   Restless legs    Urticaria    Past Surgical History:  Procedure Laterality Date   APPENDECTOMY     with [place]   negative   COLONOSCOPY  2005   Dr Olevia Perches   COLONOSCOPY  01/2013   Dr Collene Mares   esophageal dilation  07/04/2011   Dr Estrella Myrtle SIGMOIDOSCOPY  1999    Dr Olevia Perches   KNEE ARTHROSCOPY  2001, 2005   Dr Percell Miller   bilat   TOTAL ABDOMINAL HYSTERECTOMY     for Endometriosis; no BSO, Dr Colin Ina   UPPER GI ENDOSCOPY  01/2013   Dr Collene Mares   VIDEO BRONCHOSCOPY Bilateral 02/05/2019   Procedure: VIDEO BRONCHOSCOPY WITH FLUORO;  Surgeon: Brand Males, MD;  Location: [facility];  Service: Endoscopy;  Laterality: Bilateral;   Social History   Socioeconomic History   Marital status: Married    Spouse name: Jeneen Rinks   Number of children: 2    Years of education: Not on file   Highest education level: Not on file  Occupational History   Occupation: retired from [employer] retired  Tobacco Use   Smoking status: Never   Smokeless tobacco: Never  Vaping Use   Vaping Use: Never used  Substance and Sexual Activity   Alcohol use: Yes    Comment:  rarely   Drug use: No   Sexual activity: Yes  Other Topics Concern   Not on file  Social History Narrative   Not on file   Social Determinants of Health   Financial Resource Strain: Low Risk  (10/20/2020)   Overall Financial Resource Strain (CARDIA)    Difficulty of Paying Living Expenses: Not hard at all  Food Insecurity: No [place] (10/20/2020)   Hunger Vital Sign    Worried About Running Out of Food in the Last Year: Never true    [place] in the Last Year: Never true  Transportation Needs: No Transportation Needs (10/20/2020)   PRAPARE - Hydrologist (Medical): No    Lack of Transportation (Non-Medical): No  Physical Activity: Sufficiently Active (10/20/2020)   Exercise Vital Sign    Days of Exercise per Week: 7 days    Minutes of Exercise per Session: 30 min  Stress: No Stress Concern Present (10/20/2020)   [place]    Feeling of Stress : Not at all  Social Connections: Moderately Isolated (10/20/2020)   Social Connection and Isolation Panel [NHANES]    Frequency of Communication with Friends and Family: More than three times a week    Frequency of Social Gatherings with Friends and Family: More than three times a week    Attends Religious Services: Never    Marine scientist or Organizations: No    Attends Archivist Meetings: Never    Marital Status: Married  Human resources officer Violence: Not At Risk (10/20/2020)   Humiliation, Afraid, Rape, and Kick questionnaire    Fear of Current or Ex-Partner: No    Emotionally Abused: No    Physically  Abused: No    Sexually Abused: No   Current Outpatient Medications on File Prior to Visit  Medication Sig Dispense Refill   aspirin EC 81 MG tablet Take 1 tablet (81 mg total) by mouth daily. 90 tablet 3   cholecalciferol (VITAMIN D3) 25 MCG (1000 UT) tablet Take 1,000 Units by mouth  daily.     clobetasol (TEMOVATE) 0.05 % external solution SMARTSIG:1 Milliliter(s) Topical Twice Daily     esomeprazole (NEXIUM) 40 MG capsule Take 40 mg by mouth every morning.     famotidine (PEPCID) 40 MG tablet Take 1 tablet (40 mg total) by mouth daily. 90 tablet 1   fluticasone (FLONASE) 50 MCG/ACT nasal spray Place 1 spray into both nostrils daily as needed for allergies or rhinitis.     gabapentin (NEURONTIN) 100 MG capsule Take 1-2 capsules (100-200 mg total) by mouth at bedtime. 60 capsule 3   hydroxychloroquine (PLAQUENIL) 200 MG tablet Take 200 mg by mouth 2 (two) times daily.     influenza vaccine adjuvanted (FLUAD) 0.5 ML injection Inject into the muscle. 0.5 mL 0   magnesium oxide (MAG-OX) 400 MG tablet Take 400 mg by mouth daily.     Multiple Vitamins-Minerals (ZINC PO) Take 1 tablet by mouth.     pravastatin (PRAVACHOL) 40 MG tablet TAKE 1 TABLET BY MOUTH  DAILY 100 tablet 2   Probiotic Product (PROBIOTIC ADVANCED) CAPS Take by mouth.     rOPINIRole (REQUIP) 0.5 MG tablet TAKE 2 TABLETS BY MOUTH  AFTER DINNER EACH NIGHT 180 tablet 3   RSV vaccine recomb adjuvanted (AREXVY) 120 MCG/0.5ML injection Inject into the muscle. 0.5 mL 0   SYNTHROID 88 MCG tablet Take 1 tablet by mouth 30 minutes before breakfast daily 90 tablet 3   vitamin B-12 (CYANOCOBALAMIN) 500 MCG tablet Take 500 mcg by mouth daily.     [DISCONTINUED] loratadine (CLARITIN) 10 MG tablet Take 10 mg by mouth daily as needed.       [DISCONTINUED] omeprazole (PRILOSEC) 40 MG capsule Take 1 capsule (40 mg total) by mouth daily. 90 capsule 0   No current facility-administered medications on file prior to visit.   Allergies  Allergen  Reactions   Caffeine     palpitations   Chlorpheniramine-Pseudoeph     palpitations   Maxifed     palpitations   Other Other (See Comments)   Pirfenidone Nausea Only    Esbriet Causes nausea and weakness   Family History  Problem Relation Age of Onset   Lung cancer Father        Asbestos with mesothelioma   Heart disease Paternal Grandmother        ? etiology   Diabetes Paternal Grandmother    Diabetes Brother    Uterine cancer Paternal Aunt    Stroke Paternal Grandfather        > 29   Heart attack Brother 4   Allergies Sister    Aneurysm Mother        congenital   Stroke Brother        two strokes   Barrett's esophagus Brother    PE: BP 112/64 (BP Location: Right Arm, Patient Position: Sitting, Cuff Size: Normal)   Pulse 83   Ht _0  (1.676 m)   Wt 140 lb 12.8 oz (63.9 kg)   SpO2 99%   BMI 22.73 kg/m  Wt Readings from Last 3 Encounters:  01/29/22 140 lb 12.8 oz (63.9 kg)  08/22/21 137 lb (62.1 kg)  07/12/21 137 lb 8 oz (62.4 kg)   Constitutional: normal weight, in NAD Eyes:  EOMI, no exophthalmos ENT: no neck masses, no cervical lymphadenopathy Cardiovascular: RRR, No MRG Respiratory: CTA B Musculoskeletal: no deformities Skin:no rashes Neurological: no tremor with outstretched hands  ASSESSMENT: 1. Hypothyroidism  PLAN:  1. Patient with long-standing hypothyroidism, on Synthroid therapy.  She is currently obtaining this from [facility], due to high cost through the regular pharmacy. - she had fairly well-controlled hypothyroidism in the past but became uncontrolled after starting treatment for IPF.  TFTs normalized again in the last few years.  However, she had another low TSH in 06/2021.  It is not clear whether she had a steroid injection before this check.  At that time, she had fatigue and hair loss and overall feeling poorly.  She attributed these to the IPF medication that she was using at that time as part of the study.  She was  taken off the medication since.  She remains a passive participant in the study. - latest thyroid labs reviewed with pt. >> normal: Lab Results  Component Value Date   TSH 0.74 11/13/2021  - she continues on Synthroid d.a.w. 88 mcg daily - pt feels good on this dose. - we discussed about taking the thyroid hormone every day, with water, >30 minutes before breakfast, separated by >4 hours from acid reflux medications, calcium, iron, multivitamins. Pt. is taking it correctly. - will check thyroid tests today: TSH and fT4 - we need to be conservative with dose changes for her due to previous variability in her TSH levels - If labs are abnormal, she will need to return for repeat TFTs in 1.5 months - OTW, I will see her back in a year  Component     Latest Ref Rng 01/29/2022  TSH     0.35 - 5.50 uIU/mL 0.55   T4,Free(Direct)     0.60 - 1.60 ng/dL 1.11   Thyroid tests are normal.  Philemon Kingdom, MD PhD [facility]

## 2022-01-30 ENCOUNTER — Other Ambulatory Visit: Payer: Self-pay | Admitting: Internal Medicine

## 2022-01-30 ENCOUNTER — Ambulatory Visit (HOSPITAL_BASED_OUTPATIENT_CLINIC_OR_DEPARTMENT_OTHER)
Admission: RE | Admit: 2022-01-30 | Discharge: 2022-01-30 | Disposition: A | Discharge status: Home / Self Care | Payer: BLUE CROSS/BLUE SHIELD | Source: Ambulatory Visit | Attending: Internal Medicine | Admitting: Internal Medicine

## 2022-01-30 ENCOUNTER — Encounter (INDEPENDENT_AMBULATORY_CARE_PROVIDER_SITE_OTHER): Payer: BLUE CROSS/BLUE SHIELD | Admitting: Internal Medicine

## 2022-01-30 DIAGNOSIS — Z006 Encounter for examination for normal comparison and control in clinical research program: Secondary | ICD-10-CM

## 2022-01-30 DIAGNOSIS — Z780 Asymptomatic menopausal state: Secondary | ICD-10-CM | POA: Diagnosis not present

## 2022-01-30 DIAGNOSIS — M47814 Spondylosis without myelopathy or radiculopathy, thoracic region: Secondary | ICD-10-CM | POA: Diagnosis not present

## 2022-01-30 DIAGNOSIS — J84112 Idiopathic pulmonary fibrosis: Secondary | ICD-10-CM

## 2022-01-30 DIAGNOSIS — M47816 Spondylosis without myelopathy or radiculopathy, lumbar region: Secondary | ICD-10-CM | POA: Diagnosis not present

## 2022-01-30 LAB — CALCIUM: Calcium: 9.3 mg/dL (ref 8.4–10.5)

## 2022-01-30 LAB — ALKALINE PHOSPHATASE: Alkaline Phosphatase: 74 U/L (ref 39–117)

## 2022-01-30 LAB — VITAMIN D 25 HYDROXY (VIT D DEFICIENCY, FRACTURES): VITD: 27.07 ng/mL — ABNORMAL LOW (ref 30.00–100.00)

## 2022-01-30 LAB — PHOSPHORUS: Phosphorus: 3.3 mg/dL (ref 2.3–4.6)

## 2022-01-30 NOTE — Research (Signed)
[place]:  An Exploratory Assessment of Bone Disease in Idiopathic Pulmonary Fibrosis    Primary Endpoint  The primary objectives of this exploratory study are to broadly estimate: the prevalence and severity of osteopenia and osteoporosis in those with IPF, and the prevalence of vertebral fragility fracture(s) in those with IPF.  Study Code: [id number] Protocol Edition: Number 2.0, Date 20 September 2021 Sponsor Name: [employer] Primary Investigator: Dr. Brand Males  Protocol Version for 01/30/2022 date of 20 Sep 2021   Consent Version for 01/30/2022 date of 13 Dec 2021     Key Feature of Study: This is an exploratory study to broadly assess bone disease in those with IPF. Participants are expected to attend one visit consisting of informed consent, medical history, lab blood draw, lateral thoracic and lumbar x-rays, and a DXA bone density scan. X-rays and DXA scan may be done on the same day as screening visit or within 7 days after screening. Study participation is concluded once visit, labs, and radiologic studies completed.  Key Inclusion Criteria:   1. Female or female participants aged 81 years or older with mild-to-moderate IPF (defined as FVC >= 45% predicted at most recent measurement). 2.. Negative pregnancy test (high sensitivity urine pregnancy test) for female participants prior to imaging.  Key Exclusion Criteria: 1. Non-IPF interstitial lung disease. 2. (FEV1))/FVC ratio <0.70. 3. Active enrollment or recent participation (ie, within 3 months or less than 5 half-lives of investigational medical product whichever is longer) in an interventional clinical trial where participants received an investigational medical product or placebo. 4. Current systemic corticosteroid treatment. 5. Prior history of hip, thoracic spine, or lumbar spine surgery if residual hardware remains within these areas. 6. Prior history of single or bilateral lung transplantation. 7. Current or prior  history of a hematologic malignancy or metastatic cancer. 8. Nuclear medicine study within the past 1 week or an X-ray procedure using contrast within the past 72 hours. 9. Individuals who have had calcium supplements within 24 hours of the dual X-ray absorptiometry (DXA) scan. Note: Individuals using calcium supplements are not excluded if they temporarily discontinue their calcium 24 hours prior to their DXA scan. Calcium supplements may be resumed following their DXA scan. 63. Women who are pregnant.  Safety Data: Not Applicable.  No interventional product given.   [facility] [place] Coordinator note:   This visit for Subject Elizabeth Espinoza with DOB: [birth year] on 01/30/2022 for the above protocol is Visit/Encounter # 1  and is for purpose of research.   The consent for this encounter is under Protocol Version 2.0,  Consent Version 1 and is currently IRB approved.   Subject expressed continued interest and consent in continuing as a study subject.  Subject thanked for participation in research and contribution to science. In this visit 01/30/2022 the subject will be evaluated by  Sub-Investigator named Dr. Unice Cobble. This research coordinator has verified that the above investigator is up to date with his/her training logs.   The Subject was informed that the PI  continues to have oversight of the subject's visits and course through relevant discussions, reviews, and also specifically of this visit by routing of this note to the PI.      This visit is a key visit of  screening. The PI is NOT available for this visit.   Because the PI is NOT available, the Sub-Investigator reported and CRC has confirmed that the PI has discussed the visit a-priori with the Sub-Investigator.  Please  see subject binder for additional information.  Signed by  [place] Coordinator  [facility]  [place], [place] 12:15 PM 01/30/2022

## 2022-02-04 ENCOUNTER — Telehealth: Payer: Self-pay | Admitting: Internal Medicine

## 2022-02-04 NOTE — Telephone Encounter (Signed)
   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  Vit D - low 27  Plan  = she should talk to PCP

## 2022-02-05 NOTE — Telephone Encounter (Signed)
Called and spoke with patient. She verbalized understanding. I will send a copy of her lab and scans to her PCP.   Nothing further needed at time of call.

## 2022-02-05 NOTE — Telephone Encounter (Signed)
Attempted to call pt but unable to reach. Left message to return call.

## 2022-02-07 ENCOUNTER — Other Ambulatory Visit (HOSPITAL_BASED_OUTPATIENT_CLINIC_OR_DEPARTMENT_OTHER): Payer: Self-pay

## 2022-02-07 MED ORDER — HYDROXYCHLOROQUINE SULFATE 200 MG PO TABS
200.0000 mg | ORAL_TABLET | Freq: Two times a day (BID) | ORAL | 9 refills | Status: AC
  Filled 2022-02-07: qty 60, 30d supply, fill #0

## 2022-02-08 ENCOUNTER — Other Ambulatory Visit (HOSPITAL_BASED_OUTPATIENT_CLINIC_OR_DEPARTMENT_OTHER): Payer: Self-pay

## 2022-02-08 DIAGNOSIS — H04123 Dry eye syndrome of bilateral lacrimal glands: Secondary | ICD-10-CM | POA: Diagnosis not present

## 2022-02-08 DIAGNOSIS — Z79899 Other long term (current) drug therapy: Secondary | ICD-10-CM | POA: Diagnosis not present

## 2022-02-08 DIAGNOSIS — L438 Other lichen planus: Secondary | ICD-10-CM | POA: Diagnosis not present

## 2022-02-08 DIAGNOSIS — H40013 Open angle with borderline findings, low risk, bilateral: Secondary | ICD-10-CM | POA: Diagnosis not present

## 2022-02-15 ENCOUNTER — Telehealth: Payer: Self-pay | Admitting: Pulmonary Disease

## 2022-02-15 MED ORDER — ROPINIROLE HCL 0.5 MG PO TABS: ORAL_TABLET | ORAL | 3 refills | Status: DC

## 2022-02-15 NOTE — Telephone Encounter (Signed)
Patient would like to get a refill on her requip - please just send a 30 day supply to [facility].  Next visit in June, 2024  Last visit:  June, 2023

## 2022-03-01 ENCOUNTER — Telehealth: Payer: Self-pay

## 2022-03-01 NOTE — Telephone Encounter (Signed)
Left message for patient to call back to schedule Medicare Annual Wellness Visit   Last AWV  10/20/20  Please schedule at anytime with [facility] if patient calls the office back.    30 Minutes appointment   Any questions, please call me at [phone]

## 2022-03-06 DIAGNOSIS — K080 Exfoliation of teeth due to systemic causes: Secondary | ICD-10-CM | POA: Diagnosis not present

## 2022-03-13 ENCOUNTER — Telehealth: Payer: Self-pay

## 2022-03-13 NOTE — Telephone Encounter (Signed)
Contacted Elizabeth Espinoza to schedule their annual wellness visit. Appointment made for 03/27/22.  [place], [place] ([facility])  [place] [facility] [phone]

## 2022-03-14 ENCOUNTER — Other Ambulatory Visit: Payer: Self-pay

## 2022-03-14 ENCOUNTER — Encounter: Payer: Self-pay | Admitting: Internal Medicine

## 2022-03-14 MED ORDER — GABAPENTIN 100 MG PO CAPS: 100.0000 mg | ORAL_CAPSULE | Freq: Every day | ORAL | 2 refills | Status: DC

## 2022-03-15 ENCOUNTER — Other Ambulatory Visit: Payer: Self-pay

## 2022-03-15 ENCOUNTER — Encounter: Payer: Self-pay | Admitting: Internal Medicine

## 2022-03-15 MED ORDER — PRAVASTATIN SODIUM 40 MG PO TABS: 40.0000 mg | ORAL_TABLET | Freq: Every day | ORAL | 2 refills | Status: DC

## 2022-03-27 ENCOUNTER — Ambulatory Visit (INDEPENDENT_AMBULATORY_CARE_PROVIDER_SITE_OTHER): Payer: Medicare Other

## 2022-03-27 VITALS — BP 117/70 | HR 70 | Temp 99.0°F | Ht 66.0 in | Wt 136.0 lb

## 2022-03-27 DIAGNOSIS — Z0000 Encounter for general adult medical examination without abnormal findings: Secondary | ICD-10-CM

## 2022-03-27 NOTE — Progress Notes (Addendum)
I connected with  Elizabeth Espinoza on 03/27/2022 at 3:00 p.m. EST by telephone and verified that I am speaking with the correct person using two identifiers.  Location: Patient: Home Provider: [place] Persons participating in the virtual visit: [place]   I discussed the limitations, risks, security and privacy concerns of performing an evaluation and management service by telephone and the availability of in person appointments. The patient expressed understanding and agreed to proceed.  Interactive audio and video telecommunications were attempted between this nurse and patient, however failed, due to patient having technical difficulties OR patient did not have access to video capability.  We continued and completed visit with audio only.  Some vital signs may be absent or patient reported.   Sheral Flow, LPN  Subjective:   Elizabeth Espinoza is a 82 y.o. female who presents for Medicare Annual (Subsequent) preventive examination.  Patient Medicare AWV questionnaire was completed by the patient on 03/26/2022; I have confirmed that all information answered by patient is correct and no changes since this date.    Review of Systems     Cardiac Risk Factors include: advanced age (>58mn, >>39women);dyslipidemia;family history of premature cardiovascular disease     Objective:    Today's Vitals   03/27/22 1507 03/27/22 1546  BP: 117/70   Pulse: 70   Temp: 99 F (37.2 C)   TempSrc: Temporal   SpO2: 93%   Weight: 136 lb (61.7 kg)   Height: '5\' 6"'$  (1.676 m)   PainSc: 0-No pain 0-No pain   Body mass index is 21.95 kg/m.     03/27/2022    3:49 PM 10/20/2020   11:33 AM 11/06/2019    2:10 PM 07/09/2019   10:57 AM 02/05/2019   12:31 PM 11/12/2018   10:21 AM 07/03/2018    1:19 PM  Advanced Directives  Does Patient Have a Medical Advance Directive? Yes Yes Yes Yes Yes No;Yes Yes  Type of AParamed[place]of [place]St. [place] will Living  will;Healthcare Power of Attorney Living will  Living will [place] will [place] will  Does patient want to make changes to medical advance directive?  No - Patient declined  No - Patient declined     Copy of HThayerin Chart? No - copy requested No - copy requested    No - copy requested No - copy requested    Current Medications (verified) Outpatient Encounter Medications as of 03/27/2022  Medication Sig   aspirin EC 81 MG tablet Take 1 tablet (81 mg total) by mouth daily.   cholecalciferol (VITAMIN D3) 25 MCG (1000 UT) tablet Take 1,000 Units by mouth daily.   clobetasol (TEMOVATE) 0.05 % external solution SMARTSIG:1 Milliliter(s) Topical Twice Daily   esomeprazole (NEXIUM) 40 MG capsule Take 40 mg by mouth every morning.   famotidine (PEPCID) 40 MG tablet Take 1 tablet (40 mg total) by mouth daily.   fluticasone (FLONASE) 50 MCG/ACT nasal spray Place 1 spray into both nostrils daily as needed for allergies or rhinitis.   gabapentin (NEURONTIN) 100 MG capsule Take 1-2 capsules (100-200 mg total) by mouth at bedtime.   hydroxychloroquine (PLAQUENIL) 200 MG tablet Take 1 tablet by mouth twice a day   influenza vaccine adjuvanted (FLUAD) 0.5 ML injection Inject into the muscle.   magnesium oxide (MAG-OX) 400 MG tablet Take 400 mg by mouth daily.   Multiple Vitamins-Minerals (ZINC PO) Take 1 tablet by mouth.   pravastatin (  PRAVACHOL) 40 MG tablet Take 1 tablet (40 mg total) by mouth daily.   Probiotic Product (PROBIOTIC ADVANCED) CAPS Take by mouth.   rOPINIRole (REQUIP) 0.5 MG tablet TAKE 2 TABLETS BY MOUTH  AFTER DINNER EACH NIGHT   RSV vaccine recomb adjuvanted (AREXVY) 120 MCG/0.5ML injection Inject into the muscle.   SYNTHROID 88 MCG tablet Take 1 tablet by mouth 30 minutes before breakfast daily   vitamin B-12 (CYANOCOBALAMIN) 500 MCG tablet Take 500 mcg by mouth daily.   [DISCONTINUED] hydroxychloroquine  (PLAQUENIL) 200 MG tablet Take 200 mg by mouth 2 (two) times daily.   [DISCONTINUED] loratadine (CLARITIN) 10 MG tablet Take 10 mg by mouth daily as needed.     [DISCONTINUED] omeprazole (PRILOSEC) 40 MG capsule Take 1 capsule (40 mg total) by mouth daily.   No facility-administered encounter medications on file as of 03/27/2022.    Allergies (verified) Caffeine, Chlorpheniramine-pseudoeph, Maxifed, Other, and Pirfenidone   History: Past Medical History:  Diagnosis Date   Allergic rhinitis    GERD (gastroesophageal reflux disease)    Glaucoma    SUSPECT   Hiatal hernia    Hyperlipidemia    LDL goal = < 100   Hypothyroidism    ILD (interstitial lung disease) (HCC)    Lichen planopilaris    MVP (mitral valve prolapse)    documented on 2 D ECHO   Restless legs    Urticaria    Past Surgical History:  Procedure Laterality Date   APPENDECTOMY     with [place]   negative   COLONOSCOPY  2005   Dr Olevia Perches   COLONOSCOPY  01/2013   Dr Collene Mares   esophageal dilation  07/04/2011   Dr Estrella Myrtle SIGMOIDOSCOPY  1999    Dr Olevia Perches   KNEE ARTHROSCOPY  2001, 2005   Dr Percell Miller   bilat   TOTAL ABDOMINAL HYSTERECTOMY     for Endometriosis; no BSO, Dr Colin Ina   UPPER GI ENDOSCOPY  01/2013   Dr Collene Mares   VIDEO BRONCHOSCOPY Bilateral 02/05/2019   Procedure: VIDEO BRONCHOSCOPY WITH FLUORO;  Surgeon: Brand Males, MD;  Location[facility];  Service: Endoscopy;  Laterality: Bilateral;   Family History  Problem Relation Age of Onset   Lung cancer Father        Asbestos with mesothelioma   Heart disease Paternal Grandmother        ? etiology   Diabetes Paternal Grandmother    Diabetes Brother    Uterine cancer Paternal Aunt    Stroke Paternal Grandfather        > 44   Heart attack Brother 36   Allergies Sister    Aneurysm Mother        congenital   Stroke Brother        two strokes   Barrett's esophagus Brother    Social History    Socioeconomic History   Marital status: Married    Spouse name: Jeneen Rinks   Number of children: 2   Years of education: Not on file   Highest education level: Not on file  Occupational History   Occupation: retired from [employer] retired  Tobacco Use   Smoking status: Never   Smokeless tobacco: Never  Vaping Use   Vaping Use: Never used  Substance and Sexual Activity   Alcohol use: Yes    Comment:  rarely   Drug use: No   Sexual activity: Yes  Other Topics Concern   Not on  file  Social History Narrative   Not on file   Social Determinants of Health   Financial Resource Strain: Low Risk  (03/27/2022)   Overall Financial Resource Strain (CARDIA)    Difficulty of Paying Living Expenses: Not hard at all  Food Insecurity: No Food Insecurity (03/27/2022)   Hunger Vital Sign    Worried About Running Out of Food in the Last Year: Never true    Ran Out of Food in the Last Year: Never true  Transportation Needs: No Transportation Needs (03/27/2022)   PRAPARE - Hydrologist (Medical): No    Lack of Transportation (Non-Medical): No  Physical Activity: Sufficiently Active (03/27/2022)   Exercise Vital Sign    Days of Exercise per Week: 6 days    Minutes of Exercise per Session: 50 min  Stress: No Stress Concern Present (03/27/2022)   [place]    Feeling of Stress : Not at all  Social Connections: Moderately Isolated (03/27/2022)   Social Connection and Isolation Panel [NHANES]    Frequency of Communication with Friends and Family: More than three times a week    Frequency of Social Gatherings with Friends and Family: More than three times a week    Attends Religious Services: Never    Marine scientist or Organizations: No    Attends Music therapist: Never    Marital Status: Married    Tobacco Counseling Counseling given: Not Answered   Clinical Intake:  Pre-visit  preparation completed: Yes  Pain : No/denies pain Pain Score: 0-No pain     BMI - recorded: 21.95 Nutritional Status: BMI of 19-24  Normal Nutritional Risks: None Diabetes: No  How often do you need to have someone help you when you read instructions, pamphlets, or other written materials from your doctor or pharmacy?: 1 - Never What is the last grade level you completed in school?: HSG  Diabetic? No  Interpreter Needed?: No  Information entered by :: Lisette Abu, LPN.   Activities of Daily Living    03/27/2022    3:50 PM 03/26/2022    9:32 PM  In your present state of health, do you have any difficulty performing the following activities:  Hearing? 0 0  Vision? 0 0  Difficulty concentrating or making decisions? 0   Walking or climbing stairs? 0   Dressing or bathing? 0 0  Doing errands, shopping? 0 0  Preparing Food and eating ? N N  Using the Toilet? N N  In the past six months, have you accidently leaked urine? N N  Do you have problems with loss of bowel control? N N  Managing your Medications? N N  Managing your Finances? N N  Housekeeping or managing your Housekeeping? N     Patient Care Team: Binnie Rail, MD as PCP - General (Internal Medicine) Stanford Breed Denice Bors, MD as Consulting Physician (Cardiology) Collene Gobble, MD as Consulting Physician (Pulmonary Disease) Charlton Haws, [facility] as Pharmacist (Pharmacist) Melida Quitter, MD as Consulting Physician (Otolaryngology) [facility] as Consulting Physician (Ophthalmology)  Indicate any recent Medical Services you may have received from other than [facility] providers in the past year (date may be approximate).     Assessment:   This is a routine wellness examination for Elizabeth Espinoza.  Hearing/Vision screen Hearing Screening - Comments:: Patient has hearing difficulty and wears hearing aids. Vision Screening - Comments:: Patient does wear corrective lenses/contacts.  [facility] exam done by:  [facility]   Dietary issues and exercise activities discussed: Current Exercise Habits: Home exercise routine, Type of exercise: walking;Other - see comments (vigor exercises), Time (Minutes): 50, Frequency (Times/Week): 6, Weekly Exercise (Minutes/Week): 300, Intensity: Moderate, Exercise limited by: respiratory conditions(s)   Goals Addressed             This Visit's Progress    Client understands the importance of follow-up with providers by attending scheduled visits.       My goal for 2024 is to breathe better.      Depression Screen    03/27/2022    3:47 PM 03/21/2021    2:40 PM 10/20/2020   11:31 AM 01/07/2020   10:21 AM 01/07/2020   10:20 AM 11/06/2019    2:06 PM 11/06/2019    2:05 PM  PHQ 2/9 Scores  PHQ - 2 Score 0 0 0 0 0 0 0  PHQ- 9 Score 0   2  6     Fall Risk    03/27/2022    3:50 PM 03/26/2022    9:32 PM 03/21/2021    2:40 PM 10/20/2020   11:41 AM 11/06/2019    2:04 PM  Fall Risk   Falls in the past year? 0 0 0 0 0  Number falls in past yr: 0  0 0 0  Injury with Fall? 0  0 0   Risk for fall due to : No Fall Risks  No Fall Risks No Fall Risks   Follow up Falls prevention discussed  Falls evaluation completed Falls evaluation completed Falls evaluation completed;Education provided;Falls prevention discussed;Follow up appointment    FALL RISK PREVENTION PERTAINING TO THE HOME:  Any stairs in or around the home? Yes  If so, are there any without handrails? No  Home free of loose throw rugs in walkways, pet beds, electrical cords, etc? Yes  Adequate lighting in your home to reduce risk of falls? Yes   ASSISTIVE DEVICES UTILIZED TO PREVENT FALLS:  Life alert? No  Use of a cane, walker or w/c? No  Grab bars in the bathroom? Yes  Shower chair or bench in shower? No  Elevated toilet seat or a handicapped toilet? Yes   TIMED UP AND GO:  Was the test performed? Yes .  Length of time to ambulate 10 feet: 8 sec.   Gait steady and fast without use of  assistive device  Cognitive Function:        03/27/2022    3:51 PM 07/09/2019   11:03 AM  6CIT Screen  What Year? 0 points 0 points  What month? 0 points 0 points  What time? 0 points 0 points  Count back from 20 0 points 0 points  Months in reverse 0 points 0 points  Repeat phrase 0 points 0 points  Total Score 0 points 0 points    Immunizations Immunization History  Administered Date(s) Administered   Fluad Quad(high Dose 65+) 11/15/2020, 11/14/2021   Influenza Split 11/07/2010   Influenza Whole 10/30/2009, 11/27/2011   Influenza, High Dose Seasonal PF 10/27/2012, 11/05/2013, 10/01/2017, 09/11/2018, 11/23/2019   Influenza-Unspecified 11/24/2014, 10/07/2015, 10/08/2016   PFIZER(Purple Top)SARS-COV-2 Vaccination 03/14/2019, 04/06/2019, 10/27/2019   Pneumococcal Conjugate-13 10/07/2015   Pneumococcal Polysaccharide-23 07/02/2017   Respiratory Syncytial Virus Vaccine,Recomb Aduvanted(Arexvy) 12/27/2021   Tdap 02/11/2012   Zoster Recombinat (Shingrix) 10/01/2017, 12/03/2017   Zoster, Live 01/09/2012    TDAP status: Due, Education has been provided regarding the importance of this vaccine. Advised  may receive this vaccine at local pharmacy or Health Dept. Aware to provide a copy of the vaccination record if obtained from local pharmacy or Health Dept. Verbalized acceptance and understanding.  Flu Vaccine status: Up to date  Pneumococcal vaccine status: Up to date  Covid-19 vaccine status: Completed vaccines  Qualifies for Shingles Vaccine? Yes   Zostavax completed Yes   Shingrix Completed?: Yes  Screening Tests Health Maintenance  Topic Date Due   COVID-19 Vaccine (4 - 2023-24 season) 09/22/2021   DTaP/Tdap/Td (2 - Td or Tdap) 02/10/2022   Medicare Annual Wellness (AWV)  03/27/2023   DEXA SCAN  01/31/2027   Pneumonia Vaccine 82+ Years old  Completed   INFLUENZA VACCINE  Completed   Zoster Vaccines- Shingrix  Completed   HPV VACCINES  Aged Out    Health  Maintenance  Health Maintenance Due  Topic Date Due   COVID-19 Vaccine (4 - 2023-24 season) 09/22/2021   DTaP/Tdap/Td (2 - Td or Tdap) 02/10/2022    Colorectal cancer screening: No longer required.   Mammogram status: Completed 10/10/2021. Repeat every year  Bone Density status: Completed 01/30/2022. Results reflect: Bone density results: NORMAL. Repeat every 5 years.  Lung Cancer Screening: (Low Dose CT Chest recommended if Age 37-80 years, 30 pack-year currently smoking OR have quit w/in 15years.) does not qualify.   Lung Cancer Screening Referral: no  Additional Screening:  Hepatitis C Screening: does not qualify; Completed no  Vision Screening: Recommended annual ophthalmology exams for early detection of glaucoma and other disorders of the eye. Is the patient up to date with their annual eye exam?  Yes  Who is the provider or what is the name of the office in which the patient attends annual eye exams? [facility] If pt is not established with a provider, would they like to be referred to a provider to establish care? No .   Dental Screening: Recommended annual dental exams for proper oral hygiene  Community Resource Referral / Chronic Care Management: CRR required this visit?  No   CCM required this visit?  No      Plan:     I have personally reviewed and noted the following in the patient's chart:   Medical and social history Use of alcohol, tobacco or illicit drugs  Current medications and supplements including opioid prescriptions. Patient is not currently taking opioid prescriptions. Functional ability and status Nutritional status Physical activity Advanced directives List of other physicians Hospitalizations, surgeries, and ER visits in previous 12 months Vitals Screenings to include cognitive, depression, and falls Referrals and appointments  In addition, I have reviewed and discussed with patient certain preventive protocols, quality metrics, and  best practice recommendations. A written personalized care plan for preventive services as well as general preventive health recommendations were provided to patient.     Sheral Flow, LPN   [id number]   Nurse Notes:  Normal cognitive status assessed by direct observation by this Nurse Health Advisor. No abnormalities found.   Patient Medicare AWV questionnaire was completed by the patient on 03/26/2022; I have confirmed that all information answered by patient is correct and no changes since this date.

## 2022-03-27 NOTE — Patient Instructions (Signed)
Elizabeth Espinoza , Thank you for taking time to come for your Medicare Wellness Visit. I appreciate your ongoing commitment to your health goals. Please review the following plan we discussed and let me know if I can assist you in the future.   These are the goals we discussed:  Goals      Client understands the importance of follow-up with providers by attending scheduled visits.     My goal for 2024 is to breathe better.        This is a list of the screening recommended for you and due dates:  Health Maintenance  Topic Date Due   COVID-19 Vaccine (4 - 2023-24 season) 09/22/2021   Medicare Annual Wellness Visit  10/20/2021   DTaP/Tdap/Td vaccine (2 - Td or Tdap) 02/10/2022   DEXA scan (bone density measurement)  01/31/2027   Pneumonia Vaccine  Completed   Flu Shot  Completed   Zoster (Shingles) Vaccine  Completed   HPV Vaccine  Aged Out    Advanced directives: Yes  Conditions/risks identified: Yes  Next appointment: Follow up in one year for your annual wellness visit.   Preventive Care 81 Years and Older, Female Preventive care refers to lifestyle choices and visits with your health care provider that can promote health and wellness. What does preventive care include? A yearly physical exam. This is also called an annual well check. Dental exams once or twice a year. Routine eye exams. Ask your health care provider how often you should have your eyes checked. Personal lifestyle choices, including: Daily care of your teeth and gums. Regular physical activity. Eating a healthy diet. Avoiding tobacco and drug use. Limiting alcohol use. Practicing safe sex. Taking low-dose aspirin every day. Taking vitamin and mineral supplements as recommended by your health care provider. What happens during an annual well check? The services and screenings done by your health care provider during your annual well check will depend on your age, overall health, lifestyle risk factors, and  family history of disease. Counseling  Your health care provider may ask you questions about your: Alcohol use. Tobacco use. Drug use. Emotional well-being. Home and relationship well-being. Sexual activity. Eating habits. History of falls. Memory and ability to understand (cognition). Work and work Statistician. Reproductive health. Screening  You may have the following tests or measurements: Height, weight, and BMI. Blood pressure. Lipid and cholesterol levels. These may be checked every 5 years, or more frequently if you are over 77 years old. Skin check. Lung cancer screening. You may have this screening every year starting at age 26 if you have a 30-pack-year history of smoking and currently smoke or have quit within the past 15 years. Fecal occult blood test (FOBT) of the stool. You may have this test every year starting at age 63. Flexible sigmoidoscopy or colonoscopy. You may have a sigmoidoscopy every 5 years or a colonoscopy every 10 years starting at age 21. Hepatitis C blood test. Hepatitis B blood test. Sexually transmitted disease (STD) testing. Diabetes screening. This is done by checking your blood sugar (glucose) after you have not eaten for a while (fasting). You may have this done every 1-3 years. Bone density scan. This is done to screen for osteoporosis. You may have this done starting at age 61. Mammogram. This may be done every 1-2 years. Talk to your health care provider about how often you should have regular mammograms. Talk with your health care provider about your test results, treatment options, and if necessary, the  need for more tests. Vaccines  Your health care provider may recommend certain vaccines, such as: Influenza vaccine. This is recommended every year. Tetanus, diphtheria, and acellular pertussis (Tdap, Td) vaccine. You may need a Td booster every 10 years. Zoster vaccine. You may need this after age 49. Pneumococcal 13-valent conjugate  (PCV13) vaccine. One dose is recommended after age 62. Pneumococcal polysaccharide (PPSV23) vaccine. One dose is recommended after age 20. Talk to your health care provider about which screenings and vaccines you need and how often you need them. This information is not intended to replace advice given to you by your health care provider. Make sure you discuss any questions you have with your health care provider. Document Released: 02/04/2015 Document Revised: 09/28/2015 Document Reviewed: 11/09/2014 Elsevier Interactive Patient Education  2017 [place] Prevention in the Home Falls can cause injuries. They can happen to people of all ages. There are many things you can do to make your home safe and to help prevent falls. What can I do on the outside of my home? Regularly fix the edges of walkways and driveways and fix any cracks. Remove anything that might make you trip as you walk through a door, such as a raised step or threshold. Trim any bushes or trees on the path to your home. Use bright outdoor lighting. Clear any walking paths of anything that might make someone trip, such as rocks or tools. Regularly check to see if handrails are loose or broken. Make sure that both sides of any steps have handrails. Any raised decks and porches should have guardrails on the edges. Have any leaves, snow, or ice cleared regularly. Use sand or salt on walking paths during winter. Clean up any spills in your garage right away. This includes oil or grease spills. What can I do in the bathroom? Use night lights. Install grab bars by the toilet and in the tub and shower. Do not use towel bars as grab bars. Use non-skid mats or decals in the tub or shower. If you need to sit down in the shower, use a plastic, non-slip stool. Keep the floor dry. Clean up any water that spills on the floor as soon as it happens. Remove soap buildup in the tub or shower regularly. Attach bath mats securely with  double-sided non-slip rug tape. Do not have throw rugs and other things on the floor that can make you trip. What can I do in the bedroom? Use night lights. Make sure that you have a light by your bed that is easy to reach. Do not use any sheets or blankets that are too big for your bed. They should not hang down onto the floor. Have a firm chair that has side arms. You can use this for support while you get dressed. Do not have throw rugs and other things on the floor that can make you trip. What can I do in the kitchen? Clean up any spills right away. Avoid walking on wet floors. Keep items that you use a lot in easy-to-reach places. If you need to reach something above you, use a strong step stool that has a grab bar. Keep electrical cords out of the way. Do not use floor polish or wax that makes floors slippery. If you must use wax, use non-skid floor wax. Do not have throw rugs and other things on the floor that can make you trip. What can I do with my stairs? Do not leave any items on the  stairs. Make sure that there are handrails on both sides of the stairs and use them. Fix handrails that are broken or loose. Make sure that handrails are as long as the stairways. Check any carpeting to make sure that it is firmly attached to the stairs. Fix any carpet that is loose or worn. Avoid having throw rugs at the top or bottom of the stairs. If you do have throw rugs, attach them to the floor with carpet tape. Make sure that you have a light switch at the top of the stairs and the bottom of the stairs. If you do not have them, ask someone to add them for you. What else can I do to help prevent falls? Wear shoes that: Do not have high heels. Have rubber bottoms. Are comfortable and fit you well. Are closed at the toe. Do not wear sandals. If you use a stepladder: Make sure that it is fully opened. Do not climb a closed stepladder. Make sure that both sides of the stepladder are locked  into place. Ask someone to hold it for you, if possible. Clearly mark and make sure that you can see: Any grab bars or handrails. First and last steps. Where the edge of each step is. Use tools that help you move around (mobility aids) if they are needed. These include: Canes. Walkers. Scooters. Crutches. Turn on the lights when you go into a dark area. Replace any light bulbs as soon as they burn out. Set up your furniture so you have a clear path. Avoid moving your furniture around. If any of your floors are uneven, fix them. If there are any pets around you, be aware of where they are. Review your medicines with your doctor. Some medicines can make you feel dizzy. This can increase your chance of falling. Ask your doctor what other things that you can do to help prevent falls. This information is not intended to replace advice given to you by your health care provider. Make sure you discuss any questions you have with your health care provider. Document Released: 11/04/2008 Document Revised: 06/16/2015 Document Reviewed: 02/12/2014 Elsevier Interactive Patient Education  2017 [employer].

## 2022-04-02 NOTE — Addendum Note (Signed)
Addended by: Sheral Flow on: 04/02/2022 03:44 PM   Modules accepted: Level of Service

## 2022-04-04 ENCOUNTER — Encounter: Payer: BLUE CROSS/BLUE SHIELD | Admitting: *Deleted

## 2022-04-04 ENCOUNTER — Encounter (INDEPENDENT_AMBULATORY_CARE_PROVIDER_SITE_OTHER): Payer: BLUE CROSS/BLUE SHIELD | Admitting: Internal Medicine

## 2022-04-04 DIAGNOSIS — Z006 Encounter for examination for normal comparison and control in clinical research program: Secondary | ICD-10-CM

## 2022-04-04 DIAGNOSIS — J84112 Idiopathic pulmonary fibrosis: Secondary | ICD-10-CM

## 2022-04-04 NOTE — Research (Signed)
Title:A Randomized, Double-blind, Placebo-controlled, Phase 3 Study of the Efficacy and Safety of Inhaled Treprostinil in Subjects with Idiopathic Pulmonary Fibrosis   Dose and Duration of Treatment:Treprostinil for Inhalation 0.6 mg/mL, or placebo (randomly assigned 1:1) over a 52 week period.  Protocol # [id number]-301; IND # [id number]; Clinical Trials.gov Identifier: [id number]  Sponsor: [place], [place] [id number]  Protocol Version: Amendment 4 dated: 26Jul2023 IB: Version 18- [id number] ICF: Ver. 3.0, 24 Aug 2021, WCG Ver. 3.1, 19 Oct 2021 Lab Manual: V4 dated 07Jun2023  Investigator Brochure Product:  Treprostinil for Inhalation, 0.6 mg/mL  Key Features of the Drug: Treprostinil (LRX-15, 15AU81, UT-15) is a stable tricyclic benzindene analogue of the naturally occurring prostacyclin (PGI2; epoprostenol), a member of the eicosanoid family of autocoids; these compounds occur widely in tissues and have important pharmacological properties with potent activity, especially on the cardiovascular system and smooth muscle ([place]). An inhalation solution of treprostinil, 0.6 mg/mL, delivered using an ultrasonic nebulizer, is currently FDA approved to treat pulmonary arterial hypertension (PAH); [WHO Group 1, PH-ILD; WHO Group 3. Inhaled treprostinil is NOW being evaluated as primary treatment of IPF, idiopathic pulmonary fibrosis.   Key Inclusion Criteria: ?81 years of age Diagnosis of IPF based on the 2018 ATS/ERS/JRS/ALAT Clinical Practice Guideline (FVC ?45% predicted at Screening).  Subjects on pirfenidone or nintedanib must be on a stable and optimized dose for ?30 days prior to Baseline. Concomitant use of both pirfenidone and nintedanib is not permitted. Males with a partner of childbearing potential must use a condom for the duration of treatment and for at least 48 hours after discontinuing study drug.   Key Exclusion Criteria: Subject is pregnant or  lactating.  FEV1/FVC <0.70 at Screening.  Prior intolerance or significant lack of efficacy to a prostacyclin or prostacyclin analogue that resulted in discontinuation or inability to effectively titrate that therapy.  The subject has received any PAH-approved therapy, including prostacyclin therapy (epoprostenol, treprostinil, iloprost, or beraprost; except for acute vasoreactivity testing), IP receptor agonists (selexipag), endothelin receptor antagonists, phosphodiesterase type 5 inhibitors (PDE5-Is), or soluble guanylate cyclase stimulators within 60 days prior to Baseline. As needed use of a PDE5-I for erectile dysfunction is permitted, provided no doses are taken within 48 hours of any study-related efficacy assessments.  Use of any of the following medications:  `     a. Azathioprine (AZA), cyclosporine, mycophenolate mofetil, tacroliumus, oral           corticosteroids (OCS) >20 mg/day or the combination of            OCS+AZA+N-acetylcysteine within 30 days prior to Baseline.        b. Cyclophosphamide within 60 days prior to Baseline        c. Rituximab within 6 months prior to Baseline   Receiving >10 L/min of oxygen at rest at Baseline.   Exacerbation of IPF or active pulmonary or upper respiratory infection within 30 days prior to Baseline. Subjects must have completed any antibiotic or steroid regimens for treatment of the infection or acute exacerbation more than 30 days prior to Baseline to be eligible. If hospitalized for an acute exacerbation of IPF or a pulmonary or upper respiratory infection, subjects must have been discharged more than 90 days prior to Baseline to be eligible.  Uncontrolled cardiac disease, defined as myocardial infarction within 6 months prior to Baseline or unstable angina within 30 days prior to Baseline. Use of any investigational drug/device or participation in any investigational study in which the subject  received a medical intervention (ie, procedure,  device, medication/supplement) within 30 days prior to Screening. Subjects participating in non-interventional, observational, or registry studies are eligible.  Life expectancy <6 months due to IPF or a concomitant illness.  Acute pulmonary embolism within 90 days prior to Baseline.   Pharmacodynamics:  (Dose-Response Relationships) - In a clinical study of 241 healthy volunteers, single doses of Tyvaso 54 ?g (the target maintenance dose per session) and 84 ?g (supratherapeutic inhalation dose) prolonged the QTc interval by approximately 10 ms. The QTc effect dissipated rapidly as the concentration of treprostinil decreased.  Pharmacokinetics: (ADME) - Treprostinil is substantially metabolized by the liver, primarily by CYP2C8.  The elimination of treprostinil (following [place] administration of treprostinil) is biphasic, with a terminal elimination t1?2 of approximately 4 hours.    Contraindications:   None  Special Warnings/Considerations: Treprostinil undergoes substantial hepatic metabolism; thus, liver impairment could result in decreased metabolism and increased systemic exposure to treprostinil potentiating the occurrence and/or severity of AEs and/or overdose. There are no adequate and well-controlled studies with Tyvaso in pregnant women. There are risks to the mother and the fetus associated with PAH. Patients who discontinue the use of any effective therapy may be expected to notice a decline in their clinical status. Patients who abruptly discontinue therapy (ie, miss 2 or more doses), or markedly reduce the dose, are at risk for worsening of PAH symptoms or a rebound in [facility]. There is no evidence to suggest or suspect that patients taking inhaled treprostinil would be at risk for withdrawal symptoms or rebound effects.  Treprostinil inhibits platelet aggregation and increases the risk of bleeding, particularly with concomitant anticoagulants and antiplatelet therapies. Treprostinil is a  pulmonary and systemic vasodilator. In patients with low systemic arterial pressure, inhaled treprostinil solution may cause symptomatic hypotension, which may present as but is not limited to light-headedness, dizziness, blurred or faded vision, and syncope. Concomitant administration of treprostinil with diuretics, antihypertensive agents, or other vasodilators that may lower blood pressure increases the risk of symptomatic hypotension. Signs and symptoms of overdose with inhaled treprostinil solution may correspond to dose-limiting pharmacologic effects, including diarrhea, flushing, headache, hypotension, nausea, and vomiting. Most events are self-limiting and resolve with reduction or withholding of inhaled treprostinil solution.  Interactions:              Drug Interaction Studies Conducted with Treprostinil: Study # P01:08: Treprostinil formulation [place] Remodulin vs Acetaminophen, to evaluate effect of acetaminophen on Treprostinil pK, showed no effect. Study # P01:12: Treprostinil formulation [place] Remodulin vs Warfarin, to evaluate effect of Warfarin on Treprostinil pK/PD, showed no effect. Study TDE-PH-105: Treprostinil formulation Orenitram vs Bosentan, to evaluate effect of Bosentan on Treprostinil pK, showed no effect. Study TDE-PH-106: Treprostinil formulation Orenitram vs Sildenafil, to evaluate effect of Sildenafil on Treprostinil pK, showed no effect. Study TDE-PH-109: Treprostinil formulation Orenitram vs Rifampicin (CYP2C8/9 inducer), to evaluate the effect of inducing the Q000111Q metabolic pathway on the pK of Treprostinil, confirmed metabolic pathway via [id number]. Expected interaction resulting in reduced Cmax and AUC. Study TDE-PH-110: Treprostinil formulation Orenitram vs Gemfibrozil (CYP2C8  inhibitor), to evaluate the effect of inhibiting the Q000111Q metabolic pathway on the pK of Treprostinil, confirmed metabolic pathway primarily via CYP2C8. Expected interaction resulting in  significantly increased Cmax and AUC. Study TDE-PH-110: Treprostinil formulation Orenitram vs Fluconazole (CYP2C9 inhibitor), to evaluate the effect of inhibiting the 0000000 metabolic pathway on on the pK of Treprostinil, had no notable effect. Confirmed CYP2C9 plays a minor role in treprostinil metabolism.  Serious Adverse  Reactions Observed in Clinical Studies with Inhaled Treprostinil: Pulmonary Arterial Hypertension: all occurrences from RIN-PH-301 study for Pulmonary Arterial Hypertension. No serious adverse reactions met criteria for inclusion in inhaled treprostinil solution clinical studies for interstitial lung disease. System/Organ Class: Nervous System disorders - Syncope in 3 of 115 subjects exposed (2.6%)      Serious Adverse Reactions Observed Postmarketing: Gastrointestinal disorders: vomiting, nausea, diarrhea, GI Hemorrhage (reflects an aggregate of the following: GI hemorrhage, rectal hemorrhage, blood in stool [hematochezia or melena], and hematemesis). General disorders and administration site conditions: Pain Infections and infestations: Pneumonia Nervous system disorders: Syncope, dizziness, headache Respiratory, thoracic and mediastinal disorders: Hemoptysis, cough, epistaxis Vascular disorders: Hypotension, hemorrhage (unspecified blood loss ranging from nondescript to heavy or excessive bleeding).  Safety Data:  As of 08 August 2020, approximately 955 subjects have received treprostinil by inhalation across various studies, ranging from acute studies in healthy volunteers to long-term treatment in subjects with PAH and PH-ILD.   The estimate of worldwide exposure to treprostinil in the marketed inhaled formulations is approximately [id number] total patient treatment-days (approximately 34,318 patient treatment-years). The most common adverse effects of inhaled treprostinil ?10% were cough, diarrhea, dizziness, flushing, headache, muscle/jaw/bone pain, nausea, pharyngolaryngeal  pain, oropharyngeal pain, syncope, and throat irritation. Other adverse reactions in the observational study comparing subjects taking inhaled treprostinil and a control group were cough, hemoptysis, nasal discomfort, and throat irritation. Angioedema is a rare but serious reaction that has been seen in the post-marketing setting with treprostinil.  Stability:  Ampoules of drug product are stable until the date indicated when stored in the unopened foil pouch. The [place] commercial labeling states the following regarding storage conditions: Store at [place] to [place] (36F to 38F), with excursions permitted to [id number] to [id number] (24F to 63F).  Once the foil pack is opened, ampoules should be used within 7 days. Because Tyvaso is light-sensitive, unopened ampoules should be stored in the foil pouch.  [facility] [place] Coordinator note:   This visit for Subject Elizabeth Espinoza with DOB: [birth year] on 04/04/2022 for the above protocol is Visit/Encounter # week 52 end of study  and is for purpose of research.   Protocol Version: Amendment 4 dated: 26Jul2023 IB: Version 18- [id number] ICF: Ver. 3.0, 24 Aug 2021, WCG Ver. 3.1, 19 Oct 2021 Lab Manual: V4 dated 07Jun2023   Subject expressed continued interest and consent in continuing as a study subject. Subject confirmed that there was no change in contact information (e.g. address, telephone, email). Subject thanked for participation in research and contribution to science. In this visit 04/04/2022 the subject will be evaluated by Principal Investigator named Lutricia Horsfall, MD. This research coordinator has verified that the above investigator is up to date with his/her training logs.   This visit is a key visit of end of study. The PI is available for this visit.  Subject came to visit and completed the assessments for week 52 per protocol. Subject will not be rolling over into the OLE due to AE's previously during the blinded portion of the  study.  Further details to today's visit can be found in the subject study binder.   Signed by  Jaye Beagle RN MSN/MBA  Clinical Research Coordinator / Nurse [facility]  [place], [place] 9:16 AM 04/04/2022

## 2022-04-30 NOTE — Progress Notes (Unsigned)
    Subjective:    Patient ID: Elizabeth Espinoza, female    DOB: 06-28-1940, 82 y.o.   MRN: [id number]      HPI [place] is here for No chief complaint on file.        Medications and allergies reviewed with patient and updated if appropriate.  Current Outpatient Medications on File Prior to Visit  Medication Sig Dispense Refill   aspirin EC 81 MG tablet Take 1 tablet (81 mg total) by mouth daily. 90 tablet 3   cholecalciferol (VITAMIN D3) 25 MCG (1000 UT) tablet Take 1,000 Units by mouth daily.     clobetasol (TEMOVATE) 0.05 % external solution SMARTSIG:1 Milliliter(s) Topical Twice Daily     esomeprazole (NEXIUM) 40 MG capsule Take 40 mg by mouth every morning.     famotidine (PEPCID) 40 MG tablet Take 1 tablet (40 mg total) by mouth daily. 90 tablet 1   fluticasone (FLONASE) 50 MCG/ACT nasal spray Place 1 spray into both nostrils daily as needed for allergies or rhinitis.     gabapentin (NEURONTIN) 100 MG capsule Take 1-2 capsules (100-200 mg total) by mouth at bedtime. 120 capsule 2   hydroxychloroquine (PLAQUENIL) 200 MG tablet Take 1 tablet by mouth twice a day 60 tablet 9   influenza vaccine adjuvanted (FLUAD) 0.5 ML injection Inject into the muscle. 0.5 mL 0   magnesium oxide (MAG-OX) 400 MG tablet Take 400 mg by mouth daily.     Multiple Vitamins-Minerals (ZINC PO) Take 1 tablet by mouth.     pravastatin (PRAVACHOL) 40 MG tablet Take 1 tablet (40 mg total) by mouth daily. 90 tablet 2   Probiotic Product (PROBIOTIC ADVANCED) CAPS Take by mouth.     rOPINIRole (REQUIP) 0.5 MG tablet TAKE 2 TABLETS BY MOUTH  AFTER DINNER EACH NIGHT 60 tablet 3   RSV vaccine recomb adjuvanted (AREXVY) 120 MCG/0.5ML injection Inject into the muscle. 0.5 mL 0   SYNTHROID 88 MCG tablet Take 1 tablet by mouth 30 minutes before breakfast daily 90 tablet 3   vitamin B-12 (CYANOCOBALAMIN) 500 MCG tablet Take 500 mcg by mouth daily.     [DISCONTINUED] loratadine (CLARITIN) 10 MG tablet Take 10 mg by  mouth daily as needed.       [DISCONTINUED] omeprazole (PRILOSEC) 40 MG capsule Take 1 capsule (40 mg total) by mouth daily. 90 capsule 0   No current facility-administered medications on file prior to visit.    Review of Systems     Objective:  There were no vitals filed for this visit. BP Readings from Last 3 Encounters:  03/27/22 117/70  01/29/22 112/64  08/22/21 126/80   Wt Readings from Last 3 Encounters:  03/27/22 136 lb (61.7 kg)  01/29/22 140 lb 12.8 oz (63.9 kg)  08/22/21 137 lb (62.1 kg)   There is no height or weight on file to calculate BMI.    Physical Exam         Assessment & Plan:    See Problem List for Assessment and Plan of chronic medical problems.

## 2022-04-30 NOTE — Patient Instructions (Signed)
      Blood work was ordered.   The lab is on the first floor.    Medications changes include :   none    A referral was ordered for vascular surgery.     Someone will call you to schedule an appointment.

## 2022-05-01 ENCOUNTER — Ambulatory Visit (INDEPENDENT_AMBULATORY_CARE_PROVIDER_SITE_OTHER): Payer: Medicare Other | Admitting: Internal Medicine

## 2022-05-01 ENCOUNTER — Encounter: Payer: Self-pay | Admitting: Internal Medicine

## 2022-05-01 VITALS — BP 122/78 | HR 73 | Temp 97.7°F | Ht 66.0 in | Wt 137.0 lb

## 2022-05-01 DIAGNOSIS — R631 Polydipsia: Secondary | ICD-10-CM | POA: Diagnosis not present

## 2022-05-01 DIAGNOSIS — I739 Peripheral vascular disease, unspecified: Secondary | ICD-10-CM | POA: Insufficient documentation

## 2022-05-01 DIAGNOSIS — R7303 Prediabetes: Secondary | ICD-10-CM | POA: Diagnosis not present

## 2022-05-01 DIAGNOSIS — G2581 Restless legs syndrome: Secondary | ICD-10-CM | POA: Diagnosis not present

## 2022-05-01 DIAGNOSIS — Z8639 Personal history of other endocrine, nutritional and metabolic disease: Secondary | ICD-10-CM

## 2022-05-01 LAB — CBC WITH DIFFERENTIAL/PLATELET
Basophils Absolute: 0.1 10*3/uL (ref 0.0–0.1)
Basophils Relative: 0.9 % (ref 0.0–3.0)
Eosinophils Absolute: 0.3 10*3/uL (ref 0.0–0.7)
Eosinophils Relative: 3.1 % (ref 0.0–5.0)
HCT: 41 % (ref 36.0–46.0)
Hemoglobin: 13.6 g/dL (ref 12.0–15.0)
Lymphocytes Relative: 32 % (ref 12.0–46.0)
Lymphs Abs: 2.9 10*3/uL (ref 0.7–4.0)
MCHC: 33.2 g/dL (ref 30.0–36.0)
MCV: 89.7 fl (ref 78.0–100.0)
Monocytes Absolute: 0.7 10*3/uL (ref 0.1–1.0)
Monocytes Relative: 7.5 % (ref 3.0–12.0)
Neutro Abs: 5.2 10*3/uL (ref 1.4–7.7)
Neutrophils Relative %: 56.5 % (ref 43.0–77.0)
Platelets: 231 10*3/uL (ref 150.0–400.0)
RBC: 4.56 Mil/uL (ref 3.87–5.11)
RDW: 14.9 % (ref 11.5–15.5)
WBC: 9.2 10*3/uL (ref 4.0–10.5)

## 2022-05-01 LAB — COMPREHENSIVE METABOLIC PANEL
ALT: 12 U/L (ref 0–35)
AST: 20 U/L (ref 0–37)
Albumin: 4.5 g/dL (ref 3.5–5.2)
Alkaline Phosphatase: 85 U/L (ref 39–117)
BUN: 17 mg/dL (ref 6–23)
CO2: 25 mEq/L (ref 19–32)
Calcium: 9.7 mg/dL (ref 8.4–10.5)
Chloride: 105 mEq/L (ref 96–112)
Creatinine, Ser: 0.77 mg/dL (ref 0.40–1.20)
GFR: 72.55 mL/min (ref >60.00)
Glucose, Bld: 86 mg/dL (ref 70–99)
Potassium: 4.3 mEq/L (ref 3.5–5.1)
Sodium: 140 mEq/L (ref 135–145)
Total Bilirubin: 0.6 mg/dL (ref 0.2–1.2)
Total Protein: 7.2 g/dL (ref 6.0–8.3)

## 2022-05-01 LAB — FERRITIN: Ferritin: 34.6 ng/mL (ref 10.0–291.0)

## 2022-05-01 LAB — IBC PANEL
Iron: 66 ug/dL (ref 42–145)
Saturation Ratios: 17 % — ABNORMAL LOW (ref 20.0–50.0)
TIBC: 389.2 ug/dL (ref 250.0–450.0)
Transferrin: 278 mg/dL (ref 212.0–360.0)

## 2022-05-01 LAB — HEMOGLOBIN A1C: Hgb A1c MFr Bld: 5.8 % (ref 4.6–6.5)

## 2022-05-01 NOTE — Assessment & Plan Note (Signed)
Chronic Check a1c Low sugar / carb diet Stressed regular exercise  

## 2022-05-01 NOTE — Assessment & Plan Note (Addendum)
Chronic Not ideally controlled Continue requip1 mg at dinner Gabapentin 100-200 mg HS Check cbc, iron panel

## 2022-05-01 NOTE — Assessment & Plan Note (Signed)
H/o iron def Has RLS Will check cbc, iron panel

## 2022-05-01 NOTE — Assessment & Plan Note (Signed)
Acute Has increased thirst x few weeks No obvious cause Doubt it is related to sugars A1c, cbc, cmp

## 2022-05-01 NOTE — Assessment & Plan Note (Signed)
?   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

## 2022-05-04 ENCOUNTER — Ambulatory Visit: Payer: Medicare Other | Admitting: Internal Medicine

## 2022-05-04 ENCOUNTER — Encounter: Payer: Self-pay | Admitting: Internal Medicine

## 2022-05-04 VITALS — BP 108/70 | Temp 97.6°F | Ht 66.0 in | Wt 137.0 lb

## 2022-05-04 DIAGNOSIS — J84112 Idiopathic pulmonary fibrosis: Secondary | ICD-10-CM | POA: Diagnosis not present

## 2022-05-04 NOTE — Patient Instructions (Addendum)
ICD-10-CM   1. 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

## 2022-05-04 NOTE — Progress Notes (Addendum)
IOV 12/04/2017: Dr Lamonte Sakai: [name] is a 82 year old never smoker with a history of allergic rhinitis, GERD, hypothyroidism, MVP.  She is been seen in our office in the past by Dr. Gwenette Greet for restless leg syndrome-she still treated for this with Requip. On allegra, nexium prn.   She is referred today for dyspnea.  She reports that she has had an exercise routine where she walked w her husband 2 miles, exercises at the [facility], for several years. She has noticed more SOB, especially with mild hills. Sometimes now has to stop briefly to complete the walk. Occasional chest tightness, no wheeze. No overt CP. She reports daily cough, often in the am, sometimes clear mucous. She is on allegra. She had a walking stress test 09/2014 > reassuring ECG w exercise. Weight has been stable. Her last TSH was 06/25/17, 0.73.   Review of her notes shows hx possible GGI on prior CT 2014.   ROV 02/26/18 [name] --82 year old never smoker with a history of mitral valve prolapse whom I saw earlier this year for exertional dyspnea and chronic cough.  Pulmonary function testing consistent with restriction.  She has GERD and allergic rhinitis both of which could be contributing to her chronic cough.  I performed a high-resolution CT scan of the chest on 1/20 and reviewed today, this shows some patchy confluent subpleural reticular disease and groundglass attenuation with some minimal traction bronchiectasis and no frank honeycomb change.  This reflects a progression compared with 2014 and is an NSIP pattern.  Walking oximetry at her last visit did not show any exertional desaturation. She is still coughing, is using fexofenadine, takes nexium qod.   [place], had pesticide exposure.   ROV 08/26/2018 [name] --follow-up visit for 82 year old woman with a history of mitral valve prolapse, chronic cough, dyspnea with restrictive lung disease noted on pulmonary function testing.  High Res CT scan of the chest  shows some mild interstitial disease.  She was also having cough - better with flonase; still on nexium, allegra. Her exertional tolerance is improved, she has been exercising more. She remains on Requip for RLS.   We performed autoimmune labs last time, ANA positive at low titer (1: 80), SSA and SSB negative, RNP negative, RF negative, CCP negative, Anti-Smith negative, anti-SCL negative, DS DNA negative, anti-Jo negative, aldolase negative. She hasn't been seen in ILD clinic yet.    OV 10/08/2018  Subjective:  Patient ID: Elizabeth Espinoza, female , DOB: 02/12/1940 , age 82 y.o. , MRN: [id number] , ADDRESS: [place] [address]   10/08/2018 -   Chief Complaint  Patient presents with   Interstitial Lung Disease    Breathing the same as it was during August 2020 office visit with Dr. Lamonte Sakai     HPI Elizabeth Espinoza 82 y.o. -has been referred to the interstitial lung disease clinic because of findings of interstitial lung disease.  History is gathered from talking to her, review of Dr. Collene Gobble notes and also the integrated ILD questionnaire.  Briefly, she tells me that she was working out at [employer] and would just notice occasional dyspnea but she really did not compare it with other people.  Then in October 2019 she started walking 3 miles daily except on the days it rains with a husband.  During this time she noticed that she was falling really behind because of shortness of breath and exertional fatigue.  This resulted  in subsequent evaluation all documented above.  Findings of interstitial lung disease with subpleural reticulation suggestive of an alternative diagnosis.  Autoimmune profile essentially negative other than trace positive ANA.  She tells me that her significant major problem is exertional dyspnea when walking stairs or walking several miles.  She did not desaturate in our office several months ago.  She does not know if she desaturates when she exerts  walking 3 miles.  Pulmonary function test earlier this year is just isolated low DLCO.  She also has like a cough.  Overall since the onset of the symptoms by exercising herself more and conditioning she is somewhat better.  She did see Dr. Kirk Ruths in May 2020 and in June 2020 had a coronary calcium CT which appears to have no calcium deposits.   [place] Integrated Comprehensive ILD Questionnaire  Symptoms:  -Dyspnea started suddenly and since it started it is better.  She says it is been present for years although she did tell me that it is only there since October 2019 when she noticed that.  Severity is listed below.  She does have a cough almost 1 year.  Since it started it is better.  It is mostly in the morning.  She does bring up some phlegm.  Early on in the morning it is green or yellow.  Since it started it is the same/better.  There is no wheezing.  She does have some chest tightness with this when she walks.  It is relieved by rest.  Cardiac work-up in June 2020 showed no calcium deposits.   Past Medical History : Positive for chronic longstanding acid reflux disease and thyroid disease..  In addition CT scan from January 2020 shows hiatal hernia that is small..  This presence of  sclerosis in the bony structures in January 2020.-Primary care physician has been sent a message today.  There is no asthma or COPD or heart failure rheumatoid arthritis or collagen vascular disease.  She does have GERD and hiatal hernia for several years to decades.  No sleep apnea.  No blood clots.  No hepatitis.  No tuberculosis.  No pleurisy.  , ANA positive at low titer (1: 80), SSA and SSB negative, RNP negative, RF negative, CCP negative, Anti-Smith negative, anti-SCL negative, DS DNA negative, anti-Jo negative, aldolase negative. She hasn't been seen in ILD clinic yet.    ROS:  -She does have fatigue for the last several years.  She does have some back and hip issues.  She does have dry eyes.  She  does have like some dysphagia.  There is presence of hiatal hernia-there is a small.  Acid reflux for several decades.  She also reports presence of nonspecific rash   FAMILY HISTORY of LUNG DISEASE: * -Her father died of mesothelioma in nineteen 83/1984 at the age of 72 otherwise no lung disease.   EXPOSURE HISTORY:   -When she was 17 she smokes cigarettes but otherwise no cigarette or tobacco use or electronic cigarette.  Never smoked marijuana.  No cocaine use no intravenous drug use.   HOME and HOBBY DETAILS :  -Single[place]family home [place] setting for the last 16 years in a 81 year old home.  No mold or mildew exposure in the [facility] duct or CPAP mask or humidifier.  No mold or mildew in the bathroom.  No pet birds in the house.  No misting [place].  No feather pillows no feather duvet.  No musical instruments.  She does some occasional gardening which  she likes.  She does do some fine-needle work.   OCCUPATIONAL HISTORY (122 questions) :  = Essentially negative except for the fact when she was a child she did some tobacco growing.  She has done home gardening for 50 years.    PULMONARY TOXICITY HISTORY (27 items):  denies  Results for KATHLYNE, LOUD (MRN [id number]) as of 11/07/2018 11:20  Ref. Range 02/26/2018 12:17  Anti Nuclear Antibody (ANA) Latest Ref Range: NEGATIVE  POSITIVE (A)  ANA Pattern 1 Unknown Nuclear, Speckled (A)  ANA Titer 1 Latest Units: titer 1:80 (H)  Anti JO-1 Latest Ref Range: 0.0 - 0.9 AI <4.6  Cyclic Citrullin Peptide Ab Latest Units: UNITS <16  ds DNA Ab Latest Units: IU/mL <1  RA Latex Turbid. Latest Ref Range: <14 IU/mL <14  ENA SM Ab Ser-aCnc Latest Ref Range: <1.0 NEG AI <1.0 NEG  Ribonucleic Protein(ENA) Antibody, IgG Latest Ref Range: <1.0 NEG AI <1.0 NEG  SSA (Ro) (ENA) Antibody, IgG Latest Ref Range: <1.0 NEG AI <1.0 NEG  SSB (La) (ENA) Antibody, IgG Latest Ref Range: <1.0 NEG AI <1.0 NEG  Scleroderma (Scl-70) (ENA) Antibody, IgG Latest Ref Range: <1.0  NEG AI <1.0 NEG   Results for LUCIE, FRIEDLANDER (MRN [id number]) as of 11/07/2018 11:20  Ref. Range 01/29/2018 14:31  FVC-Pre Latest Units: L 2.97  FVC-%Pred-Pre Latest Units: % 98  Results for KINSLEI, LABINE (MRN [id number]) as of 11/07/2018 11:20  Ref. Range 01/29/2018 14:31  DLCO unc Latest Units: ml/min/mmHg 14.80  DLCO unc % pred Latest Units: % 54    OV 11/07/2018  Subjective:  Patient ID: Elizabeth Espinoza, female , DOB: 1940-07-02 , age 85 y.o. , MRN: [id number] , ADDRESS: [place] [address]   11/07/2018 -   Chief Complaint  Patient presents with   Follow-up    Patient reports that she has sob with any exertion.    Follow-up interstitial lung disease  HPI Elizabeth Espinoza 82 y.o. -last seen September 2020.  After that she was supposed to have follow-up high-resolution CT chest and spirometry DLCO.  For some reason the spirometry DLCO is not done.  In the interim her symptoms remain the same as shown by the symptom score below.  She does note when she does heavy exertion such as climbing stairs or long uphill walks her pulse ox drops to 84% but quickly regains.  She is not interested in portable oxygen.  She had a repeat high-resolution CT chest in September 2020 and when compared to January 2020 there is no significant change.  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  I personally visualized the film.  There is diffuse bilateral subpleural reticulation and some traction bronchiectasis.  I myself would say it may be this is indeterminate for UIP.  But there is some minimal air trapping and she is done some gardening work.  There is no upper zonal predominance that would fit in with hypersensitive pneumonitis.  She has incidental findings of bony sclerosis that is again repeated in the CT scan.  Her primary care physician Billey Gosling evaluated her.  I reviewed the note.  She is been  referred to Dr. Narda Rutherford in hematology who she is seeing November 12, 2018.  She is very confused about the fact that she has a bony sclerosis problem for which she is being referred to a blood doctor [hematologist] and she thought she is  having a blood test that was ordered by me.  I reviewed the chart and clarified this concepts   Ct Chest High Resolution  Result Date: 11/05/2018 CLINICAL DATA:  82 year old female with history of interstitial lung disease. Increased shortness of breath and cough over the past year. EXAM: CT CHEST WITHOUT CONTRAST TECHNIQUE: Multidetector CT imaging of the chest was performed following the standard protocol without intravenous contrast. High resolution imaging of the lungs, as well as inspiratory and expiratory imaging, was performed. COMPARISON:  High-resolution chest CT 02/10/2018. FINDINGS: Cardiovascular: Heart size is normal. There is no significant pericardial fluid, thickening or pericardial calcification. There is aortic atherosclerosis, as well as atherosclerosis of the great vessels of the mediastinum and the coronary arteries, including calcified atherosclerotic plaque in the left anterior descending and right coronary arteries. Mild calcifications of the mitral annulus. Mediastinum/Nodes: No pathologically enlarged mediastinal or hilar lymph nodes. Please note that accurate exclusion of hilar adenopathy is limited on noncontrast CT scans. Esophagus is unremarkable in appearance. No axillary lymphadenopathy. Lungs/Pleura: High-resolution images demonstrates some patchy areas of peripheral predominant septal thickening and subpleural reticulation, with mild cylindrical bronchiectasis and peripheral bronchiolectasis. No frank honeycombing confidently identified at this time. These findings have no definitive craniocaudal gradient. In the periphery of the mid to upper lungs there also some plaque-like areas of apparent pleuroparenchymal scarring and volume loss.  Inspiratory and expiratory imaging demonstrates minimal air trapping indicative of very mild small airways disease. Overall, these imaging findings appear stable compared to the prior study. Upper Abdomen: Aortic atherosclerosis. Musculoskeletal: Mild diffuse sclerosis throughout the visualized axial and appendicular skeleton, similar to prior examinations. There are no definite focal aggressive appearing lytic or blastic lesions noted in the visualized portions of the skeleton. IMPRESSION: 1. 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 2. Aortic atherosclerosis, in addition to 2 vessel coronary artery disease. Assessment for potential risk factor modification, dietary therapy or pharmacologic therapy may be warranted, if clinically indicated. 3. Persistent mild diffuse sclerosis throughout the visualized osseous structures without discrete aggressive appearing osseous lesions. Clinical correlation for signs and symptoms of potential infiltrative process such as myelofibrosis is suggested. Aortic Atherosclerosis (ICD10-I70.0). Electronically Signed   By: Vinnie Langton M.D.   On: 11/05/2018 14:36     OV 12/22/2018  Subjective:  Patient ID: Elizabeth Espinoza, female , DOB: 1940-04-25 , age 52 y.o. , MRN: [id number] , ADDRESS: [place] [address]   12/22/2018 -   Chief Complaint  Patient presents with   Follow-up    Pt states she has been doing good since last visit. Pt is still coughing and will get up clear mucus in the morning.   Follow-up interstitial lung disease with CT scan October 2020 being indeterminate versus alternate diagnosis.  History of gardening.  Trace  autoimmune ANA positive.  Mild progression since 2014  HPI Elizabeth Espinoza 82 y.o. -returns for follow-up.  She presents with her husband who I am meeting for the first time.  In the interim she met Dr. Roxan Hockey thoracic surgeon for surgical lung biopsy.  Her husband also met with him.  I reviewed the note.  She tells me that given the morbidity with surgical lung biopsy she wants to  undergo bronchoscopy with lavage and transbronchial biopsy first.  She understands the inherent limitations of these procedure in terms of diagnosis.  But she wants to take the lower risk profile.  Overall she feels stable.  Her symptom score is improved compared to the past.  Her walking desaturation test shows exaggerated drop in pulse ox but this was done with her wearing the mask.  She did not feel any dyspnea.  In terms of her bony sclerosis she has seen Dr. Lorenso Courier at [facility].  I reviewed the note.  He has reassured her.  Risks of pneumothorax, hemothorax, sedation/anesthesia complications such as cardiac or respiratory arrest or hypotension, stroke and bleeding all explained. Benefits of diagnosis but limitations of non-diagnosis also explained. Patient verbalized understanding and wished to proceed.   They want to have the bronchoscopy after the holidays of Christmas and new year.  This is because their house is undergoing remodeling currently.       OV 03/16/2019  Subjective:  Patient ID: Elizabeth Espinoza, female , DOB: November 13, 1940 , age 38 y.o. , MRN: [id number] , ADDRESS: [place] [address]   03/16/2019 -   Chief Complaint  Patient presents with   Follow-up    PFT performed today.  Pt states she has been doing okay since last visit and states her breathing is about the same.     Finally able to review esults of [facility]ia send out test to [place]. Date of test is 02/05/2019. 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 -returns for follow-up to discuss  bronchoscopy and lavage results.  In the interim no new respiratory issues.  They are all stable.  However she is having significant musculoskeletal issues with shoulder pain.  Serology from a year ago was negative.  They wanted to know about relatedness.  I told him it is probably not related.  We went over the bronchoscopy lavage results which showed some dominance of neutrophils consistent with UIP.  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   We had a long discussion about the benefits, risks and limitations of antifibrotic therapy.  We also discussed the choice between pirfenidone and nintedanib.       Results for FOY, MUNGIA (MRN [id number]) as of 03/16/2019 11:12  Ref. Range 01/29/2018 14:31 03/16/2019 08:42  FVC-Pre Latest Units: L 2.97 2.91  FVC-%Pred-Pre Latest Units: % 98 97   Results for MAKENLY, LARABEE (MRN [id number]) as of 03/16/2019 11:12  Ref. Range 01/29/2018 14:31 03/16/2019 08:42  DLCO unc Latest Units: ml/min/mmHg 14.80 12.32  DLCO unc % pred Latest Units: % 54 60     OV 06/11/2019 - telephine visit. Patient identified with 2 pHI, risks, benefit, limitations of tele visit explained  Subjective:  Patient ID: Elizabeth Espinoza, female , DOB: October 18, 1940 , age 88 y.o. , MRN: [id number] , ADDRESS: [place] [address] ate of test is 02/05/2019. 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 -in this telephone visit patient is now on pirfenidone.  She says she is on pirfenidone since mid April 2021.  She is on 3 pills 3 times a day.  She spacing them  4 hours apart but having some intermittent nausea every few days.  She did have an urticaria but that was before she started pirfenidone was related to Covid vaccine  that is now resolved.  She is asking about exercising at the [facility] and Covid precautions.  I advised her because she is vaccinated that the risk is low but not 0 and to take adequate precautions as tolerated.  She had liver function test and this is normal.  Her next appointment for pulmonary function test is in mid July 2021.  Recommended she make a face-to-face visit with me at that time.   OV 08/21/2019   Subjective:  Patient ID: Elizabeth Espinoza, female , DOB: [birth year], age 42 y.o. years. , MRN: [id number],  ADDRESS: [place] [address] PCP  Binnie Rail, MD Providers : Treatment Team:  Attending Provider: Brand Males, MD  Type of visit: Telephone Circumstance: COVID-19 national emergency Identification of patient Elizabeth Espinoza - 2 person identifier Risks: Risks, benefits, limitations of telephone visit explained Patient location: home This provider location: [facility]      Chief Complaint  Patient presents with   Follow-up    PFT 7/27--c/o sob with stairs and throat clearing mainly in the morning. stopped Esbriet on 08/12/2019.   Follow-up interstitial lung disease with CT scan October 2020 being indeterminate versus alternate diagnosis.  History of gardening.  Trace autoimmune ANA positive.  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 -presents for this telephone visit for IPF.  On this telephone visit she was identified with 2 person identifier.  Risks, benefits and limitations of telephone visit explained.  After last visit her GI symptoms worsen.  She did see Dr. Young Berry gastroenterologist.  She underwent endoscopy.  Dr. Collene Mares did send the results to me I do not have this.  I remember Dr. Collene Mares calling me and discussing  patient's GI side effects.  These were from pirfenidone so on August 12, 2019 based on my advise she start pirfenidone.  Since then her symptoms of GI nature have resolved.  Her respiratory symptoms continue to be stable.  These are all documented below.  At this point in time she feels that her quality of life is very important.  She does not want to go through the kind of GI side effect she went through with pirfenidone.  She would rather deal with the disease.  She is aware that nintedanib is a standard of care therapeutic option.  She is also aware after discussion the clinical trials as a care option particularly in the past to discovering new therapies that do not have this GI side effects that pirfenidone poses.  She had a lot of questions about Covid vaccine.  She is fully vaccinated.  She is worried about her grandson was refusing the vaccine.      OV 09/29/2019   Subjective:  Patient ID: Elizabeth Espinoza, female , DOB: 04/28/1940, age 62 y.o. years. , MRN: [id number],  ADDRESS: [place] [address] PCP  Binnie Rail, MD Providers : Treatment Team:  Attending Provider: Brand Males, MD   Chief Complaint  Patient presents with   Follow-up    LFT today?  pulmonary rehab or exercises.  inhaler trial, felt better after Advair.   Follow-up interstitial lung disease/IPF with CT scan October 2020 being indeterminate versus alternate diagnosis.  History of gardening.  Trace autoimmune ANA positive.  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 Date of test is 02/05/2019. 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  -Date of giving diagnosis is March 16, 2019  - MDD 05/05/19  - esbriet since 05/06/19- stopped 08/12/19 due to severe GI side effect    HPI Elizabeth Espinoza 82 y.o. -    presesnts  With her husband for IPF followup.  Overall she is doing  well.  She is not taking pirfenidone since July 2021.  After that she has gained weight.  All her GI symptoms have resolved.  She goes for daily walks.  She says when she reaches the top of the hill sometimes she desaturates to 88% but not always.  She monitors this closely.  She is asking for other exercises that can improve her endurance and lung function.  We discussed that exercises do not improve lung function.  She is willing to participate in pulmonary rehabilitation.  We discussed the alternative of taking nintedanib but because of the severe GI side effects that she had with Esbriet she is nervous about starting nintedanib and does not want to.  I introduced her to the concept of clinical trials as a care option.  At this point in time she wants to process this information.  We did discuss the fact that IPF is a progressive disease and antifibrotic's are indicated early in the course of the disease.  She prefers to have supportive care approach.  She wants to focus on strengthening her overall physical shape.   OV 06/30/2021 -standard of care visit in this research patient.  Subjective:  Patient ID: Elizabeth Espinoza, female , DOB: 03-05-1940 , age 29 y.o. , MRN: [id number] , ADDRESS: [address] [place] [address] PCP Pincus Sanes, MD Patient Care Team: Pincus Sanes, MD as PCP - General (Internal Medicine) Jens Som Madolyn Frieze, MD as Consulting Physician (Cardiology) Leslye Peer, MD as Consulting Physician (Pulmonary Disease) Kathyrn Sheriff, [facility] as Pharmacist (Pharmacist) Christia Reading, MD as Consulting Physician (Otolaryngology) [facility] as Consulting Physician (Ophthalmology)  This Provider for this visit: Treatment Team:  Attending Provider: Kalman Shan, MD    06/30/2021 -   Chief Complaint  Patient presents with   Follow-up    Pt recently had a CT of her sinuses and is here to discuss the results.  Pt states she has not been  feeling well and states she is still having problems with her sinuses. States her breathing has been doing okay.     HPI Elizabeth Espinoza 82 y.o. -on April 19, 2021 she did have some side effects from inhaled medical product.  After that we reduce the dose and slowly escalated back and then she was tolerating it fine.  On 05/22/2021 she was prescribed antibiotics for some sinus issues she did have week 8 study visit on 06/01/2021 and was doing okay.  But then on 06/20/2021 she started complaining of headache and sinus congestion and coughing and blowing yellowish mucus.  At this point in time she was taking inhaled treprostinil versus placebo investigational medical product at 4 times daily each time 12 puffs.  We told  her to reduce it to 9 puffs 4 times daily.  She tells me that perhaps she is marginally better after reducing the dose but she still having headaches.  She feels like her head is full particularly in the top part.  She also has significant cough.  She says during and after she takes the inhaled medical investigational product her cough score is 9 out of 10.  Rest of the day it is around 3 out of 10.  She coughs so much that she sometimes brings out mild yellow mucus that is like glue.  There is no diarrhea fatigue or weight loss or syncope or hemoptysis.  Definitely all the symptoms only after starting the research protocol.  She also some sore throat.  She has tried some remedial measures but these have not helped.  These include yogurt and ginger ale  For the dyspnea itself she feels stable.  She also tells me that she is able to walk a mile.  She is able to outplace her husband.  She feels her husband's declining health.  She says when she walks a mile she puts a finger probe after she rests and the pulse ox is 94%.  She is worried she might desaturate below 88%.  She does not have a forehead pulse oximeter.  She says the finger pulse ox meter does not stay on while she is walking.  I gave her a  contact of the patient support group leader to figure out a way to get a forehead probe.  Also showed her various finger proximal she can get that can stay on her finger while she exercises.  Told her the safety limit is greater than 88%.  Symptom scores below show that her symptoms are better than 2 years ago slightly.  This is from a respiratory standpoint with a cough is definitely worse after she went on the study.  And the headache is new.  She had a CT scan sinus that shows chronic findings.      OV 05/04/2022  Subjective:  Patient ID: Elizabeth Espinoza, female , DOB: 06-Feb-1940 , age 38 y.o. , MRN: [id number] , ADDRESS: [address] [place] [address] PCP Pincus Sanes, MD Patient Care Team: Pincus Sanes, MD as PCP - General (Internal Medicine) Jens Som Madolyn Frieze, MD as Consulting Physician (Cardiology) Leslye Peer, MD as Consulting Physician (Pulmonary Disease) Kathyrn Sheriff, [facility] as Pharmacist (Pharmacist) Christia Reading, MD as Consulting Physician (Otolaryngology) [facility] as Consulting Physician (Ophthalmology)  This Provider for this visit: Treatment Team:  Attending Provider: Kalman Shan, MD   Follow-up interstitial lung disease/IPF with CT scan October 2020 being indeterminate versus alternate diagnosis.  History of gardening.  Trace autoimmune ANA positive.  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 Date of test is 02/05/2019. 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  -Date of giving diagnosis is March 16, 2019  - MDD 05/05/19  - esbriet since 05/06/19- stopped 08/12/19 due to severe GI side effect   IPF patient on  -No approved antifibrotic based on personal choice and side effect profile -On inhaled treprostinil versus placebo [[place]] -phase 3 trial  -Consent February 2023  -Randomization  April 06 2021 -> QUIT early due to side effects (presumably got real drug) -> completed 1 year follow-up spring 2024.  05/04/2022 -  Chief Complaint  Patient presents with   Follow-up    F/u after researched      HPI Elizabeth Espinoza 82 y.o. -returns for routine follow-up.  In the interim she was on inhaled treprostinil versus placebo but she did not tolerate it at all.  She had all the classic side effects of the renal drug.  We made multiple attempts in holiday and giving the drug back but she did not tolerate it at all and she finally quit.  She is out of the study now.  She did do 1 year follow-up and exited the study.  Currently she is on supportive care.  She walks on Monday through Friday 3 miles a day.  This is through [facility] and she has gotten the best walker award.  She does have some chest burning when she starts the walking but it does get better as she walks further.  She is not limited by dyspnea.  She checks her pulse ox after the walk a few seconds later and the pulse ox is 93% 94% at the lowest.  I did tell her to monitor it while she walks.  In terms with therapy she is not interested in standard of care antifibrotic therapy because of side effect profile.  She tried pirfenidone in the past.  She is also been intolerant to treprostinil.  She is absolutely not interested in nintedanib.  We talked about doing the revert study sponsored by [facility].  Is an oral pill.  She read the consent form which we gave it to her some weeks ago.  She does not want to do the study because of side effect profile.  There is another study called MOONSCAPE by [place].  Is an injection study.  Current patients are tolerating the study well so far.  She might be interested in this..  Will probably aim to recruit and Q3  2024.     SYMPTOM SCALE - ILD     Inhaled Tyvaso v Placebo. 05/04/2022 Supportive care  O2 use 10/08/2018  11/07/2018  12/22/2018  06/30/21    Shortness of Breath 0 -> 5 scale  with 5 being worst (score 6 If unable to do)  ra ra   At rest 0 0 0 0   Simple tasks - showers, clothes change, eating, shaving 1 1 0 1   Household (dishes, doing bed, laundry) 2 2 2 3    Shopping 2 2 0.5 2   Walking level at own pace 2 1 0.5 1   Walking up Stairs 4 5 3  2.5   Total (40 - 48) Dyspnea Score 11 11 6  9.5   How bad is your cough? 1 3 2.5 5   How bad is your fatigue 2 2.5 1.5 am, 4.5 pm 4   nausea    1   vomit    0   diarrhea    0   anxiet    00   depression    0   pain    Headacne,, sinus complaints    Simple office walk 185 feet x  3 laps goal with forehead probe 06/30/2021    O2 used ra   Number laps completed 3   Comments about pace Fast pace   Resting Pulse Ox/HR 96% and 74/min   Final Pulse Ox/HR 96% and 117/min   Desaturated </= 88% no   Desaturated <= 3% points n   Got Tachycardic >/= 90/min yes   Symptoms at end of test  No complaints   Miscellaneous comments Sx     PFT     Latest Ref Rng & Units 03/04/2020    1:00 PM 09/15/2019    5:13 PM 03/16/2019    8:42 AM 01/29/2018    2:31 PM  PFT Results  FVC-Pre L 3.07  2.92  2.91  2.97   FVC-Predicted Pre % 104  99  97  98   FVC-Post L    2.89   FVC-Predicted Post %    95   Pre FEV1/FVC % % 78  80  79  81   Post FEV1/FCV % %    83   FEV1-Pre L 2.39  2.34  2.29  2.40   FEV1-Predicted Pre % 108  106  102  105   FEV1-Post L    2.39   DLCO uncorrected ml/min/mmHg 12.94  12.84  12.32  14.80   DLCO UNC% % 63  63  60  54   DLCO corrected ml/min/mmHg 12.94  13.26  12.91    DLCO COR %Predicted % 63  65  63    DLVA Predicted % 70  71  73  68     01Sep2023 DLCO: 8.7 / 43%     FVC: 2.801 / 102.5%  13Mar2024 DLCO: 8.0 / 39%     FVC: 2.954 / 108   has a past medical history of Allergic rhinitis, GERD (gastroesophageal reflux disease), Glaucoma, Hiatal hernia, Hyperlipidemia, Hypothyroidism, ILD (interstitial lung disease), Lichen planopilaris, MVP (mitral valve prolapse), Restless legs, and Urticaria.   reports  that she has never smoked. She has never used smokeless tobacco.  Past Surgical History:  Procedure Laterality Date   APPENDECTOMY     with TAH   CARDIAC CATHETERIZATION  1989   negative   COLONOSCOPY  2005   Dr Juanda Chance   COLONOSCOPY  01/2013   Dr Loreta Ave   esophageal dilation  07/04/2011   Dr Neita Garnet SIGMOIDOSCOPY  1999    Dr Juanda Chance   KNEE ARTHROSCOPY  2001, 2005   Dr Eulah Pont   bilat   TOTAL ABDOMINAL HYSTERECTOMY     for Endometriosis; no BSO, Dr Arletha Grippe   UPPER GI ENDOSCOPY  01/2013   Dr Loreta Ave   VIDEO BRONCHOSCOPY Bilateral 02/05/2019   Procedure: VIDEO BRONCHOSCOPY WITH FLUORO;  Surgeon: Kalman Shan, MD;  Location: [facility] ENDOSCOPY;  Service: Endoscopy;  Laterality: Bilateral;    Allergies  Allergen Reactions   Caffeine     palpitations   Chlorpheniramine-Pseudoeph     palpitations   Maxifed     palpitations   Other Other (See Comments)   Pirfenidone Nausea Only    Esbriet Causes nausea and weakness    Immunization History  Administered Date(s) Administered   Fluad Quad(high Dose 65+) 11/15/2020, 11/14/2021   Influenza Split 11/07/2010   Influenza Whole 10/30/2009, 11/27/2011   Influenza, High Dose Seasonal PF 10/27/2012, 11/05/2013, 10/01/2017, 09/11/2018, 11/23/2019   Influenza-Unspecified 11/24/2014, 10/07/2015, 10/08/2016   PFIZER(Purple Top)SARS-COV-2 Vaccination 03/14/2019, 04/06/2019, 10/27/2019   Pneumococcal Conjugate-13 10/07/2015   Pneumococcal Polysaccharide-23 07/02/2017   Respiratory Syncytial Virus Vaccine,Recomb Aduvanted(Arexvy) 12/27/2021   Tdap 02/11/2012   Zoster Recombinat (Shingrix) 10/01/2017, 12/03/2017   Zoster, Live 01/09/2012    Family History  Problem Relation Age of Onset   Lung cancer Father        Asbestos with mesothelioma   Heart disease Paternal Grandmother        ? etiology   Diabetes Paternal Grandmother    Diabetes  Brother    Uterine cancer Paternal Aunt    Stroke Paternal Grandfather        > 55   Heart  attack Brother 98   Allergies Sister    Aneurysm Mother        congenital   Stroke Brother        two strokes   Barrett's esophagus Brother      Current Outpatient Medications:    aspirin EC 81 MG tablet, Take 1 tablet (81 mg total) by mouth daily., Disp: 90 tablet, Rfl: 3   cetirizine (ZYRTEC) 10 MG tablet, Take 10 mg by mouth daily., Disp: , Rfl:    cholecalciferol (VITAMIN D3) 25 MCG (1000 UT) tablet, Take 1,000 Units by mouth daily., Disp: , Rfl:    clobetasol (TEMOVATE) 0.05 % external solution, SMARTSIG:1 Milliliter(s) Topical Twice Daily, Disp: , Rfl:    esomeprazole (NEXIUM) 40 MG capsule, Take 40 mg by mouth every morning., Disp: , Rfl:    famotidine (PEPCID) 40 MG tablet, Take 1 tablet (40 mg total) by mouth daily., Disp: 90 tablet, Rfl: 1   fluticasone (FLONASE) 50 MCG/ACT nasal spray, Place 1 spray into both nostrils daily as needed for allergies or rhinitis., Disp: , Rfl:    gabapentin (NEURONTIN) 100 MG capsule, Take 1-2 capsules (100-200 mg total) by mouth at bedtime., Disp: 120 capsule, Rfl: 2   hydroxychloroquine (PLAQUENIL) 200 MG tablet, Take 1 tablet by mouth twice a day, Disp: 60 tablet, Rfl: 9   magnesium oxide (MAG-OX) 400 MG tablet, Take 400 mg by mouth daily., Disp: , Rfl:    Multiple Vitamins-Minerals (ZINC PO), Take 1 tablet by mouth., Disp: , Rfl:    pravastatin (PRAVACHOL) 40 MG tablet, Take 1 tablet (40 mg total) by mouth daily., Disp: 90 tablet, Rfl: 2   Probiotic Product (PROBIOTIC ADVANCED) CAPS, Take by mouth., Disp: , Rfl:    rOPINIRole (REQUIP) 0.5 MG tablet, TAKE 2 TABLETS BY MOUTH  AFTER DINNER EACH NIGHT, Disp: 60 tablet, Rfl: 3   SYNTHROID 88 MCG tablet, Take 1 tablet by mouth 30 minutes before breakfast daily, Disp: 90 tablet, Rfl: 3   vitamin B-12 (CYANOCOBALAMIN) 500 MCG tablet, Take 500 mcg by mouth daily., Disp: , Rfl:       Objective:   Vitals:   05/04/22 0901  BP: 108/70  Temp: 97.6 F (36.4 C)  TempSrc: Oral  Weight: 137 lb (62.1  kg)  Height:  (1.676 m)    Estimated body mass index is 22.11 kg/m as calculated from the following:   Height as of this encounter:  (1.676 m).   Weight as of this encounter: 137 lb (62.1 kg).  @  [employer]   05/04/22 0901  Weight: 137 lb (62.1 kg)     Physical Exam General: No distress. Looks wel Neuro: Alert and Oriented x 3. GCS 15. Speech normal Psych: Pleasant Resp:  Barrel Chest - no.  Wheeze - no, Crackles - mild maybe, No overt respiratory distress CVS: Normal heart sounds. Murmurs - no Ext: Stigmata of Connective Tissue Disease - no HEENT: Normal upper airway. PEERL +. No post nasal drip        Assessment:       ICD-10-CM   1. IPF (idiopathic pulmonary fibrosis)  J84.112          Plan:     Patient Instructions     ICD-10-CM   1. IPF (idiopathic pulmonary fibrosis)  B14.782  Sinus,  sore throat, cough al seem relatd to research drug  Plan  - reduce research nebulizert to 6 times eacch time - 4 times daily  - try taking peanut butter before nebulizer - do salne nasal spray daily twice if not doing already - continue other medication - wil discuss with Reba the CRC and the study monitor to see how we can minimize symptoms - try to get forehead probe (call [employer] [facility]) for pulse ox monitoring with walks  - keep pulse ox > 88%  Folloup  -at research for now  (Level 04: Estb 30-39 min   visit type: on-site physical face to visit visit spent in total care time and counseling or/and coordination of care by this undersigned MD - Dr Kalman Shan. This includes one or more of the following on this same day 05/04/2022: pre-charting, chart review, note writing, documentation discussion of test results, diagnostic or treatment recommendations, prognosis, risks and benefits of management options, instructions, education, compliance or risk-factor reduction. It excludes time spent by the CMA or office staff in the care of the  patient . Actual time is 30 min)   SIGNATURE    Dr. Kalman Shan, M.D., F.C.C.P,  Pulmonary and Critical Care Medicine Staff Physician, [facility] Center Director - Interstitial Lung Disease  Program  Pulmonary Fibrosis [facility] at [facility] [place], [place], [address]  Pager: [phone], If no answer or between  15:00h - 7:00h: call [phone] Telephone: [phone]  9:37 AM 05/04/2022

## 2022-05-15 ENCOUNTER — Other Ambulatory Visit: Payer: Self-pay | Admitting: Internal Medicine

## 2022-05-21 ENCOUNTER — Telehealth: Payer: Self-pay | Admitting: Internal Medicine

## 2022-05-21 NOTE — Patient Instructions (Signed)
ICD-10-CM   1. Research subject  Z00.6     2. IPF (idiopathic pulmonary fibrosis) (HCC)  Z61.096       Per research

## 2022-05-21 NOTE — Telephone Encounter (Signed)
Last seen in rseerch in march  Plan  - she needs spiro.dlco sometime in  June-Aug and see me for 30 mi        Latest Ref Rng & Units 03/04/2020    1:00 PM 09/15/2019    5:13 PM 03/16/2019    8:42 AM 01/29/2018    2:31 PM  PFT Results  FVC-Pre L 3.07  2.92  2.91  2.97   FVC-Predicted Pre % 104  99  97  98   FVC-Post L    2.89   FVC-Predicted Post %    95   Pre FEV1/FVC % % 78  80  79  81   Post FEV1/FCV % %    83   FEV1-Pre L 2.39  2.34  2.29  2.40   FEV1-Predicted Pre % 108  106  102  105   FEV1-Post L    2.39   DLCO uncorrected ml/min/mmHg 12.94  12.84  12.32  14.80   DLCO UNC% % 63  63  60  54   DLCO corrected ml/min/mmHg 12.94  13.26  12.91    DLCO COR %Predicted % 63  65  63    DLVA Predicted % 70  71  73  68

## 2022-05-21 NOTE — Telephone Encounter (Signed)
PT confused when she should come back in. She says 6 mo. For 30 min appt.  Last AVS notes have no time frame and say the following:  Return for 15 min visit, ILD, with Dr Marchelle Gearing, after Cleda Daub and DLCO.   Please call @ [phone]

## 2022-05-21 NOTE — Progress Notes (Signed)
Title:A Randomized, Double-blind, Placebo-controlled, Phase 3 Study of the Efficacy and Safety of Inhaled Treprostinil in Subjects with Idiopathic Pulmonary Fibrosis   Dose and Duration of Treatment:Treprostinil for Inhalation 0.6 mg/mL, or placebo (randomly assigned 1:1) over a 52 week period.  Protocol # RIN-PF-301; IND # [id number]; Clinical Trials.gov Identifier: [id number]  Sponsor: [employer]Research [place], [place] [address]  [email or web address]   S: Late entry for visit 04/04/22 - End of study visit for abovfe study  O No complaints Docuentaion in paper source  A Research IPF  Plan  - per protocol - she is ending study.Opts for supportive care    SIGNATURE    Dr. Kalman Shan, M.D., F.C.C.P, ACRP-CPI Pulmonary and Critical Care Medicine Research Investigator, [facility] @ [facility] Staff Physician, [facility] Center Director - Interstitial Lung Disease  Program  Pulmonary Fibrosis [facility] [place] [facility] and [facility] @ [facility] [place], [place], [address]   Pager: [phone], If no answer  OR between  19:00-7:00h: page [phone] Telephone (research): [phone]  10:35 AM 05/21/2022   10:35 AM 05/21/2022

## 2022-05-23 ENCOUNTER — Other Ambulatory Visit: Payer: Self-pay

## 2022-05-23 DIAGNOSIS — J849 Interstitial pulmonary disease, unspecified: Secondary | ICD-10-CM

## 2022-05-23 NOTE — Telephone Encounter (Signed)
Called patient.  Tried to schedule patient but was unable to find a suitable date for both the 30 minute PFT and 30 minute OV with MR.  Told patient I would have Tresa Endo at front desk call patient and see what she could work out.  Went to front desk and spoke with [employer].  She will call patient and get her scheduled.  Will put in orders only for the PFT.   Kelly sent message back that all visits were scheduled.  Nothing further needed at this time.

## 2022-05-24 NOTE — Telephone Encounter (Signed)
MR- will you please verify when she is due back   Looks like you said last visit for her to see research next?   She says you told her 6 months

## 2022-05-24 NOTE — Telephone Encounter (Signed)
Apologize for any confusion.  If she is feeling stable she can come back anytime in the next 3-6 months for a 15-minute visit with spirometry and DLCO done right before the visit.  I prefer for her to come in the 3-90-month range

## 2022-05-25 NOTE — Telephone Encounter (Signed)
Called and spoke with patient. She verbalized understanding and will keep the appts already scheduled for 7/25. She is aware to call us if anything changes or if she needs to be seen sooner.   Nothing further needed at time of call.

## 2022-06-06 ENCOUNTER — Other Ambulatory Visit: Payer: Self-pay | Admitting: *Deleted

## 2022-06-06 DIAGNOSIS — I739 Peripheral vascular disease, unspecified: Secondary | ICD-10-CM

## 2022-06-12 ENCOUNTER — Other Ambulatory Visit: Payer: Self-pay

## 2022-06-12 ENCOUNTER — Encounter: Payer: Self-pay | Admitting: Internal Medicine

## 2022-06-12 MED ORDER — ROPINIROLE HCL 0.5 MG PO TABS: ORAL_TABLET | ORAL | 1 refills | Status: DC

## 2022-06-14 NOTE — Progress Notes (Signed)
Office Note     CC: Restless legs, nocturnal leg pain Requesting Provider:  Pincus Sanes, MD  HPI: Elizabeth Espinoza is a 82 y.o. (1940-07-30) female presenting at the request of .Elizabeth Sanes, MD for evaluation of nocturnal leg pain, with restless legs.  On exam, Elizabeth Espinoza was doing well.  Originally from [place], she moved to [place] years ago.  She worked at [employer] prior to retiring roughly 20 years ago.  She remarried 22 years ago and has multiple children and grandchildren in [place] in [place].  Lives a very active lifestyle, walking 3 miles daily at [facility].  She denies symptoms of claudication, ischemic rest pain, tissue loss.  She has appreciated bilateral lower extremity pain, as well as restless legs overnight, for which she takes ropinirole.    The pt is  on a statin for cholesterol management.  The pt is  on a daily aspirin.   Other AC:  - The pt is not on medication for hypertension.   The pt is not diabetic.  Tobacco hx:  none  Past Medical History:  Diagnosis Date   Allergic rhinitis    GERD (gastroesophageal reflux disease)    Glaucoma    SUSPECT   Hiatal hernia    Hyperlipidemia    LDL goal = < 100   Hypothyroidism    ILD (interstitial lung disease) (HCC)    Lichen planopilaris    MVP (mitral valve prolapse)    documented on 2 D ECHO   Restless legs    Urticaria     Past Surgical History:  Procedure Laterality Date   APPENDECTOMY     with TAH   CARDIAC CATHETERIZATION  1989   negative   COLONOSCOPY  2005   Dr Juanda Chance   COLONOSCOPY  01/2013   Dr Loreta Ave   esophageal dilation  07/04/2011   Dr Neita Garnet SIGMOIDOSCOPY  1999    Dr Juanda Chance   KNEE ARTHROSCOPY  2001, 2005   Dr Eulah Pont   bilat   TOTAL ABDOMINAL HYSTERECTOMY     for Endometriosis; no BSO, Dr Arletha Grippe   UPPER GI ENDOSCOPY  01/2013   Dr Loreta Ave   VIDEO BRONCHOSCOPY Bilateral 02/05/2019   Procedure: VIDEO BRONCHOSCOPY WITH FLUORO;  Surgeon: Kalman Shan, MD;   Location: [facility] ENDOSCOPY;  Service: Endoscopy;  Laterality: Bilateral;    Social History   Socioeconomic History   Marital status: Married    Spouse name: Elizabeth Espinoza   Number of children: 2   Years of education: Not on file   Highest education level: Not on file  Occupational History   Occupation: retired from [employer] retired  Tobacco Use   Smoking status: Never   Smokeless tobacco: Never  Vaping Use   Vaping Use: Never used  Substance and Sexual Activity   Alcohol use: Yes    Comment:  rarely   Drug use: No   Sexual activity: Yes  Other Topics Concern   Not on file  Social History Narrative   Not on file   Social Determinants of Health   Financial Resource Strain: Low Risk  (03/27/2022)   Overall Financial Resource Strain (CARDIA)    Difficulty of Paying Living Expenses: Not hard at all  Food Insecurity: No Food Insecurity (03/27/2022)   Hunger Vital Sign    Worried About Running Out of Food in the Last Year: Never true    Ran Out of Food in the Last Year: Never true  Transportation Needs:  No Transportation Needs (03/27/2022)   PRAPARE - Administrator, Civil Service (Medical): No    Lack of Transportation (Non-Medical): No  Physical Activity: Sufficiently Active (03/27/2022)   Exercise Vital Sign    Days of Exercise per Week: 6 days    Minutes of Exercise per Session: 50 min  Stress: No Stress Concern Present (03/27/2022)   [employer] of Occupational Health - Occupational Stress Questionnaire    Feeling of Stress : Not at all  Social Connections: Moderately Isolated (03/27/2022)   Social Connection and Isolation Panel [NHANES]    Frequency of Communication with Friends and Family: More than three times a week    Frequency of Social Gatherings with Friends and Family: More than three times a week    Attends Religious Services: Never    Database administrator or Organizations: No    Attends Banker Meetings: Never    Marital Status: Married   Catering manager Violence: Not At Risk (03/27/2022)   Humiliation, Afraid, Rape, and Kick questionnaire    Fear of Current or Ex-Partner: No    Emotionally Abused: No    Physically Abused: No    Sexually Abused: No   Family History  Problem Relation Age of Onset   Lung cancer Father        Asbestos with mesothelioma   Heart disease Paternal Grandmother        ? etiology   Diabetes Paternal Grandmother    Diabetes Brother    Uterine cancer Paternal Aunt    Stroke Paternal Grandfather        > 55   Heart attack Brother 22   Allergies Sister    Aneurysm Mother        congenital   Stroke Brother        two strokes   Barrett's esophagus Brother     Current Outpatient Medications  Medication Sig Dispense Refill   aspirin EC 81 MG tablet Take 1 tablet (81 mg total) by mouth daily. 90 tablet 3   cetirizine (ZYRTEC) 10 MG tablet Take 10 mg by mouth daily.     cholecalciferol (VITAMIN D3) 25 MCG (1000 UT) tablet Take 1,000 Units by mouth daily.     clobetasol (TEMOVATE) 0.05 % external solution SMARTSIG:1 Milliliter(s) Topical Twice Daily     esomeprazole (NEXIUM) 40 MG capsule Take 40 mg by mouth every morning.     famotidine (PEPCID) 40 MG tablet Take 1 tablet (40 mg total) by mouth daily. 90 tablet 1   fluticasone (FLONASE) 50 MCG/ACT nasal spray Place 1 spray into both nostrils daily as needed for allergies or rhinitis.     gabapentin (NEURONTIN) 100 MG capsule Take 1-2 capsules (100-200 mg total) by mouth at bedtime. 120 capsule 2   hydroxychloroquine (PLAQUENIL) 200 MG tablet Take 1 tablet by mouth twice a day 60 tablet 9   magnesium oxide (MAG-OX) 400 MG tablet Take 400 mg by mouth daily.     Multiple Vitamins-Minerals (ZINC PO) Take 1 tablet by mouth.     pravastatin (PRAVACHOL) 40 MG tablet Take 1 tablet (40 mg total) by mouth daily. 90 tablet 2   Probiotic Product (PROBIOTIC ADVANCED) CAPS Take by mouth.     rOPINIRole (REQUIP) 0.5 MG tablet TAKE 2 TABLETS BY MOUTH EVERY  NIGHT AFTER DINNER 180 tablet 1   SYNTHROID 88 MCG tablet Take 1 tablet by mouth 30 minutes before breakfast daily 90 tablet 3   vitamin B-12 (CYANOCOBALAMIN) 500  MCG tablet Take 500 mcg by mouth daily.     No current facility-administered medications for this visit.    Allergies  Allergen Reactions   Caffeine     palpitations   Chlorpheniramine-Pseudoeph     palpitations   Maxifed     palpitations   Other Other (See Comments)   Pirfenidone Nausea Only    Esbriet Causes nausea and weakness     REVIEW OF SYSTEMS:  [X]  denotes positive finding, [ ]  denotes negative finding Cardiac  Comments:  Chest pain or chest pressure:    Shortness of breath upon exertion:    Short of breath when lying flat:    Irregular heart rhythm:        Vascular    Pain in calf, thigh, or hip brought on by ambulation:    Pain in feet at night that wakes you up from your sleep:     Blood clot in your veins:    Leg swelling:         Pulmonary    Oxygen at home:    Productive cough:     Wheezing:         Neurologic    Sudden weakness in arms or legs:     Sudden numbness in arms or legs:     Sudden onset of difficulty speaking or slurred speech:    Temporary loss of vision in one eye:     Problems with dizziness:         Gastrointestinal    Blood in stool:     Vomited blood:         Genitourinary    Burning when urinating:     Blood in urine:        Psychiatric    Major depression:         Hematologic    Bleeding problems:    Problems with blood clotting too easily:        Skin    Rashes or ulcers:        Constitutional    Fever or chills:      PHYSICAL EXAMINATION:  There were no vitals filed for this visit.  General:  WDWN in NAD; vital signs documented above Gait: Not observed HENT: WNL, normocephalic Pulmonary: normal non-labored breathing , without wheezing Cardiac: regular HR Abdomen: soft, NT, no masses Skin: without rashes Vascular Exam/Pulses:  Right Left   Radial 2+ (normal) 2+ (normal)  Ulnar    Femoral    Popliteal    DP 2+ (normal) 2+ (normal)  PT     Extremities: without ischemic changes, without Gangrene , without cellulitis; without open wounds;  Musculoskeletal: no muscle wasting or atrophy  Neurologic: A&O X 3;  No focal weakness or paresthesias are detected Psychiatric:  The pt has Normal affect.   Non-Invasive Vascular Imaging:   ABI Findings:  +---------+------------------+-----+---------+--------+  Right   Rt Pressure (mmHg)IndexWaveform Comment   +---------+------------------+-----+---------+--------+  Brachial 140                                       +---------+------------------+-----+---------+--------+  PTA     157               1.11 triphasic          +---------+------------------+-----+---------+--------+  DP      138  0.98 biphasic           +---------+------------------+-----+---------+--------+  Great Toe119               0.84                    +---------+------------------+-----+---------+--------+   +---------+------------------+-----+---------+-------+  Left    Lt Pressure (mmHg)IndexWaveform Comment  +---------+------------------+-----+---------+-------+  Brachial 141                                      +---------+------------------+-----+---------+-------+  PTA     148               1.05 triphasic         +---------+------------------+-----+---------+-------+  DP      146               1.04 biphasic          +---------+------------------+-----+---------+-------+  Great Toe140               0.99                   +---------+------------------+-----+---------+-------+   +-------+-----------+-----------+------------+------------+  ABI/TBIToday's ABIToday's TBIPrevious ABIPrevious TBI  +-------+-----------+-----------+------------+------------+  Right 1.11       0.84                                  +-------+-----------+-----------+------------+------------+  Left  1.05       0.99                                 +-------+-----------+-----------+------------+------------+     ASSESSMENT/PLAN: ZOII KALIN is a 82 y.o. female presenting with nocturnal leg pain, restless legs.  ABI was reviewed demonstrating normal perfusion bilaterally. She has a palpable pulse in the feet. I do not have an etiology as to her nocturnal leg pain or restless leg syndrome.  Cramping in the legs can occur due to electrolyte normalities.   We discussed the importance of hydration  Roizy and I had a discussion regarding the above.  She is happy that she does not have peripheral arterial disease requiring further follow-up or intervention.  She can follow-up with me as needed.   Victorino Sparrow, MD [facility] [phone]

## 2022-06-15 ENCOUNTER — Encounter: Payer: Self-pay | Admitting: Vascular Surgery

## 2022-06-15 ENCOUNTER — Ambulatory Visit: Payer: Medicare Other | Admitting: Vascular Surgery

## 2022-06-15 ENCOUNTER — Ambulatory Visit (HOSPITAL_COMMUNITY)
Admission: RE | Admit: 2022-06-15 | Discharge: 2022-06-15 | Disposition: A | Discharge status: Home / Self Care | Payer: Medicare Other | Source: Ambulatory Visit | Attending: Vascular Surgery | Admitting: Vascular Surgery

## 2022-06-15 VITALS — BP 125/69 | HR 60 | Temp 98.0°F | Resp 20 | Ht 66.0 in | Wt 138.8 lb

## 2022-06-15 DIAGNOSIS — G4762 Sleep related leg cramps: Secondary | ICD-10-CM | POA: Diagnosis not present

## 2022-06-15 DIAGNOSIS — R258 Other abnormal involuntary movements: Secondary | ICD-10-CM

## 2022-06-15 DIAGNOSIS — I739 Peripheral vascular disease, unspecified: Secondary | ICD-10-CM

## 2022-06-15 LAB — VAS US ABI WITH/WO TBI
Left ABI: 1.05
Right ABI: 1.11

## 2022-07-05 ENCOUNTER — Ambulatory Visit: Payer: Medicare Other | Admitting: Internal Medicine

## 2022-07-15 ENCOUNTER — Encounter: Payer: Self-pay | Admitting: Internal Medicine

## 2022-07-15 NOTE — Progress Notes (Unsigned)
Subjective:    Patient ID: Elizabeth Espinoza, female    DOB: 20-Apr-1940, 82 y.o.   MRN: [id number]      HPI Elizabeth Espinoza is here for a Physical exam and her chronic medical problems.   Doing good overall.    Saw vascular - PAD is not causing the numbness in her toes.   It is constant.  No pain.         Medications and allergies reviewed with patient and updated if appropriate.  Current Outpatient Medications on File Prior to Visit  Medication Sig Dispense Refill   aspirin EC 81 MG tablet Take 1 tablet (81 mg total) by mouth daily. 90 tablet 3   cetirizine (ZYRTEC) 10 MG tablet Take 10 mg by mouth daily.     cholecalciferol (VITAMIN D3) 25 MCG (1000 UT) tablet Take 1,000 Units by mouth daily.     clobetasol (TEMOVATE) 0.05 % external solution SMARTSIG:1 Milliliter(s) Topical Twice Daily     esomeprazole (NEXIUM) 40 MG capsule Take 40 mg by mouth every morning.     famotidine (PEPCID) 40 MG tablet Take 1 tablet (40 mg total) by mouth daily. 90 tablet 1   Ferrous Sulfate Dried (SLOW RELEASE IRON) 45 MG TBCR Take by mouth.     fluticasone (FLONASE) 50 MCG/ACT nasal spray Place 1 spray into both nostrils daily as needed for allergies or rhinitis.     gabapentin (NEURONTIN) 100 MG capsule Take 1-2 capsules (100-200 mg total) by mouth at bedtime. 120 capsule 2   hydroxychloroquine (PLAQUENIL) 200 MG tablet Take 1 tablet by mouth twice a day 60 tablet 9   magnesium oxide (MAG-OX) 400 MG tablet Take 400 mg by mouth daily.     Multiple Vitamins-Minerals (ZINC PO) Take 1 tablet by mouth.     pravastatin (PRAVACHOL) 40 MG tablet Take 1 tablet (40 mg total) by mouth daily. 90 tablet 2   Probiotic Product (PROBIOTIC ADVANCED) CAPS Take by mouth.     rOPINIRole (REQUIP) 0.5 MG tablet TAKE 2 TABLETS BY MOUTH EVERY NIGHT AFTER DINNER 180 tablet 1   SYNTHROID 88 MCG tablet Take 1 tablet by mouth 30 minutes before breakfast daily 90 tablet 3   vitamin B-12 (CYANOCOBALAMIN) 500 MCG tablet Take 500 mcg  by mouth daily.     [DISCONTINUED] loratadine (CLARITIN) 10 MG tablet Take 10 mg by mouth daily as needed.       [DISCONTINUED] omeprazole (PRILOSEC) 40 MG capsule Take 1 capsule (40 mg total) by mouth daily. 90 capsule 0   No current facility-administered medications on file prior to visit.    Review of Systems  Constitutional:  Negative for fever.  Eyes:  Negative for visual disturbance.  Respiratory:  Positive for cough (clearing throat/mucus) and shortness of breath (with steps, exertion). Negative for wheezing.   Cardiovascular:  Negative for chest pain, palpitations and leg swelling.  Gastrointestinal:  Negative for abdominal pain, blood in stool, constipation and diarrhea.       Rare gerd  Genitourinary:  Negative for dysuria.  Musculoskeletal:  Positive for arthralgias (minimal) and back pain (chronic, intermittent).  Skin:  Negative for rash.  Neurological:  Negative for light-headedness and headaches.  Psychiatric/Behavioral:  Negative for dysphoric mood. The patient is not nervous/anxious.        Objective:   Vitals:   07/16/22 0821  BP: 118/68  Pulse: 65  Temp: 97.7 F (36.5 C)  SpO2: 99%   Filed Weights   07/16/22 0821  Weight:  136 lb (61.7 kg)   Body mass index is 21.95 kg/m.  BP Readings from Last 3 Encounters:  07/16/22 118/68  06/15/22 125/69  05/04/22 108/70    Wt Readings from Last 3 Encounters:  07/16/22 136 lb (61.7 kg)  06/15/22 138 lb 12.8 oz (63 kg)  05/04/22 137 lb (62.1 kg)       Physical Exam Constitutional: She appears well-developed and well-nourished. No distress.  HENT:  Head: Normocephalic and atraumatic.  Right Ear: External ear normal. Normal ear canal and TM Left Ear: External ear normal.  Normal ear canal and TM Mouth/Throat: Oropharynx is clear and moist.  Eyes: Conjunctivae normal.  Neck: Neck supple. No tracheal deviation present. No thyromegaly present.  No carotid bruit  Cardiovascular: Normal rate, regular rhythm  and normal heart sounds.   No murmur heard.  No edema. Pulmonary/Chest: Effort normal.  Dry crackles at bases.  No wheezing.   No respiratory distress.  Breast: deferred   Abdominal: Soft. She exhibits no distension. There is no tenderness.  Lymphadenopathy: She has no cervical adenopathy.  Skin: Skin is warm and dry. She is not diaphoretic.  Psychiatric: She has a normal mood and affect. Her behavior is normal.     Lab Results  Component Value Date   WBC 9.2 05/01/2022   HGB 13.6 05/01/2022   HCT 41.0 05/01/2022   PLT 231.0 05/01/2022   GLUCOSE 86 05/01/2022   CHOL 122 07/10/2021   TRIG 85.0 07/10/2021   HDL 53.00 07/10/2021   LDLCALC 52 07/10/2021   ALT 12 05/01/2022   AST 20 05/01/2022   NA 140 05/01/2022   K 4.3 05/01/2022   CL 105 05/01/2022   CREATININE 0.77 05/01/2022   BUN 17 05/01/2022   CO2 25 05/01/2022   TSH 0.55 01/29/2022   INR 1.0 01/29/2019   HGBA1C 5.8 05/01/2022         Assessment & Plan:   Physical exam: Screening blood work  ordered Exercise  walking  3 miles a day Weight   normal Substance abuse  none   Reviewed recommended immunizations.   Health Maintenance  Topic Date Due   COVID-19 Vaccine (4 - 2023-24 season) 09/22/2021   DTaP/Tdap/Td (2 - Td or Tdap) 02/10/2022   INFLUENZA VACCINE  08/23/2022   Medicare Annual Wellness (AWV)  03/27/2023   DEXA SCAN  01/31/2027   Pneumonia Vaccine 24+ Years old  Completed   Zoster Vaccines- Shingrix  Completed   HPV VACCINES  Aged Out   Hepatitis C Screening  Discontinued          See Problem List for Assessment and Plan of chronic medical problems.

## 2022-07-15 NOTE — Patient Instructions (Addendum)
Blood work was ordered.   The lab is on the first floor.    Medications changes include :       A referral was ordered and someone will call you to schedule an appointment.     Return in about 1 year (around 07/16/2023) for Physical Exam.   Health Maintenance, Female Adopting a healthy lifestyle and getting preventive care are important in promoting health and wellness. Ask your health care provider about: The right schedule for you to have regular tests and exams. Things you can do on your own to prevent diseases and keep yourself healthy. What should I know about diet, weight, and exercise? Eat a healthy diet  Eat a diet that includes plenty of vegetables, fruits, low-fat dairy products, and lean protein. Do not eat a lot of foods that are high in solid fats, added sugars, or sodium. Maintain a healthy weight Body mass index (BMI) is used to identify weight problems. It estimates body fat based on height and weight. Your health care provider can help determine your BMI and help you achieve or maintain a healthy weight. Get regular exercise Get regular exercise. This is one of the most important things you can do for your health. Most adults should: Exercise for at least 150 minutes each week. The exercise should increase your heart rate and make you sweat (moderate-intensity exercise). Do strengthening exercises at least twice a week. This is in addition to the moderate-intensity exercise. Spend less time sitting. Even light physical activity can be beneficial. Watch cholesterol and blood lipids Have your blood tested for lipids and cholesterol at 81 years of age, then have this test every 5 years. Have your cholesterol levels checked more often if: Your lipid or cholesterol levels are high. You are older than 81 years of age. You are at high risk for heart disease. What should I know about cancer screening? Depending on your health history and family history, you may  need to have cancer screening at various ages. This may include screening for: Breast cancer. Cervical cancer. Colorectal cancer. Skin cancer. Lung cancer. What should I know about heart disease, diabetes, and high blood pressure? Blood pressure and heart disease High blood pressure causes heart disease and increases the risk of stroke. This is more likely to develop in people who have high blood pressure readings or are overweight. Have your blood pressure checked: Every 3-5 years if you are 33-52 years of age. Every year if you are 12 years old or older. Diabetes Have regular diabetes screenings. This checks your fasting blood sugar level. Have the screening done: Once every three years after age 69 if you are at a normal weight and have a low risk for diabetes. More often and at a younger age if you are overweight or have a high risk for diabetes. What should I know about preventing infection? Hepatitis B If you have a higher risk for hepatitis B, you should be screened for this virus. Talk with your health care provider to find out if you are at risk for hepatitis B infection. Hepatitis C Testing is recommended for: Everyone born from [birth year] through 1964. Anyone with known risk factors for hepatitis C. Sexually transmitted infections (STIs) Get screened for STIs, including gonorrhea and chlamydia, if: You are sexually active and are younger than 81 years of age. You are older than 81 years of age and your health care provider tells you that you are at risk for this type  of infection. Your sexual activity has changed since you were last screened, and you are at increased risk for chlamydia or gonorrhea. Ask your health care provider if you are at risk. Ask your health care provider about whether you are at high risk for HIV. Your health care provider may recommend a prescription medicine to help prevent HIV infection. If you choose to take medicine to prevent HIV, you should first get  tested for HIV. You should then be tested every 3 months for as long as you are taking the medicine. Pregnancy If you are about to stop having your period (premenopausal) and you may become pregnant, seek counseling before you get pregnant. Take 400 to 800 micrograms (mcg) of folic acid every day if you become pregnant. Ask for birth control (contraception) if you want to prevent pregnancy. Osteoporosis and menopause Osteoporosis is a disease in which the bones lose minerals and strength with aging. This can result in bone fractures. If you are 42 years old or older, or if you are at risk for osteoporosis and fractures, ask your health care provider if you should: Be screened for bone loss. Take a calcium or vitamin D supplement to lower your risk of fractures. Be given hormone replacement therapy (HRT) to treat symptoms of menopause. Follow these instructions at home: Alcohol use Do not drink alcohol if: Your health care provider tells you not to drink. You are pregnant, may be pregnant, or are planning to become pregnant. If you drink alcohol: Limit how much you have to: 0-1 drink a day. Know how much alcohol is in your drink. In the [place] one drink equals one 12 oz bottle of beer (355 mL), one 5 oz glass of wine (148 mL), or one 1 oz glass of hard liquor (44 mL). Lifestyle Do not use any products that contain nicotine or tobacco. These products include cigarettes, chewing tobacco, and vaping devices, such as e-cigarettes. If you need help quitting, ask your health care provider. Do not use street drugs. Do not share needles. Ask your health care provider for help if you need support or information about quitting drugs. General instructions Schedule regular health, dental, and eye exams. Stay current with your vaccines. Tell your health care provider if: You often feel depressed. You have ever been abused or do not feel safe at home. Summary Adopting a healthy lifestyle and getting  preventive care are important in promoting health and wellness. Follow your health care provider's instructions about healthy diet, exercising, and getting tested or screened for diseases. Follow your health care provider's instructions on monitoring your cholesterol and blood pressure. This information is not intended to replace advice given to you by your health care provider. Make sure you discuss any questions you have with your health care provider. Document Revised: 05/30/2020 Document Reviewed: 05/30/2020 Elsevier Patient Education  2024 [employer].

## 2022-07-16 ENCOUNTER — Ambulatory Visit (INDEPENDENT_AMBULATORY_CARE_PROVIDER_SITE_OTHER): Payer: Medicare Other | Admitting: Internal Medicine

## 2022-07-16 VITALS — BP 118/68 | HR 65 | Temp 97.7°F | Ht 66.0 in | Wt 136.0 lb

## 2022-07-16 DIAGNOSIS — D692 Other nonthrombocytopenic purpura: Secondary | ICD-10-CM

## 2022-07-16 DIAGNOSIS — J849 Interstitial pulmonary disease, unspecified: Secondary | ICD-10-CM | POA: Diagnosis not present

## 2022-07-16 DIAGNOSIS — E782 Mixed hyperlipidemia: Secondary | ICD-10-CM

## 2022-07-16 DIAGNOSIS — E039 Hypothyroidism, unspecified: Secondary | ICD-10-CM | POA: Diagnosis not present

## 2022-07-16 DIAGNOSIS — Z0000 Encounter for general adult medical examination without abnormal findings: Secondary | ICD-10-CM

## 2022-07-16 DIAGNOSIS — Z8639 Personal history of other endocrine, nutritional and metabolic disease: Secondary | ICD-10-CM

## 2022-07-16 DIAGNOSIS — K219 Gastro-esophageal reflux disease without esophagitis: Secondary | ICD-10-CM

## 2022-07-16 DIAGNOSIS — R252 Cramp and spasm: Secondary | ICD-10-CM

## 2022-07-16 DIAGNOSIS — G2581 Restless legs syndrome: Secondary | ICD-10-CM

## 2022-07-16 DIAGNOSIS — R7303 Prediabetes: Secondary | ICD-10-CM

## 2022-07-16 DIAGNOSIS — I700 Atherosclerosis of aorta: Secondary | ICD-10-CM

## 2022-07-16 NOTE — Assessment & Plan Note (Signed)
Chronic Following with pulmonary 

## 2022-07-16 NOTE — Assessment & Plan Note (Signed)
Chronic Intermittent Nocturnal or if sitting long periods Drinks a good amount of water

## 2022-07-16 NOTE — Assessment & Plan Note (Signed)
H/o iron def Has RLS Taking OTC slow release iron Check ferritin

## 2022-07-16 NOTE — Assessment & Plan Note (Signed)
Chronic  management per Dr. Elvera Lennox

## 2022-07-16 NOTE — Assessment & Plan Note (Signed)
Chronic Continue pravastatin 40 mg daily Continue aspirin 81 mg daily Continue healthy diet and regular exercise Lipids well controlled Lab Results  Component Value Date   LDLCALC 52 07/10/2021    

## 2022-07-16 NOTE — Assessment & Plan Note (Signed)
Chronic GERD controlled Continue Nexium 40 mg daily, Pepcid 40 mg daily as needed 

## 2022-07-16 NOTE — Assessment & Plan Note (Addendum)
Chronic Recent A1c 5.8% Low sugar / carb diet Stressed regular exercise

## 2022-07-16 NOTE — Assessment & Plan Note (Signed)
Chronic Reassured  B/l forearms

## 2022-07-16 NOTE — Assessment & Plan Note (Signed)
Chronic Not ideally controlled Continue slow release iron Continue requip1 mg at dinner Gabapentin 100-200 mg HS Check ferritin

## 2022-07-16 NOTE — Assessment & Plan Note (Signed)
Chronic Lipids well controlled Check lipid panel Continue pravastatin 40 mg daily Continue regular exercise and healthy diet

## 2022-08-12 ENCOUNTER — Other Ambulatory Visit: Payer: Self-pay | Admitting: Internal Medicine

## 2022-08-13 ENCOUNTER — Other Ambulatory Visit: Payer: Self-pay

## 2022-08-13 DIAGNOSIS — E282 Polycystic ovarian syndrome: Secondary | ICD-10-CM

## 2022-08-13 NOTE — Progress Notes (Signed)
HPI:  FU dyspnea and coronary calcification. ETT September 2016 normal. CPX January 2020 showed normal functional capacity for age with duration 8:45; no ST changes; patient complained of chest pain, dyspnea and dizziness; there were other abnormal parameters suggesting cardiovascular limitation and also pulmonary impairment. Echocardiogram May 2020 showed normal LV function, grade 2 diastolic dysfunction and mild mitral valve prolapse.  Cardiac CTA June 2020 showed a calcium score of 0 and minimal nonobstructive coronary disease. 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  ABIs May 2024 normal.  Since last seen the patient has dyspnea with more extreme activities but not with routine activities. It is relieved with rest. It is not associated with chest pain. There is no orthopnea, PND or pedal edema. There is no syncope or palpitations. There is occasional chest pain with exertion that is unchanged.   Current Outpatient Medications  Medication Sig Dispense Refill   aspirin EC 81 MG tablet Take 1 tablet (81 mg total) by mouth daily. 90 tablet 3   cetirizine (ZYRTEC) 10 MG tablet Take 10 mg by mouth daily.     cholecalciferol (VITAMIN D3) 25 MCG (1000 UT) tablet Take 1,000 Units by mouth daily.     esomeprazole (NEXIUM) 40 MG capsule Take 40 mg by mouth every morning.     famotidine (PEPCID) 40 MG tablet Take 1 tablet (40 mg total) by mouth daily. 90 tablet 1   Ferrous Sulfate Dried (SLOW RELEASE IRON) 45 MG TBCR Take by mouth.     fluticasone (FLONASE) 50 MCG/ACT nasal spray Place 1 spray into both nostrils daily as needed for allergies or rhinitis.     hydroxychloroquine (PLAQUENIL) 200 MG tablet Take 1 tablet by mouth twice a day 60 tablet 9   magnesium oxide (MAG-OX) 400 MG tablet Take 400 mg by mouth daily.     pravastatin (PRAVACHOL) 40 MG tablet Take 1 tablet (40 mg total) by mouth daily. 90 tablet 2   Probiotic Product (PROBIOTIC ADVANCED) CAPS Take by mouth.      rOPINIRole (REQUIP) 0.5 MG tablet TAKE 2 TABLETS BY MOUTH EVERY NIGHT AFTER DINNER 180 tablet 1   SYNTHROID 88 MCG tablet TAKE 1 TABLET DAILY 30 MINUTES BEFORE BREAKFAST 90 tablet 3   vitamin B-12 (CYANOCOBALAMIN) 500 MCG tablet Take 500 mcg by mouth daily.     clobetasol (TEMOVATE) 0.05 % external solution SMARTSIG:1 Milliliter(s) Topical Twice Daily (Patient not taking: Reported on 08/27/2022)     Multiple Vitamins-Minerals (ZINC PO) Take 1 tablet by mouth. (Patient not taking: Reported on 08/27/2022)     No current facility-administered medications for this visit.     Past Medical History:  Diagnosis Date   Allergic rhinitis    GERD (gastroesophageal reflux disease)    Glaucoma    SUSPECT   Hiatal hernia    Hyperlipidemia    LDL goal = < 100   Hypothyroidism    ILD (interstitial lung disease) (HCC)    Lichen planopilaris    MVP (mitral valve prolapse)    documented on 2 D ECHO   Restless legs    Urticaria     Past Surgical History:  Procedure Laterality Date   APPENDECTOMY     with TAH   CARDIAC CATHETERIZATION  1989   negative   COLONOSCOPY  2005   Dr Juanda Chance   COLONOSCOPY  01/2013   Dr Loreta Ave   esophageal dilation  07/04/2011   Dr Neita Garnet SIGMOIDOSCOPY  1999  Dr Juanda Chance   KNEE ARTHROSCOPY  2001, 2005   Dr Eulah Pont   bilat   TOTAL ABDOMINAL HYSTERECTOMY     for Endometriosis; no BSO, Dr Arletha Grippe   UPPER GI ENDOSCOPY  01/2013   Dr Loreta Ave   VIDEO BRONCHOSCOPY Bilateral 02/05/2019   Procedure: VIDEO BRONCHOSCOPY WITH FLUORO;  Surgeon: Kalman Shan, MD;  Location: [facility] END[facility]COPY;  Service: Endoscopy;  Laterality: Bilateral;    Social History   Socioeconomic History   Marital status: Married    Spouse name: Fayrene Fearing   Number of children: 2   Years of education: Not on file   Highest education level: Not on file  Occupational History   Occupation: retired from [employer] retired  Tobacco Use   Smoking status: Never   Smokeless tobacco: Never  Vaping Use    Vaping status: Never Used  Substance and Sexual Activity   Alcohol use: Yes    Comment:  rarely   Drug use: No   Sexual activity: Yes  Other Topics Concern   Not on file  Social History Narrative   Not on file   Social Determinants of Health   Financial Resource Strain: Low Risk  (03/27/2022)   Overall Financial Resource Strain (CARDIA)    Difficulty of Paying Living Expenses: Not hard at all  Food Insecurity: No Food Insecurity (03/27/2022)   Hunger Vital Sign    Worried About Running Out of Food in the Last Year: Never true    Ran Out of Food in the Last Year: Never true  Transportation Needs: No Transportation Needs (03/27/2022)   PRAPARE - Administrator, Civil Service (Medical): No    Lack of Transportation (Non-Medical): No  Physical Activity: Sufficiently Active (03/27/2022)   Exercise Vital Sign    Days of Exercise per Week: 6 days    Minutes of Exercise per Session: 50 min  Stress: No Stress Concern Present (03/27/2022)   [employer] of Occupational Health - Occupational Stress Questionnaire    Feeling of Stress : Not at all  Social Connections: Moderately Isolated (03/27/2022)   Social Connection and Isolation Panel [NHANES]    Frequency of Communication with Friends and Family: More than three times a week    Frequency of Social Gatherings with Friends and Family: More than three times a week    Attends Religious Services: Never    Database administrator or Organizations: No    Attends Banker Meetings: Never    Marital Status: Married  Catering manager Violence: Not At Risk (03/27/2022)   Humiliation, Afraid, Rape, and Kick questionnaire    Fear of Current or Ex-Partner: No    Emotionally Abused: No    Physically Abused: No    Sexually Abused: No    Family History  Problem Relation Age of Onset   Lung cancer Father        Asbestos with mesothelioma   Heart disease Paternal Grandmother        ? etiology   Diabetes Paternal Grandmother     Diabetes Brother    Uterine cancer Paternal Aunt    Stroke Paternal Grandfather        > 55   Heart attack Brother 66   Allergies Sister    Aneurysm Mother        congenital   Stroke Brother        two strokes   Barrett's esophagus Brother     ROS: no fevers or chills, productive cough, hemoptysis,  dysphasia, odynophagia, melena, hematochezia, dysuria, hematuria, rash, seizure activity, orthopnea, PND, pedal edema, claudication. Remaining systems are negative.  Physical Exam: Well-developed well-nourished in no acute distress.  Skin is warm and dry.  HEENT is normal.  Neck is supple.  Chest minimal basilar crackles Cardiovascular exam is regular rate and rhythm.  Abdominal exam nontender or distended. No masses palpated. Extremities show no edema. neuro grossly intact  EKG Interpretation Date/Time:  Monday August 27 2022 07:59:17 EDT Ventricular Rate:  60 PR Interval:  164 QRS Duration:  82 QT Interval:  426 QTC Calculation: 426 R Axis:   6  Text Interpretation: Normal sinus rhythm Nonspecific ST abnormality When compared with ECG of 22-Jan-2007 06:34, No significant change was found Confirmed by [name] ([id number]) on 08/27/2022 8:04:41 AM    A/P  1 coronary artery disease-minimal nonobstructive disease noted on previous CTA.  Patient denies chest pain.  Continue aspirin and statin.  2 hyperlipidemia-continue statin.  Check lipids and liver.  3 history of dyspnea-this is felt secondary to interstitial lung disease which is being managed by pulmonary.  [name], MD

## 2022-08-16 ENCOUNTER — Ambulatory Visit: Payer: Medicare Other | Admitting: Internal Medicine

## 2022-08-27 ENCOUNTER — Ambulatory Visit: Payer: Medicare Other | Attending: Cardiology | Admitting: Cardiology

## 2022-08-27 ENCOUNTER — Encounter: Payer: Self-pay | Admitting: Cardiology

## 2022-08-27 VITALS — BP 132/82 | HR 60 | Ht 66.0 in | Wt 137.6 lb

## 2022-08-27 DIAGNOSIS — I2510 Atherosclerotic heart disease of native coronary artery without angina pectoris: Secondary | ICD-10-CM | POA: Diagnosis not present

## 2022-08-27 DIAGNOSIS — R0602 Shortness of breath: Secondary | ICD-10-CM | POA: Diagnosis not present

## 2022-08-27 DIAGNOSIS — E782 Mixed hyperlipidemia: Secondary | ICD-10-CM | POA: Diagnosis not present

## 2022-08-27 DIAGNOSIS — I2584 Coronary atherosclerosis due to calcified coronary lesion: Secondary | ICD-10-CM | POA: Diagnosis not present

## 2022-08-27 DIAGNOSIS — I739 Peripheral vascular disease, unspecified: Secondary | ICD-10-CM

## 2022-08-27 NOTE — Patient Instructions (Signed)
Medication Instructions:  - No changes  *If you need a refill on your cardiac medications before your next appointment, please call your pharmacy*   Lab Work: - Lipids and LFTs ordered today  If you have labs (blood work) drawn today and your tests are completely normal, you will receive your results only by: MyChart Message (if you have MyChart) OR A paper copy in the mail If you have any lab test that is abnormal or we need to change your treatment, we will call you to review the results.   Testing/Procedures: - None ordered   Follow-Up: At [facility], you and your health needs are our priority.  As part of our continuing mission to provide you with exceptional heart care, we have created designated Provider Care Teams.  These Care Teams include your primary Cardiologist (physician) and Advanced Practice Providers (APPs -  Physician Assistants and Nurse Practitioners) who all work together to provide you with the care you need, when you need it.  We recommend signing up for the patient portal called "MyChart".  Sign up information is provided on this After Visit Summary.  MyChart is used to connect with patients for Virtual Visits (Telemedicine).  Patients are able to view lab/test results, encounter notes, upcoming appointments, etc.  Non-urgent messages can be sent to your provider as well.   To learn more about what you can do with MyChart, go to [email or web address].    Your next appointment:   1 year(s)  Provider:   [name], MD

## 2022-09-11 DIAGNOSIS — L2089 Other atopic dermatitis: Secondary | ICD-10-CM | POA: Diagnosis not present

## 2022-09-11 DIAGNOSIS — D692 Other nonthrombocytopenic purpura: Secondary | ICD-10-CM | POA: Diagnosis not present

## 2022-09-11 DIAGNOSIS — L661 Lichen planopilaris: Secondary | ICD-10-CM | POA: Diagnosis not present

## 2022-09-13 DIAGNOSIS — M5417 Radiculopathy, lumbosacral region: Secondary | ICD-10-CM | POA: Diagnosis not present

## 2022-09-17 ENCOUNTER — Encounter: Payer: Self-pay | Admitting: Internal Medicine

## 2022-09-17 ENCOUNTER — Ambulatory Visit (INDEPENDENT_AMBULATORY_CARE_PROVIDER_SITE_OTHER): Payer: Medicare Other | Admitting: Internal Medicine

## 2022-09-17 ENCOUNTER — Ambulatory Visit: Payer: Medicare Other | Admitting: Internal Medicine

## 2022-09-17 VITALS — BP 124/70 | HR 54 | Temp 97.8°F | Ht 66.0 in | Wt 137.8 lb

## 2022-09-17 DIAGNOSIS — J849 Interstitial pulmonary disease, unspecified: Secondary | ICD-10-CM | POA: Diagnosis not present

## 2022-09-17 DIAGNOSIS — J84112 Idiopathic pulmonary fibrosis: Secondary | ICD-10-CM | POA: Diagnosis not present

## 2022-09-17 LAB — CBC WITH DIFFERENTIAL/PLATELET
Basophils Absolute: 0.1 10*3/uL (ref 0.0–0.1)
Basophils Relative: 1 % (ref 0.0–3.0)
Eosinophils Absolute: 0.3 10*3/uL (ref 0.0–0.7)
Eosinophils Relative: 3.2 % (ref 0.0–5.0)
HCT: 42.6 % (ref 36.0–46.0)
Hemoglobin: 13.6 g/dL (ref 12.0–15.0)
Lymphocytes Relative: 31.6 % (ref 12.0–46.0)
Lymphs Abs: 2.5 10*3/uL (ref 0.7–4.0)
MCHC: 31.9 g/dL (ref 30.0–36.0)
MCV: 91.1 fl (ref 78.0–100.0)
Monocytes Absolute: 0.6 10*3/uL (ref 0.1–1.0)
Monocytes Relative: 7.8 % (ref 3.0–12.0)
Neutro Abs: 4.4 10*3/uL (ref 1.4–7.7)
Neutrophils Relative %: 56.4 % (ref 43.0–77.0)
Platelets: 232 10*3/uL (ref 150.0–400.0)
RBC: 4.68 Mil/uL (ref 3.87–5.11)
RDW: 15.1 % (ref 11.5–15.5)
WBC: 7.8 10*3/uL (ref 4.0–10.5)

## 2022-09-17 LAB — PULMONARY FUNCTION TEST
DL/VA % pred: 62 %
DL/VA: 2.52 ml/min/mmHg/L
DLCO cor % pred: 45 %
DLCO cor: 8.8 ml/min/mmHg
DLCO unc % pred: 45 %
DLCO unc: 8.8 ml/min/mmHg
FEF 25-75 Pre: 1.93 L/s
FEF2575-%Pred-Pre: 136 %
FEV1-%Pred-Pre: 108 %
FEV1-Pre: 2.18 L
FEV1FVC-%Pred-Pre: 106 %
FEV6-%Pred-Pre: 108 %
FEV6-Pre: 2.76 L
FEV6FVC-%Pred-Pre: 105 %
FVC-%Pred-Pre: 103 %
FVC-Pre: 2.78 L
Pre FEV1/FVC ratio: 78 %
Pre FEV6/FVC Ratio: 99 %

## 2022-09-17 LAB — BRAIN NATRIURETIC PEPTIDE: Pro B Natriuretic peptide (BNP): 129 pg/mL — ABNORMAL HIGH (ref 0.0–100.0)

## 2022-09-17 NOTE — Progress Notes (Signed)
IOV 12/04/2017: Dr Delton Coombes: Ms. Knippa is a 82 year old never smoker with a history of allergic rhinitis, GERD, hypothyroidism, MVP.  She is been seen in our office in the past by Dr. Shelle Iron for restless leg syndrome-she still treated for this with Requip. On allegra, nexium prn.   She is referred today for dyspnea.  She reports that she has had an exercise routine where she walked w her husband 2 miles, exercises at the [facility], for several years. She has noticed more SOB, especially with mild hills. Sometimes now has to stop briefly to complete the walk. Occasional chest tightness, no wheeze. No overt CP. She reports daily cough, often in the am, sometimes clear mucous. She is on allegra. She had a walking stress test 09/2014 > reassuring ECG w exercise. Weight has been stable. Her last TSH was 06/25/17, 0.73.   Review of her notes shows hx possible GGI on prior CT 2014.   ROV 02/26/18 [name] --81 year old never smoker with a history of mitral valve prolapse whom I saw earlier this year for exertional dyspnea and chronic cough.  Pulmonary function testing consistent with restriction.  She has GERD and allergic rhinitis both of which could be contributing to her chronic cough.  I performed a high-resolution CT scan of the chest on 1/20 and reviewed today, this shows some patchy confluent subpleural reticular disease and groundglass attenuation with some minimal traction bronchiectasis and no frank honeycomb change.  This reflects a progression compared with 2014 and is an NSIP pattern.  Walking oximetry at her last visit did not show any exertional desaturation. She is still coughing, is using fexofenadine, takes nexium qod.   [place], had [place] exposure.   ROV 08/26/2018 [name] --follow-up visit for 82 year old woman with a history of mitral valve prolapse, chronic cough, dyspnea with restrictive lung disease noted on pulmonary function testing.  High Res CT scan of the chest  shows some mild interstitial disease.  She was also having cough - better with flonase; still on nexium, allegra. Her exertional tolerance is improved, she has been exercising more. She remains on Requip for RLS.   We performed autoimmune labs last time, ANA positive at low titer (1: 80), SSA and SSB negative, RNP negative, RF negative, CCP negative, Anti-Smith negative, anti-SCL negative, DS DNA negative, anti-Jo negative, aldolase negative. She hasn't been seen in ILD clinic yet.    OV 10/08/2018  Subjective:  Patient ID: Renee Rival, female , DOB: 04-29-1940 , age 57 y.o. , MRN: [id number] , ADDRESS: [address] [place] [address]   10/08/2018 -   Chief Complaint  Patient presents with   Interstitial Lung Disease    Breathing the same as it was during August 2020 office visit with Dr. Delton Coombes     HPI Ricke Hey Kable 82 y.o. -has been referred to the interstitial lung disease clinic because of findings of interstitial lung disease.  History is gathered from talking to her, review of Dr. Leslye Peer notes and also the integrated ILD questionnaire.  Briefly, she tells me that she was working out at [employer] and would just notice occasional dyspnea but she really did not compare it with other people.  Then in October 2019 she started walking 3 miles daily except on the days it rains with a husband.  During this time she noticed that she was falling really behind because of shortness of breath and exertional fatigue.  This resulted  in subsequent evaluation all documented above.  Findings of interstitial lung disease with subpleural reticulation suggestive of an alternative diagnosis.  Autoimmune profile essentially negative other than trace positive ANA.  She tells me that her significant major problem is exertional dyspnea when walking stairs or walking several miles.  She did not desaturate in our office several months ago.  She does not know if she desaturates when she exerts  walking 3 miles.  Pulmonary function test earlier this year is just isolated low DLCO.  She also has like a cough.  Overall since the onset of the symptoms by exercising herself more and conditioning she is somewhat better.  She did see Dr. [name] in May 2020 and in June 2020 had a coronary calcium CT which appears to have no calcium deposits.   [place] Integrated Comprehensive ILD Questionnaire  Symptoms:  -Dyspnea started suddenly and since it started it is better.  She says it is been present for years although she did tell me that it is only there since October 2019 when she noticed that.  Severity is listed below.  She does have a cough almost 1 year.  Since it started it is better.  It is mostly in the morning.  She does bring up some phlegm.  Early on in the morning it is green or yellow.  Since it started it is the same/better.  There is no wheezing.  She does have some chest tightness with this when she walks.  It is relieved by rest.  Cardiac work-up in June 2020 showed no calcium deposits.   Past Medical History : Positive for chronic longstanding acid reflux disease and thyroid disease..  In addition CT scan from January 2020 shows hiatal hernia that is small..  This presence of  sclerosis in the bony structures in January 2020.-Primary care physician has been sent a message today.  There is no asthma or COPD or heart failure rheumatoid arthritis or collagen vascular disease.  She does have GERD and hiatal hernia for several years to decades.  No sleep apnea.  No blood clots.  No hepatitis.  No tuberculosis.  No pleurisy.  , ANA positive at low titer (1: 80), SSA and SSB negative, RNP negative, RF negative, CCP negative, Anti-Smith negative, anti-SCL negative, DS DNA negative, anti-Jo negative, aldolase negative. She hasn't been seen in ILD clinic yet.    ROS:  -She does have fatigue for the last several years.  She does have some back and hip issues.  She does have dry eyes.  She  does have like some dysphagia.  There is presence of hiatal hernia-there is a small.  Acid reflux for several decades.  She also reports presence of nonspecific rash   FAMILY HISTORY of LUNG DISEASE: * -Her father died of mesothelioma in 59 83/1984 at the age of 4 otherwise no lung disease.   EXPOSURE HISTORY:   -When she was 17 she smokes cigarettes but otherwise no cigarette or tobacco use or electronic cigarette.  Never smoked marijuana.  No cocaine use no intravenous drug use.   HOME and HOBBY DETAILS :  -Single[place]family home [place] setting for the last 16 years in a 81 year old home.  No mold or mildew exposure in the [facility] duct or CPAP mask or humidifier.  No mold or mildew in the bathroom.  No pet birds in the house.  No misting [place].  No feather pillows no feather duvet.  No musical instruments.  She does some occasional gardening which  she likes.  She does do some fine-needle work.   OCCUPATIONAL HISTORY (122 questions) :  = Essentially negative except for the fact when she was a child she did some tobacco growing.  She has done home gardening for 50 years.    PULMONARY TOXICITY HISTORY (27 items):  denies  Results for CAMMY, KAMLER (MRN [id number]) as of 11/07/2018 11:20  Ref. Range 02/26/2018 12:17  Anti Nuclear Antibody (ANA) Latest Ref Range: NEGATIVE  POSITIVE (A)  ANA Pattern 1 Unknown Nuclear, Speckled (A)  ANA Titer 1 Latest Units: titer 1:80 (H)  Anti JO-1 Latest Ref Range: 0.0 - 0.9 AI <0.2  Cyclic Citrullin Peptide Ab Latest Units: UNITS <16  ds DNA Ab Latest Units: IU/mL <1  RA Latex Turbid. Latest Ref Range: <14 IU/mL <14  ENA SM Ab Ser-aCnc Latest Ref Range: <1.0 NEG AI <1.0 NEG  Ribonucleic Protein(ENA) Antibody, IgG Latest Ref Range: <1.0 NEG AI <1.0 NEG  SSA (Ro) (ENA) Antibody, IgG Latest Ref Range: <1.0 NEG AI <1.0 NEG  SSB (La) (ENA) Antibody, IgG Latest Ref Range: <1.0 NEG AI <1.0 NEG  Scleroderma (Scl-70) (ENA) Antibody, IgG Latest Ref Range: <1.0  NEG AI <1.0 NEG   Results for MAHIYAH, MARSTON (MRN [id number]) as of 11/07/2018 11:20  Ref. Range 01/29/2018 14:31  FVC-Pre Latest Units: L 2.97  FVC-%Pred-Pre Latest Units: % 98  Results for CELA, MCGILBERRY (MRN [id number]) as of 11/07/2018 11:20  Ref. Range 01/29/2018 14:31  DLCO unc Latest Units: ml/min/mmHg 14.80  DLCO unc % pred Latest Units: % 54    OV 11/07/2018  Subjective:  Patient ID: Renee Rival, female , DOB: 10/31/1940 , age 71 y.o. , MRN: [id number] , ADDRESS: [address] [place] [address]   11/07/2018 -   Chief Complaint  Patient presents with   Follow-up    Patient reports that she has sob with any exertion.    Follow-up interstitial lung disease  HPI PAIZLI WESTERLUND 82 y.o. -last seen September 2020.  After that she was supposed to have follow-up high-resolution CT chest and spirometry DLCO.  For some reason the spirometry DLCO is not done.  In the interim her symptoms remain the same as shown by the symptom score below.  She does note when she does heavy exertion such as climbing stairs or long uphill walks her pulse ox drops to 84% but quickly regains.  She is not interested in portable oxygen.  She had a repeat high-resolution CT chest in September 2020 and when compared to January 2020 there is no significant change.  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  I personally visualized the film.  There is diffuse bilateral subpleural reticulation and some traction bronchiectasis.  I myself would say it may be this is indeterminate for UIP.  But there is some minimal air trapping and she is done some gardening work.  There is no upper zonal predominance that would fit in with hypersensitive pneumonitis.  She has incidental findings of bony sclerosis that is again repeated in the CT scan.  Her primary care physician Cheryll Cockayne evaluated her.  I reviewed the note.  She is been  referred to Dr. Jeanie Sewer in hematology who she is seeing November 12, 2018.  She is very confused about the fact that she has a bony sclerosis problem for which she is being referred to a blood doctor [hematologist] and she thought she is  having a blood test that was ordered by me.  I reviewed the chart and clarified this concepts   Ct Chest High Resolution  Result Date: 11/05/2018 CLINICAL DATA:  82 year old female with history of interstitial lung disease. Increased shortness of breath and cough over the past year. EXAM: CT CHEST WITHOUT CONTRAST TECHNIQUE: Multidetector CT imaging of the chest was performed following the standard protocol without intravenous contrast. High resolution imaging of the lungs, as well as inspiratory and expiratory imaging, was performed. COMPARISON:  High-resolution chest CT 02/10/2018. FINDINGS: Cardiovascular: Heart size is normal. There is no significant pericardial fluid, thickening or pericardial calcification. There is aortic atherosclerosis, as well as atherosclerosis of the great vessels of the mediastinum and the coronary arteries, including calcified atherosclerotic plaque in the left anterior descending and right coronary arteries. Mild calcifications of the mitral annulus. Mediastinum/Nodes: No pathologically enlarged mediastinal or hilar lymph nodes. Please note that accurate exclusion of hilar adenopathy is limited on noncontrast CT scans. Esophagus is unremarkable in appearance. No axillary lymphadenopathy. Lungs/Pleura: High-resolution images demonstrates some patchy areas of peripheral predominant septal thickening and subpleural reticulation, with mild cylindrical bronchiectasis and peripheral bronchiolectasis. No frank honeycombing confidently identified at this time. These findings have no definitive craniocaudal gradient. In the periphery of the mid to upper lungs there also some plaque-like areas of apparent pleuroparenchymal scarring and volume loss.  Inspiratory and expiratory imaging demonstrates minimal air trapping indicative of very mild small airways disease. Overall, these imaging findings appear stable compared to the prior study. Upper Abdomen: Aortic atherosclerosis. Musculoskeletal: Mild diffuse sclerosis throughout the visualized axial and appendicular skeleton, similar to prior examinations. There are no definite focal aggressive appearing lytic or blastic lesions noted in the visualized portions of the skeleton. IMPRESSION: 1. 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 2. Aortic atherosclerosis, in addition to 2 vessel coronary artery disease. Assessment for potential risk factor modification, dietary therapy or pharmacologic therapy may be warranted, if clinically indicated. 3. Persistent mild diffuse sclerosis throughout the visualized osseous structures without discrete aggressive appearing osseous lesions. Clinical correlation for signs and symptoms of potential infiltrative process such as myelofibrosis is suggested. Aortic Atherosclerosis (ICD10-I70.0). Electronically Signed   By: Trudie Reed M.D.   On: 11/05/2018 14:36     OV 12/22/2018  Subjective:  Patient ID: Renee Rival, female , DOB: Oct 02, 1940 , age 44 y.o. , MRN: [id number] , ADDRESS: [address] [place] [address]   12/22/2018 -   Chief Complaint  Patient presents with   Follow-up    Pt states she has been doing good since last visit. Pt is still coughing and will get up clear mucus in the morning.   Follow-up interstitial lung disease with CT scan October 2020 being indeterminate versus alternate diagnosis.  History of gardening.  Trace  autoimmune ANA positive.  Mild progression since 2014  HPI RUIE WOLFROM 82 y.o. -returns for follow-up.  She presents with her husband who I am meeting for the first time.  In the interim she met Dr. Dorris Fetch thoracic surgeon for surgical lung biopsy.  Her husband also met with him.  I reviewed the note.  She tells me that given the morbidity with surgical lung biopsy she wants to  undergo bronchoscopy with lavage and transbronchial biopsy first.  She understands the inherent limitations of these procedure in terms of diagnosis.  But she wants to take the lower risk profile.  Overall she feels stable.  Her symptom score is improved compared to the past.  Her walking desaturation test shows exaggerated drop in pulse ox but this was done with her wearing the mask.  She did not feel any dyspnea.  In terms of her bony sclerosis she has seen Dr. Leonides Schanz at [facility].  I reviewed the note.  He has reassured her.  Risks of pneumothorax, hemothorax, sedation/anesthesia complications such as cardiac or respiratory arrest or hypotension, stroke and bleeding all explained. Benefits of diagnosis but limitations of non-diagnosis also explained. Patient verbalized understanding and wished to proceed.   They want to have the bronchoscopy after the holidays of Christmas and new year.  This is because their house is undergoing remodeling currently.       OV 03/16/2019  Subjective:  Patient ID: Renee Rival, female , DOB: 1940-09-13 , age 46 y.o. , MRN: [id number] , ADDRESS: [address] [place] [address]   03/16/2019 -   Chief Complaint  Patient presents with   Follow-up    PFT performed today.  Pt states she has been doing okay since last visit and states her breathing is about the same.     Finally able to review esults of [facility]ia send out test to [place]. Date of test is 02/05/2019. 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 -returns for follow-up to discuss  bronchoscopy and lavage results.  In the interim no new respiratory issues.  They are all stable.  However she is having significant musculoskeletal issues with shoulder pain.  Serology from a year ago was negative.  They wanted to know about relatedness.  I told him it is probably not related.  We went over the bronchoscopy lavage results which showed some dominance of neutrophils consistent with UIP.  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   We had a long discussion about the benefits, risks and limitations of antifibrotic therapy.  We also discussed the choice between pirfenidone and nintedanib.       Results for CASIDY, TAUBMAN (MRN [id number]) as of 03/16/2019 11:12  Ref. Range 01/29/2018 14:31 03/16/2019 08:42  FVC-Pre Latest Units: L 2.97 2.91  FVC-%Pred-Pre Latest Units: % 98 97   Results for KORYNNE, GALICA (MRN [id number]) as of 03/16/2019 11:12  Ref. Range 01/29/2018 14:31 03/16/2019 08:42  DLCO unc Latest Units: ml/min/mmHg 14.80 12.32  DLCO unc % pred Latest Units: % 54 60     OV 06/11/2019 - telephine visit. Patient identified with 2 pHI, risks, benefit, limitations of tele visit explained  Subjective:  Patient ID: Renee Rival, female , DOB: 1940-08-11 , age 51 y.o. , MRN: [id number] , ADDRESS: [address] [place] [address] ate of test is 02/05/2019. 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 -in this telephone visit patient is now on pirfenidone.  She says she is on pirfenidone since mid April 2021.  She is on 3 pills 3 times a day.  She spacing them  4 hours apart but having some intermittent nausea every few days.  She did have an urticaria but that was before she started pirfenidone was related to Covid vaccine  that is now resolved.  She is asking about exercising at the [facility] and Covid precautions.  I advised her because she is vaccinated that the risk is low but not 0 and to take adequate precautions as tolerated.  She had liver function test and this is normal.  Her next appointment for pulmonary function test is in mid July 2021.  Recommended she make a face-to-face visit with me at that time.   OV 08/21/2019   Subjective:  Patient ID: Renee Rival, female , DOB: 12/01/1940, age 67 y.o. years. , MRN: [id number],  ADDRESS: [address] [place] [address] PCP  Pincus Sanes, MD Providers : Treatment Team:  Attending Provider: Kalman Shan, MD  Type of visit: Telephone Circumstance: COVID-19 national emergency Identification of patient Renee Rival - 2 person identifier Risks: Risks, benefits, limitations of telephone visit explained Patient location: home This provider location: [facility]      Chief Complaint  Patient presents with   Follow-up    PFT 7/27--c/o sob with stairs and throat clearing mainly in the morning. stopped Esbriet on 08/12/2019.   Follow-up interstitial lung disease with CT scan October 2020 being indeterminate versus alternate diagnosis.  History of gardening.  Trace autoimmune ANA positive.  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 -presents for this telephone visit for IPF.  On this telephone visit she was identified with 2 person identifier.  Risks, benefits and limitations of telephone visit explained.  After last visit her GI symptoms worsen.  She did see Dr. Camillia Herter gastroenterologist.  She underwent endoscopy.  Dr. Loreta Ave did send the results to me I do not have this.  I remember Dr. Loreta Ave calling me and discussing  patient's GI side effects.  These were from pirfenidone so on August 12, 2019 based on my advise she start pirfenidone.  Since then her symptoms of GI nature have resolved.  Her respiratory symptoms continue to be stable.  These are all documented below.  At this point in time she feels that her quality of life is very important.  She does not want to go through the kind of GI side effect she went through with pirfenidone.  She would rather deal with the disease.  She is aware that nintedanib is a standard of care therapeutic option.  She is also aware after discussion the clinical trials as a care option particularly in the past to discovering new therapies that do not have this GI side effects that pirfenidone poses.  She had a lot of questions about Covid vaccine.  She is fully vaccinated.  She is worried about her grandson was refusing the vaccine.      OV 09/29/2019   Subjective:  Patient ID: Renee Rival, female , DOB: 04-02-1940, age 87 y.o. years. , MRN: [id number],  ADDRESS: [address] [place] [address] PCP  Pincus Sanes, MD Providers : Treatment Team:  Attending Provider: Kalman Shan, MD   Chief Complaint  Patient presents with   Follow-up    LFT today?  pulmonary rehab or exercises.  inhaler trial, felt better after Advair.   Follow-up interstitial lung disease/IPF with CT scan October 2020 being indeterminate versus alternate diagnosis.  History of gardening.  Trace autoimmune ANA positive.  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 Date of test is 02/05/2019. 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  -Date of giving diagnosis is March 16, 2019  - MDD 05/05/19  - esbriet since 05/06/19- stopped 08/12/19 due to severe GI side effect    HPI EXILDA PETTINATO 82 y.o. -    presesnts  With her husband for IPF followup.  Overall she is doing  well.  She is not taking pirfenidone since July 2021.  After that she has gained weight.  All her GI symptoms have resolved.  She goes for daily walks.  She says when she reaches the top of the hill sometimes she desaturates to 88% but not always.  She monitors this closely.  She is asking for other exercises that can improve her endurance and lung function.  We discussed that exercises do not improve lung function.  She is willing to participate in pulmonary rehabilitation.  We discussed the alternative of taking nintedanib but because of the severe GI side effects that she had with Esbriet she is nervous about starting nintedanib and does not want to.  I introduced her to the concept of clinical trials as a care option.  At this point in time she wants to process this information.  We did discuss the fact that IPF is a progressive disease and antifibrotic's are indicated early in the course of the disease.  She prefers to have supportive care approach.  She wants to focus on strengthening her overall physical shape.   OV 06/30/2021 -standard of care visit in this research patient.  Subjective:  Patient ID: Renee Rival, female , DOB: 11/07/1940 , age 82 y.o. , MRN: [id number] , ADDRESS: [address] [place] [address] PCP Pincus Sanes, MD Patient Care Team: Pincus Sanes, MD as PCP - General (Internal Medicine) Jens Som Madolyn Frieze, MD as Consulting Physician (Cardiology) Leslye Peer, MD as Consulting Physician (Pulmonary Disease) Kathyrn Sheriff, [facility] as Pharmacist (Pharmacist) Christia Reading, MD as Consulting Physician (Otolaryngology) [facility] as Consulting Physician (Ophthalmology)  This Provider for this visit: Treatment Team:  Attending Provider: Kalman Shan, MD    06/30/2021 -   Chief Complaint  Patient presents with   Follow-up    Pt recently had a CT of her sinuses and is here to discuss the results.  Pt states she has not been  feeling well and states she is still having problems with her sinuses. States her breathing has been doing okay.     HPI ANALYCE DILAURA 82 y.o. -on April 19, 2021 she did have some side effects from inhaled medical product.  After that we reduce the dose and slowly escalated back and then she was tolerating it fine.  On 05/22/2021 she was prescribed antibiotics for some sinus issues she did have week 8 study visit on 06/01/2021 and was doing okay.  But then on 06/20/2021 she started complaining of headache and sinus congestion and coughing and blowing yellowish mucus.  At this point in time she was taking inhaled treprostinil versus placebo investigational medical product at 4 times daily each time 12 puffs.  We told  her to reduce it to 9 puffs 4 times daily.  She tells me that perhaps she is marginally better after reducing the dose but she still having headaches.  She feels like her head is full particularly in the top part.  She also has significant cough.  She says during and after she takes the inhaled medical investigational product her cough score is 9 out of 10.  Rest of the day it is around 3 out of 10.  She coughs so much that she sometimes brings out mild yellow mucus that is like glue.  There is no diarrhea fatigue or weight loss or syncope or hemoptysis.  Definitely all the symptoms only after starting the research protocol.  She also some sore throat.  She has tried some remedial measures but these have not helped.  These include yogurt and ginger ale  For the dyspnea itself she feels stable.  She also tells me that she is able to walk a mile.  She is able to outplace her husband.  She feels her husband's declining health.  She says when she walks a mile she puts a finger probe after she rests and the pulse ox is 94%.  She is worried she might desaturate below 88%.  She does not have a forehead pulse oximeter.  She says the finger pulse ox meter does not stay on while she is walking.  I gave her a  contact of the patient support group leader to figure out a way to get a forehead probe.  Also showed her various finger proximal she can get that can stay on her finger while she exercises.  Told her the safety limit is greater than 88%.  Symptom scores below show that her symptoms are better than 2 years ago slightly.  This is from a respiratory standpoint with a cough is definitely worse after she went on the study.  And the headache is new.  She had a CT scan sinus that shows chronic findings.      OV 05/04/2022  Subjective:  Patient ID: Renee Rival, female , DOB: May 06, 1940 , age 25 y.o. , MRN: [id number] , ADDRESS: [address] [place] [address] PCP Pincus Sanes, MD Patient Care Team: Pincus Sanes, MD as PCP - General (Internal Medicine) Jens Som Madolyn Frieze, MD as Consulting Physician (Cardiology) Leslye Peer, MD as Consulting Physician (Pulmonary Disease) Kathyrn Sheriff, [facility] as Pharmacist (Pharmacist) Christia Reading, MD as Consulting Physician (Otolaryngology) [facility] as Consulting Physician (Ophthalmology)  This Provider for this visit: Treatment Team:  Attending Provider: Kalman Shan, MD  05/04/2022 -   Chief Complaint  Patient presents with   Follow-up    F/u after researched      HPI Ricke Hey Gracia 82 y.o. -returns for routine follow-up.  In the interim she was on inhaled treprostinil versus placebo but she did not tolerate it at all.  She had all the classic side effects of the renal drug.  We made multiple attempts in holiday and giving the drug back but she did not tolerate it at all and she finally quit.  She is out of the study now.  She did do 1 year follow-up and exited the study.  Currently she is on supportive care.  She walks on Monday through Friday 3 miles a day.  This is through [facility] and she has gotten the best walker award.  She does have some chest burning when she starts the  walking but it  does get better as she walks further.  She is not limited by dyspnea.  She checks her pulse ox after the walk a few seconds later and the pulse ox is 93% 94% at the lowest.  I did tell her to monitor it while she walks.  In terms with therapy she is not interested in standard of care antifibrotic therapy because of side effect profile.  She tried pirfenidone in the past.  She is also been intolerant to treprostinil.  She is absolutely not interested in nintedanib.  We talked about doing the revert study sponsored by [facility].  Is an oral pill.  She read the consent form which we gave it to her some weeks ago.  She does not want to do the study because of side effect profile.  There is another study called MOONSCAPE by [place].  Is an injection study.  Current patients are tolerating the study well so far.  She might be interested in this..  Will probably aim to recruit and Q3  2024.    OV 09/17/2022  Subjective:  Patient ID: Renee Rival, female , DOB: Jan 28, 1940 , age 36 y.o. , MRN: [id number] , ADDRESS: [address] [place] [address] PCP Pincus Sanes, MD Patient Care Team: Pincus Sanes, MD as PCP - General (Internal Medicine) Jens Som Madolyn Frieze, MD as PCP - Cardiology (Cardiology) Jens Som Madolyn Frieze, MD as Consulting Physician (Cardiology) Leslye Peer, MD as Consulting Physician (Pulmonary Disease) Kathyrn Sheriff, [facility] (Inactive) as Pharmacist (Pharmacist) Christia Reading, MD as Consulting Physician (Otolaryngology) [facility] as Consulting Physician (Ophthalmology)  This Provider for this visit: Treatment Team:  Attending Provider: Kalman Shan, MD    09/17/2022 -   Chief Complaint  Patient presents with   Follow-up    Breathing is about the same. She denies any new co's. She is walking 3 miles daily.     Follow-up interstitial lung disease/IPF with CT scan October 2020 being indeterminate versus alternate diagnosis.  History of  gardening.  Trace autoimmune ANA positive.  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 Date of test is 02/05/2019. 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   HPI KYLANI CONYER 82 y.o. -presents for follow-up.  She is currently on supportive care.  She started off the visit by saying she was doing stable but later she did admit that when she does a 3 mile walk sometimes she gets dizziness but she is not checking her pulse ox but she is still able to finish her 3 miles at the same time.  She also when she does vacuuming she gets quite dyspneic.  No new medical problems other than the fact her leg and feet have chronic numbness and associated with restless legs. Interim Health status: No new complaints No new medical problems. No new surgeries. No ER visits. No Urgent  care visits. No changes to medications.  Symptom scores and x-rays hypoxemia test remained stable but a pulmonary function test today shows a drop in DLCO.  We discussed this.  In terms of treatment she is only on supportive care after having failed antifibrotic's and previous intolerance to inhaled treprostinil study drug.  She is looking at the injection based therapy via [employer] protocol.          SYMPTOM SCALE - ILD     Inhaled Tyvaso v Placebo. 05/04/2022 Supportive care  09/17/2022 Supporitve care  O2 use 10/08/2018  11/07/2018  12/22/2018  06/30/21     Shortness of Breath 0 -> 5 scale with 5 being worst (score 6 If unable to do)  ra ra ra  At rest 0 0 0 0 0  Simple tasks - showers, clothes change, eating, shaving 1 1 0 1 1  Household (dishes, doing bed, laundry) 2 2 2 3 1  for mos stuff to 5 with vaccuming (3)  Shopping 2 2 0.5 2 2   Walking level at own pace 2 1 0.5 1 1   Walking up Stairs 4 5 3  2.5 3.5  Total (40 - 48) Dyspnea Score 11 11 6  9.5 9.5  How bad is your cough? 1 3 2.5 5 3   How bad is your fatigue 2 2.5 1.5 am, 4.5 pm 4 2  nausea    1 0  vomit    0 00  diarrhea    0 0  anxiet    00 0  depression    0 0  pain    Headacne,, sinus complaints    Simple office walk 185 feet x  3 laps goal with forehead probe 06/30/2021  09/17/2022   O2 used ra ra  Number laps completed 3 Sit stand x 10  Comments about pace Fast pace   Resting Pulse Ox/HR 96% and 74/min 96% and HR 62  Final Pulse Ox/HR 96% and 117/min 94% and HR 77  Desaturated </= 88% no no  Desaturated <= 3% points n no  Got Tachycardic >/= 90/min yes no  Symptoms at end of test No complaints No compalints  Miscellaneous comments Sx       PFT     Latest Ref Rng & Units 09/17/2022   10:11 AM 03/04/2020    1:00 PM 09/15/2019    5:13 PM 03/16/2019    8:42 AM 01/29/2018    2:31 PM  ILD indicators  FVC-Pre L 2.78  P 3.07  2.92  2.91  2.97   FVC-Predicted Pre % 103  P 104  99  97  98   FVC-Post L     2.89   FVC-Predicted Post %     95   DLCO uncorrected ml/min/mmHg 8.80  P 12.94  12.84  12.32  14.80   DLCO UNC %Pred % 45  P 63  63  60  54   DLCO Corrected ml/min/mmHg 8.80  P 12.94  13.26  12.91    DLCO COR %Pred % 45  P 63  65  63      P Preliminary result      LAB RESULTS last 96 hours No results found.  LAB RESULTS last 90 days Recent Results (from the past 2160 hour(s))  Hepatic function panel     Status: None   Collection Time: 08/27/22  8:30 AM  Result Value Ref Range   Total Protein 6.7 6.0 - 8.5 g/dL   Albumin 4.5 3.7 - 4.7  g/dL   Bilirubin Total  0.6 0.0 - 1.2 mg/dL   Bilirubin, Direct 0.98 0.00 - 0.40 mg/dL   Alkaline Phosphatase 99 44 - 121 IU/L   AST 22 0 - 40 IU/L   ALT 14 0 - 32 IU/L  Lipid panel     Status: None   Collection Time: 08/27/22  8:30 AM  Result Value Ref Range   Cholesterol, Total 144 100 - 199 mg/dL   Triglycerides 119 0 - 149 mg/dL   HDL 59 >14 mg/dL   VLDL Cholesterol Cal 18 5 - 40 mg/dL   LDL Chol Calc (NIH) 67 0 - 99 mg/dL   Chol/HDL Ratio 2.4 0.0 - 4.4 ratio    Comment:                                   T. Chol/HDL Ratio                                             Men  Women                               1/2 Avg.Risk  3.4    3.3                                   Avg.Risk  5.0    4.4                                2X Avg.Risk  9.6    7.1                                3X Avg.Risk 23.4   11.0   Pulmonary function test     Status: None (Preliminary result)   Collection Time: 09/17/22 10:11 AM  Result Value Ref Range   FVC-Pre 2.78 L   FVC-%Pred-Pre 103 %   FEV1-Pre 2.18 L   FEV1-%Pred-Pre 108 %   FEV6-Pre 2.76 L   FEV6-%Pred-Pre 108 %   Pre FEV1/FVC ratio 78 %   FEV1FVC-%Pred-Pre 106 %   Pre FEV6/FVC Ratio 99 %   FEV6FVC-%Pred-Pre 105 %   FEF 25-75 Pre 1.93 L/sec   FEF2575-%Pred-Pre 136 %   DLCO unc 8.80 ml/min/mmHg   DLCO unc % pred 45 %   DLCO cor 8.80 ml/min/mmHg   DLCO cor % pred 45 %   DL/VA 7.82 ml/min/mmHg/L   DL/VA % pred 62 %         has a past medical history of Allergic rhinitis, GERD (gastroesophageal reflux disease), Glaucoma, Hiatal hernia, Hyperlipidemia, Hypothyroidism, ILD (interstitial lung disease) (HCC), Lichen planopilaris, MVP (mitral valve prolapse), Restless legs, and Urticaria.   reports that she has never smoked. She has never used smokeless tobacco.  Past Surgical History:  Procedure Laterality Date   APPENDECTOMY     with TAH   CARDIAC CATHETERIZATION  1989   negative   COLONOSCOPY  2005   Dr Juanda Chance   COLONOSCOPY  01/2013   Dr Loreta Ave   esophageal  dilation  07/04/2011  Dr Neita Garnet SIGMOIDOSCOPY  1999    Dr Juanda Chance   KNEE ARTHROSCOPY  2001, 2005   Dr Eulah Pont   bilat   TOTAL ABDOMINAL HYSTERECTOMY     for Endometriosis; no BSO, Dr Arletha Grippe   UPPER GI ENDOSCOPY  01/2013   Dr Loreta Ave   VIDEO BRONCHOSCOPY Bilateral 02/05/2019   Procedure: VIDEO BRONCHOSCOPY WITH FLUORO;  Surgeon: Kalman Shan, MD;  Location: [facility] ENDOSCOPY;  Service: Endoscopy;  Laterality: Bilateral;    Allergies  Allergen Reactions   Caffeine     palpitations   Chlorpheniramine-Pseudoeph     palpitations   Maxifed     palpitations   Other Other (See Comments)   Pirfenidone Nausea Only    Esbriet Causes nausea and weakness    Immunization History  Administered Date(s) Administered   Fluad Quad(high Dose 65+) 11/15/2020, 11/14/2021   Influenza Split 11/07/2010   Influenza Whole 10/30/2009, 11/27/2011   Influenza, High Dose Seasonal PF 10/27/2012, 11/05/2013, 10/01/2017, 09/11/2018, 11/23/2019   Influenza-Unspecified 11/24/2014, 10/07/2015, 10/08/2016   PFIZER(Purple Top)SARS-COV-2 Vaccination 03/14/2019, 04/06/2019, 10/27/2019   Pneumococcal Conjugate-13 10/07/2015   Pneumococcal Polysaccharide-23 07/02/2017   Respiratory Syncytial Virus Vaccine,Recomb Aduvanted(Arexvy) 12/27/2021   Tdap 02/11/2012   Zoster Recombinant(Shingrix) 10/01/2017, 12/03/2017   Zoster, Live 01/09/2012    Family History  Problem Relation Age of Onset   Lung cancer Father        Asbestos with mesothelioma   Heart disease Paternal Grandmother        ? etiology   Diabetes Paternal Grandmother    Diabetes Brother    Uterine cancer Paternal Aunt    Stroke Paternal Grandfather        > 55   Heart attack Brother 43   Allergies Sister    Aneurysm Mother        congenital   Stroke Brother        two strokes   Barrett's esophagus Brother      Current Outpatient Medications:    aspirin EC 81 MG tablet, Take 1 tablet (81 mg total) by mouth daily., Disp: 90  tablet, Rfl: 3   cetirizine (ZYRTEC) 10 MG tablet, Take 10 mg by mouth daily., Disp: , Rfl:    cholecalciferol (VITAMIN D3) 25 MCG (1000 UT) tablet, Take 1,000 Units by mouth daily., Disp: , Rfl:    clobetasol (TEMOVATE) 0.05 % external solution, , Disp: , Rfl:    esomeprazole (NEXIUM) 40 MG capsule, Take 40 mg by mouth every morning., Disp: , Rfl:    famotidine (PEPCID) 40 MG tablet, Take 1 tablet (40 mg total) by mouth daily., Disp: 90 tablet, Rfl: 1   Ferrous Sulfate Dried (SLOW RELEASE IRON) 45 MG TBCR, Take by mouth., Disp: , Rfl:    fluticasone (FLONASE) 50 MCG/ACT nasal spray, Place 1 spray into both nostrils daily as needed for allergies or rhinitis., Disp: , Rfl:    hydroxychloroquine (PLAQUENIL) 200 MG tablet, Take 1 tablet by mouth twice a day, Disp: 60 tablet, Rfl: 9   magnesium oxide (MAG-OX) 400 MG tablet, Take 400 mg by mouth daily., Disp: , Rfl:    Multiple Vitamins-Minerals (ZINC PO), Take 1 tablet by mouth., Disp: , Rfl:    omeprazole (PRILOSEC) 40 MG capsule, Take 40 mg by mouth daily as needed., Disp: , Rfl:    pravastatin (PRAVACHOL) 40 MG tablet, Take 1 tablet (40 mg total) by mouth daily., Disp: 90 tablet, Rfl: 2   Probiotic Product (PROBIOTIC ADVANCED) CAPS, Take by mouth., Disp: ,  Rfl:    rOPINIRole (REQUIP) 0.5 MG tablet, TAKE 2 TABLETS BY MOUTH EVERY NIGHT AFTER DINNER, Disp: 180 tablet, Rfl: 1   SYNTHROID 88 MCG tablet, TAKE 1 TABLET DAILY 30 MINUTES BEFORE BREAKFAST, Disp: 90 tablet, Rfl: 3   vitamin B-12 (CYANOCOBALAMIN) 500 MCG tablet, Take 500 mcg by mouth daily., Disp: , Rfl:       Objective:   Vitals:   09/17/22 1105  BP: 124/70  Pulse: (!) 54  Temp: 97.8 F (36.6 C)  TempSrc: Oral  SpO2: 98%  Weight: 137 lb 12.8 oz (62.5 kg)  Height: 5\' 6"  (1.676 m)    Estimated body mass index is 22.24 kg/m as calculated from the following:   Height as of this encounter: 5\' 6"  (1.676 m).   Weight as of this encounter: 137 lb 12.8 oz (62.5  kg).  @WEIGHTCHANGE @  [employer]   09/17/22 1105  Weight: 137 lb 12.8 oz (62.5 kg)     Physical Exam   General: No distress. Looks well O2 at rest: no Cane present: no Sitting in wheel chair: no Frail: no Obese: no Neuro: Alert and Oriented x 3. GCS 15. Speech normal Psych: Pleasant Resp:  Barrel Chest - no.  Wheeze - no, Crackles - Bilateral LL R > L, No overt respiratory distress CVS: Normal heart sounds. Murmurs - no Ext: Stigmata of Connective Tissue Disease - no HEENT: Normal upper airway. PEERL +. No post nasal drip        Assessment:       ICD-10-CM   1. IPF (idiopathic pulmonary fibrosis) (HCC)  J84.112      Cocnrned diesease is getting worse    Plan:     Patient Instructions     ICD-10-CM   1. IPF (idiopathic pulmonary fibrosis) (HCC)  J47.829       Pulmonary function test shows a decline particularly in diffusion capacity.  This could be because it is a fibrosis is worse or you have developed a condition called pulmonary hypertension or you might be anemic.  Review of her records indicate that you have not had anemia in over 10 years so I doubt anemia is a problem but nevertheless we need to rule this out to.  Plan - Get CBC, and BNP today -Get echocardiogram in the next few weeks [last one was a few years ago] -Get high-resolution CT chest [last 22 October 2020] -Monitor your oxygen levels with a finger pulse oximeter while you are doing a 3 mile walk  -You can get anyone device commercially that is around $20-$50 but make sure it is FDA approved  Follow-up - Next few to several weeks to see Dr. Marchelle Gearing but after completing the above [video visit fine]   FOLLOWUP Return in about 3 weeks (around 10/08/2022) for 30 min visit, after HRCT chest or echo, ILD, with Dr Marchelle Gearing, Face to Face OR Video Visit.    SIGNATURE    Dr. Kalman Shan, M.D., F.C.C.P,  Pulmonary and Critical Care Medicine Staff Physician, [facility]  Director - Interstitial Lung Disease  Program  Pulmonary Fibrosis [facility] at [facility] [place], [place], [address]  Pager: [phone], If no answer or between  15:00h - 7:00h: call [phone] Telephone: [phone]  11:47 AM 09/17/2022

## 2022-09-17 NOTE — Progress Notes (Signed)
Spiro/DLCO performed today. 

## 2022-09-17 NOTE — Patient Instructions (Signed)
Spiro/DLCO performed today. 

## 2022-09-17 NOTE — Patient Instructions (Addendum)
ICD-10-CM   1. IPF (idiopathic pulmonary fibrosis) (HCC)  Z61.096       Pulmonary function test shows a decline particularly in diffusion capacity.  This could be because it is a fibrosis is worse or you have developed a condition called pulmonary hypertension or you might be anemic.  Review of her records indicate that you have not had anemia in over 10 years so I doubt anemia is a problem but nevertheless we need to rule this out to.  Plan - Get CBC, and BNP today -Get echocardiogram in the next few weeks [last one was a few years ago] -Get high-resolution CT chest [last 22 October 2020] -Monitor your oxygen levels with a finger pulse oximeter while you are doing a 3 mile walk  -You can get anyone device commercially that is around $20-$50 but make sure it is FDA approved  Follow-up - Next few to several weeks to see Dr. Marchelle Gearing but after completing the above [video visit fine]  - we can discuss Moonscape study or other care options at followup visit

## 2022-09-18 ENCOUNTER — Other Ambulatory Visit: Payer: Self-pay | Admitting: Internal Medicine

## 2022-09-18 ENCOUNTER — Ambulatory Visit (HOSPITAL_COMMUNITY): Payer: Medicare Other | Attending: Cardiology

## 2022-09-18 DIAGNOSIS — R0600 Dyspnea, unspecified: Secondary | ICD-10-CM | POA: Diagnosis not present

## 2022-09-18 DIAGNOSIS — J84112 Idiopathic pulmonary fibrosis: Secondary | ICD-10-CM | POA: Insufficient documentation

## 2022-09-18 DIAGNOSIS — Z1231 Encounter for screening mammogram for malignant neoplasm of breast: Secondary | ICD-10-CM

## 2022-09-18 LAB — ECHOCARDIOGRAM COMPLETE
Area-P 1/2: 3.21 cm2
S' Lateral: 3.2 cm

## 2022-09-20 ENCOUNTER — Ambulatory Visit
Admission: RE | Admit: 2022-09-20 | Discharge: 2022-09-20 | Disposition: A | Discharge status: Home / Self Care | Payer: Medicare Other | Source: Ambulatory Visit | Attending: Internal Medicine | Admitting: Internal Medicine

## 2022-09-20 DIAGNOSIS — I700 Atherosclerosis of aorta: Secondary | ICD-10-CM | POA: Diagnosis not present

## 2022-09-20 DIAGNOSIS — J849 Interstitial pulmonary disease, unspecified: Secondary | ICD-10-CM | POA: Diagnosis not present

## 2022-09-20 DIAGNOSIS — J84112 Idiopathic pulmonary fibrosis: Secondary | ICD-10-CM

## 2022-09-20 DIAGNOSIS — I2510 Atherosclerotic heart disease of native coronary artery without angina pectoris: Secondary | ICD-10-CM | POA: Diagnosis not present

## 2022-09-21 NOTE — Progress Notes (Signed)
Very mild heart muscle stiffness that is c/w age. Otherwise normal echo. You will not be called about this reuslt

## 2022-09-27 DIAGNOSIS — M5417 Radiculopathy, lumbosacral region: Secondary | ICD-10-CM | POA: Diagnosis not present

## 2022-09-30 IMAGING — CT CT CHEST HIGH RESOLUTION W/O CM
2 of 8 series · 14 of 36 positions shown, 17 images · non-contrast
Comparison: High-resolution chest CT 11/05/2018.

CLINICAL DATA: 79-year-old female with history of pulmonary
fibrosis. Follow-up study.

EXAM:
CT CHEST WITHOUT CONTRAST
TECHNIQUE: Multidetector CT imaging of the chest was performed following the
standard protocol without intravenous contrast. High resolution
imaging of the lungs, as well as inspiratory and expiratory imaging,
was performed.

[Series 10: high resolution · axial · 0.62mm/px · z∈[-259,-19]mm · 11 of 289 slices shown, 14 images]
[im 25/289  mediastinal]
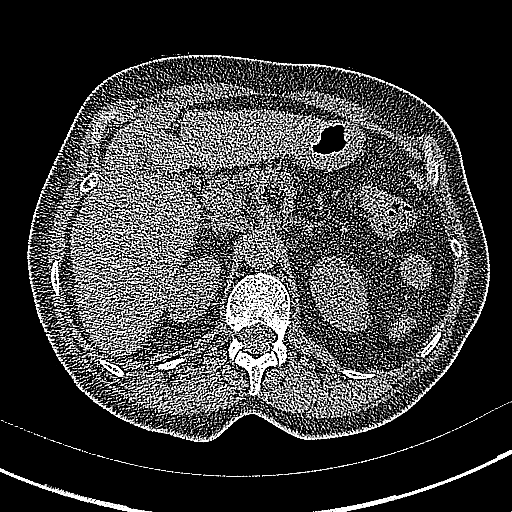
[im 25/289  lung]
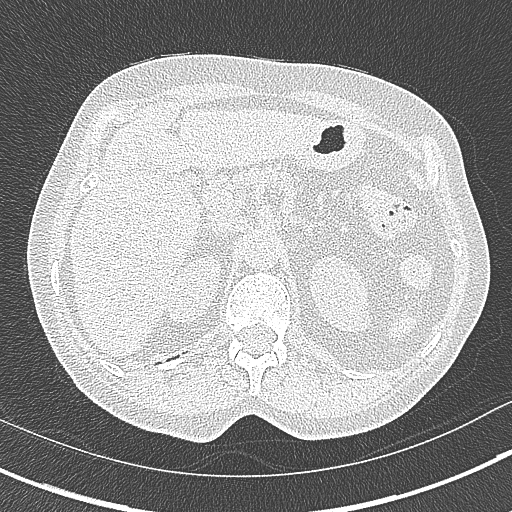
[im 49/289  lung]
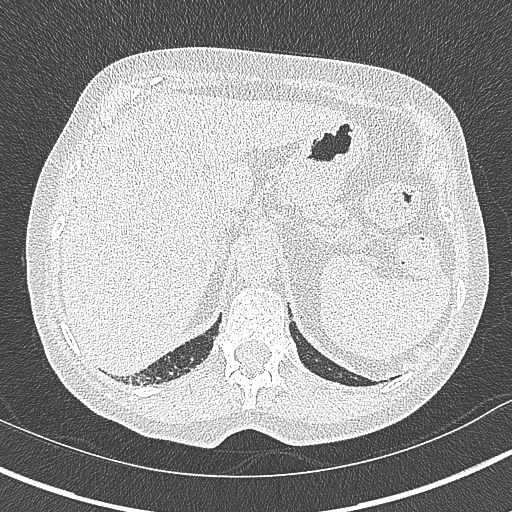
[im 73/289  lung]
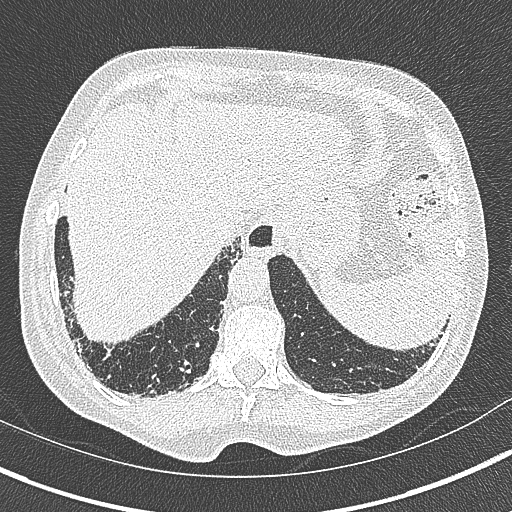
[im 97/289  lung]
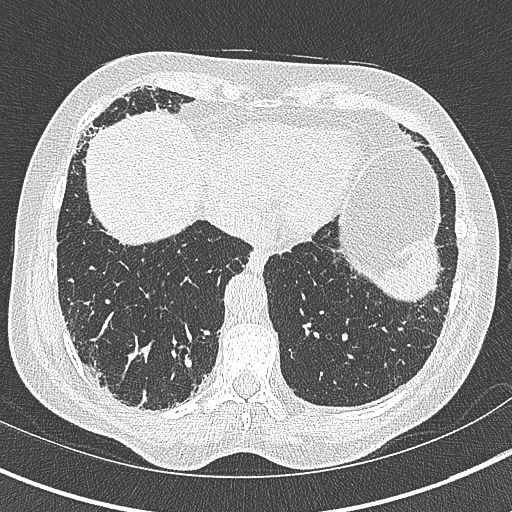
[im 121/289  mediastinal]
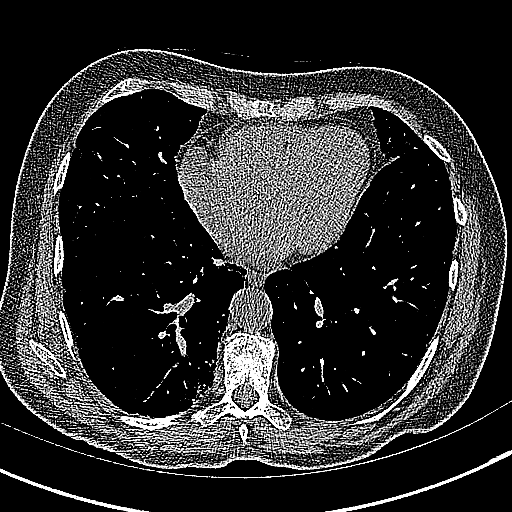
[im 121/289  lung]
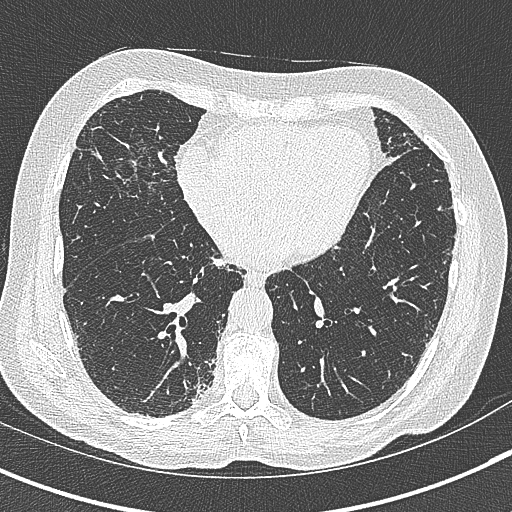
[im 145/289  lung]
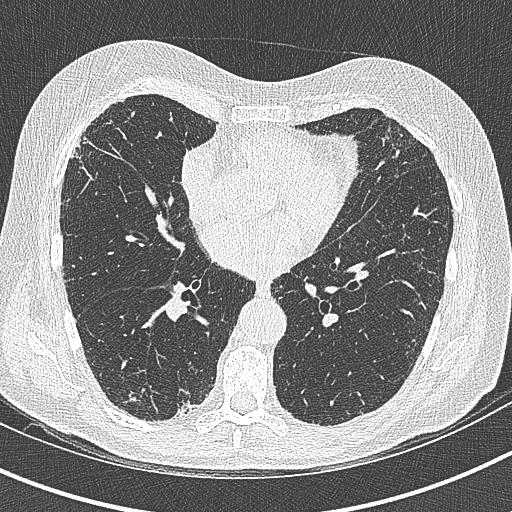
[im 169/289  lung]
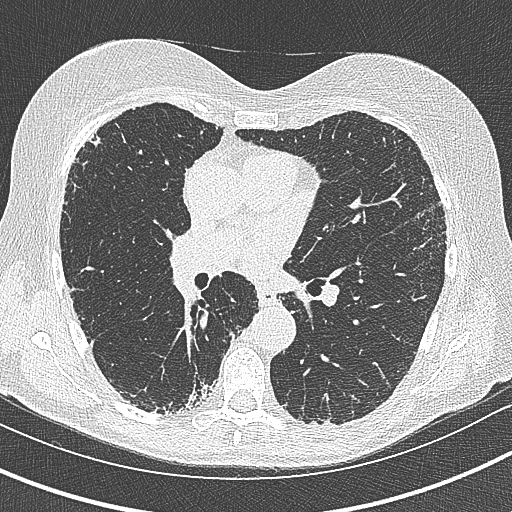
[im 193/289  lung]
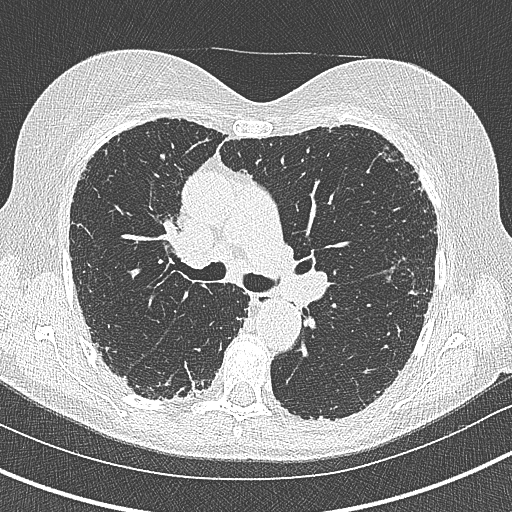
[im 217/289  mediastinal]
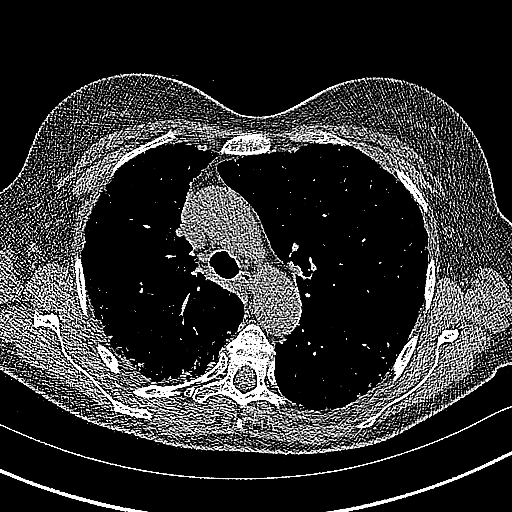
[im 217/289  lung]
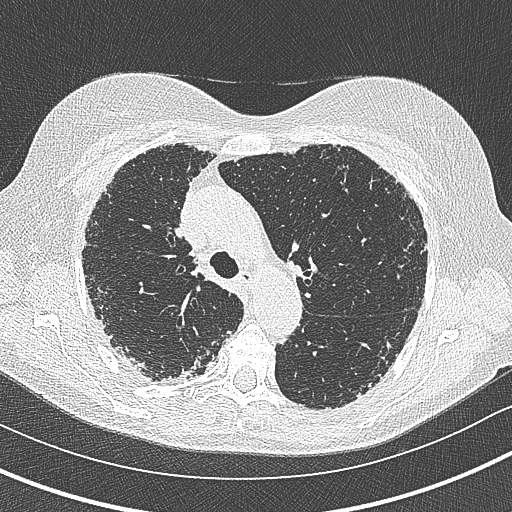
[im 241/289  lung]
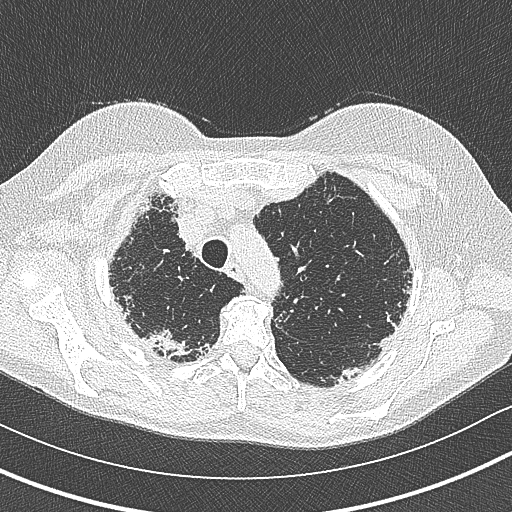
[im 265/289  lung]
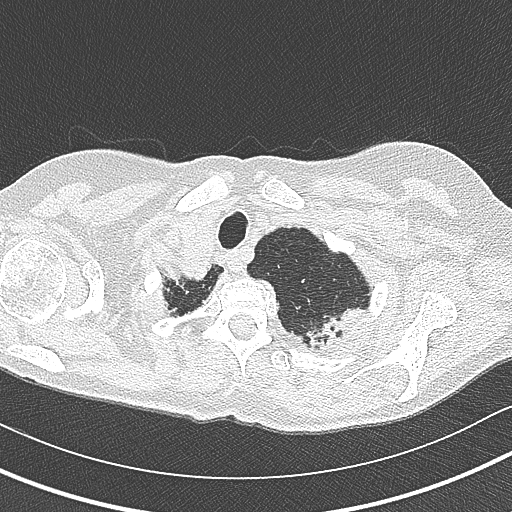

[Series 12: coronal · coronal · 0.59mm/px · 3 of 140 slices shown]
[im 28/140  lung]
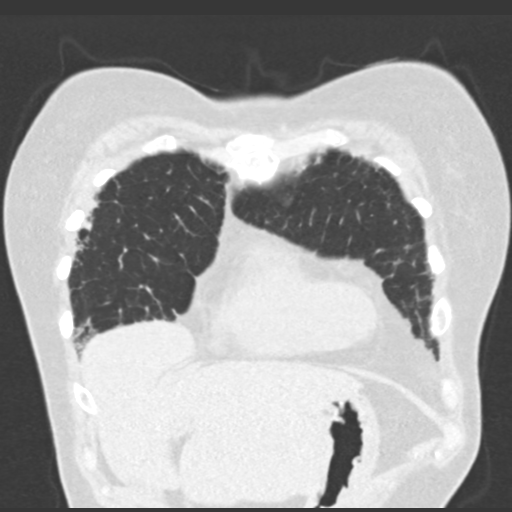
[im 56/140  lung]
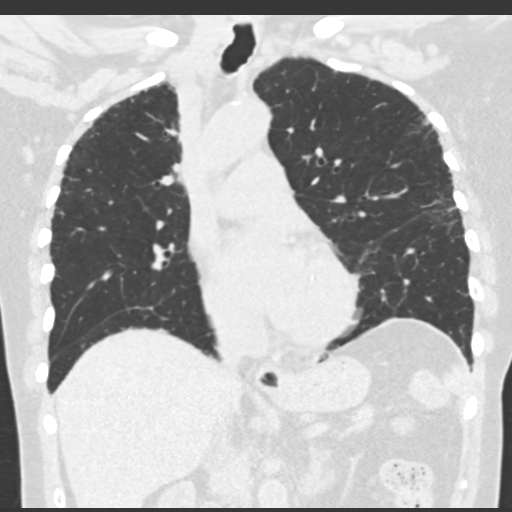
[im 84/140  lung]
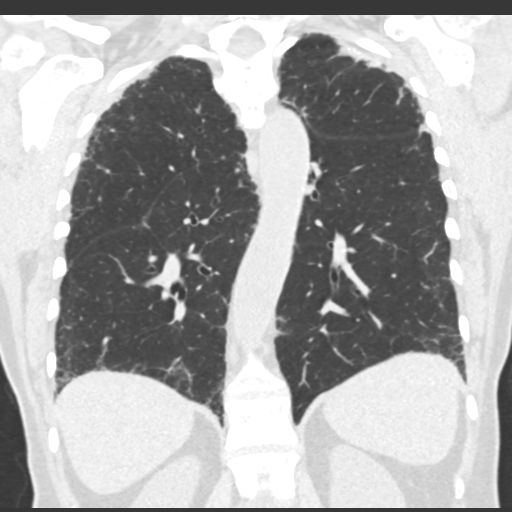

[14 of 36 positions shown; findings below may reference images not displayed]

FINDINGS: Cardiovascular: Heart size is normal. There is no significant
pericardial fluid, thickening or pericardial calcification. There is
aortic atherosclerosis, as well as atherosclerosis of the great
vessels of the mediastinum and the coronary arteries, including
calcified atherosclerotic plaque in the left main, left anterior
descending, left circumflex and right coronary arteries.

Mediastinum/Nodes: No pathologically enlarged mediastinal or hilar
lymph nodes. Please note that accurate exclusion of hilar adenopathy
is limited on noncontrast CT scans. Small hiatal hernia. No axillary
lymphadenopathy.

Lungs/Pleura: Once again high-resolution images demonstrate
scattered patchy areas of peripheral predominant septal thickening,
subpleural reticulation and mild cylindrical bronchiectasis with
peripheral bronchiolectasis. No frank honeycombing is confidently
identified. No significant progression of disease compared to the
prior examination. No definitive craniocaudal gradient. Inspiratory
and expiratory imaging demonstrates minimal air trapping indicative
of very mild small airways disease. No acute consolidative airspace
disease. No pleural effusions. No definite suspicious appearing
pulmonary nodules or masses are noted.

Upper Abdomen: Aortic atherosclerosis.

Musculoskeletal: Diffuse sclerosis throughout the visualized axial
and appendicular skeleton, similar to prior studies. There are no
aggressive appearing lytic or blastic lesions noted in the
visualized portions of the skeleton.
IMPRESSION: 1. The appearance of the lungs remains compatible with interstitial
lung disease, once again most compatible with an alternative
diagnosis (not usual interstitial pneumonia) per current ATS
guidelines. Findings remain most compatible with probable fibrotic
phase nonspecific interstitial pneumonia (NSIP).
2. Aortic atherosclerosis, in addition to left main and 3 vessel
coronary artery disease. Assessment for potential risk factor
modification, dietary therapy or pharmacologic therapy may be
warranted, if clinically indicated.
3. Persistent diffuse osseous sclerosis throughout the visualized
axial and appendicular skeleton, similar to prior examinations.
Clinical correlation for evidence of systemic disease such as
myelofibrosis is once again suggested.

Aortic Atherosclerosis ([id number]).

## 2022-10-12 ENCOUNTER — Ambulatory Visit
Admission: RE | Admit: 2022-10-12 | Discharge: 2022-10-12 | Disposition: A | Discharge status: Home / Self Care | Payer: Medicare Other | Source: Ambulatory Visit | Attending: Internal Medicine

## 2022-10-12 DIAGNOSIS — Z1231 Encounter for screening mammogram for malignant neoplasm of breast: Secondary | ICD-10-CM | POA: Diagnosis not present

## 2022-10-16 NOTE — Progress Notes (Signed)
Pulmonary fibrosis continues to be stable. Coronary artery calification +

## 2022-10-23 ENCOUNTER — Ambulatory Visit: Payer: Medicare Other | Admitting: Internal Medicine

## 2022-10-23 ENCOUNTER — Telehealth: Payer: Self-pay | Admitting: Internal Medicine

## 2022-10-23 ENCOUNTER — Encounter: Payer: Self-pay | Admitting: Internal Medicine

## 2022-10-23 VITALS — BP 131/78 | HR 60 | Ht 66.0 in | Wt 140.6 lb

## 2022-10-23 DIAGNOSIS — R002 Palpitations: Secondary | ICD-10-CM | POA: Diagnosis not present

## 2022-10-23 DIAGNOSIS — J84112 Idiopathic pulmonary fibrosis: Secondary | ICD-10-CM | POA: Diagnosis not present

## 2022-10-23 LAB — BRAIN NATRIURETIC PEPTIDE: Pro B Natriuretic peptide (BNP): 113 pg/mL — ABNORMAL HIGH (ref 0.0–100.0)

## 2022-10-23 NOTE — Progress Notes (Signed)
IOV 12/04/2017: Dr Delton Coombes: Ms. Giannattasio is a 82 year old never smoker with a history of allergic rhinitis, GERD, hypothyroidism, MVP.  She is been seen in our office in the past by Dr. Shelle Iron for restless leg syndrome-she still treated for this with Requip. On allegra, nexium prn.   She is referred today for dyspnea.  She reports that she has had an exercise routine where she walked w her husband 2 miles, exercises at the [facility], for several years. She has noticed more SOB, especially with mild hills. Sometimes now has to stop briefly to complete the walk. Occasional chest tightness, no wheeze. No overt CP. She reports daily cough, often in the am, sometimes clear mucous. She is on allegra. She had a walking stress test 09/2014 > reassuring ECG w exercise. Weight has been stable. Her last TSH was 06/25/17, 0.73.   Review of her notes shows hx possible GGI on prior CT 2014.   ROV 02/26/18 [name] --82 year old never smoker with a history of mitral valve prolapse whom I saw earlier this year for exertional dyspnea and chronic cough.  Pulmonary function testing consistent with restriction.  She has GERD and allergic rhinitis both of which could be contributing to her chronic cough.  I performed a high-resolution CT scan of the chest on 1/20 and reviewed today, this shows some patchy confluent subpleural reticular disease and groundglass attenuation with some minimal traction bronchiectasis and no frank honeycomb change.  This reflects a progression compared with 2014 and is an NSIP pattern.  Walking oximetry at her last visit did not show any exertional desaturation. She is still coughing, is using fexofenadine, takes nexium qod.   [place], had pesticide exposure.   ROV 08/26/2018 [name] --follow-up visit for 82 year old woman with a history of mitral valve prolapse, chronic cough, dyspnea with restrictive lung disease noted on pulmonary function testing.  High Res CT scan of the chest  shows some mild interstitial disease.  She was also having cough - better with flonase; still on nexium, allegra. Her exertional tolerance is improved, she has been exercising more. She remains on Requip for RLS.   We performed autoimmune labs last time, ANA positive at low titer (1: 80), SSA and SSB negative, RNP negative, RF negative, CCP negative, Anti-Smith negative, anti-SCL negative, DS DNA negative, anti-Jo negative, aldolase negative. She hasn't been seen in ILD clinic yet.    OV 10/08/2018  Subjective:  Patient ID: Elizabeth Espinoza, female , DOB: 1940-06-20 , age 25 y.o. , MRN: [id number] , ADDRESS: [address] [place] [address]   10/08/2018 -   Chief Complaint  Patient presents with   Interstitial Lung Disease    Breathing the same as it was during August 2020 office visit with Dr. Delton Coombes     HPI Elizabeth Espinoza 82 y.o. -has been referred to the interstitial lung disease clinic because of findings of interstitial lung disease.  History is gathered from talking to her, review of Dr. Leslye Peer notes and also the integrated ILD questionnaire.  Briefly, she tells me that she was working out at [employer] and would just notice occasional dyspnea but she really did not compare it with other people.  Then in October 2019 she started walking 3 miles daily except on the days it rains with a husband.  During this time she noticed that she was falling really behind because of shortness of breath and exertional fatigue.  This resulted  in subsequent evaluation all documented above.  Findings of interstitial lung disease with subpleural reticulation suggestive of an alternative diagnosis.  Autoimmune profile essentially negative other than trace positive ANA.  She tells me that her significant major problem is exertional dyspnea when walking stairs or walking several miles.  She did not desaturate in our office several months ago.  She does not know if she desaturates when she exerts  walking 3 miles.  Pulmonary function test earlier this year is just isolated low DLCO.  She also has like a cough.  Overall since the onset of the symptoms by exercising herself more and conditioning she is somewhat better.  She did see Dr. [name] in May 2020 and in June 2020 had a coronary calcium CT which appears to have no calcium deposits.   [place] Integrated Comprehensive ILD Questionnaire  Symptoms:  -Dyspnea started suddenly and since it started it is better.  She says it is been present for years although she did tell me that it is only there since October 2019 when she noticed that.  Severity is listed below.  She does have a cough almost 1 year.  Since it started it is better.  It is mostly in the morning.  She does bring up some phlegm.  Early on in the morning it is green or yellow.  Since it started it is the same/better.  There is no wheezing.  She does have some chest tightness with this when she walks.  It is relieved by rest.  Cardiac work-up in June 2020 showed no calcium deposits.   Past Medical History : Positive for chronic longstanding acid reflux disease and thyroid disease..  In addition CT scan from January 2020 shows hiatal hernia that is small..  This presence of  sclerosis in the bony structures in January 2020.-Primary care physician has been sent a message today.  There is no asthma or COPD or heart failure rheumatoid arthritis or collagen vascular disease.  She does have GERD and hiatal hernia for several years to decades.  No sleep apnea.  No blood clots.  No hepatitis.  No tuberculosis.  No pleurisy.  , ANA positive at low titer (1: 80), SSA and SSB negative, RNP negative, RF negative, CCP negative, Anti-Smith negative, anti-SCL negative, DS DNA negative, anti-Jo negative, aldolase negative. She hasn't been seen in ILD clinic yet.    ROS:  -She does have fatigue for the last several years.  She does have some back and hip issues.  She does have dry eyes.  She  does have like some dysphagia.  There is presence of hiatal hernia-there is a small.  Acid reflux for several decades.  She also reports presence of nonspecific rash   FAMILY HISTORY of LUNG DISEASE: * -Her father died of mesothelioma in 61 83/1984 at the age of 25 otherwise no lung disease.   EXPOSURE HISTORY:   -When she was 17 she smokes cigarettes but otherwise no cigarette or tobacco use or electronic cigarette.  Never smoked marijuana.  No cocaine use no intravenous drug use.   HOME and HOBBY DETAILS :  -Single[place]family home [place] setting for the last 16 years in a 81 year old home.  No mold or mildew exposure in the [facility] duct or CPAP mask or humidifier.  No mold or mildew in the bathroom.  No pet birds in the house.  No misting [place].  No feather pillows no feather duvet.  No musical instruments.  She does some occasional gardening which  she likes.  She does do some fine-needle work.   OCCUPATIONAL HISTORY (122 questions) :  = Essentially negative except for the fact when she was a child she did some tobacco growing.  She has done home gardening for 50 years.    PULMONARY TOXICITY HISTORY (27 items):  denies  Results for SHAHRZAD, KOBLE (MRN [id number]) as of 11/07/2018 11:20  Ref. Range 02/26/2018 12:17  Anti Nuclear Antibody (ANA) Latest Ref Range: NEGATIVE  POSITIVE (A)  ANA Pattern 1 Unknown Nuclear, Speckled (A)  ANA Titer 1 Latest Units: titer 1:80 (H)  Anti JO-1 Latest Ref Range: 0.0 - 0.9 AI <0.2  Cyclic Citrullin Peptide Ab Latest Units: UNITS <16  ds DNA Ab Latest Units: IU/mL <1  RA Latex Turbid. Latest Ref Range: <14 IU/mL <14  ENA SM Ab Ser-aCnc Latest Ref Range: <1.0 NEG AI <1.0 NEG  Ribonucleic Protein(ENA) Antibody, IgG Latest Ref Range: <1.0 NEG AI <1.0 NEG  SSA (Ro) (ENA) Antibody, IgG Latest Ref Range: <1.0 NEG AI <1.0 NEG  SSB (La) (ENA) Antibody, IgG Latest Ref Range: <1.0 NEG AI <1.0 NEG  Scleroderma (Scl-70) (ENA) Antibody, IgG Latest Ref Range: <1.0  NEG AI <1.0 NEG   Results for TONIETTE, DEVERA (MRN [id number]) as of 11/07/2018 11:20  Ref. Range 01/29/2018 14:31  FVC-Pre Latest Units: L 2.97  FVC-%Pred-Pre Latest Units: % 98  Results for KALEEN, ROCHETTE (MRN [id number]) as of 11/07/2018 11:20  Ref. Range 01/29/2018 14:31  DLCO unc Latest Units: ml/min/mmHg 14.80  DLCO unc % pred Latest Units: % 54    OV 11/07/2018  Subjective:  Patient ID: Elizabeth Espinoza, female , DOB: 1940/01/23 , age 69 y.o. , MRN: [id number] , ADDRESS: [address] [place] [address]   11/07/2018 -   Chief Complaint  Patient presents with   Follow-up    Patient reports that she has sob with any exertion.    Follow-up interstitial lung disease  HPI ALASIA ENGE 82 y.o. -last seen September 2020.  After that she was supposed to have follow-up high-resolution CT chest and spirometry DLCO.  For some reason the spirometry DLCO is not done.  In the interim her symptoms remain the same as shown by the symptom score below.  She does note when she does heavy exertion such as climbing stairs or long uphill walks her pulse ox drops to 84% but quickly regains.  She is not interested in portable oxygen.  She had a repeat high-resolution CT chest in September 2020 and when compared to January 2020 there is no significant change.  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  I personally visualized the film.  There is diffuse bilateral subpleural reticulation and some traction bronchiectasis.  I myself would say it may be this is indeterminate for UIP.  But there is some minimal air trapping and she is done some gardening work.  There is no upper zonal predominance that would fit in with hypersensitive pneumonitis.  She has incidental findings of bony sclerosis that is again repeated in the CT scan.  Her primary care physician Cheryll Cockayne evaluated her.  I reviewed the note.  She is been  referred to Dr. Jeanie Sewer in hematology who she is seeing November 12, 2018.  She is very confused about the fact that she has a bony sclerosis problem for which she is being referred to a blood doctor [hematologist] and she thought she is  having a blood test that was ordered by me.  I reviewed the chart and clarified this concepts   Ct Chest High Resolution  Result Date: 11/05/2018 CLINICAL DATA:  82 year old female with history of interstitial lung disease. Increased shortness of breath and cough over the past year. EXAM: CT CHEST WITHOUT CONTRAST TECHNIQUE: Multidetector CT imaging of the chest was performed following the standard protocol without intravenous contrast. High resolution imaging of the lungs, as well as inspiratory and expiratory imaging, was performed. COMPARISON:  High-resolution chest CT 02/10/2018. FINDINGS: Cardiovascular: Heart size is normal. There is no significant pericardial fluid, thickening or pericardial calcification. There is aortic atherosclerosis, as well as atherosclerosis of the great vessels of the mediastinum and the coronary arteries, including calcified atherosclerotic plaque in the left anterior descending and right coronary arteries. Mild calcifications of the mitral annulus. Mediastinum/Nodes: No pathologically enlarged mediastinal or hilar lymph nodes. Please note that accurate exclusion of hilar adenopathy is limited on noncontrast CT scans. Esophagus is unremarkable in appearance. No axillary lymphadenopathy. Lungs/Pleura: High-resolution images demonstrates some patchy areas of peripheral predominant septal thickening and subpleural reticulation, with mild cylindrical bronchiectasis and peripheral bronchiolectasis. No frank honeycombing confidently identified at this time. These findings have no definitive craniocaudal gradient. In the periphery of the mid to upper lungs there also some plaque-like areas of apparent pleuroparenchymal scarring and volume loss.  Inspiratory and expiratory imaging demonstrates minimal air trapping indicative of very mild small airways disease. Overall, these imaging findings appear stable compared to the prior study. Upper Abdomen: Aortic atherosclerosis. Musculoskeletal: Mild diffuse sclerosis throughout the visualized axial and appendicular skeleton, similar to prior examinations. There are no definite focal aggressive appearing lytic or blastic lesions noted in the visualized portions of the skeleton. IMPRESSION: 1. 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 2. Aortic atherosclerosis, in addition to 2 vessel coronary artery disease. Assessment for potential risk factor modification, dietary therapy or pharmacologic therapy may be warranted, if clinically indicated. 3. Persistent mild diffuse sclerosis throughout the visualized osseous structures without discrete aggressive appearing osseous lesions. Clinical correlation for signs and symptoms of potential infiltrative process such as myelofibrosis is suggested. Aortic Atherosclerosis (ICD10-I70.0). Electronically Signed   By: Trudie Reed M.D.   On: 11/05/2018 14:36     OV 12/22/2018  Subjective:  Patient ID: Elizabeth Espinoza, female , DOB: [birth year] , age 4 y.o. , MRN: [id number] , ADDRESS: [address] [place] [address]   12/22/2018 -   Chief Complaint  Patient presents with   Follow-up    Pt states she has been doing good since last visit. Pt is still coughing and will get up clear mucus in the morning.   Follow-up interstitial lung disease with CT scan October 2020 being indeterminate versus alternate diagnosis.  History of gardening.  Trace  autoimmune ANA positive.  Mild progression since 2014  HPI DIAMONDS LIPPARD 82 y.o. -returns for follow-up.  She presents with her husband who I am meeting for the first time.  In the interim she met Dr. Dorris Fetch thoracic surgeon for surgical lung biopsy.  Her husband also met with him.  I reviewed the note.  She tells me that given the morbidity with surgical lung biopsy she wants to  undergo bronchoscopy with lavage and transbronchial biopsy first.  She understands the inherent limitations of these procedure in terms of diagnosis.  But she wants to take the lower risk profile.  Overall she feels stable.  Her symptom score is improved compared to the past.  Her walking desaturation test shows exaggerated drop in pulse ox but this was done with her wearing the mask.  She did not feel any dyspnea.  In terms of her bony sclerosis she has seen Dr. Leonides Schanz at [facility].  I reviewed the note.  He has reassured her.  Risks of pneumothorax, hemothorax, sedation/anesthesia complications such as cardiac or respiratory arrest or hypotension, stroke and bleeding all explained. Benefits of diagnosis but limitations of non-diagnosis also explained. Patient verbalized understanding and wished to proceed.   They want to have the bronchoscopy after the holidays of Christmas and new year.  This is because their house is undergoing remodeling currently.       OV 03/16/2019  Subjective:  Patient ID: Elizabeth Espinoza, female , DOB: 07-04-1940 , age 80 y.o. , MRN: [id number] , ADDRESS: [address] [place] [address]   03/16/2019 -   Chief Complaint  Patient presents with   Follow-up    PFT performed today.  Pt states she has been doing okay since last visit and states her breathing is about the same.     Finally able to review esults of envisia send out test to [place]. Date of test is 02/05/2019. 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 -returns for follow-up to discuss  bronchoscopy and lavage results.  In the interim no new respiratory issues.  They are all stable.  However she is having significant musculoskeletal issues with shoulder pain.  Serology from a year ago was negative.  They wanted to know about relatedness.  I told him it is probably not related.  We went over the bronchoscopy lavage results which showed some dominance of neutrophils consistent with UIP.  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   We had a long discussion about the benefits, risks and limitations of antifibrotic therapy.  We also discussed the choice between pirfenidone and nintedanib.       Results for MARNEY, TRELOAR (MRN [id number]) as of 03/16/2019 11:12  Ref. Range 01/29/2018 14:31 03/16/2019 08:42  FVC-Pre Latest Units: L 2.97 2.91  FVC-%Pred-Pre Latest Units: % 98 97   Results for VIDHI, DELELLIS (MRN [id number]) as of 03/16/2019 11:12  Ref. Range 01/29/2018 14:31 03/16/2019 08:42  DLCO unc Latest Units: ml/min/mmHg 14.80 12.32  DLCO unc % pred Latest Units: % 54 60     OV 06/11/2019 - telephine visit. Patient identified with 2 pHI, risks, benefit, limitations of tele visit explained  Subjective:  Patient ID: Elizabeth Espinoza, female , DOB: Sep 24, 1940 , age 5 y.o. , MRN: [id number] , ADDRESS: [address] [place] [address] ate of test is 02/05/2019. 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 -in this telephone visit patient is now on pirfenidone.  She says she is on pirfenidone since mid April 2021.  She is on 3 pills 3 times a day.  She spacing them  4 hours apart but having some intermittent nausea every few days.  She did have an urticaria but that was before she started pirfenidone was related to Covid vaccine  that is now resolved.  She is asking about exercising at the [facility] and Covid precautions.  I advised her because she is vaccinated that the risk is low but not 0 and to take adequate precautions as tolerated.  She had liver function test and this is normal.  Her next appointment for pulmonary function test is in mid July 2021.  Recommended she make a face-to-face visit with me at that time.   OV 08/21/2019   Subjective:  Patient ID: Elizabeth Espinoza, female , DOB: 1940/05/20, age 89 y.o. years. , MRN: [id number],  ADDRESS: [address] [place] [address] PCP  Pincus Sanes, MD Providers : Treatment Team:  Attending Provider: Kalman Shan, MD  Type of visit: Telephone Circumstance: COVID-19 national emergency Identification of patient Elizabeth Espinoza - 2 person identifier Risks: Risks, benefits, limitations of telephone visit explained Patient location: home This provider location: [facility]      Chief Complaint  Patient presents with   Follow-up    PFT 7/27--c/o sob with stairs and throat clearing mainly in the morning. stopped Esbriet on 08/12/2019.   Follow-up interstitial lung disease with CT scan October 2020 being indeterminate versus alternate diagnosis.  History of gardening.  Trace autoimmune ANA positive.  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 -presents for this telephone visit for IPF.  On this telephone visit she was identified with 2 person identifier.  Risks, benefits and limitations of telephone visit explained.  After last visit her GI symptoms worsen.  She did see Dr. Camillia Herter gastroenterologist.  She underwent endoscopy.  Dr. Loreta Ave did send the results to me I do not have this.  I remember Dr. Loreta Ave calling me and discussing  patient's GI side effects.  These were from pirfenidone so on August 12, 2019 based on my advise she start pirfenidone.  Since then her symptoms of GI nature have resolved.  Her respiratory symptoms continue to be stable.  These are all documented below.  At this point in time she feels that her quality of life is very important.  She does not want to go through the kind of GI side effect she went through with pirfenidone.  She would rather deal with the disease.  She is aware that nintedanib is a standard of care therapeutic option.  She is also aware after discussion the clinical trials as a care option particularly in the past to discovering new therapies that do not have this GI side effects that pirfenidone poses.  She had a lot of questions about Covid vaccine.  She is fully vaccinated.  She is worried about her grandson was refusing the vaccine.      OV 09/29/2019   Subjective:  Patient ID: Elizabeth Espinoza, female , DOB: 1940/09/06, age 20 y.o. years. , MRN: [id number],  ADDRESS: [address] [place] [address] PCP  Pincus Sanes, MD Providers : Treatment Team:  Attending Provider: Kalman Shan, MD   Chief Complaint  Patient presents with   Follow-up    LFT today?  pulmonary rehab or exercises.  inhaler trial, felt better after Advair.   Follow-up interstitial lung disease/IPF with CT scan October 2020 being indeterminate versus alternate diagnosis.  History of gardening.  Trace autoimmune ANA positive.  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 Date of test is 02/05/2019. 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  -Date of giving diagnosis is March 16, 2019  - MDD 05/05/19  - esbriet since 05/06/19- stopped 08/12/19 due to severe GI side effect    HPI AQUARIUS TREMPER 81 y.o. -    presesnts  With her husband for IPF followup.  Overall she is doing  well.  She is not taking pirfenidone since July 2021.  After that she has gained weight.  All her GI symptoms have resolved.  She goes for daily walks.  She says when she reaches the top of the hill sometimes she desaturates to 88% but not always.  She monitors this closely.  She is asking for other exercises that can improve her endurance and lung function.  We discussed that exercises do not improve lung function.  She is willing to participate in pulmonary rehabilitation.  We discussed the alternative of taking nintedanib but because of the severe GI side effects that she had with Esbriet she is nervous about starting nintedanib and does not want to.  I introduced her to the concept of clinical trials as a care option.  At this point in time she wants to process this information.  We did discuss the fact that IPF is a progressive disease and antifibrotic's are indicated early in the course of the disease.  She prefers to have supportive care approach.  She wants to focus on strengthening her overall physical shape.   OV 06/30/2021 -standard of care visit in this research patient.  Subjective:  Patient ID: Elizabeth Espinoza, female , DOB: 1940/10/03 , age 37 y.o. , MRN: [id number] , ADDRESS: [address] [place] [address] PCP Pincus Sanes, MD Patient Care Team: Pincus Sanes, MD as PCP - General (Internal Medicine) Jens Som Madolyn Frieze, MD as Consulting Physician (Cardiology) Leslye Peer, MD as Consulting Physician (Pulmonary Disease) Kathyrn Sheriff, [facility] as Pharmacist (Pharmacist) Christia Reading, MD as Consulting Physician (Otolaryngology) [facility] as Consulting Physician (Ophthalmology)  This Provider for this visit: Treatment Team:  Attending Provider: Kalman Shan, MD    06/30/2021 -   Chief Complaint  Patient presents with   Follow-up    Pt recently had a CT of her sinuses and is here to discuss the results.  Pt states she has not been  feeling well and states she is still having problems with her sinuses. States her breathing has been doing okay.     HPI CHERYLENE FERRUFINO 82 y.o. -on April 19, 2021 she did have some side effects from inhaled medical product.  After that we reduce the dose and slowly escalated back and then she was tolerating it fine.  On 05/22/2021 she was prescribed antibiotics for some sinus issues she did have week 8 study visit on 06/01/2021 and was doing okay.  But then on 06/20/2021 she started complaining of headache and sinus congestion and coughing and blowing yellowish mucus.  At this point in time she was taking inhaled treprostinil versus placebo investigational medical product at 4 times daily each time 12 puffs.  We told  her to reduce it to 9 puffs 4 times daily.  She tells me that perhaps she is marginally better after reducing the dose but she still having headaches.  She feels like her head is full particularly in the top part.  She also has significant cough.  She says during and after she takes the inhaled medical investigational product her cough score is 9 out of 10.  Rest of the day it is around 3 out of 10.  She coughs so much that she sometimes brings out mild yellow mucus that is like glue.  There is no diarrhea fatigue or weight loss or syncope or hemoptysis.  Definitely all the symptoms only after starting the research protocol.  She also some sore throat.  She has tried some remedial measures but these have not helped.  These include yogurt and ginger ale  For the dyspnea itself she feels stable.  She also tells me that she is able to walk a mile.  She is able to outplace her husband.  She feels her husband's declining health.  She says when she walks a mile she puts a finger probe after she rests and the pulse ox is 94%.  She is worried she might desaturate below 88%.  She does not have a forehead pulse oximeter.  She says the finger pulse ox meter does not stay on while she is walking.  I gave her a  contact of the patient support group leader to figure out a way to get a forehead probe.  Also showed her various finger proximal she can get that can stay on her finger while she exercises.  Told her the safety limit is greater than 88%.  Symptom scores below show that her symptoms are better than 2 years ago slightly.  This is from a respiratory standpoint with a cough is definitely worse after she went on the study.  And the headache is new.  She had a CT scan sinus that shows chronic findings.      OV 05/04/2022  Subjective:  Patient ID: Elizabeth Espinoza, female , DOB: Dec 22, 1940 , age 57 y.o. , MRN: [id number] , ADDRESS: [address] [place] [address] PCP Pincus Sanes, MD Patient Care Team: Pincus Sanes, MD as PCP - General (Internal Medicine) Jens Som Madolyn Frieze, MD as Consulting Physician (Cardiology) Leslye Peer, MD as Consulting Physician (Pulmonary Disease) Kathyrn Sheriff, [facility] as Pharmacist (Pharmacist) Christia Reading, MD as Consulting Physician (Otolaryngology) [facility] as Consulting Physician (Ophthalmology)  This Provider for this visit: Treatment Team:  Attending Provider: Kalman Shan, MD  05/04/2022 -   Chief Complaint  Patient presents with   Follow-up    F/u after researched      HPI Elizabeth Hey Hehir 82 y.o. -returns for routine follow-up.  In the interim she was on inhaled treprostinil versus placebo but she did not tolerate it at all.  She had all the classic side effects of the renal drug.  We made multiple attempts in holiday and giving the drug back but she did not tolerate it at all and she finally quit.  She is out of the study now.  She did do 1 year follow-up and exited the study.  Currently she is on supportive care.  She walks on Monday through Friday 3 miles a day.  This is through [facility] and she has gotten the best walker award.  She does have some chest burning when she starts the  walking but it  does get better as she walks further.  She is not limited by dyspnea.  She checks her pulse ox after the walk a few seconds later and the pulse ox is 93% 94% at the lowest.  I did tell her to monitor it while she walks.  In terms with therapy she is not interested in standard of care antifibrotic therapy because of side effect profile.  She tried pirfenidone in the past.  She is also been intolerant to treprostinil.  She is absolutely not interested in nintedanib.  We talked about doing the revert study sponsored by [facility].  Is an oral pill.  She read the consent form which we gave it to her some weeks ago.  She does not want to do the study because of side effect profile.  There is another study called MOONSCAPE by [place].  Is an injection study.  Current patients are tolerating the study well so far.  She might be interested in this..  Will probably aim to recruit and Q3  2024.    OV 09/17/2022  Subjective:  Patient ID: Elizabeth Espinoza, female , DOB: 1940-05-09 , age 5 y.o. , MRN: [id number] , ADDRESS: [address] [place] [address] PCP Pincus Sanes, MD Patient Care Team: Pincus Sanes, MD as PCP - General (Internal Medicine) Jens Som Madolyn Frieze, MD as PCP - Cardiology (Cardiology) Jens Som Madolyn Frieze, MD as Consulting Physician (Cardiology) Leslye Peer, MD as Consulting Physician (Pulmonary Disease) Kathyrn Sheriff, [facility] (Inactive) as Pharmacist (Pharmacist) Christia Reading, MD as Consulting Physician (Otolaryngology) [facility] as Consulting Physician (Ophthalmology)  This Provider for this visit: Treatment Team:  Attending Provider: Kalman Shan, MD    09/17/2022 -   Chief Complaint  Patient presents with   Follow-up    Breathing is about the same. She denies any new co's. She is walking 3 miles daily.    HPI LAUREEN FREDERIC 82 y.o. -presents for follow-up.  She is currently on supportive care.  She started off the visit by saying  she was doing stable but later she did admit that when she does a 3 mile walk sometimes she gets dizziness but she is not checking her pulse ox but she is still able to finish her 3 miles at the same time.  She also when she does vacuuming she gets quite dyspneic.  No new medical problems other than the fact her leg and feet have chronic numbness and associated with restless legs. Interim Health status: No new complaints No new medical problems. No new surgeries. No ER visits. No Urgent care visits. No changes to medications.  Symptom scores and x-rays hypoxemia test remained stable but a pulmonary function test today shows a drop in DLCO.  We discussed this.  In terms of treatment she is only on supportive care after having failed antifibrotic's and previous intolerance to inhaled treprostinil study drug.  She is looking at the injection based therapy via [employer] protocol.       OV 10/23/2022  Subjective:  Patient ID: Elizabeth Espinoza, female , DOB: [birth year] , age 35 y.o. , MRN: [id number] , ADDRESS: [address] [place] [address] PCP Pincus Sanes, MD Patient Care Team: Pincus Sanes, MD as PCP - General (Internal Medicine) Jens Som Madolyn Frieze, MD as PCP - Cardiology (Cardiology) Jens Som Madolyn Frieze, MD as Consulting Physician (Cardiology) Leslye Peer, MD as Consulting Physician (Pulmonary Disease) Kathyrn Sheriff, [facility] (  Inactive) as Pharmacist (Pharmacist) Christia Reading, MD as Consulting Physician (Otolaryngology) [facility] as Consulting Physician (Ophthalmology)  This Provider for this visit: Treatment Team:  Attending Provider: Kalman Shan, MD    10/23/2022 -   Chief Complaint  Patient presents with   Follow-up    Ct scan f/u       Follow-up interstitial lung disease/IPF with CT scan October 2020 being indeterminate versus alternate diagnosis.  History of gardening.  Trace autoimmune ANA positive.  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 Date of test is 02/05/2019. 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  HPI AVAYAH RAFFETY 82 y.o. -returns for follow-up.  She continues a 3 mile walks.  She feels that the exertional shortness of breath that she was having during the 3 mile walk is somewhat better.  Reviewing of the symptom score tells me that she has been stable overall and it is hard to discern.  The big concern at last visit was a drop in pulmonary function test especially DLCO.  There was concern about pulmonary hypertension versus pulmonary fibrosis worsening so so we did BNP and this was elevated increasing concern for pulmonary hypertension but the echo shows only grade 1 diastolic dysfunction normal RV.  Were also concerned about worsening pulmonary fibrosis we did a high-resolution CT chest and radiology feels it has been stable for 2 years.  Stability in lung function is confirmed by stability and symptom score and also exercise hypoxemia test.  The big change she has there is a pulmonary function test.  This could be a red herring based on variations.  Nevertheless the BNP was high.  Therefore we will recheck this today.  She did mention to me that 7 or 10 days ago she did have an episode of palpitations while doing a 3 mile walk heart rate was 170 on the Fitbit and she had to stop.  No known atrial fibrillation diagnosis.  Same thing happened  2 months ago but she forgot to mention this to Dr. Jens Som on her visit early August 2024.  I have reached out to him via electronic message.  In terms of her pulmonary fibrosis therapy she is interested in other therapies that have minimal side effect profile.  The approved and antifibrotic's all have side effect profile.  She is interested experimental protocol.  We went over VIXARELIMAB monoclonal antibody given every few weeks.  There are some interesting data with prurigo.  I showed her that in the side effect profile.  She is taken the consent form.  She is interested in this at this point.      SYMPTOM SCALE - ILD     Inhaled Tyvaso v Placebo. 05/04/2022 Supportive care  09/17/2022 Supporitve care 10/23/2022 Supportive care  O2 use 10/08/2018  11/07/2018  12/22/2018  06/30/21     Shortness of Breath 0 -> 5 scale with  5 being worst (score 6 If unable to do)  ra ra ra ra  At rest 0 0 0 0 0 0  Simple tasks - showers, clothes change, eating, shaving 1 1 0 1 1 1.5  Household (dishes, doing bed, laundry) 2 2 2 3 1  for mos stuff to 5 with vaccuming (3) 1.5 (4 vaccuum)  Shopping 2 2 0.5 2 2 2   Walking level at own pace 2 1 0.5 1 1 1   Walking up Stairs 4 5 3  2.5 3.5 2.5  Total (40 - 48) Dyspnea Score 11 11 6  9.5 9.5 8.5  How bad is your cough? 1 3 2.5 5 3 2   How bad is your fatigue 2 2.5 1.5 am, 4.5 pm 4 2 2.5  nausea    1 0 0  vomit    0 00 0  diarrhea    0 0 0  anxiet    00 0 0  depression    0 0 0  pain    Headacne,, sinus complaints  0   Simple office walk 185 feet x  3 laps goal with forehead probe 06/30/2021  09/17/2022  10/23/2022   O2 used ra ra ra  Number laps completed 3 Sit stand x 10 Sit stand x 15  Comments about pace Fast pace  moderate  Resting Pulse Ox/HR 96% and 74/min 96% and HR 62 97% and HR 75     98% and HR 89  Final Pulse Ox/HR 96% and 117/min 94% and HR 77 94% and HR 73  Desaturated </= 88% no no no  Desaturated <= 3% points n no yes  Got Tachycardic >/=  90/min yes no no  Symptoms at end of test No complaints No compalints No complaints  Miscellaneous comments Sx        CT Chest data from date: 09/20/22  Narrative & Impression  CLINICAL DATA:  82 year old female with history of interstitial lung disease. Follow-up study.   EXAM: CT CHEST WITHOUT CONTRAST   TECHNIQUE: Multidetector CT imaging of the chest was performed following the standard protocol without intravenous contrast. High resolution imaging of the lungs, as well as inspiratory and expiratory imaging, was performed.   RADIATION DOSE REDUCTION: This exam was performed according to the departmental dose-optimization program which includes automated exposure control, adjustment of the mA and/or kV according to patient size and/or use of iterative reconstruction technique.   COMPARISON:  High-resolution chest CT 11/14/2020.   FINDINGS: Cardiovascular: Heart size is normal. There is no significant pericardial fluid, thickening or pericardial calcification. There is aortic atherosclerosis, as well as atherosclerosis of the great vessels of the mediastinum and the coronary arteries, including calcified atherosclerotic plaque in the left anterior descending and right coronary arteries. Mild calcification of the mitral annulus.   Mediastinum/Nodes: No pathologically enlarged mediastinal or hilar lymph nodes. Please note that accurate exclusion of hilar adenopathy is limited on noncontrast CT scans. Esophagus is unremarkable in appearance. No axillary lymphadenopathy.   Lungs/Pleura: Widespread areas of peripheral predominant septal thickening, subpleural reticulation, scattered mild cylindrical bronchiectasis and peripheral bronchiolectasis are again noted throughout the lungs bilaterally. No definite honeycombing noted. No discernible craniocaudal gradient. Overall, these findings appear essentially unchanged compared to the prior study. Inspiratory and expiratory  imaging is unremarkable. No acute consolidative airspace disease. No definite suspicious appearing pulmonary nodules or masses are noted. Extensive bilateral apical pleuroparenchymal thickening and architectural distortion, similar to the prior study.   Upper Abdomen: Aortic atherosclerosis.   Musculoskeletal: There  are no aggressive appearing lytic or blastic lesions noted in the visualized portions of the skeleton.   IMPRESSION: 1. 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 Given the long-term stability, an alternative diagnosis such as fibrotic phase nonspecific interstitial pneumonia (NSIP) should also be considered. 2. Aortic atherosclerosis, in addition to left anterior descending and right coronary artery disease. Please note that although the presence of coronary artery calcium documents the presence of coronary artery disease, the severity of this disease and any potential stenosis cannot be assessed on this non-gated CT examination. Assessment for potential risk factor modification, dietary therapy or pharmacologic therapy may be warranted, if clinically indicated.   Aortic Atherosclerosis (ICD10-I70.0).     Electronically Signed   By: Trudie Reed M.D.   On: 09/29/2022 12:00      Latest Reference Range & Units 09/17/22 12:05  Pro B Natriuretic peptide (BNP) 0.0 - 100.0 pg/mL 129.0 (H)  (H): Data is abnormally high   ECHO 09/18/22   IMPRESSIONS     1. Left ventricular ejection fraction, by estimation, is 55 to 60%. The  left ventricle has normal function. The left ventricle has no regional  wall motion abnormalities. Left ventricular diastolic parameters are  consistent with Grade I diastolic  dysfunction (impaired relaxation).   2. Right ventricular systolic function is normal. The right ventricular  size is normal.   3. Left  atrial size was mildly dilated.   4. The mitral valve is normal in structure. Trivial mitral valve  regurgitation. No evidence of mitral stenosis.   5. The aortic valve is tricuspid. There is mild calcification of the  aortic valve. Aortic valve regurgitation is trivial. Aortic valve  sclerosis/calcification is present, without any evidence of aortic  stenosis.   6. The inferior vena cava is normal in size with greater than 50%  respiratory variability, suggesting right atrial pressure of 3 mmHg.     PFT     Latest Ref Rng & Units 09/17/2022   10:11 AM 03/04/2020    1:00 PM 09/15/2019    5:13 PM 03/16/2019    8:42 AM 01/29/2018    2:31 PM  ILD indicators  FVC-Pre L 2.78  3.07  2.92  2.91  2.97   FVC-Predicted Pre % 103  104  99  97  98   FVC-Post L     2.89   FVC-Predicted Post %     95   DLCO uncorrected ml/min/mmHg 8.80  12.94  12.84  12.32  14.80   DLCO UNC %Pred % 45  63  63  60  54   DLCO Corrected ml/min/mmHg 8.80  12.94  13.26  12.91    DLCO COR %Pred % 45  63  65  63        LAB RESULTS last 96 hours No results found.  LAB RESULTS last 90 days Recent Results (from the past 2160 hour(s))  Hepatic function panel     Status: None   Collection Time: 08/27/22  8:30 AM  Result Value Ref Range   Total Protein 6.7 6.0 - 8.5 g/dL   Albumin 4.5 3.7 - 4.7 g/dL   Bilirubin Total 0.6 0.0 - 1.2 mg/dL   Bilirubin, Direct 1.61 0.00 - 0.40 mg/dL   Alkaline Phosphatase 99 44 - 121 IU/L   AST 22 0 - 40 IU/L   ALT 14 0 - 32 IU/L  Lipid panel     Status: None   Collection Time: 08/27/22  8:30 AM  Result Value Ref Range   Cholesterol, Total 144 100 - 199 mg/dL   Triglycerides 409 0 - 149 mg/dL   HDL 59 >81 mg/dL   VLDL Cholesterol Cal 18 5 - 40 mg/dL   LDL Chol Calc (NIH) 67 0 - 99 mg/dL   Chol/HDL Ratio 2.4 0.0 - 4.4 ratio    Comment:                                   T. Chol/HDL Ratio                                             Men  Women                               1/2  Avg.Risk  3.4    3.3                                   Avg.Risk  5.0    4.4                                2X Avg.Risk  9.6    7.1                                3X Avg.Risk 23.4   11.0   Pulmonary function test     Status: None   Collection Time: 09/17/22 10:11 AM  Result Value Ref Range   FVC-Pre 2.78 L   FVC-%Pred-Pre 103 %   FEV1-Pre 2.18 L   FEV1-%Pred-Pre 108 %   FEV6-Pre 2.76 L   FEV6-%Pred-Pre 108 %   Pre FEV1/FVC ratio 78 %   FEV1FVC-%Pred-Pre 106 %   Pre FEV6/FVC Ratio 99 %   FEV6FVC-%Pred-Pre 105 %   FEF 25-75 Pre 1.93 L/sec   FEF2575-%Pred-Pre 136 %   DLCO unc 8.80 ml/min/mmHg   DLCO unc % pred 45 %   DLCO cor 8.80 ml/min/mmHg   DLCO cor % pred 45 %   DL/VA 1.91 ml/min/mmHg/L   DL/VA % pred 62 %  B Nat Peptide     Status: Abnormal   Collection Time: 09/17/22 12:05 PM  Result Value Ref Range   Pro B Natriuretic peptide (BNP) 129.0 (H) 0.0 - 100.0 pg/mL  CBC w/Diff     Status: None   Collection Time: 09/17/22 12:05 PM  Result Value Ref Range   WBC 7.8 4.0 - 10.5 K/uL   RBC 4.68 3.87 - 5.11 Mil/uL   Hemoglobin 13.6 12.0 - 15.0 g/dL   HCT 47.8 29.5 - 62.1 %   MCV 91.1 78.0 - 100.0 fl   MCHC 31.9 30.0 - 36.0 g/dL   RDW 30.8 65.7 - 84.6 %   Platelets 232.0 150.0 - 400.0 K/uL   Neutrophils Relative % 56.4 43.0 - 77.0 %   Lymphocytes Relative 31.6 12.0 - 46.0 %   Monocytes Relative 7.8 3.0 - 12.0 %   Eosinophils Relative 3.2  0.0 - 5.0 %   Basophils Relative 1.0 0.0 - 3.0 %   Neutro Abs 4.4 1.4 - 7.7 K/uL   Lymphs Abs 2.5 0.7 - 4.0 K/uL   Monocytes Absolute 0.6 0.1 - 1.0 K/uL   Eosinophils Absolute 0.3 0.0 - 0.7 K/uL   Basophils Absolute 0.1 0.0 - 0.1 K/uL  ECHOCARDIOGRAM COMPLETE     Status: None   Collection Time: 09/18/22  4:13 PM  Result Value Ref Range   Area-P 1/2 3.21 cm2   S' Lateral 3.20 cm   Est EF 55 - 60%          has a past medical history of Allergic rhinitis, GERD (gastroesophageal reflux disease), Glaucoma, Hiatal hernia,  Hyperlipidemia, Hypothyroidism, ILD (interstitial lung disease) (HCC), Lichen planopilaris, MVP (mitral valve prolapse), Restless legs, and Urticaria.   reports that she has never smoked. She has never used smokeless tobacco.  Past Surgical History:  Procedure Laterality Date   APPENDECTOMY     with TAH   CARDIAC CATHETERIZATION  1989   negative   COLONOSCOPY  2005   Dr Juanda Chance   COLONOSCOPY  01/2013   Dr Loreta Ave   esophageal dilation  07/04/2011   Dr Neita Garnet SIGMOIDOSCOPY  1999    Dr Juanda Chance   KNEE ARTHROSCOPY  2001, 2005   Dr Eulah Pont   bilat   TOTAL ABDOMINAL HYSTERECTOMY     for Endometriosis; no BSO, Dr Arletha Grippe   UPPER GI ENDOSCOPY  01/2013   Dr Loreta Ave   VIDEO BRONCHOSCOPY Bilateral 02/05/2019   Procedure: VIDEO BRONCHOSCOPY WITH FLUORO;  Surgeon: Kalman Shan, MD;  Location: [facility]COPY;  Service: Endoscopy;  Laterality: Bilateral;    Allergies  Allergen Reactions   Caffeine     palpitations   Chlorpheniramine-Pseudoeph     palpitations   Maxifed     palpitations   Other Other (See Comments)   Pirfenidone Nausea Only    Esbriet Causes nausea and weakness    Immunization History  Administered Date(s) Administered   Fluad Quad(high Dose 65+) 11/15/2020, 11/14/2021   Influenza Split 11/07/2010   Influenza Whole 10/30/2009, 11/27/2011   Influenza, High Dose Seasonal PF 10/27/2012, 11/05/2013, 10/01/2017, 09/11/2018, 11/23/2019   Influenza-Unspecified 11/24/2014, 10/07/2015, 10/08/2016   PFIZER(Purple Top)SARS-COV-2 Vaccination 03/14/2019, 04/06/2019, 10/27/2019   Pneumococcal Conjugate-13 10/07/2015   Pneumococcal Polysaccharide-23 07/02/2017   Respiratory Syncytial Virus Vaccine,Recomb Aduvanted(Arexvy) 12/27/2021   Tdap 02/11/2012   Zoster Recombinant(Shingrix) 10/01/2017, 12/03/2017   Zoster, Live 01/09/2012    Family History  Problem Relation Age of Onset   Lung cancer Father        Asbestos with mesothelioma   Heart disease Paternal  Grandmother        ? etiology   Diabetes Paternal Grandmother    Diabetes Brother    Uterine cancer Paternal Aunt    Stroke Paternal Grandfather        > 55   Heart attack Brother 66   Allergies Sister    Aneurysm Mother        congenital   Stroke Brother        two strokes   Barrett's esophagus Brother      Current Outpatient Medications:    aspirin EC 81 MG tablet, Take 1 tablet (81 mg total) by mouth daily., Disp: 90 tablet, Rfl: 3   cetirizine (ZYRTEC) 10 MG tablet, Take 10 mg by mouth daily., Disp: , Rfl:    cholecalciferol (VITAMIN D3) 25 MCG (1000 UT) tablet, Take 1,000 Units  by mouth daily., Disp: , Rfl:    clobetasol (TEMOVATE) 0.05 % external solution, , Disp: , Rfl:    esomeprazole (NEXIUM) 40 MG capsule, Take 40 mg by mouth every morning., Disp: , Rfl:    famotidine (PEPCID) 40 MG tablet, Take 1 tablet (40 mg total) by mouth daily., Disp: 90 tablet, Rfl: 1   Ferrous Sulfate Dried (SLOW RELEASE IRON) 45 MG TBCR, Take by mouth., Disp: , Rfl:    fluticasone (FLONASE) 50 MCG/ACT nasal spray, Place 1 spray into both nostrils daily as needed for allergies or rhinitis., Disp: , Rfl:    hydroxychloroquine (PLAQUENIL) 200 MG tablet, Take 1 tablet by mouth twice a day, Disp: 60 tablet, Rfl: 9   magnesium oxide (MAG-OX) 400 MG tablet, Take 400 mg by mouth daily., Disp: , Rfl:    Multiple Vitamins-Minerals (ZINC PO), Take 1 tablet by mouth., Disp: , Rfl:    omeprazole (PRILOSEC) 40 MG capsule, Take 40 mg by mouth daily as needed., Disp: , Rfl:    pravastatin (PRAVACHOL) 40 MG tablet, Take 1 tablet (40 mg total) by mouth daily., Disp: 90 tablet, Rfl: 2   Probiotic Product (PROBIOTIC ADVANCED) CAPS, Take by mouth., Disp: , Rfl:    rOPINIRole (REQUIP) 0.5 MG tablet, TAKE 2 TABLETS BY MOUTH EVERY NIGHT AFTER DINNER, Disp: 180 tablet, Rfl: 1   SYNTHROID 88 MCG tablet, TAKE 1 TABLET DAILY 30 MINUTES BEFORE BREAKFAST, Disp: 90 tablet, Rfl: 3   vitamin B-12 (CYANOCOBALAMIN) 500 MCG tablet,  Take 500 mcg by mouth daily., Disp: , Rfl:       Objective:   Vitals:   10/23/22 1035  BP: 131/78  Pulse: 60  SpO2: 96%  Weight: 140 lb 9.6 oz (63.8 kg)  Height: 5\' 6"  (1.676 m)    Estimated body mass index is 22.69 kg/m as calculated from the following:   Height as of this encounter: 5\' 6"  (1.676 m).   Weight as of this encounter: 140 lb 9.6 oz (63.8 kg).  @WEIGHTCHANGE @  Filed Weights   10/23/22 1035  Weight: 140 lb 9.6 oz (63.8 kg)     Physical Exam   General: No distress. Looks well O2 at rest: no Cane present: no Sitting in wheel chair: no Frail: no Obese: no Neuro: Alert and Oriented x 3. GCS 15. Speech normal Psych: Pleasant Resp:  Barrel Chest - no.  Wheeze - no, Crackles - YES RIGHT BASE, No overt respiratory distress CVS: Normal heart sounds. Murmurs - no Ext: Stigmata of Connective Tissue Disease - no HEENT: Normal upper airway. PEERL +. No post nasal drip        Assessment:       ICD-10-CM   1. IPF (idiopathic pulmonary fibrosis) (HCC)  J84.112 B Nat Peptide    2. Palpitation  R00.2          Plan:     Patient Instructions     ICD-10-CM   1. IPF (idiopathic pulmonary fibrosis) (HCC)  J84.112     2. Palpitation  R00.2       #Idiopathic pulmonary fibrosis  -Am glad symptom wise and exercise hypoxemia wise you are stable compared to baseline.  -CT scan of the chest does not show any evidence of pulmonary fibrosis worsening in the last 2 years  -Based on the above pulmonary fibrosis is stable in the last 2 years  -The only conflicting point here is the change in the breathing test ; therefore you require continued close monitoring  Plan   -  Take consent form for study called [employer] -sponsors [place]  -Continue supportive care otherwise without any antifibrotic per shared decision making  -   #Palpitations with exercise [2 episodes]  -Unclear cause.   -No evidence of pulmonary hypertension on the echocardiogram -  Do not know  if it is got to do with slightly stiff heart muscle is seen on the echocardiogram and the associated slight elevation in BNP -Could also be because of desaturations with heavy exercise  Plan - Repeat BNP today - if still elevated, will discuss with Dr Jens Som --I have sent a message to Dr. Jens Som talking about your palpitation episodes - Monitor pulse oximetry with exercise [have given new examples of a more expensive pulse oximeter that can do that]  Follow-up - 3 months 30-minute visit but after breathing test; but can cancel if in research   FOLLOWUP Return in about 3 months (around 01/23/2023) for 30 min visit, after Cleda Daub and DLCO, ILD, with Dr Marchelle Gearing, Face to Face Visit.  ( Level 05 visit E&M 2024: Estb >= 40 min  visit type: on-site physical face to visit  in total care time and counseling or/and coordination of care by this undersigned MD - Dr Kalman Shan. This includes one or more of the following on this same day 10/23/2022: pre-charting, chart review, note writing, documentation discussion of test results, diagnostic or treatment recommendations, prognosis, risks and benefits of management options, instructions, education, compliance or risk-factor reduction. It excludes time spent by the CMA or office staff in the care of the patient. Actual time 45 min)   SIGNATURE    Dr. Kalman Shan, M.D., F.C.C.P,  Pulmonary and Critical Care Medicine Staff Physician, [facility] Center Director - Interstitial Lung Disease  Program  Pulmonary Fibrosis [facility] at [facility] [place], [place], [address]  Pager: [phone], If no answer or between  15:00h - 7:00h: call [phone] Telephone: [phone]  11:21 AM 10/23/2022

## 2022-10-23 NOTE — Telephone Encounter (Addendum)
HI Elizabeth Espinoza -thanks for seeing on 08/27/2022..  Subsequently saw on 09/17/2022 and she was complaining of more shortness of breath particularly during her 3 mile walk.  I thought pulmonary function test declined but when I did a CT scan of the chest this saying that her pulmonary fibrosis stable for 2 years.  She is also tells me now that her exertional dyspnea is somewhat better and back to her baseline.  However she did mention to me that some 7-10 days ago she had intense episode of palpitation.  Fitbit showed heart rate 170.  This was during her 3 mile walk.  She had a similar episode a couple of months ago [she forgot to tell you about this during the visit 08/27/2022].  Both times relieved by rest.  I did an echo and she only has mild diastolic dysfunction.  BNP slightly high.but is repeat x 2 HIGH > 100  Plan/discussion - Wondering if she needs some additional intervention such as a cardiac stress test or right heart cath [facility] reported as normal] or Zio monitor or maybe just a video visit and  reassure  OR Lasix?  Thanks for the consideration     SIGNATURE    Dr. Kalman Shan, M.D., F.C.C.P,  Pulmonary and Critical Care Medicine Staff Physician, [facility] Center Director - Interstitial Lung Disease  Program  Pulmonary Fibrosis [facility] at [facility] [place], [place], [address]   Pager: [phone], If no answer  -> Check AMION or Try [phone] Telephone (clinical office): [phone] Telephone (research): [phone]  11:06 AM 10/23/2022

## 2022-10-23 NOTE — Patient Instructions (Addendum)
ICD-10-CM   1. IPF (idiopathic pulmonary fibrosis) (HCC)  J84.112     2. Palpitation  R00.2       #Idiopathic pulmonary fibrosis  -Am glad symptom wise and exercise hypoxemia wise you are stable compared to baseline.  -CT scan of the chest does not show any evidence of pulmonary fibrosis worsening in the last 2 years  -Based on the above pulmonary fibrosis is stable in the last 2 years  -The only conflicting point here is the change in the breathing test ; therefore you require continued close monitoring  Plan   -Take consent form for study called [employer] -sponsors [place]  -Continue supportive care otherwise without any antifibrotic per shared decision making  -   #Palpitations with exercise [2 episodes Sept 2024 and July 2024]  -Unclear cause.   -No evidence of pulmonary hypertension on the echocardiogram -  Do not know if it is got to do with slightly stiff heart muscle is seen on the echocardiogram and the associated slight elevation in BNP -Could also be because of desaturations with heavy exercise  Plan - Repeat BNP today - if still elevated, will discuss with Dr Jens Som --I have sent a message to Dr. Jens Som talking about your palpitation episodes - Monitor pulse oximetry with exercise [have given new examples of a more expensive pulse oximeter that can do that]  Follow-up - 3 months 30-minute visit but after breathing test; but can cancel if in research

## 2022-11-02 ENCOUNTER — Encounter: Payer: Self-pay | Admitting: Adult Health

## 2022-11-02 ENCOUNTER — Ambulatory Visit: Payer: Medicare Other | Attending: Adult Health | Admitting: Adult Health

## 2022-11-02 ENCOUNTER — Ambulatory Visit: Payer: Medicare Other | Attending: Adult Health

## 2022-11-02 ENCOUNTER — Telehealth: Payer: Self-pay | Admitting: Cardiology

## 2022-11-02 VITALS — BP 128/72 | HR 75 | Ht 66.0 in | Wt 141.6 lb

## 2022-11-02 DIAGNOSIS — R000 Tachycardia, unspecified: Secondary | ICD-10-CM | POA: Diagnosis not present

## 2022-11-02 DIAGNOSIS — J849 Interstitial pulmonary disease, unspecified: Secondary | ICD-10-CM | POA: Diagnosis not present

## 2022-11-02 DIAGNOSIS — E7800 Pure hypercholesterolemia, unspecified: Secondary | ICD-10-CM | POA: Diagnosis not present

## 2022-11-02 DIAGNOSIS — R002 Palpitations: Secondary | ICD-10-CM

## 2022-11-02 NOTE — Patient Instructions (Signed)
Medication Instructions:  No Changes *If you need a refill on your cardiac medications before your next appointment, please call your pharmacy*   Lab Work: No Labs If you have labs (blood work) drawn today and your tests are completely normal, you will receive your results only by: [place] (if you have MyChart) OR A paper copy in the mail If you have any lab test that is abnormal or we need to change your treatment, we will call you to review the results.   Testing/Procedures: ZIO AT Long term monitor-Live Telemetry  Your physician has requested you wear a ZIO patch monitor for 7 days.  This is a single patch monitor. [employer] supplies one patch monitor per enrollment. Additional  stickers are not available.  Please do not apply patch if you will be having a Nuclear Stress Test, Echocardiogram, Cardiac CT, MRI,  or Chest Xray during the period you would be wearing the monitor. The patch cannot be worn during  these tests. You cannot remove and re-apply the ZIO AT patch monitor.  Your ZIO patch monitor will be mailed 3 day USPS to your address on file. It may take 3-5 days to  receive your monitor after you have been enrolled.  Once you have received your monitor, please review the enclosed instructions. Your monitor has  already been registered assigning a specific monitor serial # to you.   Billing and Patient Assistance Program information  Elizabeth Espinoza has been supplied with any insurance information on record for billing. [employer] offers a sliding scale Patient Assistance Program for patients without insurance, or whose  insurance does not completely cover the cost of the ZIO patch monitor. You must apply for the  Patient Assistance Program to qualify for the discounted rate. To apply, call [employer] at [phone],  select option 4, select option 2 , ask to apply for the Patient Assistance Program, (you can request an  interpreter if needed). [employer] will ask your household  income and how many people are in your  household. [employer] will quote your out-of-pocket cost based on this information. They will also be able  to set up a 12 month interest free payment plan if needed.  Applying the monitor   Shave hair from upper left chest.  Hold the abrader disc by orange tab. Rub the abrader in 40 strokes over left upper chest as indicated in  your monitor instructions.  Clean area with 4 enclosed alcohol pads. Use all pads to ensure the area is cleaned thoroughly. Let  dry.  Apply patch as indicated in monitor instructions. Patch will be placed under collarbone on left side of  chest with arrow pointing upward.  Rub patch adhesive wings for 2 minutes. Remove the white label marked "1". Remove the white label  marked "2". Rub patch adhesive wings for 2 additional minutes.  While looking in a mirror, press and release button in center of patch. A small green light will flash 3-4  times. This will be your only indicator that the monitor has been turned on.  Do not shower for the first 24 hours. You may shower after the first 24 hours.  Press the button if you feel a symptom. You will hear a small click. Record Date, Time and Symptom in  the Patient Log.   Starting the Gateway  In your kit there is a Hydrographic surveyor box the size of a cellphone. This is Airline pilot. It transmits all your  recorded data to [facility]. This box must  always stay within 10 feet of you. Open the box and push the *  button. There will be a light that blinks orange and then green a few times. When the light stops  blinking, the Gateway is connected to the ZIO patch. Call [employer] at [phone] to confirm your monitor is transmitting.  Returning your monitor  Remove your patch and place it inside the Gateway. In the lower half of the Gateway there is a white  bag with prepaid postage on it. Place Gateway in bag and seal. Mail package back to [place] as soon as  possible. Your physician should  have your final report approximately 7 days after you have mailed back  your monitor. Call [facility] Customer Care at [phone] if you have questions regarding your ZIO AT  patch monitor. Call them immediately if you see an orange light blinking on your monitor.  If your monitor falls off in less than 4 days, contact our Monitor department at [phone]. If your  monitor becomes loose or falls off after 4 days call [employer] at [phone] for suggestions on  securing your monitor    Follow-Up: At [facility], you and your health needs are our priority.  As part of our continuing mission to provide you with exceptional heart care, we have created designated Provider Care Teams.  These Care Teams include your primary Cardiologist (physician) and Advanced Practice Providers (APPs -  Physician Assistants and Nurse Practitioners) who all work together to provide you with the care you need, when you need it.  We recommend signing up for the patient portal called "MyChart".  Sign up information is provided on this After Visit Summary.  MyChart is used to connect with patients for Virtual Visits (Telemedicine).  Patients are able to view lab/test results, encounter notes, upcoming appointments, etc.  Non-urgent messages can be sent to your provider as well.   To learn more about what you can do with MyChart, go to [email or web address].    Your next appointment:   1 month(s)  Provider:   Joni Reining, DNP, ANP

## 2022-11-02 NOTE — Progress Notes (Unsigned)
Enrolled patient for a 7 day Zio XT monitor to be mailed to patients home   [name] to read 

## 2022-11-02 NOTE — Telephone Encounter (Signed)
Pt reports HR has went down with rest, now 89 but has occasionally been higher over the last week with more extreme activities such as her 3mi walk each day. Also experiencing DOE and palpitations.Not associated with chest pain. No orthopnea, edema or syncope. Scheduled pt to see APP today.

## 2022-11-02 NOTE — Telephone Encounter (Signed)
Thanks

## 2022-11-02 NOTE — Progress Notes (Signed)
Cardiology Clinic Note   Patient Name: Elizabeth Espinoza Date of Encounter: 11/02/2022  Primary Care Provider:  Pincus Sanes, MD Primary Cardiologist:  [name], MD  Patient Profile    82 year old female with history of chronic dyspnea, minimal coronary artery disease noted on previous CTA chest CT October 2022 showed interstitial lung disease and coronary calcification, hyperlipidemia, he is being followed by pulmonary, Dr. Marchelle Gearing last seen in their office on 10/23/2022 with symptoms of dyspnea, but overall felt better.  She did desaturate with walking pulse ox but she was also wearing a mask.    Past Medical History    Past Medical History:  Diagnosis Date   Allergic rhinitis    GERD (gastroesophageal reflux disease)    Glaucoma    SUSPECT   Hiatal hernia    Hyperlipidemia    LDL goal = < 100   Hypothyroidism    ILD (interstitial lung disease) (HCC)    Lichen planopilaris    MVP (mitral valve prolapse)    documented on 2 D ECHO   Restless legs    Urticaria    Past Surgical History:  Procedure Laterality Date   APPENDECTOMY     with TAH   CARDIAC CATHETERIZATION  1989   negative   COLONOSCOPY  2005   Dr Juanda Chance   COLONOSCOPY  01/2013   Dr Loreta Ave   esophageal dilation  07/04/2011   Dr Neita Garnet SIGMOIDOSCOPY  1999    Dr Juanda Chance   KNEE ARTHROSCOPY  2001, 2005   Dr Eulah Pont   bilat   TOTAL ABDOMINAL HYSTERECTOMY     for Endometriosis; no BSO, Dr Arletha Grippe   UPPER GI ENDOSCOPY  01/2013   Dr Loreta Ave   VIDEO BRONCHOSCOPY Bilateral 02/05/2019   Procedure: VIDEO BRONCHOSCOPY WITH FLUORO;  Surgeon: Kalman Shan, MD;  Location: [facility];  Service: Endoscopy;  Laterality: Bilateral;    Allergies  Allergies  Allergen Reactions   Caffeine     palpitations   Chlorpheniramine-Pseudoeph     palpitations   Maxifed     palpitations   Other Other (See Comments)   Pirfenidone Nausea Only    Esbriet Causes nausea and weakness    History of Present  Illness    Elizabeth Espinoza comes today as an add-on with complaints of tachycardia.  She normally walks 3 miles at [employer] with her husband 5 days a week.  Normally her heart rate goes up as usual with exercise but she has had episodes where her heart rate has begun to raise up to 170 bpm and she begins to feel chest pressure worsening shortness of breath and fatigue associated.  She is chronically short of breath and is unconcerned by this.  But her heart rate has been very elevated "out of the blue" and then returns to normal after several minutes.  She has a Fitbit watch and it has clocked her heart rate up to 170 bpm leading to this appointment.  This has happened 3-4 times over the last few months and that became concerning to her today when she was walking with her husband.  Home Medications    Current Outpatient Medications  Medication Sig Dispense Refill   aspirin EC 81 MG tablet Take 1 tablet (81 mg total) by mouth daily. 90 tablet 3   cetirizine (ZYRTEC) 10 MG tablet Take 10 mg by mouth daily.     cholecalciferol (VITAMIN D3) 25 MCG (1000 UT) tablet Take 1,000 Units by mouth daily.  clobetasol (TEMOVATE) 0.05 % external solution      esomeprazole (NEXIUM) 40 MG capsule Take 40 mg by mouth every morning.     famotidine (PEPCID) 40 MG tablet Take 1 tablet (40 mg total) by mouth daily. 90 tablet 1   Ferrous Sulfate Dried (SLOW RELEASE IRON) 45 MG TBCR Take by mouth.     fluticasone (FLONASE) 50 MCG/ACT nasal spray Place 1 spray into both nostrils daily as needed for allergies or rhinitis.     hydroxychloroquine (PLAQUENIL) 200 MG tablet Take 1 tablet by mouth twice a day 60 tablet 9   magnesium oxide (MAG-OX) 400 MG tablet Take 400 mg by mouth daily.     Multiple Vitamins-Minerals (ZINC PO) Take 1 tablet by mouth.     omeprazole (PRILOSEC) 40 MG capsule Take 40 mg by mouth daily as needed.     pravastatin (PRAVACHOL) 40 MG tablet Take 1 tablet (40 mg total) by mouth daily. 90 tablet  2   Probiotic Product (PROBIOTIC ADVANCED) CAPS Take by mouth.     rOPINIRole (REQUIP) 0.5 MG tablet TAKE 2 TABLETS BY MOUTH EVERY NIGHT AFTER DINNER 180 tablet 1   SYNTHROID 88 MCG tablet TAKE 1 TABLET DAILY 30 MINUTES BEFORE BREAKFAST 90 tablet 3   vitamin B-12 (CYANOCOBALAMIN) 500 MCG tablet Take 500 mcg by mouth daily.     No current facility-administered medications for this visit.     Family History    Family History  Problem Relation Age of Onset   Lung cancer Father        Asbestos with mesothelioma   Heart disease Paternal Grandmother        ? etiology   Diabetes Paternal Grandmother    Diabetes Brother    Uterine cancer Paternal Aunt    Stroke Paternal Grandfather        > 55   Heart attack Brother 2   Allergies Sister    Aneurysm Mother        congenital   Stroke Brother        two strokes   Barrett's esophagus Brother    She indicated that her mother is deceased. She indicated that her father is deceased. She indicated that the status of her sister is unknown. She indicated that the status of her paternal grandmother is unknown. She indicated that the status of her paternal grandfather is unknown. She indicated that the status of her paternal aunt is unknown.  Social History    Social History   Socioeconomic History   Marital status: Married    Spouse name: Fayrene Fearing   Number of children: 2   Years of education: Not on file   Highest education level: Not on file  Occupational History   Occupation: retired from [employer] retired  Tobacco Use   Smoking status: Never   Smokeless tobacco: Never  Vaping Use   Vaping status: Never Used  Substance and Sexual Activity   Alcohol use: Yes    Comment:  rarely   Drug use: No   Sexual activity: Yes  Other Topics Concern   Not on file  Social History Narrative   Not on file   Social Determinants of Health   Financial Resource Strain: Low Risk  (03/27/2022)   Overall Financial Resource Strain (CARDIA)     Difficulty of Paying Living Expenses: Not hard at all  Food Insecurity: No Food Insecurity (03/27/2022)   Hunger Vital Sign    Worried About Running Out of Food in the  Last Year: Never true    Ran Out of Food in the Last Year: Never true  Transportation Needs: No Transportation Needs (03/27/2022)   PRAPARE - Administrator, Civil Service (Medical): No    Lack of Transportation (Non-Medical): No  Physical Activity: Sufficiently Active (03/27/2022)   Exercise Vital Sign    Days of Exercise per Week: 6 days    Minutes of Exercise per Session: 50 min  Stress: No Stress Concern Present (03/27/2022)   [employer] of Occupational Health - Occupational Stress Questionnaire    Feeling of Stress : Not at all  Social Connections: Moderately Isolated (03/27/2022)   Social Connection and Isolation Panel [NHANES]    Frequency of Communication with Friends and Family: More than three times a week    Frequency of Social Gatherings with Friends and Family: More than three times a week    Attends Religious Services: Never    Database administrator or Organizations: No    Attends Banker Meetings: Never    Marital Status: Married  Catering manager Violence: Not At Risk (03/27/2022)   Humiliation, Afraid, Rape, and Kick questionnaire    Fear of Current or Ex-Partner: No    Emotionally Abused: No    Physically Abused: No    Sexually Abused: No     Review of Systems    General:  No chills, fever, night sweats or weight changes.  Cardiovascular: Positive for chest pressure, tachycardia, and chronic dyspnea on exertion, no edema, orthopnea, palpitations, paroxysmal nocturnal dyspnea. Dermatological: No rash, lesions/masses Respiratory: No cough, dyspnea Urologic: No hematuria, dysuria Abdominal:   No nausea, vomiting, diarrhea, bright red blood per rectum, melena, or hematemesis Neurologic:  No visual changes, wkns, changes in mental status. All other systems reviewed and are  otherwise negative except as noted above.  EKG Interpretation Date/Time:  Friday November 02 2022 13:49:35 EDT Ventricular Rate:  75 PR Interval:  152 QRS Duration:  74 QT Interval:  392 QTC Calculation: 437 R Axis:   46  Text Interpretation: Normal sinus rhythm Nonspecific ST and T wave abnormality When compared with ECG of 27-Aug-2022 07:59, Nonspecific T wave abnormality has replaced inverted T waves in Inferior leads Confirmed by Joni Reining [phone]) on 11/02/2022 1:55:53 PM    Physical Exam    VS:  BP 128/72 (BP Location: Left Arm, Patient Position: Sitting, Cuff Size: Normal)   Pulse 75   Ht 5\' 6"  (1.676 m)   Wt 141 lb 9.6 oz (64.2 kg)   SpO2 95%   BMI 22.85 kg/m  , BMI Body mass index is 22.85 kg/m.     GEN: Well nourished, well developed, in no acute distress. HEENT: normal. Neck: Supple, no JVD, carotid bruits, or masses. Cardiac: RRR, no murmurs, rubs, or gallops. No clubbing, cyanosis, edema.  Radials/DP/PT 2+ and equal bilaterally.  Respiratory:  Respirations regular and unlabored, diminished breath sounds in the bases,  Some shortness of breath with speaking  GI: Soft, nontender, nondistended, BS + x 4. MS: no deformity or atrophy. Skin: warm and dry, no rash. Neuro:  Strength and sensation are intact. Psych: Normal affect.  EKG Interpretation Date/Time:  Friday November 02 2022 13:49:35 EDT Ventricular Rate:  75 PR Interval:  152 QRS Duration:  74 QT Interval:  392 QTC Calculation: 437 R Axis:   46  Text Interpretation: Normal sinus rhythm Nonspecific ST and T wave abnormality When compared with ECG of 27-Aug-2022 07:59, Nonspecific T wave abnormality has replaced  inverted T waves in Inferior leads Confirmed by Joni Reining [phone]) on 11/02/2022 1:55:53 PM   Lab Results  Component Value Date   WBC 7.8 09/17/2022   HGB 13.6 09/17/2022   HCT 42.6 09/17/2022   MCV 91.1 09/17/2022   PLT 232.0 09/17/2022   Lab Results  Component Value Date    CREATININE 0.77 05/01/2022   BUN 17 05/01/2022   NA 140 05/01/2022   K 4.3 05/01/2022   CL 105 05/01/2022   CO2 25 05/01/2022   Lab Results  Component Value Date   ALT 14 08/27/2022   AST 22 08/27/2022   ALKPHOS 99 08/27/2022   BILITOT 0.6 08/27/2022   Lab Results  Component Value Date   CHOL 144 08/27/2022   HDL 59 08/27/2022   LDLCALC 67 08/27/2022   TRIG 101 08/27/2022   CHOLHDL 2.4 08/27/2022    Lab Results  Component Value Date   HGBA1C 5.8 05/01/2022     Review of Prior Studies EKG Interpretation Date/Time:  Friday November 02 2022 13:49:35 EDT Ventricular Rate:  75 PR Interval:  152 QRS Duration:  74 QT Interval:  392 QTC Calculation: 437 R Axis:   46  Text Interpretation: Normal sinus rhythm Nonspecific ST and T wave abnormality When compared with ECG of 27-Aug-2022 07:59, Nonspecific T wave abnormality has replaced inverted T waves in Inferior leads Confirmed by Joni Reining [phone]) on 11/02/2022 1:55:53 PM    Echocardiogram 09/18/2022 1. Left ventricular ejection fraction, by estimation, is 55 to 60%. The  left ventricle has normal function. The left ventricle has no regional  wall motion abnormalities. Left ventricular diastolic parameters are  consistent with Grade I diastolic  dysfunction (impaired relaxation).   2. Right ventricular systolic function is normal. The right ventricular  size is normal.   3. Left atrial size was mildly dilated.   4. The mitral valve is normal in structure. Trivial mitral valve  regurgitation. No evidence of mitral stenosis.   5. The aortic valve is tricuspid. There is mild calcification of the  aortic valve. Aortic valve regurgitation is trivial. Aortic valve  sclerosis/calcification is present, without any evidence of aortic  stenosis.   6. The inferior vena cava is normal in size with greater than 50%  respiratory variability, suggesting right atrial pressure of 3 mmHg.   Assessment & Plan   1.  Tachycardia:  She normally exercises every day walking 3 miles at [employer] [place] gym with her husband, however, she is having bursts of rapid heart rhythm up to 170 bpm as reported by her Fitbit, with associated chest pressure.  She notes that her heart rate is up that fast because of the chest pressure and she begins to feel very tired.  As result of this I am going to place a 7-day ZIO monitor, live version, for her to wear with and without exercise.  Will be able to make further recommendations once results are available.  I am reluctant to place the patient on a beta-blocker at this time especially with interstitial lung disease.  Will rule out atrial fibrillation.  2.  Interstitial lung disease: Chronic for her and being followed by Dr. Marchelle Gearing, pulmonologist.  Chronic dyspnea on exertion.  This is not limiting the patient concerning doing exercise such as walking each day as described.  Continue management and follow-up testing at the discretion of pulmonary.  3.  Hyperlipidemia: She remains on pravastatin 40 mg daily.  Labs are followed by PCP.  Signed, Bettey Mare. Liborio Nixon, ANP, AACC   11/02/2022 4:28 PM      Office [phone] Fax [phone]  Notice: This dictation was prepared with Dragon dictation along with smaller phrase technology. Any transcriptional errors that result from this process are unintentional and may not be corrected upon review.

## 2022-11-02 NOTE — Telephone Encounter (Signed)
STAT if HR is under 50 or over 120 (normal HR is 60-100 beats per minute)  What is your heart rate? 171  Do you have a log of your heart rate readings (document readings)? No   Do you have any other symptoms? Very tired, headache

## 2022-11-07 DIAGNOSIS — R002 Palpitations: Secondary | ICD-10-CM | POA: Diagnosis not present

## 2022-11-14 ENCOUNTER — Encounter: Payer: Self-pay | Admitting: Internal Medicine

## 2022-11-14 NOTE — Telephone Encounter (Signed)
Please advise,  I do not recall getting a message. This patient does see cardiology, does she need to contact them as well? Last set of labs were: 01/2022

## 2022-11-15 ENCOUNTER — Other Ambulatory Visit: Payer: Self-pay | Admitting: Internal Medicine

## 2022-11-15 DIAGNOSIS — E039 Hypothyroidism, unspecified: Secondary | ICD-10-CM

## 2022-11-16 ENCOUNTER — Other Ambulatory Visit (INDEPENDENT_AMBULATORY_CARE_PROVIDER_SITE_OTHER): Payer: Medicare Other

## 2022-11-16 DIAGNOSIS — E039 Hypothyroidism, unspecified: Secondary | ICD-10-CM | POA: Diagnosis not present

## 2022-11-16 LAB — TSH: TSH: 2.18 u[IU]/mL (ref 0.35–5.50)

## 2022-11-16 LAB — T4, FREE: Free T4: 1.02 ng/dL (ref 0.60–1.60)

## 2022-11-20 DIAGNOSIS — R002 Palpitations: Secondary | ICD-10-CM | POA: Diagnosis not present

## 2022-11-22 ENCOUNTER — Telehealth: Payer: Self-pay

## 2022-11-22 MED ORDER — METOPROLOL TARTRATE 25 MG PO TABS: 12.5000 mg | ORAL_TABLET | Freq: Two times a day (BID) | ORAL | 3 refills | Status: DC

## 2022-11-22 NOTE — Telephone Encounter (Signed)
-----   Message from Joni Reining sent at 11/21/2022 10:51 AM EDT ----- Read the Zio monitor report. She is have several episodes of fast heart rates, up to 15 beats at a time. Since this is symptomatic for her, I would like to start her on low does metoprolol tartrate 12.5 mg daily(1/2 of a 25 mg tablet). Usually taken twice a day. Since she is only feeling this during the day we can try this for daytime use only.  If breathing status becomes worse or she starts wheezing she is to call us.

## 2022-11-22 NOTE — Telephone Encounter (Addendum)
Called patient regarding results. Patient advise to new prescription for Metoprolol Tartrate 12.5 mg Twice Daily. Patient had understanding of Results.----- Message from Joni Reining sent at 11/21/2022 10:51 AM EDT ----- Read the Zio monitor report. She is have several episodes of fast heart rates, up to 15 beats at a time. Since this is symptomatic for her, I would like to start her on low does metoprolol tartrate 12.5 mg daily(1/2 of a 25 mg tablet). Usually taken twice a day. Since she is only feeling this during the day we can try this for daytime use only.  If breathing status becomes worse or she starts wheezing she is to call us.

## 2022-11-23 ENCOUNTER — Telehealth: Payer: Self-pay

## 2022-11-23 NOTE — Telephone Encounter (Addendum)
Called patient regarding . Patient had understanding of results.----- Message from Joni Reining sent at 11/21/2022 10:51 AM EDT ----- Read the Zio monitor report. She is have several episodes of fast heart rates, up to 15 beats at a time. Since this is symptomatic for her, I would like to start her on low does metoprolol tartrate 12.5 mg daily(1/2 of a 25 mg tablet). Usually taken twice a day. Since she is only feeling this during the day we can try this for daytime use only.  If breathing status becomes worse or she starts wheezing she is to call us.

## 2022-12-04 DIAGNOSIS — L720 Epidermal cyst: Secondary | ICD-10-CM | POA: Diagnosis not present

## 2022-12-04 DIAGNOSIS — L570 Actinic keratosis: Secondary | ICD-10-CM | POA: Diagnosis not present

## 2022-12-04 DIAGNOSIS — D225 Melanocytic nevi of trunk: Secondary | ICD-10-CM | POA: Diagnosis not present

## 2022-12-04 DIAGNOSIS — L820 Inflamed seborrheic keratosis: Secondary | ICD-10-CM | POA: Diagnosis not present

## 2022-12-04 DIAGNOSIS — D692 Other nonthrombocytopenic purpura: Secondary | ICD-10-CM | POA: Diagnosis not present

## 2022-12-05 ENCOUNTER — Other Ambulatory Visit: Payer: Self-pay | Admitting: Internal Medicine

## 2022-12-05 DIAGNOSIS — N342 Other urethritis: Secondary | ICD-10-CM | POA: Diagnosis not present

## 2022-12-05 DIAGNOSIS — R300 Dysuria: Secondary | ICD-10-CM | POA: Diagnosis not present

## 2022-12-05 DIAGNOSIS — Z01419 Encounter for gynecological examination (general) (routine) without abnormal findings: Secondary | ICD-10-CM | POA: Diagnosis not present

## 2022-12-05 DIAGNOSIS — R102 Pelvic and perineal pain: Secondary | ICD-10-CM | POA: Diagnosis not present

## 2022-12-06 NOTE — Progress Notes (Signed)
Cardiology Office Note:  .   Date:  12/07/2022  ID:  Elizabeth Espinoza, DOB [birth year], MRN [id number] PCP: Pincus Sanes, MD  [facility] Providers Cardiologist:Brian Jens Som, MD  }   History of Present Illness: .   Elizabeth Espinoza is a 82 y.o. female with history of chronic dyspnea, minimal coronary artery disease noted on previous CTA chest CT October 2022 showed interstitial lung disease and coronary calcification, hyperlipidemia, he is being followed by pulmonary, Dr. Marchelle Gearing.  I last office visit the patient complained of racing heart rate while exercising and gym with her husband.  This occurred spontaneously while she was walking around the track.  Subsided on its own.  I placed a 7-day ZIO monitor to evaluate for arrhythmias, atrial fibrillation or PSVT.  She was found to have 30 episodes PSVT but no evidence of atrial fibrillation.  Average heart rate of 73 bpm.  She was placed on metoprolol 12.5 mg daily with plans to titrate to twice daily if she continues to have symptoms.  She is here for follow-up.   She has not tolerated low-dose metoprolol.  She states this made her feel very jittery and uncomfortable.  It has not affected her breathing, her heart rate has been better controlled.  She has not been exercising as much because of her breathing status in general.  She is due to follow-up with pulmonology concerning lung disease.  The patient has been offered oxygen but she does not wish to use this until absolutely necessary.    ROS: As above otherwise negative.  Be  Studies Reviewed: Marland Kitchen       Zio Monitor 11/20/2022 Patch Wear Time:  7 days and 0 hours (2024-10-16T09:19:12-398 to 2024-10-23T09:54:09-398)   Patient had a min HR of 40 bpm, max HR of 222 bpm, and avg HR of 73 bpm. Predominant underlying rhythm was Sinus Rhythm. Slight P wave morphology changes were noted. 30 Supraventricular Tachycardia runs occurred, the run with the fastest interval lasting  11 beats  with a max rate of 222 bpm, the longest lasting 15 beats with an avg rate of 96 bpm. Isolated SVEs were rare (<1.0%), SVE Couplets were rare (<1.0%), and SVE Triplets were rare (<1.0%). Isolated VEs were rare (<1.0%), VE Couplets were rare  (<1.0%), and no VE Triplets were present. Ventricular Bigeminy and Trigeminy were present.   Sinus bradycardia, normal sinus rhythm, sinus tachycardia, occasional PAC, short runs of SVT (longest 15 beats), occasional PVC and rare couplet. [name]   Physical Exam:   VS:  BP 110/62 (BP Location: Left Arm, Patient Position: Sitting, Cuff Size: Normal)   Pulse 64   Ht 5\' 6"  (1.676 m)   Wt 140 lb (63.5 kg)   SpO2 98%   BMI 22.60 kg/m    Wt Readings from Last 3 Encounters:  12/07/22 140 lb (63.5 kg)  11/02/22 141 lb 9.6 oz (64.2 kg)  10/23/22 140 lb 9.6 oz (63.8 kg)    GEN: Well nourished, well developed in no acute distress NECK: No JVD; No carotid bruits CARDIAC: RRR, no murmurs, rubs, gallops RESPIRATORY:  Clear to auscultation without rales, wheezing or rhonchi  ABDOMEN: Soft, non-tender, non-distended EXTREMITIES:  No edema; No deformity   ASSESSMENT AND PLAN: .    1.PSVT: Per ZIO monitor the patient had 30 episodes of SVT longest lasting 11 beats with a maximal heart rate of 22 bpm.  I did place her on low-dose metoprolol 12.5 mg to see if this would  be helpful to her.  She was unable to tolerate it stating that it made her feel jittery.  Heart rate did improve.  She has not exercised since starting on the metoprolol dose therefore we are uncertain if she is having salvos of this without exercise.  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  I am going to take her off the metoprolol she was unable to tolerate this.  I do want to discuss this with Dr. Jens Som in more detail.  I would also like Dr. Jens Som to see her on follow-up as he may have other  recommendations or need to refer to EP.  With her lung disease I did not wish to start her on any other type of beta-blocker.  Blood pressure is soft and therefore would not want her on any type of calcium channel blocker currently.   2.  Chronic interstitial lung disease: She has been offered oxygen by pulmonary and is decided to hold off on this as long as possible.  She is followed by Dr. Marchelle Gearing and I have suggested that she see him sooner concerning her breathing status and exercise intolerance.  Uncertain if this is contributing to elevated heart rate with exercise although likely.  May need to consider pulmonary rehabilitation at the discretion of pulmonologist.  She states she did undergo this in the past and did well.  Will defer.       Signed, Bettey Mare. Liborio Nixon, ANP, AACC

## 2022-12-07 ENCOUNTER — Ambulatory Visit: Payer: Medicare Other | Attending: Adult Health | Admitting: Adult Health

## 2022-12-07 ENCOUNTER — Encounter: Payer: Self-pay | Admitting: Adult Health

## 2022-12-07 VITALS — BP 110/62 | HR 64 | Ht 66.0 in | Wt 140.0 lb

## 2022-12-07 DIAGNOSIS — I471 Supraventricular tachycardia, unspecified: Secondary | ICD-10-CM

## 2022-12-07 DIAGNOSIS — J849 Interstitial pulmonary disease, unspecified: Secondary | ICD-10-CM

## 2022-12-07 NOTE — Patient Instructions (Signed)
Medication Instructions:  No Changes *If you need a refill on your cardiac medications before your next appointment, please call your pharmacy*   Lab Work: No labs If you have labs (blood work) drawn today and your tests are completely normal, you will receive your results only by: MyChart Message (if you have MyChart) OR A paper copy in the mail If you have any lab test that is abnormal or we need to change your treatment, we will call you to review the results.   Testing/Procedures: No Testing   Follow-Up: At [facility], you and your health needs are our priority.  As part of our continuing mission to provide you with exceptional heart care, we have created designated Provider Care Teams.  These Care Teams include your primary Cardiologist (physician) and Advanced Practice Providers (APPs -  Physician Assistants and Nurse Practitioners) who all work together to provide you with the care you need, when you need it.  We recommend signing up for the patient portal called "MyChart".  Sign up information is provided on this After Visit Summary.  MyChart is used to connect with patients for Virtual Visits (Telemedicine).  Patients are able to view lab/test results, encounter notes, upcoming appointments, etc.  Non-urgent messages can be sent to your provider as well.   To learn more about what you can do with MyChart, go to [email or web address].    Your next appointment:   First Available ( Doctor Only)  Provider:   [name], MD

## 2022-12-11 DIAGNOSIS — K080 Exfoliation of teeth due to systemic causes: Secondary | ICD-10-CM | POA: Diagnosis not present

## 2022-12-13 ENCOUNTER — Other Ambulatory Visit: Payer: Self-pay | Admitting: Internal Medicine

## 2023-01-01 DIAGNOSIS — R194 Change in bowel habit: Secondary | ICD-10-CM | POA: Diagnosis not present

## 2023-01-01 DIAGNOSIS — R140 Abdominal distension (gaseous): Secondary | ICD-10-CM | POA: Diagnosis not present

## 2023-01-01 DIAGNOSIS — K449 Diaphragmatic hernia without obstruction or gangrene: Secondary | ICD-10-CM | POA: Diagnosis not present

## 2023-01-31 ENCOUNTER — Ambulatory Visit: Payer: BLUE CROSS/BLUE SHIELD | Admitting: Internal Medicine

## 2023-02-04 ENCOUNTER — Telehealth: Payer: Self-pay

## 2023-02-04 NOTE — Telephone Encounter (Signed)
 Copied from CRM [id number]. Topic: Clinical - Medical Advice >> Feb 01, 2023  3:55 PM Melissa C wrote: Reason for CRM: patient received Mychart message about upcoming lab work that may need to be scheduled? She didn't know what it was regarding and agent did not see the message so I let her know that I would ask if the office could please advise to patient what this may be concerning. Thank you.

## 2023-02-05 NOTE — Telephone Encounter (Signed)
 Spoke with patient today.

## 2023-02-14 DIAGNOSIS — L438 Other lichen planus: Secondary | ICD-10-CM | POA: Diagnosis not present

## 2023-02-14 DIAGNOSIS — Z79899 Other long term (current) drug therapy: Secondary | ICD-10-CM | POA: Diagnosis not present

## 2023-02-14 DIAGNOSIS — H40013 Open angle with borderline findings, low risk, bilateral: Secondary | ICD-10-CM | POA: Diagnosis not present

## 2023-02-14 DIAGNOSIS — H04123 Dry eye syndrome of bilateral lacrimal glands: Secondary | ICD-10-CM | POA: Diagnosis not present

## 2023-02-15 ENCOUNTER — Other Ambulatory Visit: Payer: Self-pay | Admitting: *Deleted

## 2023-02-15 DIAGNOSIS — J84112 Idiopathic pulmonary fibrosis: Secondary | ICD-10-CM

## 2023-02-18 ENCOUNTER — Ambulatory Visit: Payer: Medicare Other | Admitting: Internal Medicine

## 2023-02-18 DIAGNOSIS — J84112 Idiopathic pulmonary fibrosis: Secondary | ICD-10-CM | POA: Diagnosis not present

## 2023-02-18 LAB — PULMONARY FUNCTION TEST
DL/VA % pred: 57 %
DL/VA: 2.32 ml/min/mmHg/L
DLCO cor % pred: 47 %
DLCO cor: 9.27 ml/min/mmHg
DLCO unc % pred: 47 %
DLCO unc: 9.27 ml/min/mmHg
FEF 25-75 Pre: 1.74 L/s
FEF2575-%Pred-Pre: 123 %
FEV1-%Pred-Pre: 106 %
FEV1-Pre: 2.12 L
FEV1FVC-%Pred-Pre: 104 %
FEV6-%Pred-Pre: 108 %
FEV6-Pre: 2.76 L
FEV6FVC-%Pred-Pre: 105 %
FVC-%Pred-Pre: 102 %
FVC-Pre: 2.77 L
Pre FEV1/FVC ratio: 77 %
Pre FEV6/FVC Ratio: 100 %

## 2023-02-18 NOTE — Patient Instructions (Signed)
Spirometry/diffusion capacity performed today.

## 2023-02-18 NOTE — Progress Notes (Signed)
Worsening in DLCO compared to 5years ago and 2 years ago but stable since 6 months ago

## 2023-02-18 NOTE — Progress Notes (Signed)
Spirometry/diffusion capacity performed today.

## 2023-02-19 NOTE — Progress Notes (Signed)
HPI: FU dyspnea and coronary calcification. ETT September 2016 normal. CPX January 2020 showed normal functional capacity for age with duration 8:45; no ST changes; patient complained of chest pain, dyspnea and dizziness; there were other abnormal parameters suggesting cardiovascular limitation and also pulmonary impairment. Cardiac CTA June 2020 showed a calcium score of 0 and minimal nonobstructive coronary disease. ABIs May 2024 normal.  Echocardiogram August 2024 showed normal LV function, grade 1 diastolic dysfunction, mild LAE and trace aortic regurgitation.  Chest CT August 2024 showed UIP.  Seen previously with palpitations.  Monitor October 2024 showed sinus rhythm with occasional PAC, short runs of SVT, PVC and rare couplet.  She did not tolerate metoprolol.  Since last seen she does have dyspnea on exertion due to her lung disease.  No orthopnea, PND, pedal edema, exertional chest pain or syncope.  Current Outpatient Medications  Medication Sig Dispense Refill   aspirin EC 81 MG tablet Take 1 tablet (81 mg total) by mouth daily. 90 tablet 3   cetirizine (ZYRTEC) 10 MG tablet Take 10 mg by mouth daily.     cholecalciferol (VITAMIN D3) 25 MCG (1000 UT) tablet Take 1,000 Units by mouth daily.     clobetasol (TEMOVATE) 0.05 % external solution      esomeprazole (NEXIUM) 40 MG capsule Take 40 mg by mouth every morning.     famotidine (PEPCID) 40 MG tablet Take 1 tablet (40 mg total) by mouth daily. 90 tablet 1   fluticasone (FLONASE) 50 MCG/ACT nasal spray Place 1 spray into both nostrils daily as needed for allergies or rhinitis.     hydroxychloroquine (PLAQUENIL) 200 MG tablet Take 1 tablet by mouth twice a day 60 tablet 9   magnesium oxide (MAG-OX) 400 MG tablet Take 400 mg by mouth daily.     Multiple Vitamins-Minerals (ZINC PO) Take 1 tablet by mouth.     omeprazole (PRILOSEC) 40 MG capsule Take 40 mg by mouth daily as needed.     pravastatin (PRAVACHOL) 40 MG tablet TAKE 1 TABLET  BY MOUTH DAILY GENERIC EQUIVALENT FOR PRAVACHOL 90 tablet 2   Probiotic Product (PROBIOTIC ADVANCED) CAPS Take by mouth.     SYNTHROID 88 MCG tablet TAKE 1 TABLET DAILY 30 MINUTES BEFORE BREAKFAST 90 tablet 3   vitamin B-12 (CYANOCOBALAMIN) 500 MCG tablet Take 500 mcg by mouth daily.     Ferrous Sulfate Dried (SLOW RELEASE IRON) 45 MG TBCR Take by mouth. (Patient not taking: Reported on 03/05/2023)     metoprolol tartrate (LOPRESSOR) 25 MG tablet Take 0.5 tablets (12.5 mg total) by mouth 2 (two) times daily. 60 tablet 3   rOPINIRole (REQUIP) 0.5 MG tablet TAKE 2 TABLETS BY MOUTH EVERY NIGHT AFTER DINNER (Patient not taking: Reported on 03/05/2023) 180 tablet 1   No current facility-administered medications for this visit.     Past Medical History:  Diagnosis Date   Allergic rhinitis    GERD (gastroesophageal reflux disease)    Glaucoma    SUSPECT   Hiatal hernia    Hyperlipidemia    LDL goal = < 100   Hypothyroidism    ILD (interstitial lung disease) (HCC)    Lichen planopilaris    MVP (mitral valve prolapse)    documented on 2 D ECHO   Restless legs    Urticaria     Past Surgical History:  Procedure Laterality Date   APPENDECTOMY     with TAH   CARDIAC CATHETERIZATION  1989   negative  COLONOSCOPY  2005   Dr Juanda Chance   COLONOSCOPY  01/2013   Dr Loreta Ave   esophageal dilation  07/04/2011   Dr Neita Garnet SIGMOIDOSCOPY  1999    Dr Juanda Chance   KNEE ARTHROSCOPY  2001, 2005   Dr Eulah Pont   bilat   TOTAL ABDOMINAL HYSTERECTOMY     for Endometriosis; no BSO, Dr Arletha Grippe   UPPER GI ENDOSCOPY  01/2013   Dr Loreta Ave   VIDEO BRONCHOSCOPY Bilateral 02/05/2019   Procedure: VIDEO BRONCHOSCOPY WITH FLUORO;  Surgeon: Kalman Shan, MD;  Location: [facility] ENDOSCOPY;  Service: Endoscopy;  Laterality: Bilateral;    Social History   Socioeconomic History   Marital status: Married    Spouse name: Fayrene Fearing   Number of children: 2   Years of education: Not on file   Highest education level:  Not on file  Occupational History   Occupation: retired from [employer] retired  Tobacco Use   Smoking status: Never   Smokeless tobacco: Never  Vaping Use   Vaping status: Never Used  Substance and Sexual Activity   Alcohol use: Yes    Comment:  rarely   Drug use: No   Sexual activity: Yes  Other Topics Concern   Not on file  Social History Narrative   Not on file   Social Drivers of Health   Financial Resource Strain: Low Risk  (03/27/2022)   Overall Financial Resource Strain (CARDIA)    Difficulty of Paying Living Expenses: Not hard at all  Food Insecurity: No Food Insecurity (03/27/2022)   Hunger Vital Sign    Worried About Running Out of Food in the Last Year: Never true    Ran Out of Food in the Last Year: Never true  Transportation Needs: No Transportation Needs (03/27/2022)   PRAPARE - Administrator, Civil Service (Medical): No    Lack of Transportation (Non-Medical): No  Physical Activity: Sufficiently Active (03/27/2022)   Exercise Vital Sign    Days of Exercise per Week: 6 days    Minutes of Exercise per Session: 50 min  Stress: No Stress Concern Present (03/27/2022)   [employer] of Occupational Health - Occupational Stress Questionnaire    Feeling of Stress : Not at all  Social Connections: Moderately Isolated (03/27/2022)   Social Connection and Isolation Panel [NHANES]    Frequency of Communication with Friends and Family: More than three times a week    Frequency of Social Gatherings with Friends and Family: More than three times a week    Attends Religious Services: Never    Database administrator or Organizations: No    Attends Banker Meetings: Never    Marital Status: Married  Catering manager Violence: Not At Risk (03/27/2022)   Humiliation, Afraid, Rape, and Kick questionnaire    Fear of Current or Ex-Partner: No    Emotionally Abused: No    Physically Abused: No    Sexually Abused: No    Family History  Problem Relation  Age of Onset   Lung cancer Father        Asbestos with mesothelioma   Heart disease Paternal Grandmother        ? etiology   Diabetes Paternal Grandmother    Diabetes Brother    Uterine cancer Paternal Aunt    Stroke Paternal Grandfather        > 55   Heart attack Brother 94   Allergies Sister    Aneurysm Mother  congenital   Stroke Brother        two strokes   Barrett's esophagus Brother     ROS: no fevers or chills, productive cough, hemoptysis, dysphasia, odynophagia, melena, hematochezia, dysuria, hematuria, rash, seizure activity, orthopnea, PND, pedal edema, claudication. Remaining systems are negative.  Physical Exam: Well-developed well-nourished in no acute distress.  Skin is warm and dry.  HEENT is normal.  Neck is supple.  Chest dry crackles right greater than left Cardiovascular exam is regular rate and rhythm.  Abdominal exam nontender or distended. No masses palpated. Extremities show no edema. neuro grossly intact   A/P  1 coronary artery disease-minimal nonobstructive disease noted on prior CTA.  Continue medical therapy with aspirin and statin.  2 hyperlipidemia-continue statin.  3 dyspnea-this is felt predominant secondary to interstitial lung disease which is followed by pulmonary.  4 history of palpitations-no recent symptoms.  She did not tolerate metoprolol.  Will follow.  [name], MD

## 2023-02-21 ENCOUNTER — Encounter: Payer: Self-pay | Admitting: Internal Medicine

## 2023-02-21 ENCOUNTER — Ambulatory Visit: Payer: Medicare Other | Admitting: Internal Medicine

## 2023-02-21 VITALS — BP 123/77 | HR 68 | Ht 66.0 in | Wt 139.6 lb

## 2023-02-21 DIAGNOSIS — R002 Palpitations: Secondary | ICD-10-CM | POA: Diagnosis not present

## 2023-02-21 DIAGNOSIS — J84112 Idiopathic pulmonary fibrosis: Secondary | ICD-10-CM | POA: Diagnosis not present

## 2023-02-21 DIAGNOSIS — R0902 Hypoxemia: Secondary | ICD-10-CM

## 2023-02-21 NOTE — Addendum Note (Signed)
Addended by: Glynda Jaeger on: 02/21/2023 03:06 PM   Modules accepted: Orders

## 2023-02-21 NOTE — Patient Instructions (Addendum)
ICD-10-CM   1. IPF (idiopathic pulmonary fibrosis) (HCC)  J84.112     2. Exercise hypoxemia  R09.02     3. Palpitation  R00.2        #Idiopathic pulmonary fibrosis EXercise hypoxemia JAn 2025  -  Super concerned diasese is progressive  Plan   - Preferred Option A  - start Ofev which you have never tried but understand cost and side effect concern -Preferred option B  - -Take consent form for study called [employer] -[employer]  -And then once you are stable on this study drug in a few months you can start nintedanib if you wish  - Option C [least recommended]  -Do nothing but just continue supportive care   #Exercise hypoxemia  Plan  - Start 2 L nasal cannula portable oxygen system -Test for overnight pulse oximetry desaturations    #Palpitations with exercise [2 episodes Sept 2024 and July 2024] - 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   -No evidence of pulmonary hypertension on the echocardiogram August 2024 -Could also be because of desaturations with exertions that you are having PSSVT Plan Per DR Jens Som  - see if oxygen Rx helps  Follow-up - 6 weeks video visit with Dr. Marchelle Gearing or nurse practitioner to catch up on the status

## 2023-02-21 NOTE — Progress Notes (Signed)
IOV 12/04/2017: Dr Delton Coombes: [name] is a 83 year old never smoker with a history of allergic rhinitis, GERD, hypothyroidism, MVP.  She is been seen in our office in the past by Dr. Shelle Iron for restless leg syndrome-she still treated for this with Requip. On allegra, nexium prn.   She is referred today for dyspnea.  She reports that she has had an exercise routine where she walked w her husband 2 miles, exercises at the [facility], for several years. She has noticed more SOB, especially with mild hills. Sometimes now has to stop briefly to complete the walk. Occasional chest tightness, no wheeze. No overt CP. She reports daily cough, often in the am, sometimes clear mucous. She is on allegra. She had a walking stress test 09/2014 > reassuring ECG w exercise. Weight has been stable. Her last TSH was 06/25/17, 0.73.   Review of her notes shows hx possible GGI on prior CT 2014.   ROV 02/26/18 [name] --83 year old never smoker with a history of mitral valve prolapse whom I saw earlier this year for exertional dyspnea and chronic cough.  Pulmonary function testing consistent with restriction.  She has GERD and allergic rhinitis both of which could be contributing to her chronic cough.  I performed a high-resolution CT scan of the chest on 1/20 and reviewed today, this shows some patchy confluent subpleural reticular disease and groundglass attenuation with some minimal traction bronchiectasis and no frank honeycomb change.  This reflects a progression compared with 2014 and is an NSIP pattern.  Walking oximetry at her last visit did not show any exertional desaturation. She is still coughing, is using fexofenadine, takes nexium qod.   [place], had pesticide exposure.   ROV 08/26/2018 [name] --follow-up visit for 84 year old woman with a history of mitral valve prolapse, chronic cough, dyspnea with restrictive lung disease noted on pulmonary function testing.  High Res CT scan of the chest  shows some mild interstitial disease.  She was also having cough - better with flonase; still on nexium, allegra. Her exertional tolerance is improved, she has been exercising more. She remains on Requip for RLS.   We performed autoimmune labs last time, ANA positive at low titer (1: 80), SSA and SSB negative, RNP negative, RF negative, CCP negative, Anti-Smith negative, anti-SCL negative, DS DNA negative, anti-Jo negative, aldolase negative. She hasn't been seen in ILD clinic yet.    OV 10/08/2018  Subjective:  Patient ID: Elizabeth Espinoza, female , DOB: 1940/02/17 , age 40 y.o. , MRN: [id number] , ADDRESS: [address] [place] [address]   10/08/2018 -   Chief Complaint  Patient presents with   Interstitial Lung Disease    Breathing the same as it was during August 2020 office visit with Dr. Delton Coombes     HPI Elizabeth Espinoza 83 y.o. -has been referred to the interstitial lung disease clinic because of findings of interstitial lung disease.  History is gathered from talking to her, review of Dr. Leslye Peer notes and also the integrated ILD questionnaire.  Briefly, she tells me that she was working out at [employer] and would just notice occasional dyspnea but she really did not compare it with other people.  Then in October 2019 she started walking 3 miles daily except on the days it rains with a husband.  During this time she noticed that she was falling really behind because of shortness of breath and exertional fatigue.  This resulted  in subsequent evaluation all documented above.  Findings of interstitial lung disease with subpleural reticulation suggestive of an alternative diagnosis.  Autoimmune profile essentially negative other than trace positive ANA.  She tells me that her significant major problem is exertional dyspnea when walking stairs or walking several miles.  She did not desaturate in our office several months ago.  She does not know if she desaturates when she exerts  walking 3 miles.  Pulmonary function test earlier this year is just isolated low DLCO.  She also has like a cough.  Overall since the onset of the symptoms by exercising herself more and conditioning she is somewhat better.  She did see Dr. [name] in May 2020 and in June 2020 had a coronary calcium CT which appears to have no calcium deposits.   [facility] Integrated Comprehensive ILD Questionnaire  Symptoms:  -Dyspnea started suddenly and since it started it is better.  She says it is been present for years although she did tell me that it is only there since October 2019 when she noticed that.  Severity is listed below.  She does have a cough almost 1 year.  Since it started it is better.  It is mostly in the morning.  She does bring up some phlegm.  Early on in the morning it is green or yellow.  Since it started it is the same/better.  There is no wheezing.  She does have some chest tightness with this when she walks.  It is relieved by rest.  Cardiac work-up in June 2020 showed no calcium deposits.   Past Medical History : Positive for chronic longstanding acid reflux disease and thyroid disease..  In addition CT scan from January 2020 shows hiatal hernia that is small..  This presence of  sclerosis in the bony structures in January 2020.-Primary care physician has been sent a message today.  There is no asthma or COPD or heart failure rheumatoid arthritis or collagen vascular disease.  She does have GERD and hiatal hernia for several years to decades.  No sleep apnea.  No blood clots.  No hepatitis.  No tuberculosis.  No pleurisy.  , ANA positive at low titer (1: 80), SSA and SSB negative, RNP negative, RF negative, CCP negative, Anti-Smith negative, anti-SCL negative, DS DNA negative, anti-Jo negative, aldolase negative. She hasn't been seen in ILD clinic yet.    ROS:  -She does have fatigue for the last several years.  She does have some back and hip issues.  She does have dry eyes.  She  does have like some dysphagia.  There is presence of hiatal hernia-there is a small.  Acid reflux for several decades.  She also reports presence of nonspecific rash   FAMILY HISTORY of LUNG DISEASE: * -Her father died of mesothelioma in 29 83/1984 at the age of 87 otherwise no lung disease.   EXPOSURE HISTORY:   -When she was 17 she smokes cigarettes but otherwise no cigarette or tobacco use or electronic cigarette.  Never smoked marijuana.  No cocaine use no intravenous drug use.   HOME and HOBBY DETAILS :  -Single[place]family home [place] setting for the last 16 years in a 82 year old home.  No mold or mildew exposure in the [facility] duct or CPAP mask or humidifier.  No mold or mildew in the bathroom.  No pet birds in the house.  No misting [place].  No feather pillows no feather duvet.  No musical instruments.  She does some occasional gardening which  she likes.  She does do some fine-needle work.   OCCUPATIONAL HISTORY (122 questions) :  = Essentially negative except for the fact when she was a child she did some tobacco growing.  She has done home gardening for 50 years.    PULMONARY TOXICITY HISTORY (27 items):  denies  Results for JANISA, LABUS (MRN [id number]) as of 11/07/2018 11:20  Ref. Range 02/26/2018 12:17  Anti Nuclear Antibody (ANA) Latest Ref Range: NEGATIVE  POSITIVE (A)  ANA Pattern 1 Unknown Nuclear, Speckled (A)  ANA Titer 1 Latest Units: titer 1:80 (H)  Anti JO-1 Latest Ref Range: 0.0 - 0.9 AI <0.2  Cyclic Citrullin Peptide Ab Latest Units: UNITS <16  ds DNA Ab Latest Units: IU/mL <1  RA Latex Turbid. Latest Ref Range: <14 IU/mL <14  ENA SM Ab Ser-aCnc Latest Ref Range: <1.0 NEG AI <1.0 NEG  Ribonucleic Protein(ENA) Antibody, IgG Latest Ref Range: <1.0 NEG AI <1.0 NEG  SSA (Ro) (ENA) Antibody, IgG Latest Ref Range: <1.0 NEG AI <1.0 NEG  SSB (La) (ENA) Antibody, IgG Latest Ref Range: <1.0 NEG AI <1.0 NEG  Scleroderma (Scl-70) (ENA) Antibody, IgG Latest Ref Range: <1.0  NEG AI <1.0 NEG   Results for PRUDENCE, HEINY (MRN [id number]) as of 11/07/2018 11:20  Ref. Range 01/29/2018 14:31  FVC-Pre Latest Units: L 2.97  FVC-%Pred-Pre Latest Units: % 98  Results for CHARLE, MCLAURIN (MRN [id number]) as of 11/07/2018 11:20  Ref. Range 01/29/2018 14:31  DLCO unc Latest Units: ml/min/mmHg 14.80  DLCO unc % pred Latest Units: % 54    OV 11/07/2018  Subjective:  Patient ID: Elizabeth Espinoza, female , DOB: 1940/08/28 , age 5 y.o. , MRN: [id number] , ADDRESS: [address] [place] [address]   11/07/2018 -   Chief Complaint  Patient presents with   Follow-up    Patient reports that she has sob with any exertion.    Follow-up interstitial lung disease  HPI Elizabeth Espinoza 83 y.o. -last seen September 2020.  After that she was supposed to have follow-up high-resolution CT chest and spirometry DLCO.  For some reason the spirometry DLCO is not done.  In the interim her symptoms remain the same as shown by the symptom score below.  She does note when she does heavy exertion such as climbing stairs or long uphill walks her pulse ox drops to 84% but quickly regains.  She is not interested in portable oxygen.  She had a repeat high-resolution CT chest in September 2020 and when compared to January 2020 there is no significant change.  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  I personally visualized the film.  There is diffuse bilateral subpleural reticulation and some traction bronchiectasis.  I myself would say it may be this is indeterminate for UIP.  But there is some minimal air trapping and she is done some gardening work.  There is no upper zonal predominance that would fit in with hypersensitive pneumonitis.  She has incidental findings of bony sclerosis that is again repeated in the CT scan.  Her primary care physician Cheryll Cockayne evaluated her.  I reviewed the note.  She is been  referred to Dr. Jeanie Sewer in hematology who she is seeing November 12, 2018.  She is very confused about the fact that she has a bony sclerosis problem for which she is being referred to a blood doctor [hematologist] and she thought she is  having a blood test that was ordered by me.  I reviewed the chart and clarified this concepts   Ct Chest High Resolution  Result Date: 11/05/2018 CLINICAL DATA:  83 year old female with history of interstitial lung disease. Increased shortness of breath and cough over the past year. EXAM: CT CHEST WITHOUT CONTRAST TECHNIQUE: Multidetector CT imaging of the chest was performed following the standard protocol without intravenous contrast. High resolution imaging of the lungs, as well as inspiratory and expiratory imaging, was performed. COMPARISON:  High-resolution chest CT 02/10/2018. FINDINGS: Cardiovascular: Heart size is normal. There is no significant pericardial fluid, thickening or pericardial calcification. There is aortic atherosclerosis, as well as atherosclerosis of the great vessels of the mediastinum and the coronary arteries, including calcified atherosclerotic plaque in the left anterior descending and right coronary arteries. Mild calcifications of the mitral annulus. Mediastinum/Nodes: No pathologically enlarged mediastinal or hilar lymph nodes. Please note that accurate exclusion of hilar adenopathy is limited on noncontrast CT scans. Esophagus is unremarkable in appearance. No axillary lymphadenopathy. Lungs/Pleura: High-resolution images demonstrates some patchy areas of peripheral predominant septal thickening and subpleural reticulation, with mild cylindrical bronchiectasis and peripheral bronchiolectasis. No frank honeycombing confidently identified at this time. These findings have no definitive craniocaudal gradient. In the periphery of the mid to upper lungs there also some plaque-like areas of apparent pleuroparenchymal scarring and volume loss.  Inspiratory and expiratory imaging demonstrates minimal air trapping indicative of very mild small airways disease. Overall, these imaging findings appear stable compared to the prior study. Upper Abdomen: Aortic atherosclerosis. Musculoskeletal: Mild diffuse sclerosis throughout the visualized axial and appendicular skeleton, similar to prior examinations. There are no definite focal aggressive appearing lytic or blastic lesions noted in the visualized portions of the skeleton. IMPRESSION: 1. 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 2. Aortic atherosclerosis, in addition to 2 vessel coronary artery disease. Assessment for potential risk factor modification, dietary therapy or pharmacologic therapy may be warranted, if clinically indicated. 3. Persistent mild diffuse sclerosis throughout the visualized osseous structures without discrete aggressive appearing osseous lesions. Clinical correlation for signs and symptoms of potential infiltrative process such as myelofibrosis is suggested. Aortic Atherosclerosis (ICD10-I70.0). Electronically Signed   By: Trudie Reed M.D.   On: 11/05/2018 14:36     OV 12/22/2018  Subjective:  Patient ID: Elizabeth Espinoza, female , DOB: [birth year] , age 54 y.o. , MRN: [id number] , ADDRESS: [address] [place] [address]   12/22/2018 -   Chief Complaint  Patient presents with   Follow-up    Pt states she has been doing good since last visit. Pt is still coughing and will get up clear mucus in the morning.   Follow-up interstitial lung disease with CT scan October 2020 being indeterminate versus alternate diagnosis.  History of gardening.  Trace  autoimmune ANA positive.  Mild progression since 2014  HPI Elizabeth Espinoza 83 y.o. -returns for follow-up.  She presents with her husband who I am meeting for the first time.  In the interim she met Dr. Dorris Fetch thoracic surgeon for surgical lung biopsy.  Her husband also met with him.  I reviewed the note.  She tells me that given the morbidity with surgical lung biopsy she wants to  undergo bronchoscopy with lavage and transbronchial biopsy first.  She understands the inherent limitations of these procedure in terms of diagnosis.  But she wants to take the lower risk profile.  Overall she feels stable.  Her symptom score is improved compared to the past.  Her walking desaturation test shows exaggerated drop in pulse ox but this was done with her wearing the mask.  She did not feel any dyspnea.  In terms of her bony sclerosis she has seen Dr. Leonides Schanz at [facility].  I reviewed the note.  He has reassured her.  Risks of pneumothorax, hemothorax, sedation/anesthesia complications such as cardiac or respiratory arrest or hypotension, stroke and bleeding all explained. Benefits of diagnosis but limitations of non-diagnosis also explained. Patient verbalized understanding and wished to proceed.   They want to have the bronchoscopy after the holidays of Christmas and new year.  This is because their house is undergoing remodeling currently.       OV 03/16/2019  Subjective:  Patient ID: Elizabeth Espinoza, female , DOB: 1940-03-07 , age 46 y.o. , MRN: [id number] , ADDRESS: [address] [place] [address]   03/16/2019 -   Chief Complaint  Patient presents with   Follow-up    PFT performed today.  Pt states she has been doing okay since last visit and states her breathing is about the same.     Finally able to review esults of envisia send out test to [place]. Date of test is 02/05/2019. 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 -returns for follow-up to discuss  bronchoscopy and lavage results.  In the interim no new respiratory issues.  They are all stable.  However she is having significant musculoskeletal issues with shoulder pain.  Serology from a year ago was negative.  They wanted to know about relatedness.  I told him it is probably not related.  We went over the bronchoscopy lavage results which showed some dominance of neutrophils consistent with UIP.  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   We had a long discussion about the benefits, risks and limitations of antifibrotic therapy.  We also discussed the choice between pirfenidone and nintedanib.       Results for SANTA, ABDELRAHMAN (MRN [id number]) as of 03/16/2019 11:12  Ref. Range 01/29/2018 14:31 03/16/2019 08:42  FVC-Pre Latest Units: L 2.97 2.91  FVC-%Pred-Pre Latest Units: % 98 97   Results for LIZETH, BENCOSME (MRN [id number]) as of 03/16/2019 11:12  Ref. Range 01/29/2018 14:31 03/16/2019 08:42  DLCO unc Latest Units: ml/min/mmHg 14.80 12.32  DLCO unc % pred Latest Units: % 54 60     OV 06/11/2019 - telephine visit. Patient identified with 2 pHI, risks, benefit, limitations of tele visit explained  Subjective:  Patient ID: Elizabeth Espinoza, female , DOB: 12/23/1940 , age 65 y.o. , MRN: [id number] , ADDRESS: [address] [place] [address] ate of test is 02/05/2019. 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 -in this telephone visit patient is now on pirfenidone.  She says she is on pirfenidone since mid April 2021.  She is on 3 pills 3 times a day.  She spacing them  4 hours apart but having some intermittent nausea every few days.  She did have an urticaria but that was before she started pirfenidone was related to Covid vaccine  that is now resolved.  She is asking about exercising at the [facility] and Covid precautions.  I advised her because she is vaccinated that the risk is low but not 0 and to take adequate precautions as tolerated.  She had liver function test and this is normal.  Her next appointment for pulmonary function test is in mid July 2021.  Recommended she make a face-to-face visit with me at that time.   OV 08/21/2019   Subjective:  Patient ID: Elizabeth Espinoza, female , DOB: Oct 30, 1940, age 23 y.o. years. , MRN: [id number],  ADDRESS: [address] [place] [address] PCP  Pincus Sanes, MD Providers : Treatment Team:  Attending Provider: Kalman Shan, MD  Type of visit: Telephone Circumstance: COVID-19 national emergency Identification of patient Elizabeth Espinoza - 2 person identifier Risks: Risks, benefits, limitations of telephone visit explained Patient location: home This provider location: [place] [facility]      Chief Complaint  Patient presents with   Follow-up    PFT 7/27--c/o sob with stairs and throat clearing mainly in the morning. stopped Esbriet on 08/12/2019.   Follow-up interstitial lung disease with CT scan October 2020 being indeterminate versus alternate diagnosis.  History of gardening.  Trace autoimmune ANA positive.  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 -presents for this telephone visit for IPF.  On this telephone visit she was identified with 2 person identifier.  Risks, benefits and limitations of telephone visit explained.  After last visit her GI symptoms worsen.  She did see Dr. Camillia Herter gastroenterologist.  She underwent endoscopy.  Dr. Loreta Ave did send the results to me I do not have this.  I remember Dr. Loreta Ave calling me and discussing  patient's GI side effects.  These were from pirfenidone so on August 12, 2019 based on my advise she start pirfenidone.  Since then her symptoms of GI nature have resolved.  Her respiratory symptoms continue to be stable.  These are all documented below.  At this point in time she feels that her quality of life is very important.  She does not want to go through the kind of GI side effect she went through with pirfenidone.  She would rather deal with the disease.  She is aware that nintedanib is a standard of care therapeutic option.  She is also aware after discussion the clinical trials as a care option particularly in the past to discovering new therapies that do not have this GI side effects that pirfenidone poses.  She had a lot of questions about Covid vaccine.  She is fully vaccinated.  She is worried about her grandson was refusing the vaccine.      OV 09/29/2019   Subjective:  Patient ID: Elizabeth Espinoza, female , DOB: 1940/02/07, age 35 y.o. years. , MRN: [id number],  ADDRESS: [address] [place] [address] PCP  Pincus Sanes, MD Providers : Treatment Team:  Attending Provider: Kalman Shan, MD   Chief Complaint  Patient presents with   Follow-up    LFT today?  pulmonary rehab or exercises.  inhaler trial, felt better after Advair.   Follow-up interstitial lung disease/IPF with CT scan October 2020 being indeterminate versus alternate diagnosis.  History of gardening.  Trace autoimmune ANA positive.  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 Date of test is 02/05/2019. 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  -Date of giving diagnosis is March 16, 2019  - MDD 05/05/19  - esbriet since 05/06/19- stopped 08/12/19 due to severe GI side effect    HPI Elizabeth Espinoza 83 y.o. -    presesnts  With her husband for IPF followup.  Overall she is doing  well.  She is not taking pirfenidone since July 2021.  After that she has gained weight.  All her GI symptoms have resolved.  She goes for daily walks.  She says when she reaches the top of the hill sometimes she desaturates to 88% but not always.  She monitors this closely.  She is asking for other exercises that can improve her endurance and lung function.  We discussed that exercises do not improve lung function.  She is willing to participate in pulmonary rehabilitation.  We discussed the alternative of taking nintedanib but because of the severe GI side effects that she had with Esbriet she is nervous about starting nintedanib and does not want to.  I introduced her to the concept of clinical trials as a care option.  At this point in time she wants to process this information.  We did discuss the fact that IPF is a progressive disease and antifibrotic's are indicated early in the course of the disease.  She prefers to have supportive care approach.  She wants to focus on strengthening her overall physical shape.   OV 06/30/2021 -standard of care visit in this research patient.  Subjective:  Patient ID: Elizabeth Espinoza, female , DOB: Sep 06, 1940 , age 56 y.o. , MRN: [id number] , ADDRESS: [address] [place] [address] PCP Pincus Sanes, MD Patient Care Team: Pincus Sanes, MD as PCP - General (Internal Medicine) Jens Som Madolyn Frieze, MD as Consulting Physician (Cardiology) Leslye Peer, MD as Consulting Physician (Pulmonary Disease) Kathyrn Sheriff, [facility] as Pharmacist (Pharmacist) Christia Reading, MD as Consulting Physician (Otolaryngology) [facility] as Consulting Physician (Ophthalmology)  This Provider for this visit: Treatment Team:  Attending Provider: Kalman Shan, MD    06/30/2021 -   Chief Complaint  Patient presents with   Follow-up    Pt recently had a CT of her sinuses and is here to discuss the results.  Pt states she has not been  feeling well and states she is still having problems with her sinuses. States her breathing has been doing okay.     HPI Elizabeth Espinoza 83 y.o. -on April 19, 2021 she did have some side effects from inhaled medical product.  After that we reduce the dose and slowly escalated back and then she was tolerating it fine.  On 05/22/2021 she was prescribed antibiotics for some sinus issues she did have week 8 study visit on 06/01/2021 and was doing okay.  But then on 06/20/2021 she started complaining of headache and sinus congestion and coughing and blowing yellowish mucus.  At this point in time she was taking inhaled treprostinil versus placebo investigational medical product at 4 times daily each time 12 puffs.  We told  her to reduce it to 9 puffs 4 times daily.  She tells me that perhaps she is marginally better after reducing the dose but she still having headaches.  She feels like her head is full particularly in the top part.  She also has significant cough.  She says during and after she takes the inhaled medical investigational product her cough score is 9 out of 10.  Rest of the day it is around 3 out of 10.  She coughs so much that she sometimes brings out mild yellow mucus that is like glue.  There is no diarrhea fatigue or weight loss or syncope or hemoptysis.  Definitely all the symptoms only after starting the research protocol.  She also some sore throat.  She has tried some remedial measures but these have not helped.  These include yogurt and ginger ale  For the dyspnea itself she feels stable.  She also tells me that she is able to walk a mile.  She is able to outplace her husband.  She feels her husband's declining health.  She says when she walks a mile she puts a finger probe after she rests and the pulse ox is 94%.  She is worried she might desaturate below 88%.  She does not have a forehead pulse oximeter.  She says the finger pulse ox meter does not stay on while she is walking.  I gave her a  contact of the patient support group leader to figure out a way to get a forehead probe.  Also showed her various finger proximal she can get that can stay on her finger while she exercises.  Told her the safety limit is greater than 88%.  Symptom scores below show that her symptoms are better than 2 years ago slightly.  This is from a respiratory standpoint with a cough is definitely worse after she went on the study.  And the headache is new.  She had a CT scan sinus that shows chronic findings.      OV 05/04/2022  Subjective:  Patient ID: Elizabeth Espinoza, female , DOB: July 29, 1940 , age 42 y.o. , MRN: [id number] , ADDRESS: [address] [place] [address] PCP Pincus Sanes, MD Patient Care Team: Pincus Sanes, MD as PCP - General (Internal Medicine) Jens Som Madolyn Frieze, MD as Consulting Physician (Cardiology) Leslye Peer, MD as Consulting Physician (Pulmonary Disease) Kathyrn Sheriff, [facility] as Pharmacist (Pharmacist) Christia Reading, MD as Consulting Physician (Otolaryngology) [facility] as Consulting Physician (Ophthalmology)  This Provider for this visit: Treatment Team:  Attending Provider: Kalman Shan, MD  05/04/2022 -   Chief Complaint  Patient presents with   Follow-up    F/u after researched      HPI Elizabeth Espinoza 83 y.o. -returns for routine follow-up.  In the interim she was on inhaled treprostinil versus placebo but she did not tolerate it at all.  She had all the classic side effects of the renal drug.  We made multiple attempts in holiday and giving the drug back but she did not tolerate it at all and she finally quit.  She is out of the study now.  She did do 1 year follow-up and exited the study.  Currently she is on supportive care.  She walks on Monday through Friday 3 miles a day.  This is through [facility] and she has gotten the best walker award.  She does have some chest burning when she starts the  walking but it  does get better as she walks further.  She is not limited by dyspnea.  She checks her pulse ox after the walk a few seconds later and the pulse ox is 93% 94% at the lowest.  I did tell her to monitor it while she walks.  In terms with therapy she is not interested in standard of care antifibrotic therapy because of side effect profile.  She tried pirfenidone in the past.  She is also been intolerant to treprostinil.  She is absolutely not interested in nintedanib.  We talked about doing the revert study sponsored by [facility].  Is an oral pill.  She read the consent form which we gave it to her some weeks ago.  She does not want to do the study because of side effect profile.  There is another study called MOONSCAPE by [place].  Is an injection study.  Current patients are tolerating the study well so far.  She might be interested in this..  Will probably aim to recruit and Q3  2024.    OV 09/17/2022  Subjective:  Patient ID: Elizabeth Espinoza, female , DOB: [birth year] , age 52 y.o. , MRN: [id number] , ADDRESS: [address] [place] [address] PCP Pincus Sanes, MD Patient Care Team: Pincus Sanes, MD as PCP - General (Internal Medicine) Jens Som Madolyn Frieze, MD as PCP - Cardiology (Cardiology) Jens Som Madolyn Frieze, MD as Consulting Physician (Cardiology) Leslye Peer, MD as Consulting Physician (Pulmonary Disease) Kathyrn Sheriff, [facility] (Inactive) as Pharmacist (Pharmacist) Christia Reading, MD as Consulting Physician (Otolaryngology) [facility] as Consulting Physician (Ophthalmology)  This Provider for this visit: Treatment Team:  Attending Provider: Kalman Shan, MD    09/17/2022 -   Chief Complaint  Patient presents with   Follow-up    Breathing is about the same. She denies any new co's. She is walking 3 miles daily.    HPI Elizabeth Espinoza 83 y.o. -presents for follow-up.  She is currently on supportive care.  She started off the visit by saying  she was doing stable but later she did admit that when she does a 3 mile walk sometimes she gets dizziness but she is not checking her pulse ox but she is still able to finish her 3 miles at the same time.  She also when she does vacuuming she gets quite dyspneic.  No new medical problems other than the fact her leg and feet have chronic numbness and associated with restless legs. Interim Health status: No new complaints No new medical problems. No new surgeries. No ER visits. No Urgent care visits. No changes to medications.  Symptom scores and x-rays hypoxemia test remained stable but a pulmonary function test today shows a drop in DLCO.  We discussed this.  In terms of treatment she is only on supportive care after having failed antifibrotic's and previous intolerance to inhaled treprostinil study drug.  She is looking at the injection based therapy via [employer] protocol.       OV 10/23/2022  Subjective:  Patient ID: Elizabeth Espinoza, female , DOB: 14-Nov-1940 , age 59 y.o. , MRN: [id number] , ADDRESS: [address] [place] [address] PCP Pincus Sanes, MD Patient Care Team: Pincus Sanes, MD as PCP - General (Internal Medicine) Jens Som Madolyn Frieze, MD as PCP - Cardiology (Cardiology) Jens Som Madolyn Frieze, MD as Consulting Physician (Cardiology) Leslye Peer, MD as Consulting Physician (Pulmonary Disease) Kathyrn Sheriff, [facility] (  Inactive) as Pharmacist (Pharmacist) Christia Reading, MD as Consulting Physician (Otolaryngology) [facility] as Consulting Physician (Ophthalmology)  This Provider for this visit: Treatment Team:  Attending Provider: Kalman Shan, MD    10/23/2022 -   Chief Complaint  Patient presents with   Follow-up    Ct scan f/u      HPI Elizabeth Espinoza 83 y.o. -returns for follow-up.  She continues a 3 mile walks.  She feels that the exertional shortness of breath that she was having during the 3 mile walk is somewhat better.   Reviewing of the symptom score tells me that she has been stable overall and it is hard to discern.  The big concern at last visit was a drop in pulmonary function test especially DLCO.  There was concern about pulmonary hypertension versus pulmonary fibrosis worsening so so we did BNP and this was elevated increasing concern for pulmonary hypertension but the echo shows only grade 1 diastolic dysfunction normal RV.  Were also concerned about worsening pulmonary fibrosis we did a high-resolution CT chest and radiology feels it has been stable for 2 years.  Stability in lung function is confirmed by stability and symptom score and also exercise hypoxemia test.  The big change she has there is a pulmonary function test.  This could be a red herring based on variations.  Nevertheless the BNP was high.  Therefore we will recheck this today.  She did mention to me that 7 or 10 days ago she did have an episode of palpitations while doing a 3 mile walk heart rate was 170 on the Fitbit and she had to stop.  No known atrial fibrillation diagnosis.  Same thing happened 2 months ago but she forgot to mention this to Dr. Jens Som on her visit early August 2024.  I have reached out to him via electronic message.  In terms of her pulmonary fibrosis therapy she is interested in other therapies that have minimal side effect profile.  The approved and antifibrotic's all have side effect profile.  She is interested experimental protocol.  We went over VIXARELIMAB monoclonal antibody given every few weeks.  There are some interesting data with prurigo.  I showed her that in the side effect profile.  She is taken the consent form.  She is interested in this at this point.     CT Chest data from date: 09/20/22  Narrative & Impression  CLINICAL DATA:  83 year old female with history of interstitial lung disease. Follow-up study.   EXAM: CT CHEST WITHOUT CONTRAST   TECHNIQUE: Multidetector CT imaging of the chest was  performed following the standard protocol without intravenous contrast. High resolution imaging of the lungs, as well as inspiratory and expiratory imaging, was performed.   RADIATION DOSE REDUCTION: This exam was performed according to the departmental dose-optimization program which includes automated exposure control, adjustment of the mA and/or kV according to patient size and/or use of iterative reconstruction technique.   COMPARISON:  High-resolution chest CT 11/14/2020.   FINDINGS: Cardiovascular: Heart size is normal. There is no significant pericardial fluid, thickening or pericardial calcification. There is aortic atherosclerosis, as well as atherosclerosis of the great vessels of the mediastinum and the coronary arteries, including calcified atherosclerotic plaque in the left anterior descending and right coronary arteries. Mild calcification of the mitral annulus.   Mediastinum/Nodes: No pathologically enlarged mediastinal or hilar lymph nodes. Please note that accurate exclusion of hilar adenopathy is limited on noncontrast CT scans. Esophagus is unremarkable in  appearance. No axillary lymphadenopathy.   Lungs/Pleura: Widespread areas of peripheral predominant septal thickening, subpleural reticulation, scattered mild cylindrical bronchiectasis and peripheral bronchiolectasis are again noted throughout the lungs bilaterally. No definite honeycombing noted. No discernible craniocaudal gradient. Overall, these findings appear essentially unchanged compared to the prior study. Inspiratory and expiratory imaging is unremarkable. No acute consolidative airspace disease. No definite suspicious appearing pulmonary nodules or masses are noted. Extensive bilateral apical pleuroparenchymal thickening and architectural distortion, similar to the prior study.   Upper Abdomen: Aortic atherosclerosis.   Musculoskeletal: There are no aggressive appearing lytic or blastic lesions  noted in the visualized portions of the skeleton.   IMPRESSION: 1. 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 Given the long-term stability, an alternative diagnosis such as fibrotic phase nonspecific interstitial pneumonia (NSIP) should also be considered. 2. Aortic atherosclerosis, in addition to left anterior descending and right coronary artery disease. Please note that although the presence of coronary artery calcium documents the presence of coronary artery disease, the severity of this disease and any potential stenosis cannot be assessed on this non-gated CT examination. Assessment for potential risk factor modification, dietary therapy or pharmacologic therapy may be warranted, if clinically indicated.   Aortic Atherosclerosis (ICD10-I70.0).     Electronically Signed   By: Trudie Reed M.D.   On: 09/29/2022 12:00      Latest Reference Range & Units 09/17/22 12:05  Pro B Natriuretic peptide (BNP) 0.0 - 100.0 pg/mL 129.0 (H)  (H): Data is abnormally high   ECHO 09/18/22   IMPRESSIONS     1. Left ventricular ejection fraction, by estimation, is 55 to 60%. The  left ventricle has normal function. The left ventricle has no regional  wall motion abnormalities. Left ventricular diastolic parameters are  consistent with Grade I diastolic  dysfunction (impaired relaxation).   2. Right ventricular systolic function is normal. The right ventricular  size is normal.   3. Left atrial size was mildly dilated.   4. The mitral valve is normal in structure. Trivial mitral valve  regurgitation. No evidence of mitral stenosis.   5. The aortic valve is tricuspid. There is mild calcification of the  aortic valve. Aortic valve regurgitation is trivial. Aortic valve  sclerosis/calcification is present, without any evidence of aortic  stenosis.   6. The  inferior vena cava is normal in size with greater than 50%  respiratory variability, suggesting right atrial pressure of 3 mmHg.     OV 02/21/2023  Subjective:  Patient ID: Elizabeth Espinoza, female , DOB: 17-Feb-1940 , age 84 y.o. , MRN: [id number] , ADDRESS: [address] [place] [address] PCP Pincus Sanes, MD Patient Care Team: Pincus Sanes, MD as PCP - General (Internal Medicine) Jens Som Madolyn Frieze, MD as PCP - Cardiology (Cardiology) Jens Som Madolyn Frieze, MD as Consulting Physician (Cardiology) Leslye Peer, MD as Consulting Physician (Pulmonary Disease) Kathyrn Sheriff, [facility] (Inactive) as Pharmacist (Pharmacist) Christia Reading, MD as Consulting Physician (Otolaryngology) [facility] as Consulting Physician (Ophthalmology)  This Provider for this visit: Treatment Team:  Attending Provider: Kalman Shan, MD    02/21/2023 -   Chief Complaint  Patient presents with   Follow-up    Pt states she still has a ongoing productive cough ( green phlegm ) states she is feeling stronger and less fatigue.      Follow-up interstitial lung disease/IPF  with CT scan October 2020 being indeterminate versus alternate diagnosis.  History of gardening.  Trace autoimmune ANA positive.  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 Date of test is 02/05/2019. 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  -Date of giving diagnosis is March 16, 2019  - MDD 05/05/19  - esbriet since 05/06/19- stopped 08/12/19 due to severe GI side effect   IPF patient on  -No approved antifibrotic based on personal choice and side effect profile -On inhaled treprostinil versus placebo [[place]] -phase 3 trial  -Consent February 2023  -Randomization April 06 2021 -> QUIT early due to side effects (presumably got real drug) -> completed 1 year follow-up spring  2024.  HPI Elizabeth Espinoza 83 y.o. -returns for follow-up.  At this follow-up visit she is admitting that she is desaturating with exertion.  She notices when she works out on her exercise bike she goes down to 89 or 88%.  She stopped going to the [facility] because of pulse oximeter is alarming when she gets to 88% and she is doing it easily.  She says at that time she does not necessarily feel short of breath although at later time point she did admit for shortness of breath but overall symptom questionnaire shows continued stability but she is definitely desaturating.  She continues to have tachycardia review of external records indicate she is having SVTs.  Cardiology tried Lopressor but she did not tolerate.  She has a historical tolerance issues with many medications.  She did tolerate the oral pill versus placebo on [employer] study but she did not tolerate standard of care Esbriet.  She did not tolerate inhaled treprostinil versus placebo in a study which we think she got the actual study drug..  Today exercise hypoxemia test shows worsening she is desaturating to 88%.  This something she had not done with similar exertion in the past.  I had a frank conversation with her I did indicate to her that I am really concerned the disease is progressive.  Indicated to her that she should strongly consider trying standard of care nintedanib.  I went over the diarrhea side effects.  She is apprehensive of the side effect but also concerned about the $2000 co-pay cost.  I told her this is standard under the new inflation reduction act.  She is still processing the information.  I told her that the advent of exercise hypoxemia is a negative prognostic sign.  Told her to consider at least option B  which is to get into a clinical trial with injection monoclonal antibody Vixarelimab versus placebo.  So far all patients are tolerating it well.  Short of the side effect profile.  She already took the consent last time but did  not read it.  We are giving her the consent again.  Told her she can add standard of care nintedanib once she starts the study.  Least preferred option is to continue supportive care.   For her quality of life:  - recommend portoabl o2 - test ono     SYMPTOM SCALE - ILD     Inhaled Tyvaso v Placebo. 05/04/2022 Supportive care  09/17/2022 Supporitve care 10/23/2022 Supportive care 02/21/2023 Supportive care  O2 use 10/08/2018  11/07/2018  12/22/2018  06/30/21      Shortness of Breath 0 -> 5 scale with 5 being worst (  score 6 If unable to do)  ra ra ra ra ra  At rest 0 0 0 0 0 0 0  Simple tasks - showers, clothes change, eating, shaving 1 1 0 1 1 1.5 1  Household (dishes, doing bed, laundry) 2 2 2 3 1  for mos stuff to 5 with vaccuming (3) 1.5 (4 vaccuum) 1.5  Shopping 2 2 0.5 2 2 2 3   Walking level at own pace 2 1 0.5 1 1 1  x  Walking up Stairs 4 5 3  2.5 3.5 2.5 3.5  Total (40 - 48) Dyspnea Score 11 11 6  9.5 9.5 8.5 9  How bad is your cough? 1 3 2.5 5 3 2  3.5  How bad is your fatigue 2 2.5 1.5 am, 4.5 pm 4 2 2.5 4.5  nausea    1 0 0 1  vomit    0 00 0 0  diarrhea    0 0 0 0  anxiet    00 0 0 0  depression    0 0 0 0  pain    Headacne,, sinus complaints  0 0   Simple office walk 185 feet x  3 laps goal with forehead probe 06/30/2021  09/17/2022  10/23/2022  02/21/2023  02/21/2023   O2 used ra ra ra ra ra  Number laps completed 3 Sit stand x 10 Sit stand x 15    Comments about pace Fast pace  moderate    Resting Pulse Ox/HR 96% and 74/min 96% and HR 62 97% and HR 75 100% and HR 69 100% n     98% and HR 89 88% and HR 103 96% after 2 LAPS on 2 LNC  Final Pulse Ox/HR 96% and 117/min 94% and HR 77 94% and HR 73    Desaturated </= 88% no no no    Desaturated <= 3% points n no yes    Got Tachycardic >/= 90/min yes no no    Symptoms at end of test No complaints No compalints No complaints    Miscellaneous comments Sx          PFT     Latest Ref Rng & Units 02/18/2023   11:05  AM 09/17/2022   10:11 AM 03/04/2020    1:00 PM 09/15/2019    5:13 PM 03/16/2019    8:42 AM 01/29/2018    2:31 PM  ILD indicators  FVC-Pre L 2.77  P 2.78  3.07  2.92  2.91  2.97   FVC-Predicted Pre % 102  P 103  104  99  97  98   FVC-Post L      2.89   FVC-Predicted Post %      95   DLCO uncorrected ml/min/mmHg 9.27  P 8.80  12.94  12.84  12.32  14.80   DLCO UNC %Pred % 47  P 45  63  63  60  54   DLCO Corrected ml/min/mmHg 9.27  P 8.80  12.94  13.26  12.91    DLCO COR %Pred % 47  P 45  63  65  63      P Preliminary result    Latest Reference Range & Units 09/17/22 12:05 10/23/22 11:36  Pro B Natriuretic peptide (BNP) 0.0 - 100.0 pg/mL 129.0 (H) 113.0 (H)  (H): Data is abnormally high    LAB RESULTS last 96 hours No results found.  LAB RESULTS last 90 days Recent Results (from the past 2160 hours)  Pulmonary function test  Status: None (Preliminary result)   Collection Time: 02/18/23 11:05 AM  Result Value Ref Range   FVC-Pre 2.77 L   FVC-%Pred-Pre 102 %   FEV1-Pre 2.12 L   FEV1-%Pred-Pre 106 %   FEV6-Pre 2.76 L   FEV6-%Pred-Pre 108 %   Pre FEV1/FVC ratio 77 %   FEV1FVC-%Pred-Pre 104 %   Pre FEV6/FVC Ratio 100 %   FEV6FVC-%Pred-Pre 105 %   FEF 25-75 Pre 1.74 L/sec   FEF2575-%Pred-Pre 123 %   DLCO unc 9.27 ml/min/mmHg   DLCO unc % pred 47 %   DLCO cor 9.27 ml/min/mmHg   DLCO cor % pred 47 %   DL/VA 7.82 ml/min/mmHg/L   DL/VA % pred 57 %         has a past medical history of Allergic rhinitis, GERD (gastroesophageal reflux disease), Glaucoma, Hiatal hernia, Hyperlipidemia, Hypothyroidism, ILD (interstitial lung disease) (HCC), Lichen planopilaris, MVP (mitral valve prolapse), Restless legs, and Urticaria.   reports that she has never smoked. She has never used smokeless tobacco.  Past Surgical History:  Procedure Laterality Date   APPENDECTOMY     with TAH   CARDIAC CATHETERIZATION  1989   negative   COLONOSCOPY  2005   Dr Juanda Chance   COLONOSCOPY  01/2013    Dr Loreta Ave   esophageal dilation  07/04/2011   Dr Neita Garnet SIGMOIDOSCOPY  1999    Dr Juanda Chance   KNEE ARTHROSCOPY  2001, 2005   Dr Eulah Pont   bilat   TOTAL ABDOMINAL HYSTERECTOMY     for Endometriosis; no BSO, Dr Arletha Grippe   UPPER GI ENDOSCOPY  01/2013   Dr Loreta Ave   VIDEO BRONCHOSCOPY Bilateral 02/05/2019   Procedure: VIDEO BRONCHOSCOPY WITH FLUORO;  Surgeon: Kalman Shan, MD;  Location[facility] ENDOSCOPY;  Service: Endoscopy;  Laterality: Bilateral;    Allergies  Allergen Reactions   Caffeine     palpitations   Chlorpheniramine-Pseudoeph     palpitations   Maxifed     palpitations   Other Other (See Comments)   Pirfenidone Nausea Only    Esbriet Causes nausea and weakness    Immunization History  Administered Date(s) Administered   Fluad Quad(high Dose 65+) 11/15/2020, 11/14/2021, 11/23/2022   Influenza Split 11/07/2010   Influenza Whole 10/30/2009, 11/27/2011   Influenza, High Dose Seasonal PF 10/27/2012, 11/05/2013, 10/01/2017, 09/11/2018, 11/23/2019   Influenza-Unspecified 11/24/2014, 10/07/2015, 10/08/2016   PFIZER(Purple Top)SARS-COV-2 Vaccination 03/14/2019, 04/06/2019, 10/27/2019   Pneumococcal Conjugate-13 10/07/2015   Pneumococcal Polysaccharide-23 07/02/2017   Respiratory Syncytial Virus Vaccine,Recomb Aduvanted(Arexvy) 12/27/2021   Tdap 02/11/2012   Zoster Recombinant(Shingrix) 10/01/2017, 12/03/2017   Zoster, Live 01/09/2012    Family History  Problem Relation Age of Onset   Lung cancer Father        Asbestos with mesothelioma   Heart disease Paternal Grandmother        ? etiology   Diabetes Paternal Grandmother    Diabetes Brother    Uterine cancer Paternal Aunt    Stroke Paternal Grandfather        > 55   Heart attack Brother 32   Allergies Sister    Aneurysm Mother        congenital   Stroke Brother        two strokes   Barrett's esophagus Brother      Current Outpatient Medications:    aspirin EC 81 MG tablet, Take 1 tablet (81 mg  total) by mouth daily., Disp: 90 tablet, Rfl: 3   cetirizine (ZYRTEC) 10 MG tablet,  Take 10 mg by mouth daily., Disp: , Rfl:    cholecalciferol (VITAMIN D3) 25 MCG (1000 UT) tablet, Take 1,000 Units by mouth daily., Disp: , Rfl:    clobetasol (TEMOVATE) 0.05 % external solution, , Disp: , Rfl:    esomeprazole (NEXIUM) 40 MG capsule, Take 40 mg by mouth every morning., Disp: , Rfl:    famotidine (PEPCID) 40 MG tablet, Take 1 tablet (40 mg total) by mouth daily., Disp: 90 tablet, Rfl: 1   Ferrous Sulfate Dried (SLOW RELEASE IRON) 45 MG TBCR, Take by mouth., Disp: , Rfl:    fluticasone (FLONASE) 50 MCG/ACT nasal spray, Place 1 spray into both nostrils daily as needed for allergies or rhinitis., Disp: , Rfl:    hydroxychloroquine (PLAQUENIL) 200 MG tablet, Take 1 tablet by mouth twice a day, Disp: 60 tablet, Rfl: 9   magnesium oxide (MAG-OX) 400 MG tablet, Take 400 mg by mouth daily., Disp: , Rfl:    Multiple Vitamins-Minerals (ZINC PO), Take 1 tablet by mouth., Disp: , Rfl:    omeprazole (PRILOSEC) 40 MG capsule, Take 40 mg by mouth daily as needed., Disp: , Rfl:    pravastatin (PRAVACHOL) 40 MG tablet, TAKE 1 TABLET BY MOUTH DAILY GENERIC EQUIVALENT FOR PRAVACHOL, Disp: 90 tablet, Rfl: 2   Probiotic Product (PROBIOTIC ADVANCED) CAPS, Take by mouth., Disp: , Rfl:    rOPINIRole (REQUIP) 0.5 MG tablet, TAKE 2 TABLETS BY MOUTH EVERY NIGHT AFTER DINNER, Disp: 180 tablet, Rfl: 1   SYNTHROID 88 MCG tablet, TAKE 1 TABLET DAILY 30 MINUTES BEFORE BREAKFAST, Disp: 90 tablet, Rfl: 3   vitamin B-12 (CYANOCOBALAMIN) 500 MCG tablet, Take 500 mcg by mouth daily., Disp: , Rfl:    metoprolol tartrate (LOPRESSOR) 25 MG tablet, Take 0.5 tablets (12.5 mg total) by mouth 2 (two) times daily., Disp: 60 tablet, Rfl: 3      Objective:   Vitals:   02/21/23 1324  BP: 123/77  Pulse: 68  SpO2: 98%  Weight: 139 lb 9.6 oz (63.3 kg)  Height: 5\' 6"  (1.676 m)    Estimated body mass index is 22.53 kg/m as calculated  from the following:   Height as of this encounter: 5\' 6"  (1.676 m).   Weight as of this encounter: 139 lb 9.6 oz (63.3 kg).  @WEIGHTCHANGE @  [employer]   02/21/23 1324  Weight: 139 lb 9.6 oz (63.3 kg)     Physical Exam   General: No distress. Looks well O2 at rest: no Cane present: no Sitting in wheel chair: no Frail: no Obese: no Neuro: Alert and Oriented x 3. GCS 15. Speech normal Psych: Pleasant Resp:  Barrel Chest - no.  Wheeze - no, Crackles - yes esp right base, No overt respiratory distress CVS: Normal heart sounds. Murmurs - no Ext: Stigmata of Connective Tissue Disease - no HEENT: Normal upper airway. PEERL +. No post nasal drip        Assessment:       ICD-10-CM   1. IPF (idiopathic pulmonary fibrosis) (HCC)  J84.112     2. Exercise hypoxemia  R09.02     3. Palpitation  R00.2          Plan:     Patient Instructions     ICD-10-CM   1. IPF (idiopathic pulmonary fibrosis) (HCC)  J84.112     2. Exercise hypoxemia  R09.02     3. Palpitation  R00.2        #Idiopathic pulmonary fibrosis EXercise hypoxemia JAn 2025  -  Super concerned diasese is progressive  Plan   - Preferred Option A  - start Ofev which you have never tried but understand cost and side effect concern -Preferred option B  - -Take consent form for study called [employer] -[employer]  -And then once you are stable on this study drug in a few months you can start nintedanib if you wish  - Option C [least recommended]  -Do nothing but just continue supportive care   #Exercise hypoxemia  Plan  - Start 2 L nasal cannula portable oxygen system -Test for overnight pulse oximetry desaturations    #Palpitations with exercise [2 episodes Sept 2024 and July 2024] - 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   -No evidence of pulmonary hypertension on the echocardiogram August 2024 -Could also be because of desaturations with exertions that you  are having PSSVT Plan Per DR Jens Som  - see if oxygen Rx helps  Follow-up - 6 weeks video visit with Dr. Marchelle Gearing or nurse practitioner to catch up on the status   FOLLOWUP Return in about 6 weeks (around 04/04/2023) for 15 min visit, with Dr Marchelle Gearing, with any of the APPS, VIDEO VISIT.    SIGNATURE    Dr. Kalman Shan, M.D., F.C.C.P,  Pulmonary and Critical Care Medicine Staff Physician, [facility] Center Director - Interstitial Lung Disease  Program  Pulmonary Fibrosis [facility] at [facility] [place], [place], [address]  Pager: [phone], If no answer or between  15:00h - 7:00h: call [phone] Telephone: [phone]  2:10 PM 02/21/2023

## 2023-03-05 ENCOUNTER — Ambulatory Visit: Payer: Medicare Other | Attending: Cardiology | Admitting: Cardiology

## 2023-03-05 ENCOUNTER — Encounter: Payer: Self-pay | Admitting: Cardiology

## 2023-03-05 VITALS — BP 130/72 | HR 71 | Ht 66.0 in | Wt 139.2 lb

## 2023-03-05 DIAGNOSIS — I471 Supraventricular tachycardia, unspecified: Secondary | ICD-10-CM

## 2023-03-05 DIAGNOSIS — R002 Palpitations: Secondary | ICD-10-CM | POA: Diagnosis not present

## 2023-03-05 DIAGNOSIS — E7800 Pure hypercholesterolemia, unspecified: Secondary | ICD-10-CM

## 2023-03-05 NOTE — Patient Instructions (Signed)

## 2023-03-07 ENCOUNTER — Ambulatory Visit (INDEPENDENT_AMBULATORY_CARE_PROVIDER_SITE_OTHER): Payer: Medicare Other

## 2023-03-07 ENCOUNTER — Telehealth: Payer: Self-pay | Admitting: Internal Medicine

## 2023-03-07 ENCOUNTER — Other Ambulatory Visit: Payer: Self-pay

## 2023-03-07 ENCOUNTER — Ambulatory Visit: Payer: Self-pay | Admitting: Internal Medicine

## 2023-03-07 ENCOUNTER — Ambulatory Visit (HOSPITAL_COMMUNITY)
Admission: EM | Admit: 2023-03-07 | Discharge: 2023-03-07 | Disposition: A | Discharge status: Home / Self Care | Payer: Medicare Other | Attending: Emergency Medicine | Admitting: Emergency Medicine

## 2023-03-07 ENCOUNTER — Encounter (HOSPITAL_COMMUNITY): Payer: Self-pay | Admitting: Emergency Medicine

## 2023-03-07 DIAGNOSIS — R053 Chronic cough: Secondary | ICD-10-CM | POA: Diagnosis not present

## 2023-03-07 DIAGNOSIS — R0602 Shortness of breath: Secondary | ICD-10-CM | POA: Diagnosis not present

## 2023-03-07 DIAGNOSIS — R0609 Other forms of dyspnea: Secondary | ICD-10-CM | POA: Diagnosis not present

## 2023-03-07 DIAGNOSIS — J209 Acute bronchitis, unspecified: Secondary | ICD-10-CM

## 2023-03-07 MED ORDER — DOXYCYCLINE HYCLATE 100 MG PO TABS: 100.0000 mg | ORAL_TABLET | Freq: Two times a day (BID) | ORAL | 0 refills | Status: DC

## 2023-03-07 MED ORDER — PREDNISONE 10 MG (21) PO TBPK: ORAL_TABLET | Freq: Every day | ORAL | 0 refills | Status: DC

## 2023-03-07 NOTE — Telephone Encounter (Signed)
Chief Complaint: difficulty breathing Symptoms: chest pain, cough, hypoxia with exertion Frequency: yesterday Pertinent Negatives: Patient denies fevers Disposition: [x] ED /[] Urgent Care (no appt availability in office) / [] Appointment(In office/virtual)/ []  [facility]/ [] Home Care/ [] Refused Recommended Disposition /[] [place] [facility]/ []  Follow-up with PCP Additional Notes: Patient c/o difficulty breathing, L chest heaviness, and hypoxia with exertion. Denies fevers. Of note, has hx of ILD and well as cardiac hx. Triager advised ED for further evaluation. Patient verbalized understanding and to call EMS for transport if needed.    Copied from CRM [id number]. Topic: Clinical - Red Word Triage >> Mar 07, 2023  9:05 AM Irine Seal wrote: [facility] that prompted transfer to Nurse Triage: patient is experiencing severe persistent productive cough with green sputum, headache, no fever. Oxygen is dropping with exertion last reading 88%  symptoms presented yesterday: Due to patients Hx of lung disease she is wanting acute visit today Reason for Disposition  Difficulty breathing  [1] MILD difficulty breathing (e.g., minimal/no SOB at rest, SOB with walking, pulse <100) AND [2] still present when not coughing  Answer Assessment - Initial Assessment Questions 1. LOCATION: "Where does it hurt?"       L side of chest feels heavy 2. RADIATION: "Does the pain go anywhere else?" (e.g., into neck, jaw, arms, back)     "Its not sharp pain"  No radiation - "it might just be from the coughing, I dont know" 3. ONSET: "When did the chest pain begin?" (Minutes, hours or days)      Last night 4. PATTERN: "Does the pain come and go, or has it been constant since it started?"  "Does it get worse with exertion?"      "It was that way when I woke up" 5. DURATION: "How long does it last" (e.g., seconds, minutes, hours)     unknown 6. SEVERITY: "How bad is the pain?"  (e.g., Scale 1-10; mild,  moderate, or severe)    - MILD (1-3): doesn't interfere with normal activities     - MODERATE (4-7): interferes with normal activities or awakens from sleep    - SEVERE (8-10): excruciating pain, unable to do any normal activities       mild 7. CARDIAC RISK FACTORS: "Do you have any history of heart problems or risk factors for heart disease?" (e.g., angina, prior heart attack; diabetes, high blood pressure, high cholesterol, smoker, or strong family history of heart disease)     No, but is on daily ASA  9. CAUSE: "What do you think is causing the chest pain?"     cough  Answer Assessment - Initial Assessment Questions "Chief Complaint (e.g., cough, sob, wheezing, fever, chills, sweat or additional symptoms) *Go to specific symptom protocol after initial questions. cough "How long have symptoms been present?" yesterday Have you tested for COVID or Flu? Note: If not, ask patient if a home test can be taken. If so, instruct patient to call back for positive results. No, I Dont have any at home  MEDICINES:   "Have you used any OTC meds to help with symptoms?" Yes If yes, ask "What medications?" Mucinex - took yesterday AM and last night - did not provide relief "Have you used your inhalers/maintenance medication?" No If yes, "What medications?" N/a If inhaler, ask "How many puffs and how often?" Note: Review instructions on medication in the chart. N/A OXYGEN: "Do you wear supplemental oxygen?" Yes If yes, "How many liters are you supposed to use?" 2L at  night "Do you monitor your oxygen levels?" Yes If yes, "What is your reading (oxygen level) today?" 88% with exertion - hx of dropping with exertion when well 95-96% at rest "What is your usual oxygen saturation reading?"  (Note: Pulmonary O2 sats should be 90% or greater) 90's    1. ONSET: "When did the cough begin?"      yesterday 2. SEVERITY: "How bad is the cough today?"      Can't stop coughing 3. SPUTUM: "Describe the  color of your sputum" (none, dry cough; clear, white, yellow, green)     green 4. HEMOPTYSIS: "Are you coughing up any blood?" If so ask: "How much?" (flecks, streaks, tablespoons, etc.)     no 5. DIFFICULTY BREATHING: "Are you having difficulty breathing?" If Yes, ask: "How bad is it?" (e.g., mild, moderate, severe)    - MILD: No SOB at rest, mild SOB with walking, speaks normally in sentences, can lie down, no retractions, pulse < 100.    - MODERATE: SOB at rest, SOB with minimal exertion and prefers to sit, cannot lie down flat, speaks in phrases, mild retractions, audible wheezing, pulse 100-120.    - SEVERE: Very SOB at rest, speaks in single words, struggling to breathe, sitting hunched forward, retractions, pulse > 120      Yes, mild - mod 6. FEVER: "Do you have a fever?" If Yes, ask: "What is your temperature, how was it measured, and when did it start?"     no 7. CARDIAC HISTORY: "Do you have any history of heart disease?" (e.g., heart attack, congestive heart failure)      no 8. LUNG HISTORY: "Do you have any history of lung disease?"  (e.g., pulmonary embolus, asthma, emphysema)     ILD - O2 at night 9. PE RISK FACTORS: "Do you have a history of blood clots?" (or: recent major surgery, recent prolonged travel, bedridden)     no 10. OTHER SYMPTOMS: "Do you have any other symptoms?" (e.g., runny nose, wheezing, chest pain)       Really bad H/A, runny nose, L side chest feels heavy Denies wheezing,  Protocols used: Cough - Acute Productive-A-AH, Chest Pain-A-AH

## 2023-03-07 NOTE — Telephone Encounter (Signed)
PT was directed by our Nurses to go to ER/Urgent Care. Their AVS notes state the following:  We will prescribe the prednisone you will need to follow-up with Dr. Marchelle Gearing in the next 24 to 48 hours contact them for an appointment  Since it is a HFU I can not set the appt out for 2-3 weeks. Please call PT to advise. I have been told these need to go to Triage and I should not schedule. Thanks.   [phone]

## 2023-03-07 NOTE — Telephone Encounter (Signed)
Routing to MR as FYI- pt going to ED with SOB and CP, coughing

## 2023-03-07 NOTE — Addendum Note (Signed)
Addended by: Kalman Shan on: 03/07/2023 05:49 PM   Modules accepted: Orders

## 2023-03-07 NOTE — Discharge Instructions (Signed)
No signs of pneumonia on your chest x-ray We will prescribe the prednisone you will need to follow-up with Dr. Marchelle Gearing in the next 24 to 48 hours contact them for an appointment If symptoms become worse or increase shortness of breath you will need to go to the emergency room

## 2023-03-07 NOTE — Telephone Encounter (Signed)
Please give appointment 4:15 PM on 03/14/2023 [the previous patient and asthma patient and does not need 30 minutes]  Also let her know if her hypoxemia is getting worse she needs to go to the ER because she is at risk for IPF flareup   Also the urgent care told me they did not give her antibiotic  -I have sent in some doxycycline please let her know   Allergies  Allergen Reactions   Caffeine     palpitations   Chlorpheniramine-Pseudoeph     palpitations   Maxifed     palpitations   Other Other (See Comments)   Pirfenidone Nausea Only    Esbriet Causes nausea and weakness

## 2023-03-07 NOTE — Telephone Encounter (Signed)
Spok with patient. She is at [facility] urgent care on [address].  Desats with cough Cough getting worse x 1 day  Green sputum +  A Concerin for Acute bronchitis an dILD flare v fibrosis worsening  Plan  - might need admit versus abx/steroids at home - need to ensure prob for PE is low (she is on aspirin) - informed her to tell the urgent care to notify me/chat /call me    PLEAES GIVE FOLLOWUP With me or APP NEXT week     SIGNATURE    Dr. Kalman Shan, M.D., F.C.C.P,  Pulmonary and Critical Care Medicine Staff Physician, [facility] Center Director - Interstitial Lung Disease  Program  Pulmonary Fibrosis [facility] at [facility] [place], [place], [address]   Pager: [phone], If no answer  -> Check AMION or Try [phone] Telephone (clinical office): [phone] Telephone (research): [phone]  11:19 AM 03/07/2023

## 2023-03-07 NOTE — ED Notes (Signed)
Updated on delay-waiting for radiology to read film

## 2023-03-07 NOTE — ED Triage Notes (Signed)
Patient has IPF Increase in sob and coughing more frequently noticed yesterday.  Chest soreness with coughing.  Patient has home oxygent.  Coughing up green phlegm, head is stuffy.    Note this morning from office instructed patient to go to ED  Patient reports home oxygen levels at 93%, 86%  Has used warm salt water, cough drops  2 days ago saw Dr Jens Som after an incident  where O2 sat dropped and heart rate increased.    Patient has been taking delsym-not helping per patient

## 2023-03-07 NOTE — Telephone Encounter (Signed)
MR- unfortunately APPs are booked out until 04/08/23 and your next opening is not until 04/25/23 Please advise if you wish to [facility] your schedule  Pt is currently set to see you on 04/15/23

## 2023-03-08 ENCOUNTER — Ambulatory Visit (HOSPITAL_COMMUNITY): Payer: Self-pay

## 2023-03-08 NOTE — Telephone Encounter (Signed)
Spoke with the pt and notified of response from Dr Marchelle Gearing  She verbalized understanding  Appt scheduled  Nothing further needed

## 2023-03-08 NOTE — Telephone Encounter (Signed)
See note from MR from 03/07/23     Signed     Expand All Collapse All  Please give appointment 4:15 PM on 03/14/2023 [the previous patient and asthma patient and does not need 30 minutes]   Also let her know if her hypoxemia is getting worse she needs to go to the ER because she is at risk for IPF flareup     Also the urgent care told me they did not give her antibiotic   -I have sent in some doxycycline please let her know           This has already been handled  Nothing further needed

## 2023-03-13 ENCOUNTER — Encounter: Payer: Self-pay | Admitting: Internal Medicine

## 2023-03-13 DIAGNOSIS — R0902 Hypoxemia: Secondary | ICD-10-CM | POA: Diagnosis not present

## 2023-03-13 DIAGNOSIS — G4730 Sleep apnea, unspecified: Secondary | ICD-10-CM | POA: Diagnosis not present

## 2023-03-14 ENCOUNTER — Encounter: Payer: Self-pay | Admitting: Internal Medicine

## 2023-03-14 ENCOUNTER — Ambulatory Visit (INDEPENDENT_AMBULATORY_CARE_PROVIDER_SITE_OTHER): Payer: Medicare Other | Admitting: Internal Medicine

## 2023-03-14 VITALS — BP 124/72 | HR 84 | Temp 97.9°F | Ht 66.0 in | Wt 138.2 lb

## 2023-03-14 DIAGNOSIS — R0902 Hypoxemia: Secondary | ICD-10-CM

## 2023-03-14 DIAGNOSIS — J84112 Idiopathic pulmonary fibrosis: Secondary | ICD-10-CM | POA: Diagnosis not present

## 2023-03-14 DIAGNOSIS — J019 Acute sinusitis, unspecified: Secondary | ICD-10-CM

## 2023-03-14 DIAGNOSIS — J209 Acute bronchitis, unspecified: Secondary | ICD-10-CM | POA: Diagnosis not present

## 2023-03-14 MED ORDER — PREDNISONE 10 MG PO TABS: ORAL_TABLET | ORAL | 0 refills | Status: DC

## 2023-03-14 MED ORDER — AZITHROMYCIN 250 MG PO TABS: 250.0000 mg | ORAL_TABLET | Freq: Every day | ORAL | 0 refills | Status: DC

## 2023-03-14 NOTE — Patient Instructions (Addendum)
 Acute sinusitis, recurrence not specified, unspecified location Acute bronchitis, unspecified organism   - some but not fully better and respect your desire that you need a repeat course of antibiotic and prednisone  Plan  - ZPAK  - Please take prednisone 40 mg x1 day, then 30 mg x1 day, then 20 mg x1 day, then 10 mg x1 day, and then 5 mg x1 day and stop  -Start this after you complete your current taper   IPF (idiopathic pulmonary fibrosis) (HCC) Exercise hypoxemia  -No evidence of flareup but I think fibrosis is progressing -Overnight pulse oximetry study 03/13/2023 suggest you do not need night oxygen - night time low HR is due to lopressos  Plan  -Return stationary oxygen for night - Use portable oxygen - Do spirometry and DLCO in 3-5 weeks before returning to see Elizabeth Espinoza to discuss next steps  Followup - PFT 04/22/23 - spiro and dlco - OV April 25, 2023 (Cancel April 15, 2023)

## 2023-03-14 NOTE — Progress Notes (Signed)
 IOV 12/04/2017: Dr Elizabeth Espinoza: [name] is a 83 year old never smoker with a history of allergic rhinitis, GERD, hypothyroidism, MVP.  She is been seen in our office in the past by Dr. Shelle Iron for restless leg syndrome-she still treated for this with Requip. On allegra, nexium prn.   She is referred today for dyspnea.  She reports that she has had an exercise routine where she walked w her husband 2 miles, exercises at the [facility], for several years. She has noticed more SOB, especially with mild hills. Sometimes now has to stop briefly to complete the walk. Occasional chest tightness, no wheeze. No overt CP. She reports daily cough, often in the am, sometimes clear mucous. She is on allegra. She had a walking stress test 09/2014 > reassuring ECG w exercise. Weight has been stable. Her last TSH was 06/25/17, 0.73.   Review of her notes shows hx possible GGI on prior CT 2014.   ROV 02/26/18 Elizabeth Espinoza --83 year old never smoker with a history of mitral valve prolapse whom I saw earlier this year for exertional dyspnea and chronic cough.  Pulmonary function testing consistent with restriction.  She has GERD and allergic rhinitis both of which could be contributing to her chronic cough.  I performed a high-resolution CT scan of the chest on 1/20 and reviewed today, this shows some patchy confluent subpleural reticular disease and groundglass attenuation with some minimal traction bronchiectasis and no frank honeycomb change.  This reflects a progression compared with 2014 and is an NSIP pattern.  Walking oximetry at her last Espinoza did not show any exertional desaturation. She is still coughing, is using fexofenadine, takes nexium qod.   [place], had pesticide exposure.   ROV 08/26/2018 Elizabeth Espinoza for 83 year old woman with a history of mitral valve prolapse, chronic cough, dyspnea with restrictive lung disease noted on pulmonary function testing.  High Res CT scan of the chest  shows some mild interstitial disease.  She was also having cough - better with flonase; still on nexium, allegra. Her exertional tolerance is improved, she has been exercising more. She remains on Requip for RLS.   We performed autoimmune labs last time, ANA positive at low titer (1: 80), SSA and SSB negative, RNP negative, RF negative, CCP negative, Anti-Smith negative, anti-SCL negative, DS DNA negative, anti-Jo negative, aldolase negative. She hasn't been seen in ILD clinic yet.    OV 10/08/2018  Subjective:  Patient ID: Elizabeth Espinoza, female , DOB: [birth year] , age 84 y.o. , MRN: [id number] , ADDRESS: [address] [place] [address]   10/08/2018 -   Chief Complaint  Patient presents with   Interstitial Lung Disease    Breathing the same as it was during August 2020 office Espinoza with Dr. Delton Espinoza     HPI Elizabeth Espinoza 83 y.o. -has been referred to the interstitial lung disease clinic because of findings of interstitial lung disease.  History is gathered from talking to her, review of Dr. Leslye Peer notes and also the integrated ILD questionnaire.  Briefly, she tells me that she was working out at [employer] and would just notice occasional dyspnea but she really did not compare it with other people.  Then in October 2019 she started walking 3 miles daily except on the days it rains with a husband.  During this time she noticed that she was falling really behind because of shortness of breath and exertional fatigue.  This resulted  in subsequent evaluation all documented above.  Findings of interstitial lung disease with subpleural reticulation suggestive of an alternative diagnosis.  Autoimmune profile essentially negative other than trace positive ANA.  She tells me that her significant major problem is exertional dyspnea when walking stairs or walking several miles.  She did not desaturate in our office several months ago.  She does not know if she desaturates when she exerts  walking 3 miles.  Pulmonary function test earlier this year is just isolated low DLCO.  She also has like a cough.  Overall since the onset of the symptoms by exercising herself more and conditioning she is somewhat better.  She did see Dr. [name] in May 2020 and in June 2020 had a coronary calcium CT which appears to have no calcium deposits.   [place] Integrated Comprehensive ILD Questionnaire  Symptoms:  -Dyspnea started suddenly and since it started it is better.  She says it is been present for years although she did tell me that it is only there since October 2019 when she noticed that.  Severity is listed below.  She does have a cough almost 1 year.  Since it started it is better.  It is mostly in the morning.  She does bring up some phlegm.  Early on in the morning it is green or yellow.  Since it started it is the same/better.  There is no wheezing.  She does have some chest tightness with this when she walks.  It is relieved by rest.  Cardiac work-up in June 2020 showed no calcium deposits.   Past Medical History : Positive for chronic longstanding acid reflux disease and thyroid disease..  In addition CT scan from January 2020 shows hiatal hernia that is small..  This presence of  sclerosis in the bony structures in January 2020.-Primary care physician has been sent a message today.  There is no asthma or COPD or heart failure rheumatoid arthritis or collagen vascular disease.  She does have GERD and hiatal hernia for several years to decades.  No sleep apnea.  No blood clots.  No hepatitis.  No tuberculosis.  No pleurisy.  , ANA positive at low titer (1: 80), SSA and SSB negative, RNP negative, RF negative, CCP negative, Anti-Smith negative, anti-SCL negative, DS DNA negative, anti-Jo negative, aldolase negative. She hasn't been seen in ILD clinic yet.    ROS:  -She does have fatigue for the last several years.  She does have some back and hip issues.  She does have dry eyes.  She  does have like some dysphagia.  There is presence of hiatal hernia-there is a small.  Acid reflux for several decades.  She also reports presence of nonspecific rash   FAMILY HISTORY of LUNG DISEASE: * -Her father died of mesothelioma in 60 83/1984 at the age of 84 otherwise no lung disease.   EXPOSURE HISTORY:   -When she was 17 she smokes cigarettes but otherwise no cigarette or tobacco use or electronic cigarette.  Never smoked marijuana.  No cocaine use no intravenous drug use.   HOME and HOBBY DETAILS :  -Single[place]family home [place] setting for the last 16 years in a 81 year old home.  No mold or mildew exposure in the [facility] duct or CPAP mask or humidifier.  No mold or mildew in the bathroom.  No pet birds in the house.  No misting [place].  No feather pillows no feather duvet.  No musical instruments.  She does some occasional gardening which  she likes.  She does do some fine-needle work.   OCCUPATIONAL HISTORY (122 questions) :  = Essentially negative except for the fact when she was a child she did some tobacco growing.  She has done home gardening for 50 years.    PULMONARY TOXICITY HISTORY (27 items):  denies  Results for ALISABETH, SELKIRK (MRN [id number]) as of 11/07/2018 11:20  Ref. Range 02/26/2018 12:17  Anti Nuclear Antibody (ANA) Latest Ref Range: NEGATIVE  POSITIVE (A)  ANA Pattern 1 Unknown Nuclear, Speckled (A)  ANA Titer 1 Latest Units: titer 1:80 (H)  Anti JO-1 Latest Ref Range: 0.0 - 0.9 AI <0.2  Cyclic Citrullin Peptide Ab Latest Units: UNITS <16  ds DNA Ab Latest Units: IU/mL <1  RA Latex Turbid. Latest Ref Range: <14 IU/mL <14  ENA SM Ab Ser-aCnc Latest Ref Range: <1.0 NEG AI <1.0 NEG  Ribonucleic Protein(ENA) Antibody, IgG Latest Ref Range: <1.0 NEG AI <1.0 NEG  SSA (Ro) (ENA) Antibody, IgG Latest Ref Range: <1.0 NEG AI <1.0 NEG  SSB (La) (ENA) Antibody, IgG Latest Ref Range: <1.0 NEG AI <1.0 NEG  Scleroderma (Scl-70) (ENA) Antibody, IgG Latest Ref Range: <1.0  NEG AI <1.0 NEG   Results for KOLBI, TOFTE (MRN [id number]) as of 11/07/2018 11:20  Ref. Range 01/29/2018 14:31  FVC-Pre Latest Units: L 2.97  FVC-%Pred-Pre Latest Units: % 98  Results for RONELLE, MICHIE (MRN [id number]) as of 11/07/2018 11:20  Ref. Range 01/29/2018 14:31  DLCO unc Latest Units: ml/min/mmHg 14.80  DLCO unc % pred Latest Units: % 54    OV 11/07/2018  Subjective:  Patient ID: Elizabeth Espinoza, female , DOB: September 01, 1940 , age 3 y.o. , MRN: [id number] , ADDRESS: [address] [place] [address]   11/07/2018 -   Chief Complaint  Patient presents with   Follow-up    Patient reports that she has sob with any exertion.    Follow-up interstitial lung disease  HPI Elizabeth Espinoza 83 y.o. -last seen September 2020.  After that she was supposed to have follow-up high-resolution CT chest and spirometry DLCO.  For some reason the spirometry DLCO is not done.  In the interim her symptoms remain the same as shown by the symptom score below.  She does note when she does heavy exertion such as climbing stairs or long uphill walks her pulse ox drops to 84% but quickly regains.  She is not interested in portable oxygen.  She had a repeat high-resolution CT chest in September 2020 and when compared to January 2020 there is no significant change.  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  I personally visualized the film.  There is diffuse bilateral subpleural reticulation and some traction bronchiectasis.  I myself would say it may be this is indeterminate for UIP.  But there is some minimal air trapping and she is done some gardening work.  There is no upper zonal predominance that would fit in with hypersensitive pneumonitis.  She has incidental findings of bony sclerosis that is again repeated in the CT scan.  Her primary care physician Cheryll Cockayne evaluated her.  I reviewed the note.  She is been  referred to Dr. Jeanie Sewer in hematology who she is seeing November 12, 2018.  She is very confused about the fact that she has a bony sclerosis problem for which she is being referred to a blood doctor [hematologist] and she thought she is  having a blood test that was ordered by me.  I reviewed the chart and clarified this concepts   Ct Chest High Resolution  Result Date: 11/05/2018 CLINICAL DATA:  83 year old female with history of interstitial lung disease. Increased shortness of breath and cough over the past year. EXAM: CT CHEST WITHOUT CONTRAST TECHNIQUE: Multidetector CT imaging of the chest was performed following the standard protocol without intravenous contrast. High resolution imaging of the lungs, as well as inspiratory and expiratory imaging, was performed. COMPARISON:  High-resolution chest CT 02/10/2018. FINDINGS: Cardiovascular: Heart size is normal. There is no significant pericardial fluid, thickening or pericardial calcification. There is aortic atherosclerosis, as well as atherosclerosis of the great vessels of the mediastinum and the coronary arteries, including calcified atherosclerotic plaque in the left anterior descending and right coronary arteries. Mild calcifications of the mitral annulus. Mediastinum/Nodes: No pathologically enlarged mediastinal or hilar lymph nodes. Please note that accurate exclusion of hilar adenopathy is limited on noncontrast CT scans. Esophagus is unremarkable in appearance. No axillary lymphadenopathy. Lungs/Pleura: High-resolution images demonstrates some patchy areas of peripheral predominant septal thickening and subpleural reticulation, with mild cylindrical bronchiectasis and peripheral bronchiolectasis. No frank honeycombing confidently identified at this time. These findings have no definitive craniocaudal gradient. In the periphery of the mid to upper lungs there also some plaque-like areas of apparent pleuroparenchymal scarring and volume loss.  Inspiratory and expiratory imaging demonstrates minimal air trapping indicative of very mild small airways disease. Overall, these imaging findings appear stable compared to the prior study. Upper Abdomen: Aortic atherosclerosis. Musculoskeletal: Mild diffuse sclerosis throughout the visualized axial and appendicular skeleton, similar to prior examinations. There are no definite focal aggressive appearing lytic or blastic lesions noted in the visualized portions of the skeleton. IMPRESSION: 1. 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 2. Aortic atherosclerosis, in addition to 2 vessel coronary artery disease. Assessment for potential risk factor modification, dietary therapy or pharmacologic therapy may be warranted, if clinically indicated. 3. Persistent mild diffuse sclerosis throughout the visualized osseous structures without discrete aggressive appearing osseous lesions. Clinical correlation for signs and symptoms of potential infiltrative process such as myelofibrosis is suggested. Aortic Atherosclerosis (ICD10-I70.0). Electronically Signed   By: Trudie Reed M.D.   On: 11/05/2018 14:36     OV 12/22/2018  Subjective:  Patient ID: Elizabeth Espinoza, female , DOB: [birth year] , age 38 y.o. , MRN: [id number] , ADDRESS: [address] [place] [address]   12/22/2018 -   Chief Complaint  Patient presents with   Follow-up    Pt states she has been doing good since last Espinoza. Pt is still coughing and will get up clear mucus in the morning.   Follow-up interstitial lung disease with CT scan October 2020 being indeterminate versus alternate diagnosis.  History of gardening.  Trace  autoimmune ANA positive.  Mild progression since 2014  HPI ESTIE SPROULE 83 y.o. -returns for follow-up.  She presents with her husband who I am meeting for the first time.  In the interim she met Dr. Dorris Fetch thoracic surgeon for surgical lung biopsy.  Her husband also met with him.  I reviewed the note.  She tells me that given the morbidity with surgical lung biopsy she wants to  undergo bronchoscopy with lavage and transbronchial biopsy first.  She understands the inherent limitations of these procedure in terms of diagnosis.  But she wants to take the lower risk profile.  Overall she feels stable.  Her symptom score is improved compared to the past.  Her walking desaturation test shows exaggerated drop in pulse ox but this was done with her wearing the mask.  She did not feel any dyspnea.  In terms of her bony sclerosis she has seen Dr. Leonides Schanz at [facility].  I reviewed the note.  He has reassured her.  Risks of pneumothorax, hemothorax, sedation/anesthesia complications such as cardiac or respiratory arrest or hypotension, stroke and bleeding all explained. Benefits of diagnosis but limitations of non-diagnosis also explained. Patient verbalized understanding and wished to proceed.   They want to have the bronchoscopy after the holidays of Christmas and new year.  This is because their house is undergoing remodeling currently.       OV 03/16/2019  Subjective:  Patient ID: Elizabeth Espinoza, female , DOB: [birth year] , age 40 y.o. , MRN: [id number] , ADDRESS: [address] [place] [address]   03/16/2019 -   Chief Complaint  Patient presents with   Follow-up    PFT performed today.  Pt states she has been doing okay since last Espinoza and states her breathing is about the same.     Finally able to review esults of envisia send out test to [place]. Date of test is 02/05/2019. 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 -returns for follow-up to discuss  bronchoscopy and lavage results.  In the interim no new respiratory issues.  They are all stable.  However she is having significant musculoskeletal issues with shoulder pain.  Serology from a year ago was negative.  They wanted to know about relatedness.  I told him it is probably not related.  We went over the bronchoscopy lavage results which showed some dominance of neutrophils consistent with UIP.  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   We had a long discussion about the benefits, risks and limitations of antifibrotic therapy.  We also discussed the choice between pirfenidone and nintedanib.       Results for ELMARIE, DEVLIN (MRN [id number]) as of 03/16/2019 11:12  Ref. Range 01/29/2018 14:31 03/16/2019 08:42  FVC-Pre Latest Units: L 2.97 2.91  FVC-%Pred-Pre Latest Units: % 98 97   Results for MORIYA, MITCHELL (MRN [id number]) as of 03/16/2019 11:12  Ref. Range 01/29/2018 14:31 03/16/2019 08:42  DLCO unc Latest Units: ml/min/mmHg 14.80 12.32  DLCO unc % pred Latest Units: % 54 60     OV 06/11/2019 - telephine Espinoza. Patient identified with 2 pHI, risks, benefit, limitations of tele Espinoza explained  Subjective:  Patient ID: Elizabeth Espinoza, female , DOB: [birth year] , age 61 y.o. , MRN: [id number] , ADDRESS: [address] [place] [address] ate of test is 02/05/2019. 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 -in this telephone Espinoza patient is now on pirfenidone.  She says she is on pirfenidone since mid April 2021.  She is on 3 pills 3 times a day.  She spacing them  4 hours apart but having some intermittent nausea every few days.  She did have an urticaria but that was before she started pirfenidone was related to Covid vaccine  that is now resolved.  She is asking about exercising at the [facility] and Covid precautions.  I advised her because she is vaccinated that the risk is low but not 0 and to take adequate precautions as tolerated.  She had liver function test and this is normal.  Her next appointment for pulmonary function test is in mid July 2021.  Recommended she make a face-to-face Espinoza with me at that time.   OV 08/21/2019   Subjective:  Patient ID: Elizabeth Espinoza, female , DOB: 1941/01/02, age 71 y.o. years. , MRN: [id number],  ADDRESS: [address] [place] [address] PCP  Pincus Sanes, MD Providers : Treatment Team:  Attending Provider: Kalman Shan, MD  Type of Espinoza: Telephone Circumstance: COVID-19 national emergency Identification of patient Elizabeth Espinoza - 2 person identifier Risks: Risks, benefits, limitations of telephone Espinoza explained Patient location: home This provider location: [facility]      Chief Complaint  Patient presents with   Follow-up    PFT 7/27--c/o sob with stairs and throat clearing mainly in the morning. stopped Esbriet on 08/12/2019.   Follow-up interstitial lung disease with CT scan October 2020 being indeterminate versus alternate diagnosis.  History of gardening.  Trace autoimmune ANA positive.  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 -presents for this telephone Espinoza for IPF.  On this telephone Espinoza she was identified with 2 person identifier.  Risks, benefits and limitations of telephone Espinoza explained.  After last Espinoza her GI symptoms worsen.  She did see Dr. Camillia Herter gastroenterologist.  She underwent endoscopy.  Dr. Loreta Ave did send the results to me I do not have this.  I remember Dr. Loreta Ave calling me and discussing  patient's GI side effects.  These were from pirfenidone so on August 12, 2019 based on my advise she start pirfenidone.  Since then her symptoms of GI nature have resolved.  Her respiratory symptoms continue to be stable.  These are all documented below.  At this point in time she feels that her quality of life is very important.  She does not want to go through the kind of GI side effect she went through with pirfenidone.  She would rather deal with the disease.  She is aware that nintedanib is a standard of care therapeutic option.  She is also aware after discussion the clinical trials as a care option particularly in the past to discovering new therapies that do not have this GI side effects that pirfenidone poses.  She had a lot of questions about Covid vaccine.  She is fully vaccinated.  She is worried about her grandson was refusing the vaccine.      OV 09/29/2019   Subjective:  Patient ID: Elizabeth Espinoza, female , DOB: 11-Nov-1940, age 13 y.o. years. , MRN: [id number],  ADDRESS: [address] [place] [address] PCP  Pincus Sanes, MD Providers : Treatment Team:  Attending Provider: Kalman Shan, MD   Chief Complaint  Patient presents with   Follow-up    LFT today?  pulmonary rehab or exercises.  inhaler trial, felt better after Advair.   Follow-up interstitial lung disease/IPF with CT scan October 2020 being indeterminate versus alternate diagnosis.  History of gardening.  Trace autoimmune ANA positive.  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 Date of test is 02/05/2019. 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  -Date of giving diagnosis is March 16, 2019  - MDD 05/05/19  - esbriet since 05/06/19- stopped 08/12/19 due to severe GI side effect    HPI Elizabeth Espinoza 83 y.o. -    presesnts  With her husband for IPF followup.  Overall she is doing  well.  She is not taking pirfenidone since July 2021.  After that she has gained weight.  All her GI symptoms have resolved.  She goes for daily walks.  She says when she reaches the top of the hill sometimes she desaturates to 88% but not always.  She monitors this closely.  She is asking for other exercises that can improve her endurance and lung function.  We discussed that exercises do not improve lung function.  She is willing to participate in pulmonary rehabilitation.  We discussed the alternative of taking nintedanib but because of the severe GI side effects that she had with Esbriet she is nervous about starting nintedanib and does not want to.  I introduced her to the concept of clinical trials as a care option.  At this point in time she wants to process this information.  We did discuss the fact that IPF is a progressive disease and antifibrotic's are indicated early in the course of the disease.  She prefers to have supportive care approach.  She wants to focus on strengthening her overall physical shape.   OV 06/30/2021 -standard of care Espinoza in this research patient.  Subjective:  Patient ID: Elizabeth Espinoza, female , DOB: [birth year] , age 74 y.o. , MRN: [id number] , ADDRESS: [address] [place] [address] PCP Pincus Sanes, MD Patient Care Team: Pincus Sanes, MD as PCP - General (Internal Medicine) Jens Som Madolyn Frieze, MD as Consulting Physician (Cardiology) Leslye Peer, MD as Consulting Physician (Pulmonary Disease) Kathyrn Sheriff, [facility] as Pharmacist (Pharmacist) Christia Reading, MD as Consulting Physician (Otolaryngology) [facility] as Consulting Physician (Ophthalmology)  This Provider for this Espinoza: Treatment Team:  Attending Provider: Kalman Shan, MD    06/30/2021 -   Chief Complaint  Patient presents with   Follow-up    Pt recently had a CT of her sinuses and is here to discuss the results.  Pt states she has not been  feeling well and states she is still having problems with her sinuses. States her breathing has been doing okay.     HPI Elizabeth Espinoza 83 y.o. -on April 19, 2021 she did have some side effects from inhaled medical product.  After that we reduce the dose and slowly escalated back and then she was tolerating it fine.  On 05/22/2021 she was prescribed antibiotics for some sinus issues she did have week 8 study Espinoza on 06/01/2021 and was doing okay.  But then on 06/20/2021 she started complaining of headache and sinus congestion and coughing and blowing yellowish mucus.  At this point in time she was taking inhaled treprostinil versus placebo investigational medical product at 4 times daily each time 12 puffs.  We told  her to reduce it to 9 puffs 4 times daily.  She tells me that perhaps she is marginally better after reducing the dose but she still having headaches.  She feels like her head is full particularly in the top part.  She also has significant cough.  She says during and after she takes the inhaled medical investigational product her cough score is 9 out of 10.  Rest of the day it is around 3 out of 10.  She coughs so much that she sometimes brings out mild yellow mucus that is like glue.  There is no diarrhea fatigue or weight loss or syncope or hemoptysis.  Definitely all the symptoms only after starting the research protocol.  She also some sore throat.  She has tried some remedial measures but these have not helped.  These include yogurt and ginger ale  For the dyspnea itself she feels stable.  She also tells me that she is able to walk a mile.  She is able to outplace her husband.  She feels her husband's declining health.  She says when she walks a mile she puts a finger probe after she rests and the pulse ox is 94%.  She is worried she might desaturate below 88%.  She does not have a forehead pulse oximeter.  She says the finger pulse ox meter does not stay on while she is walking.  I gave her a  contact of the patient support group leader to figure out a way to get a forehead probe.  Also showed her various finger proximal she can get that can stay on her finger while she exercises.  Told her the safety limit is greater than 88%.  Symptom scores below show that her symptoms are better than 2 years ago slightly.  This is from a respiratory standpoint with a cough is definitely worse after she went on the study.  And the headache is new.  She had a CT scan sinus that shows chronic findings.      OV 05/04/2022  Subjective:  Patient ID: Elizabeth Espinoza, female , DOB: 01-14-1941 , age 50 y.o. , MRN: [id number] , ADDRESS: [address] [place] [address] PCP Pincus Sanes, MD Patient Care Team: Pincus Sanes, MD as PCP - General (Internal Medicine) Jens Som Madolyn Frieze, MD as Consulting Physician (Cardiology) Leslye Peer, MD as Consulting Physician (Pulmonary Disease) Kathyrn Sheriff, [facility] as Pharmacist (Pharmacist) Christia Reading, MD as Consulting Physician (Otolaryngology) [facility] as Consulting Physician (Ophthalmology)  This Provider for this Espinoza: Treatment Team:  Attending Provider: Kalman Shan, MD  05/04/2022 -   Chief Complaint  Patient presents with   Follow-up    F/u after researched      HPI Elizabeth Hey Juniel 83 y.o. -returns for routine follow-up.  In the interim she was on inhaled treprostinil versus placebo but she did not tolerate it at all.  She had all the classic side effects of the renal drug.  We made multiple attempts in holiday and giving the drug back but she did not tolerate it at all and she finally quit.  She is out of the study now.  She did do 1 year follow-up and exited the study.  Currently she is on supportive care.  She walks on Monday through Friday 3 miles a day.  This is through [facility] and she has gotten the best walker award.  She does have some chest burning when she starts the  walking but it  does get better as she walks further.  She is not limited by dyspnea.  She checks her pulse ox after the walk a few seconds later and the pulse ox is 93% 94% at the lowest.  I did tell her to monitor it while she walks.  In terms with therapy she is not interested in standard of care antifibrotic therapy because of side effect profile.  She tried pirfenidone in the past.  She is also been intolerant to treprostinil.  She is absolutely not interested in nintedanib.  We talked about doing the revert study sponsored by [facility].  Is an oral pill.  She read the consent form which we gave it to her some weeks ago.  She does not want to do the study because of side effect profile.  There is another study called MOONSCAPE by [place].  Is an injection study.  Current patients are tolerating the study well so far.  She might be interested in this..  Will probably aim to recruit and Q3  2024.    OV 09/17/2022  Subjective:  Patient ID: Elizabeth Espinoza, female , DOB: [birth year] , age 51 y.o. , MRN: [id number] , ADDRESS: [address] [place] [address] PCP Pincus Sanes, MD Patient Care Team: Pincus Sanes, MD as PCP - General (Internal Medicine) Jens Som Madolyn Frieze, MD as PCP - Cardiology (Cardiology) Jens Som Madolyn Frieze, MD as Consulting Physician (Cardiology) Leslye Peer, MD as Consulting Physician (Pulmonary Disease) Kathyrn Sheriff, [facility] (Inactive) as Pharmacist (Pharmacist) Christia Reading, MD as Consulting Physician (Otolaryngology) [facility] as Consulting Physician (Ophthalmology)  This Provider for this Espinoza: Treatment Team:  Attending Provider: Kalman Shan, MD    09/17/2022 -   Chief Complaint  Patient presents with   Follow-up    Breathing is about the same. She denies any new co's. She is walking 3 miles daily.    HPI Elizabeth Espinoza 83 y.o. -presents for follow-up.  She is currently on supportive care.  She started off the Espinoza by saying  she was doing stable but later she did admit that when she does a 3 mile walk sometimes she gets dizziness but she is not checking her pulse ox but she is still able to finish her 3 miles at the same time.  She also when she does vacuuming she gets quite dyspneic.  No new medical problems other than the fact her leg and feet have chronic numbness and associated with restless legs. Interim Health status: No new complaints No new medical problems. No new surgeries. No ER visits. No Urgent care visits. No changes to medications.  Symptom scores and x-rays hypoxemia test remained stable but a pulmonary function test today shows a drop in DLCO.  We discussed this.  In terms of treatment she is only on supportive care after having failed antifibrotic's and previous intolerance to inhaled treprostinil study drug.  She is looking at the injection based therapy via [employer] protocol.       OV 10/23/2022  Subjective:  Patient ID: Elizabeth Espinoza, female , DOB: December 10, 1940 , age 55 y.o. , MRN: [id number] , ADDRESS: [address] [place] [address] PCP Pincus Sanes, MD Patient Care Team: Pincus Sanes, MD as PCP - General (Internal Medicine) Jens Som Madolyn Frieze, MD as PCP - Cardiology (Cardiology) Jens Som Madolyn Frieze, MD as Consulting Physician (Cardiology) Leslye Peer, MD as Consulting Physician (Pulmonary Disease) Kathyrn Sheriff, [facility] (  Inactive) as Pharmacist (Pharmacist) Christia Reading, MD as Consulting Physician (Otolaryngology) [facility] as Consulting Physician (Ophthalmology)  This Provider for this Espinoza: Treatment Team:  Attending Provider: Kalman Shan, MD    10/23/2022 -   Chief Complaint  Patient presents with   Follow-up    Ct scan f/u      HPI Elizabeth Espinoza 83 y.o. -returns for follow-up.  She continues a 3 mile walks.  She feels that the exertional shortness of breath that she was having during the 3 mile walk is somewhat better.   Reviewing of the symptom score tells me that she has been stable overall and it is hard to discern.  The big concern at last Espinoza was a drop in pulmonary function test especially DLCO.  There was concern about pulmonary hypertension versus pulmonary fibrosis worsening so so we did BNP and this was elevated increasing concern for pulmonary hypertension but the echo shows only grade 1 diastolic dysfunction normal RV.  Were also concerned about worsening pulmonary fibrosis we did a high-resolution CT chest and radiology feels it has been stable for 2 years.  Stability in lung function is confirmed by stability and symptom score and also exercise hypoxemia test.  The big change she has there is a pulmonary function test.  This could be a red herring based on variations.  Nevertheless the BNP was high.  Therefore we will recheck this today.  She did mention to me that 7 or 10 days ago she did have an episode of palpitations while doing a 3 mile walk heart rate was 170 on the Fitbit and she had to stop.  No known atrial fibrillation diagnosis.  Same thing happened 2 months ago but she forgot to mention this to Dr. Jens Som on her Espinoza early August 2024.  I have reached out to him via electronic message.  In terms of her pulmonary fibrosis therapy she is interested in other therapies that have minimal side effect profile.  The approved and antifibrotic's all have side effect profile.  She is interested experimental protocol.  We went over VIXARELIMAB monoclonal antibody given every few weeks.  There are some interesting data with prurigo.  I showed her that in the side effect profile.  She is taken the consent form.  She is interested in this at this point.     CT Chest data from date: 09/20/22  Narrative & Impression  CLINICAL DATA:  83 year old female with history of interstitial lung disease. Follow-up study.   EXAM: CT CHEST WITHOUT CONTRAST   TECHNIQUE: Multidetector CT imaging of the chest was  performed following the standard protocol without intravenous contrast. High resolution imaging of the lungs, as well as inspiratory and expiratory imaging, was performed.   RADIATION DOSE REDUCTION: This exam was performed according to the departmental dose-optimization program which includes automated exposure control, adjustment of the mA and/or kV according to patient size and/or use of iterative reconstruction technique.   COMPARISON:  High-resolution chest CT 11/14/2020.   FINDINGS: Cardiovascular: Heart size is normal. There is no significant pericardial fluid, thickening or pericardial calcification. There is aortic atherosclerosis, as well as atherosclerosis of the great vessels of the mediastinum and the coronary arteries, including calcified atherosclerotic plaque in the left anterior descending and right coronary arteries. Mild calcification of the mitral annulus.   Mediastinum/Nodes: No pathologically enlarged mediastinal or hilar lymph nodes. Please note that accurate exclusion of hilar adenopathy is limited on noncontrast CT scans. Esophagus is unremarkable in  appearance. No axillary lymphadenopathy.   Lungs/Pleura: Widespread areas of peripheral predominant septal thickening, subpleural reticulation, scattered mild cylindrical bronchiectasis and peripheral bronchiolectasis are again noted throughout the lungs bilaterally. No definite honeycombing noted. No discernible craniocaudal gradient. Overall, these findings appear essentially unchanged compared to the prior study. Inspiratory and expiratory imaging is unremarkable. No acute consolidative airspace disease. No definite suspicious appearing pulmonary nodules or masses are noted. Extensive bilateral apical pleuroparenchymal thickening and architectural distortion, similar to the prior study.   Upper Abdomen: Aortic atherosclerosis.   Musculoskeletal: There are no aggressive appearing lytic or blastic lesions  noted in the visualized portions of the skeleton.   IMPRESSION: 1. 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 Given the long-term stability, an alternative diagnosis such as fibrotic phase nonspecific interstitial pneumonia (NSIP) should also be considered. 2. Aortic atherosclerosis, in addition to left anterior descending and right coronary artery disease. Please note that although the presence of coronary artery calcium documents the presence of coronary artery disease, the severity of this disease and any potential stenosis cannot be assessed on this non-gated CT examination. Assessment for potential risk factor modification, dietary therapy or pharmacologic therapy may be warranted, if clinically indicated.   Aortic Atherosclerosis (ICD10-I70.0).     Electronically Signed   By: Trudie Reed M.D.   On: 09/29/2022 12:00      Latest Reference Range & Units 09/17/22 12:05  Pro B Natriuretic peptide (BNP) 0.0 - 100.0 pg/mL 129.0 (H)  (H): Data is abnormally high   ECHO 09/18/22   IMPRESSIONS     1. Left ventricular ejection fraction, by estimation, is 55 to 60%. The  left ventricle has normal function. The left ventricle has no regional  wall motion abnormalities. Left ventricular diastolic parameters are  consistent with Grade I diastolic  dysfunction (impaired relaxation).   2. Right ventricular systolic function is normal. The right ventricular  size is normal.   3. Left atrial size was mildly dilated.   4. The mitral valve is normal in structure. Trivial mitral valve  regurgitation. No evidence of mitral stenosis.   5. The aortic valve is tricuspid. There is mild calcification of the  aortic valve. Aortic valve regurgitation is trivial. Aortic valve  sclerosis/calcification is present, without any evidence of aortic  stenosis.   6. The  inferior vena cava is normal in size with greater than 50%  respiratory variability, suggesting right atrial pressure of 3 mmHg.     OV 02/21/2023  Subjective:  Patient ID: Elizabeth Espinoza, female , DOB: March 05, 1940 , age 89 y.o. , MRN: [id number] , ADDRESS: [address] [place] [address] PCP Pincus Sanes, MD Patient Care Team: Pincus Sanes, MD as PCP - General (Internal Medicine) Jens Som Madolyn Frieze, MD as PCP - Cardiology (Cardiology) Jens Som Madolyn Frieze, MD as Consulting Physician (Cardiology) Leslye Peer, MD as Consulting Physician (Pulmonary Disease) Kathyrn Sheriff, [facility] (Inactive) as Pharmacist (Pharmacist) Christia Reading, MD as Consulting Physician (Otolaryngology) [facility] as Consulting Physician (Ophthalmology)  This Provider for this Espinoza: Treatment Team:  Attending Provider: Kalman Shan, MD    02/21/2023 -   Chief Complaint  Patient presents with   Follow-up    Pt states she still has a ongoing productive cough ( green phlegm ) states she is feeling stronger and less fatigue.     Marland Kitchen  HPI Elizabeth Vitullo  Espinoza 83 y.o. -returns for follow-up.  At this follow-up Espinoza she is admitting that she is desaturating with exertion.  She notices when she works out on her exercise bike she goes down to 89 or 88%.  She stopped going to the [facility] because of pulse oximeter is alarming when she gets to 88% and she is doing it easily.  She says at that time she does not necessarily feel short of breath although at later time point she did admit for shortness of breath but overall symptom questionnaire shows continued stability but she is definitely desaturating.  She continues to have tachycardia review of external records indicate she is having SVTs.  Cardiology tried Lopressor but she did not tolerate.  She has a historical tolerance issues with many medications.  She did tolerate the oral pill versus placebo on [place] study but she did not  tolerate standard of care Esbriet.  She did not tolerate inhaled treprostinil versus placebo in a study which we think she got the actual study drug..  Today exercise hypoxemia test shows worsening she is desaturating to 88%.  This something she had not done with similar exertion in the past.  I had a frank conversation with her I did indicate to her that I am really concerned the disease is progressive.  Indicated to her that she should strongly consider trying standard of care nintedanib.  I went over the diarrhea side effects.  She is apprehensive of the side effect but also concerned about the $2000 co-pay cost.  I told her this is standard under the new inflation reduction act.  She is still processing the information.  I told her that the advent of exercise hypoxemia is a negative prognostic sign.  Told her to consider at least option B  which is to get into a clinical trial with injection monoclonal antibody Vixarelimab versus placebo.  So far all patients are tolerating it well.  Short of the side effect profile.  She already took the consent last time but did not read it.  We are giving her the consent again.  Told her she can add standard of care nintedanib once she starts the study.  Least preferred option is to continue supportive care.   For her quality of life:  - recommend portoabl o2 - test ono      OV 03/14/2023  Subjective:  Patient ID: Elizabeth Espinoza, female , DOB: 03-Sep-1940 , age 59 y.o. , MRN: [id number] , ADDRESS: [address] [place] [address] PCP Pincus Sanes, MD Patient Care Team: Pincus Sanes, MD as PCP - General (Internal Medicine) Jens Som Madolyn Frieze, MD as PCP - Cardiology (Cardiology) Jens Som Madolyn Frieze, MD as Consulting Physician (Cardiology) Leslye Peer, MD as Consulting Physician (Pulmonary Disease) Kathyrn Sheriff, [facility] (Inactive) as Pharmacist (Pharmacist) Christia Reading, MD as Consulting Physician (Otolaryngology) [facility] as Consulting Physician (Ophthalmology)  This Provider for this Espinoza: Treatment Team:  Attending Provider: Kalman Shan, MD    03/14/2023 -   Chief Complaint  Patient presents with   Follow-up    Follow-up interstitial lung disease/IPF with CT scan October 2020 being indeterminate versus alternate diagnosis.  History of gardening.  Trace autoimmune ANA positive.  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 Date of test is 02/05/2019. Result is POSITIVE FOR  UIP  -Date of giving diagnosis is March 16, 2019  - MDD 05/05/19  - esbriet since 05/06/19- stopped 08/12/19 due to severe GI side effect   IPF patient on  -No approved antifibrotic based on personal choice and side effect profile -On inhaled treprostinil versus placebo [[place]] -phase 3 trial  -Consent February 2023  -Randomization April 06 2021 -> QUIT early due to side effects (presumably got real drug) -> completed 1 year follow-up spring 2024  HPI Elizabeth Espinoza 83 y.o. -presents for acute Espinoza follow-up.  After I saw her last time she had acute bronchitis and was desaturating ended up in the ER last Thursday had to call an antibiotic and prednisone.  She now presents with her husband.  She says she is a lot better but only 50% better.  She still has otalgia sore throat chest being sore coughing colored sinus drainage.  She is finished with her doxycycline she still has a few days of prednisone left.  She wants a repeat course of antibiotic.  She says that she still desaturates at home walking 50 feet.  Of note last time we prescribed portable oxygen but it appears that she also got a oxygen concentrator at home.  She had a overnight pulse oximetry study and in this there is no desaturations past 4-1/2 minutes.  She is slightly bradycardic but she is on Lopressor.  Therefore  I did advise her that she could return her concentrator and just retain her portable oxygen system.  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  SYMPTOM SCALE - ILD     Inhaled Tyvaso v Placebo. 05/04/2022 Supportive care  09/17/2022 Supporitve care 10/23/2022 Supportive care 02/21/2023 Supportive care 03/14/2023 Acute bronchitis sinusitis otherwise supportive care.  Just completed doxycycline.  Is on prednisone taper.  O2 use 10/08/2018  11/07/2018  12/22/2018  06/30/21       Shortness of Breath 0 -> 5 scale with 5 being worst (score 6 If unable to do)  ra ra ra ra ra ra  At rest 0 0 0 0 0 0 0 5  Simple tasks - showers, clothes change, eating, shaving 1 1 0 1 1 1.5 1 4   Household (dishes, doing bed, laundry) 2 2 2 3 1  for mos stuff to 5 with vaccuming (3) 1.5 (4 vaccuum) 1.5 3  Shopping 2 2 0.5 2 2 2 3  x  Walking level at own pace 2 1 0.5 1 1 1  x 4  Walking up Stairs 4 5 3  2.5 3.5 2.5 3.5 5  Total (40 - 48) Dyspnea Score 11 11 6  9.5 9.5 8.5 9 21   How bad is your cough? 1 3 2.5 5 3 2  3.5 5  How bad is your fatigue 2 2.5 1.5 am, 4.5 pm 4 2 2.5 4.5 4  nausea    1 0 0 1 0  vomit    0 00 0 0 00  diarrhea    0 0 0 0 0  anxiet    00 0 0 0 0  depression    0 0 0 0 0  pain    Headacne,, sinus complaints  0 0 0   Simple office walk 185 feet x  3 laps goal with forehead probe 06/30/2021  09/17/2022  10/23/2022  02/21/2023  02/21/2023  03/14/2023  O2 used ra ra ra ra ra ra  Number laps completed 3 Sit stand x 10 Sit stand x 15   Sit stand x 15  Comments about pace Fast pace  moderate   1  Resting Pulse Ox/HR 96% and 74/min 96% and HR 62 97% and HR 75 100% and HR 69 100% n 100% and HR     98% and HR 89 88% and HR 103 96% after 2 LAPS on 2 LNC   Final Pulse Ox/HR 96% and 117/min 94% and HR 77 94% and HR 73      Desaturated </= 88% no no no     Desaturated <= 3% points n no yes     Got Tachycardic >/= 90/min yes no no     Symptoms at end of test No complaints No compalints No complaints     Miscellaneous comments Sx           PFT     Latest Ref Rng & Units 02/18/2023   11:05 AM 09/17/2022   10:11 AM 03/04/2020    1:00 PM 09/15/2019    5:13 PM 03/16/2019    8:42 AM 01/29/2018    2:31 PM  PFT Results  FVC-Pre L 2.77  P 2.78  3.07  2.92  2.91  2.97   FVC-Predicted Pre % 102  P 103  104  99  97  98   FVC-Post L      2.89   FVC-Predicted Post %      95   Pre FEV1/FVC % % 77  P 78  78  80  79  81   Post FEV1/FCV % %      83   FEV1-Pre L 2.12  P 2.18  2.39  2.34  2.29  2.40   FEV1-Predicted Pre % 106  P 108  108  106  102  105   FEV1-Post L      2.39   DLCO uncorrected ml/min/mmHg 9.27  P 8.80  12.94  12.84  12.32  14.80   DLCO UNC% % 47  P 45  63  63  60  54   DLCO corrected ml/min/mmHg 9.27  P 8.80  12.94  13.26  12.91    DLCO COR %Predicted % 47  P 45  63  65  63    DLVA Predicted % 57  P 62  70  71  73  68     P Preliminary result       LAB RESULTS last 96 hours No results found.       has a past medical history of Allergic rhinitis, GERD (gastroesophageal reflux disease), Glaucoma, Hiatal hernia, Hyperlipidemia, Hypothyroidism, ILD (interstitial lung disease) (HCC), Lichen planopilaris, MVP (mitral valve prolapse), Restless legs, and Urticaria.   reports that she has never smoked. She has never used smokeless tobacco.  Past Surgical History:  Procedure Laterality Date   APPENDECTOMY     with TAH   CARDIAC CATHETERIZATION  1989   negative   COLONOSCOPY  2005   Dr Juanda Chance   COLONOSCOPY  01/2013   Dr Loreta Ave   esophageal dilation  07/04/2011   Dr Neita Garnet SIGMOIDOSCOPY  1999    Dr Juanda Chance   KNEE ARTHROSCOPY  2001, 2005   Dr Eulah Pont   bilat   TOTAL ABDOMINAL HYSTERECTOMY     for Endometriosis; no BSO, Dr Arletha Grippe   UPPER GI ENDOSCOPY  01/2013   Dr Loreta Ave   VIDEO  BRONCHOSCOPY Bilateral 02/05/2019   Procedure: VIDEO BRONCHOSCOPY WITH FLUORO;  Surgeon: Kalman Shan, MD;  Location: [facility] ENDOSCOPY;  Service: Endoscopy;  Laterality: Bilateral;    Allergies  Allergen Reactions   Caffeine     palpitations   Chlorpheniramine-Pseudoeph     palpitations   Maxifed     palpitations   Other Other (See Comments)   Pirfenidone Nausea Only    Esbriet Causes nausea and weakness    Immunization History  Administered Date(s) Administered   Fluad Quad(high Dose 65+) 11/15/2020, 11/14/2021, 11/23/2022   Influenza Split 11/07/2010   Influenza Whole 10/30/2009, 11/27/2011   Influenza, High Dose Seasonal PF 10/27/2012, 11/05/2013, 10/01/2017, 09/11/2018, 11/23/2019   Influenza-Unspecified 11/24/2014, 10/07/2015, 10/08/2016   PFIZER(Purple Top)SARS-COV-2 Vaccination 03/14/2019, 04/06/2019, 10/27/2019   Pneumococcal Conjugate-13 10/07/2015   Pneumococcal Polysaccharide-23 07/02/2017   Respiratory Syncytial Virus Vaccine,Recomb Aduvanted(Arexvy) 12/27/2021   Tdap 02/11/2012   Zoster Recombinant(Shingrix) 10/01/2017, 12/03/2017   Zoster, Live 01/09/2012    Family History  Problem Relation Age of Onset   Lung cancer Father        Asbestos with mesothelioma   Heart disease Paternal Grandmother        ? etiology   Diabetes Paternal Grandmother    Diabetes Brother    Uterine cancer Paternal Aunt    Stroke Paternal Grandfather        > 55   Heart attack Brother 27   Allergies Sister    Aneurysm Mother        congenital   Stroke Brother        two strokes   Barrett's esophagus Brother      Current Outpatient Medications:    aspirin EC 81 MG tablet, Take 1 tablet (81 mg total) by mouth daily., Disp: 90 tablet, Rfl: 3   cetirizine (ZYRTEC) 10 MG tablet, Take 10 mg by mouth daily., Disp: , Rfl:    cholecalciferol (VITAMIN D3) 25 MCG (1000 UT) tablet, Take 1,000 Units by mouth daily., Disp: , Rfl:    clobetasol (TEMOVATE) 0.05 % external solution, ,  Disp: , Rfl:    doxycycline (VIBRA-TABS) 100 MG tablet, Take 1 tablet (100 mg total) by mouth 2 (two) times daily., Disp: 10 tablet, Rfl: 0   fluticasone (FLONASE) 50 MCG/ACT nasal spray, Place 1 spray into both nostrils daily as needed for allergies or rhinitis., Disp: , Rfl:    hydroxychloroquine (PLAQUENIL) 200 MG tablet, Take 1 tablet by mouth twice a day, Disp: 60 tablet, Rfl: 9   magnesium oxide (MAG-OX) 400 MG tablet, Take 400 mg by mouth daily., Disp: , Rfl:    metoprolol tartrate (LOPRESSOR) 25 MG tablet, Take 0.5 tablets (12.5 mg total) by mouth 2 (two) times daily., Disp: 60 tablet, Rfl: 3   Multiple Vitamins-Minerals (ZINC PO), Take 1 tablet by mouth., Disp: , Rfl:    pravastatin (PRAVACHOL) 40 MG tablet, TAKE 1 TABLET BY MOUTH DAILY GENERIC EQUIVALENT FOR PRAVACHOL, Disp: 90 tablet, Rfl: 2   predniSONE (STERAPRED UNI-PAK 21 TAB) 10 MG (21) TBPK tablet, Take by mouth daily. Take 6 tabs by mouth daily  for 2 days, then 5 tabs for 2 days, then 4 tabs for 2 days, then 3 tabs for 2 days, 2 tabs for 2 days, then 1 tab by mouth daily for 2 days, Disp: 42 tablet, Rfl: 0   Probiotic Product (PROBIOTIC ADVANCED) CAPS, Take by mouth., Disp: , Rfl:    SYNTHROID 88 MCG tablet, TAKE 1 TABLET DAILY 30 MINUTES BEFORE BREAKFAST, Disp: 90  tablet, Rfl: 3   vitamin B-12 (CYANOCOBALAMIN) 500 MCG tablet, Take 500 mcg by mouth daily., Disp: , Rfl:       Objective:   Vitals:   03/14/23 1606  BP: 124/72  Pulse: 84  Temp: 97.9 F (36.6 C)  TempSrc: Oral  SpO2: 100%  Weight: 138 lb 3.2 oz (62.7 kg)  Height: 5\' 6"  (1.676 m)    Estimated body mass index is 22.31 kg/m as calculated from the following:   Height as of this encounter: 5\' 6"  (1.676 m).   Weight as of this encounter: 138 lb 3.2 oz (62.7 kg).  @WEIGHTCHANGE @  [employer]   03/14/23 1606  Weight: 138 lb 3.2 oz (62.7 kg)     Physical Exam   General: No distress. Looks well O2 at rest: no Cane present: no Sitting in wheel  chair: no Frail: no Obese: no Neuro: Alert and Oriented x 3. GCS 15. Speech normal Psych: Pleasant Resp:  Barrel Chest - no.  Wheeze - no, Crackles - yes, No overt respiratory distress CVS: Normal heart sounds. Murmurs - no Ext: Stigmata of Connective Tissue Disease - no HEENT: Normal upper airway. PEERL +. No post nasal drip        Assessment:       ICD-10-CM   1. Acute sinusitis, recurrence not specified, unspecified location  J01.90     2. Acute bronchitis, unspecified organism  J20.9     3. IPF (idiopathic pulmonary fibrosis) (HCC)  J84.112     4. Exercise hypoxemia  R09.02          Plan:     Patient Instructions  Acute sinusitis, recurrence not specified, unspecified location Acute bronchitis, unspecified organism   - some but not fully better and respect your desire that you need a repeat course of antibiotic and prednisone  Plan  - ZPAK  - Please take prednisone 40 mg x1 day, then 30 mg x1 day, then 20 mg x1 day, then 10 mg x1 day, and then 5 mg x1 day and stop  -Start this after you complete your current taper   IPF (idiopathic pulmonary fibrosis) (HCC) Exercise hypoxemia  -No evidence of flareup but I think fibrosis is progressing -Overnight pulse oximetry study 03/13/2023 suggest you do not need night oxygen - night time low HR is due to lopressos  Plan  -Return stationary oxygen for night - Use portable oxygen - Do spirometry and DLCO in 3-5 weeks before returning to see Dr. Marchelle Gearing to discuss next steps  Followup - PFT 04/22/23 - spiro and dlco - OV April 25, 2023 (Cancel April 15, 2023)   FOLLOWUP Return for - PFT 3/313/25 - spiro and dlco - OV April 25, 2023 (Cancel April 15, 2023).    SIGNATURE    Dr. Kalman Shan, M.D., F.C.C.P,  Pulmonary and Critical Care Medicine Staff Physician, [facility] Director - Interstitial Lung Disease  Program  Pulmonary Fibrosis [facility] at [facility] [place], [place], [address]  Pager: [phone], If no answer or between  15:00h - 7:00h: call [phone] Telephone: [phone]  4:37 PM 03/14/2023

## 2023-03-14 NOTE — Telephone Encounter (Signed)
 Pt has any appt on 03/14/23 at 4pm

## 2023-03-19 ENCOUNTER — Telehealth: Payer: Self-pay | Admitting: Internal Medicine

## 2023-03-19 NOTE — Telephone Encounter (Signed)
 Plan  - ZPAK  - Please take prednisone 40 mg x1 day, then 30 mg x1 day, then 20 mg x1 day, then 10 mg x1 day, and then 5 mg x1 day and stop             -Start this after you complete your current taper   On 2/20, prednisone Rx was written as above but the dispense was #9. Dispense should be #11. Verbal has been given to [facility] with [facility].  Nothing further needed.

## 2023-03-19 NOTE — Telephone Encounter (Signed)
 [facility] needs clarification on prednisone for this patient.

## 2023-03-21 ENCOUNTER — Encounter: Payer: Self-pay | Admitting: Internal Medicine

## 2023-03-27 NOTE — Telephone Encounter (Signed)
 Okay no problem.  Your heart I do not think your heart rate was too low at night to warrant a cardiology workup but it is something you can talk with your cardiologist if you are concerned

## 2023-03-28 ENCOUNTER — Ambulatory Visit: Payer: Medicare Other

## 2023-03-28 VITALS — Ht 66.0 in | Wt 135.0 lb

## 2023-03-28 DIAGNOSIS — Z0000 Encounter for general adult medical examination without abnormal findings: Secondary | ICD-10-CM

## 2023-03-28 NOTE — Progress Notes (Unsigned)
 Subjective:   Elizabeth Espinoza is a 83 y.o. who presents for a Medicare Wellness preventive visit.  Visit Complete: Virtual I connected with  Elizabeth Espinoza on 03/28/23 by a audio enabled telemedicine application and verified that I am speaking with the correct person using two identifiers.  Patient Location: Home  Provider Location: Home Office  I discussed the limitations of evaluation and management by telemedicine. The patient expressed understanding and agreed to proceed.  Vital Signs: Because this visit was a virtual/telehealth visit, some criteria may be missing or patient reported. Any vitals not documented were not able to be obtained and vitals that have been documented are patient reported.  VideoDeclined- This patient declined Librarian, academic. Therefore the visit was completed with audio only.  AWV Questionnaire: Yes: Patient Medicare AWV questionnaire was completed by the patient on 03/21/2023; I have confirmed that all information answered by patient is correct and no changes since this date.  Cardiac Risk Factors include: advanced age (>57men, >75 women);Other (see comment);dyslipidemia, Risk factor comments: PAD, ILD, Aortic atherosclerosis     Objective:    Today's Vitals   03/28/23 1500  Weight: 135 lb (61.2 kg)  Height: 5\' 6"  (1.676 m)   Body mass index is 21.79 kg/m.     03/28/2023    3:29 PM 03/27/2022    3:49 PM 10/20/2020   11:33 AM 11/06/2019    2:10 PM 07/09/2019   10:57 AM 02/05/2019   12:31 PM 11/12/2018   10:21 AM  Advanced Directives  Does Patient Have a Medical Advance Directive? Yes Yes Yes Yes Yes Yes No;Yes  Type of Estate agent of [place];Living will Healthcare Power of [place];Living will Living will;Healthcare Power of Attorney Living will  Living will Healthcare Power of [place];Living will  Does patient want to make changes to medical advance directive?   No - Patient declined  No -  Patient declined    Copy of Healthcare Power of Attorney in Chart? No - copy requested No - copy requested No - copy requested    No - copy requested    Current Medications (verified) Outpatient Encounter Medications as of 03/28/2023  Medication Sig   aspirin EC 81 MG tablet Take 1 tablet (81 mg total) by mouth daily.   cetirizine (ZYRTEC) 10 MG tablet Take 10 mg by mouth daily.   cholecalciferol (VITAMIN D3) 25 MCG (1000 UT) tablet Take 1,000 Units by mouth daily.   clobetasol (TEMOVATE) 0.05 % external solution    fluticasone (FLONASE) 50 MCG/ACT nasal spray Place 1 spray into both nostrils daily as needed for allergies or rhinitis.   hydroxychloroquine (PLAQUENIL) 200 MG tablet Take 1 tablet by mouth twice a day   magnesium oxide (MAG-OX) 400 MG tablet Take 400 mg by mouth daily.   pravastatin (PRAVACHOL) 40 MG tablet TAKE 1 TABLET BY MOUTH DAILY GENERIC EQUIVALENT FOR PRAVACHOL   Probiotic Product (PROBIOTIC ADVANCED) CAPS Take by mouth.   SYNTHROID 88 MCG tablet TAKE 1 TABLET DAILY 30 MINUTES BEFORE BREAKFAST   vitamin B-12 (CYANOCOBALAMIN) 500 MCG tablet Take 500 mcg by mouth daily.   azithromycin (ZITHROMAX) 250 MG tablet Take 1 tablet (250 mg total) by mouth daily. Take 2 tablets on day 1. Then take 1 tablet daily for 4 days (Patient not taking: Reported on 03/28/2023)   doxycycline (VIBRA-TABS) 100 MG tablet Take 1 tablet (100 mg total) by mouth 2 (two) times daily. (Patient not taking: Reported on 03/28/2023)   metoprolol  tartrate (LOPRESSOR) 25 MG tablet Take 0.5 tablets (12.5 mg total) by mouth 2 (two) times daily.   Multiple Vitamins-Minerals (ZINC PO) Take 1 tablet by mouth. (Patient not taking: Reported on 03/28/2023)   predniSONE (DELTASONE) 10 MG tablet Please take prednisone 40 mg x1 day, then 30 mg x1 day, then 20 mg x1 day, then 10 mg x1 day, and then 5 mg x1 day and stop (Patient not taking: Reported on 03/28/2023)   predniSONE (STERAPRED UNI-PAK 21 TAB) 10 MG (21) TBPK tablet Take  by mouth daily. Take 6 tabs by mouth daily  for 2 days, then 5 tabs for 2 days, then 4 tabs for 2 days, then 3 tabs for 2 days, 2 tabs for 2 days, then 1 tab by mouth daily for 2 days (Patient not taking: Reported on 03/28/2023)   No facility-administered encounter medications on file as of 03/28/2023.    Allergies (verified) Caffeine, Chlorpheniramine-pseudoeph, Maxifed, Other, and Pirfenidone   History: Past Medical History:  Diagnosis Date   Allergic rhinitis    GERD (gastroesophageal reflux disease)    Glaucoma    SUSPECT   Hiatal hernia    Hyperlipidemia    LDL goal = < 100   Hypothyroidism    ILD (interstitial lung disease) (HCC)    Lichen planopilaris    MVP (mitral valve prolapse)    documented on 2 D ECHO   Restless legs    Urticaria    Past Surgical History:  Procedure Laterality Date   APPENDECTOMY     with TAH   CARDIAC CATHETERIZATION  1989   negative   COLONOSCOPY  2005   Dr Juanda Chance   COLONOSCOPY  01/2013   Dr Loreta Ave   esophageal dilation  07/04/2011   Dr Neita Garnet SIGMOIDOSCOPY  1999    Dr Juanda Chance   KNEE ARTHROSCOPY  2001, 2005   Dr Eulah Pont   bilat   TOTAL ABDOMINAL HYSTERECTOMY     for Endometriosis; no BSO, Dr Arletha Grippe   UPPER GI ENDOSCOPY  01/2013   Dr Loreta Ave   VIDEO BRONCHOSCOPY Bilateral 02/05/2019   Procedure: VIDEO BRONCHOSCOPY WITH FLUORO;  Surgeon: Kalman Shan, MD;  [facility]COPY;  Service: Endoscopy;  Laterality: Bilateral;   Family History  Problem Relation Age of Onset   Lung cancer Father        Asbestos with mesothelioma   Heart disease Paternal Grandmother        ? etiology   Diabetes Paternal Grandmother    Diabetes Brother    Uterine cancer Paternal Aunt    Stroke Paternal Grandfather        > 55   Heart attack Brother 19   Allergies Sister    Aneurysm Mother        congenital   Stroke Brother        two strokes   Barrett's esophagus Brother    Social History   Socioeconomic History   Marital status:  Married    Spouse name: Fayrene Fearing   Number of children: 2   Years of education: Not on file   Highest education level: 12th grade  Occupational History   Occupation: retired from [employer] retired  Tobacco Use   Smoking status: Never   Smokeless tobacco: Never  Vaping Use   Vaping status: Never Used  Substance and Sexual Activity   Alcohol use: Yes    Comment:  rarely   Drug use: No   Sexual activity: Yes  Other Topics Concern  Not on file  Social History Narrative   Live at home with husband   Social Drivers of Health   Financial Resource Strain: Low Risk  (03/21/2023)   Overall Financial Resource Strain (CARDIA)    Difficulty of Paying Living Expenses: Not hard at all  Food Insecurity: No Food Insecurity (03/21/2023)   Hunger Vital Sign    Worried About Running Out of Food in the Last Year: Never true    Ran Out of Food in the Last Year: Never true  Transportation Needs: No Transportation Needs (03/21/2023)   PRAPARE - Administrator, Civil Service (Medical): No    Lack of Transportation (Non-Medical): No  Physical Activity: Unknown (03/28/2023)   Exercise Vital Sign    Days of Exercise per Week: 7 days    Minutes of Exercise per Session: Not on file  Stress: No Stress Concern Present (03/21/2023)   [employer] of Occupational Health - Occupational Stress Questionnaire    Feeling of Stress : Not at all  Social Connections: Moderately Isolated (03/21/2023)   Social Connection and Isolation Panel [NHANES]    Frequency of Communication with Friends and Family: More than three times a week    Frequency of Social Gatherings with Friends and Family: Once a week    Attends Religious Services: Never    Database administrator or Organizations: No    Attends Engineer, structural: Not on file    Marital Status: Married    Tobacco Counseling Counseling given: Not Answered    Clinical Intake:  Pre-visit preparation completed: Yes  Pain : No/denies  pain     BMI - recorded: 21.79 Nutritional Status: BMI of 19-24  Normal Nutritional Risks: None Diabetes: No  How often do you need to have someone help you when you read instructions, pamphlets, or other written materials from your doctor or pharmacy?: 1 - Never  Interpreter Needed?: No  Information entered by :: Mehki Klumpp, RMA   Activities of Daily Living     03/21/2023    2:11 PM  In your present state of health, do you have any difficulty performing the following activities:  Hearing? 1  Comment Wears hearing aides  Vision? 0  Difficulty concentrating or making decisions? 0  Walking or climbing stairs? 0  Dressing or bathing? 0  Doing errands, shopping? 0  Preparing Food and eating ? N  Using the Toilet? N  In the past six months, have you accidently leaked urine? N  Do you have problems with loss of bowel control? N  Managing your Medications? N  Managing your Finances? N  Housekeeping or managing your Housekeeping? N    Patient Care Team: Pincus Sanes, MD as PCP - General (Internal Medicine) Jens Som Madolyn Frieze, MD as PCP - Cardiology (Cardiology) Jens Som Madolyn Frieze, MD as Consulting Physician (Cardiology) Leslye Peer, MD as Consulting Physician (Pulmonary Disease) Kathyrn Sheriff, [facility] (Inactive) as Pharmacist (Pharmacist) Christia Reading, MD as Consulting Physician (Otolaryngology) [facility] as Consulting Physician (Ophthalmology)  Indicate any recent Medical Services you may have received from other than [facility] providers in the past year (date may be approximate).     Assessment:   This is a routine wellness examination for Elizabeth Espinoza.  Hearing/Vision screen Hearing Screening - Comments:: Wears hearing aides  Vision Screening - Comments:: Wears eyeglasses   Goals Addressed             This Visit's Progress    Client understands  the importance of follow-up with providers by attending scheduled visits.   On track    My  goal for 2024 is to breathe better.       Depression Screen     07/16/2022    8:24 AM 05/01/2022    2:17 PM 03/27/2022    3:47 PM 03/21/2021    2:40 PM 10/20/2020   11:31 AM 01/07/2020   10:21 AM 01/07/2020   10:20 AM  PHQ 2/9 Scores  PHQ - 2 Score 0 0 0 0 0 0 0  PHQ- 9 Score  7 0   2     Fall Risk     03/21/2023    2:11 PM 03/14/2023    4:06 PM 07/16/2022    8:24 AM 05/01/2022    2:16 PM 03/27/2022    3:50 PM  Fall Risk   Falls in the past year? 0 0 0 0 0  Number falls in past yr: 0  0 0 0  Injury with Fall? 0  0 0 0  Risk for fall due to : No Fall Risks  No Fall Risks No Fall Risks No Fall Risks  Follow up Falls prevention discussed;Falls evaluation completed  Falls evaluation completed Falls evaluation completed Falls prevention discussed    MEDICARE RISK AT HOME:  Medicare Risk at Home If so, are there any without handrails?: (Patient-Rptd) No Home free of loose throw rugs in walkways, pet beds, electrical cords, etc?: (Patient-Rptd) Yes Adequate lighting in your home to reduce risk of falls?: (Patient-Rptd) Yes Life alert?: No Use of a cane, walker or w/c?: (Patient-Rptd) No Grab bars in the bathroom?: (Patient-Rptd) Yes Shower chair or bench in shower?: (Patient-Rptd) No Elevated toilet seat or a handicapped toilet?: (Patient-Rptd) Yes  TIMED UP AND GO:  Was the test performed?  No  Cognitive Function: 6CIT completed        03/28/2023    3:05 PM 03/27/2022    3:51 PM 07/09/2019   11:03 AM  6CIT Screen  What Year? 0 points 0 points 0 points  What month? 0 points 0 points 0 points  What time? 0 points 0 points 0 points  Count back from 20 0 points 0 points 0 points  Months in reverse 0 points 0 points 0 points  Repeat phrase 0 points 0 points 0 points  Total Score 0 points 0 points 0 points    Immunizations Immunization History  Administered Date(s) Administered   Fluad Quad(high Dose 65+) 11/15/2020, 11/14/2021, 11/23/2022   Influenza Split 11/07/2010    Influenza Whole 10/30/2009, 11/27/2011   Influenza, High Dose Seasonal PF 10/27/2012, 11/05/2013, 10/01/2017, 09/11/2018, 11/23/2019   Influenza-Unspecified 11/24/2014, 10/07/2015, 10/08/2016   PFIZER(Purple Top)SARS-COV-2 Vaccination 03/14/2019, 04/06/2019, 10/27/2019   Pneumococcal Conjugate-13 10/07/2015   Pneumococcal Polysaccharide-23 07/02/2017   Respiratory Syncytial Virus Vaccine,Recomb Aduvanted(Arexvy) 12/27/2021   Tdap 02/11/2012   Zoster Recombinant(Shingrix) 10/01/2017, 12/03/2017   Zoster, Live 01/09/2012    Screening Tests Health Maintenance  Topic Date Due   Medicare Annual Wellness (AWV)  03/27/2023   DEXA SCAN  01/31/2027   Pneumonia Vaccine 74+ Years old  Completed   INFLUENZA VACCINE  Completed   Zoster Vaccines- Shingrix  Completed   HPV VACCINES  Aged Out   DTaP/Tdap/Td  Discontinued   COVID-19 Vaccine  Discontinued   Hepatitis C Screening  Discontinued    Health Maintenance  Health Maintenance Due  Topic Date Due   Medicare Annual Wellness (AWV)  03/27/2023   Health Maintenance Items Addressed: See  Nurse Notes  Additional Screening:  Vision Screening: Recommended annual ophthalmology exams for early detection of glaucoma and other disorders of the eye.  Dental Screening: Recommended annual dental exams for proper oral hygiene  Community Resource Referral / Chronic Care Management: CRR required this visit?  No   CCM required this visit?  No     Plan:     I have personally reviewed and noted the following in the patient's chart:   Medical and social history Use of alcohol, tobacco or illicit drugs  Current medications and supplements including opioid prescriptions. Patient is not currently taking opioid prescriptions. Functional ability and status Nutritional status Physical activity Advanced directives List of other physicians Hospitalizations, surgeries, and ER visits in previous 12 months Vitals Screenings to include cognitive,  depression, and falls Referrals and appointments  In addition, I have reviewed and discussed with patient certain preventive protocols, quality metrics, and best practice recommendations. A written personalized care plan for preventive services as well as general preventive health recommendations were provided to patient.     Vedanshi Massaro L Anchor Dwan, CMA   03/28/2023   After Visit Summary: (MyChart) Due to this being a telephonic visit, the after visit summary with patients personalized plan was offered to patient via MyChart   Notes: Please refer to Routing Comments.

## 2023-03-28 NOTE — Patient Instructions (Signed)
 [name] , Thank you for taking time to come for your Medicare Wellness Visit. I appreciate your ongoing commitment to your health goals. Please review the following plan we discussed and let me know if I can assist you in the future.   Referrals/Orders/Follow-Ups/Clinician Recommendations: It was nice talking to you today.  You are due for a Tetanus vaccine.  Keep up the good work.    This is a list of the screening recommended for you and due dates:  Health Maintenance  Topic Date Due   Medicare Annual Wellness Visit  03/27/2023   DEXA scan (bone density measurement)  01/31/2027   Pneumonia Vaccine  Completed   Flu Shot  Completed   Zoster (Shingles) Vaccine  Completed   HPV Vaccine  Aged Out   DTaP/Tdap/Td vaccine  Discontinued   COVID-19 Vaccine  Discontinued   Hepatitis C Screening  Discontinued    Advanced directives: (Copy Requested) Please bring a copy of your health care power of attorney and living will to the office to be added to your chart at your convenience.  Next Medicare Annual Wellness Visit scheduled for next year: Yes

## 2023-04-02 DIAGNOSIS — M5417 Radiculopathy, lumbosacral region: Secondary | ICD-10-CM | POA: Diagnosis not present

## 2023-04-02 DIAGNOSIS — M4310 Spondylolisthesis, site unspecified: Secondary | ICD-10-CM | POA: Diagnosis not present

## 2023-04-10 DIAGNOSIS — M76821 Posterior tibial tendinitis, right leg: Secondary | ICD-10-CM | POA: Diagnosis not present

## 2023-04-11 DIAGNOSIS — M5417 Radiculopathy, lumbosacral region: Secondary | ICD-10-CM | POA: Diagnosis not present

## 2023-04-15 ENCOUNTER — Ambulatory Visit: Payer: Medicare Other | Admitting: Internal Medicine

## 2023-04-17 ENCOUNTER — Telehealth: Payer: Self-pay

## 2023-04-17 ENCOUNTER — Telehealth: Payer: Self-pay | Admitting: *Deleted

## 2023-04-17 NOTE — Telephone Encounter (Signed)
 Copied from CRM [id number]. Topic: Clinical - Medication Question >> Apr 17, 2023  8:41 AM Isabell A wrote: Reason for CRM: Patient would like to pick up paperwork in regards to a medication trial that was discussed at her last office visit - misplaced the paper that she was given.

## 2023-04-17 NOTE — Telephone Encounter (Signed)
 Copied from CRM [id number]. Topic: Clinical - Medication Question >> Apr 17, 2023  8:41 AM Elizabeth Espinoza wrote: Reason for CRM: Patient would like to pick up paperwork in regards to Espinoza medication trial that was discussed at her last office visit - misplaced the paper that she was given.  Called and spoke with the pt  She states that the literature MR gave her at last ov regarding research trials was misplaced  She would like to pick up another copy  MR- please advise, thanks

## 2023-04-18 NOTE — Telephone Encounter (Signed)
 Please let her know the study that I had in mind which was an injection route has now closed for enrollment.  There is another pill study that I could evaluate her for.  We will inform her from the research team.

## 2023-04-18 NOTE — Telephone Encounter (Signed)
 Called and spoke with the pt and notified of response per MR  She verbalized understanding  Nothing further needed

## 2023-04-22 ENCOUNTER — Other Ambulatory Visit: Payer: Medicare Other

## 2023-04-22 NOTE — Telephone Encounter (Signed)
 Pelase have her call [facility] - [phone] and ask for Soledad Gerlach - and she can get consent on a study called "[id number]". I have copied Soledad Gerlach on this

## 2023-04-23 ENCOUNTER — Ambulatory Visit (INDEPENDENT_AMBULATORY_CARE_PROVIDER_SITE_OTHER): Admitting: Internal Medicine

## 2023-04-23 DIAGNOSIS — J019 Acute sinusitis, unspecified: Secondary | ICD-10-CM

## 2023-04-23 DIAGNOSIS — J84112 Idiopathic pulmonary fibrosis: Secondary | ICD-10-CM

## 2023-04-23 DIAGNOSIS — R0902 Hypoxemia: Secondary | ICD-10-CM

## 2023-04-23 DIAGNOSIS — J209 Acute bronchitis, unspecified: Secondary | ICD-10-CM

## 2023-04-23 LAB — PULMONARY FUNCTION TEST
DL/VA % pred: 63 %
DL/VA: 2.57 ml/min/mmHg/L
DLCO cor % pred: 50 %
DLCO cor: 9.99 ml/min/mmHg
DLCO unc % pred: 50 %
DLCO unc: 9.99 ml/min/mmHg
FEF 25-75 Pre: 1.71 L/s
FEF2575-%Pred-Pre: 118 %
FEV1-%Pred-Pre: 106 %
FEV1-Pre: 2.18 L
FEV1FVC-%Pred-Pre: 103 %
FEV6-%Pred-Pre: 109 %
FEV6-Pre: 2.85 L
FEV6FVC-%Pred-Pre: 104 %
FVC-%Pred-Pre: 104 %
FVC-Pre: 2.88 L
Pre FEV1/FVC ratio: 76 %
Pre FEV6/FVC Ratio: 99 %

## 2023-04-23 NOTE — Telephone Encounter (Signed)
 I called and spoke with the pt and notified of response per MR  She verbalized understanding and will give Soledad Gerlach a call  She will keep planned ov here 04/25/23

## 2023-04-23 NOTE — Progress Notes (Signed)
 Spirometry and DLCO Performed Today.

## 2023-04-23 NOTE — Patient Instructions (Signed)
 Spirometry and DLCO Performed Today.

## 2023-04-25 ENCOUNTER — Ambulatory Visit: Payer: Medicare Other | Admitting: Internal Medicine

## 2023-04-25 ENCOUNTER — Encounter: Payer: Self-pay | Admitting: Internal Medicine

## 2023-04-25 VITALS — BP 108/70 | HR 66 | Ht 65.5 in | Wt 139.0 lb

## 2023-04-25 DIAGNOSIS — J84112 Idiopathic pulmonary fibrosis: Secondary | ICD-10-CM | POA: Diagnosis not present

## 2023-04-25 DIAGNOSIS — Z79899 Other long term (current) drug therapy: Secondary | ICD-10-CM

## 2023-04-25 NOTE — Progress Notes (Signed)
 IOV 12/04/2017: Dr Delton Coombes: Elizabeth Espinoza is a 83 year old never smoker with a history of allergic rhinitis, GERD, hypothyroidism, MVP.  She is been seen in our office in the past by Dr. Shelle Iron for restless leg syndrome-she still treated for this with Requip. On allegra, nexium prn.   She is referred today for dyspnea.  She reports that she has had an exercise routine where she walked w her husband 2 miles, exercises at the [employer] [facility], for several years. She has noticed more SOB, especially with mild hills. Sometimes now has to stop briefly to complete the walk. Occasional chest tightness, no wheeze. No overt CP. She reports daily cough, often in the am, sometimes clear mucous. She is on allegra. She had a walking stress test 09/2014 > reassuring ECG w exercise. Weight has been stable. Her last TSH was 06/25/17, 0.73.   Review of her notes shows hx possible GGI on prior CT 2014.   ROV 02/26/18 [name] --83 year old never smoker with a history of mitral valve prolapse whom I saw earlier this year for exertional dyspnea and chronic cough.  Pulmonary function testing consistent with restriction.  She has GERD and allergic rhinitis both of which could be contributing to her chronic cough.  I performed a high-resolution CT scan of the chest on 1/20 and reviewed today, this shows some patchy confluent subpleural reticular disease and groundglass attenuation with some minimal traction bronchiectasis and no frank honeycomb change.  This reflects a progression compared with 2014 and is an NSIP pattern.  Walking oximetry at her last visit did not show any exertional desaturation. She is still coughing, is using fexofenadine, takes nexium qod.   [place], had pesticide exposure.   ROV 08/26/2018 [name] --follow-up visit for 83 year old woman with a history of mitral valve prolapse, chronic cough, dyspnea with restrictive lung disease noted on pulmonary function testing.  High Res CT scan of the chest  shows some mild interstitial disease.  She was also having cough - better with flonase; still on nexium, allegra. Her exertional tolerance is improved, she has been exercising more. She remains on Requip for RLS.   We performed autoimmune labs last time, ANA positive at low titer (1: 80), SSA and SSB negative, RNP negative, RF negative, CCP negative, Anti-Smith negative, anti-SCL negative, DS DNA negative, anti-Jo negative, aldolase negative. She hasn't been seen in ILD clinic yet.    OV 10/08/2018  Subjective:  Patient ID: Elizabeth Espinoza, female , DOB: Nov 14, 1940 , age 40 Elizabeth Espinoza.o. , MRN: [id number] , ADDRESS: [address] [place] [address]   10/08/2018 -   Chief Complaint  Patient presents with   Interstitial Lung Disease    Breathing the same as it was during August 2020 office visit with Dr. Delton Coombes     HPI Elizabeth Espinoza 34 Elizabeth Espinoza.o. -has been referred to the interstitial lung disease clinic because of findings of interstitial lung disease.  History is gathered from talking to her, review of Dr. Leslye Peer notes and also the integrated ILD questionnaire.  Briefly, she tells me that she was working out at [employer] and would just notice occasional dyspnea but she really did not compare it with other people.  Then in October 2019 she started walking 3 miles daily except on the days it rains with a husband.  During this time she noticed that she was falling really behind because of shortness of breath and exertional fatigue.  This resulted in subsequent evaluation all documented above.  Findings of interstitial lung disease with subpleural reticulation suggestive of an alternative diagnosis.  Autoimmune profile essentially negative other than trace positive ANA.  She tells me that her significant major problem is exertional dyspnea when walking stairs or walking several miles.  She did not desaturate in our office several months ago.  She does not know if she desaturates when she exerts  walking 3 miles.  Pulmonary function test earlier this year is just isolated low DLCO.  She also has like a cough.  Overall since the onset of the symptoms by exercising herself more and conditioning she is somewhat better.  She did see Dr. [name] in May 2020 and in June 2020 had a coronary calcium CT which appears to have no calcium deposits.   [facility] Integrated Comprehensive ILD Questionnaire  Symptoms:  -Dyspnea started suddenly and since it started it is better.  She says it is been present for years although she did tell me that it is only there since October 2019 when she noticed that.  Severity is listed below.  She does have a cough almost 1 year.  Since it started it is better.  It is mostly in the morning.  She does bring up some phlegm.  Early on in the morning it is green or yellow.  Since it started it is the same/better.  There is no wheezing.  She does have some chest tightness with this when she walks.  It is relieved by rest.  Cardiac work-up in June 2020 showed no calcium deposits.   Past Medical History : Positive for chronic longstanding acid reflux disease and thyroid disease..  In addition CT scan from January 2020 shows hiatal hernia that is small..  This presence of  sclerosis in the bony structures in January 2020.-Primary care physician has been sent a message today.  There is no asthma or COPD or heart failure rheumatoid arthritis or collagen vascular disease.  She does have GERD and hiatal hernia for several years to decades.  No sleep apnea.  No blood clots.  No hepatitis.  No tuberculosis.  No pleurisy.  , ANA positive at low titer (1: 80), SSA and SSB negative, RNP negative, RF negative, CCP negative, Anti-Smith negative, anti-SCL negative, DS DNA negative, anti-Jo negative, aldolase negative. She hasn't been seen in ILD clinic yet.    ROS:  -She does have fatigue for the last several years.  She does have some back and hip issues.  She does have dry eyes.  She  does have like some dysphagia.  There is presence of hiatal hernia-there is a small.  Acid reflux for several decades.  She also reports presence of nonspecific rash   FAMILY HISTORY of LUNG DISEASE: * -Her father died of mesothelioma in 39 83/1984 at the age of 36 otherwise no lung disease.   EXPOSURE HISTORY:   -When she was 17 she smokes cigarettes but otherwise no cigarette or tobacco use or electronic cigarette.  Never smoked marijuana.  No cocaine use no intravenous drug use.   HOME and HOBBY DETAILS :  -Single[place]family home [place] setting for the last 16 years in a 82 year old home.  No mold or mildew exposure in the [facility] duct or CPAP mask or humidifier.  No mold or mildew in the bathroom.  No pet birds in the house.  No misting [place].  No feather pillows no feather duvet.  No musical instruments.  She does some occasional  gardening which she likes.  She does do some fine-needle work.   OCCUPATIONAL HISTORY (122 questions) :  = Essentially negative except for the fact when she was a child she did some tobacco growing.  She has done home gardening for 50 years.    PULMONARY TOXICITY HISTORY (27 items):  denies  Results for KENZLEE, FISHBURN (MRN [id number]) as of 11/07/2018 11:20  Ref. Range 02/26/2018 12:17  Anti Nuclear Antibody (ANA) Latest Ref Range: NEGATIVE  POSITIVE (A)  ANA Pattern 1 Unknown Nuclear, Speckled (A)  ANA Titer 1 Latest Units: titer 1:80 (H)  Anti JO-1 Latest Ref Range: 0.0 - 0.9 AI <0.2  Cyclic Citrullin Peptide Ab Latest Units: UNITS <16  ds DNA Ab Latest Units: IU/mL <1  RA Latex Turbid. Latest Ref Range: <14 IU/mL <14  ENA SM Ab Ser-aCnc Latest Ref Range: <1.0 NEG AI <1.0 NEG  Ribonucleic Protein(ENA) Antibody, IgG Latest Ref Range: <1.0 NEG AI <1.0 NEG  SSA (Ro) (ENA) Antibody, IgG Latest Ref Range: <1.0 NEG AI <1.0 NEG  SSB (La) (ENA) Antibody, IgG Latest Ref Range: <1.0 NEG AI <1.0 NEG  Scleroderma (Scl-70) (ENA) Antibody, IgG Latest Ref Range: <1.0  NEG AI <1.0 NEG   Results for ANNE, BOLTZ (MRN [id number]) as of 11/07/2018 11:20  Ref. Range 01/29/2018 14:31  FVC-Pre Latest Units: L 2.97  FVC-%Pred-Pre Latest Units: % 98  Results for KENIYAH, GELINAS (MRN [id number]) as of 11/07/2018 11:20  Ref. Range 01/29/2018 14:31  DLCO unc Latest Units: ml/min/mmHg 14.80  DLCO unc % pred Latest Units: % 54    OV 11/07/2018  Subjective:  Patient ID: Elizabeth Espinoza, female , DOB: 1940-12-13 , age 43 Elizabeth Espinoza.o. , MRN: [id number] , ADDRESS: [address] [place] [address]   11/07/2018 -   Chief Complaint  Patient presents with   Follow-up    Patient reports that she has sob with any exertion.    Follow-up interstitial lung disease  HPI Elizabeth Espinoza 42 Elizabeth Espinoza.o. -last seen September 2020.  After that she was supposed to have follow-up high-resolution CT chest and spirometry DLCO.  For some reason the spirometry DLCO is not done.  In the interim her symptoms remain the same as shown by the symptom score below.  She does note when she does heavy exertion such as climbing stairs or long uphill walks her pulse ox drops to 84% but quickly regains.  She is not interested in portable oxygen.  She had a repeat high-resolution CT chest in September 2020 and when compared to January 2020 there is no significant change.  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  I personally visualized the film.  There is diffuse bilateral subpleural reticulation and some traction bronchiectasis.  I myself would say it may be this is indeterminate for UIP.  But there is some minimal air trapping and she is done some gardening work.  There is no upper zonal predominance that would fit in with hypersensitive pneumonitis.  She has incidental findings of bony sclerosis that is again repeated in the CT scan.  Her primary care physician Cheryll Cockayne evaluated her.  I reviewed the note.  She is been  referred to Dr. Jeanie Sewer in hematology who she is seeing November 12, 2018.  She is very confused about the fact that she has a bony sclerosis problem for which she is being referred to a blood doctor [hematologist] and she thought  she is having a blood test that was ordered by me.  I reviewed the chart and clarified this concepts   Ct Chest High Resolution  Result Date: 11/05/2018 CLINICAL DATA:  83 year old female with history of interstitial lung disease. Increased shortness of breath and cough over the past year. EXAM: CT CHEST WITHOUT CONTRAST TECHNIQUE: Multidetector CT imaging of the chest was performed following the standard protocol without intravenous contrast. High resolution imaging of the lungs, as well as inspiratory and expiratory imaging, was performed. COMPARISON:  High-resolution chest CT 02/10/2018. FINDINGS: Cardiovascular: Heart size is normal. There is no significant pericardial fluid, thickening or pericardial calcification. There is aortic atherosclerosis, as well as atherosclerosis of the great vessels of the mediastinum and the coronary arteries, including calcified atherosclerotic plaque in the left anterior descending and right coronary arteries. Mild calcifications of the mitral annulus. Mediastinum/Nodes: No pathologically enlarged mediastinal or hilar lymph nodes. Please note that accurate exclusion of hilar adenopathy is limited on noncontrast CT scans. Esophagus is unremarkable in appearance. No axillary lymphadenopathy. Lungs/Pleura: High-resolution images demonstrates some patchy areas of peripheral predominant septal thickening and subpleural reticulation, with mild cylindrical bronchiectasis and peripheral bronchiolectasis. No frank honeycombing confidently identified at this time. These findings have no definitive craniocaudal gradient. In the periphery of the mid to upper lungs there also some plaque-like areas of apparent pleuroparenchymal scarring and volume loss.  Inspiratory and expiratory imaging demonstrates minimal air trapping indicative of very mild small airways disease. Overall, these imaging findings appear stable compared to the prior study. Upper Abdomen: Aortic atherosclerosis. Musculoskeletal: Mild diffuse sclerosis throughout the visualized axial and appendicular skeleton, similar to prior examinations. There are no definite focal aggressive appearing lytic or blastic lesions noted in the visualized portions of the skeleton. IMPRESSION: 1. 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 2. Aortic atherosclerosis, in addition to 2 vessel coronary artery disease. Assessment for potential risk factor modification, dietary therapy or pharmacologic therapy may be warranted, if clinically indicated. 3. Persistent mild diffuse sclerosis throughout the visualized osseous structures without discrete aggressive appearing osseous lesions. Clinical correlation for signs and symptoms of potential infiltrative process such as myelofibrosis is suggested. Aortic Atherosclerosis (ICD10-I70.0). Electronically Signed   By: Trudie Reed M.D.   On: 11/05/2018 14:36     OV 12/22/2018  Subjective:  Patient ID: Elizabeth Espinoza, female , DOB: 1941-01-18 , age 37 Elizabeth Espinoza.o. , MRN: [id number] , ADDRESS: [address] [place] [address]   12/22/2018 -   Chief Complaint  Patient presents with   Follow-up    Pt states she has been doing good since last visit. Pt is still coughing and will get up clear mucus in the morning.   Follow-up interstitial lung disease with CT scan October 2020 being indeterminate versus alternate diagnosis.  History of gardening.  Trace  autoimmune ANA positive.  Mild progression since 2014  HPI Elizabeth Espinoza 83 Elizabeth Espinoza.o. -returns for follow-up.  She presents with her husband who I am meeting for the first time.  In the interim she met Dr. Dorris Fetch thoracic surgeon for surgical lung biopsy.  Her husband also met with him.  I reviewed the note.  She tells me that given the morbidity with surgical lung biopsy she  wants to undergo bronchoscopy with lavage and transbronchial biopsy first.  She understands the inherent limitations of these procedure in terms of diagnosis.  But she wants to take the lower risk profile.  Overall she feels stable.  Her symptom score is improved compared to the past.  Her walking desaturation test shows exaggerated drop in pulse ox but this was done with her wearing the mask.  She did not feel any dyspnea.  In terms of her bony sclerosis she has seen Dr. Leonides Schanz at [facility].  I reviewed the note.  He has reassured her.  Risks of pneumothorax, hemothorax, sedation/anesthesia complications such as cardiac or respiratory arrest or hypotension, stroke and bleeding all explained. Benefits of diagnosis but limitations of non-diagnosis also explained. Patient verbalized understanding and wished to proceed.   They want to have the bronchoscopy after the holidays of Christmas and new year.  This is because their house is undergoing remodeling currently.       OV 03/16/2019  Subjective:  Patient ID: Elizabeth Espinoza, female , DOB: [birth year] , age 66 Elizabeth Espinoza.o. , MRN: [id number] , ADDRESS: [address] [place] [address]   03/16/2019 -   Chief Complaint  Patient presents with   Follow-up    PFT performed today.  Pt states she has been doing okay since last visit and states her breathing is about the same.     Finally able to review esults of envisia send out test to [place]. Date of test is 02/05/2019. 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 -returns for follow-up to discuss  bronchoscopy and lavage results.  In the interim no new respiratory issues.  They are all stable.  However she is having significant musculoskeletal issues with shoulder pain.  Serology from a year ago was negative.  They wanted to know about relatedness.  I told him it is probably not related.  We went over the bronchoscopy lavage results which showed some dominance of neutrophils consistent with UIP.  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   We had a long discussion about the benefits, risks and limitations of antifibrotic therapy.  We also discussed the choice between pirfenidone and nintedanib.       Results for MALIE, KASHANI (MRN [id number]) as of 03/16/2019 11:12  Ref. Range 01/29/2018 14:31 03/16/2019 08:42  FVC-Pre Latest Units: L 2.97 2.91  FVC-%Pred-Pre Latest Units: % 98 97   Results for GEETIKA, LABORDE (MRN [id number]) as of 03/16/2019 11:12  Ref. Range 01/29/2018 14:31 03/16/2019 08:42  DLCO unc Latest Units: ml/min/mmHg 14.80 12.32  DLCO unc % pred Latest Units: % 54 60     OV 06/11/2019 - telephine visit. Patient identified with 2 pHI, risks, benefit, limitations of tele visit explained  Subjective:  Patient ID: Elizabeth Espinoza, female , DOB: 24-Mar-1940 , age 65 Elizabeth Espinoza.o. , MRN: [id number] , ADDRESS: [address] [place] [address] ate of test is 02/05/2019. 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 -in this telephone visit patient is now on pirfenidone.  She says she is on pirfenidone since mid April 2021.  She is on 3 pills 3 times a day.  She  spacing them 4 hours apart but having some intermittent nausea every few days.  She did have an urticaria but that was before she started pirfenidone was related to Covid vaccine  that is now resolved.  She is asking about exercising at the [facility] and Covid precautions.  I advised her because she is vaccinated that the risk is low but not 0 and to take adequate precautions as tolerated.  She had liver function test and this is normal.  Her next appointment for pulmonary function test is in mid July 2021.  Recommended she make a face-to-face visit with me at that time.   OV 08/21/2019   Subjective:  Patient ID: Elizabeth Espinoza, female , DOB: [birth year], age 55 Elizabeth Espinoza.o. years. , MRN: [id number],  ADDRESS: [address] [place] [address] PCP  Pincus Sanes, MD Providers : Treatment Team:  Attending Provider: Kalman Shan, MD  Type of visit: Telephone Circumstance: COVID-19 national emergency Identification of patient Elizabeth Espinoza - 2 person identifier Risks: Risks, benefits, limitations of telephone visit explained Patient location: home This provider location: [facility]      Chief Complaint  Patient presents with   Follow-up    PFT 7/27--c/o sob with stairs and throat clearing mainly in the morning. stopped Esbriet on 08/12/2019.   Follow-up interstitial lung disease with CT scan October 2020 being indeterminate versus alternate diagnosis.  History of gardening.  Trace autoimmune ANA positive.  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 -presents for this telephone visit for IPF.  On this telephone visit she was identified with 2 person identifier.  Risks, benefits and limitations of telephone visit explained.  After last visit her GI symptoms worsen.  She did see Dr. Camillia Herter gastroenterologist.  She underwent endoscopy.  Dr. Loreta Ave did send the results to me I do not have this.  I remember Dr. Loreta Ave calling me and discussing  patient's GI side effects.  These were from pirfenidone so on August 12, 2019 based on my advise she start pirfenidone.  Since then her symptoms of GI nature have resolved.  Her respiratory symptoms continue to be stable.  These are all documented below.  At this point in time she feels that her quality of life is very important.  She does not want to go through the kind of GI side effect she went through with pirfenidone.  She would rather deal with the disease.  She is aware that nintedanib is a standard of care therapeutic option.  She is also aware after discussion the clinical trials as a care option particularly in the past to discovering new therapies that do not have this GI side effects that pirfenidone poses.  She had a lot of questions about Covid vaccine.  She is fully vaccinated.  She is worried about her grandson was refusing the vaccine.      OV 09/29/2019   Subjective:  Patient ID: Elizabeth Espinoza, female , DOB: Dec 01, 1940, age 51 Elizabeth Espinoza.o. years. , MRN: [id number],  ADDRESS: [address] [place] [address] PCP  Pincus Sanes, MD Providers : Treatment Team:  Attending Provider: Kalman Shan, MD   Chief Complaint  Patient presents with   Follow-up    LFT today?  pulmonary rehab or  exercises.  inhaler trial, felt better after Advair.   Follow-up interstitial lung disease/IPF with CT scan October 2020 being indeterminate versus alternate diagnosis.  History of gardening.  Trace autoimmune ANA positive.  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 Date of test is 02/05/2019. 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  -Date of giving diagnosis is March 16, 2019  - MDD 05/05/19  - esbriet since 05/06/19- stopped 08/12/19 due to severe GI side effect    HPI Elizabeth Espinoza 38 Elizabeth Espinoza.o. -    presesnts  With her husband for IPF followup.  Overall she is doing  well.  She is not taking pirfenidone since July 2021.  After that she has gained weight.  All her GI symptoms have resolved.  She goes for daily walks.  She says when she reaches the top of the hill sometimes she desaturates to 88% but not always.  She monitors this closely.  She is asking for other exercises that can improve her endurance and lung function.  We discussed that exercises do not improve lung function.  She is willing to participate in pulmonary rehabilitation.  We discussed the alternative of taking nintedanib but because of the severe GI side effects that she had with Esbriet she is nervous about starting nintedanib and does not want to.  I introduced her to the concept of clinical trials as a care option.  At this point in time she wants to process this information.  We did discuss the fact that IPF is a progressive disease and antifibrotic's are indicated early in the course of the disease.  She prefers to have supportive care approach.  She wants to focus on strengthening her overall physical shape.   OV 06/30/2021 -standard of care visit in this research patient.  Subjective:  Patient ID: Elizabeth Espinoza, female , DOB: 08/20/1940 , age 81 Elizabeth Espinoza.o. , MRN: [id number] , ADDRESS: [address] [place] [address] PCP Pincus Sanes, MD Patient Care Team: Pincus Sanes, MD as PCP - General (Internal Medicine) Jens Som Madolyn Frieze, MD as Consulting Physician (Cardiology) Leslye Peer, MD as Consulting Physician (Pulmonary Disease) Kathyrn Sheriff, [facility] as Pharmacist (Pharmacist) Christia Reading, MD as Consulting Physician (Otolaryngology) [facility] as Consulting Physician (Ophthalmology)  This Provider for this visit: Treatment Team:  Attending Provider: Kalman Shan, MD    06/30/2021 -   Chief Complaint  Patient presents with   Follow-up    Pt recently had a CT of her sinuses and is here to discuss the results.  Pt states she has not been  feeling well and states she is still having problems with her sinuses. States her breathing has been doing okay.     HPI Elizabeth Espinoza 58 Elizabeth Espinoza.o. -on April 19, 2021 she did have some side effects from inhaled medical product.  After that we reduce the dose and slowly escalated back and then she was tolerating it fine.  On 05/22/2021 she was prescribed antibiotics for some sinus issues she did have week 8 study visit on 06/01/2021 and was doing okay.  But then on 06/20/2021 she started complaining of headache and sinus congestion and coughing and blowing yellowish mucus.  At this point in time she was taking inhaled treprostinil versus placebo investigational medical product at 4 times daily each time 12 puffs.  We told her to reduce it to 9 puffs 4 times daily.  She tells me that perhaps she is marginally better after reducing the dose but she still having headaches.  She feels like her head is full particularly in the top part.  She also has significant cough.  She says during and after she takes the inhaled medical investigational product her cough score is 9 out of 10.  Rest of the day it is around 3 out of 10.  She coughs so much that she sometimes brings out mild yellow mucus that is like glue.  There is no diarrhea fatigue or weight loss or syncope or hemoptysis.  Definitely all the symptoms only after starting the research protocol.  She also some sore throat.  She has tried some remedial measures but these have not helped.  These include yogurt and ginger ale  For the dyspnea itself she feels stable.  She also tells me that she is able to walk a mile.  She is able to outplace her husband.  She feels her husband's declining health.  She says when she walks a mile she puts a finger probe after she rests and the pulse ox is 94%.  She is worried she might desaturate below 88%.  She does not have a forehead pulse oximeter.  She says the finger pulse ox meter does not stay on while she is walking.  I gave her a  contact of the patient support group leader to figure out a way to get a forehead probe.  Also showed her various finger proximal she can get that can stay on her finger while she exercises.  Told her the safety limit is greater than 88%.  Symptom scores below show that her symptoms are better than 2 years ago slightly.  This is from a respiratory standpoint with a cough is definitely worse after she went on the study.  And the headache is new.  She had a CT scan sinus that shows chronic findings.      OV 05/04/2022  Subjective:  Patient ID: Elizabeth Espinoza, female , DOB: Mar 06, 1940 , age 80 Elizabeth Espinoza.o. , MRN: [id number] , ADDRESS: [address] [place] [address] PCP Pincus Sanes, MD Patient Care Team: Pincus Sanes, MD as PCP - General (Internal Medicine) Jens Som Madolyn Frieze, MD as Consulting Physician (Cardiology) Leslye Peer, MD as Consulting Physician (Pulmonary Disease) Kathyrn Sheriff, [facility] as Pharmacist (Pharmacist) Christia Reading, MD as Consulting Physician (Otolaryngology) [facility] as Consulting Physician (Ophthalmology)  This Provider for this visit: Treatment Team:  Attending Provider: Kalman Shan, MD  05/04/2022 -   Chief Complaint  Patient presents with   Follow-up    F/u after researched      HPI Elizabeth Espinoza 44 Elizabeth Espinoza.o. -returns for routine follow-up.  In the interim she was on inhaled treprostinil versus placebo but she did not tolerate it at all.  She had all the classic side effects of the renal drug.  We made multiple attempts in holiday and giving the drug back but she did not tolerate it at all and she finally quit.  She is out of the study now.  She did do 1 year follow-up and exited the study.  Currently she is on supportive care.  She walks on Monday through Friday 3 miles a day.  This is through [facility] and she has gotten the best walker award.  She does have some chest burning when she  starts the walking but it  does get better as she walks further.  She is not limited by dyspnea.  She checks her pulse ox after the walk a few seconds later and the pulse ox is 93% 94% at the lowest.  I did tell her to monitor it while she walks.  In terms with therapy she is not interested in standard of care antifibrotic therapy because of side effect profile.  She tried pirfenidone in the past.  She is also been intolerant to treprostinil.  She is absolutely not interested in nintedanib.  We talked about doing the revert study sponsored by [facility].  Is an oral pill.  She read the consent form which we gave it to her some weeks ago.  She does not want to do the study because of side effect profile.  There is another study called MOONSCAPE by [place].  Is an injection study.  Current patients are tolerating the study well so far.  She might be interested in this..  Will probably aim to recruit and Q3  2024.    OV 09/17/2022  Subjective:  Patient ID: Elizabeth Espinoza, female , DOB: 1940/06/21 , age 2 Elizabeth Espinoza.o. , MRN: [id number] , ADDRESS: [address] [place] [address] PCP Pincus Sanes, MD Patient Care Team: Pincus Sanes, MD as PCP - General (Internal Medicine) Jens Som Madolyn Frieze, MD as PCP - Cardiology (Cardiology) Jens Som Madolyn Frieze, MD as Consulting Physician (Cardiology) Leslye Peer, MD as Consulting Physician (Pulmonary Disease) Kathyrn Sheriff, [facility] (Inactive) as Pharmacist (Pharmacist) Christia Reading, MD as Consulting Physician (Otolaryngology) [facility] as Consulting Physician (Ophthalmology)  This Provider for this visit: Treatment Team:  Attending Provider: Kalman Shan, MD    09/17/2022 -   Chief Complaint  Patient presents with   Follow-up    Breathing is about the same. She denies any new co's. She is walking 3 miles daily.    HPI Elizabeth Espinoza 2 Elizabeth Espinoza.o. -presents for follow-up.  She is currently on supportive care.  She started off the visit by saying  she was doing stable but later she did admit that when she does a 3 mile walk sometimes she gets dizziness but she is not checking her pulse ox but she is still able to finish her 3 miles at the same time.  She also when she does vacuuming she gets quite dyspneic.  No new medical problems other than the fact her leg and feet have chronic numbness and associated with restless legs. Interim Health status: No new complaints No new medical problems. No new surgeries. No ER visits. No Urgent care visits. No changes to medications.  Symptom scores and x-rays hypoxemia test remained stable but a pulmonary function test today shows a drop in DLCO.  We discussed this.  In terms of treatment she is only on supportive care after having failed antifibrotic's and previous intolerance to inhaled treprostinil study drug.  She is looking at the injection based therapy via [employer] protocol.       OV 10/23/2022  Subjective:  Patient ID: Elizabeth Espinoza, female , DOB: 1940/05/06 , age 78 Elizabeth Espinoza.o. , MRN: [id number] , ADDRESS: [address] [place] [address] PCP Pincus Sanes, MD Patient Care Team: Pincus Sanes, MD as PCP - General (Internal Medicine) Jens Som Madolyn Frieze, MD as PCP - Cardiology (Cardiology) Jens Som Madolyn Frieze, MD as Consulting Physician (Cardiology) Leslye Peer, MD as Consulting Physician (Pulmonary Disease) [facility], RPH (Inactive) as Pharmacist (Pharmacist) Christia Reading, MD as Consulting Physician (Otolaryngology) [facility] as Consulting Physician (Ophthalmology)  This Provider for this visit: Treatment Team:  Attending Provider: Kalman Shan, MD    10/23/2022 -   Chief Complaint  Patient presents with   Follow-up    Ct scan f/u      HPI Elizabeth Espinoza 35 Elizabeth Espinoza.o. -returns for follow-up.  She continues a 3 mile walks.  She feels that the exertional shortness of breath that she was having during the 3 mile walk is somewhat better.   Reviewing of the symptom score tells me that she has been stable overall and it is hard to discern.  The big concern at last visit was a drop in pulmonary function test especially DLCO.  There was concern about pulmonary hypertension versus pulmonary fibrosis worsening so so we did BNP and this was elevated increasing concern for pulmonary hypertension but the echo shows only grade 1 diastolic dysfunction normal RV.  Were also concerned about worsening pulmonary fibrosis we did a high-resolution CT chest and radiology feels it has been stable for 2 years.  Stability in lung function is confirmed by stability and symptom score and also exercise hypoxemia test.  The big change she has there is a pulmonary function test.  This could be a red herring based on variations.  Nevertheless the BNP was high.  Therefore we will recheck this today.  She did mention to me that 7 or 10 days ago she did have an episode of palpitations while doing a 3 mile walk heart rate was 170 on the Fitbit and she had to stop.  No known atrial fibrillation diagnosis.  Same thing happened 2 months ago but she forgot to mention this to Dr. Jens Som on her visit early August 2024.  I have reached out to him via electronic message.  In terms of her pulmonary fibrosis therapy she is interested in other therapies that have minimal side effect profile.  The approved and antifibrotic's all have side effect profile.  She is interested experimental protocol.  We went over VIXARELIMAB monoclonal antibody given every few weeks.  There are some interesting data with prurigo.  I showed her that in the side effect profile.  She is taken the consent form.  She is interested in this at this point.     CT Chest data from date: 09/20/22  Narrative & Impression  CLINICAL DATA:  83 year old female with history of interstitial lung disease. Follow-up study.   EXAM: CT CHEST WITHOUT CONTRAST   TECHNIQUE: Multidetector CT imaging of the chest was  performed following the standard protocol without intravenous contrast. High resolution imaging of the lungs, as well as inspiratory and expiratory imaging, was performed.   RADIATION DOSE REDUCTION: This exam was performed according to the departmental dose-optimization program which includes automated exposure control, adjustment of the mA and/or kV according to patient size and/or use of iterative reconstruction technique.   COMPARISON:  High-resolution chest CT 11/14/2020.   FINDINGS: Cardiovascular: Heart size is normal. There is no significant pericardial fluid, thickening or pericardial calcification. There is aortic atherosclerosis, as well as atherosclerosis of the great vessels of the mediastinum and the coronary arteries, including calcified atherosclerotic plaque in the left anterior descending and right coronary arteries. Mild calcification of the mitral annulus.   Mediastinum/Nodes: No pathologically enlarged mediastinal or hilar lymph nodes. Please note that accurate exclusion of hilar adenopathy is limited on noncontrast CT scans. Esophagus is  unremarkable in appearance. No axillary lymphadenopathy.   Lungs/Pleura: Widespread areas of peripheral predominant septal thickening, subpleural reticulation, scattered mild cylindrical bronchiectasis and peripheral bronchiolectasis are again noted throughout the lungs bilaterally. No definite honeycombing noted. No discernible craniocaudal gradient. Overall, these findings appear essentially unchanged compared to the prior study. Inspiratory and expiratory imaging is unremarkable. No acute consolidative airspace disease. No definite suspicious appearing pulmonary nodules or masses are noted. Extensive bilateral apical pleuroparenchymal thickening and architectural distortion, similar to the prior study.   Upper Abdomen: Aortic atherosclerosis.   Musculoskeletal: There are no aggressive appearing lytic or blastic lesions  noted in the visualized portions of the skeleton.   IMPRESSION: 1. 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 Given the long-term stability, an alternative diagnosis such as fibrotic phase nonspecific interstitial pneumonia (NSIP) should also be considered. 2. Aortic atherosclerosis, in addition to left anterior descending and right coronary artery disease. Please note that although the presence of coronary artery calcium documents the presence of coronary artery disease, the severity of this disease and any potential stenosis cannot be assessed on this non-gated CT examination. Assessment for potential risk factor modification, dietary therapy or pharmacologic therapy may be warranted, if clinically indicated.   Aortic Atherosclerosis (ICD10-I70.0).     Electronically Signed   By: Trudie Reed M.D.   On: 09/29/2022 12:00      Latest Reference Range & Units 09/17/22 12:05  Pro B Natriuretic peptide (BNP) 0.0 - 100.0 pg/mL 129.0 (H)  (H): Data is abnormally high   ECHO 09/18/22   IMPRESSIONS     1. Left ventricular ejection fraction, by estimation, is 55 to 60%. The  left ventricle has normal function. The left ventricle has no regional  wall motion abnormalities. Left ventricular diastolic parameters are  consistent with Grade I diastolic  dysfunction (impaired relaxation).   2. Right ventricular systolic function is normal. The right ventricular  size is normal.   3. Left atrial size was mildly dilated.   4. The mitral valve is normal in structure. Trivial mitral valve  regurgitation. No evidence of mitral stenosis.   5. The aortic valve is tricuspid. There is mild calcification of the  aortic valve. Aortic valve regurgitation is trivial. Aortic valve  sclerosis/calcification is present, without any evidence of aortic  stenosis.   6. The  inferior vena cava is normal in size with greater than 50%  respiratory variability, suggesting right atrial pressure of 3 mmHg.     OV 02/21/2023  Subjective:  Patient ID: Elizabeth Espinoza, female , DOB: March 03, 1940 , age 43 Elizabeth Espinoza.o. , MRN: [id number] , ADDRESS: [address] [place] [address] PCP Pincus Sanes, MD Patient Care Team: Pincus Sanes, MD as PCP - General (Internal Medicine) Jens Som Madolyn Frieze, MD as PCP - Cardiology (Cardiology) Jens Som Madolyn Frieze, MD as Consulting Physician (Cardiology) Leslye Peer, MD as Consulting Physician (Pulmonary Disease) Kathyrn Sheriff, [facility] (Inactive) as Pharmacist (Pharmacist) Christia Reading, MD as Consulting Physician (Otolaryngology) [facility] as Consulting Physician (Ophthalmology)  This Provider for this visit: Treatment Team:  Attending Provider: Kalman Shan, MD    02/21/2023 -   Chief Complaint  Patient presents with   Follow-up    Pt states she still has a ongoing productive cough ( green phlegm ) states she is feeling stronger and less fatigue.     Marland Kitchen  HPI  Elizabeth Hey Scheibel 77 Elizabeth Espinoza.o. -returns for follow-up.  At this follow-up visit she is admitting that she is desaturating with exertion.  She notices when she works out on her exercise bike she goes down to 89 or 88%.  She stopped going to the [facility] because of pulse oximeter is alarming when she gets to 88% and she is doing it easily.  She says at that time she does not necessarily feel short of breath although at later time point she did admit for shortness of breath but overall symptom questionnaire shows continued stability but she is definitely desaturating.  She continues to have tachycardia review of external records indicate she is having SVTs.  Cardiology tried Lopressor but she did not tolerate.  She has a historical tolerance issues with many medications.  She did tolerate the oral pill versus placebo on [place] study but she did not  tolerate standard of care Esbriet.  She did not tolerate inhaled treprostinil versus placebo in a study which we think she got the actual study drug..  Today exercise hypoxemia test shows worsening she is desaturating to 88%.  This something she had not done with similar exertion in the past.  I had a frank conversation with her I did indicate to her that I am really concerned the disease is progressive.  Indicated to her that she should strongly consider trying standard of care nintedanib.  I went over the diarrhea side effects.  She is apprehensive of the side effect but also concerned about the $2000 co-pay cost.  I told her this is standard under the new inflation reduction act.  She is still processing the information.  I told her that the advent of exercise hypoxemia is a negative prognostic sign.  Told her to consider at least option B  which is to get into a clinical trial with injection monoclonal antibody Vixarelimab versus placebo.  So far all patients are tolerating it well.  Short of the side effect profile.  She already took the consent last time but did not read it.  We are giving her the consent again.  Told her she can add standard of care nintedanib once she starts the study.  Least preferred option is to continue supportive care.   For her quality of life:  - recommend portoabl o2 - test ono      OV 03/14/2023  Subjective:  Patient ID: Elizabeth Espinoza, female , DOB: [birth year] , age 57 Elizabeth Espinoza.o. , MRN: [id number] , ADDRESS: [address] [place] [address] PCP Pincus Sanes, MD Patient Care Team: Pincus Sanes, MD as PCP - General (Internal Medicine) Jens Som Madolyn Frieze, MD as PCP - Cardiology (Cardiology) Jens Som Madolyn Frieze, MD as Consulting Physician (Cardiology) Leslye Peer, MD as Consulting Physician (Pulmonary Disease) Kathyrn Sheriff, [facility] (Inactive) as Pharmacist (Pharmacist) Christia Reading, MD as Consulting Physician (Otolaryngology) [facility] as Consulting Physician (Ophthalmology)  This Provider for this visit: Treatment Team:  Attending Provider: Kalman Shan, MD    03/14/2023 -   Chief Complaint  Patient presents with   Follow-up    Follow-up interstitial lung disease/IPF with CT scan October 2020 being indeterminate versus alternate diagnosis.  History of gardening.  Trace autoimmune ANA positive.  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 Date of test is 02/05/2019. Result is  POSITIVE FOR UIP  -Date of giving diagnosis is March 16, 2019  - MDD 05/05/19  - esbriet since 05/06/19- stopped 08/12/19 due to severe GI side effect   IPF patient on  -No approved antifibrotic based on personal choice and side effect profile -On inhaled treprostinil versus placebo [[place]] -phase 3 trial  -Consent February 2023  -Randomization April 06 2021 -> QUIT early due to side effects (presumably got real drug) -> completed 1 year follow-up spring 2024  HPI Elizabeth Espinoza 77 Elizabeth Espinoza.o. -presents for acute visit follow-up.  After I saw her last time she had acute bronchitis and was desaturating ended up in the ER last Thursday had to call an antibiotic and prednisone.  She now presents with her husband.  She says she is a lot better but only 50% better.  She still has otalgia sore throat chest being sore coughing colored sinus drainage.  She is finished with her doxycycline she still has a few days of prednisone left.  She wants a repeat course of antibiotic.  She says that she still desaturates at home walking 50 feet.  Of note last time we prescribed portable oxygen but it appears that she also got a oxygen concentrator at home.  She had a overnight pulse oximetry study and in this there is no desaturations past 4-1/2 minutes.  She is slightly bradycardic but she is on Lopressor.  Therefore  I did advise her that she could return her concentrator and just retain her portable oxygen system.  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   OV 04/25/2023  Subjective:  Patient ID: Elizabeth Espinoza, female , DOB: Nov 20, 1940 , age 51 Elizabeth Espinoza.o. , MRN: [id number] , ADDRESS: [address] [place] [address] PCP Pincus Sanes, MD Patient Care Team: Pincus Sanes, MD as PCP - General (Internal Medicine) Jens Som Madolyn Frieze, MD as PCP - Cardiology (Cardiology) Jens Som Madolyn Frieze, MD as Consulting Physician (Cardiology) Leslye Peer, MD as Consulting Physician (Pulmonary Disease) Kathyrn Sheriff, [facility] (Inactive) as Pharmacist (Pharmacist) Christia Reading, MD as Consulting Physician (Otolaryngology) [facility] as Consulting Physician (Ophthalmology)  This Provider for this visit: Treatment Team:  Attending Provider: Kalman Shan, MD    04/25/2023 -   Chief Complaint  Patient presents with   Follow-up    Breathing is doing well and she denies any co's today.     HPI Elizabeth Espinoza 39 Elizabeth Espinoza.o. -returns for follow-up.  She states since the sinusitis she has improved.  She feels she no longer needs portable oxygen.  At the [facility] her husband states that she is walking a little slower but she does not desaturate.  A technician there help monitor her pulse ox when she walked 2 miles and she did not desaturate below 90%.  She feels back to baseline.  Symptom score have improved.  Exercise hypoxemia test here is stable.  She wanted to discuss next step in therapies.  She called the office a few days ago wanting to see a research consent.  She then met with the research coordinator yesterday and took to consent form for short-term phase 2, 20-28 weeks oral drug studies.  When she first came  into the office she said she was going to do the study at least one of them.  Then later she said she wanted to do approved antifibrotic nintedanib.  In the past she had tried pirfenidone but failed and swore because of the side effect she will never take an approved antifibrotic.  We then discussed nintedanib explained to her this is standard of care.  Explained to her that patient should always consider standard of care therapies first.  Explained to her that there is a 10-year safety profile on the medication.  Explained to her monitoring standards.  She then agreed to do nintedanib but just as I wrote the instructions she changed her mind.  She and her husband expressed cost concerns.  We went over the inflation reduction act and her co-pay.  She reflected on this.  She then wanted to know if she could do the study given the fact she was stable and then take approved antifibrotic's few to several weeks after starting a research protocol.  She feels that she will benefit from 2 medications.  We went over therapeutic misconception.  Explained to her that the purpose of the study especially phase 2 is to improve society and help develop new medications.  She believes will get monitoring with the study.  I agreed with this.  At the end she is going to reflect on all this.  She is to research concerns to look at.  We are going to check with the sponsor if she can get the study drug and then start approved antifibrotic.  She will also reflect if she wants to take the approved antifibrotic first and then do the study when she is stable on it for a few months.     SYMPTOM SCALE - ILD     Inhaled Tyvaso v Placebo. 05/04/2022 Supportive care  09/17/2022 Supporitve care 10/23/2022 Supportive care 02/21/2023 Supportive care 03/14/2023 Acute bronchitis sinusitis otherwise supportive care.  Just completed doxycycline.  Is on prednisone taper. 04/25/2023   O2 use 10/08/2018  11/07/2018  12/22/2018  06/30/21         Shortness of Breath 0 -> 5 scale with 5 being worst (score 6 If unable to do)  ra ra ra ra ra ra ra  At rest 0 0 0 0 0 0 0 5 0  Simple tasks - showers, clothes change, eating, shaving 1 1 0 1 1 1.5 1 4 1   Household (dishes, doing bed, laundry) 2 2 2 3 1  for mos stuff to 5 with vaccuming (3) 1.5 (4 vaccuum) 1.5 3 4.5  Shopping 2 2 0.5 2 2 2 3  x 2.5  Walking level at own pace 2 1 0.5 1 1 1  x 4 1  Walking up Stairs 4 5 3  2.5 3.5 2.5 3.5 5 2   Total (40 - 48) Dyspnea Score 11 11 6  9.5 9.5 8.5 9 21 11   How bad is your cough? 1 3 2.5 5 3 2  3.5 5 0  How bad is your fatigue 2 2.5 1.5 am, 4.5 pm 4 2 2.5 4.5 4 1   nausea    1 0 0 1 0 0  vomit    0 00 0 0 00 0  diarrhea    0 0 0 0 0 0  anxiet    00 0 0 0 0 0  depression    0 0 0 0 0 0  pain    [employer]acne,, sinus complaints  0 0 0 0   Simple office walk 185 feet x  3 laps goal with forehead probe 06/30/2021  09/17/2022  10/23/2022  02/21/2023  02/21/2023  03/14/2023  04/25/2023   O2 used ra ra ra ra ra ra ra  Number laps completed 3 Sit stand x 10 Sit stand x 15   Sit stand x 15 Sit and stnd x 15  Comments about pace Fast pace  moderate   1 Tii 1 min and 2 sec  Resting Pulse Ox/HR 96% and 74/min 96% and HR 62 97% and HR 75 100% and HR 69 100% n 100% and HR 100 %and  70     98% and HR 89 88% and HR 103 96% after 2 LAPS on 2 LNC  95% and HR 82  Final Pulse Ox/HR 96% and 117/min 94% and HR 77 94% and HR 73      Desaturated </= 88% no no no      Desaturated <= 3% points n no yes      Got Tachycardic >/= 90/min yes no no      Symptoms at end of test No complaints No compalints No complaints    Level 5 dyspnea at peak  Miscellaneous comments Sx             PFT     Latest Ref Rng & Units 04/23/2023    7:54 AM 02/18/2023   11:05 AM 09/17/2022   10:11 AM 03/04/2020    1:00 PM 09/15/2019    5:13 PM 03/16/2019    8:42 AM 01/29/2018    2:31 PM  PFT Results  FVC-Pre L 2.88  P 2.77  2.78  3.07  2.92  2.91  2.97   FVC-Predicted Pre % 104  P 102  103  104  99   97  98   FVC-Post L       2.89   FVC-Predicted Post %       95   Pre FEV1/FVC % % 76  P 77  78  78  80  79  81   Post FEV1/FCV % %       83   FEV1-Pre L 2.18  P 2.12  2.18  2.39  2.34  2.29  2.40   FEV1-Predicted Pre % 106  P 106  108  108  106  102  105   FEV1-Post L       2.39   DLCO uncorrected ml/min/mmHg 9.99  P 9.27  8.80  12.94  12.84  12.32  14.80   DLCO UNC% % 50  P 47  45  63  63  60  54   DLCO corrected ml/min/mmHg 9.99  P 9.27  8.80  12.94  13.26  12.91    DLCO COR %Predicted % 50  P 47  45  63  65  63    DLVA Predicted % 63  P 57  62  70  71  73  68     P Preliminary result       LAB RESULTS last 96 hours No results found.       has a past medical history of Allergic rhinitis, GERD (gastroesophageal reflux disease), Glaucoma, Hiatal hernia, Hyperlipidemia, Hypothyroidism, ILD (interstitial lung disease) (HCC), Lichen planopilaris, MVP (mitral valve prolapse), Restless legs, and Urticaria.   reports that she has never smoked. She has never used smokeless tobacco.  Past Surgical History:  Procedure Laterality Date   APPENDECTOMY     with TAH   CARDIAC CATHETERIZATION  1989   negative   COLONOSCOPY  2005   Dr Juanda Chance   COLONOSCOPY  01/2013   Dr Loreta Ave   esophageal dilation  07/04/2011   Dr Neita Garnet SIGMOIDOSCOPY  1999    Dr Juanda Chance   KNEE ARTHROSCOPY  2001, 2005   Dr Eulah Pont   bilat   TOTAL ABDOMINAL HYSTERECTOMY     for Endometriosis; no BSO, Dr Arletha Grippe   UPPER GI ENDOSCOPY  01/2013   Dr Loreta Ave   VIDEO BRONCHOSCOPY Bilateral 02/05/2019   Procedure: VIDEO BRONCHOSCOPY WITH FLUORO;  Surgeon: Kalman Shan, MD;  Location: [facility] ENDOSCOPY;  Service: Endoscopy;  Laterality: Bilateral;    Allergies  Allergen Reactions   Caffeine     palpitations   Chlorpheniramine-Pseudoeph     palpitations   Maxifed     palpitations   Other Other (See Comments)   Pirfenidone Nausea Only    Esbriet Causes nausea and weakness    Immunization History   Administered Date(s) Administered   Fluad Quad(high Dose 65+) 11/15/2020, 11/14/2021, 11/23/2022   Influenza Split 11/07/2010   Influenza Whole 10/30/2009, 11/27/2011   Influenza, High Dose Seasonal PF 10/27/2012, 11/05/2013, 10/01/2017, 09/11/2018, 11/23/2019   Influenza-Unspecified 11/24/2014, 10/07/2015, 10/08/2016   PFIZER(Purple Top)SARS-COV-2 Vaccination 03/14/2019, 04/06/2019, 10/27/2019   Pneumococcal Conjugate-13 10/07/2015   Pneumococcal Polysaccharide-23 07/02/2017   Respiratory Syncytial Virus Vaccine,Recomb Aduvanted(Arexvy) 12/27/2021   Tdap 02/11/2012   Zoster Recombinant(Shingrix) 10/01/2017, 12/03/2017   Zoster, Live 01/09/2012    Family History  Problem Relation Age of Onset   Lung cancer Father        Asbestos with mesothelioma   Heart disease Paternal Grandmother        ? etiology   Diabetes Paternal Grandmother    Diabetes Brother    Uterine cancer Paternal Aunt    Stroke Paternal Grandfather        > 55   Heart attack Brother 22   Allergies Sister    Aneurysm Mother        congenital   Stroke Brother        two strokes   Barrett's esophagus Brother      Current Outpatient Medications:    aspirin EC 81 MG tablet, Take 1 tablet (81 mg total) by mouth daily., Disp: 90 tablet, Rfl: 3   cetirizine (ZYRTEC) 10 MG tablet, Take 10 mg by mouth daily., Disp: , Rfl:    cholecalciferol (VITAMIN D3) 25 MCG (1000 UT) tablet, Take 1,000 Units by mouth daily., Disp: , Rfl:    clobetasol (TEMOVATE) 0.05 % external solution, , Disp: , Rfl:    fluticasone (FLONASE) 50 MCG/ACT nasal spray, Place 1 spray into both nostrils daily as needed for allergies or rhinitis., Disp: , Rfl:    hydroxychloroquine (PLAQUENIL) 200 MG tablet, Take 1 tablet by mouth twice a day, Disp: 60 tablet, Rfl: 9   magnesium oxide (MAG-OX) 400 MG tablet, Take 400 mg by mouth daily., Disp: , Rfl:    Multiple Vitamins-Minerals (ZINC PO), Take 1 tablet by mouth., Disp: , Rfl:    pravastatin  (PRAVACHOL) 40 MG tablet, TAKE 1 TABLET BY MOUTH DAILY GENERIC EQUIVALENT FOR PRAVACHOL, Disp: 90 tablet, Rfl: 2   Probiotic Product (PROBIOTIC ADVANCED) CAPS, Take by mouth., Disp: , Rfl:    SYNTHROID 88 MCG tablet, TAKE 1 TABLET DAILY 30 MINUTES BEFORE BREAKFAST, Disp: 90 tablet, Rfl: 3   vitamin B-12 (CYANOCOBALAMIN) 500 MCG tablet, Take 500 mcg by mouth daily., Disp: , Rfl:       Objective:   Vitals:   04/25/23 1259  BP: 108/70  Pulse: 66  SpO2:  98%  Weight: 139 lb (63 kg)  Height: 5' 5.5" (1.664 m)    Estimated body mass index is 22.78 kg/m as calculated from the following:   Height as of this encounter: 5' 5.5" (1.664 m).   Weight as of this encounter: 139 lb (63 kg).  @WEIGHTCHANGE @  [employer]   04/25/23 1259  Weight: 139 lb (63 kg)     Physical Exam   General: No distress. Looks well O2 at rest: no Cane present: no Sitting in wheel chair: no Frail: no Obese: no Neuro: Alert and Oriented x 3. GCS 15. Speech normal Psych: Pleasant Resp:  Barrel Chest - no.  Wheeze - no, Crackles - mild baseNo overt respiratory distress CVS: Normal heart sounds. Murmurs - no Ext: Stigmata of Connective Tissue Disease - no HEENT: Normal upper airway. PEERL +. No post nasal drip        Assessment:       ICD-10-CM   1. IPF (idiopathic pulmonary fibrosis) (HCC)  J84.112     2. Encounter for drug therapy  Z79.899          Plan:     Patient Instructions  IPF (idiopathic pulmonary fibrosis) (HCC)   - More stable 04/25/2023 - No exercise hypoxemia 04/25/2023   Plan = shared decisin making -consider study first and then once on study -> few mnths in add low dose ofev - return portable o2  Followup -  6 weeks x 30 min visit with APP pr Cyntia Staley   FOLLOWUP Return in about 6 weeks (around 06/06/2023) for with Dr Marchelle Gearing.  (Level 04 E&M 2024: Estb >= 30 min  visit spent in total care time and counseling or/and coordination of care by this undersigned MD - Dr  Kalman Shan. This includes one or more of the following on this same day 04/25/2023: pre-charting, chart review, note writing, documentation discussion of test results, diagnostic or treatment recommendations, prognosis, risks and benefits of management options, instructions, education, compliance or risk-factor reduction. It excludes time spent by the CMA or office staff in the care of the patient . Actual time is 30 min)    SIGNATURE    Dr. Kalman Shan, M.D., F.C.C.P,  Pulmonary and Critical Care Medicine Staff Physician, [facility] Center Director - Interstitial Lung Disease  Program  Pulmonary Fibrosis [facility] [place], [place], [address]  Pager: [phone], If no answer or between  15:00h - 7:00h: call [phone] Telephone: [phone]  3:32 PM 04/25/2023

## 2023-04-25 NOTE — Patient Instructions (Addendum)
 IPF (idiopathic pulmonary fibrosis) (HCC)   - More stable 04/25/2023 - No exercise hypoxemia 04/25/2023   Plan = shared decisin making -consider study first and then once on study -> few mnths in add low dose ofev - return portable o2  Followup -  6 weeks x 30 min visit with APP pr Marchelle Gearing

## 2023-04-29 ENCOUNTER — Encounter: Admitting: Internal Medicine

## 2023-04-29 DIAGNOSIS — Z006 Encounter for examination for normal comparison and control in clinical research program: Secondary | ICD-10-CM

## 2023-04-29 DIAGNOSIS — J849 Interstitial pulmonary disease, unspecified: Secondary | ICD-10-CM

## 2023-04-29 NOTE — Research (Signed)
 Title: A Phase 2 Multicenter, Randomized, Double-blind, Placebo Controlled Study to Evaluate the Safety, Tolerability, Pharmacokinetics, and Efficacy of TTI-101 in Participants with Idiopathic Pulmonary Fibrosis    Dose and Duration of Treatment: 75 participants will be enrolled using a 1:1:1 randomization ratio over a 20 week period (4 week screening period, 12 week treatment period and a 4 week follow-up period).   25 participants: TTI-101 400 mg/day   25 participants: TTI-101 800 mg/day   25 participants: placebo   Protocol # TVD-101-003P    Sponsor: [employer] [address] [place], [place] [address] [place]  Protocol Version/Amendment: 4.0, date  29 Aug 2022,  for 04/29/2023 date   Consent Version: ver. Revised 29 Nov 2022  for 04/29/2023 date   Investigator Brochure Version: Edition Number 8.0 01 Dec 2021   Investigator Brochure Product: TTI-101        Mechanism of action: TTI-101 is a small-molecule inhibitor of STAT3 (signal transducer and activator of transcription 3). STAT3 plays a major role in the cellular processes involved in fibrosis including fibroblast proliferation. TTI-101 binds tightly to STAT3 which prevents STAT3 recruitment to signaling complexes that contain activated tyrosine kinases, thereby preventing STAT3 phosphorylation on Y705 and STAT3 activation. Binding of TTI-101 to STAT3 also prevents STAT3 dimerization. As such, TTI-101 is expected to prevent or reverse progression of fibrosis in IPF patients.  Key Inclusion Criteria: Age >= 40 years  Diagnosed with IPF based on the 2018 or 2022 ATS/ERS/JRS/ALAT guidelines within 5 years  Chest HRCT performed within 12 months meeting requirements for IPF Extent of fibrosis > emphysematous changes on the HRCT Meeting all the following during screening confirmed by central review,   >40% of predicted FVC and a (FEV1)/FVC >=0.7  A predicted DLCO (Hb corrected) >25%  SpO2 >= 88% with up to 4L  O2/min by pulse oximetry at rest  If currently receiving nintedanib, stable dose for >3 months  Key Exclusion Criteria: Known to have the following diseases at screening: Uncontrolled pulmonary hypertension, or pulmonary hypertension requiring chronic medical treatment Congestive heart failure - NYHA Class III or IV   Drug-induced interstitial pneumonia, pneumoconiosis, hypersensitivity pneumonitis, or radiation pneumonitis   Collagen vascular disease with involvement of the lung, autoimmune disease with involvement of the lung, sarcoidosis, granulomatous disease Severe hypertension (BP >= 160/100 despite maximal therapy within 3 months of visit 1) Myocardial infarction, unstable cardiac angina, or history of thrombotic event within 6 months of screening QTcF >450 msec for men and >470 msec for women at screening.   History of significant/symptomatic bradycardia long QT syndrome or  Uncorrected hypomagnesemia or hypokalemia or Heart rate <50 bpm at rest   Unresolved respiratory tract infection within 4 weeks or an acute exacerbation of IPF within 3 months  Active cancer or a history of cancer with a significant risk of recurrence during the study.  eGFR <=52mL/min/1.73 m2, Hb<=10g/dL, XBJ<4782/NF6 , platelets <100,000/mm3 , serum total bilirubin >1.5 x ULN, liver enzymes >= 2 x ULN Receiving steroids > 10 mg/day of prednisolone or its equivalent within 2 weeks   Immunosuppressive agents within 4 weeks  Received pirfenidone within 3 months, smoking or vaping within 3 months   Pharmacokinetics: (ADME) - Contraindications: The SEDD formulation for TTI-101 provided greater systemic exposures relative to the Labrasol/PEG formulation. The SEDD formulation decreased the pill burden while increasing systemic exposures. The half-life of the drug in participants generally ranged from 4-8 h. The half-life data are supportive of the BID dose schedule.  The administration of TTI-101 is limited to  investigational use only and limited to the oral route of administration. TTI-101 is contraindicated for use in participants with known hypersensitivity to any of the excipients. Special Warnings/Considerations: Insufficient experience exists with TTI-101 to provide comprehensive warning guidance beyond what is standard for any investigational drug.  Interactions:  Studies indicated the potential for strong induction of CYP1A2 by TTI-101. As expected, TTI-101 did induce metabolism of pirfenidone, known to be primarily metabolized by CYP1A2. There was no interaction shown between TTI-101 and nintedanib. Drug Interaction Studies: [facility] conducted a DDI study in normal healthy volunteers given TTI-101 and either pirfenidone or nintedanib. Serious Adverse Reactions Observed in Clinical studies: 32 participants out of 105 reported SAE. (30.5%). However all SAEs reported were from study 2016-0842 (participants with solid tumors). No SAEs were reported in the DDI study (Healthy volunteer). Majority of SAE were GI disorders.  Serious Adverse Reactions Observed Postmarketing: To date, TTI-101 has not been registered for use or marketed in any jurisdiction. Safety Data:  The safety data from study 2016-0842 demonstrates the most frequent AEs are GI-related ( nausea, vomiting, change in appetite, diarrhea, abdominal pain) and related to liver function (elevated AST and ALT).  Fewer AEs were reported with the SDD formulation. EKG - PR prolongation was seen in 1 out of 105 participants. (1%)    Overall Adverse events in total participants N=105 (%) Severity  Grade1-mild Grade2-Moderate Grade3-Severe Formulation 1  N=15 Formulation 2    N=47 Formulation 3 (SDD)  N=48*  Diarrhea 30 (28.6%) 24 (22.9%) 7 (6.7%) 8 (7.6%) 6 (40.0%) 21 (44.7%) 3 (6.3%)  Nausea 14 (13.3%) 9 (8.6%) 4 (3.8%) 1 (1.0%) 4 (26.7%) 7 (14.9%) 3 (6.3%)  Vomiting 7 (6.7%) 5 (4.8%) 2 (1.9%) - 3 (20.0%) 2 (4.3%) 2 (4.2%)    Abdominal pain 3 (2.9%) 2 (1.9%) 1 (1.0%) - 3 (20.0%) - -  Elevated ALT 7 (6.7%)   4 (3.8%)   3 (2.9%)   3 (2.9%) 2 (13.3%)  4 (8.5%)   1 (2.1%)   Elevated AST 6 (5.7%)   2 (1.9%)   4 (3.8%)   3 (2.9%) 1 (6.7%)  4 (8.5%)  1 (2.1%)   Headache 5 (4.8%) 5 (4.8%) - - - - 5 (10.4%)    TEAE from DDI study alone with Healthy Volunteer N=41 Part 1 nintedanib Part 2 Pirfenidone  Diarrhea  2 (4.9%)     1 (4.8%)  1 (5.0%)  Nausea 2 (4.9%)   1 (4.8%)   1 (5.0%)   Vomiting 1 (2.4%)     -  1 (5.0%)   Abdominal distension 1 (2.4%)     1 (4.8%)  -  Headache 5 (12.2%)   4 (19.0%)  1 (5.0%)    Stability:  The recommended storage condition for TTI-101 tablet, 200 mg and matching placebo is room temperature (20C to 25C or 21F to 67F). Excursions between 15C and 30C (58F and 77F) are allowed.    [facility] [place] Clinical Research Coordinator note:   This visit for Subject Elizabeth Espinoza with DOB: October 02, 1940 on 04/29/2023 for the above protocol is Visit/Encounter # screening  and is for purpose of research.   The consent for this encounter is under Protocol Version 4.0, Investigator Brochure Version 8.0, Consent Version revised and  currently IRB approved.   Subject/ expressed continued interest and consent in continuing as a study subject. Subject confirmed that there was    no change in contact information (e.g.  address, telephone, email). Subject thanked for participation in research and contribution to science. In this visit 04/29/2023 the subject will be evaluated by Sub-Investigator) named Marga Melnick. This research coordinator has verified that the above investigator is  up to date with his/her training logs.   The Subject was informed that the PI  continues to have oversight of the subject's visits and course through relevant discussions, reviews, and also specifically of this visit by routing of this note to the PI.      1. This visit is a key visit of screening. The PI is  not available for this visit.    2.  In addition, ahead of the key visit of  screening the visit and subject were discussed with the PI  on date of 25 Apr 2023  as part of direct PI oversight.   All questions answered and consent documents signed.  Screening visit assessments completed with the exception of spirometry /DLCO due to staffing issue. Subject to return for spirometry later this week.  Please see subject binder for further details.   Signed by  Christell Constant MD  Clinical Research Coordinator  [facility]  [place], [place] 3:34 PM 04/29/2023

## 2023-04-30 NOTE — Progress Notes (Signed)
 Elizabeth Espinoza, DOB  06-Sep-1940, was seen as subject in a clinical trial /Protocol # TVD-101--003P Cardiopulmonary symptoms are stable following resolution of upper & lower respiratory infectious symptoms in late February -early April. Other or new symptoms denied. Pertinent physical findings include:fine rales @ RLL;bronchovesicular breath sounds superiorly;interosseous wasting of hands; & isolated ecchymoses, especially R wrist area. All physical findings are NCS.                                                                     Pecola Lawless MD,SI

## 2023-05-01 ENCOUNTER — Encounter: Payer: Self-pay | Admitting: Emergency Medicine

## 2023-05-01 ENCOUNTER — Ambulatory Visit (INDEPENDENT_AMBULATORY_CARE_PROVIDER_SITE_OTHER): Admitting: Emergency Medicine

## 2023-05-01 VITALS — BP 116/62 | HR 70 | Temp 97.7°F | Ht 65.5 in | Wt 139.0 lb

## 2023-05-01 DIAGNOSIS — B349 Viral infection, unspecified: Secondary | ICD-10-CM | POA: Diagnosis not present

## 2023-05-01 DIAGNOSIS — J849 Interstitial pulmonary disease, unspecified: Secondary | ICD-10-CM

## 2023-05-01 NOTE — Patient Instructions (Signed)
 Viral Illness, Adult Viruses are tiny germs that can get into a person's body and cause illness. There are many different types of viruses. And they cause many types of illness. Viral illnesses can range from mild to severe. They can affect various parts of the body. Short-term conditions that are caused by a virus include colds and flu (influenza) and stomach viruses. Long-term conditions that are caused by a virus include herpes, shingles, and human immunodeficiency virus (HIV) infection. A few viruses have been linked to certain cancers. What are the causes? Many types of viruses can cause illness. Viruses get into cells in your body, multiply, and cause the infected cells to work differently or die. When these cells die, they release more of the virus. When this happens, you get symptoms of the illness and the virus spreads to other cells. If the virus takes over how the cell works, it can cause the cell to divide and grow out of control. This happens when a virus causes cancer. Different viruses get into the body in different ways. You can get a virus by: Swallowing food or water that has come in contact with the virus. Breathing in droplets that have been coughed or sneezed into the air by an infected person. Touching a surface that has the virus on it and then touching your eyes, nose, or mouth. Being bitten by an insect or animal that carries the virus. Having sexual contact with a person who is infected with the virus. Being exposed to blood or fluids that contain the virus, either through an open cut or during a transfusion. If a virus enters your body, your body's disease-fighting system (immune system) will try to fight the virus. You may be at higher risk for a viral illness if your immune system is weak. What are the signs or symptoms? Symptoms depend on the type of virus and the location of the cells that it gets into. Symptoms can include: For cold and flu  viruses: Fever. Headache. Sore throat. Muscle aches. Stuffy nose (nasal congestion). Cough. For stomach (gastrointestinal) viruses: Fever. Pain in the abdomen. Nausea or vomiting. Diarrhea. For liver viruses (hepatitis): Loss of appetite. Feeling tired. Skin or the white parts of your eyes turning yellow (jaundice). For brain and spinal cord viruses: Fever. Headache. Stiff neck. Nausea and vomiting. Confusion or being sleepy. For skin viruses: Warts. Itching. Rash. For sexually transmitted viruses: Discharge. Swelling. Redness. Rash. How is this diagnosed? This condition may be diagnosed based on one or more of these: Your symptoms and medical history. A physical exam. Tests, such as: Blood tests. Tests on a sample of mucus from your lungs (sputum sample). Tests on a poop (stool) sample. Tests on a swab of body fluids or a skin sore (lesion). How is this treated? Viruses can be hard to treat because they live within cells. Antibiotics do not treat viruses because these medicines do not get inside cells. Treatment for a viral illness may include: Resting and drinking a lot of fluids. Medicines to treat symptoms. These can include over-the-counter medicine for pain and fever, medicines for cough or congestion, and medicines for diarrhea. Antiviral medicines. These medicines are available only for certain types of viruses. Some viral illnesses can be prevented with vaccinations. A common example is the flu shot. Follow these instructions at home: Medicines Take over-the-counter and prescription medicines only as told by your health care provider. If you were prescribed an antiviral medicine, take it as told by your provider. Do not stop  taking the antiviral even if you start to feel better. Know when antibiotics are needed and when they are not needed. Antibiotics do not treat viruses. You may get an antibiotic if your provider thinks that you may have, or are at risk  for, a bacterial infection and you have a viral infection. Do not ask for an antibiotic prescription if you have been diagnosed with a viral illness. Antibiotics will not make your illness go away faster. Taking antibiotics when they are not needed can lead to antibiotic resistance. When this develops, the medicine no longer works against the bacteria that it normally fights. General instructions Drink enough fluids to keep your pee (urine) pale yellow. Rest as much as possible. Return to your normal activities as told by your provider. Ask your provider what activities are safe for you. How is this prevented? To lower your risk of getting another viral illness: Wash your hands often with soap and water for at least 20 seconds. If soap and water are not available, use hand sanitizer. Avoid touching your nose, eyes, and mouth, especially if you have not washed your hands recently. If anyone in your household has a viral infection, clean all household surfaces that may have been in contact with the virus. Use soap and hot water. You may also use a commercially prepared, bleach-containing solution. Stay away from people who are sick with symptoms of a viral infection. Do not share items such as toothbrushes and water bottles with other people. Keep your vaccinations up to date. This includes getting a yearly flu shot. Eat a healthy diet and get plenty of rest. Contact a health care provider if: You have symptoms of a viral illness that do not go away. Your symptoms come back after going away. Your symptoms get worse. Get help right away if: You have trouble breathing. You have a severe headache or a stiff neck. You have severe vomiting or pain in your abdomen. These symptoms may be an emergency. Get help right away. Call 911. Do not wait to see if the symptoms will go away. Do not drive yourself to the hospital. This information is not intended to replace advice given to you by your health  care provider. Make sure you discuss any questions you have with your health care provider. Document Revised: 01/24/2022 Document Reviewed: 11/08/2021 Elsevier Patient Education  2024 [employer].

## 2023-05-01 NOTE — Assessment & Plan Note (Signed)
 Early viral picture Clinically stable.  No red flag signs or symptoms Running its course without complications Symptom management discussed Advised to contact the office if worse or no better during the next several days Advised to rest and stay well-hydrated

## 2023-05-01 NOTE — Assessment & Plan Note (Signed)
 Chronic stable condition Follows up with pulmonary on a regular basis

## 2023-05-01 NOTE — Progress Notes (Signed)
 Elizabeth Espinoza 83 y.o.   Chief Complaint  Patient presents with   Dysphagia    Patient states she woke up with pain in her ears and throat only when she swallows. Also having headaches with some nausea. She gargled salt water to help with the throat and it did a little     HISTORY OF PRESENT ILLNESS: Acute problem visit today.  Patient of Dr. Cheryll Cockayne. This is a 83 y.o. female complaining of throat that started early this morning followed by bilateral ear pain with headache and nausea No other associated symptoms No other complaints or medical concerns today.  HPI   Prior to Admission medications   Medication Sig Start Date End Date Taking? Authorizing Provider  aspirin EC 81 MG tablet Take 1 tablet (81 mg total) by mouth daily. 07/22/18  Yes Lewayne Bunting, MD  cetirizine (ZYRTEC) 10 MG tablet Take 10 mg by mouth daily.   Yes [provider]  cholecalciferol (VITAMIN D3) 25 MCG (1000 UT) tablet Take 1,000 Units by mouth daily.   Yes [provider]  clobetasol (TEMOVATE) 0.05 % external solution  05/25/20  Yes [provider]  fluticasone (FLONASE) 50 MCG/ACT nasal spray Place 1 spray into both nostrils daily as needed for allergies or rhinitis.   Yes [provider]  hydroxychloroquine (PLAQUENIL) 200 MG tablet Take 1 tablet by mouth twice a day 02/07/22  Yes   magnesium oxide (MAG-OX) 400 MG tablet Take 400 mg by mouth daily.   Yes [provider]  Multiple Vitamins-Minerals (ZINC PO) Take 1 tablet by mouth.   Yes [provider]  pravastatin (PRAVACHOL) 40 MG tablet TAKE 1 TABLET BY MOUTH DAILY GENERIC EQUIVALENT FOR PRAVACHOL 12/13/22  Yes Burns, Bobette Mo, MD  Probiotic Product (PROBIOTIC ADVANCED) CAPS Take by mouth.   Yes [provider]  SYNTHROID 88 MCG tablet TAKE 1 TABLET DAILY 30 MINUTES BEFORE BREAKFAST 08/13/22  Yes Carlus Pavlov, MD  vitamin B-12 (CYANOCOBALAMIN) 500 MCG tablet Take 500 mcg by mouth  daily.   Yes [provider]    Allergies  Allergen Reactions   Caffeine     palpitations   Chlorpheniramine-Pseudoeph     palpitations   Maxifed     palpitations   Other Other (See Comments)   Pirfenidone Nausea Only    Esbriet Causes nausea and weakness    Patient Active Problem List   Diagnosis Date Noted   Leg cramping 07/16/2022   Senile purpura (HCC) 07/16/2022   H/O iron deficiency 05/01/2022   Increased thirst 05/01/2022   PAD (peripheral artery disease) (HCC) 05/01/2022   Allergy 07/12/2021   Hearing loss 07/12/2021   Aortic atherosclerosis (HCC) 07/06/2020   Research study patient 04/11/2020   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   Bilateral shoulder pain 11/18/2018   Bony sclerosis 10/21/2018   Chronic cough 01/29/2018   Rash and nonspecific skin eruption 07/30/2016   Prediabetes 02/24/2015   Dyspnea on exertion 09/29/2014   ILD (interstitial lung disease) (HCC) 12/11/2012   Barrett's esophagus 12/11/2012   RLS (restless legs syndrome) 04/13/2011   ARTHRALGIA 09/01/2008   HIATAL HERNIA 07/31/2007   History of cardiovascular disorder 07/31/2007   Allergic rhinitis 06/19/2007   GASTROESOPHAGEAL REFLUX DISEASE, CHRONIC 06/19/2007   Hypothyroidism- Dr Elvera Lennox 02/07/2007   HYPERLIPIDEMIA 02/07/2007    Past Medical History:  Diagnosis Date   Allergic rhinitis    GERD (gastroesophageal reflux disease)    Glaucoma    SUSPECT  Hiatal hernia    Hyperlipidemia    LDL goal = < 100   Hypothyroidism    ILD (interstitial lung disease) (HCC)    Lichen planopilaris    MVP (mitral valve prolapse)    documented on 2 D ECHO   Restless legs    Urticaria     Past Surgical History:  Procedure Laterality Date   APPENDECTOMY     with TAH   CARDIAC CATHETERIZATION  1989   negative   COLONOSCOPY  2005   Dr Juanda Chance   COLONOSCOPY  01/2013   Dr Loreta Ave   esophageal dilation  07/04/2011   Dr Neita Garnet SIGMOIDOSCOPY  1999    Dr Juanda Chance    KNEE ARTHROSCOPY  2001, 2005   Dr Eulah Pont   bilat   TOTAL ABDOMINAL HYSTERECTOMY     for Endometriosis; no BSO, Dr Arletha Grippe   UPPER GI ENDOSCOPY  01/2013   Dr Loreta Ave   VIDEO BRONCHOSCOPY Bilateral 02/05/2019   Procedure: VIDEO BRONCHOSCOPY WITH FLUORO;  Surgeon: Kalman Shan, MD;  Location: [facility] ENDOSCOPY;  Service: Endoscopy;  Laterality: Bilateral;    Social History   Socioeconomic History   Marital status: Married    Spouse name: Fayrene Fearing   Number of children: 2   Years of education: Not on file   Highest education level: 12th grade  Occupational History   Occupation: retired from [employer] retired  Tobacco Use   Smoking status: Never   Smokeless tobacco: Never  Vaping Use   Vaping status: Never Used  Substance and Sexual Activity   Alcohol use: Yes    Comment:  rarely   Drug use: No   Sexual activity: Yes  Other Topics Concern   Not on file  Social History Narrative   Live at home with husband   Social Drivers of Corporate investment banker Strain: Low Risk  (03/21/2023)   Overall Financial Resource Strain (CARDIA)    Difficulty of Paying Living Expenses: Not hard at all  Food Insecurity: No Food Insecurity (03/21/2023)   Hunger Vital Sign    Worried About Running Out of Food in the Last Year: Never true    Ran Out of Food in the Last Year: Never true  Transportation Needs: No Transportation Needs (03/21/2023)   PRAPARE - Administrator, Civil Service (Medical): No    Lack of Transportation (Non-Medical): No  Physical Activity: Unknown (03/28/2023)   Exercise Vital Sign    Days of Exercise per Week: 7 days    Minutes of Exercise per Session: Not on file  Stress: No Stress Concern Present (03/21/2023)   [employer] of Occupational Health - Occupational Stress Questionnaire    Feeling of Stress : Not at all  Social Connections: Moderately Isolated (03/21/2023)   Social Connection and Isolation Panel [NHANES]    Frequency of Communication with  Friends and Family: More than three times a week    Frequency of Social Gatherings with Friends and Family: Once a week    Attends Religious Services: Never    Database administrator or Organizations: No    Attends Engineer, structural: Not on file    Marital Status: Married  Catering manager Violence: Not At Risk (03/28/2023)   Humiliation, Afraid, Rape, and Kick questionnaire    Fear of Current or Ex-Partner: No    Emotionally Abused: No    Physically Abused: No    Sexually Abused: No    Family History  Problem  Relation Age of Onset   Lung cancer Father        Asbestos with mesothelioma   Heart disease Paternal Grandmother        ? etiology   Diabetes Paternal Grandmother    Diabetes Brother    Uterine cancer Paternal Aunt    Stroke Paternal Grandfather        > 55   Heart attack Brother 22   Allergies Sister    Aneurysm Mother        congenital   Stroke Brother        two strokes   Barrett's esophagus Brother      Review of Systems  Constitutional: Negative.  Negative for chills and fever.  HENT:  Positive for ear pain and sore throat.   Respiratory: Negative.  Negative for cough and shortness of breath.   Cardiovascular: Negative.  Negative for chest pain and palpitations.  Gastrointestinal:  Positive for nausea. Negative for abdominal pain, diarrhea and vomiting.  Genitourinary: Negative.  Negative for dysuria and hematuria.  Skin: Negative.  Negative for rash.  Neurological:  Positive for headaches.  All other systems reviewed and are negative.   Today's Vitals   05/01/23 0949  BP: 116/62  Pulse: 70  Temp: 97.7 F (36.5 C)  TempSrc: Oral  SpO2: 99%  Weight: 139 lb (63 kg)  Height: 5' 5.5" (1.664 m)   Body mass index is 22.78 kg/m.   Physical Exam Vitals reviewed.  Constitutional:      Appearance: Normal appearance.  HENT:     Head: Normocephalic.     Right Ear: Tympanic membrane, ear canal and external ear normal.     Left Ear: Ear  canal and external ear normal.     Mouth/Throat:     Mouth: Mucous membranes are moist.     Pharynx: Oropharynx is clear.  Eyes:     Extraocular Movements: Extraocular movements intact.     Conjunctiva/sclera: Conjunctivae normal.     Pupils: Pupils are equal, round, and reactive to light.  Cardiovascular:     Rate and Rhythm: Normal rate and regular rhythm.     Pulses: Normal pulses.     Heart sounds: Normal heart sounds.  Pulmonary:     Effort: Pulmonary effort is normal.     Breath sounds: Rales (Dry bilateral crackles) present.  Musculoskeletal:     Cervical back: No tenderness.  Lymphadenopathy:     Cervical: No cervical adenopathy.  Skin:    General: Skin is warm and dry.  Neurological:     General: No focal deficit present.     Mental Status: She is alert and oriented to person, place, and time.  Psychiatric:        Mood and Affect: Mood normal.        Behavior: Behavior normal.      ASSESSMENT & PLAN: A total of 32 minutes was spent with the patient and counseling/coordination of care regarding preparing for this visit, review of most recent office visit notes, review of multiple chronic medical conditions under management, review of all medications, diagnosis of viral illness and symptom management, prognosis, documentation, and need for follow-up if no better or worse during the next several days.  Problem List Items Addressed This Visit       Respiratory   ILD (interstitial lung disease) (HCC)   Chronic stable condition Follows up with pulmonary on a regular basis        Other   Viral illness -  Primary   Early viral picture Clinically stable.  No red flag signs or symptoms Running its course without complications Symptom management discussed Advised to contact the office if worse or no better during the next several days Advised to rest and stay well-hydrated      Patient Instructions  Viral Illness, Adult Viruses are tiny germs that can get into a  person's body and cause illness. There are many different types of viruses. And they cause many types of illness. Viral illnesses can range from mild to severe. They can affect various parts of the body. Short-term conditions that are caused by a virus include colds and flu (influenza) and stomach viruses. Long-term conditions that are caused by a virus include herpes, shingles, and human immunodeficiency virus (HIV) infection. A few viruses have been linked to certain cancers. What are the causes? Many types of viruses can cause illness. Viruses get into cells in your body, multiply, and cause the infected cells to work differently or die. When these cells die, they release more of the virus. When this happens, you get symptoms of the illness and the virus spreads to other cells. If the virus takes over how the cell works, it can cause the cell to divide and grow out of control. This happens when a virus causes cancer. Different viruses get into the body in different ways. You can get a virus by: Swallowing food or water that has come in contact with the virus. Breathing in droplets that have been coughed or sneezed into the air by an infected person. Touching a surface that has the virus on it and then touching your eyes, nose, or mouth. Being bitten by an insect or animal that carries the virus. Having sexual contact with a person who is infected with the virus. Being exposed to blood or fluids that contain the virus, either through an open cut or during a transfusion. If a virus enters your body, your body's disease-fighting system (immune system) will try to fight the virus. You may be at higher risk for a viral illness if your immune system is weak. What are the signs or symptoms? Symptoms depend on the type of virus and the location of the cells that it gets into. Symptoms can include: For cold and flu viruses: Fever. Headache. Sore throat. Muscle aches. Stuffy nose (nasal  congestion). Cough. For stomach (gastrointestinal) viruses: Fever. Pain in the abdomen. Nausea or vomiting. Diarrhea. For liver viruses (hepatitis): Loss of appetite. Feeling tired. Skin or the white parts of your eyes turning yellow (jaundice). For brain and spinal cord viruses: Fever. Headache. Stiff neck. Nausea and vomiting. Confusion or being sleepy. For skin viruses: Warts. Itching. Rash. For sexually transmitted viruses: Discharge. Swelling. Redness. Rash. How is this diagnosed? This condition may be diagnosed based on one or more of these: Your symptoms and medical history. A physical exam. Tests, such as: Blood tests. Tests on a sample of mucus from your lungs (sputum sample). Tests on a poop (stool) sample. Tests on a swab of body fluids or a skin sore (lesion). How is this treated? Viruses can be hard to treat because they live within cells. Antibiotics do not treat viruses because these medicines do not get inside cells. Treatment for a viral illness may include: Resting and drinking a lot of fluids. Medicines to treat symptoms. These can include over-the-counter medicine for pain and fever, medicines for cough or congestion, and medicines for diarrhea. Antiviral medicines. These medicines are available only for certain types  of viruses. Some viral illnesses can be prevented with vaccinations. A common example is the flu shot. Follow these instructions at home: Medicines Take over-the-counter and prescription medicines only as told by your health care provider. If you were prescribed an antiviral medicine, take it as told by your provider. Do not stop taking the antiviral even if you start to feel better. Know when antibiotics are needed and when they are not needed. Antibiotics do not treat viruses. You may get an antibiotic if your provider thinks that you may have, or are at risk for, a bacterial infection and you have a viral infection. Do not ask for an  antibiotic prescription if you have been diagnosed with a viral illness. Antibiotics will not make your illness go away faster. Taking antibiotics when they are not needed can lead to antibiotic resistance. When this develops, the medicine no longer works against the bacteria that it normally fights. General instructions Drink enough fluids to keep your pee (urine) pale yellow. Rest as much as possible. Return to your normal activities as told by your provider. Ask your provider what activities are safe for you. How is this prevented? To lower your risk of getting another viral illness: Wash your hands often with soap and water for at least 20 seconds. If soap and water are not available, use hand sanitizer. Avoid touching your nose, eyes, and mouth, especially if you have not washed your hands recently. If anyone in your household has a viral infection, clean all household surfaces that may have been in contact with the virus. Use soap and hot water. You may also use a commercially prepared, bleach-containing solution. Stay away from people who are sick with symptoms of a viral infection. Do not share items such as toothbrushes and water bottles with other people. Keep your vaccinations up to date. This includes getting a yearly flu shot. Eat a healthy diet and get plenty of rest. Contact a health care provider if: You have symptoms of a viral illness that do not go away. Your symptoms come back after going away. Your symptoms get worse. Get help right away if: You have trouble breathing. You have a severe headache or a stiff neck. You have severe vomiting or pain in your abdomen. These symptoms may be an emergency. Get help right away. Call 911. Do not wait to see if the symptoms will go away. Do not drive yourself to the hospital. This information is not intended to replace advice given to you by your health care provider. Make sure you discuss any questions you have with your health  care provider. Document Revised: 01/24/2022 Document Reviewed: 11/08/2021 Elsevier Patient Education  2024 Elsevier Inc.    Edwina Barth, MD  [facility] at [facility]

## 2023-05-02 DIAGNOSIS — J84112 Idiopathic pulmonary fibrosis: Secondary | ICD-10-CM | POA: Diagnosis not present

## 2023-05-15 ENCOUNTER — Other Ambulatory Visit: Payer: Self-pay | Admitting: Internal Medicine

## 2023-05-15 ENCOUNTER — Ambulatory Visit (HOSPITAL_BASED_OUTPATIENT_CLINIC_OR_DEPARTMENT_OTHER)
Admission: RE | Admit: 2023-05-15 | Discharge: 2023-05-15 | Disposition: A | Discharge status: Home / Self Care | Payer: Self-pay | Source: Ambulatory Visit | Attending: Internal Medicine | Admitting: Internal Medicine

## 2023-05-15 DIAGNOSIS — Z006 Encounter for examination for normal comparison and control in clinical research program: Secondary | ICD-10-CM

## 2023-05-15 DIAGNOSIS — J84112 Idiopathic pulmonary fibrosis: Secondary | ICD-10-CM

## 2023-05-16 ENCOUNTER — Encounter

## 2023-05-16 DIAGNOSIS — K22719 Barrett's esophagus with dysplasia, unspecified: Secondary | ICD-10-CM

## 2023-05-16 DIAGNOSIS — Z006 Encounter for examination for normal comparison and control in clinical research program: Secondary | ICD-10-CM

## 2023-05-16 DIAGNOSIS — J84112 Idiopathic pulmonary fibrosis: Secondary | ICD-10-CM

## 2023-05-16 NOTE — Research (Signed)
 Title: A Phase 2 Multicenter, Randomized, Double-blind, Placebo Controlled Study to Evaluate the Safety, Tolerability, Pharmacokinetics, and Efficacy of TTI-101 in Participants with Idiopathic Pulmonary Fibrosis    Dose and Duration of Treatment: 75 participants will be enrolled using a 1:1:1 randomization ratio over a 20 week period (4 week screening period, 12 week treatment period and a 4 week follow-up period).   25 participants: TTI-101 400 mg/day   25 participants: TTI-101 800 mg/day   25 participants: placebo   Protocol # TVD-[id number]    Sponsor: [employer], [address] [employer] [address], [address] [place], [place] [address] [place]   Protocol Version/Amendment: 4.0, date  29 Aug 2022,  for 05/16/2023 date of  7Aug2024  Consent Version: ver. Revised 29 Nov 2022  for 05/16/2023 date   Investigator Brochure Version: Edition Number 8.0  25Oct2024  Investigator Brochure Product: TTI-101        Mechanism of action: TTI-101 is a small-molecule inhibitor of STAT3 (signal transducer and activator of transcription 3). STAT3 plays a major role in the cellular processes involved in fibrosis including fibroblast proliferation. TTI-101 binds tightly to STAT3 which prevents STAT3 recruitment to signaling complexes that contain activated tyrosine kinases, thereby preventing STAT3 phosphorylation on Y705 and STAT3 activation. Binding of TTI-101 to STAT3 also prevents STAT3 dimerization. As such, TTI-101 is expected to prevent or reverse progression of fibrosis in IPF patients.  Key Inclusion Criteria: Age >= 40 years  Diagnosed with IPF based on the 2018 or 2022 ATS/ERS/JRS/ALAT guidelines within 5 years  Chest HRCT performed within 12 months meeting requirements for IPF Extent of fibrosis > emphysematous changes on the HRCT Meeting all the following during screening confirmed by central review,   >40% of predicted FVC and a (FEV1)/FVC >=0.7  A predicted DLCO (Hb corrected) >25%  SpO2 >= 88%  with up to 4L O2/min by pulse oximetry at rest  If currently receiving nintedanib, stable dose for >3 months  Key Exclusion Criteria: Known to have the following diseases at screening: Uncontrolled pulmonary hypertension, or pulmonary hypertension requiring chronic medical treatment Congestive heart failure - NYHA Class III or IV   Drug-induced interstitial pneumonia, pneumoconiosis, hypersensitivity pneumonitis, or radiation pneumonitis   Collagen vascular disease with involvement of the lung, autoimmune disease with involvement of the lung, sarcoidosis, granulomatous disease Severe hypertension (BP >= 160/100 despite maximal therapy within 3 months of visit 1) Myocardial infarction, unstable cardiac angina, or history of thrombotic event within 6 months of screening QTcF >450 msec for men and >470 msec for women at screening.   History of significant/symptomatic bradycardia long QT syndrome or  Uncorrected hypomagnesemia or hypokalemia or Heart rate <50 bpm at rest   Unresolved respiratory tract infection within 4 weeks or an acute exacerbation of IPF within 3 months  Active cancer or a history of cancer with a significant risk of recurrence during the study.  eGFR <=32mL/min/1.73 m2, Hb<=10g/dL, NFA<2130/QM5 , platelets <100,000/mm3 , serum total bilirubin >1.5 x ULN, liver enzymes >= 2 x ULN Receiving steroids > 10 mg/day of prednisolone or its equivalent within 2 weeks   Immunosuppressive agents within 4 weeks  Received pirfenidone  within 3 months, smoking or vaping within 3 months   Pharmacokinetics: (ADME) - Contraindications: The SEDD formulation for TTI-101 provided greater systemic exposures relative to the Labrasol/PEG formulation. The SEDD formulation decreased the pill burden while increasing systemic exposures. The half-life of the drug in participants generally ranged from 4-8 h. The half-life data are supportive of the BID dose schedule.  The administration of TTI-101 is  limited to investigational use only and limited to the oral route of administration. TTI-101 is contraindicated for use in participants with known hypersensitivity to any of the excipients. Special Warnings/Considerations: Insufficient experience exists with TTI-101 to provide comprehensive warning guidance beyond what is standard for any investigational drug.  Interactions:  Studies indicated the potential for strong induction of CYP1A2 by TTI-101. As expected, TTI-101 did induce metabolism of pirfenidone , known to be primarily metabolized by CYP1A2. There was no interaction shown between TTI-101 and nintedanib. Drug Interaction Studies: [facility] conducted a DDI study in normal healthy volunteers given TTI-101 and either pirfenidone  or nintedanib. Serious Adverse Reactions Observed in Clinical studies: 32 participants out of 105 reported SAE. (30.5%). However all SAEs reported were from study 2016-0842 (participants with solid tumors). No SAEs were reported in the DDI study (Healthy volunteer). Majority of SAE were GI disorders.  Serious Adverse Reactions Observed Postmarketing: To date, TTI-101 has not been registered for use or marketed in any jurisdiction. Safety Data:  The safety data from study 2016-0842 demonstrates the most frequent AEs are GI-related ( nausea, vomiting, change in appetite, diarrhea, abdominal pain) and related to liver function (elevated AST and ALT).  Fewer AEs were reported with the SDD formulation. EKG - PR prolongation was seen in 1 out of 105 participants. (1%)    Overall Adverse events in total participants N=105 (%) Severity  Grade1-mild Grade2-Moderate Grade3-Severe Formulation 1  N=15 Formulation 2    N=47 Formulation 3 (SDD)  N=48*  Diarrhea 30 (28.6%) 24 (22.9%) 7 (6.7%) 8 (7.6%) 6 (40.0%) 21 (44.7%) 3 (6.3%)  Nausea 14 (13.3%) 9 (8.6%) 4 (3.8%) 1 (1.0%) 4 (26.7%) 7 (14.9%) 3 (6.3%)  Vomiting 7 (6.7%) 5 (4.8%) 2 (1.9%) - 3 (20.0%) 2 (4.3%) 2  (4.2%)   Abdominal pain 3 (2.9%) 2 (1.9%) 1 (1.0%) - 3 (20.0%) - -  Elevated ALT 7 (6.7%)   4 (3.8%)   3 (2.9%)   3 (2.9%) 2 (13.3%)  4 (8.5%)   1 (2.1%)   Elevated AST 6 (5.7%)   2 (1.9%)   4 (3.8%)   3 (2.9%) 1 (6.7%)  4 (8.5%)  1 (2.1%)   Headache 5 (4.8%) 5 (4.8%) - - - - 5 (10.4%)    TEAE from DDI study alone with Healthy Volunteer N=41 Part 1 nintedanib Part 2 Pirfenidone   Diarrhea  2 (4.9%)     1 (4.8%)  1 (5.0%)  Nausea 2 (4.9%)   1 (4.8%)   1 (5.0%)   Vomiting 1 (2.4%)     -  1 (5.0%)   Abdominal distension 1 (2.4%)     1 (4.8%)  -  Headache 5 (12.2%)   4 (19.0%)  1 (5.0%)    Stability:  The recommended storage condition for TTI-101 tablet, 200 mg and matching placebo is room temperature (20C to 25C or 78F to 67F). Excursions between 15C and 30C (33F and 78F) are allowed.   [facility] [place] Clinical Research Coordinator note:   This visit for Subject ALTHEA BACKS with DOB: [birth year] on 05/16/2023 for the above protocol is Visit/Encounter # 1   and is for purpose of research.   The consent for this encounter is under Protocol Version 4.0, Investigator Brochure Version 8.0, Consent Version 7 [id number] and  is currently IRB approved.   Subject expressed continued interest and consent in continuing as a study subject. Subject confirmed that there was    no change in contact  information (e.g. address, telephone, email). Subject thanked for participation in research and contribution to science. In this visit 05/16/2023 the subject will be evaluated by Sub-Investigator named Loetta Ringer MD. This research coordinator has verified that the above investigator is up to date with his/her training logs.   The Subject was informed that the PI  continues to have oversight of the subject's visits and course through relevant discussions, reviews, and also specifically of this visit by routing of this note to the PI.   1. This visit is a key visit of  randomization. The  PI is not available for this visit.    Because the PI is NOT available, the Sub-Investigator reported and CRC has confirmed that the PI has discussed the visit a-priori with the Sub-Investigator.  2.  In addition, ahead of the key visit of randomization  the visit and subject were discussed with the PI  on date 22 Apr2025 of  over as part of direct PI oversight.   Subject completed all assessments for visit 1, IP was dispensed and first dose taken.  Subject returned for 2-4 hour post dose labs and EKGs.  All questions were answered and next visits were scheduled.   Signed by  Mauro Sox MD  Clinical Research Coordinator  [facility]  [place], [place] 2:39 PM 05/16/2023

## 2023-05-17 NOTE — Progress Notes (Signed)
 Elizabeth Espinoza, DOB 1940/12/06, was seen as subject in a clinical trial /Protocol # TVD-101-003P. Cardiopulmonary symptoms are stable.  In fact she walks two miles per day.  She does not note significant oxygen desaturation with exercise. 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  Endoscopies performed  7/20/21009, 02/20/2013, and most recently 08/12/2019 revealed only evidence of GE reflux with no evidence of Barrett's.  Her reflux symptoms are controlled by elevation of the head of the bed.  She does not have significant dyspepsia, dysphagia, or abdominal pain.  Weight is controlled with diet. Other or new symptoms denied. Pertinent physical findings include: Arcus senilis present.  Oropharyngeal exam reveals no dental disease or pharyngeal erythema.  Fine rales are noted in the lower half of the posterior thorax, R> L.  Minimal clubbing is suggested. All physical findings NCS. There is no evidence of exclusionary process upon extensive review of her past medical history.                                                                                                                        Alinda Apley MD,SI

## 2023-05-21 ENCOUNTER — Other Ambulatory Visit (HOSPITAL_BASED_OUTPATIENT_CLINIC_OR_DEPARTMENT_OTHER): Payer: Self-pay | Admitting: Internal Medicine

## 2023-05-21 ENCOUNTER — Ambulatory Visit (HOSPITAL_BASED_OUTPATIENT_CLINIC_OR_DEPARTMENT_OTHER)
Admission: RE | Admit: 2023-05-21 | Discharge: 2023-05-21 | Disposition: A | Discharge status: Home / Self Care | Source: Ambulatory Visit | Attending: Internal Medicine | Admitting: Internal Medicine

## 2023-05-21 DIAGNOSIS — R590 Localized enlarged lymph nodes: Secondary | ICD-10-CM | POA: Diagnosis not present

## 2023-05-21 DIAGNOSIS — J849 Interstitial pulmonary disease, unspecified: Secondary | ICD-10-CM | POA: Insufficient documentation

## 2023-05-21 DIAGNOSIS — R918 Other nonspecific abnormal finding of lung field: Secondary | ICD-10-CM | POA: Diagnosis not present

## 2023-05-23 ENCOUNTER — Encounter: Admitting: Internal Medicine

## 2023-05-23 ENCOUNTER — Encounter

## 2023-05-23 DIAGNOSIS — M4310 Spondylolisthesis, site unspecified: Secondary | ICD-10-CM | POA: Diagnosis not present

## 2023-05-23 DIAGNOSIS — J84112 Idiopathic pulmonary fibrosis: Secondary | ICD-10-CM

## 2023-05-23 DIAGNOSIS — Z006 Encounter for examination for normal comparison and control in clinical research program: Secondary | ICD-10-CM

## 2023-05-23 NOTE — Patient Instructions (Addendum)
ICD-10-CM   1. Research subject Z00.6   2. 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 

## 2023-05-23 NOTE — Progress Notes (Signed)
 Title: A Phase 2 Multicenter, Randomized, Double-blind, Placebo Controlled Study to Evaluate the Safety, Tolerability, Pharmacokinetics, and Efficacy of TTI-101 in Participants with Idiopathic Pulmonary Fibrosis    Dose and Duration of Treatment: 75 participants will be enrolled using a 1:1:1 randomization ratio over a 20 week period (4 week screening period, 12 week treatment period and a 4 week follow-up period).   25 participants: TTI-101 400 mg/day   25 participants: TTI-101 800 mg/day   25 participants: placebo   Protocol # TVD-101-003P    Sponsor: [employer], [address] [place], [place] [address] [place]    Xxxx This visit for Subject Elizabeth Espinoza with DOB: 04-Oct-1940 on 05/23/2023 for the above protocol is Visit/Encounter # research  and is for purpose of post randomization visit . Subject/LAR expressed continued interest and consent in continuing as a study subject. Subject thanked for participation in research and contribution to science.    S: since April 27. 2025 having mild mid-day and earl evening nausea that is not dosing related but definitely started after randomization. Resolves by eating crackers or other food including meal. Also, known spondylolistehsis and since May 19, 2023 having back pain left lower area . No focal neuro deficits. Also on baby aspirin  and easily bruises normally but having new bruise Left neck 1.5cm and Rt arm lateral aspect 3cm that is due to aspirin . Otherwsise no new issues. Compliant with study drug  Exam  - brusie as above - no focal neuro deficits - RLL crackles  A REsearch subject IPF AE  - back pain - mild, unrelated onset 05/19/23 - burises x 2 - onset 05/19/23 urenalted  - Nausea - mild, onset 04/2723  - related to study drug  Plan  - per protocol - she plans to see surgeon for back pain  - adviesd food for nausea    SIGNATURE    Dr. Maire Scot, M.D., F.C.C.P, ACRP-CPI Pulmonary and  Critical Care Medicine Research Investigator, [facility] @ [facility] Health Staff Physician, [facility] Center Director - Interstitial Lung Disease  Program  Pulmonary Fibrosis [facility] [place] [facility] and [facility] @ [facility] [place], [place], [address]   Pager: [phone], If no answer  OR between  19:00-7:00h: page [phone]  [phone] Telephone (research): [phone]  10:14 AM 05/23/2023   10:14 AM 05/23/2023

## 2023-05-29 ENCOUNTER — Encounter

## 2023-06-03 ENCOUNTER — Encounter: Payer: Self-pay | Admitting: Internal Medicine

## 2023-06-03 NOTE — Progress Notes (Signed)
 She is in research study. IPF worse 2020 -> 2025 . Sligh possible progression since summer 2024

## 2023-06-05 DIAGNOSIS — M4310 Spondylolisthesis, site unspecified: Secondary | ICD-10-CM | POA: Diagnosis not present

## 2023-06-10 ENCOUNTER — Other Ambulatory Visit: Payer: Self-pay | Admitting: Internal Medicine

## 2023-06-13 ENCOUNTER — Encounter: Admitting: Internal Medicine

## 2023-06-13 ENCOUNTER — Telehealth: Payer: Self-pay | Admitting: Internal Medicine

## 2023-06-13 ENCOUNTER — Other Ambulatory Visit (INDEPENDENT_AMBULATORY_CARE_PROVIDER_SITE_OTHER)

## 2023-06-13 ENCOUNTER — Encounter

## 2023-06-13 DIAGNOSIS — R197 Diarrhea, unspecified: Secondary | ICD-10-CM

## 2023-06-13 DIAGNOSIS — Z006 Encounter for examination for normal comparison and control in clinical research program: Secondary | ICD-10-CM

## 2023-06-13 DIAGNOSIS — R110 Nausea: Secondary | ICD-10-CM

## 2023-06-13 DIAGNOSIS — R079 Chest pain, unspecified: Secondary | ICD-10-CM | POA: Diagnosis not present

## 2023-06-13 DIAGNOSIS — J84112 Idiopathic pulmonary fibrosis: Secondary | ICD-10-CM

## 2023-06-13 LAB — CBC WITH DIFFERENTIAL/PLATELET
Basophils Absolute: 0.1 10*3/uL (ref 0.0–0.1)
Basophils Relative: 1 % (ref 0.0–3.0)
Eosinophils Absolute: 0.5 10*3/uL (ref 0.0–0.7)
Eosinophils Relative: 5.8 % — ABNORMAL HIGH (ref 0.0–5.0)
HCT: 37.1 % (ref 36.0–46.0)
Hemoglobin: 12.5 g/dL (ref 12.0–15.0)
Lymphocytes Relative: 18.6 % (ref 12.0–46.0)
Lymphs Abs: 1.7 10*3/uL (ref 0.7–4.0)
MCHC: 33.6 g/dL (ref 30.0–36.0)
MCV: 88.7 fl (ref 78.0–100.0)
Monocytes Absolute: 0.8 10*3/uL (ref 0.1–1.0)
Monocytes Relative: 8.4 % (ref 3.0–12.0)
Neutro Abs: 6 10*3/uL (ref 1.4–7.7)
Neutrophils Relative %: 66.2 % (ref 43.0–77.0)
Platelets: 207 10*3/uL (ref 150.0–400.0)
RBC: 4.18 Mil/uL (ref 3.87–5.11)
RDW: 15.4 % (ref 11.5–15.5)
WBC: 9.1 10*3/uL (ref 4.0–10.5)

## 2023-06-13 LAB — COMPREHENSIVE METABOLIC PANEL WITH GFR
ALT: 19 U/L (ref 0–35)
AST: 24 U/L (ref 0–37)
Albumin: 3.7 g/dL (ref 3.5–5.2)
Alkaline Phosphatase: 72 U/L (ref 39–117)
BUN: 10 mg/dL (ref 6–23)
CO2: 21 meq/L (ref 19–32)
Calcium: 8.4 mg/dL (ref 8.4–10.5)
Chloride: 100 meq/L (ref 96–112)
Creatinine, Ser: 0.71 mg/dL (ref 0.40–1.20)
GFR: 79.35 mL/min (ref >60.00)
Glucose, Bld: 95 mg/dL (ref 70–99)
Potassium: 3.6 meq/L (ref 3.5–5.1)
Sodium: 130 meq/L — ABNORMAL LOW (ref 135–145)
Total Bilirubin: 0.6 mg/dL (ref 0.2–1.2)
Total Protein: 6.2 g/dL (ref 6.0–8.3)

## 2023-06-13 LAB — MAGNESIUM: Magnesium: 1.9 mg/dL (ref 1.5–2.5)

## 2023-06-13 LAB — PHOSPHORUS: Phosphorus: 3.1 mg/dL (ref 2.3–4.6)

## 2023-06-13 NOTE — Telephone Encounter (Signed)
 Troponin still pending  I soke to her around 18:00h and she was without chest pain and nausea had eased off. Advised her that if any chest pain she should go to ER. Trop pending at that time and also now  Plan  - please call LAB 06/14/23 AM and check on troponin -> if elevated go to ER.  - any chest pain recurrence go to eR (sending to [place] and [facility] and Vera))

## 2023-06-13 NOTE — Telephone Encounter (Signed)
 Standard of care labs ordered for Chest pain, nausea, diarreha. I am oNOT in tomorrow. Can you monitor the labs and lmk via text because I will be at my uncl;'e funeral in [place]   Thanks    SIGNATURE    Dr. Maire Scot, M.D., F.C.C.P,  Pulmonary and Critical Care Medicine Staff Physician, [facility] Center Director - Interstitial Lung Disease  Program  Pulmonary Fibrosis [facility] at [facility] [place], [place], [address]   Pager: [phone], If no answer  -> Check AMION or Try [phone] Telephone (clinical office): [phone] Telephone (research): [phone]  11:31 AM 06/13/2023

## 2023-06-13 NOTE — Progress Notes (Addendum)
 Title: A Phase 2 Multicenter, Randomized, Double-blind, Placebo Controlled Study to Evaluate the Safety, Tolerability, Pharmacokinetics, and Efficacy of TTI-101 in Participants with Idiopathic Pulmonary Fibrosis    Dose and Duration of Treatment: 75 participants will be enrolled using a 1:1:1 randomization ratio over a 20 week period (4 week screening period, 12 week treatment period and a 4 week follow-up period).   25 participants: TTI-101 400 mg/day   25 participants: TTI-101 800 mg/day   25 participants: placebo   Protocol # TVD-101-003P    Sponsor: [employer], [address] [employer], [address] [place], [place] [address] [place]   Mechanism of action: TTI-101 is a small-molecule inhibitor of STAT3 (signal transducer and activator of transcription 3). STAT3 plays a major role in the cellular processes involved in fibrosis including fibroblast proliferation. TTI-101 binds tightly to STAT3 which prevents STAT3 recruitment to signaling complexes that contain activated tyrosine kinases, thereby preventing STAT3 phosphorylation on Y705 and STAT3 activation. Binding of TTI-101 to STAT3 also prevents STAT3 dimerization. As such, TTI-101 is expected to prevent or reverse progression of fibrosis in IPF patients.  Key Inclusion Criteria: Age >= 40 years  Diagnosed with IPF based on the 2018 or 2022 ATS/ERS/JRS/ALAT guidelines within 5 years  Chest HRCT performed within 12 months meeting requirements for IPF Extent of fibrosis > emphysematous changes on the HRCT Meeting all the following during screening confirmed by central review,   >40% of predicted FVC and a (FEV1)/FVC >=0.7  A predicted DLCO (Hb corrected) >25%  SpO2 >= 88% with up to 4L O2/min by pulse oximetry at rest  If currently receiving nintedanib, stable dose for >3 months  Key Exclusion Criteria: Known to have the following diseases at screening: Uncontrolled pulmonary hypertension, or pulmonary hypertension requiring  chronic medical treatment Congestive heart failure - NYHA Class III or IV   Drug-induced interstitial pneumonia, pneumoconiosis, hypersensitivity pneumonitis, or radiation pneumonitis   Collagen vascular disease with involvement of the lung, autoimmune disease with involvement of the lung, sarcoidosis, granulomatous disease Severe hypertension (BP >= 160/100 despite maximal therapy within 3 months of visit 1) Myocardial infarction, unstable cardiac angina, or history of thrombotic event within 6 months of screening QTcF >450 msec for men and >470 msec for women at screening.   History of significant/symptomatic bradycardia long QT syndrome or  Uncorrected hypomagnesemia or hypokalemia or Heart rate <50 bpm at rest   Unresolved respiratory tract infection within 4 weeks or an acute exacerbation of IPF within 3 months  Active cancer or a history of cancer with a significant risk of recurrence during the study.  eGFR <=56mL/min/1.73 m2, Hb<=10g/dL, UEA<5409/WJ1 , platelets <100,000/mm3 , serum total bilirubin >1.5 x ULN, liver enzymes >= 2 x ULN Receiving steroids > 10 mg/day of prednisolone or its equivalent within 2 weeks   Immunosuppressive agents within 4 weeks  Received pirfenidone  within 3 months, smoking or vaping within 3 months   Pharmacokinetics: (ADME) - Contraindications: The SEDD formulation for TTI-101 provided greater systemic exposures relative to the Labrasol/PEG formulation. The SEDD formulation decreased the pill burden while increasing systemic exposures. The half-life of the drug in participants generally ranged from 4-8 h. The half-life data are supportive of the BID dose schedule. The administration of TTI-101 is limited to investigational use only and limited to the oral route of administration. TTI-101 is contraindicated for use in participants with known hypersensitivity to any of the excipients. Special Warnings/Considerations: Insufficient experience exists with TTI-101  to provide comprehensive warning guidance beyond what is  standard for any investigational drug.  Interactions:  Studies indicated the potential for strong induction of CYP1A2 by TTI-101. As expected, TTI-101 did induce metabolism of pirfenidone , known to be primarily metabolized by CYP1A2. There was no interaction shown between TTI-101 and nintedanib. Drug Interaction Studies: [facility] conducted a DDI study in normal healthy volunteers given TTI-101 and either pirfenidone  or nintedanib. Serious Adverse Reactions Observed in Clinical studies: 32 participants out of 105 reported SAE. (30.5%). However all SAEs reported were from study 2016-0842 (participants with solid tumors). No SAEs were reported in the DDI study (Healthy volunteer). Majority of SAE were GI disorders.  Serious Adverse Reactions Observed Postmarketing: To date, TTI-101 has not been registered for use or marketed in any jurisdiction. Safety Data:  The safety data from study 2016-0842 demonstrates the most frequent AEs are GI-related ( nausea, vomiting, change in appetite, diarrhea, abdominal pain) and related to liver function (elevated AST and ALT).  Fewer AEs were reported with the SDD formulation. EKG - PR prolongation was seen in 1 out of 105 participants. (1%)    Overall Adverse events in total participants N=105 (%) Severity  Grade1-mild Grade2-Moderate Grade3-Severe Formulation 1  N=15 Formulation 2    N=47 Formulation 3 (SDD)  N=48*  Diarrhea 30 (28.6%) 24 (22.9%) 7 (6.7%) 8 (7.6%) 6 (40.0%) 21 (44.7%) 3 (6.3%)  Nausea 14 (13.3%) 9 (8.6%) 4 (3.8%) 1 (1.0%) 4 (26.7%) 7 (14.9%) 3 (6.3%)  Vomiting 7 (6.7%) 5 (4.8%) 2 (1.9%) - 3 (20.0%) 2 (4.3%) 2 (4.2%)   Abdominal pain 3 (2.9%) 2 (1.9%) 1 (1.0%) - 3 (20.0%) - -  Elevated ALT 7 (6.7%)   4 (3.8%)   3 (2.9%)   3 (2.9%) 2 (13.3%)  4 (8.5%)   1 (2.1%)   Elevated AST 6 (5.7%)   2 (1.9%)   4 (3.8%)   3 (2.9%) 1 (6.7%)  4 (8.5%)  1 (2.1%)   Headache 5 (4.8%) 5  (4.8%) - - - - 5 (10.4%)    TEAE from DDI study alone with Healthy Volunteer N=41 Part 1 nintedanib Part 2 Pirfenidone   Diarrhea  2 (4.9%)     1 (4.8%)  1 (5.0%)  Nausea 2 (4.9%)   1 (4.8%)   1 (5.0%)   Vomiting 1 (2.4%)     -  1 (5.0%)   Abdominal distension 1 (2.4%)     1 (4.8%)  -  Headache 5 (12.2%)   4 (19.0%)  1 (5.0%)    Stability:  The recommended storage condition for TTI-101 tablet, 200 mg and matching placebo is room temperature (20C to 25C or 79F to 90F). Excursions between 15C and 30C (65F and 23F) are allowed.    Xxxxxx This visit for Subject Elizabeth Espinoza with DOB: 11-Dec-1940 on 06/13/2023 for the above protocol is Visit/Encounter # post randomization visit  and is for purpose of reeserxh . Subject/LAR expressed continued interest and consent in continuing as a study subject. Subject thanked for participation in research and contribution to science.    Subjeive: Subject was randomized to study drug versus placebo 05/16/23.  She says for the first week she was doing well.  Then on 05/23/2023 she developed a plethora of symptoms that she still continues.  Many of them are progressive.  Some of them she is reported as severe.  Then yesterday 06/12/2018 5 in the morning she took her dose and for the last 24 hours she has not taken study drug.  So she skipped the dose last night  06/12/2023 and also this morning 06/13/2023.  Symptoms continue unresolved.  The symptoms are  1) back pain which is ongoing without change  2) diarrhea rated as severe.  She is not taking any medications for this.  It is still ongoing  3) change in appetite/diminished appetite/inability to eat full meal/not eaten in the last 3 days: Rated as moderate.  Progressive.  4) fatigue/feeling very tired/low energy: Ongoing rated as moderate  5) dysgeusia/abnormal taste sensation rated as mild and ongoing since start of adverse events  6) dry mouth: Rated as mild to moderate.  Ongoing  7) headache  reported as mild to moderate.  Ongoing intermittent  8) nausea rated as severe but no vomiting.  Associated with diminished p.o. intake.  Not taking any medicines  9_) atypical chest pain: Associated with nausea and being tired.  Appears randomly.  Not necessarily associated with rest or exertion.  Central in nature.  Moderate to severe in intensity. T wave nversion new in EKG. SOC TROPNIIN done stat  10) low sodium probably related to study drug and fatigue and diarrhea. -Mild abnormality but will be an adverse event.  Can explain fatigue.  Objective - Physical exam without change from baseline.  Also vital signs without any orthostatics and stable   A Recent subjective/IPF 9 adverse events as above -all likely related to study drug  Plan  - Research labs - Research EKG - Research 6-minute walk - Research pulmonary function test - Hold study drug till further notice - Subject will stay in touch with the principal investigator/coordinator and noted to go to the emergency room if feeling worse    ADDENCUM   1) -research delegated lab technician could not get blood draw and patient left.  Therefore we had to call her back.  Meanwhile she went to the standard of care lab and got standard of care labs drawn.-Stat troponin pending..  She later came back to research and I is the principal investigator on delegation try to do phlebotomy.  Stuck hjer 6 times but could not get blood draw  SIGNATURE    Dr. Maire Scot, M.D., F.C.C.P, ACRP-CPI Pulmonary and Critical Care Medicine Research Investigator, [facility] @ [facility] Health Staff Physician, [facility] Center Director - Interstitial Lung Disease  Program  Pulmonary Fibrosis [facility] - [place] Pulmonary and [facility] @ [facility] [place], [place], [address]   Pager: [phone], If no answer  OR between  19:00-7:00h: page [phone] Telephone (research): [phone]  11:27 AM 06/13/2023   11:27  AM 06/13/2023

## 2023-06-13 NOTE — Telephone Encounter (Signed)
 Elizabeth Espinoza -just as discussed on the phone.  Saw her during her recent visit.  With study drug she was having a lot of side effects that were consistent with study drug and this included nausea and diarrhea and fatigue but she also described atypical chest pain that seemed associated with nausea.  EKG showed T wave inversion in V5 and V6 that is new.  I have emailed that to you.  We have asked her to hold the study drug.  A stat troponin has been sent.  Her sodium is low and this can explain some of her symptoms.  I sent a stat troponin is still pending.   I am in touch with her as discussed and we will monitor   Thanks    SIGNATURE    Dr. Maire Scot, M.D., F.C.C.P,  Pulmonary and Critical Care Medicine Staff Physician, [facility] Center Director - Interstitial Lung Disease  Program  Pulmonary Fibrosis [facility] Network at [facility] [place], [place], [address]   Pager: [phone], If no answer  -> Check AMION or Try [phone] Telephone (clinical office): [phone] Telephone (research): [phone]  5:15 PM 06/13/2023

## 2023-06-13 NOTE — Telephone Encounter (Signed)
 I called the pt to make sure she knows to come in for labs- there was no answer- LMTCB

## 2023-06-14 LAB — TROPONIN I: Troponin I: 5 ng/L (ref <=47)

## 2023-06-14 NOTE — Telephone Encounter (Signed)
 Troponin levels are normal.  I am glad she is feeling better Please have her call back if there is any new complaints

## 2023-06-14 NOTE — Telephone Encounter (Signed)
 Troponin was 5-     Routing to provider of the day for recommendations  Please see other phone note from yesterday 06/13/23  Thank you!

## 2023-06-14 NOTE — Telephone Encounter (Signed)
 Called and spoke with the pt and notified of results per Dr Waylan Haggard. She verbalized understanding.

## 2023-06-14 NOTE — Telephone Encounter (Signed)
 I called [facility]  Troponin still pending  I let pt know this  She reports feeling much improved today without any CP, nausea  She is aware if these symptoms return to go to ED asap

## 2023-06-19 ENCOUNTER — Encounter: Admitting: Adult Health

## 2023-06-19 ENCOUNTER — Telehealth: Payer: Self-pay | Admitting: Internal Medicine

## 2023-06-19 ENCOUNTER — Other Ambulatory Visit (INDEPENDENT_AMBULATORY_CARE_PROVIDER_SITE_OTHER)

## 2023-06-19 ENCOUNTER — Encounter: Payer: Self-pay | Admitting: Adult Health

## 2023-06-19 DIAGNOSIS — T148XXA Other injury of unspecified body region, initial encounter: Secondary | ICD-10-CM

## 2023-06-19 DIAGNOSIS — R5383 Other fatigue: Secondary | ICD-10-CM | POA: Diagnosis not present

## 2023-06-19 DIAGNOSIS — R197 Diarrhea, unspecified: Secondary | ICD-10-CM

## 2023-06-19 DIAGNOSIS — R0609 Other forms of dyspnea: Secondary | ICD-10-CM

## 2023-06-19 DIAGNOSIS — R110 Nausea: Secondary | ICD-10-CM

## 2023-06-19 DIAGNOSIS — J849 Interstitial pulmonary disease, unspecified: Secondary | ICD-10-CM

## 2023-06-19 LAB — CBC
HCT: 38.7 % (ref 36.0–46.0)
Hemoglobin: 12.9 g/dL (ref 12.0–15.0)
MCHC: 33.4 g/dL (ref 30.0–36.0)
MCV: 87.3 fl (ref 78.0–100.0)
Platelets: 279 10*3/uL (ref 150.0–400.0)
RBC: 4.43 Mil/uL (ref 3.87–5.11)
RDW: 15.7 % — ABNORMAL HIGH (ref 11.5–15.5)
WBC: 8.8 10*3/uL (ref 4.0–10.5)

## 2023-06-19 LAB — PHOSPHORUS: Phosphorus: 3.2 mg/dL (ref 2.3–4.6)

## 2023-06-19 LAB — CK: Total CK: 41 U/L (ref 7–177)

## 2023-06-19 LAB — MAGNESIUM: Magnesium: 2.2 mg/dL (ref 1.5–2.5)

## 2023-06-19 NOTE — Telephone Encounter (Signed)
 Still feeling poortly -  nausea, fatigue, diarrhea , atypical chest pain- though improved but not resolved Still has some burising non specif attribute dto baby aspirin  Could not get research labs -s ticks failed at 1572 locatiojn - she failed sticks by technician  Plan - do standard of care of labs at [facility] including CK., trop, d-dimer  I ordered these FYI   SIGNATURE    Dr. Maire Scot, M.D., F.C.C.P,  Pulmonary and Critical Care Medicine Staff Physician, [facility] Center Director - Interstitial Lung Disease  Program  Pulmonary Fibrosis [facility] at [facility] [place], [place], [address]   Pager: [phone], If no answer  -> Check AMION or Try [phone] Telephone (clinical office): [phone] Telephone (research): [phone]  10:31 AM 06/19/2023

## 2023-06-19 NOTE — Telephone Encounter (Addendum)
 Added blood esr and sTOOL calprotectin -> called to update her but No response.

## 2023-06-19 NOTE — Telephone Encounter (Signed)
     Lynna Sarks d-dimer is HIGH and she had EKG change last week (Triop normal) and this persist this week., Despite stopping study drug for a week still feels poortly. She could have unrelated PE. I hve ordered CTA to be done today    SIGNATURE    Dr. Maire Scot, M.D., F.C.C.P,  Pulmonary and Critical Care Medicine Staff Physician, [facility] Center Director - Interstitial Lung Disease  Program  Pulmonary Fibrosis [facility] Network at [facility] [place], [place], [address]   Pager: [phone], If no answer  -> Check AMION or Try [phone] Telephone (clinical office): [phone] Telephone (research): [phone]  1:19 PM 06/19/2023

## 2023-06-19 NOTE — Addendum Note (Signed)
 Addended by: Maire Scot on: 06/19/2023 01:21 PM   Modules accepted: Orders

## 2023-06-19 NOTE — Telephone Encounter (Signed)
 I called and spoke with the pt and notified of results and recommendations from Dr Bertrum Brodie. She verbalized understanding. I have alerted the PCC's that it needs to be scheduled asap.

## 2023-06-19 NOTE — Telephone Encounter (Signed)
 Advise her to seek cardiology consult by calling her cardiologist but she has CTA 06/20/23  For rule out PE. She knows to go tER if needed/getting worse

## 2023-06-19 NOTE — Telephone Encounter (Signed)
 MR- she already had the labs done before you added these two additional tests How urgent is it that you need her to come back? She has ov here 06/25/23

## 2023-06-20 ENCOUNTER — Ambulatory Visit (INDEPENDENT_AMBULATORY_CARE_PROVIDER_SITE_OTHER)

## 2023-06-20 ENCOUNTER — Ambulatory Visit: Payer: Self-pay | Admitting: Internal Medicine

## 2023-06-20 ENCOUNTER — Ambulatory Visit (HOSPITAL_COMMUNITY)
Admission: RE | Admit: 2023-06-20 | Discharge: 2023-06-20 | Disposition: A | Discharge status: Home / Self Care | Source: Ambulatory Visit | Attending: Internal Medicine | Admitting: Internal Medicine

## 2023-06-20 ENCOUNTER — Telehealth: Payer: Self-pay | Admitting: Internal Medicine

## 2023-06-20 DIAGNOSIS — D84821 Immunodeficiency due to drugs: Secondary | ICD-10-CM | POA: Diagnosis not present

## 2023-06-20 DIAGNOSIS — R9431 Abnormal electrocardiogram [ECG] [EKG]: Secondary | ICD-10-CM | POA: Diagnosis not present

## 2023-06-20 DIAGNOSIS — R0609 Other forms of dyspnea: Secondary | ICD-10-CM

## 2023-06-20 DIAGNOSIS — R197 Diarrhea, unspecified: Secondary | ICD-10-CM | POA: Insufficient documentation

## 2023-06-20 DIAGNOSIS — R0989 Other specified symptoms and signs involving the circulatory and respiratory systems: Secondary | ICD-10-CM | POA: Diagnosis not present

## 2023-06-20 DIAGNOSIS — T148XXA Other injury of unspecified body region, initial encounter: Secondary | ICD-10-CM | POA: Diagnosis not present

## 2023-06-20 DIAGNOSIS — R110 Nausea: Secondary | ICD-10-CM | POA: Diagnosis not present

## 2023-06-20 DIAGNOSIS — M545 Low back pain, unspecified: Secondary | ICD-10-CM | POA: Diagnosis not present

## 2023-06-20 DIAGNOSIS — J849 Interstitial pulmonary disease, unspecified: Secondary | ICD-10-CM | POA: Insufficient documentation

## 2023-06-20 DIAGNOSIS — J8410 Pulmonary fibrosis, unspecified: Secondary | ICD-10-CM | POA: Diagnosis not present

## 2023-06-20 DIAGNOSIS — R5383 Other fatigue: Secondary | ICD-10-CM | POA: Diagnosis not present

## 2023-06-20 DIAGNOSIS — J9811 Atelectasis: Secondary | ICD-10-CM | POA: Diagnosis not present

## 2023-06-20 LAB — BASIC METABOLIC PANEL WITH GFR
BUN: 8 mg/dL (ref 6–23)
CO2: 22 meq/L (ref 19–32)
Calcium: 8.4 mg/dL (ref 8.4–10.5)
Chloride: 105 meq/L (ref 96–112)
Creatinine, Ser: 0.83 mg/dL (ref 0.40–1.20)
GFR: 65.78 mL/min (ref >60.00)
Glucose, Bld: 90 mg/dL (ref 70–99)
Potassium: 4.1 meq/L (ref 3.5–5.1)
Sodium: 136 meq/L (ref 135–145)

## 2023-06-20 MED ORDER — IOHEXOL 350 MG/ML SOLN
75.0000 mL | Freq: Once | INTRAVENOUS | Status: AC | PRN
  Administered 2023-06-20: 75 mL via INTRAVENOUS

## 2023-06-20 MED ORDER — SODIUM CHLORIDE (PF) 0.9 % IJ SOLN
INTRAMUSCULAR | Status: AC
  Filled 2023-06-20: qty 50

## 2023-06-20 NOTE — Telephone Encounter (Signed)
 Elizabeth Espinoza  CT results show no PE - good news - let her kjnw   Labs did not do BMET - can you see if they can do add on  Also let her know to be in touch with me  - and if next week if there is still diarrhea will do stool test   CT Angio Chest Pulmonary Embolism (PE) W or WO Contrast Result Date: 06/20/2023 CLINICAL DATA:  83 year old female with interstitial lung disease. Fatigue. Elevated D-dimer. EKG change. EXAM: CT ANGIOGRAPHY CHEST WITH CONTRAST TECHNIQUE: Multidetector CT imaging of the chest was performed using the standard protocol during bolus administration of intravenous contrast. Multiplanar CT image reconstructions and MIPs were obtained to evaluate the vascular anatomy. RADIATION DOSE REDUCTION: This exam was performed according to the departmental dose-optimization program which includes automated exposure control, adjustment of the mA and/or kV according to patient size and/or use of iterative reconstruction technique. CONTRAST:  75mL OMNIPAQUE  IOHEXOL  350 MG/ML SOLN COMPARISON:  High-resolution chest CT 05/21/2023 and earlier. FINDINGS: Cardiovascular: Good contrast bolus timing in the pulmonary arterial tree. Mild respiratory motion. No pulmonary artery filling defect identified. Calcified coronary artery atherosclerosis. Calcified aortic atherosclerosis. Normal heart size. No pericardial effusion. Mediastinum/Nodes: Reactive appearing mediastinal lymph nodes have not significantly changed since April. No mediastinal mass. Lungs/Pleura: Bilateral pulmonary fibrosis. Lower lung volumes compared to 05/15/2023. Major airways remain patent. Mild superimposed atelectasis. No convincing pleural effusion, no consolidation, no convincing active lung inflammation. Upper Abdomen: Stable, negative visible mostly noncontrast liver, gallbladder, spleen, pancreas, adrenal glands, kidneys, and bowel in the upper abdomen. Musculoskeletal: Stable heterogeneous bone mineralization throughout the visible  spine. Maintained vertebral height. No acute or suspicious osseous lesion identified. Review of the MIP images confirms the above findings. IMPRESSION: 1. Negative for acute pulmonary embolus. 2. Pulmonary fibrosis. Lower lung volumes compared to April with mild atelectasis. No convincing acute lung infection, pleural effusion. 3.  Aortic Atherosclerosis (ICD10-I70.0). Electronically Signed   By: Marlise Simpers M.D.   On: 06/20/2023 07:55

## 2023-06-20 NOTE — Progress Notes (Signed)
Good news!  No blood clot.

## 2023-06-20 NOTE — Telephone Encounter (Signed)
 BMET has been order as requested by Mariah Shines with [facility] via secure chat.

## 2023-06-20 NOTE — Telephone Encounter (Signed)
 Spoke with the pt  I made her aware of results and recommendations from MR  I am still waiting back to hear from Deb regarding lab add on for BMET  She states that her diarrhea has resolved  She is not c/o swelling around her eyes that has been going on for a week now  She says she slept with her hand on the left side of her face last night,  woke up today with skin tear on her cheek and "red spots" She wonders what is causing this and also asked if MR has spoken with Dr. Audery Blazing  Her appt is not until 6/3 with us   Can you please advise what to do? You may want to call her with this many concerns, thanks!

## 2023-06-20 NOTE — Patient Instructions (Signed)
Continue per research protocol

## 2023-06-20 NOTE — Progress Notes (Signed)
 @Patient  ID: Elizabeth Espinoza, female    DOB: 1940/02/16, 83 y.o.   MRN: [id number]  Chief Complaint  Patient presents with   Research    Referring provider: Colene Dauphin, MD  HPI:  Title: A Phase 2 Multicenter, Randomized, Double-blind, Placebo Controlled Study to Evaluate the Safety, Tolerability, Pharmacokinetics, and Efficacy of TTI-101 in Participants with Idiopathic Pulmonary Fibrosis    Dose and Duration of Treatment: 75 participants will be enrolled using a 1:1:1 randomization ratio over a 20 week period (4 week screening period, 12 week treatment period and a 4 week follow-up period).   25 participants: TTI-101 400 mg/day   25 participants: TTI-101 800 mg/day   25 participants: placebo   Protocol # [id number]    Sponsor: [employer], [address], [address] [place], [place] [address] [place]    Mechanism of action: TTI-101 is a small-molecule inhibitor of STAT3 (signal transducer and activator of transcription 3). STAT3 plays a major role in the cellular processes involved in fibrosis including fibroblast proliferation. TTI-101 binds tightly to STAT3 which prevents STAT3 recruitment to signaling complexes that contain activated tyrosine kinases, thereby preventing STAT3 phosphorylation on Y705 and STAT3 activation. Binding of TTI-101 to STAT3 also prevents STAT3 dimerization. As such, TTI-101 is expected to prevent or reverse progression of fibrosis in IPF patients.  Key Inclusion Criteria: Age >= 40 years  Diagnosed with IPF based on the 2018 or 2022 ATS/ERS/JRS/ALAT guidelines within 5 years  Chest HRCT performed within 12 months meeting requirements for IPF Extent of fibrosis > emphysematous changes on the HRCT Meeting all the following during screening confirmed by central review,   >40% of predicted FVC and a (FEV1)/FVC >=0.7  A predicted DLCO (Hb corrected) >25%  SpO2 >= 88% with up to 4L O2/min by pulse oximetry at rest  If currently  receiving nintedanib, stable dose for >3 months  Key Exclusion Criteria: Known to have the following diseases at screening: Uncontrolled pulmonary hypertension, or pulmonary hypertension requiring chronic medical treatment Congestive heart failure - NYHA Class III or IV   Drug-induced interstitial pneumonia, pneumoconiosis, hypersensitivity pneumonitis, or radiation pneumonitis   Collagen vascular disease with involvement of the lung, autoimmune disease with involvement of the lung, sarcoidosis, granulomatous disease Severe hypertension (BP >= 160/100 despite maximal therapy within 3 months of visit 1) Myocardial infarction, unstable cardiac angina, or history of thrombotic event within 6 months of screening QTcF >450 msec for men and >470 msec for women at screening.   History of significant/symptomatic bradycardia long QT syndrome or  Uncorrected hypomagnesemia or hypokalemia or Heart rate <50 bpm at rest   Unresolved respiratory tract infection within 4 weeks or an acute exacerbation of IPF within 3 months  Active cancer or a history of cancer with a significant risk of recurrence during the study.  eGFR <=14mL/min/1.73 m2, Hb<=10g/dL, XBJ<4782/NF6 , platelets <100,000/mm3 , serum total bilirubin >1.5 x ULN, liver enzymes >= 2 x ULN Receiving steroids > 10 mg/day of prednisolone or its equivalent within 2 weeks   Immunosuppressive agents within 4 weeks  Received pirfenidone  within 3 months, smoking or vaping within 3 months   Pharmacokinetics: (ADME) - Contraindications: The SEDD formulation for TTI-101 provided greater systemic exposures relative to the Labrasol/PEG formulation. The SEDD formulation decreased the pill burden while increasing systemic exposures. The half-life of the drug in participants generally ranged from 4-8 h. The half-life data are supportive of the BID dose schedule. The administration of TTI-101 is limited to investigational  use only and limited to the oral route  of administration. TTI-101 is contraindicated for use in participants with known hypersensitivity to any of the excipients. Special Warnings/Considerations: Insufficient experience exists with TTI-101 to provide comprehensive warning guidance beyond what is standard for any investigational drug.  Interactions:  Studies indicated the potential for strong induction of CYP1A2 by TTI-101. As expected, TTI-101 did induce metabolism of pirfenidone , known to be primarily metabolized by CYP1A2. There was no interaction shown between TTI-101 and nintedanib. Drug Interaction Studies: [facility] conducted a DDI study in normal healthy volunteers given TTI-101 and either pirfenidone  or nintedanib. Serious Adverse Reactions Observed in Clinical studies: 32 participants out of 105 reported SAE. (30.5%). However all SAEs reported were from study 2016-0842 (participants with solid tumors). No SAEs were reported in the DDI study (Healthy volunteer). Majority of SAE were GI disorders.  Serious Adverse Reactions Observed Postmarketing: To date, TTI-101 has not been registered for use or marketed in any jurisdiction. Safety Data:  The safety data from study [id number] demonstrates the most frequent AEs are GI-related ( nausea, vomiting, change in appetite, diarrhea, abdominal pain) and related to liver function (elevated AST and ALT).  Fewer AEs were reported with the SDD formulation. EKG - PR prolongation was seen in 1 out of 105 participants. (1%)    Overall Adverse events in total participants N=105 (%) Severity  Grade1-mild Grade2-Moderate Grade3-Severe Formulation 1  N=15 Formulation 2    N=47 Formulation 3 (SDD)  N=48*  Diarrhea 30 (28.6%) 24 (22.9%) 7 (6.7%) 8 (7.6%) 6 (40.0%) 21 (44.7%) 3 (6.3%)  Nausea 14 (13.3%) 9 (8.6%) 4 (3.8%) 1 (1.0%) 4 (26.7%) 7 (14.9%) 3 (6.3%)  Vomiting 7 (6.7%) 5 (4.8%) 2 (1.9%) - 3 (20.0%) 2 (4.3%) 2 (4.2%)   Abdominal pain 3 (2.9%) 2 (1.9%) 1 (1.0%) - 3 (20.0%) - -   Elevated ALT 7 (6.7%)   4 (3.8%)   3 (2.9%)   3 (2.9%) 2 (13.3%)  4 (8.5%)   1 (2.1%)   Elevated AST 6 (5.7%)   2 (1.9%)   4 (3.8%)   3 (2.9%) 1 (6.7%)  4 (8.5%)  1 (2.1%)   Headache 5 (4.8%) 5 (4.8%) - - - - 5 (10.4%)    TEAE from DDI study alone with Healthy Volunteer N=41 Part 1 nintedanib Part 2 Pirfenidone   Diarrhea  2 (4.9%)     1 (4.8%)  1 (5.0%)  Nausea 2 (4.9%)   1 (4.8%)   1 (5.0%)   Vomiting 1 (2.4%)     -  1 (5.0%)   Abdominal distension 1 (2.4%)     1 (4.8%)  -  Headache 5 (12.2%)   4 (19.0%)  1 (5.0%)    Stability:  The recommended storage condition for TTI-101 tablet, 200 mg and matching placebo is room temperature (20C to 25C or 27F to 46F). Excursions between 15C and 30C (60F and 58F) are allowed.    06/19/23 Research Visit  This visit for Subject Elizabeth Espinoza with DOB: 1940/01/29 on 06/20/2023 for the above protocol is Visit/Encounter #    and is for purpose of research . Subject/LAR expressed continued interest and consent in continuing as a study subject. Subject thanked for participation in research and contribution to science.  Randomized to study drug versus placebo 05/16/23. She was seen last week with numerous symptoms of diarrhea, nausea, back pain, fatigue, no anorexia, dry mouth, bruising along face/neck. . She stopped study drug. Symptoms have improved with less diarrhea and nausea.  Still very fatigued, no energy, weak, back pain, bloating . She is planning on withdrawing from study.   Exam: see research documentation. Abdominal exam with no guarding or rebound.   A: Significant side effect profile with numerous AE -Diarrhea/nausea improved off study drug but remains weak, fatigue, low energy, bruising, back pain, epigastric bloating and chest tightness. Study labs/EKG pending .   EKG with nonspecific findings. With ongoing symptoms advised needs to be seen by PCP or ER/urgent care for ongoing evaluation.  Remain off study drug.  Withdrawal  from study per protocol.      Roena Clark, NP 06/19/23

## 2023-06-21 LAB — LACTIC ACID, PLASMA: LACTIC ACID: 1.5 mmol/L (ref 0.4–1.8)

## 2023-06-21 LAB — D-DIMER, QUANTITATIVE: D-Dimer, Quant: 1.61 ug{FEU}/mL — ABNORMAL HIGH (ref <0.50)

## 2023-06-25 ENCOUNTER — Encounter: Payer: Self-pay | Admitting: Internal Medicine

## 2023-06-25 ENCOUNTER — Ambulatory Visit: Admitting: Internal Medicine

## 2023-06-25 VITALS — HR 81 | Ht 65.0 in | Wt 137.5 lb

## 2023-06-25 DIAGNOSIS — R9431 Abnormal electrocardiogram [ECG] [EKG]: Secondary | ICD-10-CM

## 2023-06-25 DIAGNOSIS — J84112 Idiopathic pulmonary fibrosis: Secondary | ICD-10-CM | POA: Diagnosis not present

## 2023-06-25 DIAGNOSIS — J209 Acute bronchitis, unspecified: Secondary | ICD-10-CM

## 2023-06-25 DIAGNOSIS — S0083XD Contusion of other part of head, subsequent encounter: Secondary | ICD-10-CM

## 2023-06-25 DIAGNOSIS — R5383 Other fatigue: Secondary | ICD-10-CM

## 2023-06-25 DIAGNOSIS — R7989 Other specified abnormal findings of blood chemistry: Secondary | ICD-10-CM

## 2023-06-25 DIAGNOSIS — K117 Disturbances of salivary secretion: Secondary | ICD-10-CM

## 2023-06-25 DIAGNOSIS — G4700 Insomnia, unspecified: Secondary | ICD-10-CM

## 2023-06-25 MED ORDER — AZITHROMYCIN 250 MG PO TABS: ORAL_TABLET | ORAL | 0 refills | Status: DC

## 2023-06-25 MED ORDER — PREDNISONE 10 MG PO TABS
ORAL_TABLET | ORAL | 0 refills | Status: DC
Start: 2023-06-25 — End: 2023-07-16

## 2023-06-25 NOTE — Progress Notes (Signed)
 IOV 12/04/2017: Dr Baldwin Levee: Ms. Zandi is a 83 year old never smoker with a history of allergic rhinitis, GERD, hypothyroidism, MVP.  She is been seen in our office in the past by Dr. Bennetta Braun for restless leg syndrome-she still treated for this with Requip . On allegra, nexium  prn.   She is referred today for dyspnea.  She reports that she has had an exercise routine where she walked w her husband 2 miles, exercises at the [facility], for several years. She has noticed more SOB, especially with mild hills. Sometimes now has to stop briefly to complete the walk. Occasional chest tightness, no wheeze. No overt CP. She reports daily cough, often in the am, sometimes clear mucous. She is on allegra. She had a walking stress test 09/2014 > reassuring ECG w exercise. Weight has been stable. Her last TSH was 06/25/17, 0.73.   Review of her notes shows hx possible GGI on prior CT 2014.   ROV 02/26/18 [name] --83 year old never smoker with a history of mitral valve prolapse whom I saw earlier this year for exertional dyspnea and chronic cough.  Pulmonary function testing consistent with restriction.  She has GERD and allergic rhinitis both of which could be contributing to her chronic cough.  I performed a high-resolution CT scan of the chest on 1/20 and reviewed today, this shows some patchy confluent subpleural reticular disease and groundglass attenuation with some minimal traction bronchiectasis and no frank honeycomb change.  This reflects a progression compared with 2014 and is an NSIP pattern.  Walking oximetry at her last visit did not show any exertional desaturation. She is still coughing, is using fexofenadine, takes nexium  qod.   [place], had pesticide exposure.   ROV 08/26/2018 [name] --follow-up visit for 83 year old woman with a history of mitral valve prolapse, chronic cough, dyspnea with restrictive lung disease noted on pulmonary function testing.  High Res CT scan of the chest  shows some mild interstitial disease.  She was also having cough - better with flonase; still on nexium , allegra. Her exertional tolerance is improved, she has been exercising more. She remains on Requip  for RLS.   We performed autoimmune labs last time, ANA positive at low titer (1: 80), SSA and SSB negative, RNP negative, RF negative, CCP negative, Anti-Smith negative, anti-SCL negative, DS DNA negative, anti-Jo negative, aldolase negative. She hasn't been seen in ILD clinic yet.    OV 10/08/2018  Subjective:  Patient ID: Yetta Helper, female , DOB: Jul 18, 1940 , age 83 y.o. , MRN: [id number] , ADDRESS: [address] [place] [address]   10/08/2018 -   Chief Complaint  Patient presents with   Interstitial Lung Disease    Breathing the same as it was during August 2020 office visit with Dr. Baldwin Levee     HPI Ishmael Marcus Valone 83 y.o. -has been referred to the interstitial lung disease clinic because of findings of interstitial lung disease.  History is gathered from talking to her, review of Dr. Denson Flake notes and also the integrated ILD questionnaire.  Briefly, she tells me that she was working out at [employer] and would just notice occasional dyspnea but she really did not compare it with other people.  Then in October 2019 she started walking 3 miles daily except on the days it rains with a husband.  During this time she noticed that she was falling really behind because of shortness of breath and exertional fatigue.  This  resulted in subsequent evaluation all documented above.  Findings of interstitial lung disease with subpleural reticulation suggestive of an alternative diagnosis.  Autoimmune profile essentially negative other than trace positive ANA.  She tells me that her significant major problem is exertional dyspnea when walking stairs or walking several miles.  She did not desaturate in our office several months ago.  She does not know if she desaturates when she exerts  walking 3 miles.  Pulmonary function test earlier this year is just isolated low DLCO.  She also has like a cough.  Overall since the onset of the symptoms by exercising herself more and conditioning she is somewhat better.  She did see Dr. Alexandria Angel in May 2020 and in June 2020 had a coronary calcium CT which appears to have no calcium deposits.   [place] Integrated Comprehensive ILD Questionnaire  Symptoms:  -Dyspnea started suddenly and since it started it is better.  She says it is been present for years although she did tell me that it is only there since October 2019 when she noticed that.  Severity is listed below.  She does have a cough almost 1 year.  Since it started it is better.  It is mostly in the morning.  She does bring up some phlegm.  Early on in the morning it is green or yellow.  Since it started it is the same/better.  There is no wheezing.  She does have some chest tightness with this when she walks.  It is relieved by rest.  Cardiac work-up in June 2020 showed no calcium deposits.   Past Medical History : Positive for chronic longstanding acid reflux disease and thyroid  disease..  In addition CT scan from January 2020 shows hiatal hernia that is small..  This presence of  sclerosis in the bony structures in January 2020.-Primary care physician has been sent a message today.  There is no asthma or COPD or heart failure rheumatoid arthritis or collagen vascular disease.  She does have GERD and hiatal hernia for several years to decades.  No sleep apnea.  No blood clots.  No hepatitis.  No tuberculosis.  No pleurisy.  , ANA positive at low titer (1: 80), SSA and SSB negative, RNP negative, RF negative, CCP negative, Anti-Smith negative, anti-SCL negative, DS DNA negative, anti-Jo negative, aldolase negative. She hasn't been seen in ILD clinic yet.    ROS:  -She does have fatigue for the last several years.  She does have some back and hip issues.  She does have dry eyes.  She  does have like some dysphagia.  There is presence of hiatal hernia-there is a small.  Acid reflux for several decades.  She also reports presence of nonspecific rash   FAMILY HISTORY of LUNG DISEASE: * -Her father died of mesothelioma in 38 83/1984 at the age of 41 otherwise no lung disease.   EXPOSURE HISTORY:   -When she was 17 she smokes cigarettes but otherwise no cigarette or tobacco use or electronic cigarette.  Never smoked marijuana.  No cocaine use no intravenous drug use.   HOME and HOBBY DETAILS :  -Single[place]family home [place] setting for the last 16 years in a 82 year old home.  No mold or mildew exposure in the [facility] duct or CPAP mask or humidifier.  No mold or mildew in the bathroom.  No pet birds in the house.  No misting [place].  No feather pillows no feather duvet.  No musical instruments.  She does some occasional gardening  which she likes.  She does do some fine-needle work.   OCCUPATIONAL HISTORY (122 questions) :  = Essentially negative except for the fact when she was a child she did some tobacco growing.  She has done home gardening for 50 years.    PULMONARY TOXICITY HISTORY (27 items):  denies  Results for IEASHA, BOEREMA (MRN [id number]) as of 11/07/2018 11:20  Ref. Range 02/26/2018 12:17  Anti Nuclear Antibody (ANA) Latest Ref Range: NEGATIVE  POSITIVE (A)  ANA Pattern 1 Unknown Nuclear, Speckled (A)  ANA Titer 1 Latest Units: titer 1:80 (H)  Anti JO-1 Latest Ref Range: 0.0 - 0.9 AI <0.2  Cyclic Citrullin Peptide Ab Latest Units: UNITS <16  ds DNA Ab Latest Units: IU/mL <1  RA Latex Turbid. Latest Ref Range: <14 IU/mL <14  ENA SM Ab Ser-aCnc Latest Ref Range: <1.0 NEG AI <1.0 NEG  Ribonucleic Protein(ENA) Antibody, IgG Latest Ref Range: <1.0 NEG AI <1.0 NEG  SSA (Ro) (ENA) Antibody, IgG Latest Ref Range: <1.0 NEG AI <1.0 NEG  SSB (La) (ENA) Antibody, IgG Latest Ref Range: <1.0 NEG AI <1.0 NEG  Scleroderma (Scl-70) (ENA) Antibody, IgG Latest Ref Range: <1.0  NEG AI <1.0 NEG   Results for VERNIA, TEEM (MRN [id number]) as of 11/07/2018 11:20  Ref. Range 01/29/2018 14:31  FVC-Pre Latest Units: L 2.97  FVC-%Pred-Pre Latest Units: % 98  Results for JAZZ, BIDDY (MRN [id number]) as of 11/07/2018 11:20  Ref. Range 01/29/2018 14:31  DLCO unc Latest Units: ml/min/mmHg 14.80  DLCO unc % pred Latest Units: % 54    OV 11/07/2018  Subjective:  Patient ID: Yetta Helper, female , DOB: 30-Jun-1940 , age 39 y.o. , MRN: [id number] , ADDRESS: [address] [place] [address]   11/07/2018 -   Chief Complaint  Patient presents with   Follow-up    Patient reports that she has sob with any exertion.    Follow-up interstitial lung disease  HPI GENEVA BARRERO 83 y.o. -last seen September 2020.  After that she was supposed to have follow-up high-resolution CT chest and spirometry DLCO.  For some reason the spirometry DLCO is not done.  In the interim her symptoms remain the same as shown by the symptom score below.  She does note when she does heavy exertion such as climbing stairs or long uphill walks her pulse ox drops to 84% but quickly regains.  She is not interested in portable oxygen.  She had a repeat high-resolution CT chest in September 2020 and when compared to January 2020 there is no significant change.  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  I personally visualized the film.  There is diffuse bilateral subpleural reticulation and some traction bronchiectasis.  I myself would say it may be this is indeterminate for UIP.  But there is some minimal air trapping and she is done some gardening work.  There is no upper zonal predominance that would fit in with hypersensitive pneumonitis.  She has incidental findings of bony sclerosis that is again repeated in the CT scan.  Her primary care physician Oma Bias evaluated her.  I reviewed the note.  She is been  referred to Dr. Amparo Balk in hematology who she is seeing November 12, 2018.  She is very confused about the fact that she has a bony sclerosis problem for which she is being referred to a blood doctor [hematologist] and she thought she  is having a blood test that was ordered by me.  I reviewed the chart and clarified this concepts   Ct Chest High Resolution  Result Date: 11/05/2018 CLINICAL DATA:  83 year old female with history of interstitial lung disease. Increased shortness of breath and cough over the past year. EXAM: CT CHEST WITHOUT CONTRAST TECHNIQUE: Multidetector CT imaging of the chest was performed following the standard protocol without intravenous contrast. High resolution imaging of the lungs, as well as inspiratory and expiratory imaging, was performed. COMPARISON:  High-resolution chest CT 02/10/2018. FINDINGS: Cardiovascular: Heart size is normal. There is no significant pericardial fluid, thickening or pericardial calcification. There is aortic atherosclerosis, as well as atherosclerosis of the great vessels of the mediastinum and the coronary arteries, including calcified atherosclerotic plaque in the left anterior descending and right coronary arteries. Mild calcifications of the mitral annulus. Mediastinum/Nodes: No pathologically enlarged mediastinal or hilar lymph nodes. Please note that accurate exclusion of hilar adenopathy is limited on noncontrast CT scans. Esophagus is unremarkable in appearance. No axillary lymphadenopathy. Lungs/Pleura: High-resolution images demonstrates some patchy areas of peripheral predominant septal thickening and subpleural reticulation, with mild cylindrical bronchiectasis and peripheral bronchiolectasis. No frank honeycombing confidently identified at this time. These findings have no definitive craniocaudal gradient. In the periphery of the mid to upper lungs there also some plaque-like areas of apparent pleuroparenchymal scarring and volume loss.  Inspiratory and expiratory imaging demonstrates minimal air trapping indicative of very mild small airways disease. Overall, these imaging findings appear stable compared to the prior study. Upper Abdomen: Aortic atherosclerosis. Musculoskeletal: Mild diffuse sclerosis throughout the visualized axial and appendicular skeleton, similar to prior examinations. There are no definite focal aggressive appearing lytic or blastic lesions noted in the visualized portions of the skeleton. IMPRESSION: 1. 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 2. Aortic atherosclerosis, in addition to 2 vessel coronary artery disease. Assessment for potential risk factor modification, dietary therapy or pharmacologic therapy may be warranted, if clinically indicated. 3. Persistent mild diffuse sclerosis throughout the visualized osseous structures without discrete aggressive appearing osseous lesions. Clinical correlation for signs and symptoms of potential infiltrative process such as myelofibrosis is suggested. Aortic Atherosclerosis (ICD10-I70.0). Electronically Signed   By: Alexandria Angel M.D.   On: 11/05/2018 14:36     OV 12/22/2018  Subjective:  Patient ID: Yetta Helper, female , DOB: 28-Oct-1940 , age 62 y.o. , MRN: [id number] , ADDRESS: [address] [place] [address]   12/22/2018 -   Chief Complaint  Patient presents with   Follow-up    Pt states she has been doing good since last visit. Pt is still coughing and will get up clear mucus in the morning.   Follow-up interstitial lung disease with CT scan October 2020 being indeterminate versus alternate diagnosis.  History of gardening.  Trace  autoimmune ANA positive.  Mild progression since 2014  HPI MALILLANY KAZLAUSKAS 83 y.o. -returns for follow-up.  She presents with her husband who I am meeting for the first time.  In the interim she met Dr. Luna Salinas thoracic surgeon for surgical lung biopsy.  Her husband also met with him.  I reviewed the note.  She tells me that given the morbidity with surgical lung biopsy she wants  to undergo bronchoscopy with lavage and transbronchial biopsy first.  She understands the inherent limitations of these procedure in terms of diagnosis.  But she wants to take the lower risk profile.  Overall she feels stable.  Her symptom score is improved compared to the past.  Her walking desaturation test shows exaggerated drop in pulse ox but this was done with her wearing the mask.  She did not feel any dyspnea.  In terms of her bony sclerosis she has seen Dr. Rosaline Coma at [facility].  I reviewed the note.  He has reassured her.  Risks of pneumothorax, hemothorax, sedation/anesthesia complications such as cardiac or respiratory arrest or hypotension, stroke and bleeding all explained. Benefits of diagnosis but limitations of non-diagnosis also explained. Patient verbalized understanding and wished to proceed.   They want to have the bronchoscopy after the holidays of Christmas and new year.  This is because their house is undergoing remodeling currently.       OV 03/16/2019  Subjective:  Patient ID: Yetta Helper, female , DOB: [birth year] , age 72 y.o. , MRN: [id number] , ADDRESS: [address] [place] [address]   03/16/2019 -   Chief Complaint  Patient presents with   Follow-up    PFT performed today.  Pt states she has been doing okay since last visit and states her breathing is about the same.     Finally able to review esults of envisia send out test to [place] . Date of test is 02/05/2019. 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 -returns for follow-up to discuss  bronchoscopy and lavage results.  In the interim no new respiratory issues.  They are all stable.  However she is having significant musculoskeletal issues with shoulder pain.  Serology from a year ago was negative.  They wanted to know about relatedness.  I told him it is probably not related.  We went over the bronchoscopy lavage results which showed some dominance of neutrophils consistent with UIP.  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   We had a long discussion about the benefits, risks and limitations of antifibrotic therapy.  We also discussed the choice between pirfenidone  and nintedanib.       Results for MACKENZEE, BECVAR (MRN [id number]) as of 03/16/2019 11:12  Ref. Range 01/29/2018 14:31 03/16/2019 08:42  FVC-Pre Latest Units: L 2.97 2.91  FVC-%Pred-Pre Latest Units: % 98 97   Results for KERBY, HOCKLEY (MRN [id number]) as of 03/16/2019 11:12  Ref. Range 01/29/2018 14:31 03/16/2019 08:42  DLCO unc Latest Units: ml/min/mmHg 14.80 12.32  DLCO unc % pred Latest Units: % 54 60     OV 06/11/2019 - telephine visit. Patient identified with 2 pHI, risks, benefit, limitations of tele visit explained  Subjective:  Patient ID: Yetta Helper, female , DOB: Jul 26, 1940 , age 4 y.o. , MRN: [id number] , ADDRESS: [address] [place] [address] ate of test is 02/05/2019. 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 -in this telephone visit patient is now on pirfenidone .  She says she is on pirfenidone  since mid April 2021.  She is on 3 pills 3 times a day.  She spacing  them 4 hours apart but having some intermittent nausea every few days.  She did have an urticaria but that was before she started pirfenidone  was related to Covid vaccine  that is now resolved.  She is asking about exercising at the [facility] and Covid precautions.  I advised her because she is vaccinated that the risk is low but not 0 and to take adequate precautions as tolerated.  She had liver function test and this is normal.  Her next appointment for pulmonary function test is in mid July 2021.  Recommended she make a face-to-face visit with me at that time.   OV 08/21/2019   Subjective:  Patient ID: Yetta Helper, female , DOB: 12/20/1940, age 33 y.o. years. , MRN: [id number],  ADDRESS: [address] [place] [address] PCP  Colene Dauphin, MD Providers : Treatment Team:  Attending Provider: Maire Scot, MD  Type of visit: Telephone Circumstance: COVID-19 national emergency Identification of patient Yetta Helper - 2 person identifier Risks: Risks, benefits, limitations of telephone visit explained Patient location: home This provider location: [facility]      Chief Complaint  Patient presents with   Follow-up    PFT 7/27--c/o sob with stairs and throat clearing mainly in the morning. stopped Esbriet  on 08/12/2019.   Follow-up interstitial lung disease with CT scan October 2020 being indeterminate versus alternate diagnosis.  History of gardening.  Trace autoimmune ANA positive.  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 -presents for this telephone visit for IPF.  On this telephone visit she was identified with 2 person identifier.  Risks, benefits and limitations of telephone visit explained.  After last visit her GI symptoms worsen.  She did see Dr. Lindie Reynolds gastroenterologist.  She underwent endoscopy.  Dr. Tova Fresh did send the results to me I do not have this.  I remember Dr. Tova Fresh calling me and discussing  patient's GI side effects.  These were from pirfenidone  so on August 12, 2019 based on my advise she start pirfenidone .  Since then her symptoms of GI nature have resolved.  Her respiratory symptoms continue to be stable.  These are all documented below.  At this point in time she feels that her quality of life is very important.  She does not want to go through the kind of GI side effect she went through with pirfenidone .  She would rather deal with the disease.  She is aware that nintedanib is a standard of care therapeutic option.  She is also aware after discussion the clinical trials as a care option particularly in the past to discovering new therapies that do not have this GI side effects that pirfenidone  poses.  She had a lot of questions about Covid vaccine.  She is fully vaccinated.  She is worried about her grandson was refusing the vaccine.      OV 09/29/2019   Subjective:  Patient ID: Yetta Helper, female , DOB: 1940/05/13, age 69 y.o. years. , MRN: [id number],  ADDRESS: [address] [place] [address] PCP  Colene Dauphin, MD Providers : Treatment Team:  Attending Provider: Maire Scot, MD   Chief Complaint  Patient presents with   Follow-up    LFT today?  pulmonary rehab or exercises.  inhaler trial, felt better after Advair.   Follow-up interstitial lung disease/IPF with CT scan October 2020 being indeterminate versus alternate diagnosis.  History of gardening.  Trace autoimmune ANA positive.  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 Date of test is 02/05/2019. 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  -Date of giving diagnosis is March 16, 2019  - MDD 05/05/19  - esbriet  since 05/06/19- stopped 08/12/19 due to severe GI side effect    HPI LUCETTA BAEHR 83 y.o. -    presesnts  With her husband for IPF followup.  Overall she is doing  well.  She is not taking pirfenidone  since July 2021.  After that she has gained weight.  All her GI symptoms have resolved.  She goes for daily walks.  She says when she reaches the top of the hill sometimes she desaturates to 88% but not always.  She monitors this closely.  She is asking for other exercises that can improve her endurance and lung function.  We discussed that exercises do not improve lung function.  She is willing to participate in pulmonary rehabilitation.  We discussed the alternative of taking nintedanib but because of the severe GI side effects that she had with Esbriet  she is nervous about starting nintedanib and does not want to.  I introduced her to the concept of clinical trials as a care option.  At this point in time she wants to process this information.  We did discuss the fact that IPF is a progressive disease and antifibrotic's are indicated early in the course of the disease.  She prefers to have supportive care approach.  She wants to focus on strengthening her overall physical shape.   OV 06/30/2021 -standard of care visit in this research patient.  Subjective:  Patient ID: Yetta Helper, female , DOB: [birth year] , age 16 y.o. , MRN: [id number] , ADDRESS: [address] [place] [address] PCP Colene Dauphin, MD Patient Care Team: Colene Dauphin, MD as PCP - General (Internal Medicine) Audery Blazing Deannie Fabian, MD as Consulting Physician (Cardiology) Denson Flake, MD as Consulting Physician (Pulmonary Disease) Jonathan Neighbor, [facility] as Pharmacist (Pharmacist) Virgina Grills, MD as Consulting Physician (Otolaryngology) [facility] as Consulting Physician (Ophthalmology)  This Provider for this visit: Treatment Team:  Attending Provider: Maire Scot, MD    06/30/2021 -   Chief Complaint  Patient presents with   Follow-up    Pt recently had a CT of her sinuses and is here to discuss the results.  Pt states she has not been  feeling well and states she is still having problems with her sinuses. States her breathing has been doing okay.     HPI SYANNA REMMERT 83 y.o. -on April 19, 2021 she did have some side effects from inhaled medical product.  After that we reduce the dose and slowly escalated back and then she was tolerating it fine.  On 05/22/2021 she was prescribed antibiotics for some sinus issues she did have week 8 study visit on 06/01/2021 and was doing okay.  But then on 06/20/2021 she started complaining of headache and sinus congestion and coughing and blowing yellowish mucus.  At this point in time she was taking inhaled treprostinil versus placebo investigational medical product at 4 times daily each time 12 puffs.  We told  her to reduce it to 9 puffs 4 times daily.  She tells me that perhaps she is marginally better after reducing the dose but she still having headaches.  She feels like her head is full particularly in the top part.  She also has significant cough.  She says during and after she takes the inhaled medical investigational product her cough score is 9 out of 10.  Rest of the day it is around 3 out of 10.  She coughs so much that she sometimes brings out mild yellow mucus that is like glue.  There is no diarrhea fatigue or weight loss or syncope or hemoptysis.  Definitely all the symptoms only after starting the research protocol.  She also some sore throat.  She has tried some remedial measures but these have not helped.  These include yogurt and ginger ale  For the dyspnea itself she feels stable.  She also tells me that she is able to walk a mile.  She is able to outplace her husband.  She feels her husband's declining health.  She says when she walks a mile she puts a finger probe after she rests and the pulse ox is 94%.  She is worried she might desaturate below 88%.  She does not have a forehead pulse oximeter.  She says the finger pulse ox meter does not stay on while she is walking.  I gave her a  contact of the patient support group leader to figure out a way to get a forehead probe.  Also showed her various finger proximal she can get that can stay on her finger while she exercises.  Told her the safety limit is greater than 88%.  Symptom scores below show that her symptoms are better than 2 years ago slightly.  This is from a respiratory standpoint with a cough is definitely worse after she went on the study.  And the headache is new.  She had a CT scan sinus that shows chronic findings.      OV 05/04/2022  Subjective:  Patient ID: Yetta Helper, female , DOB: Jul 23, 1940 , age 62 y.o. , MRN: [id number] , ADDRESS: [address] [place] [address] PCP Colene Dauphin, MD Patient Care Team: Colene Dauphin, MD as PCP - General (Internal Medicine) Audery Blazing Deannie Fabian, MD as Consulting Physician (Cardiology) Denson Flake, MD as Consulting Physician (Pulmonary Disease) Jonathan Neighbor, [facility] as Pharmacist (Pharmacist) Virgina Grills, MD as Consulting Physician (Otolaryngology) [facility] as Consulting Physician (Ophthalmology)  This Provider for this visit: Treatment Team:  Attending Provider: Maire Scot, MD  05/04/2022 -   Chief Complaint  Patient presents with   Follow-up    F/u after researched      HPI Ishmael Marcus Bour 83 y.o. -returns for routine follow-up.  In the interim she was on inhaled treprostinil versus placebo but she did not tolerate it at all.  She had all the classic side effects of the renal drug.  We made multiple attempts in holiday and giving the drug back but she did not tolerate it at all and she finally quit.  She is out of the study now.  She did do 1 year follow-up and exited the study.  Currently she is on supportive care.  She walks on Monday through Friday 3 miles a day.  This is through [facility] and she has gotten the best walker award.  She does have some chest burning when she starts the  walking but it  does get better as she walks further.  She is not limited by dyspnea.  She checks her pulse ox after the walk a few seconds later and the pulse ox is 93% 94% at the lowest.  I did tell her to monitor it while she walks.  In terms with therapy she is not interested in standard of care antifibrotic therapy because of side effect profile.  She tried pirfenidone  in the past.  She is also been intolerant to treprostinil.  She is absolutely not interested in nintedanib.  We talked about doing the revert study sponsored by [facility].  Is an oral pill.  She read the consent form which we gave it to her some weeks ago.  She does not want to do the study because of side effect profile.  There is another study called MOONSCAPE by [place].  Is an injection study.  Current patients are tolerating the study well so far.  She might be interested in this..  Will probably aim to recruit and Q3  2024.    OV 09/17/2022  Subjective:  Patient ID: Yetta Helper, female , DOB: [birth year] , age 53 y.o. , MRN: [id number] , ADDRESS: [address] [place] [address] PCP Colene Dauphin, MD Patient Care Team: Colene Dauphin, MD as PCP - General (Internal Medicine) Audery Blazing Deannie Fabian, MD as PCP - Cardiology (Cardiology) Audery Blazing Deannie Fabian, MD as Consulting Physician (Cardiology) Denson Flake, MD as Consulting Physician (Pulmonary Disease) Jonathan Neighbor, [facility] (Inactive) as Pharmacist (Pharmacist) Virgina Grills, MD as Consulting Physician (Otolaryngology) [facility] as Consulting Physician (Ophthalmology)  This Provider for this visit: Treatment Team:  Attending Provider: Maire Scot, MD    09/17/2022 -   Chief Complaint  Patient presents with   Follow-up    Breathing is about the same. She denies any new co's. She is walking 3 miles daily.    HPI VICTORIAN GUNN 83 y.o. -presents for follow-up.  She is currently on supportive care.  She started off the visit by saying  she was doing stable but later she did admit that when she does a 3 mile walk sometimes she gets dizziness but she is not checking her pulse ox but she is still able to finish her 3 miles at the same time.  She also when she does vacuuming she gets quite dyspneic.  No new medical problems other than the fact her leg and feet have chronic numbness and associated with restless legs. Interim Health status: No new complaints No new medical problems. No new surgeries. No ER visits. No Urgent care visits. No changes to medications.  Symptom scores and x-rays hypoxemia test remained stable but a pulmonary function test today shows a drop in DLCO.  We discussed this.  In terms of treatment she is only on supportive care after having failed antifibrotic's and previous intolerance to inhaled treprostinil study drug.  She is looking at the injection based therapy via [employer] protocol.       OV 10/23/2022  Subjective:  Patient ID: Yetta Helper, female , DOB: May 14, 1940 , age 20 y.o. , MRN: [id number] , ADDRESS: [address] [place] [address] PCP Colene Dauphin, MD Patient Care Team: Colene Dauphin, MD as PCP - General (Internal Medicine) Audery Blazing Deannie Fabian, MD as PCP - Cardiology (Cardiology) Audery Blazing Deannie Fabian, MD as Consulting Physician (Cardiology) Denson Flake, MD as Consulting Physician (Pulmonary Disease) Jonathan Neighbor, [facility] (  Inactive) as Pharmacist (Pharmacist) Virgina Grills, MD as Consulting Physician (Otolaryngology) [facility] as Consulting Physician (Ophthalmology)  This Provider for this visit: Treatment Team:  Attending Provider: Maire Scot, MD    10/23/2022 -   Chief Complaint  Patient presents with   Follow-up    Ct scan f/u      HPI TELESIA ATES 83 y.o. -returns for follow-up.  She continues a 3 mile walks.  She feels that the exertional shortness of breath that she was having during the 3 mile walk is somewhat better.   Reviewing of the symptom score tells me that she has been stable overall and it is hard to discern.  The big concern at last visit was a drop in pulmonary function test especially DLCO.  There was concern about pulmonary hypertension versus pulmonary fibrosis worsening so so we did BNP and this was elevated increasing concern for pulmonary hypertension but the echo shows only grade 1 diastolic dysfunction normal RV.  Were also concerned about worsening pulmonary fibrosis we did a high-resolution CT chest and radiology feels it has been stable for 2 years.  Stability in lung function is confirmed by stability and symptom score and also exercise hypoxemia test.  The big change she has there is a pulmonary function test.  This could be a red herring based on variations.  Nevertheless the BNP was high.  Therefore we will recheck this today.  She did mention to me that 7 or 10 days ago she did have an episode of palpitations while doing a 3 mile walk heart rate was 170 on the Fitbit and she had to stop.  No known atrial fibrillation diagnosis.  Same thing happened 2 months ago but she forgot to mention this to Dr. Audery Blazing on her visit early August 2024.  I have reached out to him via electronic message.  In terms of her pulmonary fibrosis therapy she is interested in other therapies that have minimal side effect profile.  The approved and antifibrotic's all have side effect profile.  She is interested experimental protocol.  We went over VIXARELIMAB monoclonal antibody given every few weeks.  There are some interesting data with prurigo.  I showed her that in the side effect profile.  She is taken the consent form.  She is interested in this at this point.     CT Chest data from date: 09/20/22  Narrative & Impression  CLINICAL DATA:  83 year old female with history of interstitial lung disease. Follow-up study.   EXAM: CT CHEST WITHOUT CONTRAST   TECHNIQUE: Multidetector CT imaging of the chest was  performed following the standard protocol without intravenous contrast. High resolution imaging of the lungs, as well as inspiratory and expiratory imaging, was performed.   RADIATION DOSE REDUCTION: This exam was performed according to the departmental dose-optimization program which includes automated exposure control, adjustment of the mA and/or kV according to patient size and/or use of iterative reconstruction technique.   COMPARISON:  High-resolution chest CT 11/14/2020.   FINDINGS: Cardiovascular: Heart size is normal. There is no significant pericardial fluid, thickening or pericardial calcification. There is aortic atherosclerosis, as well as atherosclerosis of the great vessels of the mediastinum and the coronary arteries, including calcified atherosclerotic plaque in the left anterior descending and right coronary arteries. Mild calcification of the mitral annulus.   Mediastinum/Nodes: No pathologically enlarged mediastinal or hilar lymph nodes. Please note that accurate exclusion of hilar adenopathy is limited on noncontrast CT scans. Esophagus is unremarkable in  appearance. No axillary lymphadenopathy.   Lungs/Pleura: Widespread areas of peripheral predominant septal thickening, subpleural reticulation, scattered mild cylindrical bronchiectasis and peripheral bronchiolectasis are again noted throughout the lungs bilaterally. No definite honeycombing noted. No discernible craniocaudal gradient. Overall, these findings appear essentially unchanged compared to the prior study. Inspiratory and expiratory imaging is unremarkable. No acute consolidative airspace disease. No definite suspicious appearing pulmonary nodules or masses are noted. Extensive bilateral apical pleuroparenchymal thickening and architectural distortion, similar to the prior study.   Upper Abdomen: Aortic atherosclerosis.   Musculoskeletal: There are no aggressive appearing lytic or blastic lesions  noted in the visualized portions of the skeleton.   IMPRESSION: 1. 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 Given the long-term stability, an alternative diagnosis such as fibrotic phase nonspecific interstitial pneumonia (NSIP) should also be considered. 2. Aortic atherosclerosis, in addition to left anterior descending and right coronary artery disease. Please note that although the presence of coronary artery calcium documents the presence of coronary artery disease, the severity of this disease and any potential stenosis cannot be assessed on this non-gated CT examination. Assessment for potential risk factor modification, dietary therapy or pharmacologic therapy may be warranted, if clinically indicated.   Aortic Atherosclerosis (ICD10-I70.0).     Electronically Signed   By: Alexandria Angel M.D.   On: 09/29/2022 12:00      Latest Reference Range & Units 09/17/22 12:05  Pro B Natriuretic peptide (BNP) 0.0 - 100.0 pg/mL 129.0 (H)  (H): Data is abnormally high   ECHO 09/18/22   IMPRESSIONS     1. Left ventricular ejection fraction, by estimation, is 55 to 60%. The  left ventricle has normal function. The left ventricle has no regional  wall motion abnormalities. Left ventricular diastolic parameters are  consistent with Grade I diastolic  dysfunction (impaired relaxation).   2. Right ventricular systolic function is normal. The right ventricular  size is normal.   3. Left atrial size was mildly dilated.   4. The mitral valve is normal in structure. Trivial mitral valve  regurgitation. No evidence of mitral stenosis.   5. The aortic valve is tricuspid. There is mild calcification of the  aortic valve. Aortic valve regurgitation is trivial. Aortic valve  sclerosis/calcification is present, without any evidence of aortic  stenosis.   6. The  inferior vena cava is normal in size with greater than 50%  respiratory variability, suggesting right atrial pressure of 3 mmHg.     OV 02/21/2023  Subjective:  Patient ID: Yetta Helper, female , DOB: [birth year] , age 28 y.o. , MRN: [id number] , ADDRESS: [address] [place] [address] PCP Colene Dauphin, MD Patient Care Team: Colene Dauphin, MD as PCP - General (Internal Medicine) Audery Blazing Deannie Fabian, MD as PCP - Cardiology (Cardiology) Audery Blazing Deannie Fabian, MD as Consulting Physician (Cardiology) Denson Flake, MD as Consulting Physician (Pulmonary Disease) Jonathan Neighbor, [facility] (Inactive) as Pharmacist (Pharmacist) Virgina Grills, MD as Consulting Physician (Otolaryngology) [facility] as Consulting Physician (Ophthalmology)  This Provider for this visit: Treatment Team:  Attending Provider: Maire Scot, MD    02/21/2023 -   Chief Complaint  Patient presents with   Follow-up    Pt states she still has a ongoing productive cough ( green phlegm ) states she is feeling stronger and less fatigue.     Aaron Aas  HPI Brandelyn Henne  Chanthavong 83 y.o. -returns for follow-up.  At this follow-up visit she is admitting that she is desaturating with exertion.  She notices when she works out on her exercise bike she goes down to 89 or 88%.  She stopped going to the [facility] because of pulse oximeter is alarming when she gets to 88% and she is doing it easily.  She says at that time she does not necessarily feel short of breath although at later time point she did admit for shortness of breath but overall symptom questionnaire shows continued stability but she is definitely desaturating.  She continues to have tachycardia review of external records indicate she is having SVTs.  Cardiology tried Lopressor  but she did not tolerate.  She has a historical tolerance issues with many medications.  She did tolerate the oral pill versus placebo on [place] study but she did not  tolerate standard of care Esbriet .  She did not tolerate inhaled treprostinil versus placebo in a study which we think she got the actual study drug..  Today exercise hypoxemia test shows worsening she is desaturating to 88%.  This something she had not done with similar exertion in the past.  I had a frank conversation with her I did indicate to her that I am really concerned the disease is progressive.  Indicated to her that she should strongly consider trying standard of care nintedanib.  I went over the diarrhea side effects.  She is apprehensive of the side effect but also concerned about the $2000 co-pay cost.  I told her this is standard under the new inflation reduction act.  She is still processing the information.  I told her that the advent of exercise hypoxemia is a negative prognostic sign.  Told her to consider at least option B  which is to get into a clinical trial with injection monoclonal antibody Vixarelimab versus placebo.  So far all patients are tolerating it well.  Short of the side effect profile.  She already took the consent last time but did not read it.  We are giving her the consent again.  Told her she can add standard of care nintedanib once she starts the study.  Least preferred option is to continue supportive care.   For her quality of life:  - recommend portoabl o2 - test ono      OV 03/14/2023  Subjective:  Patient ID: Yetta Helper, female , DOB: [birth year] , age 43 y.o. , MRN: [id number] , ADDRESS: [address] [place] [address] PCP Colene Dauphin, MD Patient Care Team: Colene Dauphin, MD as PCP - General (Internal Medicine) Audery Blazing Deannie Fabian, MD as PCP - Cardiology (Cardiology) Audery Blazing Deannie Fabian, MD as Consulting Physician (Cardiology) Denson Flake, MD as Consulting Physician (Pulmonary Disease) Jonathan Neighbor, [facility] (Inactive) as Pharmacist (Pharmacist) Virgina Grills, MD as Consulting Physician (Otolaryngology) [facility] as Consulting Physician (Ophthalmology)  This Provider for this visit: Treatment Team:  Attending Provider: Maire Scot, MD    03/14/2023 -   Chief Complaint  Patient presents with   Follow-up    HPI TESNEEM DUFRANE 83 y.o. -presents for acute visit follow-up.  After I saw her last time she had acute bronchitis and was desaturating ended up in the ER last Thursday had to call an antibiotic and prednisone .  She now presents with her husband.  She says she is a lot better but only 50% better.  She still has otalgia sore throat chest  being sore coughing colored sinus drainage.  She is finished with her doxycycline  she still has a few days of prednisone  left.  She wants a repeat course of antibiotic.  She says that she still desaturates at home walking 50 feet.  Of note last time we prescribed portable oxygen but it appears that she also got a oxygen concentrator at home.  She had a overnight pulse oximetry study and in this there is no desaturations past 4-1/2 minutes.  She is slightly bradycardic but she is on Lopressor .  Therefore I did advise her that she could return her concentrator and just retain her portable oxygen system.  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   OV 04/25/2023  Subjective:  Patient ID: Yetta Helper, female , DOB: Nov 25, 1940 , age 53 y.o. , MRN: [id number] , ADDRESS: [address] [place] [address] PCP Colene Dauphin, MD Patient Care Team: Colene Dauphin, MD as PCP - General (Internal Medicine) Audery Blazing Deannie Fabian, MD as PCP - Cardiology (Cardiology) Audery Blazing Deannie Fabian, MD as Consulting Physician (Cardiology) Denson Flake, MD as Consulting Physician (Pulmonary Disease) Jonathan Neighbor, [facility] (Inactive) as Pharmacist (Pharmacist) Virgina Grills,  MD as Consulting Physician (Otolaryngology) [facility] as Consulting Physician (Ophthalmology)  This Provider for this visit: Treatment Team:  Attending Provider: Maire Scot, MD    04/25/2023 -   Chief Complaint  Patient presents with   Follow-up    Breathing is doing well and she denies any co's today.     HPI JENAYE RICKERT 83 y.o. -returns for follow-up.  She states since the sinusitis she has improved.  She feels she no longer needs portable oxygen.  At the [facility] her husband states that she is walking a little slower but she does not desaturate.  A technician there help monitor her pulse ox when she walked 2 miles and she did not desaturate below 90%.  She feels back to baseline.  Symptom score have improved.  Exercise hypoxemia test here is stable.  She wanted to discuss next step in therapies.  She called the office a few days ago wanting to see a research consent.  She then met with the research coordinator yesterday and took to consent form for short-term phase 2, 20-28 weeks oral drug studies.  When she first came into the office she said she was going to do the study at least one of them.  Then later she said she wanted to do approved antifibrotic nintedanib.  In the past she had tried pirfenidone  but failed and swore because of the side effect she will never take an approved antifibrotic.  We then discussed nintedanib explained to her this is standard of care.  Explained to her that patient should always consider standard of care therapies first.  Explained to her that there is a 10-year safety profile on the medication.  Explained to her monitoring standards.  She then agreed to do nintedanib but just as I wrote the instructions she changed her mind.  She and her husband expressed cost concerns.  We went over the inflation reduction act and her co-pay.  She reflected on this.  She then wanted to know if she could do the study given the fact she was stable and  then take approved antifibrotic's few to  several weeks after starting a research protocol.  She feels that she will benefit from 2 medications.  We went over therapeutic misconception.  Explained to her that the purpose of the study especially phase 2 is to improve society and help develop new medications.  She believes will get monitoring with the study.  I agreed with this.  At the end she is going to reflect on all this.  She is to research concerns to look at.  We are going to check with the sponsor if she can get the study drug and then start approved antifibrotic.  She will also reflect if she wants to take the approved antifibrotic first and then do the study when she is stable on it for a few months.   OV 06/25/2023  Subjective:  Patient ID: Yetta Helper, female , DOB: 08-03-1940 , age 48 y.o. , MRN: [id number] , ADDRESS: [address] [place] [address] PCP Colene Dauphin, MD Patient Care Team: Colene Dauphin, MD as PCP - General (Internal Medicine) Audery Blazing Deannie Fabian, MD as PCP - Cardiology (Cardiology) Audery Blazing Deannie Fabian, MD as Consulting Physician (Cardiology) Denson Flake, MD as Consulting Physician (Pulmonary Disease) Jonathan Neighbor, [facility] (Inactive) as Pharmacist (Pharmacist) Virgina Grills, MD as Consulting Physician (Otolaryngology) [facility] as Consulting Physician (Ophthalmology)  This Provider for this visit: Treatment Team:  Attending Provider: Maire Scot, MD    06/25/2023 -   Chief Complaint  Patient presents with   Interstitial Lung Disease   Cough   Shortness of Breath    Follow-up interstitial lung disease/IPF with CT scan October 2020 being indeterminate versus alternate diagnosis.  History of gardening.  Trace autoimmune ANA positive.  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  Date of test is 02/05/2019. 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  -Date of giving diagnosis is March 16, 2019  - MDD 05/05/19  - esbriet  since 05/06/19- stopped 08/12/19 due to severe GI side effect   IPF patient on  -No approved antifibrotic based on personal choice and side effect profile -On inhaled treprostinil versus placebo [[place]] -phase 3 trial  -Consent February 2023  -Randomization April 06 2021 -> QUIT early due to side effects (presumably got real drug) -> completed 1 year follow-up spring 2024 -  - TVARDI REVERRT PHAE 2 study - end April 2025 - randmied  HPI NAVEAH BRAVE 83 y.o. -this is a standard of care visit.  This is not a research visit.  She was randomized to study drug on the TVARDI REVERT  study STAT3 inhibitor.  This was end of April 2025.  I saw her on 06/13/2023 during recent visit and she had multiple adverse events.  This is all documented.  We then had to stop the study drug.  Then a week later she did follow-up with nurse practitioner 06/19/2023 and she was still feeling poorly.  She also had new bruising in the left cheek that picked up 2 days prior to the nurse practitioner visit.  She is now here for standard of care visit.  She is not taking study drug.  At this visit she presents with her husband.  And she has multiple symptoms.  Specifically  -Nausea and diarrhea have resolved approximately 1 day after she saw nurse practitioner Franklin Ito on 06/19/2023  -Xerostomia dry mouth  still persists at a mild level  -Low appetite still continues.  Current severity scale is 4 out of 5.  This is improving this started a week after study drug but even without the study drug it still continues although it is improving  -She picked up a bruise in her left cheek 2 days after she saw Franklin Ito on 06/19/2023.  It is improving currently at a 7 cm longitudinal and 3 cm horizontal.  She believes it came from sleeping and rubbing her left wrist on the left cheek.  She believes it is  from aspirin .  I agree it is not from study drug  -Chest pains have resolved but she did have a standard of care EKG today and the T wave abnormalities still persist.  I have ordered an echocardiogram  -Fatigue: This still persist.  There is no change it is rated as 5 out of 5.  But she is able to get out of bed and do her ADLs.  At this point I am not sure it is related to study drug  -Hyponatremia has resolved.  Sodium was 130 on 06/13/2023.  Resolved on 06/20/2023  -She had positive D-dimer and then on 06/20/2023.  CT angiogram chest and there is no pulmonary embolism.   - She is indicating green sputum for the last 10 days very similar to urgent care visit.  I diagnosed acute bronchitis will give her azithromycin  and prednisone  5-day course each  -She is reporting insomnia for the last few weeks.  Sleeping only 2 to 3 hours each night.  Cause not sure.  Not related to study drug   SYMPTOM SCALE - ILD     Inhaled Tyvaso v Placebo. 05/04/2022 Supportive care  09/17/2022 Supporitve care 10/23/2022 Supportive care 02/21/2023 Supportive care 03/14/2023 Acute bronchitis sinusitis otherwise supportive care.  Just completed doxycycline .  Is on prednisone  taper. 04/25/2023  06/25/2023 Tvardid sutdy subject - pff study drug follwing several AE  Took study drug from 05/16/2023 through 06/12/2023  O2 use 10/08/2018  11/07/2018  12/22/2018  06/30/21         Shortness of Breath 0 -> 5 scale with 5 being worst (score 6 If unable to do)  ra ra ra ra ra ra ra ra  At rest 0 0 0 0 0 0 0 5 0 3   Simple tasks - showers, clothes change, eating, shaving 1 1 0 1 1 1.5 1 4 1 2   Household (dishes, doing bed, laundry) 2 2 2 3 1  for mos stuff to 5 with vaccuming (3) 1.5 (4 vaccuum) 1.5 3 4.5 3  Shopping 2 2 0.5 2 2 2 3  x 2.5   Walking level at own pace 2 1 0.5 1 1 1  x 4 1 2   Walking up Stairs 4 5 3  2.5 3.5 2.5 3.5 5 2 4   Total (40 - 48) Dyspnea Score 11 11 6  9.5 9.5 8.5 9 21 11 14   How bad is your cough? 1 3 2.5 5  3 2  3.5 5 0 3  How bad is your fatigue 2 2.5 1.5 am, 4.5 pm 4 2 2.5 4.5 4 1 5   nausea    1 0 0 1 0 0 4  vomit    0 00 0 0 00 0 0  diarrhea    0 0 0 0 0 0 0  anxiet    00 0 0 0 0 0 0  depression    0 0 0 0 0 0 0  pain    Headacne,, sinus complaints  0 0 0 0 0   Simple office walk 185 feet x  3 laps goal with forehead probe 06/30/2021  09/17/2022  10/23/2022  02/21/2023  02/21/2023  03/14/2023  04/25/2023   O2 used ra ra ra ra ra ra ra  Number laps completed 3 Sit stand x 10 Sit stand x 15   Sit stand x 15 Sit and stnd x 15  Comments about pace Fast pace  moderate   1 Tii 1 min and 2 sec  Resting Pulse Ox/HR 96% and 74/min 96% and HR 62 97% and HR 75 100% and HR 69 100% n 100% and HR 100 %and  70     98% and HR 89 88% and HR 103 96% after 2 LAPS on 2 LNC  95% and HR 82  Final Pulse Ox/HR 96% and 117/min 94% and HR 77 94% and HR 73      Desaturated </= 88% no no no      Desaturated <= 3% points n no yes      Got Tachycardic >/= 90/min yes no no      Symptoms at end of test No complaints No compalints No complaints    Level 5 dyspnea at peak  Miscellaneous comments Sx               SIT STAND TEST - goal 15 times   06/25/2023    O2 used ra   PRobe - finter or forehead finger   Number sit and stand completed - goal 15 15   Time taken to complete x   Resting Pulse Ox/HR/Dyspnea  97% and 81/min and dyspnea of 7/10    Peak measures 95 % and 100/min and dyspnea of 9/10   Final Pulse Ox/HR 90% and 88/min and dyspnea of 7/10   Desaturated </= 88% no   Desaturated <= 3% points yes   Got Tachycardic >/= 90/min yes   Miscellaneous comments dyspneiuc       PFT     Latest Ref Rng & Units 04/23/2023    7:54 AM 02/18/2023   11:05 AM 09/17/2022   10:11 AM 03/04/2020    1:00 PM 09/15/2019    5:13 PM 03/16/2019    8:42 AM 01/29/2018    2:31 PM  PFT Results  FVC-Pre L 2.88  2.77  2.78  3.07  2.92  2.91  2.97   FVC-Predicted Pre % 104  102  103  104  99  97  98   FVC-Post L       2.89    FVC-Predicted Post %       95   Pre FEV1/FVC % % 76  77  78  78  80  79  81   Post FEV1/FCV % %       83   FEV1-Pre L 2.18  2.12  2.18  2.39  2.34  2.29  2.40   FEV1-Predicted Pre % 106  106  108  108  106  102  105   FEV1-Post L       2.39   DLCO uncorrected ml/min/mmHg 9.99  9.27  8.80  12.94  12.84  12.32  14.80   DLCO UNC% % 50  47  45  63  63  60  54   DLCO corrected ml/min/mmHg 9.99  9.27  8.80  12.94  13.26  12.91    DLCO COR %Predicted % 50  47  45  63  65  63    DLVA Predicted % 63  57  62  70  71  73  68        LAB RESULTS last 96 hours No results found.       has a past medical history of Allergic rhinitis, GERD (gastroesophageal reflux disease), Glaucoma, Hiatal hernia, Hyperlipidemia, Hypothyroidism, ILD (interstitial lung disease) (HCC), Lichen planopilaris, MVP (mitral valve prolapse), Restless legs, and Urticaria.   reports that she has never smoked. She has never used smokeless tobacco.  Past Surgical History:  Procedure Laterality Date   APPENDECTOMY     with TAH   CARDIAC CATHETERIZATION  1989   negative   COLONOSCOPY  2005   Dr Grandville Lax   COLONOSCOPY  01/2013   Dr Tova Fresh   esophageal dilation  07/04/2011   Dr Rafaela Bunch SIGMOIDOSCOPY  1999    Dr Grandville Lax   KNEE ARTHROSCOPY  2001, 2005   Dr Abigail Abler   bilat   TOTAL ABDOMINAL HYSTERECTOMY     for Endometriosis; no BSO, Dr Alesia Husky   UPPER GI ENDOSCOPY  01/2013   Dr Tova Fresh   VIDEO BRONCHOSCOPY Bilateral 02/05/2019   Procedure: VIDEO BRONCHOSCOPY WITH FLUORO;  Surgeon: Maire Scot, MD;  Location: [facility] ENDOSCOPY;  Service: Endoscopy;  Laterality: Bilateral;    Allergies  Allergen Reactions   Caffeine     palpitations   Chlorpheniramine-Pseudoeph     palpitations   Maxifed     palpitations   Other Other (See Comments)   Pirfenidone  Nausea Only    Esbriet  Causes nausea and weakness    Immunization History  Administered Date(s) Administered   Fluad Quad(high Dose 65+) 11/15/2020,  11/14/2021, 11/23/2022   Influenza Split 11/07/2010   Influenza Whole 10/30/2009, 11/27/2011   Influenza, High Dose Seasonal PF 10/27/2012, 11/05/2013, 10/01/2017, 09/11/2018, 11/23/2019   Influenza,inj,quad, With Preservative 10/08/2016   Influenza-Unspecified 11/24/2014, 10/07/2015, 10/08/2016   PFIZER(Purple Top)SARS-COV-2 Vaccination 03/14/2019, 04/06/2019, 10/27/2019   Pneumococcal Conjugate-13 10/07/2015   Pneumococcal Polysaccharide-23 07/02/2017   Respiratory Syncytial Virus Vaccine ,Recomb Aduvanted(Arexvy ) 12/27/2021   Tdap 02/11/2012   Zoster Recombinant(Shingrix) 10/01/2017, 12/03/2017   Zoster, Live 01/09/2012    Family History  Problem Relation Age of Onset   Lung cancer Father        Asbestos with mesothelioma   Heart disease Paternal Grandmother        ? etiology   Diabetes Paternal Grandmother    Diabetes Brother    Uterine cancer Paternal Aunt    Stroke Paternal Grandfather        > 55   Heart attack Brother 31   Allergies Sister    Aneurysm Mother        congenital   Stroke Brother        two strokes   Barrett's esophagus Brother      Current Outpatient Medications:    aspirin  EC 81 MG tablet, Take 1 tablet (81 mg total) by mouth daily., Disp: 90 tablet, Rfl: 3   cetirizine (ZYRTEC) 10 MG tablet, Take 10 mg by mouth daily., Disp: , Rfl:    cholecalciferol (VITAMIN D3) 25 MCG (1000 UT) tablet, Take 1,000 Units by mouth daily., Disp: , Rfl:    clobetasol  (TEMOVATE ) 0.05 % external solution, , Disp: , Rfl:    fluticasone  (FLONASE) 50 MCG/ACT nasal spray, Place 1 spray into both nostrils daily as needed for allergies or rhinitis., Disp: , Rfl:    hydroxychloroquine  (PLAQUENIL ) 200 MG tablet, Take 1 tablet  by mouth twice a day, Disp: 60 tablet, Rfl: 9   magnesium  oxide (MAG-OX) 400 MG tablet, Take 400 mg by mouth daily., Disp: , Rfl:    Multiple Vitamins-Minerals (ZINC PO), Take 1 tablet by mouth., Disp: , Rfl:    pravastatin  (PRAVACHOL ) 40 MG tablet, TAKE 1  TABLET BY MOUTH DAILY GENERIC EQUIVALENT FOR PRAVACHOL , Disp: 90 tablet, Rfl: 2   Probiotic Product (PROBIOTIC ADVANCED) CAPS, Take by mouth., Disp: , Rfl:    SYNTHROID  88 MCG tablet, TAKE 1 TABLET DAILY 30 MINUTES BEFORE BREAKFAST, Disp: 90 tablet, Rfl: 3   vitamin B-12 (CYANOCOBALAMIN) 500 MCG tablet, Take 500 mcg by mouth daily., Disp: , Rfl:    azithromycin  (ZITHROMAX ) 250 MG tablet, 500mg  day 1 and then 250mg  daily next 4 days, Disp: 6 tablet, Rfl: 0   predniSONE  (DELTASONE ) 10 MG tablet, Please take prednisone  40 mg x1 day, then 30 mg x1 day, then 20 mg x1 day, then 10 mg x1 day, and then 5 mg x1 day and stop, Disp: 11 tablet, Rfl: 0      Objective:   Vitals:   06/25/23 1027  Pulse: 81  SpO2: 97%  Weight: 137 lb 8 oz (62.4 kg)  Height: 5\' 5"  (1.651 m)    Estimated body mass index is 22.88 kg/m as calculated from the following:   Height as of this encounter: 5\' 5"  (1.651 m).   Weight as of this encounter: 137 lb 8 oz (62.4 kg).  @WEIGHTCHANGE @  [employer]   06/25/23 1027  Weight: 137 lb 8 oz (62.4 kg)     Physical Exam   General: No distress. Looks  a bit tired O2 at rest: no Cane present: no Sitting in wheel chair: no Frail: no Obese: no Neuro: Alert and Oriented x 3. GCS 15. Speech normal Psych: Pleasant Resp:  Barrel Chest - no.  Wheeze - no, Crackles - YES BASE, No overt respiratory distress CVS: Normal heart sounds. Murmurs - no Ext: Stigmata of Connective Tissue Disease - no SKINL 7 cm vertically long x 3-4 cm horizontal bruise on left cheeck  HEENT: Normal upper airway. PEERL +. No post nasal drip        Assessment:       ICD-10-CM   1. IPF (idiopathic pulmonary fibrosis) (HCC)  J84.112 ECHOCARDIOGRAM COMPLETE    2. Acute bronchitis, unspecified organism  J20.9 ECHOCARDIOGRAM COMPLETE    3. T wave inversion in EKG  R94.31 ECHOCARDIOGRAM COMPLETE    4. Other fatigue  R53.83 ECHOCARDIOGRAM COMPLETE    5. Insomnia, unspecified type  G47.00  ECHOCARDIOGRAM COMPLETE    6. Xerostomia  K11.7 ECHOCARDIOGRAM COMPLETE    7. Bruise of face, subsequent encounter  S00.83XD ECHOCARDIOGRAM COMPLETE         Plan:     Patient Instructions  IPF (idiopathic pulmonary fibrosis) (HCC)   - Clinically this appears stable  Plan - Continue supportive care - Hold off restarting study drug via Tvardi's REVERT protocol - But remain in the study and do follow-up with study visits  Acute bronchitis, unspecified organism -new issue x 10 days  Plan  - Z-Pak - Prednisone  5-day taper course  T wave inversion in EKG -seen to research with normal troponins [May 2025]  Plan  - Get echocardiogram next few to several days if possible - Please to call Dr. Lourdes Roy office at cardiology and make an appointment  Fatigue Hyponatremia  -At this persists and I do not think any more this is related  to study drug and there is no longer related to low sodium - Hyponatremia has also resolved  Plan  - See if there is any clue in the echo report  Insomnia, unspecified type -ongoing times few weeks  -Do not think this is related to study drug  Plan - Monitor we will see if there is any clue in the echo report  Xerostomia  -This also is despite stopping the study drug - Therefore I do not think any more is related to study drug  Plan   - Try Biotene mouthwash  Bruise of face, subsequent encounter -onset 2 days before seeing Franklin Ito research visit  -Do not think this is related to study drug  Plan - Monitor [it is getting better]   Follow-up - 8 weeks standard of care visit [can cancel if there is a research visit - 30 min visit    FOLLOWUP Return in about 8 weeks (around 08/20/2023) for 30 min visit, with Dr Bertrum Brodie, Face to Face Visit.   ( Level 05 visit E&M 2024: Estb >= 40 min in  visit type: on-site physical face to visit  in total care time and counseling or/and coordination of care by this undersigned MD - Dr Maire Scot. This includes one or more of the following on this same day 06/25/2023: pre-charting, chart review, note writing, documentation discussion of test results, diagnostic or treatment recommendations, prognosis, risks and benefits of management options, instructions, education, compliance or risk-factor reduction. It excludes time spent by the CMA or office staff in the care of the patient. Actual time 40 min)    SIGNATURE    Dr. Maire Scot, M.D., F.C.C.P,  Pulmonary and Critical Care Medicine Staff Physician, [facility] Center Director - Interstitial Lung Disease  Program  Pulmonary Fibrosis [facility] at [facility] [place], [place], [address]  Pager: [phone], If no answer or between  15:00h - 7:00h: call [phone] Telephone: [phone]  11:12 AM 06/25/2023

## 2023-06-25 NOTE — Patient Instructions (Addendum)
 IPF (idiopathic pulmonary fibrosis) (HCC)   - Clinically this appears stable  Plan - Continue supportive care - Hold off restarting study drug via Tvardi's REVERT protocol - But remain in the study and do follow-up with study visits (she agreed)  Acute bronchitis, unspecified organism -new issue x 10 days  Plan  - Z-Pak - Prednisone  5-day taper course  T wave inversion in EKG -seen to research with normal troponins [May 2025]  - ongoing T wave abnormality on standard of EKG 06/25/2023 compared to  research eKG  Plan  - Get echocardiogram next few to several days if possible - Please to call Dr. Lourdes Roy office at cardiology and make an appointment (  I did d/w him)  Fatigue Hyponatremia  -At this persists and I do not think any more this is related to study drug and there is no longer related to low sodium - Hyponatremia has also resolved  Plan  - See if there is any clue in the echo report  Insomnia, unspecified type -ongoing times few weeks  -Do not think this is related to study drug  Plan - Monitor we will see if there is any clue in the echo report  Xerostomia  -This also is despite stopping the study drug - Therefore I do not think any more is related to study drug  Plan   - Try Biotene mouthwash  Bruise of face, subsequent encounter -onset 2 days before seeing Franklin Ito research visit  -Do not think this is related to study drug  Plan - Monitor [it is getting better]  Positive d-dmer 06/19/23  - CT angio negtive for PE - unrelated to study drug but an AE  Plan  - monitor    Follow-up - 8 weeks standard of care visit [can cancel if there is a research visit - 30 min visit

## 2023-06-26 DIAGNOSIS — M48061 Spinal stenosis, lumbar region without neurogenic claudication: Secondary | ICD-10-CM | POA: Diagnosis not present

## 2023-06-26 DIAGNOSIS — M4807 Spinal stenosis, lumbosacral region: Secondary | ICD-10-CM | POA: Diagnosis not present

## 2023-06-26 DIAGNOSIS — M47817 Spondylosis without myelopathy or radiculopathy, lumbosacral region: Secondary | ICD-10-CM | POA: Diagnosis not present

## 2023-06-26 DIAGNOSIS — M47816 Spondylosis without myelopathy or radiculopathy, lumbar region: Secondary | ICD-10-CM | POA: Diagnosis not present

## 2023-06-26 DIAGNOSIS — M4310 Spondylolisthesis, site unspecified: Secondary | ICD-10-CM | POA: Diagnosis not present

## 2023-07-02 ENCOUNTER — Ambulatory Visit (HOSPITAL_COMMUNITY)
Admission: RE | Admit: 2023-07-02 | Discharge: 2023-07-02 | Disposition: A | Discharge status: Home / Self Care | Source: Ambulatory Visit | Attending: Internal Medicine | Admitting: Internal Medicine

## 2023-07-02 DIAGNOSIS — S0083XD Contusion of other part of head, subsequent encounter: Secondary | ICD-10-CM | POA: Diagnosis not present

## 2023-07-02 DIAGNOSIS — J209 Acute bronchitis, unspecified: Secondary | ICD-10-CM | POA: Diagnosis not present

## 2023-07-02 DIAGNOSIS — R5383 Other fatigue: Secondary | ICD-10-CM | POA: Diagnosis not present

## 2023-07-02 DIAGNOSIS — J84112 Idiopathic pulmonary fibrosis: Secondary | ICD-10-CM | POA: Insufficient documentation

## 2023-07-02 DIAGNOSIS — G4700 Insomnia, unspecified: Secondary | ICD-10-CM | POA: Diagnosis not present

## 2023-07-02 DIAGNOSIS — R9431 Abnormal electrocardiogram [ECG] [EKG]: Secondary | ICD-10-CM | POA: Diagnosis not present

## 2023-07-02 DIAGNOSIS — K117 Disturbances of salivary secretion: Secondary | ICD-10-CM | POA: Diagnosis not present

## 2023-07-02 LAB — ECHOCARDIOGRAM COMPLETE
Area-P 1/2: 4.6 cm2
P 1/2 time: 592 ms
S' Lateral: 1.9 cm

## 2023-07-04 DIAGNOSIS — K080 Exfoliation of teeth due to systemic causes: Secondary | ICD-10-CM | POA: Diagnosis not present

## 2023-07-11 DIAGNOSIS — M4310 Spondylolisthesis, site unspecified: Secondary | ICD-10-CM | POA: Diagnosis not present

## 2023-07-12 ENCOUNTER — Telehealth: Payer: Self-pay | Admitting: Internal Medicine

## 2023-07-12 ENCOUNTER — Encounter: Admitting: Internal Medicine

## 2023-07-12 DIAGNOSIS — Z006 Encounter for examination for normal comparison and control in clinical research program: Secondary | ICD-10-CM

## 2023-07-12 DIAGNOSIS — J84112 Idiopathic pulmonary fibrosis: Secondary | ICD-10-CM

## 2023-07-12 NOTE — Telephone Encounter (Signed)
 Hi Elizabeth Espinoza was an oral study research drug protocol for IPF.  This can cause GI symptoms along with some headache some 2 weeks after starting this she started having the symptoms but she also had several other symptoms which are not commonly reported and this included fatigue and chest heaviness, dry mouth and altered taste and insomnia.  It is now been several weeks since she has been off the study drug.  While the GI symptoms have resolved several other symptoms still persist.  She also had T wave inversion.  We got an echocardiogram and there is new onset pericardial effusion.  Today the T wave inversion is resolved but she continues to have significant symptoms.  She says there is a long delay in getting to see you or someone at the office.  I am wondering if you can facilitate because if she is having pericarditis may be that her treatment implications. Study drug continues to be on hold   Thanks for consideration   SIGNATURE    Dr. Maire Scot, M.D., F.C.C.P,  Pulmonary and Critical Care Medicine Staff Physician, [facility] Center Director - Interstitial Lung Disease  Program  Pulmonary Fibrosis [facility] at [facility] [place], [place], [address]   Pager: [phone], If no answer  -> Check AMION or Try [phone] Telephone (clinical office): [phone] Telephone (research): [phone]  3:01 PM 07/12/2023

## 2023-07-12 NOTE — Progress Notes (Addendum)
 Title: A Phase 2 Multicenter, Randomized, Double-blind, Placebo Controlled Study to Evaluate the Safety, Tolerability, Pharmacokinetics, and Efficacy of TTI-101 in Participants with Idiopathic Pulmonary Fibrosis    Dose and Duration of Treatment: 75 participants will be enrolled using a 1:1:1 randomization ratio over a 20 week period (4 week screening period, 12 week treatment period and a 4 week follow-up period).   25 participants: TTI-101 400 mg/day   25 participants: TTI-101 800 mg/day   25 participants: placebo   Protocol # TVD-[id number]    Sponsor: [employer], [address] [employer], [address] [place], [place] [address] [place]   Mechanism of action: TTI-101 is a small-molecule inhibitor of STAT3 (signal transducer and activator of transcription 3). STAT3 plays a major role in the cellular processes involved in fibrosis including fibroblast proliferation. TTI-101 binds tightly to STAT3 which prevents STAT3 recruitment to signaling complexes that contain activated tyrosine kinases, thereby preventing STAT3 phosphorylation on Y705 and STAT3 activation. Binding of TTI-101 to STAT3 also prevents STAT3 dimerization. As such, TTI-101 is expected to prevent or reverse progression of fibrosis in IPF patients.  Key Inclusion Criteria: Age >= 40 years  Diagnosed with IPF based on the 2018 or 2022 ATS/ERS/JRS/ALAT guidelines within 5 years  Chest HRCT performed within 12 months meeting requirements for IPF Extent of fibrosis > emphysematous changes on the HRCT Meeting all the following during screening confirmed by central review,   >40% of predicted FVC and a (FEV1)/FVC >=0.7  A predicted DLCO (Hb corrected) >25%  SpO2 >= 88% with up to 4L O2/min by pulse oximetry at rest  If currently receiving nintedanib, stable dose for >3 months  Key Exclusion Criteria: Known to have the following diseases at screening: Uncontrolled pulmonary hypertension, or pulmonary hypertension requiring  chronic medical treatment Congestive heart failure - NYHA Class III or IV   Drug-induced interstitial pneumonia, pneumoconiosis, hypersensitivity pneumonitis, or radiation pneumonitis   Collagen vascular disease with involvement of the lung, autoimmune disease with involvement of the lung, sarcoidosis, granulomatous disease Severe hypertension (BP >= 160/100 despite maximal therapy within 3 months of visit 1) Myocardial infarction, unstable cardiac angina, or history of thrombotic event within 6 months of screening QTcF >450 msec for men and >470 msec for women at screening.   History of significant/symptomatic bradycardia long QT syndrome or  Uncorrected hypomagnesemia or hypokalemia or Heart rate <50 bpm at rest   Unresolved respiratory tract infection within 4 weeks or an acute exacerbation of IPF within 3 months  Active cancer or a history of cancer with a significant risk of recurrence during the study.  eGFR <=2mL/min/1.73 m2, Hb<=10g/dL, XBJ<4782/NF6 , platelets <100,000/mm3 , serum total bilirubin >1.5 x ULN, liver enzymes >= 2 x ULN Receiving steroids > 10 mg/day of prednisolone or its equivalent within 2 weeks   Immunosuppressive agents within 4 weeks  Received pirfenidone  within 3 months, smoking or vaping within 3 months   Pharmacokinetics: (ADME) - Contraindications: The SEDD formulation for TTI-101 provided greater systemic exposures relative to the Labrasol/PEG formulation. The SEDD formulation decreased the pill burden while increasing systemic exposures. The half-life of the drug in participants generally ranged from 4-8 h. The half-life data are supportive of the BID dose schedule. The administration of TTI-101 is limited to investigational use only and limited to the oral route of administration. TTI-101 is contraindicated for use in participants with known hypersensitivity to any of the excipients. Special Warnings/Considerations: Insufficient experience exists with TTI-101  to provide comprehensive warning guidance beyond what is  standard for any investigational drug.  Interactions:  Studies indicated the potential for strong induction of CYP1A2 by TTI-101. As expected, TTI-101 did induce metabolism of pirfenidone , known to be primarily metabolized by CYP1A2. There was no interaction shown between TTI-101 and nintedanib. Drug Interaction Studies: [facility] conducted a DDI study in normal healthy volunteers given TTI-101 and either pirfenidone  or nintedanib. Serious Adverse Reactions Observed in Clinical studies: 32 participants out of 105 reported SAE. (30.5%). However all SAEs reported were from study 2016-0842 (participants with solid tumors). No SAEs were reported in the DDI study (Healthy volunteer). Majority of SAE were GI disorders.  Serious Adverse Reactions Observed Postmarketing: To date, TTI-101 has not been registered for use or marketed in any jurisdiction. Safety Data:  The safety data from study 2016-0842 demonstrates the most frequent AEs are GI-related ( nausea, vomiting, change in appetite, diarrhea, abdominal pain) and related to liver function (elevated AST and ALT).  Fewer AEs were reported with the SDD formulation. EKG - PR prolongation was seen in 1 out of 105 participants. (1%)    Overall Adverse events in total participants N=105 (%) Severity  Grade1-mild Grade2-Moderate Grade3-Severe Formulation 1  N=15 Formulation 2    N=47 Formulation 3 (SDD)  N=48*  Diarrhea 30 (28.6%) 24 (22.9%) 7 (6.7%) 8 (7.6%) 6 (40.0%) 21 (44.7%) 3 (6.3%)  Nausea 14 (13.3%) 9 (8.6%) 4 (3.8%) 1 (1.0%) 4 (26.7%) 7 (14.9%) 3 (6.3%)  Vomiting 7 (6.7%) 5 (4.8%) 2 (1.9%) - 3 (20.0%) 2 (4.3%) 2 (4.2%)   Abdominal pain 3 (2.9%) 2 (1.9%) 1 (1.0%) - 3 (20.0%) - -  Elevated ALT 7 (6.7%)   4 (3.8%)   3 (2.9%)   3 (2.9%) 2 (13.3%)  4 (8.5%)   1 (2.1%)   Elevated AST 6 (5.7%)   2 (1.9%)   4 (3.8%)   3 (2.9%) 1 (6.7%)  4 (8.5%)  1 (2.1%)   Headache 5 (4.8%) 5  (4.8%) - - - - 5 (10.4%)    TEAE from DDI study alone with Healthy Volunteer N=41 Part 1 nintedanib Part 2 Pirfenidone   Diarrhea  2 (4.9%)     1 (4.8%)  1 (5.0%)  Nausea 2 (4.9%)   1 (4.8%)   1 (5.0%)   Vomiting 1 (2.4%)     -  1 (5.0%)   Abdominal distension 1 (2.4%)     1 (4.8%)  -  Headache 5 (12.2%)   4 (19.0%)  1 (5.0%)    [id number]  This visit for Subject Elizabeth Espinoza with DOB: 08-13-1940 on 07/12/2023 for the above protocol is Visit/Encounter # fllowup  and is for purpose of Resarh . Subject/LAR expressed continued interest and consent in continuing as a study subject. Subject thanked for participation in research and contribution to science.    S: Subject continues in the study but is not taking study drug.  She has a new finding of pericardial effusion on the echocardiogram but otherwise the echo was fine.  The echocardiogram shows very small pericardial effusion but by report it is a new finding.  She had T wave inversions associated with the current illness but today on EKG the T wave inversions have resolved.  In talking to her she says respiratory status is stable.  Her diarrhea is resolved but multitude other symptoms still continue  #Diminished appetite: Better but still present #Dysgeusia: Better but still continues #Headache: No change still continues #Dry mouth: Better but still continues #Insomnia still continues no better #Chest heaviness: Not  better still continues #Fatigue: Not better but continues #Bruise on the left neck: Increased  Echocardiogram small pericardial effusion EKG: Back to normal T wave inversions have all normalized.  QTc 454 ms but the degree of rates is less than 30 ms compared to screening    Exam done and positive for crackles and the scattered bruises on the left neck which to me looks the same       Latest Ref Rng & Units 07/12/23 researc NDD MAchine 4.10/25 - reearch 04/23/2023    7:54 AM 02/18/2023   11:05 AM 09/17/2022   10:11  AM 03/04/2020    1:00 PM 09/15/2019    5:13 PM 03/16/2019    8:42 AM 01/29/2018    2:31 PM  PFT Results  FVC-Pre L  2.903 2.88  2.77  2.78  3.07  2.92  2.91  2.97   FVC-Predicted Pre %   104  102  103  104  99  97  98   FVC-Post L         2.89   FVC-Predicted Post %         95   Pre FEV1/FVC % %   76  77  78  78  80  79  81   Post FEV1/FCV % %         83   FEV1-Pre L   2.18  2.12  2.18  2.39  2.34  2.29  2.40   FEV1-Predicted Pre %   106  106  108  108  106  102  105   FEV1-Post L         2.39   DLCO uncorrected ml/min/mmHg   9.99  9.27  8.80  12.94  12.84  12.32  14.80   DLCO UNC% %   50  47  45  63  63  60  54   DLCO corrected ml/min/mmHg 5.3  9.99  9.27  8.80  12.94  13.26  12.91    DLCO COR %Predicted %   50  47  45  63  65  63    DLVA Predicted %   63  57  62  70  71  73  68    326.7 meters, lowest pusle ox 87% at end, did on room air 330 meters, lowest puisle ox 84%            Pulmonary function test pending FVC Lab test done results will be pending 6-minute walk test showing NO decline in performance   A Research IPF - seems stable Several AEs as above: I am now wondering if other than the GI stuff if a lot of these areas were related to the pericardial effusion as she had pericarditis.  Cardiology appointment is still some ways off  Plan  - Continue to hold study drug - Continue study visits - Will reach out to cardiology  Proceed per protocol    SIGNATURE    Dr. Maire Scot, M.D., F.C.C.P, ACRP-CPI Pulmonary and Critical Care Medicine Research Investigator, [facility] @ [facility] Health Staff Physician, [facility] Center Director - Interstitial Lung Disease  Program  Pulmonary Fibrosis [facility] [place] [facility] and [facility] @ [facility] [place], [place], [address]   Pager: [phone], If no answer  OR between  19:00-7:00h: page [phone] Telephone (research): [phone]  2:35 PM 07/12/2023

## 2023-07-12 NOTE — Patient Instructions (Signed)
 ICD-10-CM   1. Research study patient  Z00.6     2. IPF (idiopathic pulmonary fibrosis) (HCC)  J84.112       Proceed per protocol

## 2023-07-15 ENCOUNTER — Encounter: Payer: Self-pay | Admitting: Cardiology

## 2023-07-16 ENCOUNTER — Ambulatory Visit: Attending: Physician Assistant | Admitting: Physician Assistant

## 2023-07-16 ENCOUNTER — Encounter: Payer: Self-pay | Admitting: Physician Assistant

## 2023-07-16 VITALS — BP 100/54 | HR 82 | Ht 65.0 in | Wt 132.4 lb

## 2023-07-16 DIAGNOSIS — I272 Pulmonary hypertension, unspecified: Secondary | ICD-10-CM | POA: Diagnosis not present

## 2023-07-16 DIAGNOSIS — I3139 Other pericardial effusion (noninflammatory): Secondary | ICD-10-CM

## 2023-07-16 DIAGNOSIS — J849 Interstitial pulmonary disease, unspecified: Secondary | ICD-10-CM | POA: Diagnosis not present

## 2023-07-16 DIAGNOSIS — I25119 Atherosclerotic heart disease of native coronary artery with unspecified angina pectoris: Secondary | ICD-10-CM | POA: Diagnosis not present

## 2023-07-16 DIAGNOSIS — E7800 Pure hypercholesterolemia, unspecified: Secondary | ICD-10-CM

## 2023-07-16 DIAGNOSIS — I2510 Atherosclerotic heart disease of native coronary artery without angina pectoris: Secondary | ICD-10-CM | POA: Diagnosis not present

## 2023-07-16 DIAGNOSIS — R9431 Abnormal electrocardiogram [ECG] [EKG]: Secondary | ICD-10-CM | POA: Diagnosis not present

## 2023-07-16 NOTE — Assessment & Plan Note (Signed)
 Continue follow-up with pulmonology as planned.

## 2023-07-16 NOTE — H&P (View-Only) (Signed)
 OFFICE NOTE:    Date:  07/16/2023  ID:  [name], DOB Oct 25, 1940, MRN [id number] PCP: Elizabeth Glade PARAS, MD  [facility] Providers Cardiologist:  Elizabeth Shallow, MD       Patient Profile:  Coronary artery Ca2+ ETT 10/13/14: low risk CCTA 07/21/18: CAC score 80 (52nd percentile); min nonobstructive CAD (LM 0-25, LAD 0-25) Palpitations Monitor 10/2022: Sinus bradycardia, normal sinus rhythm, sinus tachycardia, occasional PAC, short runs of SVT (longest 15 beats), occasional PVC and rare couplet. ILD (Interstitial Lung Disease)  TTE 09/18/22: EF 55-60, no RWMA, Gr 1 DD, NL RVSF, mild LAE, trivial MR, trivial AI, AV sclerosis, RAP 3  TTE 07/02/23: EF 60-65, no RWMA, mild pulm HTN, RVSP 41.7, small effusion, AV sclerosis, RAP 3  Hyperlipidemia  MVP   Aortic atherosclerosis Hyperlipidemia  Peripheral arterial disease  GERD       Discussed the use of AI scribe software for clinical note transcription with the patient, who gave verbal consent to proceed. History of Present Illness Elizabeth Espinoza is a 83 y.o. female who returns for evaluation of chest pain, shortness of breath. She was last seen by Elizabeth Espinoza in 02/2023. She was seen by Elizabeth Espinoza recently for pulmonology follow up and ECG was noted to show TW inversions. She was asked to follow up with Cardiology. Of note, follow up echocardiogram was obtained and demonstrated normal EF, RVSP 41.7, small pericardial effusion.  She has been experiencing worsening exertional chest discomfort and dyspnea over the past several weeks. The chest discomfort is described as a 'load on my chest' and occurs upon ambulation. Despite discontinuing a study medication (for ILD) three to four weeks ago due to side effects, her dyspnea has worsened. She experiences lightheadedness, especially when standing up or bending over, but no syncope. No palpitations or pleuritic pain. Sitting still provides some relief, but she feels unable to  perform activities like dusting due to the discomfort. She reports feeling very hot and then cold, but no fever, chills, hematochezia, or hematuria. She does not smoke or consume alcohol.  Review of Systems  Constitutional: Negative for fever.  Gastrointestinal:  Negative for hematochezia and melena.  Genitourinary:  Negative for hematuria.  -See HPI    Studies Reviewed:  EKG Interpretation Date/Time:  Tuesday July 16 2023 15:12:01 EDT Ventricular Rate:  82 PR Interval:  164 QRS Duration:  84 QT Interval:  370 QTC Calculation: 432 R Axis:   6  Text Interpretation: Normal sinus rhythm Low voltage QRS Nonspecific ST and T wave abnormality Confirmed by Elizabeth Espinoza) on 07/16/2023 3:31:26 PM   Results LABS Total cholesterol: 144 (09/15/2022) HDL: 59 (09/15/2022) LDL: 67 (09/15/2022) Triglycerides: 101 (09/15/2022)  Risk Assessment/Calculations:          Physical Exam:  VS:  BP (!) 100/54   Pulse 82   Ht 5' 5 (1.651 m)   Wt 132 lb 6.4 oz (60.1 kg)   SpO2 95%   BMI 22.03 kg/m        Wt Readings from Last 3 Encounters:  07/16/23 132 lb 6.4 oz (60.1 kg)  06/25/23 137 lb 8 oz (62.4 kg)  05/01/23 139 lb (63 kg)    Constitutional:      Appearance: Healthy appearance. Not in distress.  Neck:     Vascular: JVD normal.  Pulmonary:     Breath sounds: Normal breath sounds. No wheezing. No rales.  Cardiovascular:     Normal rate. Regular rhythm.  Murmurs: There is no murmur.     No rub.  Edema:    Peripheral edema absent.  Abdominal:     Palpations: Abdomen is soft.      Assessment and Plan:    Assessment & Plan Coronary artery disease involving native coronary artery of native heart with angina pectoris [place] [facility]) She has a hx of minimal non-obstructive coronary artery disease on CTA in June 2020. She now presents with progressively worsening exertional chest discomfort described as heaviness with minimal exertion. Her symptoms sound consistent with unstable  angina. No significant ischemic changes on EKG. recent echo with normal EF.  We discussed proceeding with right left heart catheterization versus repeat CCTA.  I favor right and left heart catheterization given how significant her symptoms are.  I reviewed this with Dr. Mona (attending MD) who agreed.  Through shared decision making, we agreed to proceed with cardiac catheterization. - Arrange right and left heart catheterization. - Continue aspirin  81 mg daily. - Continue pravastatin  40 mg daily. - Follow-up postcardiac catheterization.  ILD (interstitial lung disease) (HCC) Continue follow-up with pulmonology as planned. Pericardial effusion Small pericardial effusion noted on recent echocardiogram without evidence of tamponade.  Her symptoms do not sound consistent with pericarditis.  We will need to arrange a follow-up echocardiogram in the next 2 to 3 months to recheck. Pulmonary hypertension, unspecified (HCC) Likely related to interstitial lung disease. Hypercholesterolemia Current lipid levels: total cholesterol 144, HDL 59, LDL 67, triglycerides 101.  LDL is optimal. - Continue pravastatin  40 mg daily.     Informed Consent   Shared Decision Making/Informed Consent The risks [stroke (1 in 1000), death (1 in 1000), kidney failure [usually temporary] (1 in 500), bleeding (1 in 200), allergic reaction [possibly serious] (1 in 200)], benefits (diagnostic support and management of coronary artery disease) and alternatives of a cardiac catheterization were discussed in detail with Elizabeth Espinoza and she is willing to proceed.     Dispo:  Return for Post Procedure Follow Up.  Signed, Glendia Ferrier, PA-C

## 2023-07-16 NOTE — Progress Notes (Signed)
 OFFICE NOTE:    Date:  07/16/2023  ID:  [name], DOB Oct 25, 1940, MRN [id number] PCP: Geofm Glade PARAS, MD  [facility] Providers Cardiologist:  Redell Shallow, MD       Patient Profile:  Coronary artery Ca2+ ETT 10/13/14: low risk CCTA 07/21/18: CAC score 80 (52nd percentile); min nonobstructive CAD (LM 0-25, LAD 0-25) Palpitations Monitor 10/2022: Sinus bradycardia, normal sinus rhythm, sinus tachycardia, occasional PAC, short runs of SVT (longest 15 beats), occasional PVC and rare couplet. ILD (Interstitial Lung Disease)  TTE 09/18/22: EF 55-60, no RWMA, Gr 1 DD, NL RVSF, mild LAE, trivial MR, trivial AI, AV sclerosis, RAP 3  TTE 07/02/23: EF 60-65, no RWMA, mild pulm HTN, RVSP 41.7, small effusion, AV sclerosis, RAP 3  Hyperlipidemia  MVP   Aortic atherosclerosis Hyperlipidemia  Peripheral arterial disease  GERD       Discussed the use of AI scribe software for clinical note transcription with the patient, who gave verbal consent to proceed. History of Present Illness Elizabeth Espinoza is a 83 y.o. female who returns for evaluation of chest pain, shortness of breath. She was last seen by Dr. Shallow in 02/2023. She was seen by Dr. Geronimo recently for pulmonology follow up and ECG was noted to show TW inversions. She was asked to follow up with Cardiology. Of note, follow up echocardiogram was obtained and demonstrated normal EF, RVSP 41.7, small pericardial effusion.  She has been experiencing worsening exertional chest discomfort and dyspnea over the past several weeks. The chest discomfort is described as a 'load on my chest' and occurs upon ambulation. Despite discontinuing a study medication (for ILD) three to four weeks ago due to side effects, her dyspnea has worsened. She experiences lightheadedness, especially when standing up or bending over, but no syncope. No palpitations or pleuritic pain. Sitting still provides some relief, but she feels unable to  perform activities like dusting due to the discomfort. She reports feeling very hot and then cold, but no fever, chills, hematochezia, or hematuria. She does not smoke or consume alcohol.  Review of Systems  Constitutional: Negative for fever.  Gastrointestinal:  Negative for hematochezia and melena.  Genitourinary:  Negative for hematuria.  -See HPI    Studies Reviewed:  EKG Interpretation Date/Time:  Tuesday July 16 2023 15:12:01 EDT Ventricular Rate:  82 PR Interval:  164 QRS Duration:  84 QT Interval:  370 QTC Calculation: 432 R Axis:   6  Text Interpretation: Normal sinus rhythm Low voltage QRS Nonspecific ST and T wave abnormality Confirmed by Lelon Hamilton [phone]) on 07/16/2023 3:31:26 PM   Results LABS Total cholesterol: 144 (09/15/2022) HDL: 59 (09/15/2022) LDL: 67 (09/15/2022) Triglycerides: 101 (09/15/2022)  Risk Assessment/Calculations:          Physical Exam:  VS:  BP (!) 100/54   Pulse 82   Ht 5' 5 (1.651 m)   Wt 132 lb 6.4 oz (60.1 kg)   SpO2 95%   BMI 22.03 kg/m        Wt Readings from Last 3 Encounters:  07/16/23 132 lb 6.4 oz (60.1 kg)  06/25/23 137 lb 8 oz (62.4 kg)  05/01/23 139 lb (63 kg)    Constitutional:      Appearance: Healthy appearance. Not in distress.  Neck:     Vascular: JVD normal.  Pulmonary:     Breath sounds: Normal breath sounds. No wheezing. No rales.  Cardiovascular:     Normal rate. Regular rhythm.  Murmurs: There is no murmur.     No rub.  Edema:    Peripheral edema absent.  Abdominal:     Palpations: Abdomen is soft.      Assessment and Plan:    Assessment & Plan Coronary artery disease involving native coronary artery of native heart with angina pectoris [place] [facility]) She has a hx of minimal non-obstructive coronary artery disease on CTA in June 2020. She now presents with progressively worsening exertional chest discomfort described as heaviness with minimal exertion. Her symptoms sound consistent with unstable  angina. No significant ischemic changes on EKG. recent echo with normal EF.  We discussed proceeding with right left heart catheterization versus repeat CCTA.  I favor right and left heart catheterization given how significant her symptoms are.  I reviewed this with Dr. Mona (attending MD) who agreed.  Through shared decision making, we agreed to proceed with cardiac catheterization. - Arrange right and left heart catheterization. - Continue aspirin  81 mg daily. - Continue pravastatin  40 mg daily. - Follow-up postcardiac catheterization.  ILD (interstitial lung disease) (HCC) Continue follow-up with pulmonology as planned. Pericardial effusion Small pericardial effusion noted on recent echocardiogram without evidence of tamponade.  Her symptoms do not sound consistent with pericarditis.  We will need to arrange a follow-up echocardiogram in the next 2 to 3 months to recheck. Pulmonary hypertension, unspecified (HCC) Likely related to interstitial lung disease. Hypercholesterolemia Current lipid levels: total cholesterol 144, HDL 59, LDL 67, triglycerides 101.  LDL is optimal. - Continue pravastatin  40 mg daily.     Informed Consent   Shared Decision Making/Informed Consent The risks [stroke (1 in 1000), death (1 in 1000), kidney failure [usually temporary] (1 in 500), bleeding (1 in 200), allergic reaction [possibly serious] (1 in 200)], benefits (diagnostic support and management of coronary artery disease) and alternatives of a cardiac catheterization were discussed in detail with [name] and she is willing to proceed.     Dispo:  Return for Post Procedure Follow Up.  Signed, Glendia Ferrier, PA-C

## 2023-07-16 NOTE — Telephone Encounter (Signed)
 Follow up scheduled

## 2023-07-16 NOTE — Patient Instructions (Signed)
 Medication Instructions:  Your physician recommends that you continue on your current medications as directed. Please refer to the Current Medication list given to you today.  *If you need a refill on your cardiac medications before your next appointment, please call your pharmacy*  Lab Work: TODAY:  BMET & CBC  If you have labs (blood work) drawn today and your tests are completely normal, you will receive your results only by: MyChart Message (if you have MyChart) OR A paper copy in the mail If you have any lab test that is abnormal or we need to change your treatment, we will call you to review the results.  Testing/Procedures: Your physician has requested that you have a cardiac catheterization. Cardiac catheterization is used to diagnose and/or treat various heart conditions. Doctors may recommend this procedure for a number of different reasons. The most common reason is to evaluate chest pain. Chest pain can be a symptom of coronary artery disease (CAD), and cardiac catheterization can show whether plaque is narrowing or blocking your heart's arteries. This procedure is also used to evaluate the valves, as well as measure the blood flow and oxygen  levels in different parts of your heart. For further information please visit [email or web address] Please follow instruction sheet, BELOW:        Cardiac/Peripheral Catheterization   You are scheduled for a Cardiac Catheterization on Thursday, June 26 with Dr. Lonni End.  1. Please arrive at the [facility] (Main Entrance A) at [facility] - Closed: [address] [place], [place] [address] at 7:00 AM (This time is 2 hour(s) before your procedure to ensure your preparation).   Free valet parking service is available. You will check in at ADMITTING. The support person will be asked to wait in the waiting room.  It is OK to have someone drop you off and come back when you are ready to be discharged.        Special note: Every effort  is made to have your procedure done on time. Please understand that emergencies sometimes delay scheduled procedures.  2. Diet: Do not eat solid foods after midnight.  You may have clear liquids until 5 AM the day of the procedure.  3. Labs: You will need to have blood drawn on TODAY  4. Medication instructions in preparation for your procedure:   Contrast Allergy: No  On the morning of your procedure, take Aspirin  81 mg and any morning medicines NOT listed above.  You may use sips of water.  5. Plan to go home the same day, you will only stay overnight if medically necessary. 6. You MUST have a responsible adult to drive you home. 7. An adult MUST be with you the first 24 hours after you arrive home. 8. Bring a current list of your medications, and the last time and date medication taken. 9. Bring ID and current insurance cards. 10.Please wear clothes that are easy to get on and off and wear slip-on shoes.  Thank you for allowing us  to care for you!   --  Invasive Cardiovascular services   Follow-Up: At [facility], you and your health needs are our priority.  As part of our continuing mission to provide you with exceptional heart care, our providers are all part of one team.  This team includes your primary Cardiologist (physician) and Advanced Practice Providers or APPs (Physician Assistants and Nurse Practitioners) who all work together to provide you with the care you need, when you need it.  Your next appointment:   2 week(s)  Provider:   Redell Shallow, MD or Glendia Ferrier, PA-C          We recommend signing up for the patient portal called MyChart.  Sign up information is provided on this After Visit Summary.  MyChart is used to connect with patients for Virtual Visits (Telemedicine).  Patients are able to view lab/test results, encounter notes, upcoming appointments, etc.  Non-urgent messages can be sent to your provider as well.   To learn more about  what you can do with MyChart, go to [email or web address].   Other Instructions

## 2023-07-17 ENCOUNTER — Ambulatory Visit: Payer: Self-pay | Admitting: Physician Assistant

## 2023-07-17 LAB — CBC
Hematocrit: 40.5 % (ref 34.0–46.6)
Hemoglobin: 12.9 g/dL (ref 11.1–15.9)
MCH: 29 pg (ref 26.6–33.0)
MCHC: 31.9 g/dL (ref 31.5–35.7)
MCV: 91 fL (ref 79–97)
Platelets: 240 10*3/uL (ref 150–450)
RBC: 4.45 x10E6/uL (ref 3.77–5.28)
RDW: 14.1 % (ref 11.7–15.4)
WBC: 7.8 10*3/uL (ref 3.4–10.8)

## 2023-07-17 LAB — BASIC METABOLIC PANEL WITH GFR
BUN/Creatinine Ratio: 20 (ref 12–28)
BUN: 15 mg/dL (ref 8–27)
CO2: 20 mmol/L (ref 20–29)
Calcium: 9.5 mg/dL (ref 8.7–10.3)
Chloride: 106 mmol/L (ref 96–106)
Creatinine, Ser: 0.76 mg/dL (ref 0.57–1.00)
Glucose: 72 mg/dL (ref 70–99)
Potassium: 4.6 mmol/L (ref 3.5–5.2)
Sodium: 142 mmol/L (ref 134–144)
eGFR: 78 mL/min/{1.73_m2} (ref >59)

## 2023-07-18 ENCOUNTER — Ambulatory Visit (HOSPITAL_COMMUNITY)
Admission: RE | Admit: 2023-07-18 | Discharge: 2023-07-18 | Disposition: A | Discharge status: Home / Self Care | Attending: Internal Medicine | Admitting: Internal Medicine

## 2023-07-18 ENCOUNTER — Other Ambulatory Visit: Payer: Self-pay

## 2023-07-18 ENCOUNTER — Encounter (HOSPITAL_COMMUNITY)
Admission: RE | Disposition: A | Discharge status: Home / Self Care | Payer: Self-pay | Source: Home / Self Care | Attending: Internal Medicine

## 2023-07-18 DIAGNOSIS — R0609 Other forms of dyspnea: Secondary | ICD-10-CM | POA: Insufficient documentation

## 2023-07-18 DIAGNOSIS — I509 Heart failure, unspecified: Secondary | ICD-10-CM | POA: Insufficient documentation

## 2023-07-18 DIAGNOSIS — I272 Pulmonary hypertension, unspecified: Secondary | ICD-10-CM | POA: Diagnosis not present

## 2023-07-18 DIAGNOSIS — Z79899 Other long term (current) drug therapy: Secondary | ICD-10-CM | POA: Diagnosis not present

## 2023-07-18 DIAGNOSIS — E7800 Pure hypercholesterolemia, unspecified: Secondary | ICD-10-CM | POA: Diagnosis not present

## 2023-07-18 DIAGNOSIS — I25119 Atherosclerotic heart disease of native coronary artery with unspecified angina pectoris: Secondary | ICD-10-CM

## 2023-07-18 DIAGNOSIS — Z7982 Long term (current) use of aspirin: Secondary | ICD-10-CM | POA: Insufficient documentation

## 2023-07-18 DIAGNOSIS — I3139 Other pericardial effusion (noninflammatory): Secondary | ICD-10-CM | POA: Insufficient documentation

## 2023-07-18 DIAGNOSIS — I2510 Atherosclerotic heart disease of native coronary artery without angina pectoris: Secondary | ICD-10-CM | POA: Diagnosis not present

## 2023-07-18 DIAGNOSIS — J849 Interstitial pulmonary disease, unspecified: Secondary | ICD-10-CM | POA: Diagnosis not present

## 2023-07-18 HISTORY — PX: RIGHT/LEFT HEART CATH AND CORONARY ANGIOGRAPHY: CATH118266

## 2023-07-18 LAB — POCT I-STAT 7, (LYTES, BLD GAS, ICA,H+H)
Acid-base deficit: 3 mmol/L — ABNORMAL HIGH (ref 0.0–2.0)
Bicarbonate: 20.9 mmol/L (ref 20.0–28.0)
Calcium, Ion: 1.24 mmol/L (ref 1.15–1.40)
HCT: 32 % — ABNORMAL LOW (ref 36.0–46.0)
Hemoglobin: 10.9 g/dL — ABNORMAL LOW (ref 12.0–15.0)
O2 Saturation: 93 %
Potassium: 4.3 mmol/L (ref 3.5–5.1)
Sodium: 142 mmol/L (ref 135–145)
TCO2: 22 mmol/L (ref 22–32)
pCO2 arterial: 31.8 mmHg — ABNORMAL LOW (ref 32–48)
pH, Arterial: 7.425 (ref 7.35–7.45)
pO2, Arterial: 64 mmHg — ABNORMAL LOW (ref 83–108)

## 2023-07-18 LAB — POCT I-STAT EG7
Acid-base deficit: 2 mmol/L (ref 0.0–2.0)
Bicarbonate: 22.7 mmol/L (ref 20.0–28.0)
Calcium, Ion: 1.25 mmol/L (ref 1.15–1.40)
HCT: 33 % — ABNORMAL LOW (ref 36.0–46.0)
Hemoglobin: 11.2 g/dL — ABNORMAL LOW (ref 12.0–15.0)
O2 Saturation: 65 %
Potassium: 4.3 mmol/L (ref 3.5–5.1)
Sodium: 142 mmol/L (ref 135–145)
TCO2: 24 mmol/L (ref 22–32)
pCO2, Ven: 36.6 mmHg — ABNORMAL LOW (ref 44–60)
pH, Ven: 7.401 (ref 7.25–7.43)
pO2, Ven: 34 mmHg (ref 32–45)

## 2023-07-18 SURGERY — RIGHT/LEFT HEART CATH AND CORONARY ANGIOGRAPHY
Anesthesia: LOCAL

## 2023-07-18 MED ORDER — IOHEXOL 350 MG/ML SOLN
INTRAVENOUS | Status: DC | PRN
  Administered 2023-07-18: 30 mL

## 2023-07-18 MED ORDER — LABETALOL HCL 5 MG/ML IV SOLN: 10.0000 mg | INTRAVENOUS | Status: DC | PRN

## 2023-07-18 MED ORDER — FENTANYL CITRATE (PF) 100 MCG/2ML IJ SOLN
INTRAMUSCULAR | Status: DC | PRN
  Administered 2023-07-18: 12.5 ug via INTRAVENOUS

## 2023-07-18 MED ORDER — HEPARIN SODIUM (PORCINE) 1000 UNIT/ML IJ SOLN
INTRAMUSCULAR | Status: DC | PRN
Start: 2023-07-18 — End: 2023-07-18
  Administered 2023-07-18: 3000 [IU] via INTRAVENOUS

## 2023-07-18 MED ORDER — HEPARIN (PORCINE) IN NACL 1000-0.9 UT/500ML-% IV SOLN
INTRAVENOUS | Status: DC | PRN
Start: 2023-07-18 — End: 2023-07-18
  Administered 2023-07-18 (×3): 500 mL

## 2023-07-18 MED ORDER — MIDAZOLAM HCL 2 MG/2ML IJ SOLN
INTRAMUSCULAR | Status: AC
  Filled 2023-07-18: qty 2

## 2023-07-18 MED ORDER — LIDOCAINE HCL (PF) 1 % IJ SOLN
INTRAMUSCULAR | Status: DC | PRN
Start: 2023-07-18 — End: 2023-07-18
  Administered 2023-07-18 (×2): 2 mL via INTRADERMAL

## 2023-07-18 MED ORDER — ONDANSETRON HCL 4 MG/2ML IJ SOLN: 4.0000 mg | Freq: Four times a day (QID) | INTRAMUSCULAR | Status: DC | PRN

## 2023-07-18 MED ORDER — SODIUM CHLORIDE 0.9 % IV SOLN: INTRAVENOUS | Status: DC

## 2023-07-18 MED ORDER — MIDAZOLAM HCL 2 MG/2ML IJ SOLN
INTRAMUSCULAR | Status: DC | PRN
  Administered 2023-07-18: .5 mg via INTRAVENOUS

## 2023-07-18 MED ORDER — ASPIRIN 81 MG PO CHEW: 81.0000 mg | CHEWABLE_TABLET | ORAL | Status: DC

## 2023-07-18 MED ORDER — FENTANYL CITRATE (PF) 100 MCG/2ML IJ SOLN
INTRAMUSCULAR | Status: AC
  Filled 2023-07-18: qty 2

## 2023-07-18 MED ORDER — VERAPAMIL HCL 2.5 MG/ML IV SOLN
INTRAVENOUS | Status: DC | PRN
  Administered 2023-07-18: 10 mL via INTRA_ARTERIAL

## 2023-07-18 MED ORDER — HEPARIN SODIUM (PORCINE) 1000 UNIT/ML IJ SOLN
INTRAMUSCULAR | Status: AC
  Filled 2023-07-18: qty 10

## 2023-07-18 MED ORDER — LIDOCAINE HCL (PF) 1 % IJ SOLN
INTRAMUSCULAR | Status: AC
  Filled 2023-07-18: qty 30

## 2023-07-18 MED ORDER — HYDRALAZINE HCL 20 MG/ML IJ SOLN: 10.0000 mg | INTRAMUSCULAR | Status: DC | PRN

## 2023-07-18 MED ORDER — VERAPAMIL HCL 2.5 MG/ML IV SOLN
INTRAVENOUS | Status: AC
  Filled 2023-07-18: qty 2

## 2023-07-18 MED ORDER — ACETAMINOPHEN 325 MG PO TABS: 650.0000 mg | ORAL_TABLET | ORAL | Status: DC | PRN

## 2023-07-18 SURGICAL SUPPLY — 10 items
CATH 5FR JL3.5 JR4 ANG PIG MP (CATHETERS) IMPLANT
CATH BALLN WEDGE 5F 110CM (CATHETERS) IMPLANT
DEVICE RAD TR BAND REGULAR (VASCULAR PRODUCTS) IMPLANT
GLIDESHEATH SLEND SS 6F .021 (SHEATH) IMPLANT
GUIDEWIRE ANGLED .035X150CM (WIRE) IMPLANT
GUIDEWIRE INQWIRE 1.5J.035X260 (WIRE) IMPLANT
PACK CARDIAC CATHETERIZATION (CUSTOM PROCEDURE TRAY) ×1 IMPLANT
SET ATX-X65L (MISCELLANEOUS) IMPLANT
SHEATH GLIDE SLENDER 4/5FR (SHEATH) IMPLANT
SHEATH PROBE COVER 6X72 (BAG) IMPLANT

## 2023-07-18 NOTE — Interval H&P Note (Signed)
 History and Physical Interval Note:  07/18/2023 9:14 AM  Elizabeth Espinoza  has presented today for surgery, with the diagnosis of chest pain, shortness of breath, and interstitial lung disease.  The various methods of treatment have been discussed with the patient and family. After consideration of risks, benefits and other options for treatment, the patient has consented to  Procedure(s): RIGHT/LEFT HEART CATH AND CORONARY ANGIOGRAPHY (N/A) as a surgical intervention.  The patient's history has been reviewed, patient examined, no change in status, stable for surgery.  I have reviewed the patient's chart and labs.  Questions were answered to the patient's satisfaction.    Cath Lab Visit (complete for each Cath Lab visit)  Clinical Evaluation Leading to the Procedure:   ACS: No.  Non-ACS:    Anginal Classification: CCS III  Anti-ischemic medical therapy: No Therapy  Non-Invasive Test Results: No non-invasive testing performed  Prior CABG: No previous CABG  [name]

## 2023-07-19 ENCOUNTER — Encounter (HOSPITAL_COMMUNITY): Payer: Self-pay | Admitting: Internal Medicine

## 2023-07-21 DIAGNOSIS — I2510 Atherosclerotic heart disease of native coronary artery without angina pectoris: Secondary | ICD-10-CM | POA: Insufficient documentation

## 2023-07-21 NOTE — Progress Notes (Unsigned)
 Subjective:    Patient ID: Elizabeth Espinoza, female    DOB: 02-Feb-1940, 83 y.o.   MRN: [id number]      HPI Elizabeth Espinoza is here for a Physical exam and her chronic medical problems.    Doing okay-she continues to have shortness of breath with activity and that does limit her.  She is not exercising as regularly as she used to because of the shortness of breath.  Medications and allergies reviewed with patient and updated if appropriate.  Current Outpatient Medications on File Prior to Visit  Medication Sig Dispense Refill   aspirin  EC 81 MG tablet Take 1 tablet (81 mg total) by mouth daily. 90 tablet 3   carboxymethylcellulose 1 % ophthalmic solution Apply 1 drop to eye as needed (dry eyes).     cholecalciferol (VITAMIN D3) 25 MCG (1000 UT) tablet Take 1,000 Units by mouth daily.     clobetasol  (TEMOVATE ) 0.05 % external solution Apply 1 Application topically 2 (two) times daily.     Cyanocobalamin (VITAMIN B-12 PO) Take 2,500 mcg by mouth daily.     fluticasone  (FLONASE) 50 MCG/ACT nasal spray Place 1 spray into both nostrils daily.     hydroxychloroquine  (PLAQUENIL ) 200 MG tablet Take 1 tablet by mouth twice a day 60 tablet 9   loratadine (CLARITIN) 10 MG tablet Take 10 mg by mouth daily.     Magnesium  Oxide 250 MG TABS Take 500 mg by mouth daily.     pravastatin  (PRAVACHOL ) 40 MG tablet TAKE 1 TABLET BY MOUTH DAILY GENERIC EQUIVALENT FOR PRAVACHOL  90 tablet 2   sodium chloride  (BRONCHO SALINE ) inhaler solution Take 1 spray by nebulization at bedtime.     SYNTHROID  88 MCG tablet TAKE 1 TABLET DAILY 30 MINUTES BEFORE BREAKFAST 90 tablet 3   No current facility-administered medications on file prior to visit.    Review of Systems  Constitutional:  Negative for fever.  HENT:  Positive for postnasal drip.   Eyes:  Negative for visual disturbance.  Respiratory:  Positive for shortness of breath (DOE). Negative for cough and wheezing.   Cardiovascular:  Positive for palpitations (with  DOE). Negative for chest pain and leg swelling.  Gastrointestinal:  Negative for abdominal pain, blood in stool, constipation and diarrhea.       No gerd  Genitourinary:  Negative for dysuria.  Musculoskeletal:  Positive for back pain (chronic - intermittent - sees Dr Malcolm). Negative for arthralgias (mild).  Skin:  Negative for rash.  Neurological:  Positive for light-headedness. Negative for headaches.  Hematological:  Bruises/bleeds easily (bruising).  Psychiatric/Behavioral:  Positive for sleep disturbance. Negative for dysphoric mood. The patient is not nervous/anxious.        Objective:   Vitals:   07/22/23 0827  BP: 104/80  Pulse: 74  Temp: 98 F (36.7 C)  SpO2: 95%   Filed Weights   07/22/23 0827  Weight: 131 lb (59.4 kg)   Body mass index is 21.47 kg/m.  BP Readings from Last 3 Encounters:  07/22/23 104/80  07/18/23 116/81  07/16/23 (!) 100/54    Wt Readings from Last 3 Encounters:  07/22/23 131 lb (59.4 kg)  07/18/23 131 lb (59.4 kg)  07/16/23 132 lb 6.4 oz (60.1 kg)       Physical Exam Constitutional: She appears well-developed and well-nourished. No distress.  HENT:  Head: Normocephalic and atraumatic.  Right Ear: External ear normal. Normal ear canal and TM Left Ear: External ear normal.  Normal ear canal and  TM Mouth/Throat: Oropharynx is clear and moist.  Eyes: Conjunctivae normal.  Neck: Neck supple. No tracheal deviation present. No thyromegaly present.  No carotid bruit  Cardiovascular: Normal rate, regular rhythm and normal heart sounds.   No murmur heard.  No edema. Pulmonary/Chest: Effort normal.  No respiratory distress. She has no wheezes. She has mild bibasilar dry crackles.  Breast: deferred   Abdominal: Soft. She exhibits no distension. There is no tenderness.  Lymphadenopathy: She has no cervical adenopathy.  Skin: Skin is warm and dry. She is not diaphoretic.  Bruises on right arm/hand, left forearm and left side of  neck Psychiatric: She has a normal mood and affect. Her behavior is normal.     Lab Results  Component Value Date   WBC 7.8 07/16/2023   HGB 10.9 (L) 07/18/2023   HCT 32.0 (L) 07/18/2023   PLT 240 07/16/2023   GLUCOSE 72 07/16/2023   CHOL 144 08/27/2022   TRIG 101 08/27/2022   HDL 59 08/27/2022   LDLCALC 67 08/27/2022   ALT 19 06/13/2023   AST 24 06/13/2023   NA 142 07/18/2023   K 4.3 07/18/2023   CL 106 07/16/2023   CREATININE 0.76 07/16/2023   BUN 15 07/16/2023   CO2 20 07/16/2023   TSH 2.18 11/16/2022   INR 1.0 01/29/2019   HGBA1C 5.8 05/01/2022         Assessment & Plan:   Physical exam: Screening blood work  ordered Exercise  minimal - related to DOE Weight  normal Substance abuse  none   Reviewed recommended immunizations.   Health Maintenance  Topic Date Due   INFLUENZA VACCINE  08/23/2023   Medicare Annual Wellness (AWV)  03/27/2024   DEXA SCAN  01/30/2025   Pneumococcal Vaccine: 50+ Years  Completed   Zoster Vaccines- Shingrix  Completed   Hepatitis B Vaccines  Aged Out   HPV VACCINES  Aged Out   Meningococcal B Vaccine  Aged Out   DTaP/Tdap/Td  Discontinued   COVID-19 Vaccine  Discontinued   Hepatitis C Screening  Discontinued          See Problem List for Assessment and Plan of chronic medical problems.

## 2023-07-21 NOTE — Patient Instructions (Addendum)

## 2023-07-22 ENCOUNTER — Encounter: Payer: Self-pay | Admitting: Internal Medicine

## 2023-07-22 ENCOUNTER — Ambulatory Visit: Payer: Medicare Other | Admitting: Internal Medicine

## 2023-07-22 VITALS — BP 104/80 | HR 74 | Temp 98.0°F | Ht 65.5 in | Wt 131.0 lb

## 2023-07-22 DIAGNOSIS — I2510 Atherosclerotic heart disease of native coronary artery without angina pectoris: Secondary | ICD-10-CM

## 2023-07-22 DIAGNOSIS — D649 Anemia, unspecified: Secondary | ICD-10-CM | POA: Diagnosis not present

## 2023-07-22 DIAGNOSIS — E559 Vitamin D deficiency, unspecified: Secondary | ICD-10-CM | POA: Diagnosis not present

## 2023-07-22 DIAGNOSIS — Z0000 Encounter for general adult medical examination without abnormal findings: Secondary | ICD-10-CM

## 2023-07-22 DIAGNOSIS — L661 Lichen planopilaris, unspecified: Secondary | ICD-10-CM

## 2023-07-22 DIAGNOSIS — E039 Hypothyroidism, unspecified: Secondary | ICD-10-CM

## 2023-07-22 DIAGNOSIS — J849 Interstitial pulmonary disease, unspecified: Secondary | ICD-10-CM

## 2023-07-22 DIAGNOSIS — E782 Mixed hyperlipidemia: Secondary | ICD-10-CM

## 2023-07-22 DIAGNOSIS — G2581 Restless legs syndrome: Secondary | ICD-10-CM

## 2023-07-22 DIAGNOSIS — R7303 Prediabetes: Secondary | ICD-10-CM

## 2023-07-22 LAB — VITAMIN D 25 HYDROXY (VIT D DEFICIENCY, FRACTURES): VITD: 28.46 ng/mL — ABNORMAL LOW (ref 30.00–100.00)

## 2023-07-22 LAB — IBC PANEL
Iron: 68 ug/dL (ref 42–145)
Saturation Ratios: 22.3 % (ref 20.0–50.0)
TIBC: 305.2 ug/dL (ref 250.0–450.0)
Transferrin: 218 mg/dL (ref 212.0–360.0)

## 2023-07-22 LAB — CBC WITH DIFFERENTIAL/PLATELET
Basophils Absolute: 0.1 10*3/uL (ref 0.0–0.1)
Basophils Relative: 1.2 % (ref 0.0–3.0)
Eosinophils Absolute: 0.3 10*3/uL (ref 0.0–0.7)
Eosinophils Relative: 3.9 % (ref 0.0–5.0)
HCT: 38.3 % (ref 36.0–46.0)
Hemoglobin: 12.5 g/dL (ref 12.0–15.0)
Lymphocytes Relative: 22.6 % (ref 12.0–46.0)
Lymphs Abs: 1.9 10*3/uL (ref 0.7–4.0)
MCHC: 32.6 g/dL (ref 30.0–36.0)
MCV: 89.4 fl (ref 78.0–100.0)
Monocytes Absolute: 0.7 10*3/uL (ref 0.1–1.0)
Monocytes Relative: 8.8 % (ref 3.0–12.0)
Neutro Abs: 5.4 10*3/uL (ref 1.4–7.7)
Neutrophils Relative %: 63.5 % (ref 43.0–77.0)
Platelets: 241 10*3/uL (ref 150.0–400.0)
RBC: 4.29 Mil/uL (ref 3.87–5.11)
RDW: 16.6 % — ABNORMAL HIGH (ref 11.5–15.5)
WBC: 8.5 10*3/uL (ref 4.0–10.5)

## 2023-07-22 LAB — COMPREHENSIVE METABOLIC PANEL WITH GFR
ALT: 12 U/L (ref 0–35)
AST: 20 U/L (ref 0–37)
Albumin: 4.2 g/dL (ref 3.5–5.2)
Alkaline Phosphatase: 69 U/L (ref 39–117)
BUN: 15 mg/dL (ref 6–23)
CO2: 23 meq/L (ref 19–32)
Calcium: 9.2 mg/dL (ref 8.4–10.5)
Chloride: 105 meq/L (ref 96–112)
Creatinine, Ser: 0.76 mg/dL (ref 0.40–1.20)
GFR: 73.07 mL/min (ref >60.00)
Glucose, Bld: 89 mg/dL (ref 70–99)
Potassium: 4 meq/L (ref 3.5–5.1)
Sodium: 137 meq/L (ref 135–145)
Total Bilirubin: 0.7 mg/dL (ref 0.2–1.2)
Total Protein: 6.8 g/dL (ref 6.0–8.3)

## 2023-07-22 LAB — HEMOGLOBIN A1C: Hgb A1c MFr Bld: 5.7 % (ref 4.6–6.5)

## 2023-07-22 LAB — LIPID PANEL
Cholesterol: 122 mg/dL (ref 0–200)
HDL: 50 mg/dL (ref >39.00)
LDL Cholesterol: 51 mg/dL (ref 0–99)
NonHDL: 72.41
Total CHOL/HDL Ratio: 2
Triglycerides: 105 mg/dL (ref 0.0–149.0)
VLDL: 21 mg/dL (ref 0.0–40.0)

## 2023-07-22 LAB — T4, FREE: Free T4: 1.35 ng/dL (ref 0.60–1.60)

## 2023-07-22 LAB — TSH: TSH: 0.29 u[IU]/mL — ABNORMAL LOW (ref 0.35–5.50)

## 2023-07-22 LAB — FERRITIN: Ferritin: 110 ng/mL (ref 10.0–291.0)

## 2023-07-22 LAB — VITAMIN B12: Vitamin B-12: >1500 pg/mL — ABNORMAL HIGH (ref 211–911)

## 2023-07-22 NOTE — Assessment & Plan Note (Signed)
 Chronic Lab Results  Component Value Date   LDLCALC 67 08/27/2022   Lipids well controlled Check lipid panel Did not tolerate Crestor Continue pravastatin  40 mg daily Continue regular exercise and healthy diet

## 2023-07-22 NOTE — Assessment & Plan Note (Signed)
 Chronic Management per dermatology On Plaquenil 

## 2023-07-22 NOTE — Assessment & Plan Note (Addendum)
 Chronic Stopped gabapentin , requip  Not currently on any medication Check ferritin, iron panel

## 2023-07-22 NOTE — Assessment & Plan Note (Signed)
 Chronic Mild, nonobstructive Recent cardiac catheterization On aspirin  81 mg daily, pravastatin  40 mg daily Healthy diet, regular exercise

## 2023-07-22 NOTE — Assessment & Plan Note (Addendum)
 Chronic Management per pulmonary Not currently on any medication Has DOE

## 2023-07-22 NOTE — Assessment & Plan Note (Addendum)
 Chronic  management per Dr. Trixie - I will start monitoring On Synthroid  88 mcg daily

## 2023-07-22 NOTE — Assessment & Plan Note (Signed)
 Chronic Lab Results  Component Value Date   HGBA1C 5.8 05/01/2022   Check A1c Low sugar / carb diet Stressed regular exercise

## 2023-07-24 ENCOUNTER — Ambulatory Visit: Payer: Self-pay | Admitting: Internal Medicine

## 2023-07-24 DIAGNOSIS — E039 Hypothyroidism, unspecified: Secondary | ICD-10-CM

## 2023-07-28 ENCOUNTER — Ambulatory Visit: Payer: Self-pay | Admitting: Internal Medicine

## 2023-07-28 NOTE — Telephone Encounter (Signed)
 Sonny Norway M [place] = has NEW Onset pulmonary hyperension . WAs not evident in echo 10 -11 months ago. This nees treatment but she has previouly not tolerated Tyvaso in a clinical trail setting. Please ensure she keeps her August [facility] appt and also research appt for the Tvardi study  Thanks    SIGNATURE    Dr. Dorethia Cave, M.D., F.C.C.P,  Pulmonary and Critical Care Medicine Staff Physician, [facility] Director - Interstitial Lung Disease  Program  Pulmonary Fibrosis [facility] at [facility] [place], [place], [address]   Pager: [phone], If no answer  -> Check AMION or Try [phone] Telephone (clinical office): [phone] Telephone (research): [phone]  9:35 PM 07/28/2023

## 2023-07-30 NOTE — Progress Notes (Signed)
 I called and spoke with the pt and she notified of results/recs per MR. She verbalized understanding. Nothing further needed.

## 2023-08-06 ENCOUNTER — Encounter: Payer: Self-pay | Admitting: *Deleted

## 2023-08-06 NOTE — Progress Notes (Signed)
 "      OFFICE NOTE:    Date:  08/07/2023  ID:  LACINDA CURVIN, DOB 05/19/1940, MRN [id number] PCP: Geofm Glade PARAS, MD   HeartCare Providers Cardiologist:  Redell Shallow, MD        Patient Profile:  Coronary artery disease, nonobstructive  ETT 10/13/14: low risk CCTA 07/21/18: CAC score 80 (52nd percentile); min nonobstructive CAD (LM 0-25, LAD 0-25 LHC 07/18/23: pLAD 25, pLCx 30, mLCx 20, pRCA 10 Palpitations Monitor 10/2022: Sinus bradycardia, normal sinus rhythm, sinus tachycardia, occasional PAC, short runs of SVT (longest 15 beats), occasional PVC and rare couplet. ILD (Interstitial Lung Disease)  TTE 09/18/22: EF 55-60, no RWMA, Gr 1 DD, NL RVSF, mild LAE, trivial MR, trivial AI, AV sclerosis, RAP 3  TTE 07/02/23: EF 60-65, no RWMA, mild pulm HTN, RVSP 41.7, small effusion, AV sclerosis, RAP 3  Pulmonary hypertension  RHC 07/18/23: PCWP 8, LVEDP 8; PA 46/16, mean PA 26, PVR 4 WU, CO 4.5, CI 2.7 Hyperlipidemia  MVP   Aortic atherosclerosis Hyperlipidemia  Peripheral arterial disease  GERD       Discussed the use of AI scribe software for clinical note transcription with the patient, who gave verbal consent to proceed. History of Present Illness Elizabeth Espinoza is a 83 y.o. female who returns for post cath follow up. She was seen 07/16/23 with chest pain concerning for USA . R and L cardiac catheterization was arranged and demonstrated mild nonobstructive CAD, normal R and L heart filling pressures, mild to mod pulmonary HTN (mean PA 26 mmHg).   She was on a clinical trial for ILD but had to quit due to worsening symptoms. She experienced chest pain and pressure, which has improved since stopping the trial medication. The chest discomfort is described as random, occurring mostly at rest, and sometimes associated with shortness of breath. Oxygen  levels drop into the 80s during exertion, such as walking up stairs, and she uses supplemental oxygen  but finds it cumbersome.    ROS-See HPI    Studies Reviewed:       Results LABS LDL: 51 (06/2023)           Physical Exam:  VS:  BP 115/69   Pulse 76   Ht 5' 5.5 (1.664 m)   Wt 132 lb 12.8 oz (60.2 kg)   SpO2 95%   BMI 21.76 kg/m        Wt Readings from Last 3 Encounters:  08/07/23 132 lb 12.8 oz (60.2 kg)  07/22/23 131 lb (59.4 kg)  07/18/23 131 lb (59.4 kg)    Constitutional:      Appearance: Healthy appearance. Not in distress.  Neck:     Vascular: JVD normal.  Pulmonary:     Breath sounds: Normal breath sounds. No wheezing. No rales.  Cardiovascular:     Normal rate. Regular rhythm.     Murmurs: There is no murmur.     No rub.  Edema:    Peripheral edema absent.  Abdominal:     Palpations: Abdomen is soft.       Assessment and Plan:    Assessment & Plan Coronary artery disease involving native coronary artery of native heart with angina pectoris [facility]) Recent cardiac catheterization with mild nonobstructive CAD.  She is still having some chest discomfort.  Overall this is improved.  She questions whether or not it was related to the study drug she was on for interstitial lung disease.  CFR, acetylcholine administration was not performed during  cardiac catheterization.  I do not suspect coronary microvascular disease.  She could not tolerate beta-blocker in the past.  We could consider calcium channel blocker or ranolazine in the future to see if this helps her symptoms.  For now, I would focus on management of interstitial lung disease.  Recent LDL optimal.  Continue current medical regimen.  Follow-up 02/2024 for annual visit. Pulmonary hypertension, unspecified (HCC) ILD (interstitial lung disease) (HCC) Mild to moderate pulmonary hypertension confirmed by right heart catheterization with mean PA pressure of 26 mmHg.  Suspect pulmonary hypertension is related to interstitial lung disease. Follow up with Dr. Geronimo as planned.      Dispo:  Return in about 7 months (around  03/09/2024) for Routine Follow Up with Dr. Pietro.  Signed, Glendia Ferrier, PA-C   "

## 2023-08-07 ENCOUNTER — Encounter: Payer: Self-pay | Admitting: Physician Assistant

## 2023-08-07 ENCOUNTER — Ambulatory Visit: Attending: Physician Assistant | Admitting: Physician Assistant

## 2023-08-07 VITALS — BP 115/69 | HR 76 | Ht 65.5 in | Wt 132.8 lb

## 2023-08-07 DIAGNOSIS — I272 Pulmonary hypertension, unspecified: Secondary | ICD-10-CM

## 2023-08-07 DIAGNOSIS — J849 Interstitial pulmonary disease, unspecified: Secondary | ICD-10-CM | POA: Diagnosis not present

## 2023-08-07 DIAGNOSIS — I25119 Atherosclerotic heart disease of native coronary artery with unspecified angina pectoris: Secondary | ICD-10-CM | POA: Diagnosis not present

## 2023-08-07 NOTE — Assessment & Plan Note (Signed)
 Mild to moderate pulmonary hypertension confirmed by right heart catheterization with mean PA pressure of 26 mmHg.  Suspect pulmonary hypertension is related to interstitial lung disease. Follow up with Dr. Geronimo as planned.

## 2023-08-07 NOTE — Patient Instructions (Signed)
 Medication Instructions:  Your physician recommends that you continue on your current medications as directed. Please refer to the Current Medication list given to you today.  *If you need a refill on your cardiac medications before your next appointment, please call your pharmacy*  Lab Work: None ordered  If you have labs (blood work) drawn today and your tests are completely normal, you will receive your results only by: MyChart Message (if you have MyChart) OR A paper copy in the mail If you have any lab test that is abnormal or we need to change your treatment, we will call you to review the results.  Testing/Procedures: None ordered  Follow-Up: At [facility], you and your health needs are our priority.  As part of our continuing mission to provide you with exceptional heart care, our providers are all part of one team.  This team includes your primary Cardiologist (physician) and Advanced Practice Providers or APPs (Physician Assistants and Nurse Practitioners) who all work together to provide you with the care you need, when you need it.  Your next appointment:   6 month(s)  Provider:   Redell Shallow, MD    We recommend signing up for the patient portal called MyChart.  Sign up information is provided on this After Visit Summary.  MyChart is used to connect with patients for Virtual Visits (Telemedicine).  Patients are able to view lab/test results, encounter notes, upcoming appointments, etc.  Non-urgent messages can be sent to your provider as well.   To learn more about what you can do with MyChart, go to [email or web address].   Other Instructions

## 2023-08-08 ENCOUNTER — Encounter: Admitting: Internal Medicine

## 2023-08-08 ENCOUNTER — Encounter: Admitting: Primary Care

## 2023-08-08 DIAGNOSIS — J849 Interstitial pulmonary disease, unspecified: Secondary | ICD-10-CM

## 2023-08-08 DIAGNOSIS — Z006 Encounter for examination for normal comparison and control in clinical research program: Secondary | ICD-10-CM

## 2023-08-08 NOTE — Research (Signed)
 Title: A Phase 2 Multicenter, Randomized, Double-blind, Placebo Controlled Study to Evaluate the Safety, Tolerability, Pharmacokinetics, and Efficacy of TTI-101 in Participants with Idiopathic Pulmonary Fibrosis    Dose and Duration of Treatment: 75 participants will be enrolled using a 1:1:1 randomization ratio over a 20 week period (4 week screening period, 12 week treatment period and a 4 week follow-up period).   25 participants: TTI-101 400 mg/day   25 participants: TTI-101 800 mg/day   25 participants: placebo   Protocol # TVD-101-003P    Sponsor: [employer], [address], [address] [place], [place] [address] [place]   Mechanism of action: TTI-101 is a small-molecule inhibitor of STAT3 (signal transducer and activator of transcription 3). STAT3 plays a major role in the cellular processes involved in fibrosis including fibroblast proliferation. TTI-101 binds tightly to STAT3 which prevents STAT3 recruitment to signaling complexes that contain activated tyrosine kinases, thereby preventing STAT3 phosphorylation on Y705 and STAT3 activation. Binding of TTI-101 to STAT3 also prevents STAT3 dimerization. As such, TTI-101 is expected to prevent or reverse progression of fibrosis in IPF patients.  Key Inclusion Criteria: Age >= 40 years  Diagnosed with IPF based on the 2018 or 2022 ATS/ERS/JRS/ALAT guidelines within 5 years  Chest HRCT performed within 12 months meeting requirements for IPF Extent of fibrosis > emphysematous changes on the HRCT Meeting all the following during screening confirmed by central review,   >40% of predicted FVC and a (FEV1)/FVC >=0.7  A predicted DLCO (Hb corrected) >25%  SpO2 >= 88% with up to 4L O2/min by pulse oximetry at rest  If currently receiving nintedanib, stable dose for >3 months  Key Exclusion Criteria: Known to have the following diseases at screening: Uncontrolled pulmonary hypertension, or pulmonary hypertension requiring  chronic medical treatment Congestive heart failure - NYHA Class III or IV   Drug-induced interstitial pneumonia, pneumoconiosis, hypersensitivity pneumonitis, or radiation pneumonitis   Collagen vascular disease with involvement of the lung, autoimmune disease with involvement of the lung, sarcoidosis, granulomatous disease Severe hypertension (BP >= 160/100 despite maximal therapy within 3 months of visit 1) Myocardial infarction, unstable cardiac angina, or history of thrombotic event within 6 months of screening QTcF >450 msec for men and >470 msec for women at screening.   History of significant/symptomatic bradycardia long QT syndrome or  Uncorrected hypomagnesemia or hypokalemia or Heart rate <50 bpm at rest   Unresolved respiratory tract infection within 4 weeks or an acute exacerbation of IPF within 3 months  Active cancer or a history of cancer with a significant risk of recurrence during the study.  eGFR <=64mL/min/1.73 m2, Hb<=10g/dL, TAR<6999/ff6 , platelets <100,000/mm3 , serum total bilirubin >1.5 x ULN, liver enzymes >= 2 x ULN Receiving steroids > 10 mg/day of prednisolone or its equivalent within 2 weeks   Immunosuppressive agents within 4 weeks  Received pirfenidone  within 3 months, smoking or vaping within 3 months   Pharmacokinetics: (ADME) - Contraindications: The SEDD formulation for TTI-101 provided greater systemic exposures relative to the Labrasol/PEG formulation. The SEDD formulation decreased the pill burden while increasing systemic exposures. The half-life of the drug in participants generally ranged from 4-8 h. The half-life data are supportive of the BID dose schedule. The administration of TTI-101 is limited to investigational use only and limited to the oral route of administration. TTI-101 is contraindicated for use in participants with known hypersensitivity to any of the excipients. Special Warnings/Considerations: Insufficient experience exists with TTI-101  to provide comprehensive warning guidance beyond what is  standard for any investigational drug.  Interactions:  Studies indicated the potential for strong induction of CYP1A2 by TTI-101. As expected, TTI-101 did induce metabolism of pirfenidone , known to be primarily metabolized by CYP1A2. There was no interaction shown between TTI-101 and nintedanib. Drug Interaction Studies: [facility] conducted a DDI study in normal healthy volunteers given TTI-101 and either pirfenidone  or nintedanib. Serious Adverse Reactions Observed in Clinical studies: 32 participants out of 105 reported SAE. (30.5%). However all SAEs reported were from study 2016-0842 (participants with solid tumors). No SAEs were reported in the DDI study (Healthy volunteer). Majority of SAE were GI disorders.  Serious Adverse Reactions Observed Postmarketing: To date, TTI-101 has not been registered for use or marketed in any jurisdiction. Safety Data:  The safety data from study 2016-0842 demonstrates the most frequent AEs are GI-related ( nausea, vomiting, change in appetite, diarrhea, abdominal pain) and related to liver function (elevated AST and ALT).  Fewer AEs were reported with the SDD formulation. EKG - PR prolongation was seen in 1 out of 105 participants. (1%)    Overall Adverse events in total participants N=105 (%) Severity  Grade1-mild Grade2-Moderate Grade3-Severe Formulation 1  N=15 Formulation 2    N=47 Formulation 3 (SDD)  N=48*  Diarrhea 30 (28.6%) 24 (22.9%) 7 (6.7%) 8 (7.6%) 6 (40.0%) 21 (44.7%) 3 (6.3%)  Nausea 14 (13.3%) 9 (8.6%) 4 (3.8%) 1 (1.0%) 4 (26.7%) 7 (14.9%) 3 (6.3%)  Vomiting 7 (6.7%) 5 (4.8%) 2 (1.9%) - 3 (20.0%) 2 (4.3%) 2 (4.2%)   Abdominal pain 3 (2.9%) 2 (1.9%) 1 (1.0%) - 3 (20.0%) - -  Elevated ALT 7 (6.7%)   4 (3.8%)   3 (2.9%)   3 (2.9%) 2 (13.3%)  4 (8.5%)   1 (2.1%)   Elevated AST 6 (5.7%)   2 (1.9%)   4 (3.8%)   3 (2.9%) 1 (6.7%)  4 (8.5%)  1 (2.1%)   Headache 5 (4.8%) 5  (4.8%) - - - - 5 (10.4%)    TEAE from DDI study alone with Healthy Volunteer N=41 Part 1 nintedanib Part 2 Pirfenidone   Diarrhea  2 (4.9%)     1 (4.8%)  1 (5.0%)  Nausea 2 (4.9%)   1 (4.8%)   1 (5.0%)   Vomiting 1 (2.4%)     -  1 (5.0%)   Abdominal distension 1 (2.4%)     1 (4.8%)  -  Headache 5 (12.2%)   4 (19.0%)  1 (5.0%)    Stability:  The recommended storage condition for TTI-101 tablet, 200 mg and matching placebo is room temperature (20C to 25C or 28F to 51F). Excursions between 15C and 30C (28F and 51F) are allowed.   [facility] Clinical Research Coordinator Note: TVD-101-003P subject [id number], Elizabeth Espinoza (DOB: 28-Oct-1940), completed their combined Visit 5/6 on 17-Jul-/2025. This is the final visit in the trial for subject [id number]. All clinical trial procedures were completed per protocol. Almarie Ferrari, NP, conducted the physical examination and vitals review. There were no new adverse events reported by the subject. The subject was advised to reach out to the site and/or the Principal Investigator, Dr. Geronimo, with any questions or concerns moving forward. The subject was thanked for their participation in research and contribution to science, and confirmed they had no questions at this time.   Signed Almarie Cress, CCRC

## 2023-08-08 NOTE — Progress Notes (Signed)
 Title: A Phase 2 Multicenter, Randomized, Double-blind, Placebo Controlled Study to Evaluate the Safety, Tolerability, Pharmacokinetics, and Efficacy of TTI-101 in Participants with Idiopathic Pulmonary Fibrosis    Dose and Duration of Treatment: 75 participants will be enrolled using a 1:1:1 randomization ratio over a 20 week period (4 week screening period, 12 week treatment period and a 4 week follow-up period).   25 participants: TTI-101 400 mg/day   25 participants: TTI-101 800 mg/day   25 participants: placebo   Protocol # TVD-101-003P    Sponsor: [employer], [address], [address] [place], [place] [address] [place]   Mechanism of action: TTI-101 is a small-molecule inhibitor of STAT3 (signal transducer and activator of transcription 3). STAT3 plays a major role in the cellular processes involved in fibrosis including fibroblast proliferation. TTI-101 binds tightly to STAT3 which prevents STAT3 recruitment to signaling complexes that contain activated tyrosine kinases, thereby preventing STAT3 phosphorylation on Y705 and STAT3 activation. Binding of TTI-101 to STAT3 also prevents STAT3 dimerization. As such, TTI-101 is expected to prevent or reverse progression of fibrosis in IPF patients.  Key Inclusion Criteria: Age >= 40 years  Diagnosed with IPF based on the 2018 or 2022 ATS/ERS/JRS/ALAT guidelines within 5 years  Chest HRCT performed within 12 months meeting requirements for IPF Extent of fibrosis > emphysematous changes on the HRCT Meeting all the following during screening confirmed by central review,   >40% of predicted FVC and a (FEV1)/FVC >=0.7  A predicted DLCO (Hb corrected) >25%  SpO2 >= 88% with up to 4L O2/min by pulse oximetry at rest  If currently receiving nintedanib, stable dose for >3 months  Key Exclusion Criteria: Known to have the following diseases at screening: Uncontrolled pulmonary hypertension, or pulmonary hypertension requiring  chronic medical treatment Congestive heart failure - NYHA Class III or IV   Drug-induced interstitial pneumonia, pneumoconiosis, hypersensitivity pneumonitis, or radiation pneumonitis   Collagen vascular disease with involvement of the lung, autoimmune disease with involvement of the lung, sarcoidosis, granulomatous disease Severe hypertension (BP >= 160/100 despite maximal therapy within 3 months of visit 1) Myocardial infarction, unstable cardiac angina, or history of thrombotic event within 6 months of screening QTcF >450 msec for men and >470 msec for women at screening.   History of significant/symptomatic bradycardia long QT syndrome or  Uncorrected hypomagnesemia or hypokalemia or Heart rate <50 bpm at rest   Unresolved respiratory tract infection within 4 weeks or an acute exacerbation of IPF within 3 months  Active cancer or a history of cancer with a significant risk of recurrence during the study.  eGFR <=24mL/min/1.73 m2, Hb<=10g/dL, TAR<6999/ff6 , platelets <100,000/mm3 , serum total bilirubin >1.5 x ULN, liver enzymes >= 2 x ULN Receiving steroids > 10 mg/day of prednisolone or its equivalent within 2 weeks   Immunosuppressive agents within 4 weeks  Received pirfenidone  within 3 months, smoking or vaping within 3 months   Pharmacokinetics: (ADME) - Contraindications: The SEDD formulation for TTI-101 provided greater systemic exposures relative to the Labrasol/PEG formulation. The SEDD formulation decreased the pill burden while increasing systemic exposures. The half-life of the drug in participants generally ranged from 4-8 h. The half-life data are supportive of the BID dose schedule. The administration of TTI-101 is limited to investigational use only and limited to the oral route of administration. TTI-101 is contraindicated for use in participants with known hypersensitivity to any of the excipients. Special Warnings/Considerations: Insufficient experience exists with TTI-101  to provide comprehensive warning guidance beyond what is  standard for any investigational drug.  Interactions:  Studies indicated the potential for strong induction of CYP1A2 by TTI-101. As expected, TTI-101 did induce metabolism of pirfenidone , known to be primarily metabolized by CYP1A2. There was no interaction shown between TTI-101 and nintedanib. Drug Interaction Studies: [facility] conducted a DDI study in normal healthy volunteers given TTI-101 and either pirfenidone  or nintedanib. Serious Adverse Reactions Observed in Clinical studies: 32 participants out of 105 reported SAE. (30.5%). However all SAEs reported were from study 2016-0842 (participants with solid tumors). No SAEs were reported in the DDI study (Healthy volunteer). Majority of SAE were GI disorders.  Serious Adverse Reactions Observed Postmarketing: To date, TTI-101 has not been registered for use or marketed in any jurisdiction. Safety Data:  The safety data from study 2016-0842 demonstrates the most frequent AEs are GI-related ( nausea, vomiting, change in appetite, diarrhea, abdominal pain) and related to liver function (elevated AST and ALT).  Fewer AEs were reported with the SDD formulation. EKG - PR prolongation was seen in 1 out of 105 participants. (1%)    Overall Adverse events in total participants N=105 (%) Severity  Grade1-mild Grade2-Moderate Grade3-Severe Formulation 1  N=15 Formulation 2    N=47 Formulation 3 (SDD)  N=48*  Diarrhea 30 (28.6%) 24 (22.9%) 7 (6.7%) 8 (7.6%) 6 (40.0%) 21 (44.7%) 3 (6.3%)  Nausea 14 (13.3%) 9 (8.6%) 4 (3.8%) 1 (1.0%) 4 (26.7%) 7 (14.9%) 3 (6.3%)  Vomiting 7 (6.7%) 5 (4.8%) 2 (1.9%) - 3 (20.0%) 2 (4.3%) 2 (4.2%)   Abdominal pain 3 (2.9%) 2 (1.9%) 1 (1.0%) - 3 (20.0%) - -  Elevated ALT 7 (6.7%)   4 (3.8%)   3 (2.9%)   3 (2.9%) 2 (13.3%)  4 (8.5%)   1 (2.1%)   Elevated AST 6 (5.7%)   2 (1.9%)   4 (3.8%)   3 (2.9%) 1 (6.7%)  4 (8.5%)  1 (2.1%)   Headache 5 (4.8%) 5  (4.8%) - - - - 5 (10.4%)    TEAE from DDI study alone with Healthy Volunteer N=41 Part 1 nintedanib Part 2 Pirfenidone   Diarrhea  2 (4.9%)     1 (4.8%)  1 (5.0%)  Nausea 2 (4.9%)   1 (4.8%)   1 (5.0%)   Vomiting 1 (2.4%)     -  1 (5.0%)   Abdominal distension 1 (2.4%)     1 (4.8%)  -  Headache 5 (12.2%)   4 (19.0%)  1 (5.0%)    Stability:  The recommended storage condition for TTI-101 tablet, 200 mg and matching placebo is room temperature (20C to 25C or 62F to 19F). Excursions between 15C and 30C (64F and 21F) are allowed.   This visit for Subject BLAYKE PINERA with DOB: 07/05/1940 on 07/12/2023 for the above protocol is Visit/Encounter # [id number]  and is for purpose of Resarh . Subject/LAR expressed continued interest and consent in continuing as a study subject. Subject thanked for participation in research and contribution to science.      S: Last visit with Tavardi study, she is no longer taking medication due to AEs.  She has a new finding of pericardial effusion on the echocardiogram but otherwise the echo was fine.  The echocardiogram shows very small pericardial effusion but by report it is a new finding.  She had T wave inversions associated with the current illness but today on EKG the T wave inversions have resolved. She had a right heart cath that showed new onset pulmonary hypertension which was  not evident on echo 10-11 months ago. This needs treatment but she was previously not tolerant to Tyvaso in clinical trial settings. She has a follow-up apt with Dr. Geronimo to discuss alternative options. Her respiratory status remains stable.  Her diarrhea is resolved. Exam done and positive for distant crackles at lung bases and the scattered bruises on the bilateral forearms.

## 2023-08-13 ENCOUNTER — Telehealth: Payer: Self-pay

## 2023-08-13 ENCOUNTER — Telehealth: Payer: Self-pay | Admitting: Internal Medicine

## 2023-08-13 DIAGNOSIS — J84112 Idiopathic pulmonary fibrosis: Secondary | ICD-10-CM

## 2023-08-13 DIAGNOSIS — J849 Interstitial pulmonary disease, unspecified: Secondary | ICD-10-CM

## 2023-08-13 NOTE — Telephone Encounter (Signed)
 Submitted an URGENT Prior Authorization request to [facility] for OFEV via CoverMyMeds. Will update once we receive a response.  Key: [id number]   Attempted to enroll patient into PAF grant but they are requiring proof of income and SSN by 09/12/2023. Will await PA approval and test claim before contacting patient regarding these document needed  Sherry Pennant, PharmD, MPH, BCPS, CPP Clinical Pharmacist (Rheumatology and Pulmonology)

## 2023-08-13 NOTE — Telephone Encounter (Signed)
 Dr. Reeves phone note:     Elizabeth Espinoza Sprinkle  -she called the research office expressing concerns about desaturation at exercise and also rib pain.  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  2 years ago she was on inhaled treprostinil versus placebo study for standard of care IPF but did not tolerated and had classic side effects of treprostinil.  She has also tried Esbriet  and has not tolerated.  She has always been fearful about trying nintedanib   I told her was very concerned about progressive IPF but also development of pulmonary hypertension now which is a negative prognostic sign.  Control is also concerned about the fact that the only approved therapy for WHO 3 pulmonary hypertension which is inhaled treprostinil she did not tolerate 2 years ago.     She is saying that the family was concerned about her health and was wondering about her [facility] referral     Plan   -  I advised her that I was open to the idea of referral to [facility] but she is going to reflect on it   - Meanwhile I strongly encouraged her to try nintedanib..  New drug Nerandomilast will be approved for another several months.  I advised that with progressive disease Internet right now would be the only antifibrotic that is approved that she has not tried.  And without antifibrotic's disease is likely to get worse faster.  Therefore based on shared decision making she is agreed to try nintedanib 100 mg twice daily.  Please start paperwork for this ASAP - this week itself ahead of next OV 4 weeks from now   For the pulmonary hypertension, I will look at enrolling her in a clinical trial-for which Dr. Annella is the PI

## 2023-08-13 NOTE — Telephone Encounter (Signed)
 Pharmacy team will start Ofev benefits investigation

## 2023-08-13 NOTE — Telephone Encounter (Addendum)
    /  Elizabeth Espinoza/Elizabeth Espinoza   [name]  -she called the research office expressing concerns about desaturation at exercise and also rib pain.  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  2 years ago she was on inhaled treprostinil versus placebo study for standard of care IPF but did not tolerated and had classic side effects of treprostinil.  She has also tried Esbriet  and has not tolerated.  She has always been fearful about trying nintedanib  I told her was very concerned about progressive IPF but also development of pulmonary hypertension now which is a negative prognostic sign.  Control is also concerned about the fact that the only approved therapy for WHO 3 pulmonary hypertension which is inhaled treprostinil she did not tolerate 2 years ago.   She is saying that the family was concerned about her health and was wondering about her [facility] referral   Plan  -  I advised her that I was open to the idea of referral to [facility] but she is going to reflect on it  - Meanwhile I strongly encouraged her to try nintedanib..  New drug Nerandomilast will be approved for another several months.  I advised that with progressive disease Internet right now would be the only antifibrotic that is approved that she has not tried.  And without antifibrotic's disease is likely to get worse faster.  Therefore based on shared decision making she is agreed to try nintedanib 100 mg twice daily.  Please start paperwork for this ASAP - this week itself ahead of next OV 4 weeks from now  For the pulmonary hypertension SHE DOES NOT qualify for phocus clinical trial because of age > 65 -> So consider YUTREPIA MDI start  LEt us  get paper work started this week    SIGNATURE    Dr. Dorethia Cave, M.D., F.C.C.P,  Pulmonary and Critical Care Medicine Staff Physician, [facility] Center Director - Interstitial Lung Disease  Program  Pulmonary Fibrosis [facility] at [facility] [place], [place], [address]   Pager: [phone], If no answer  -> Check AMION or Try [phone] Telephone (clinical office): [phone] Telephone (research): [phone]  10:01 AM 08/13/2023

## 2023-08-15 ENCOUNTER — Telehealth: Payer: Self-pay

## 2023-08-15 NOTE — Telephone Encounter (Signed)
 Copied from CRM #[id number]. Topic: Clinical - Medication Question >> Aug 13, 2023  2:22 PM Chantha C wrote: Reason for CRM: Annabella DEL from [place] [phone] states Ofev was approved and needs know which pharmacy will the medication go to, specialty or regular pharmacy? Tiffany asked for the office to contact the patient with the information. Please advise and call back.   ----------------------------------------------------------------------- From previous Reason for Contact - Medical Advice: Reason for CRM:

## 2023-08-16 ENCOUNTER — Other Ambulatory Visit (HOSPITAL_COMMUNITY): Payer: Self-pay

## 2023-08-16 MED ORDER — OFEV 100 MG PO CAPS: 100.0000 mg | ORAL_CAPSULE | Freq: Two times a day (BID) | ORAL | 1 refills | Status: DC

## 2023-08-16 NOTE — Telephone Encounter (Signed)
 Received notification from [facility] regarding a prior authorization for OFEV. Authorization has been APPROVED from 08/13/2023 to 08/12/2024. Approval letter has not yet been obtained, but will be sent to scan center if/when it has been received.  Per test claim, copay for 30 days supply is $1,387.16  Patient must fill through a pharmacy able to dispense Ofev (does not appear to be locked in to any specific pharmacy).  Authorization #  [id number]   Will route to [facility] for clinical outreach and to have Rx sent in.

## 2023-08-16 NOTE — Telephone Encounter (Signed)
 Spoke with patient - she does not qualify for pulmonary fibrosis grant based on their household income being over income threshold for grant. She will not be able to move forward with grant. She will also definitely be ineligible for patient assistance program through [facility]  She states she spoke with representative with insurance who told her copay would be $300. Advised that this is not what I am actively pulling through with her insurance  Nevertheless, she is aware of the $2000 OOP max for the year. I have sent the rx to [facility] (pulmonary fibrosis team): [phone]. She has been advised to call pharmacy. If affordable, she can pay it. If significantly more expensive, I did recommend enrollment into Medicare Rx Payment Plan to help divide copay especially if she is unable to tolerate Ofev (she would have paid OOP). Also advised that she can always opt out of payment Plan in the case she discontinues Ofev.  She will follow-up with pharmacy next week and determine next steps about copay from there.  Sherry Pennant, PharmD, MPH, BCPS, CPP Clinical Pharmacist (Rheumatology and Pulmonology)

## 2023-08-22 ENCOUNTER — Other Ambulatory Visit: Payer: Self-pay

## 2023-08-22 ENCOUNTER — Telehealth: Payer: Self-pay

## 2023-08-22 ENCOUNTER — Encounter: Payer: Self-pay | Admitting: Internal Medicine

## 2023-08-22 DIAGNOSIS — E039 Hypothyroidism, unspecified: Secondary | ICD-10-CM

## 2023-08-22 NOTE — Telephone Encounter (Signed)
 Copied from CRM [id number]. Topic: Clinical - Prescription Issue >> Aug 21, 2023  4:13 PM [name] B wrote: Reason for CRM: Patient calling to talk to Dr Reeves nurse.  Dr Geronimo order Ofed and the insurance approved it, however, [facility] states [facility] cost is $1,387.16.  What options are available for patient.  Please call patient.

## 2023-08-23 ENCOUNTER — Other Ambulatory Visit (HOSPITAL_COMMUNITY): Payer: Self-pay

## 2023-08-26 MED ORDER — SYNTHROID 88 MCG PO TABS: ORAL_TABLET | ORAL | 3 refills | Status: DC

## 2023-08-26 NOTE — Telephone Encounter (Signed)
 ATC patient to discuss. Patient ineligible for [facility] (which has highest income threshold 500% [employer]PL)  Sherry Pennant, PharmD, MPH, BCPS, CPP Clinical Pharmacist (Rheumatology and Pulmonology)  Me    08/16/23 10:34 AM Note Spoke with patient - she does not qualify for pulmonary fibrosis grant based on their household income being over income threshold for grant. She will not be able to move forward with grant. She will also definitely be ineligible for patient assistance program through [facility]   She states she spoke with representative with insurance who told her copay would be $300. Advised that this is not what I am actively pulling through with her insurance   Nevertheless, she is aware of the $2000 OOP max for the year. I have sent the rx to [facility] (pulmonary fibrosis team): [phone]. She has been advised to call pharmacy. If affordable, she can pay it. If significantly more expensive, I did recommend enrollment into Medicare Rx Payment Plan to help divide copay especially if she is unable to tolerate Ofev  (she would have paid OOP). Also advised that she can always opt out of payment Plan in the case she discontinues Ofev .   She will follow-up with pharmacy next week and determine next steps about copay from there.   Sherry Pennant, PharmD, MPH, BCPS, CPP Clinical Pharmacist (Rheumatology and Pulmonology)

## 2023-08-26 NOTE — Telephone Encounter (Signed)
 Copied from CRM #[id number]. Topic: Clinical - Prescription Issue >> Aug 23, 2023  4:15 PM [name] B wrote: Mary @ [facility], they have been unsuccessful at reaching patient regarding her Ofed. (The cost is [phone]) If/when we talk to patient.let her know there are some options:  [facility] program @ [phone], or also [facility] @ [phone]..   Questions please call [phone]

## 2023-08-26 NOTE — Telephone Encounter (Signed)
 Patient reached back and I discussed the Medicare Rx Payment Plan in detail.  She states that she has paid $600 OOP for prescriptions this year. Provided estimate for monthly copays through MPPP  Advised that there is no grant or copay assistance program that she is eligible for based on household income.  She will call her [facility] Medicare insurance and they f/u up with pharmacy.  Sherry Pennant, PharmD, MPH, BCPS, CPP Clinical Pharmacist (Rheumatology and Pulmonology)

## 2023-08-26 NOTE — Telephone Encounter (Signed)
 I see you ordered TSH lab, do you want pt to come in for this bloodwork before rx is sent to check dose?

## 2023-08-29 ENCOUNTER — Telehealth: Payer: Self-pay | Admitting: Internal Medicine

## 2023-08-29 NOTE — Telephone Encounter (Signed)
 Patient does not qualify for patient assistance or grant. I have extensively discussed this with patient on 08/16/23 and 08/26/2023. She had agreed to [facility] Prescription Payment Plan.  Sample left up front for patient as documented by Aleck Puls, PharmD  Sherry Pennant, PharmD, MPH, BCPS, CPP Clinical Pharmacist (Rheumatology and Pulmonology)

## 2023-08-29 NOTE — Telephone Encounter (Signed)
 Contacted patient, verified name and DOB.   Pharmacy team has sample of Ofev  available for 30 days. Patient aware she can pick up sample at front desk. Advised clinic is closed Friday afternoons.   Medication Samples will be provided to the patient.  Drug name: Ofev  (nintedanib)   Strength: 100mg     Qty: 60 LOT: [id number]  Exp.Date: 2027-MAR-31  Dosing instructions: Take 1 capsule by mouth twice daily (12 hours apart) with food   Elizabeth Espinoza Puls 4:28 PM 08/29/2023  She is interested in Medicare payment plan to divide max OOP cost $2000 over monthly payment plan. Advised patient to contact insurance company if she is interested in opting in to payment plan.   Pt verbalizes understanding and agreement with plan.   Elizabeth Puls, PharmD, BCPS Clinical Pharmacist - Pulmonology

## 2023-08-29 NOTE — Telephone Encounter (Signed)
    Elizabeth Headen Compton0 called me 3:29 PM 08/29/2023  - says ofev  expensive co pay $1K   Plan  - see if uyou can give donor samples for a month 100mg  twice daily  - if ony 150mg  available, ask her to take BID MWF and once daily TTS with Sunday holiday

## 2023-09-11 ENCOUNTER — Other Ambulatory Visit (INDEPENDENT_AMBULATORY_CARE_PROVIDER_SITE_OTHER)

## 2023-09-11 ENCOUNTER — Ambulatory Visit: Payer: Self-pay | Admitting: Internal Medicine

## 2023-09-11 DIAGNOSIS — R5383 Other fatigue: Secondary | ICD-10-CM | POA: Diagnosis not present

## 2023-09-11 DIAGNOSIS — T148XXA Other injury of unspecified body region, initial encounter: Secondary | ICD-10-CM | POA: Diagnosis not present

## 2023-09-11 DIAGNOSIS — E039 Hypothyroidism, unspecified: Secondary | ICD-10-CM

## 2023-09-11 DIAGNOSIS — E559 Vitamin D deficiency, unspecified: Secondary | ICD-10-CM

## 2023-09-11 DIAGNOSIS — R110 Nausea: Secondary | ICD-10-CM | POA: Diagnosis not present

## 2023-09-11 DIAGNOSIS — J849 Interstitial pulmonary disease, unspecified: Secondary | ICD-10-CM | POA: Diagnosis not present

## 2023-09-11 DIAGNOSIS — R197 Diarrhea, unspecified: Secondary | ICD-10-CM

## 2023-09-11 DIAGNOSIS — R0609 Other forms of dyspnea: Secondary | ICD-10-CM

## 2023-09-11 LAB — SEDIMENTATION RATE: Sed Rate: 3 mm/h (ref 0–30)

## 2023-09-11 LAB — TSH: TSH: 0.57 u[IU]/mL (ref 0.35–5.50)

## 2023-09-13 NOTE — Telephone Encounter (Signed)
**Note De-identified  [place] Obfuscation** Please advise 

## 2023-09-14 MED ORDER — SYNTHROID 88 MCG PO TABS: ORAL_TABLET | ORAL | Status: AC

## 2023-09-16 ENCOUNTER — Ambulatory Visit (INDEPENDENT_AMBULATORY_CARE_PROVIDER_SITE_OTHER): Admitting: Internal Medicine

## 2023-09-16 ENCOUNTER — Encounter: Payer: Self-pay | Admitting: Internal Medicine

## 2023-09-16 ENCOUNTER — Other Ambulatory Visit (INDEPENDENT_AMBULATORY_CARE_PROVIDER_SITE_OTHER)

## 2023-09-16 VITALS — BP 118/62 | HR 80 | Ht 65.0 in | Wt 131.6 lb

## 2023-09-16 DIAGNOSIS — J84112 Idiopathic pulmonary fibrosis: Secondary | ICD-10-CM

## 2023-09-16 DIAGNOSIS — Z5181 Encounter for therapeutic drug level monitoring: Secondary | ICD-10-CM | POA: Diagnosis not present

## 2023-09-16 LAB — HEPATIC FUNCTION PANEL
ALT: 12 U/L (ref 0–35)
AST: 20 U/L (ref 0–37)
Albumin: 4.1 g/dL (ref 3.5–5.2)
Alkaline Phosphatase: 71 U/L (ref 39–117)
Bilirubin, Direct: 0.1 mg/dL (ref 0.0–0.3)
Total Bilirubin: 0.5 mg/dL (ref 0.2–1.2)
Total Protein: 6.7 g/dL (ref 6.0–8.3)

## 2023-09-16 NOTE — Progress Notes (Signed)
 IOV 12/04/2017: Dr Shelah: Ms. Bloomfield is a 83 year old never smoker with a history of allergic rhinitis, GERD, hypothyroidism, MVP.  She is been seen in our office in the past by Dr. Corrie for restless leg syndrome-she still treated for this with Requip . On allegra, nexium  prn.   She is referred today for dyspnea.  She reports that she has had an exercise routine where she walked w her husband 2 miles, exercises at the [facility], for several years. She has noticed more SOB, especially with mild hills. Sometimes now has to stop briefly to complete the walk. Occasional chest tightness, no wheeze. No overt CP. She reports daily cough, often in the am, sometimes clear mucous. She is on allegra. She had a walking stress test 09/2014 > reassuring ECG w exercise. Weight has been stable. Her last TSH was 06/25/17, 0.73.   Review of her notes shows hx possible GGI on prior CT 2014.   ROV 02/26/18 [name] --83 year old never smoker with a history of mitral valve prolapse whom I saw earlier this year for exertional dyspnea and chronic cough.  Pulmonary function testing consistent with restriction.  She has GERD and allergic rhinitis both of which could be contributing to her chronic cough.  I performed a high-resolution CT scan of the chest on 1/20 and reviewed today, this shows some patchy confluent subpleural reticular disease and groundglass attenuation with some minimal traction bronchiectasis and no frank honeycomb change.  This reflects a progression compared with 2014 and is an NSIP pattern.  Walking oximetry at her last visit did not show any exertional desaturation. She is still coughing, is using fexofenadine, takes nexium  qod.   [place], had pesticide exposure.   ROV 08/26/2018 [name] --follow-up visit for 83 year old woman with a history of mitral valve prolapse, chronic cough, dyspnea with restrictive lung disease noted on pulmonary function testing.  High Res CT scan of the chest  shows some mild interstitial disease.  She was also having cough - better with flonase; still on nexium , allegra. Her exertional tolerance is improved, she has been exercising more. She remains on Requip  for RLS.   We performed autoimmune labs last time, ANA positive at low titer (1: 80), SSA and SSB negative, RNP negative, RF negative, CCP negative, Anti-Smith negative, anti-SCL negative, DS DNA negative, anti-Jo negative, aldolase negative. She hasn't been seen in ILD clinic yet.    OV 10/08/2018  Subjective:  Patient ID: Rudell CHRISTELLA Marlowe, female , DOB: 05/21/1940 , age 53 y.o. , MRN: [id number] , ADDRESS: [address] [place] [address]   10/08/2018 -   Chief Complaint  Patient presents with   Interstitial Lung Disease    Breathing the same as it was during August 2020 office visit with Dr. Shelah     HPI Rudell CHRISTELLA Mall 83 y.o. -has been referred to the interstitial lung disease clinic because of findings of interstitial lung disease.  History is gathered from talking to her, review of Dr. Lamar Sabal notes and also the integrated ILD questionnaire.  Briefly, she tells me that she was working out at [employer] and would just notice occasional dyspnea but she really did not compare it with other people.  Then in October 2019 she started walking 3 miles daily except on the days it rains with a husband.  During this time she noticed that she was falling really behind because of shortness of breath and exertional fatigue.  This resulted  in subsequent evaluation all documented above.  Findings of interstitial lung disease with subpleural reticulation suggestive of an alternative diagnosis.  Autoimmune profile essentially negative other than trace positive ANA.  She tells me that her significant major problem is exertional dyspnea when walking stairs or walking several miles.  She did not desaturate in our office several months ago.  She does not know if she desaturates when she exerts  walking 3 miles.  Pulmonary function test earlier this year is just isolated low DLCO.  She also has like a cough.  Overall since the onset of the symptoms by exercising herself more and conditioning she is somewhat better.  She did see Dr. Redell Shallow in May 2020 and in June 2020 had a coronary calcium CT which appears to have no calcium deposits.   [facility] Integrated Comprehensive ILD Questionnaire  Symptoms:  -Dyspnea started suddenly and since it started it is better.  She says it is been present for years although she did tell me that it is only there since October 2019 when she noticed that.  Severity is listed below.  She does have a cough almost 1 year.  Since it started it is better.  It is mostly in the morning.  She does bring up some phlegm.  Early on in the morning it is green or yellow.  Since it started it is the same/better.  There is no wheezing.  She does have some chest tightness with this when she walks.  It is relieved by rest.  Cardiac work-up in June 2020 showed no calcium deposits.   Past Medical History : Positive for chronic longstanding acid reflux disease and thyroid  disease..  In addition CT scan from January 2020 shows hiatal hernia that is small..  This presence of  sclerosis in the bony structures in January 2020.-Primary care physician has been sent a message today.  There is no asthma or COPD or heart failure rheumatoid arthritis or collagen vascular disease.  She does have GERD and hiatal hernia for several years to decades.  No sleep apnea.  No blood clots.  No hepatitis.  No tuberculosis.  No pleurisy.  , ANA positive at low titer (1: 80), SSA and SSB negative, RNP negative, RF negative, CCP negative, Anti-Smith negative, anti-SCL negative, DS DNA negative, anti-Jo negative, aldolase negative. She hasn't been seen in ILD clinic yet.    ROS:  -She does have fatigue for the last several years.  She does have some back and hip issues.  She does have dry eyes.  She  does have like some dysphagia.  There is presence of hiatal hernia-there is a small.  Acid reflux for several decades.  She also reports presence of nonspecific rash   FAMILY HISTORY of LUNG DISEASE: * -Her father died of mesothelioma in 73 83/1984 at the age of 35 otherwise no lung disease.   EXPOSURE HISTORY:   -When she was 17 she smokes cigarettes but otherwise no cigarette or tobacco use or electronic cigarette.  Never smoked marijuana.  No cocaine use no intravenous drug use.   HOME and HOBBY DETAILS :  -Single[place]family home [place] setting for the last 16 years in a 82 year old home.  No mold or mildew exposure in the [facility] duct or CPAP mask or humidifier.  No mold or mildew in the bathroom.  No pet birds in the house.  No misting [place].  No feather pillows no feather duvet.  No musical instruments.  She does some occasional gardening which  she likes.  She does do some fine-needle work.   OCCUPATIONAL HISTORY (122 questions) :  = Essentially negative except for the fact when she was a child she did some tobacco growing.  She has done home gardening for 50 years.    PULMONARY TOXICITY HISTORY (27 items):  denies  Results for MARNI, FRANZONI (MRN [id number]) as of 11/07/2018 11:20  Ref. Range 02/26/2018 12:17  Anti Nuclear Antibody (ANA) Latest Ref Range: NEGATIVE  POSITIVE (A)  ANA Pattern 1 Unknown Nuclear, Speckled (A)  ANA Titer 1 Latest Units: titer 1:80 (H)  Anti JO-1 Latest Ref Range: 0.0 - 0.9 AI <0.2  Cyclic Citrullin Peptide Ab Latest Units: UNITS <16  ds DNA Ab Latest Units: IU/mL <1  RA Latex Turbid. Latest Ref Range: <14 IU/mL <14  ENA SM Ab Ser-aCnc Latest Ref Range: <1.0 NEG AI <1.0 NEG  Ribonucleic Protein(ENA) Antibody, IgG Latest Ref Range: <1.0 NEG AI <1.0 NEG  SSA (Ro) (ENA) Antibody, IgG Latest Ref Range: <1.0 NEG AI <1.0 NEG  SSB (La) (ENA) Antibody, IgG Latest Ref Range: <1.0 NEG AI <1.0 NEG  Scleroderma (Scl-70) (ENA) Antibody, IgG Latest Ref Range: <1.0  NEG AI <1.0 NEG   Results for DIONNE, KNOOP (MRN [id number]) as of 11/07/2018 11:20  Ref. Range 01/29/2018 14:31  FVC-Pre Latest Units: L 2.97  FVC-%Pred-Pre Latest Units: % 98  Results for BONI, MACLELLAN (MRN [id number]) as of 11/07/2018 11:20  Ref. Range 01/29/2018 14:31  DLCO unc Latest Units: ml/min/mmHg 14.80  DLCO unc % pred Latest Units: % 54    OV 11/07/2018  Subjective:  Patient ID: RUDELL HERO MARLOWE, female , DOB: Nov 21, 1940 , age 29 y.o. , MRN: [id number] , ADDRESS: [address] [place] [address]   11/07/2018 -   Chief Complaint  Patient presents with   Follow-up    Patient reports that she has sob with any exertion.    Follow-up interstitial lung disease  HPI CLOTIEL TROOP 83 y.o. -last seen September 2020.  After that she was supposed to have follow-up high-resolution CT chest and spirometry DLCO.  For some reason the spirometry DLCO is not done.  In the interim her symptoms remain the same as shown by the symptom score below.  She does note when she does heavy exertion such as climbing stairs or long uphill walks her pulse ox drops to 84% but quickly regains.  She is not interested in portable oxygen .  She had a repeat high-resolution CT chest in September 2020 and when compared to January 2020 there is no significant change.  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  I personally visualized the film.  There is diffuse bilateral subpleural reticulation and some traction bronchiectasis.  I myself would say it may be this is indeterminate for UIP.  But there is some minimal air trapping and she is done some gardening work.  There is no upper zonal predominance that would fit in with hypersensitive pneumonitis.  She has incidental findings of bony sclerosis that is again repeated in the CT scan.  Her primary care physician Glade Hope evaluated her.  I reviewed the note.  She is been  referred to Dr. Norleen Kidney in hematology who she is seeing November 12, 2018.  She is very confused about the fact that she has a bony sclerosis problem for which she is being referred to a blood doctor [hematologist] and she thought she is  having a blood test that was ordered by me.  I reviewed the chart and clarified this concepts   Ct Chest High Resolution  Result Date: 11/05/2018 CLINICAL DATA:  83 year old female with history of interstitial lung disease. Increased shortness of breath and cough over the past year. EXAM: CT CHEST WITHOUT CONTRAST TECHNIQUE: Multidetector CT imaging of the chest was performed following the standard protocol without intravenous contrast. High resolution imaging of the lungs, as well as inspiratory and expiratory imaging, was performed. COMPARISON:  High-resolution chest CT 02/10/2018. FINDINGS: Cardiovascular: Heart size is normal. There is no significant pericardial fluid, thickening or pericardial calcification. There is aortic atherosclerosis, as well as atherosclerosis of the great vessels of the mediastinum and the coronary arteries, including calcified atherosclerotic plaque in the left anterior descending and right coronary arteries. Mild calcifications of the mitral annulus. Mediastinum/Nodes: No pathologically enlarged mediastinal or hilar lymph nodes. Please note that accurate exclusion of hilar adenopathy is limited on noncontrast CT scans. Esophagus is unremarkable in appearance. No axillary lymphadenopathy. Lungs/Pleura: High-resolution images demonstrates some patchy areas of peripheral predominant septal thickening and subpleural reticulation, with mild cylindrical bronchiectasis and peripheral bronchiolectasis. No frank honeycombing confidently identified at this time. These findings have no definitive craniocaudal gradient. In the periphery of the mid to upper lungs there also some plaque-like areas of apparent pleuroparenchymal scarring and volume loss.  Inspiratory and expiratory imaging demonstrates minimal air trapping indicative of very mild small airways disease. Overall, these imaging findings appear stable compared to the prior study. Upper Abdomen: Aortic atherosclerosis. Musculoskeletal: Mild diffuse sclerosis throughout the visualized axial and appendicular skeleton, similar to prior examinations. There are no definite focal aggressive appearing lytic or blastic lesions noted in the visualized portions of the skeleton. IMPRESSION: 1. 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 2. Aortic atherosclerosis, in addition to 2 vessel coronary artery disease. Assessment for potential risk factor modification, dietary therapy or pharmacologic therapy may be warranted, if clinically indicated. 3. Persistent mild diffuse sclerosis throughout the visualized osseous structures without discrete aggressive appearing osseous lesions. Clinical correlation for signs and symptoms of potential infiltrative process such as myelofibrosis is suggested. Aortic Atherosclerosis (ICD10-I70.0). Electronically Signed   By: Toribio Aye M.D.   On: 11/05/2018 14:36     OV 12/22/2018  Subjective:  Patient ID: Rudell CHRISTELLA Sprinkle, female , DOB: 1940/05/30 , age 40 y.o. , MRN: [id number] , ADDRESS: [address] [place] [address]   12/22/2018 -   Chief Complaint  Patient presents with   Follow-up    Pt states she has been doing good since last visit. Pt is still coughing and will get up clear mucus in the morning.   Follow-up interstitial lung disease with CT scan October 2020 being indeterminate versus alternate diagnosis.  History of gardening.  Trace  autoimmune ANA positive.  Mild progression since 2014  HPI SHELLEY COCKE 83 y.o. -returns for follow-up.  She presents with her husband who I am meeting for the first time.  In the interim she met Dr. Kerrin thoracic surgeon for surgical lung biopsy.  Her husband also met with him.  I reviewed the note.  She tells me that given the morbidity with surgical lung biopsy she wants to  undergo bronchoscopy with lavage and transbronchial biopsy first.  She understands the inherent limitations of these procedure in terms of diagnosis.  But she wants to take the lower risk profile.  Overall she feels stable.  Her symptom score is improved compared to the past.  Her walking desaturation test shows exaggerated drop in pulse ox but this was done with her wearing the mask.  She did not feel any dyspnea.  In terms of her bony sclerosis she has seen Dr. Federico at [facility].  I reviewed the note.  He has reassured her.  Risks of pneumothorax, hemothorax, sedation/anesthesia complications such as cardiac or respiratory arrest or hypotension, stroke and bleeding all explained. Benefits of diagnosis but limitations of non-diagnosis also explained. Patient verbalized understanding and wished to proceed.   They want to have the bronchoscopy after the holidays of Christmas and new year.  This is because their house is undergoing remodeling currently.       OV 03/16/2019  Subjective:  Patient ID: Rudell CHRISTELLA Sprinkle, female , DOB: 29-Jul-1940 , age 34 y.o. , MRN: [id number] , ADDRESS: [address] [place] [address]   03/16/2019 -   Chief Complaint  Patient presents with   Follow-up    PFT performed today.  Pt states she has been doing okay since last visit and states her breathing is about the same.     Finally able to review esults of [facility] send out test to [place] . Date of test is 02/05/2019. 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 -returns for follow-up to discuss  bronchoscopy and lavage results.  In the interim no new respiratory issues.  They are all stable.  However she is having significant musculoskeletal issues with shoulder pain.  Serology from a year ago was negative.  They wanted to know about relatedness.  I told him it is probably not related.  We went over the bronchoscopy lavage results which showed some dominance of neutrophils consistent with UIP.  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   We had a long discussion about the benefits, risks and limitations of antifibrotic therapy.  We also discussed the choice between pirfenidone  and nintedanib.       Results for LEVEDA, KENDRIX (MRN [id number]) as of 03/16/2019 11:12  Ref. Range 01/29/2018 14:31 03/16/2019 08:42  FVC-Pre Latest Units: L 2.97 2.91  FVC-%Pred-Pre Latest Units: % 98 97   Results for ANNALIZ, AVEN (MRN [id number]) as of 03/16/2019 11:12  Ref. Range 01/29/2018 14:31 03/16/2019 08:42  DLCO unc Latest Units: ml/min/mmHg 14.80 12.32  DLCO unc % pred Latest Units: % 54 60     OV 06/11/2019 - telephine visit. Patient identified with 2 pHI, risks, benefit, limitations of tele visit explained  Subjective:  Patient ID: Rudell CHRISTELLA Sprinkle, female , DOB: 1940-09-17 , age 2 y.o. , MRN: [id number] , ADDRESS: [address] [place] [address] ate of test is 02/05/2019. 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 -in this telephone visit patient is now on pirfenidone .  She says she is on pirfenidone  since mid April 2021.  She is on 3 pills 3 times a day.  She spacing them  4 hours apart but having some intermittent nausea every few days.  She did have an urticaria but that was before she started pirfenidone  was related to Covid vaccine  that is now resolved.  She is asking about exercising at the [facility] and Covid precautions.  I advised her because she is vaccinated that the risk is low but not 0 and to take adequate precautions as tolerated.  She had liver function test and this is normal.  Her next appointment for pulmonary function test is in mid July 2021.  Recommended she make a face-to-face visit with me at that time.   OV 08/21/2019   Subjective:  Patient ID: Rudell CHRISTELLA Sprinkle, female , DOB: Oct 02, 1940, age 77 y.o. years. , MRN: [id number],  ADDRESS: [address] [place] [address] PCP  Geofm Glade PARAS, MD Providers : Treatment Team:  Attending Provider: Geronimo Amel, MD  Type of visit: Telephone Circumstance: COVID-19 national emergency Identification of patient Rudell CHRISTELLA Sprinkle - 2 person identifier Risks: Risks, benefits, limitations of telephone visit explained Patient location: home This provider location: [facility]      Chief Complaint  Patient presents with   Follow-up    PFT 7/27--c/o sob with stairs and throat clearing mainly in the morning. stopped Esbriet  on 08/12/2019.   Follow-up interstitial lung disease with CT scan October 2020 being indeterminate versus alternate diagnosis.  History of gardening.  Trace autoimmune ANA positive.  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 -presents for this telephone visit for IPF.  On this telephone visit she was identified with 2 person identifier.  Risks, benefits and limitations of telephone visit explained.  After last visit her GI symptoms worsen.  She did see Dr. Ezzie Sous gastroenterologist.  She underwent endoscopy.  Dr. Sous did send the results to me I do not have this.  I remember Dr. Sous calling me and discussing  patient's GI side effects.  These were from pirfenidone  so on August 12, 2019 based on my advise she start pirfenidone .  Since then her symptoms of GI nature have resolved.  Her respiratory symptoms continue to be stable.  These are all documented below.  At this point in time she feels that her quality of life is very important.  She does not want to go through the kind of GI side effect she went through with pirfenidone .  She would rather deal with the disease.  She is aware that nintedanib is a standard of care therapeutic option.  She is also aware after discussion the clinical trials as a care option particularly in the past to discovering new therapies that do not have this GI side effects that pirfenidone  poses.  She had a lot of questions about Covid vaccine.  She is fully vaccinated.  She is worried about her grandson was refusing the vaccine.      OV 09/29/2019   Subjective:  Patient ID: Rudell CHRISTELLA Sprinkle, female , DOB: 1940/05/03, age 84 y.o. years. , MRN: [id number],  ADDRESS: [address] [place] [address] PCP  Geofm Glade PARAS, MD Providers : Treatment Team:  Attending Provider: Geronimo Amel, MD   Chief Complaint  Patient presents with   Follow-up    LFT today?  pulmonary rehab or exercises.  inhaler trial, felt better after Advair.   Follow-up interstitial lung disease/IPF with CT scan October 2020 being indeterminate versus alternate diagnosis.  History of gardening.  Trace autoimmune ANA positive.  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 Date of test is 02/05/2019. 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  -Date of giving diagnosis is March 16, 2019  - MDD 05/05/19  - esbriet  since 05/06/19- stopped 08/12/19 due to severe GI side effect    HPI CARRINE KROBOTH 83 y.o. -    presesnts  With her husband for IPF followup.  Overall she is doing  well.  She is not taking pirfenidone  since July 2021.  After that she has gained weight.  All her GI symptoms have resolved.  She goes for daily walks.  She says when she reaches the top of the hill sometimes she desaturates to 88% but not always.  She monitors this closely.  She is asking for other exercises that can improve her endurance and lung function.  We discussed that exercises do not improve lung function.  She is willing to participate in pulmonary rehabilitation.  We discussed the alternative of taking nintedanib but because of the severe GI side effects that she had with Esbriet  she is nervous about starting nintedanib and does not want to.  I introduced her to the concept of clinical trials as a care option.  At this point in time she wants to process this information.  We did discuss the fact that IPF is a progressive disease and antifibrotic's are indicated early in the course of the disease.  She prefers to have supportive care approach.  She wants to focus on strengthening her overall physical shape.   OV 06/30/2021 -standard of care visit in this research patient.  Subjective:  Patient ID: Rudell CHRISTELLA Sprinkle, female , DOB: 1940/08/04 , age 17 y.o. , MRN: [id number] , ADDRESS: [address] [place] [address] PCP Geofm Glade PARAS, MD Patient Care Team: Geofm Glade PARAS, MD as PCP - General (Internal Medicine) Pietro Redell RAMAN, MD as Consulting Physician (Cardiology) Shelah Lamar RAMAN, MD as Consulting Physician (Pulmonary Disease) Fate Morna SAILOR, [facility] as Pharmacist (Pharmacist) Carlie Clark, MD as Consulting Physician (Otolaryngology) [facility] as Consulting Physician (Ophthalmology)  This Provider for this visit: Treatment Team:  Attending Provider: Geronimo Amel, MD    06/30/2021 -   Chief Complaint  Patient presents with   Follow-up    Pt recently had a CT of her sinuses and is here to discuss the results.  Pt states she has not been  feeling well and states she is still having problems with her sinuses. States her breathing has been doing okay.     HPI MEGEN MADEWELL 83 y.o. -on April 19, 2021 she did have some side effects from inhaled medical product.  After that we reduce the dose and slowly escalated back and then she was tolerating it fine.  On 05/22/2021 she was prescribed antibiotics for some sinus issues she did have week 8 study visit on 06/01/2021 and was doing okay.  But then on 06/20/2021 she started complaining of headache and sinus congestion and coughing and blowing yellowish mucus.  At this point in time she was taking inhaled treprostinil versus placebo investigational medical product at 4 times daily each time 12 puffs.  We told  her to reduce it to 9 puffs 4 times daily.  She tells me that perhaps she is marginally better after reducing the dose but she still having headaches.  She feels like her head is full particularly in the top part.  She also has significant cough.  She says during and after she takes the inhaled medical investigational product her cough score is 9 out of 10.  Rest of the day it is around 3 out of 10.  She coughs so much that she sometimes brings out mild yellow mucus that is like glue.  There is no diarrhea fatigue or weight loss or syncope or hemoptysis.  Definitely all the symptoms only after starting the research protocol.  She also some sore throat.  She has tried some remedial measures but these have not helped.  These include yogurt and ginger ale  For the dyspnea itself she feels stable.  She also tells me that she is able to walk a mile.  She is able to outplace her husband.  She feels her husband's declining health.  She says when she walks a mile she puts a finger probe after she rests and the pulse ox is 94%.  She is worried she might desaturate below 88%.  She does not have a forehead pulse oximeter.  She says the finger pulse ox meter does not stay on while she is walking.  I gave her a  contact of the patient support group leader to figure out a way to get a forehead probe.  Also showed her various finger proximal she can get that can stay on her finger while she exercises.  Told her the safety limit is greater than 88%.  Symptom scores below show that her symptoms are better than 2 years ago slightly.  This is from a respiratory standpoint with a cough is definitely worse after she went on the study.  And the headache is new.  She had a CT scan sinus that shows chronic findings.      OV 05/04/2022  Subjective:  Patient ID: Rudell CHRISTELLA Sprinkle, female , DOB: July 03, 1940 , age 18 y.o. , MRN: [id number] , ADDRESS: [address] [place] [address] PCP Geofm Glade PARAS, MD Patient Care Team: Geofm Glade PARAS, MD as PCP - General (Internal Medicine) Pietro Redell RAMAN, MD as Consulting Physician (Cardiology) Shelah Lamar RAMAN, MD as Consulting Physician (Pulmonary Disease) Fate Morna SAILOR, [facility] as Pharmacist (Pharmacist) Carlie Clark, MD as Consulting Physician (Otolaryngology) [facility] as Consulting Physician (Ophthalmology)  This Provider for this visit: Treatment Team:  Attending Provider: Geronimo Amel, MD  05/04/2022 -   Chief Complaint  Patient presents with   Follow-up    F/u after researched      HPI Rudell CHRISTELLA Rueda 83 y.o. -returns for routine follow-up.  In the interim she was on inhaled treprostinil versus placebo but she did not tolerate it at all.  She had all the classic side effects of the renal drug.  We made multiple attempts in holiday and giving the drug back but she did not tolerate it at all and she finally quit.  She is out of the study now.  She did do 1 year follow-up and exited the study.  Currently she is on supportive care.  She walks on Monday through Friday 3 miles a day.  This is through [facility] and she has gotten the best walker award.  She does have some chest burning when she starts the  walking but it  does get better as she walks further.  She is not limited by dyspnea.  She checks her pulse ox after the walk a few seconds later and the pulse ox is 93% 94% at the lowest.  I did tell her to monitor it while she walks.  In terms with therapy she is not interested in standard of care antifibrotic therapy because of side effect profile.  She tried pirfenidone  in the past.  She is also been intolerant to treprostinil.  She is absolutely not interested in nintedanib.  We talked about doing the revert study sponsored by [facility].  Is an oral pill.  She read the consent form which we gave it to her some weeks ago.  She does not want to do the study because of side effect profile.  There is another study called MOONSCAPE by [place].  Is an injection study.  Current patients are tolerating the study well so far.  She might be interested in this..  Will probably aim to recruit and Q3  2024.    OV 09/17/2022  Subjective:  Patient ID: Rudell CHRISTELLA Sprinkle, female , DOB: November 15, 1940 , age 33 y.o. , MRN: [id number] , ADDRESS: [address] [place] [address] PCP Geofm Glade PARAS, MD Patient Care Team: Geofm Glade PARAS, MD as PCP - General (Internal Medicine) Pietro Redell RAMAN, MD as PCP - Cardiology (Cardiology) Pietro Redell RAMAN, MD as Consulting Physician (Cardiology) Shelah Lamar RAMAN, MD as Consulting Physician (Pulmonary Disease) Fate Morna SAILOR, [facility] (Inactive) as Pharmacist (Pharmacist) Carlie Clark, MD as Consulting Physician (Otolaryngology) [facility] as Consulting Physician (Ophthalmology)  This Provider for this visit: Treatment Team:  Attending Provider: Geronimo Amel, MD    09/17/2022 -   Chief Complaint  Patient presents with   Follow-up    Breathing is about the same. She denies any new co's. She is walking 3 miles daily.    HPI MARSHEA WISHER 83 y.o. -presents for follow-up.  She is currently on supportive care.  She started off the visit by saying  she was doing stable but later she did admit that when she does a 3 mile walk sometimes she gets dizziness but she is not checking her pulse ox but she is still able to finish her 3 miles at the same time.  She also when she does vacuuming she gets quite dyspneic.  No new medical problems other than the fact her leg and feet have chronic numbness and associated with restless legs. Interim Health status: No new complaints No new medical problems. No new surgeries. No ER visits. No Urgent care visits. No changes to medications.  Symptom scores and x-rays hypoxemia test remained stable but a pulmonary function test today shows a drop in DLCO.  We discussed this.  In terms of treatment she is only on supportive care after having failed antifibrotic's and previous intolerance to inhaled treprostinil study drug.  She is looking at the injection based therapy via [employer] protocol.       OV 10/23/2022  Subjective:  Patient ID: Rudell CHRISTELLA Sprinkle, female , DOB: 1940/02/11 , age 33 y.o. , MRN: [id number] , ADDRESS: [address] [place] [address] PCP Geofm Glade PARAS, MD Patient Care Team: Geofm Glade PARAS, MD as PCP - General (Internal Medicine) Pietro Redell RAMAN, MD as PCP - Cardiology (Cardiology) Pietro Redell RAMAN, MD as Consulting Physician (Cardiology) Shelah Lamar RAMAN, MD as Consulting Physician (Pulmonary Disease) Fate Morna SAILOR, [facility] (  Inactive) as Pharmacist (Pharmacist) Carlie Clark, MD as Consulting Physician (Otolaryngology) [facility] as Consulting Physician (Ophthalmology)  This Provider for this visit: Treatment Team:  Attending Provider: Geronimo Amel, MD    10/23/2022 -   Chief Complaint  Patient presents with   Follow-up    Ct scan f/u      HPI TAURI ETHINGTON 83 y.o. -returns for follow-up.  She continues a 3 mile walks.  She feels that the exertional shortness of breath that she was having during the 3 mile walk is somewhat better.   Reviewing of the symptom score tells me that she has been stable overall and it is hard to discern.  The big concern at last visit was a drop in pulmonary function test especially DLCO.  There was concern about pulmonary hypertension versus pulmonary fibrosis worsening so so we did BNP and this was elevated increasing concern for pulmonary hypertension but the echo shows only grade 1 diastolic dysfunction normal RV.  Were also concerned about worsening pulmonary fibrosis we did a high-resolution CT chest and radiology feels it has been stable for 2 years.  Stability in lung function is confirmed by stability and symptom score and also exercise hypoxemia test.  The big change she has there is a pulmonary function test.  This could be a red herring based on variations.  Nevertheless the BNP was high.  Therefore we will recheck this today.  She did mention to me that 7 or 10 days ago she did have an episode of palpitations while doing a 3 mile walk heart rate was 170 on the Fitbit and she had to stop.  No known atrial fibrillation diagnosis.  Same thing happened 2 months ago but she forgot to mention this to Dr. Pietro on her visit early August 2024.  I have reached out to him via electronic message.  In terms of her pulmonary fibrosis therapy she is interested in other therapies that have minimal side effect profile.  The approved and antifibrotic's all have side effect profile.  She is interested experimental protocol.  We went over VIXARELIMAB monoclonal antibody given every few weeks.  There are some interesting data with prurigo.  I showed her that in the side effect profile.  She is taken the consent form.  She is interested in this at this point.     CT Chest data from date: 09/20/22  Narrative & Impression  CLINICAL DATA:  83 year old female with history of interstitial lung disease. Follow-up study.   EXAM: CT CHEST WITHOUT CONTRAST   TECHNIQUE: Multidetector CT imaging of the chest was  performed following the standard protocol without intravenous contrast. High resolution imaging of the lungs, as well as inspiratory and expiratory imaging, was performed.   RADIATION DOSE REDUCTION: This exam was performed according to the departmental dose-optimization program which includes automated exposure control, adjustment of the mA and/or kV according to patient size and/or use of iterative reconstruction technique.   COMPARISON:  High-resolution chest CT 11/14/2020.   FINDINGS: Cardiovascular: Heart size is normal. There is no significant pericardial fluid, thickening or pericardial calcification. There is aortic atherosclerosis, as well as atherosclerosis of the great vessels of the mediastinum and the coronary arteries, including calcified atherosclerotic plaque in the left anterior descending and right coronary arteries. Mild calcification of the mitral annulus.   Mediastinum/Nodes: No pathologically enlarged mediastinal or hilar lymph nodes. Please note that accurate exclusion of hilar adenopathy is limited on noncontrast CT scans. Esophagus is unremarkable in  appearance. No axillary lymphadenopathy.   Lungs/Pleura: Widespread areas of peripheral predominant septal thickening, subpleural reticulation, scattered mild cylindrical bronchiectasis and peripheral bronchiolectasis are again noted throughout the lungs bilaterally. No definite honeycombing noted. No discernible craniocaudal gradient. Overall, these findings appear essentially unchanged compared to the prior study. Inspiratory and expiratory imaging is unremarkable. No acute consolidative airspace disease. No definite suspicious appearing pulmonary nodules or masses are noted. Extensive bilateral apical pleuroparenchymal thickening and architectural distortion, similar to the prior study.   Upper Abdomen: Aortic atherosclerosis.   Musculoskeletal: There are no aggressive appearing lytic or blastic lesions  noted in the visualized portions of the skeleton.   IMPRESSION: 1. 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 Given the long-term stability, an alternative diagnosis such as fibrotic phase nonspecific interstitial pneumonia (NSIP) should also be considered. 2. Aortic atherosclerosis, in addition to left anterior descending and right coronary artery disease. Please note that although the presence of coronary artery calcium documents the presence of coronary artery disease, the severity of this disease and any potential stenosis cannot be assessed on this non-gated CT examination. Assessment for potential risk factor modification, dietary therapy or pharmacologic therapy may be warranted, if clinically indicated.   Aortic Atherosclerosis (ICD10-I70.0).     Electronically Signed   By: Toribio Aye M.D.   On: 09/29/2022 12:00      Latest Reference Range & Units 09/17/22 12:05  Pro B Natriuretic peptide (BNP) 0.0 - 100.0 pg/mL 129.0 (H)  (H): Data is abnormally high   ECHO 09/18/22   IMPRESSIONS     1. Left ventricular ejection fraction, by estimation, is 55 to 60%. The  left ventricle has normal function. The left ventricle has no regional  wall motion abnormalities. Left ventricular diastolic parameters are  consistent with Grade I diastolic  dysfunction (impaired relaxation).   2. Right ventricular systolic function is normal. The right ventricular  size is normal.   3. Left atrial size was mildly dilated.   4. The mitral valve is normal in structure. Trivial mitral valve  regurgitation. No evidence of mitral stenosis.   5. The aortic valve is tricuspid. There is mild calcification of the  aortic valve. Aortic valve regurgitation is trivial. Aortic valve  sclerosis/calcification is present, without any evidence of aortic  stenosis.   6. The  inferior vena cava is normal in size with greater than 50%  respiratory variability, suggesting right atrial pressure of 3 mmHg.     OV 02/21/2023  Subjective:  Patient ID: Rudell CHRISTELLA Sprinkle, female , DOB: [birth year] , age 59 y.o. , MRN: [id number] , ADDRESS: [address] [place] [address] PCP Geofm Glade PARAS, MD Patient Care Team: Geofm Glade PARAS, MD as PCP - General (Internal Medicine) Pietro Redell RAMAN, MD as PCP - Cardiology (Cardiology) Pietro Redell RAMAN, MD as Consulting Physician (Cardiology) Shelah Lamar RAMAN, MD as Consulting Physician (Pulmonary Disease) Fate Morna SAILOR, [facility] (Inactive) as Pharmacist (Pharmacist) Carlie Clark, MD as Consulting Physician (Otolaryngology) [facility] as Consulting Physician (Ophthalmology)  This Provider for this visit: Treatment Team:  Attending Provider: Geronimo Amel, MD    02/21/2023 -   Chief Complaint  Patient presents with   Follow-up    Pt states she still has a ongoing productive cough ( green phlegm ) states she is feeling stronger and less fatigue.     SABRA  HPI Braileigh Landenberger  Strauch 83 y.o. -returns for follow-up.  At this follow-up visit she is admitting that she is desaturating with exertion.  She notices when she works out on her exercise bike she goes down to 89 or 88%.  She stopped going to the [facility] because of pulse oximeter is alarming when she gets to 88% and she is doing it easily.  She says at that time she does not necessarily feel short of breath although at later time point she did admit for shortness of breath but overall symptom questionnaire shows continued stability but she is definitely desaturating.  She continues to have tachycardia review of external records indicate she is having SVTs.  Cardiology tried Lopressor  but she did not tolerate.  She has a historical tolerance issues with many medications.  She did tolerate the oral pill versus placebo on [place] study but she did not  tolerate standard of care Esbriet .  She did not tolerate inhaled treprostinil versus placebo in a study which we think she got the actual study drug..  Today exercise hypoxemia test shows worsening she is desaturating to 88%.  This something she had not done with similar exertion in the past.  I had a frank conversation with her I did indicate to her that I am really concerned the disease is progressive.  Indicated to her that she should strongly consider trying standard of care nintedanib.  I went over the diarrhea side effects.  She is apprehensive of the side effect but also concerned about the $2000 co-pay cost.  I told her this is standard under the new inflation reduction act.  She is still processing the information.  I told her that the advent of exercise hypoxemia is a negative prognostic sign.  Told her to consider at least option B  which is to get into a clinical trial with injection monoclonal antibody Vixarelimab versus placebo.  So far all patients are tolerating it well.  Short of the side effect profile.  She already took the consent last time but did not read it.  We are giving her the consent again.  Told her she can add standard of care nintedanib once she starts the study.  Least preferred option is to continue supportive care.   For her quality of life:  - recommend portoabl o2 - test ono      OV 03/14/2023  Subjective:  Patient ID: Rudell CHRISTELLA Sprinkle, female , DOB: 12/17/1940 , age 52 y.o. , MRN: [id number] , ADDRESS: [address] [place] [address] PCP Geofm Glade PARAS, MD Patient Care Team: Geofm Glade PARAS, MD as PCP - General (Internal Medicine) Pietro Redell RAMAN, MD as PCP - Cardiology (Cardiology) Pietro Redell RAMAN, MD as Consulting Physician (Cardiology) Shelah Lamar RAMAN, MD as Consulting Physician (Pulmonary Disease) Fate Morna SAILOR, [facility] (Inactive) as Pharmacist (Pharmacist) Carlie Clark, MD as Consulting Physician (Otolaryngology) [facility] as Consulting Physician (Ophthalmology)  This Provider for this visit: Treatment Team:  Attending Provider: Geronimo Amel, MD    03/14/2023 -   Chief Complaint  Patient presents with   Follow-up    HPI AVIELLA DISBROW 83 y.o. -presents for acute visit follow-up.  After I saw her last time she had acute bronchitis and was desaturating ended up in the ER last Thursday had to call an antibiotic and prednisone .  She now presents with her husband.  She says she is a lot better but only 50% better.  She still has otalgia sore throat chest  being sore coughing colored sinus drainage.  She is finished with her doxycycline  she still has a few days of prednisone  left.  She wants a repeat course of antibiotic.  She says that she still desaturates at home walking 50 feet.  Of note last time we prescribed portable oxygen  but it appears that she also got a oxygen  concentrator at home.  She had a overnight pulse oximetry study and in this there is no desaturations past 4-1/2 minutes.  She is slightly bradycardic but she is on Lopressor .  Therefore I did advise her that she could return her concentrator and just retain her portable oxygen  system.  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   OV 04/25/2023  Subjective:  Patient ID: Rudell CHRISTELLA Sprinkle, female , DOB: 1940/08/14 , age 13 y.o. , MRN: [id number] , ADDRESS: [address] [place] [address] PCP Geofm Glade PARAS, MD Patient Care Team: Geofm Glade PARAS, MD as PCP - General (Internal Medicine) Pietro Redell RAMAN, MD as PCP - Cardiology (Cardiology) Pietro Redell RAMAN, MD as Consulting Physician (Cardiology) Shelah Lamar RAMAN, MD as Consulting Physician (Pulmonary Disease) Fate Morna SAILOR, [facility] (Inactive) as Pharmacist (Pharmacist) Carlie Clark,  MD as Consulting Physician (Otolaryngology) [facility] as Consulting Physician (Ophthalmology)  This Provider for this visit: Treatment Team:  Attending Provider: Geronimo Amel, MD    04/25/2023 -   Chief Complaint  Patient presents with   Follow-up    Breathing is doing well and she denies any co's today.     HPI MANASI DISHON 83 y.o. -returns for follow-up.  She states since the sinusitis she has improved.  She feels she no longer needs portable oxygen .  At the [facility] her husband states that she is walking a little slower but she does not desaturate.  A technician there help monitor her pulse ox when she walked 2 miles and she did not desaturate below 90%.  She feels back to baseline.  Symptom score have improved.  Exercise hypoxemia test here is stable.  She wanted to discuss next step in therapies.  She called the office a few days ago wanting to see a research consent.  She then met with the research coordinator yesterday and took to consent form for short-term phase 2, 20-28 weeks oral drug studies.  When she first came into the office she said she was going to do the study at least one of them.  Then later she said she wanted to do approved antifibrotic nintedanib.  In the past she had tried pirfenidone  but failed and swore because of the side effect she will never take an approved antifibrotic.  We then discussed nintedanib explained to her this is standard of care.  Explained to her that patient should always consider standard of care therapies first.  Explained to her that there is a 10-year safety profile on the medication.  Explained to her monitoring standards.  She then agreed to do nintedanib but just as I wrote the instructions she changed her mind.  She and her husband expressed cost concerns.  We went over the inflation reduction act and her co-pay.  She reflected on this.  She then wanted to know if she could do the study given the fact she was stable and  then take approved antifibrotic's few to  several weeks after starting a research protocol.  She feels that she will benefit from 2 medications.  We went over therapeutic misconception.  Explained to her that the purpose of the study especially phase 2 is to improve society and help develop new medications.  She believes will get monitoring with the study.  I agreed with this.  At the end she is going to reflect on all this.  She is to research concerns to look at.  We are going to check with the sponsor if she can get the study drug and then start approved antifibrotic.  She will also reflect if she wants to take the approved antifibrotic first and then do the study when she is stable on it for a few months.   OV 06/25/2023  Subjective:  Patient ID: Rudell CHRISTELLA Sprinkle, female , DOB: July 17, 1940 , age 72 y.o. , MRN: [id number] , ADDRESS: [address] [place] [address] PCP Geofm Glade PARAS, MD Patient Care Team: Geofm Glade PARAS, MD as PCP - General (Internal Medicine) Pietro Redell RAMAN, MD as PCP - Cardiology (Cardiology) Pietro Redell RAMAN, MD as Consulting Physician (Cardiology) Shelah Lamar RAMAN, MD as Consulting Physician (Pulmonary Disease) Fate Morna SAILOR, [facility] (Inactive) as Pharmacist (Pharmacist) Carlie Clark, MD as Consulting Physician (Otolaryngology) [facility] as Consulting Physician (Ophthalmology)  This Provider for this visit: Treatment Team:  Attending Provider: Geronimo Amel, MD    06/25/2023 -   Chief Complaint  Patient presents with   Interstitial Lung Disease   Cough   Shortness of Breath   HPI ROGENA DEUPREE 83 y.o. -this is a standard of care visit.  This is not a research visit.  She was randomized to study drug on the TVARDI REVERT  study STAT3 inhibitor.  This was end of April 2025.  I saw her on 06/13/2023 during recent visit and she had multiple adverse events.  This is all documented.  We then had to stop the study drug.  Then a  week later she did follow-up with nurse practitioner 06/19/2023 and she was still feeling poorly.  She also had new bruising in the left cheek that picked up 2 days prior to the nurse practitioner visit.  She is now here for standard of care visit.  She is not taking study drug.  At this visit she presents with her husband.  And she has multiple symptoms.  Specifically  -Nausea and diarrhea have resolved approximately 1 day after she saw nurse practitioner Madelin Calin on 06/19/2023  -Xerostomia dry mouth still persists at a mild level  -Low appetite still continues.  Current severity scale is 4 out of 5.  This is improving this started a week after study drug but even without the study drug it still continues although it is improving  -She picked up a bruise in her left cheek 2 days after she saw Madelin Calin on 06/19/2023.  It is improving currently at a 7 cm longitudinal and 3 cm horizontal.  She believes it came from sleeping and rubbing her left wrist on the left cheek.  She believes it is from aspirin .  I agree it is not from study drug  -Chest pains have resolved but she did have a standard of care EKG today and the T wave abnormalities still persist.  I have ordered an echocardiogram  -Fatigue: This still persist.  There is no change it is rated as 5 out of 5.  But she is able to get  out of bed and do her ADLs.  At this point I am not sure it is related to study drug  -Hyponatremia has resolved.  Sodium was 130 on 06/13/2023.  Resolved on 06/20/2023  -She had positive D-dimer and then on 06/20/2023.  CT angiogram chest and there is no pulmonary embolism.   - She is indicating green sputum for the last 10 days very similar to urgent care visit.  I diagnosed acute bronchitis will give her azithromycin  and prednisone  5-day course each  -She is reporting insomnia for the last few weeks.  Sleeping only 2 to 3 hours each night.  Cause not sure.  Not related to study drug   OV  09/16/2023  Subjective:  Patient ID: Rudell CHRISTELLA Sprinkle, female , DOB: [birth year] , age 76 y.o. , MRN: [id number] , ADDRESS: [address] [place] [address] PCP Geronimo Amel, MD Patient Care Team: Geronimo Amel, MD as PCP - General (Pulmonary Disease) Pietro Redell RAMAN, MD as PCP - Cardiology (Cardiology) Pietro Redell RAMAN, MD as Consulting Physician (Cardiology) Shelah Lamar RAMAN, MD as Consulting Physician (Pulmonary Disease) Fate Morna SAILOR, [facility] (Inactive) as Pharmacist (Pharmacist) Carlie Clark, MD as Consulting Physician (Otolaryngology) [facility], [facility], [facility] as Consulting Physician (Ophthalmology)  This Provider for this visit: Treatment Team:  Attending Provider: Geronimo Amel, MD      Follow-up interstitial lung disease/IPF with CT scan October 2020 being indeterminate versus alternate diagnosis.  History of gardening.  Trace autoimmune ANA positive.  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 Date of test is 02/05/2019. 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  -Date of giving diagnosis is March 16, 2019  - MDD 05/05/19  - esbriet  since 05/06/19- stopped 08/12/19 due to severe GI side effect   IPF patient on -Failed Esbriet  in 2021 because of  of severe GI side effects  --On inhaled treprostinil versus placebo [[place]] -phase 3 trial  -Consent February 2023  -Randomization April 06 2021 -> QUIT early due to side effects (presumably got real drug) -> completed 1 year follow-up spring 2024 - TVARDI REVERRT PHAE 2 study - end April 2025 - randmied -> completed study but quit drug during study due to various AE  - OFEV  chaellenge - low dose protocol early Aug 2025  - After years of negotiation finally agreed to do standard of care therapy with nintedanib instead of research protocol.  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  -> No treatment due to prior intoelratence to tyvaso in trial setting 2023  #Hiatal hernia   #Nintedanib/Ofev  requires intensive drug monitoring due to high concerns for Adverse effects of , including  Drug Induced Liver Injury, significant GI side effects that include but not limited to Diarrhea, Nausea, Vomiting,  and other system side effects that include Fatigue,  weight loss. Cardiac side effects are a black box warning as well. These will be monitored with  blood work such as LFT initially once a month for 6 months and then quarterly   09/16/2023 -   Chief Complaint  Patient presents with   Follow-up    IPF f/u No changes since LOV     HPI ALORAH MCREE 83 y.o. -returns for follow-up.  She is now on nintedanib.  She had failed Esbriet  in 2021 after that has been on various study protocols but recently has had side effects with both treprostinil and another STAT3 inhibitor.  And she has had progressive disease therefore after much counseling she agreed to do nintedanib.  She started low-dose protocol.  She is taking donor samples because she is very worried about side effects.  She is only taking 1 mg once daily which is a subtherapeutic dose.  She is been doing this since early August 2025.  I did indicate to her that it is time to increase and then challenge.  Compared to recent visit she is more stable.  A lot of the adverse events from the time she was on the study have resolved.  She only still has some atypical chest tightness in the lower chest.  I did indicate to her this could be because of fibrosis.  Other pulmonary hypertension.  In the interim she is been diagnosed with WHO group 3 pulmonary hypertension with a PVR of 4.  We decided against doing treprostinil because she had failed this versus placebo in a study getting classic side effects of  treprostinil.    Other issues That she complained of insomnia.  I offered her referral to the sleep doctor but she will talk to primary care to start with    SYMPTOM SCALE - ILD     Inhaled Tyvaso v Placebo. 05/04/2022 Supportive care  09/17/2022 Supporitve care 10/23/2022 Supportive care 02/21/2023 Supportive care 03/14/2023 Acute bronchitis sinusitis otherwise supportive care.  Just completed doxycycline .  Is on prednisone  taper. 04/25/2023  06/25/2023 Tvardid sutdy subject - pff study drug follwing several AE  Took study drug from 05/16/2023 through 06/12/2023 09/16/2023 Low-dose of Ofevsince early August 2025 currently taking her 100 mg once daily  O2 use 10/08/2018  11/07/2018  12/22/2018  06/30/21          Shortness of Breath 0 -> 5 scale with 5 being worst (score 6 If unable to do)  ra ra ra ra ra ra ra ra Uses portable oxygen  at [facility] well  At rest 0 0 0 0 0 0 0 5 0 3  1  Simple tasks - showers, clothes change, eating, shaving 1 1 0 1 1 1.5 1 4 1 2 5   Household (dishes, doing bed, laundry) 2 2 2 3 1  for mos stuff to 5 with vaccuming (3) 1.5 (4 vaccuum) 1.5 3 4.5 3 3   Shopping 2 2 0.5 2 2 2 3  x 2.5  2  Walking level at own pace 2 1 0.5 1 1 1  x 4 1 2 2   Walking up Stairs 4 5 3  2.5 3.5 2.5 3.5 5 2 4 4   Total (40 - 48) Dyspnea Score 11 11 6  9.5 9.5 8.5 9 21 11 14 17   How bad is your cough? 1 3 2.5 5 3 2  3.5 5 0 3 2  How bad is your fatigue 2 2.5 1.5 am, 4.5 pm 4 2 2.5 4.5 4 1 5  x  nausea    1 0 0 1 0 0 4 1  vomit    0 00 0 0 00 0 0 0  diarrhea    0 0 0 0 0 0 0 0  anxiet    00 0 0 0 0 0 0 0  depression    0 0 0 0 0 0 0 0  pain    Headacne,, sinus complaints  0 0 0 0 0 0  0  Simple office walk 185 feet x  3 laps goal with forehead probe 06/30/2021  09/17/2022  10/23/2022  02/21/2023  02/21/2023  03/14/2023  04/25/2023   O2 used ra ra ra ra ra ra ra  Number laps completed 3 Sit stand x 10 Sit stand x 15   Sit stand x 15 Sit and stnd x 15  Comments about pace Fast pace  moderate   1 Tii  1 min and 2 sec  Resting Pulse Ox/HR 96% and 74/min 96% and HR 62 97% and HR 75 100% and HR 69 100% n 100% and HR 100 %and  70     98% and HR 89 88% and HR 103 96% after 2 LAPS on 2 LNC  95% and HR 82  Final Pulse Ox/HR 96% and 117/min 94% and HR 77 94% and HR 73      Desaturated </= 88% no no no      Desaturated <= 3% points n no yes      Got Tachycardic >/= 90/min yes no no      Symptoms at end of test No complaints No compalints No complaints    Level 5 dyspnea at peak  Miscellaneous comments Sx               SIT STAND TEST - goal 15 times   06/25/2023  09/16/2023   O2 used ra ra  PRobe - finter or forehead finger finger  Number sit and stand completed - goal 15 15 12   Time taken to complete x 54 sec  Resting Pulse Ox/HR/Dyspnea  97% and 81/min and dyspnea of 7/10  95$ ad HR 78 ad score 0  Peak measures 95 % and 100/min and dyspnea of 9/10 94% and HR 94 and score 7  Final Pulse Ox/HR 90% and 88/min and dyspnea of 7/10 91% a JR 88 ad scpre 2  Desaturated </= 88% no no  Desaturated <= 3% points Yes, dropped 7 points Yes, dooped 4 points  Got Tachycardic >/= 90/min yes yes  Miscellaneous comments Dyspneiuc, droo        PFT     Latest Ref Rng & Units 04/23/2023    7:54 AM 02/18/2023   11:05 AM 09/17/2022   10:11 AM 03/04/2020    1:00 PM 09/15/2019    5:13 PM 03/16/2019    8:42 AM 01/29/2018    2:31 PM  PFT Results  FVC-Pre L 2.88  2.77  2.78  3.07  2.92  2.91  2.97   FVC-Predicted Pre % 104  102  103  104  99  97  98   FVC-Post L       2.89   FVC-Predicted Post %       95   Pre FEV1/FVC % % 76  77  78  78  80  79  81   Post FEV1/FCV % %       83   FEV1-Pre L 2.18  2.12  2.18  2.39  2.34  2.29  2.40   FEV1-Predicted Pre % 106  106  108  108  106  102  105   FEV1-Post L       2.39   DLCO uncorrected ml/min/mmHg 9.99  9.27  8.80  12.94  12.84  12.32  14.80   DLCO UNC% % 50  47  45  63  63  60  54   DLCO corrected ml/min/mmHg 9.99  9.27  8.80  12.94  13.26  12.91    DLCO  COR %Predicted % 50  47  45  63  65  63    DLVA Predicted % 63  57  62  70  71  73  68        LAB RESULTS last 96 hours No results found.       has a past medical history of Allergic rhinitis, GERD (gastroesophageal reflux disease), Glaucoma, Hiatal hernia, Hyperlipidemia, Hypothyroidism, ILD (interstitial lung disease) (HCC), Lichen planopilaris, MVP (mitral valve prolapse), Restless legs, and Urticaria.   reports that she has never smoked. She has never used smokeless tobacco.  Past Surgical History:  Procedure Laterality Date   APPENDECTOMY     with TAH   CARDIAC CATHETERIZATION  1989   negative   COLONOSCOPY  2005   Dr Obie   COLONOSCOPY  01/2013   Dr kristie   esophageal dilation  07/04/2011   Dr Obie SIDE SIGMOIDOSCOPY  1999    Dr Obie   KNEE ARTHROSCOPY  2001, 2005   Dr Beverley   bilat   RIGHT/LEFT HEART CATH AND CORONARY ANGIOGRAPHY N/A 07/18/2023   Procedure: RIGHT/LEFT HEART CATH AND CORONARY ANGIOGRAPHY;  Surgeon: Mady Bruckner, MD;  Location: [facility] INVASIVE [facility] LAB;  Service: Cardiovascular;  Laterality: N/A;   TOTAL ABDOMINAL HYSTERECTOMY     for Endometriosis; no BSO, Dr Janyth   UPPER GI ENDOSCOPY  01/2013   Dr kristie   VIDEO BRONCHOSCOPY Bilateral 02/05/2019   Procedure: VIDEO BRONCHOSCOPY WITH FLUORO;  Surgeon: Geronimo Amel, MD;  Location[facility] ENDOSCOPY;  Service: Endoscopy;  Laterality: Bilateral;    Allergies  Allergen Reactions   Caffeine     palpitations   Chlorpheniramine-Pseudoeph     palpitations   Maxifed     palpitations   Other Other (See Comments)   Pirfenidone  Nausea Only    Esbriet  Causes nausea and weakness    Immunization History  Administered Date(s) Administered   Fluad Quad(high Dose 65+) 11/15/2020, 11/14/2021, 11/23/2022   INFLUENZA, HIGH DOSE SEASONAL PF 10/27/2012, 11/05/2013, 10/01/2017, 09/11/2018, 11/23/2019   Influenza Split 11/07/2010   Influenza Whole 10/30/2009, 11/27/2011   Influenza,inj,quad, With  Preservative 10/08/2016   Influenza-Unspecified 11/24/2014, 10/07/2015, 10/08/2016   PFIZER(Purple Top)SARS-COV-2 Vaccination 03/14/2019, 04/06/2019, 10/27/2019   Pneumococcal Conjugate-13 10/07/2015   Pneumococcal Polysaccharide-23 07/02/2017   Respiratory Syncytial Virus Vaccine ,Recomb Aduvanted(Arexvy ) 12/27/2021   Tdap 02/11/2012   Zoster Recombinant(Shingrix) 10/01/2017, 12/03/2017   Zoster, Live 01/09/2012    Family History  Problem Relation Age of Onset   Lung cancer Father        Asbestos with mesothelioma   Heart disease Paternal Grandmother        ? etiology   Diabetes Paternal Grandmother    Diabetes Brother    Uterine cancer Paternal Aunt    Stroke Paternal Grandfather        > 55   Heart attack Brother 46   Allergies Sister    Aneurysm Mother        congenital   Stroke Brother        two strokes   Barrett's esophagus Brother      Current Outpatient Medications:    aspirin  EC 81 MG tablet, Take 1 tablet (81 mg total) by mouth daily., Disp: 90 tablet, Rfl: 3   carboxymethylcellulose 1 % ophthalmic solution, Apply 1 drop to eye as needed (dry eyes)., Disp: , Rfl:    cholecalciferol (VITAMIN D3) 25 MCG (1000 UT) tablet, Take 1,000 Units by mouth daily. (  Patient taking differently: Take 2,000 Units by mouth daily.), Disp: , Rfl:    clobetasol  (TEMOVATE ) 0.05 % external solution, Apply 1 Application topically 2 (two) times daily., Disp: , Rfl:    Cyanocobalamin  (VITAMIN B-12 PO), Take 2,500 mcg by mouth daily., Disp: , Rfl:    fluticasone  (FLONASE) 50 MCG/ACT nasal spray, Place 1 spray into both nostrils daily., Disp: , Rfl:    hydroxychloroquine  (PLAQUENIL ) 200 MG tablet, Take 1 tablet by mouth twice a day, Disp: 60 tablet, Rfl: 9   loratadine (CLARITIN) 10 MG tablet, Take 10 mg by mouth daily., Disp: , Rfl:    Magnesium  Oxide 250 MG TABS, Take 500 mg by mouth daily., Disp: , Rfl:    Nintedanib (OFEV ) 100 MG CAPS, Take 1 capsule (100 mg total) by mouth 2 (two) times  daily., Disp: 180 capsule, Rfl: 1   pravastatin  (PRAVACHOL ) 40 MG tablet, TAKE 1 TABLET BY MOUTH DAILY GENERIC EQUIVALENT FOR PRAVACHOL , Disp: 90 tablet, Rfl: 2   sodium chloride  (BRONCHO SALINE ) inhaler solution, Take 1 spray by nebulization at bedtime., Disp: , Rfl:    SYNTHROID  88 MCG tablet, TAKE 1 TABLET DAILY 30 MINUTES BEFORE BREAKFAST 6 DAYS A WEEK AND 1/2 TABLET ONE DAY A WEEK, Disp: , Rfl:       Objective:   Vitals:   09/16/23 0943  BP: 118/62  Pulse: 80  SpO2: 95%  Weight: 131 lb 9.6 oz (59.7 kg)  Height: 5' 5 (1.651 m)    Estimated body mass index is 21.9 kg/m as calculated from the following:   Height as of this encounter: 5' 5 (1.651 m).   Weight as of this encounter: 131 lb 9.6 oz (59.7 kg).  @WEIGHTCHANGE @  [employer]   09/16/23 0943  Weight: 131 lb 9.6 oz (59.7 kg)     Physical Exam   General: No distress. Looks well O2 at rest: no Cane present: no Sitting in wheel chair: no Frail: no Obese: no Neuro: Alert and Oriented x 3. GCS 15. Speech normal Psych: Pleasant Resp:  Barrel Chest - no.  Wheeze - no, Crackles - no, No overt respiratory distress CVS: Normal heart sounds. Murmurs - no Ext: Stigmata of Connective Tissue Disease - no HEENT: Normal upper airway. PEERL +. No post nasal drip        Assessment/     Assessment & Plan IPF (idiopathic pulmonary fibrosis) (HCC)  Encounter for therapeutic drug monitoring    PLAN Patient Instructions  IPF (idiopathic pulmonary fibrosis) (HCC) Therapeutic DRug Monitoring   - Clinically this appears stable - now on ofev  100mg  low dose protocol since early aug 2025; s - currently taking once a day and tolerating it well  Plan -check LFT 09/16/2023 - increase ofev  to 100mg  twice daily with food  - in few weeks if tolerating well convert samples to prescription drug - continue daily exercise at [employer] with o2) - do spirometry and dlco in 10 weeks   Insomnia, unspecified type  -chronic many year  -Do not think this is related to study drug from past or ofev   Plan - PER PCP    Follow-up - 10 weeks standard of care visit - 30 min visit after PFT    FOLLOWUP    Return in about 10 weeks (around 11/25/2023) for 30 min visit, after Spiro and DLCO, Face to Face Visit, with Dr Geronimo.    SIGNATURE    Dr. Dorethia Geronimo, M.D., F.C.C.P,  Pulmonary and Critical Care Medicine Staff  Physician, [facility] Center Director - Interstitial Lung Disease  Program  Pulmonary Fibrosis [facility] at [facility] [place], [place], [address]  Pager: [phone], If no answer or between  15:00h - 7:00h: call [phone] Telephone: [phone]  12:31 PM 09/16/2023

## 2023-09-16 NOTE — Patient Instructions (Addendum)
 IPF (idiopathic pulmonary fibrosis) (HCC) Therapeutic DRug Monitoring   - Clinically this appears stable - now on ofev  100mg  low dose protocol since early aug 2025; s - currently taking once a day and tolerating it well  Plan -check LFT 09/16/2023 - increase ofev  to 100mg  twice daily with food  - in few weeks if tolerating well convert samples to prescription drug - continue daily exercise at [employer] with o2) - do spirometry and dlco in 10 weeks   Insomnia, unspecified type -chronic many year  -Do not think this is related to study drug from past or ofev   Plan - PER PCP    Follow-up - 10 weeks standard of care visit - 30 min visit after PFT

## 2023-09-25 ENCOUNTER — Ambulatory Visit: Admitting: Cardiology

## 2023-09-26 ENCOUNTER — Ambulatory Visit: Payer: Self-pay | Admitting: Internal Medicine

## 2023-09-27 ENCOUNTER — Ambulatory Visit: Admitting: Cardiology

## 2023-10-07 DIAGNOSIS — D692 Other nonthrombocytopenic purpura: Secondary | ICD-10-CM | POA: Diagnosis not present

## 2023-10-07 DIAGNOSIS — L720 Epidermal cyst: Secondary | ICD-10-CM | POA: Diagnosis not present

## 2023-10-09 ENCOUNTER — Other Ambulatory Visit (INDEPENDENT_AMBULATORY_CARE_PROVIDER_SITE_OTHER)

## 2023-10-09 ENCOUNTER — Ambulatory Visit: Payer: Self-pay | Admitting: Internal Medicine

## 2023-10-09 DIAGNOSIS — E039 Hypothyroidism, unspecified: Secondary | ICD-10-CM

## 2023-10-09 DIAGNOSIS — E559 Vitamin D deficiency, unspecified: Secondary | ICD-10-CM | POA: Insufficient documentation

## 2023-10-09 LAB — VITAMIN D 25 HYDROXY (VIT D DEFICIENCY, FRACTURES): VITD: 36.9 ng/mL (ref 30.00–100.00)

## 2023-10-09 LAB — TSH: TSH: 3.03 u[IU]/mL (ref 0.35–5.50)

## 2023-10-09 NOTE — Addendum Note (Signed)
 Addended by: GEOFM GLADE PARAS on: 10/09/2023 12:54 PM   Modules accepted: Orders

## 2023-10-24 ENCOUNTER — Other Ambulatory Visit: Payer: Self-pay | Admitting: Gastroenterology

## 2023-10-24 DIAGNOSIS — R1011 Right upper quadrant pain: Secondary | ICD-10-CM

## 2023-10-28 ENCOUNTER — Ambulatory Visit
Admission: RE | Admit: 2023-10-28 | Discharge: 2023-10-28 | Disposition: A | Discharge status: Home / Self Care | Source: Ambulatory Visit | Attending: Gastroenterology | Admitting: Gastroenterology

## 2023-10-28 DIAGNOSIS — R1011 Right upper quadrant pain: Secondary | ICD-10-CM | POA: Diagnosis not present

## 2023-10-30 ENCOUNTER — Other Ambulatory Visit (HOSPITAL_COMMUNITY): Payer: Self-pay | Admitting: Gastroenterology

## 2023-10-30 DIAGNOSIS — R1011 Right upper quadrant pain: Secondary | ICD-10-CM

## 2023-11-07 ENCOUNTER — Encounter (HOSPITAL_COMMUNITY)
Admission: RE | Admit: 2023-11-07 | Discharge: 2023-11-07 | Disposition: A | Discharge status: Home / Self Care | Source: Ambulatory Visit | Attending: Gastroenterology | Admitting: Gastroenterology

## 2023-11-07 DIAGNOSIS — R1011 Right upper quadrant pain: Secondary | ICD-10-CM | POA: Insufficient documentation

## 2023-11-07 MED ORDER — TECHNETIUM TC 99M MEBROFENIN IV KIT
5.0000 | PACK | Freq: Once | INTRAVENOUS | Status: AC
  Administered 2023-11-07: 5.4 via INTRAVENOUS

## 2023-11-12 ENCOUNTER — Other Ambulatory Visit: Payer: Self-pay | Admitting: Internal Medicine

## 2023-11-12 DIAGNOSIS — Z1231 Encounter for screening mammogram for malignant neoplasm of breast: Secondary | ICD-10-CM

## 2023-11-12 DIAGNOSIS — R1011 Right upper quadrant pain: Secondary | ICD-10-CM | POA: Diagnosis not present

## 2023-11-13 ENCOUNTER — Ambulatory Visit
Admission: RE | Admit: 2023-11-13 | Discharge: 2023-11-13 | Disposition: A | Discharge status: Home / Self Care | Source: Ambulatory Visit | Attending: Internal Medicine | Admitting: Internal Medicine

## 2023-11-13 DIAGNOSIS — Z1231 Encounter for screening mammogram for malignant neoplasm of breast: Secondary | ICD-10-CM | POA: Diagnosis not present

## 2023-11-14 DIAGNOSIS — M4310 Spondylolisthesis, site unspecified: Secondary | ICD-10-CM | POA: Diagnosis not present

## 2023-11-26 NOTE — Progress Notes (Unsigned)
 19: Dr Shelah: Elizabeth Espinoza is a 83 year old never smoker with a history of allergic rhinitis, GERD, hypothyroidism, MVP.  She is been seen in our office in the past by Dr. Corrie for restless leg syndrome-she still treated for this with Requip . On allegra, nexium  prn.   She is referred today for dyspnea.  She reports that she has had an exercise routine where she walked w her husband 2 miles, exercises at the [facility], for several years. She has noticed more SOB, especially with mild hills. Sometimes now has to stop briefly to complete the walk. Occasional chest tightness, no wheeze. No overt CP. She reports daily cough, often in the am, sometimes clear mucous. She is on allegra. She had a walking stress test 09/2014 > reassuring ECG w exercise. Weight has been stable. Her last TSH was 06/25/17, 0.73.   Review of her notes shows hx possible GGI on prior CT 2014.   ROV 02/26/18 [name] --83 year old never smoker with a history of mitral valve prolapse whom I saw earlier this year for exertional dyspnea and chronic cough.  Pulmonary function testing consistent with restriction.  She has GERD and allergic rhinitis both of which could be contributing to her chronic cough.  I performed a high-resolution CT scan of the chest on 1/20 and reviewed today, this shows some patchy confluent subpleural reticular disease and groundglass attenuation with some minimal traction bronchiectasis and no frank honeycomb change.  This reflects a progression compared with 2014 and is an NSIP pattern.  Walking oximetry at her last visit did not show any exertional desaturation. She is still coughing, is using fexofenadine, takes nexium  qod.   [place], had pesticide exposure.   ROV 08/26/2018 [name] --follow-up visit for 83 year old woman with a history of mitral valve prolapse, chronic cough, dyspnea with restrictive lung disease noted on pulmonary function testing.  High Res CT scan of the chest shows some mild  interstitial disease.  She was also having cough - better with flonase; still on nexium , allegra. Her exertional tolerance is improved, she has been exercising more. She remains on Requip  for RLS.   We performed autoimmune labs last time, ANA positive at low titer (1: 80), SSA and SSB negative, RNP negative, RF negative, CCP negative, Anti-Smith negative, anti-SCL negative, DS DNA negative, anti-Jo negative, aldolase negative. She hasn't been seen in ILD clinic yet.    OV 10/08/2018  Subjective:  Patient ID: Elizabeth Espinoza, female , DOB: June 26, 1940 , age 59 y.o. , MRN: [id number] , ADDRESS: [address] [place] [address]   10/08/2018 -   Chief Complaint  Patient presents with   Interstitial Lung Disease    Breathing the same as it was during August 2020 office visit with Dr. Shelah     HPI Elizabeth Espinoza 83 y.o. -has been referred to the interstitial lung disease clinic because of findings of interstitial lung disease.  History is gathered from talking to her, review of Dr. Lamar Sabal notes and also the integrated ILD questionnaire.  Briefly, she tells me that she was working out at [employer] and would just notice occasional dyspnea but she really did not compare it with other people.  Then in October 2019 she started walking 3 miles daily except on the days it rains with a husband.  During this time she noticed that she was falling really behind because of shortness of breath and exertional fatigue.  This resulted in subsequent evaluation  all documented above.  Findings of interstitial lung disease with subpleural reticulation suggestive of an alternative diagnosis.  Autoimmune profile essentially negative other than trace positive ANA.  She tells me that her significant major problem is exertional dyspnea when walking stairs or walking several miles.  She did not desaturate in our office several months ago.  She does not know if she desaturates when she exerts walking 3 miles.   Pulmonary function test earlier this year is just isolated low DLCO.  She also has like a cough.  Overall since the onset of the symptoms by exercising herself more and conditioning she is somewhat better.  She did see Dr. Redell Shallow in May 2020 and in June 2020 had a coronary calcium CT which appears to have no calcium deposits.   [facility] Integrated Comprehensive ILD Questionnaire  Symptoms:  -Dyspnea started suddenly and since it started it is better.  She says it is been present for years although she did tell me that it is only there since October 2019 when she noticed that.  Severity is listed below.  She does have a cough almost 1 year.  Since it started it is better.  It is mostly in the morning.  She does bring up some phlegm.  Early on in the morning it is green or yellow.  Since it started it is the same/better.  There is no wheezing.  She does have some chest tightness with this when she walks.  It is relieved by rest.  Cardiac work-up in June 2020 showed no calcium deposits.   Past Medical History : Positive for chronic longstanding acid reflux disease and thyroid  disease..  In addition CT scan from January 2020 shows hiatal hernia that is small..  This presence of  sclerosis in the bony structures in January 2020.-Primary care physician has been sent a message today.  There is no asthma or COPD or heart failure rheumatoid arthritis or collagen vascular disease.  She does have GERD and hiatal hernia for several years to decades.  No sleep apnea.  No blood clots.  No hepatitis.  No tuberculosis.  No pleurisy.  , ANA positive at low titer (1: 80), SSA and SSB negative, RNP negative, RF negative, CCP negative, Anti-Smith negative, anti-SCL negative, DS DNA negative, anti-Jo negative, aldolase negative. She hasn't been seen in ILD clinic yet.    ROS:  -She does have fatigue for the last several years.  She does have some back and hip issues.  She does have dry eyes.  She does have like some  dysphagia.  There is presence of hiatal hernia-there is a small.  Acid reflux for several decades.  She also reports presence of nonspecific rash   FAMILY HISTORY of LUNG DISEASE: * -Her father died of mesothelioma in 71 83/1984 at the age of 88 otherwise no lung disease.   EXPOSURE HISTORY:   -When she was 17 she smokes cigarettes but otherwise no cigarette or tobacco use or electronic cigarette.  Never smoked marijuana.  No cocaine use no intravenous drug use.   HOME and HOBBY DETAILS :  -Single[place]family home [place] setting for the last 16 years in a 82 year old home.  No mold or mildew exposure in the [facility] duct or CPAP mask or humidifier.  No mold or mildew in the bathroom.  No pet birds in the house.  No misting [place].  No feather pillows no feather duvet.  No musical instruments.  She does some occasional gardening which she likes.  She does do some fine-needle work.   OCCUPATIONAL HISTORY (122 questions) :  = Essentially negative except for the fact when she was a child she did some tobacco growing.  She has done home gardening for 50 years.    PULMONARY TOXICITY HISTORY (27 items):  denies  Results for HARMANI, NETO (MRN [id number]) as of 11/07/2018 11:20  Ref. Range 02/26/2018 12:17  Anti Nuclear Antibody (ANA) Latest Ref Range: NEGATIVE  POSITIVE (A)  ANA Pattern 1 Unknown Nuclear, Speckled (A)  ANA Titer 1 Latest Units: titer 1:80 (H)  Anti JO-1 Latest Ref Range: 0.0 - 0.9 AI <0.2  Cyclic Citrullin Peptide Ab Latest Units: UNITS <16  ds DNA Ab Latest Units: IU/mL <1  RA Latex Turbid. Latest Ref Range: <14 IU/mL <14  ENA SM Ab Ser-aCnc Latest Ref Range: <1.0 NEG AI <1.0 NEG  Ribonucleic Protein(ENA) Antibody, IgG Latest Ref Range: <1.0 NEG AI <1.0 NEG  SSA (Ro) (ENA) Antibody, IgG Latest Ref Range: <1.0 NEG AI <1.0 NEG  SSB (La) (ENA) Antibody, IgG Latest Ref Range: <1.0 NEG AI <1.0 NEG  Scleroderma (Scl-70) (ENA) Antibody, IgG Latest Ref Range: <1.0 NEG AI <1.0 NEG    Results for SHERLEEN, PANGBORN (MRN [id number]) as of 11/07/2018 11:20  Ref. Range 01/29/2018 14:31  FVC-Pre Latest Units: L 2.97  FVC-%Pred-Pre Latest Units: % 98  Results for RIELYN, KRUPINSKI (MRN [id number]) as of 11/07/2018 11:20  Ref. Range 01/29/2018 14:31  DLCO unc Latest Units: ml/min/mmHg 14.80  DLCO unc % pred Latest Units: % 54    OV 11/07/2018  Subjective:  Patient ID: Elizabeth Espinoza, female , DOB: [birth year] , age 83 y.o. , MRN: [id number] , ADDRESS: [address] [place] [address]   11/07/2018 -   Chief Complaint  Patient presents with   Follow-up    Patient reports that she has sob with any exertion.    Follow-up interstitial lung disease  HPI Elizabeth Espinoza 83 y.o. -last seen September 2020.  After that she was supposed to have follow-up high-resolution CT chest and spirometry DLCO.  For some reason the spirometry DLCO is not done.  In the interim her symptoms remain the same as shown by the symptom score below.  She does note when she does heavy exertion such as climbing stairs or long uphill walks her pulse ox drops to 84% but quickly regains.  She is not interested in portable oxygen .  She had a repeat high-resolution CT chest in September 2020 and when compared to January 2020 there is no significant change.  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  I personally visualized the film.  There is diffuse bilateral subpleural reticulation and some traction bronchiectasis.  I myself would say it may be this is indeterminate for UIP.  But there is some minimal air trapping and she is done some gardening work.  There is no upper zonal predominance that would fit in with hypersensitive pneumonitis.  She has incidental findings of bony sclerosis that is again repeated in the CT scan.  Her primary care physician Glade Hope evaluated her.  I reviewed the note.  She is been referred to Dr. Norleen Kidney in hematology who she is seeing November 12, 2018.  She is very confused about the fact that she has a bony sclerosis problem for which she is being referred to a blood doctor [hematologist] and she thought she is having a blood  test that was ordered by me.  I reviewed the chart and clarified this concepts   Ct Chest High Resolution  Result Date: 11/05/2018 CLINICAL DATA:  83 year old female with history of interstitial lung disease. Increased shortness of breath and cough over the past year. EXAM: CT CHEST WITHOUT CONTRAST TECHNIQUE: Multidetector CT imaging of the chest was performed following the standard protocol without intravenous contrast. High resolution imaging of the lungs, as well as inspiratory and expiratory imaging, was performed. COMPARISON:  High-resolution chest CT 02/10/2018. FINDINGS: Cardiovascular: Heart size is normal. There is no significant pericardial fluid, thickening or pericardial calcification. There is aortic atherosclerosis, as well as atherosclerosis of the great vessels of the mediastinum and the coronary arteries, including calcified atherosclerotic plaque in the left anterior descending and right coronary arteries. Mild calcifications of the mitral annulus. Mediastinum/Nodes: No pathologically enlarged mediastinal or hilar lymph nodes. Please note that accurate exclusion of hilar adenopathy is limited on noncontrast CT scans. Esophagus is unremarkable in appearance. No axillary lymphadenopathy. Lungs/Pleura: High-resolution images demonstrates some patchy areas of peripheral predominant septal thickening and subpleural reticulation, with mild cylindrical bronchiectasis and peripheral bronchiolectasis. No frank honeycombing confidently identified at this time. These findings have no definitive craniocaudal gradient. In the periphery of the mid to upper lungs there also some plaque-like areas of apparent pleuroparenchymal scarring and volume loss. Inspiratory and  expiratory imaging demonstrates minimal air trapping indicative of very mild small airways disease. Overall, these imaging findings appear stable compared to the prior study. Upper Abdomen: Aortic atherosclerosis. Musculoskeletal: Mild diffuse sclerosis throughout the visualized axial and appendicular skeleton, similar to prior examinations. There are no definite focal aggressive appearing lytic or blastic lesions noted in the visualized portions of the skeleton. IMPRESSION: 1. 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 2. Aortic atherosclerosis, in addition to 2 vessel coronary artery disease. Assessment for potential risk factor modification, dietary therapy or pharmacologic therapy may be warranted, if clinically indicated. 3. Persistent mild diffuse sclerosis throughout the visualized osseous structures without discrete aggressive appearing osseous lesions. Clinical correlation for signs and symptoms of potential infiltrative process such as myelofibrosis is suggested. Aortic Atherosclerosis (ICD10-I70.0). Electronically Signed   By: Toribio Aye M.D.   On: 11/05/2018 14:36     OV 12/22/2018  Subjective:  Patient ID: Elizabeth Espinoza, female , DOB: 01-16-1941 , age 72 y.o. , MRN: [id number] , ADDRESS: [address] [place] [address]   12/22/2018 -   Chief Complaint  Patient presents with   Follow-up    Pt states she has been doing good since last visit. Pt is still coughing and will get up clear mucus in the morning.   Follow-up interstitial lung disease with CT scan October 2020 being indeterminate versus alternate diagnosis.  History of gardening.  Trace autoimmune ANA  positive.  Mild progression since 2014  HPI DILLIE BURANDT 83 y.o. -returns for follow-up.  She presents with her husband who I am meeting for the first time.  In the interim she met Dr. Kerrin thoracic surgeon for surgical lung biopsy.  Her husband also met with him.  I reviewed the note.  She tells me that given the morbidity with surgical lung biopsy she wants to undergo bronchoscopy with  lavage and transbronchial biopsy first.  She understands the inherent limitations of these procedure in terms of diagnosis.  But she wants to take the lower risk profile.  Overall she feels stable.  Her symptom score is improved compared to the past.  Her walking desaturation test shows exaggerated drop in pulse ox but this was done with her wearing the mask.  She did not feel any dyspnea.  In terms of her bony sclerosis she has seen Dr. Federico at [facility].  I reviewed the note.  He has reassured her.  Risks of pneumothorax, hemothorax, sedation/anesthesia complications such as cardiac or respiratory arrest or hypotension, stroke and bleeding all explained. Benefits of diagnosis but limitations of non-diagnosis also explained. Patient verbalized understanding and wished to proceed.   They want to have the bronchoscopy after the holidays of Christmas and new year.  This is because their house is undergoing remodeling currently.       OV 03/16/2019  Subjective:  Patient ID: Elizabeth Espinoza, female , DOB: 1940/11/06 , age 30 y.o. , MRN: [id number] , ADDRESS: [address] [place] [address]   03/16/2019 -   Chief Complaint  Patient presents with   Follow-up    PFT performed today.  Pt states she has been doing okay since last visit and states her breathing is about the same.     Finally able to review esults of [facility] send out test to [place] . Date of test is 02/05/2019. 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 -returns for follow-up to discuss bronchoscopy  and lavage results.  In the interim no new respiratory issues.  They are all stable.  However she is having significant musculoskeletal issues with shoulder pain.  Serology from a year ago was negative.  They wanted to know about relatedness.  I told him it is probably not related.  We went over the bronchoscopy lavage results which showed some dominance of neutrophils consistent with UIP.  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   We had a long discussion about the benefits, risks and limitations of antifibrotic therapy.  We also discussed the choice between pirfenidone  and nintedanib.       Results for KAILEENA, OBI (MRN [id number]) as of 03/16/2019 11:12  Ref. Range 01/29/2018 14:31 03/16/2019 08:42  FVC-Pre Latest Units: L 2.97 2.91  FVC-%Pred-Pre Latest Units: % 98 97   Results for KATRINKA, HERBISON (MRN [id number]) as of 03/16/2019 11:12  Ref. Range 01/29/2018 14:31 03/16/2019 08:42  DLCO unc Latest Units: ml/min/mmHg 14.80 12.32  DLCO unc % pred Latest Units: % 54 60     OV 06/11/2019 - telephine visit. Patient identified with 2 pHI, risks, benefit, limitations of tele visit explained  Subjective:  Patient ID: Elizabeth Espinoza, female , DOB: 03-12-1940 , age 59 y.o. , MRN: [id number] , ADDRESS: [address] [place] [address] ate of test is 02/05/2019. 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 -in this telephone visit patient is now on pirfenidone .  She says she is on pirfenidone  since mid April 2021.  She is on 3 pills 3 times a day.  She spacing them 4 hours apart  but having some intermittent nausea every few days.  She did have an urticaria but that was before she started pirfenidone  was related to Covid vaccine that is now  resolved.  She is asking about exercising at the [facility] and Covid precautions.  I advised her because she is vaccinated that the risk is low but not 0 and to take adequate precautions as tolerated.  She had liver function test and this is normal.  Her next appointment for pulmonary function test is in mid July 2021.  Recommended she make a face-to-face visit with me at that time.   OV 08/21/2019   Subjective:  Patient ID: Elizabeth Espinoza, female , DOB: [birth year], age 25 y.o. years. , MRN: [id number],  ADDRESS: [address] [place] [address] PCP  Geofm Glade PARAS, MD Providers : Treatment Team:  Attending Provider: Geronimo Amel, MD  Type of visit: Telephone Circumstance: COVID-19 national emergency Identification of patient Elizabeth Espinoza - 2 person identifier Risks: Risks, benefits, limitations of telephone visit explained Patient location: home This provider location: [place] [facility]      Chief Complaint  Patient presents with   Follow-up    PFT 7/27--c/o sob with stairs and throat clearing mainly in the morning. stopped Esbriet  on 08/12/2019.   Follow-up interstitial lung disease with CT scan October 2020 being indeterminate versus alternate diagnosis.  History of gardening.  Trace autoimmune ANA positive.  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 -presents for this telephone visit for IPF.  On this telephone visit she was identified with 2 person identifier.  Risks, benefits and limitations of telephone visit explained.  After last visit her GI symptoms worsen.  She did see Dr. Ezzie Sous gastroenterologist.  She underwent endoscopy.  Dr. Sous did send the results to me I do not have this.  I remember Dr. Sous calling me and discussing patient's GI  side effects.  These were from pirfenidone  so on August 12, 2019 based on my advise she start pirfenidone .  Since then her symptoms of GI nature have resolved.  Her respiratory symptoms continue to be stable.  These are all documented below.  At this point in time she feels that her quality of life is very important.  She does not want to go through the kind of GI side effect she went through with pirfenidone .  She would rather deal with the disease.  She is aware that nintedanib is a standard of care therapeutic option.  She is also aware after discussion the clinical trials as a care option particularly in the past to discovering new therapies that do not have this GI side effects that pirfenidone  poses.  She had a lot of questions about Covid vaccine.  She is fully vaccinated.  She is worried about her grandson was refusing the vaccine.      OV 09/29/2019   Subjective:  Patient ID: Elizabeth Espinoza, female , DOB: Jun 06, 1940, age 80 y.o. years. , MRN: [id number],  ADDRESS: [address] [place] [address] PCP  Geofm Glade PARAS, MD Providers : Treatment Team:  Attending Provider: Geronimo Amel, MD   Chief Complaint  Patient presents with   Follow-up    LFT today?  pulmonary rehab or exercises.  inhaler trial, felt  better after Advair.   Follow-up interstitial lung disease/IPF with CT scan October 2020 being indeterminate versus alternate diagnosis.  History of gardening.  Trace autoimmune ANA positive.  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 Date of test is 02/05/2019. 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  -Date of giving diagnosis is March 16, 2019  - MDD 05/05/19  - esbriet  since 05/06/19- stopped 08/12/19 due to severe GI side effect    HPI Elizabeth Espinoza 83 y.o. -    presesnts  With her husband for IPF followup.  Overall she is doing well.  She  is not taking pirfenidone  since July 2021.  After that she has gained weight.  All her GI symptoms have resolved.  She goes for daily walks.  She says when she reaches the top of the hill sometimes she desaturates to 88% but not always.  She monitors this closely.  She is asking for other exercises that can improve her endurance and lung function.  We discussed that exercises do not improve lung function.  She is willing to participate in pulmonary rehabilitation.  We discussed the alternative of taking nintedanib but because of the severe GI side effects that she had with Esbriet  she is nervous about starting nintedanib and does not want to.  I introduced her to the concept of clinical trials as a care option.  At this point in time she wants to process this information.  We did discuss the fact that IPF is a progressive disease and antifibrotic's are indicated early in the course of the disease.  She prefers to have supportive care approach.  She wants to focus on strengthening her overall physical shape.   OV 06/30/2021 -standard of care visit in this research patient.  Subjective:  Patient ID: Elizabeth Espinoza, female , DOB: [birth year] , age 81 y.o. , MRN: [id number] , ADDRESS: [address] [place] [address] PCP Geofm Glade PARAS, MD Patient Care Team: Geofm Glade PARAS, MD as PCP - General (Internal Medicine) Pietro Redell RAMAN, MD as Consulting Physician (Cardiology) Shelah Lamar RAMAN, MD as Consulting Physician (Pulmonary Disease) Fate Morna SAILOR, [facility] as Pharmacist (Pharmacist) Carlie Clark, MD as Consulting Physician (Otolaryngology) [facility] as Consulting Physician (Ophthalmology)  This Provider for this visit: Treatment Team:  Attending Provider: Geronimo Amel, MD    06/30/2021 -   Chief Complaint  Patient presents with   Follow-up    Pt recently had a CT of her sinuses and is here to discuss the results.  Pt states she has not been feeling well and  states she is still having problems with her sinuses. States her breathing has been doing okay.     HPI Elizabeth Espinoza 83 y.o. -on April 19, 2021 she did have some side effects from inhaled medical product.  After that we reduce the dose and slowly escalated back and then she was tolerating it fine.  On 05/22/2021 she was prescribed antibiotics for some sinus issues she did have week 8 study visit on 06/01/2021 and was doing okay.  But then on 06/20/2021 she started complaining of headache and sinus congestion and coughing and blowing yellowish mucus.  At this point in time she was taking inhaled treprostinil versus placebo investigational medical product at 4 times daily each time 12 puffs.  We told her to reduce  it to 9 puffs 4 times daily.  She tells me that perhaps she is marginally better after reducing the dose but she still having headaches.  She feels like her head is full particularly in the top part.  She also has significant cough.  She says during and after she takes the inhaled medical investigational product her cough score is 9 out of 10.  Rest of the day it is around 3 out of 10.  She coughs so much that she sometimes brings out mild yellow mucus that is like glue.  There is no diarrhea fatigue or weight loss or syncope or hemoptysis.  Definitely all the symptoms only after starting the research protocol.  She also some sore throat.  She has tried some remedial measures but these have not helped.  These include yogurt and ginger ale  For the dyspnea itself she feels stable.  She also tells me that she is able to walk a mile.  She is able to outplace her husband.  She feels her husband's declining health.  She says when she walks a mile she puts a finger probe after she rests and the pulse ox is 94%.  She is worried she might desaturate below 88%.  She does not have a forehead pulse oximeter.  She says the finger pulse ox meter does not stay on while she is walking.  I gave her a contact of the  patient support group leader to figure out a way to get a forehead probe.  Also showed her various finger proximal she can get that can stay on her finger while she exercises.  Told her the safety limit is greater than 88%.  Symptom scores below show that her symptoms are better than 2 years ago slightly.  This is from a respiratory standpoint with a cough is definitely worse after she went on the study.  And the headache is new.  She had a CT scan sinus that shows chronic findings.      OV 05/04/2022  Subjective:  Patient ID: Elizabeth Espinoza, female , DOB: 1940/08/23 , age 45 y.o. , MRN: [id number] , ADDRESS: [address] [place] [address] PCP Geofm Glade PARAS, MD Patient Care Team: Geofm Glade PARAS, MD as PCP - General (Internal Medicine) Pietro Redell RAMAN, MD as Consulting Physician (Cardiology) Shelah Lamar RAMAN, MD as Consulting Physician (Pulmonary Disease) Fate Morna SAILOR, [facility] as Pharmacist (Pharmacist) Carlie Clark, MD as Consulting Physician (Otolaryngology) [facility] as Consulting Physician (Ophthalmology)  This Provider for this visit: Treatment Team:  Attending Provider: Geronimo Amel, MD  05/04/2022 -   Chief Complaint  Patient presents with   Follow-up    F/u after researched      HPI Elizabeth CHRISTELLA Egelston 83 y.o. -returns for routine follow-up.  In the interim she was on inhaled treprostinil versus placebo but she did not tolerate it at all.  She had all the classic side effects of the renal drug.  We made multiple attempts in holiday and giving the drug back but she did not tolerate it at all and she finally quit.  She is out of the study now.  She did do 1 year follow-up and exited the study.  Currently she is on supportive care.  She walks on Monday through Friday 3 miles a day.  This is through [facility] and she has gotten the best walker award.  She does have some chest burning when she starts the walking but it  does get better  as she walks further.  She is not limited by dyspnea.  She checks her pulse ox after the walk a few seconds later and the pulse ox is 93% 94% at the lowest.  I did tell her to monitor it while she walks.  In terms with therapy she is not interested in standard of care antifibrotic therapy because of side effect profile.  She tried pirfenidone  in the past.  She is also been intolerant to treprostinil.  She is absolutely not interested in nintedanib.  We talked about doing the revert study sponsored by [facility]  Is an oral pill.  She read the consent form which we gave it to her some weeks ago.  She does not want to do the study because of side effect profile.  There is another study called MOONSCAPE by [employer].  Is an injection study.  Current patients are tolerating the study well so far.  She might be interested in this..  Will probably aim to recruit and Q3  2024.    OV 09/17/2022  Subjective:  Patient ID: Elizabeth Espinoza, female , DOB: 11-01-1940 , age 6 y.o. , MRN: [id number] , ADDRESS: [address] [place] [address] PCP Geofm Glade PARAS, MD Patient Care Team: Geofm Glade PARAS, MD as PCP - General (Internal Medicine) Pietro Redell RAMAN, MD as PCP - Cardiology (Cardiology) Pietro Redell RAMAN, MD as Consulting Physician (Cardiology) Shelah Lamar RAMAN, MD as Consulting Physician (Pulmonary Disease) Fate Morna SAILOR, [facility] (Inactive) as Pharmacist (Pharmacist) Carlie Clark, MD as Consulting Physician (Otolaryngology) [facility] as Consulting Physician (Ophthalmology)  This Provider for this visit: Treatment Team:  Attending Provider: Geronimo Amel, MD    09/17/2022 -   Chief Complaint  Patient presents with   Follow-up    Breathing is about the same. She denies any new co's. She is walking 3 miles daily.    HPI AYSIA LOWDER 83 y.o. -presents for follow-up.  She is currently on supportive care.  She started off the visit by saying she was doing  stable but later she did admit that when she does a 3 mile walk sometimes she gets dizziness but she is not checking her pulse ox but she is still able to finish her 3 miles at the same time.  She also when she does vacuuming she gets quite dyspneic.  No new medical problems other than the fact her leg and feet have chronic numbness and associated with restless legs. Interim Health status: No new complaints No new medical problems. No new surgeries. No ER visits. No Urgent care visits. No changes to medications.  Symptom scores and x-rays hypoxemia test remained stable but a pulmonary function test today shows a drop in DLCO.  We discussed this.  In terms of treatment she is only on supportive care after having failed antifibrotic's and previous intolerance to inhaled treprostinil study drug.  She is looking at the injection based therapy via [employer] protocol.       OV 10/23/2022  Subjective:  Patient ID: Elizabeth Espinoza, female , DOB: [birth year] , age 81 y.o. , MRN: [id number] , ADDRESS: [address] [place] [address] PCP Geofm Glade PARAS, MD Patient Care Team: Geofm Glade PARAS, MD as PCP - General (Internal Medicine) Pietro Redell RAMAN, MD as PCP - Cardiology (Cardiology) Pietro Redell RAMAN, MD as Consulting Physician (Cardiology) Shelah Lamar RAMAN, MD as Consulting Physician (Pulmonary Disease) Fate Morna SAILOR, [facility] (Inactive) as Pharmacist (  Pharmacist) Carlie Clark, MD as Consulting Physician (Otolaryngology) [facility] as Consulting Physician (Ophthalmology)  This Provider for this visit: Treatment Team:  Attending Provider: Geronimo Amel, MD    10/23/2022 -   Chief Complaint  Patient presents with   Follow-up    Ct scan f/u      HPI SALLIE STARON 83 y.o. -returns for follow-up.  She continues a 3 mile walks.  She feels that the exertional shortness of breath that she was having during the 3 mile walk is somewhat better.  Reviewing of the  symptom score tells me that she has been stable overall and it is hard to discern.  The big concern at last visit was a drop in pulmonary function test especially DLCO.  There was concern about pulmonary hypertension versus pulmonary fibrosis worsening so so we did BNP and this was elevated increasing concern for pulmonary hypertension but the echo shows only grade 1 diastolic dysfunction normal RV.  Were also concerned about worsening pulmonary fibrosis we did a high-resolution CT chest and radiology feels it has been stable for 2 years.  Stability in lung function is confirmed by stability and symptom score and also exercise hypoxemia test.  The big change she has there is a pulmonary function test.  This could be a red herring based on variations.  Nevertheless the BNP was high.  Therefore we will recheck this today.  She did mention to me that 7 or 10 days ago she did have an episode of palpitations while doing a 3 mile walk heart rate was 170 on the Fitbit and she had to stop.  No known atrial fibrillation diagnosis.  Same thing happened 2 months ago but she forgot to mention this to Dr. Pietro on her visit early August 2024.  I have reached out to him via electronic message.  In terms of her pulmonary fibrosis therapy she is interested in other therapies that have minimal side effect profile.  The approved and antifibrotic's all have side effect profile.  She is interested experimental protocol.  We went over VIXARELIMAB monoclonal antibody given every few weeks.  There are some interesting data with prurigo.  I showed her that in the side effect profile.  She is taken the consent form.  She is interested in this at this point.     CT Chest data from date: 09/20/22  Narrative & Impression  CLINICAL DATA:  83 year old female with history of interstitial lung disease. Follow-up study.   EXAM: CT CHEST WITHOUT CONTRAST   TECHNIQUE: Multidetector CT imaging of the chest was performed following  the standard protocol without intravenous contrast. High resolution imaging of the lungs, as well as inspiratory and expiratory imaging, was performed.   RADIATION DOSE REDUCTION: This exam was performed according to the departmental dose-optimization program which includes automated exposure control, adjustment of the mA and/or kV according to patient size and/or use of iterative reconstruction technique.   COMPARISON:  High-resolution chest CT 11/14/2020.   FINDINGS: Cardiovascular: Heart size is normal. There is no significant pericardial fluid, thickening or pericardial calcification. There is aortic atherosclerosis, as well as atherosclerosis of the great vessels of the mediastinum and the coronary arteries, including calcified atherosclerotic plaque in the left anterior descending and right coronary arteries. Mild calcification of the mitral annulus.   Mediastinum/Nodes: No pathologically enlarged mediastinal or hilar lymph nodes. Please note that accurate exclusion of hilar adenopathy is limited on noncontrast CT scans. Esophagus is unremarkable in appearance. No axillary  lymphadenopathy.   Lungs/Pleura: Widespread areas of peripheral predominant septal thickening, subpleural reticulation, scattered mild cylindrical bronchiectasis and peripheral bronchiolectasis are again noted throughout the lungs bilaterally. No definite honeycombing noted. No discernible craniocaudal gradient. Overall, these findings appear essentially unchanged compared to the prior study. Inspiratory and expiratory imaging is unremarkable. No acute consolidative airspace disease. No definite suspicious appearing pulmonary nodules or masses are noted. Extensive bilateral apical pleuroparenchymal thickening and architectural distortion, similar to the prior study.   Upper Abdomen: Aortic atherosclerosis.   Musculoskeletal: There are no aggressive appearing lytic or blastic lesions noted in the  visualized portions of the skeleton.   IMPRESSION: 1. 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 Given the long-term stability, an alternative diagnosis such as fibrotic phase nonspecific interstitial pneumonia (NSIP) should also be considered. 2. Aortic atherosclerosis, in addition to left anterior descending and right coronary artery disease. Please note that although the presence of coronary artery calcium documents the presence of coronary artery disease, the severity of this disease and any potential stenosis cannot be assessed on this non-gated CT examination. Assessment for potential risk factor modification, dietary therapy or pharmacologic therapy may be warranted, if clinically indicated.   Aortic Atherosclerosis (ICD10-I70.0).     Electronically Signed   By: Toribio Aye M.D.   On: 09/29/2022 12:00      Latest Reference Range & Units 09/17/22 12:05  Pro B Natriuretic peptide (BNP) 0.0 - 100.0 pg/mL 129.0 (H)  (H): Data is abnormally high   ECHO 09/18/22   IMPRESSIONS     1. Left ventricular ejection fraction, by estimation, is 55 to 60%. The  left ventricle has normal function. The left ventricle has no regional  wall motion abnormalities. Left ventricular diastolic parameters are  consistent with Grade I diastolic  dysfunction (impaired relaxation).   2. Right ventricular systolic function is normal. The right ventricular  size is normal.   3. Left atrial size was mildly dilated.   4. The mitral valve is normal in structure. Trivial mitral valve  regurgitation. No evidence of mitral stenosis.   5. The aortic valve is tricuspid. There is mild calcification of the  aortic valve. Aortic valve regurgitation is trivial. Aortic valve  sclerosis/calcification is present, without any evidence of aortic  stenosis.   6. The inferior vena  cava is normal in size with greater than 50%  respiratory variability, suggesting right atrial pressure of 3 mmHg.     OV 02/21/2023  Subjective:  Patient ID: Elizabeth Espinoza, female , DOB: [birth year] , age 64 y.o. , MRN: [id number] , ADDRESS: [address] [place] [address] PCP Geofm Glade PARAS, MD Patient Care Team: Geofm Glade PARAS, MD as PCP - General (Internal Medicine) Pietro Redell RAMAN, MD as PCP - Cardiology (Cardiology) Pietro Redell RAMAN, MD as Consulting Physician (Cardiology) Shelah Lamar RAMAN, MD as Consulting Physician (Pulmonary Disease) Fate Morna SAILOR, [facility] (Inactive) as Pharmacist (Pharmacist) Carlie Clark, MD as Consulting Physician (Otolaryngology) [facility] as Consulting Physician (Ophthalmology)  This Provider for this visit: Treatment Team:  Attending Provider: Geronimo Amel, MD    02/21/2023 -   Chief Complaint  Patient presents with   Follow-up    Pt states she still has a ongoing productive cough ( green phlegm ) states she is feeling stronger and less fatigue.     SABRA  HPI TAYTE CHILDERS 83 y.o. -  returns for follow-up.  At this follow-up visit she is admitting that she is desaturating with exertion.  She notices when she works out on her exercise bike she goes down to 89 or 88%.  She stopped going to the [facility] because of pulse oximeter is alarming when she gets to 88% and she is doing it easily.  She says at that time she does not necessarily feel short of breath although at later time point she did admit for shortness of breath but overall symptom questionnaire shows continued stability but she is definitely desaturating.  She continues to have tachycardia review of external records indicate she is having SVTs.  Cardiology tried Lopressor  but she did not tolerate.  She has a historical tolerance issues with many medications.  She did tolerate the oral pill versus placebo on P[employer]ant study but she did not tolerate standard of  care Esbriet .  She did not tolerate inhaled treprostinil versus placebo in a study which we think she got the actual study drug..  Today exercise hypoxemia test shows worsening she is desaturating to 88%.  This something she had not done with similar exertion in the past.  I had a frank conversation with her I did indicate to her that I am really concerned the disease is progressive.  Indicated to her that she should strongly consider trying standard of care nintedanib.  I went over the diarrhea side effects.  She is apprehensive of the side effect but also concerned about the $2000 co-pay cost.  I told her this is standard under the new inflation reduction act.  She is still processing the information.  I told her that the advent of exercise hypoxemia is a negative prognostic sign.  Told her to consider at least option B  which is to get into a clinical trial with injection monoclonal antibody Vixarelimab versus placebo.  So far all patients are tolerating it well.  Short of the side effect profile.  She already took the consent last time but did not read it.  We are giving her the consent again.  Told her she can add standard of care nintedanib once she starts the study.  Least preferred option is to continue supportive care.   For her quality of life:  - recommend portoabl o2 - test ono      OV 03/14/2023  Subjective:  Patient ID: Elizabeth Espinoza, female , DOB: 1940-10-05 , age 7 y.o. , MRN: [id number] , ADDRESS: [address] [place] [address] PCP Geofm Glade PARAS, MD Patient Care Team: Geofm Glade PARAS, MD as PCP - General (Internal Medicine) Pietro Redell RAMAN, MD as PCP - Cardiology (Cardiology) Pietro Redell RAMAN, MD as Consulting Physician (Cardiology) Shelah Lamar RAMAN, MD as Consulting Physician (Pulmonary Disease) Fate Morna SAILOR, [facility] (Inactive) as Pharmacist (Pharmacist) Carlie Clark, MD as Consulting Physician (Otolaryngology) [facility] as  Consulting Physician (Ophthalmology)  This Provider for this visit: Treatment Team:  Attending Provider: Geronimo Amel, MD    03/14/2023 -   Chief Complaint  Patient presents with   Follow-up    HPI GIAVONNI CIZEK 83 y.o. -presents for acute visit follow-up.  After I saw her last time she had acute bronchitis and was desaturating ended up in the ER last Thursday had to call an antibiotic and prednisone .  She now presents with her husband.  She says she is a lot better but only 50% better.  She still has otalgia sore throat chest being sore coughing  colored sinus drainage.  She is finished with her doxycycline  she still has a few days of prednisone  left.  She wants a repeat course of antibiotic.  She says that she still desaturates at home walking 50 feet.  Of note last time we prescribed portable oxygen  but it appears that she also got a oxygen  concentrator at home.  She had a overnight pulse oximetry study and in this there is no desaturations past 4-1/2 minutes.  She is slightly bradycardic but she is on Lopressor .  Therefore I did advise her that she could return her concentrator and just retain her portable oxygen  system.  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   OV 04/25/2023  Subjective:  Patient ID: Elizabeth Espinoza, female , DOB: Mar 31, 1940 , age 76 y.o. , MRN: [id number] , ADDRESS: [address] [place] [address] PCP Geofm Glade PARAS, MD Patient Care Team: Geofm Glade PARAS, MD as PCP - General (Internal Medicine) Pietro Redell RAMAN, MD as PCP - Cardiology (Cardiology) Pietro Redell RAMAN, MD as Consulting Physician (Cardiology) Shelah Lamar RAMAN, MD as Consulting Physician (Pulmonary Disease) Fate Morna SAILOR, [facility] (Inactive) as Pharmacist (Pharmacist) Carlie Clark, MD as Consulting  Physician (Otolaryngology) [facility] as Consulting Physician (Ophthalmology)  This Provider for this visit: Treatment Team:  Attending Provider: Geronimo Amel, MD    04/25/2023 -   Chief Complaint  Patient presents with   Follow-up    Breathing is doing well and she denies any co's today.     HPI JANIESHA DIEHL 83 y.o. -returns for follow-up.  She states since the sinusitis she has improved.  She feels she no longer needs portable oxygen .  At the [facility] her husband states that she is walking a little slower but she does not desaturate.  A technician there help monitor her pulse ox when she walked 2 miles and she did not desaturate below 90%.  She feels back to baseline.  Symptom score have improved.  Exercise hypoxemia test here is stable.  She wanted to discuss next step in therapies.  She called the office a few days ago wanting to see a research consent.  She then met with the research coordinator yesterday and took to consent form for short-term phase 2, 20-28 weeks oral drug studies.  When she first came into the office she said she was going to do the study at least one of them.  Then later she said she wanted to do approved antifibrotic nintedanib.  In the past she had tried pirfenidone  but failed and swore because of the side effect she will never take an approved antifibrotic.  We then discussed nintedanib explained to her this is standard of care.  Explained to her that patient should always consider standard of care therapies first.  Explained to her that there is a 10-year safety profile on the medication.  Explained to her monitoring standards.  She then agreed to do nintedanib but just as I wrote the instructions she changed her mind.  She and her husband expressed cost concerns.  We went over the inflation reduction act and her co-pay.  She reflected on this.  She then wanted to know if she could do the study given the fact she was stable and then take  approved antifibrotic's few to several weeks after  starting a research protocol.  She feels that she will benefit from 2 medications.  We went over therapeutic misconception.  Explained to her that the purpose of the study especially phase 2 is to improve society and help develop new medications.  She believes will get monitoring with the study.  I agreed with this.  At the end she is going to reflect on all this.  She is to research concerns to look at.  We are going to check with the sponsor if she can get the study drug and then start approved antifibrotic.  She will also reflect if she wants to take the approved antifibrotic first and then do the study when she is stable on it for a few months.   OV 06/25/2023  Subjective:  Patient ID: Elizabeth Espinoza, female , DOB: 08-14-1940 , age 26 y.o. , MRN: [id number] , ADDRESS: [address] [place] [address] PCP Geofm Glade PARAS, MD Patient Care Team: Geofm Glade PARAS, MD as PCP - General (Internal Medicine) Pietro Redell RAMAN, MD as PCP - Cardiology (Cardiology) Pietro Redell RAMAN, MD as Consulting Physician (Cardiology) Shelah Lamar RAMAN, MD as Consulting Physician (Pulmonary Disease) Fate Morna SAILOR, [facility] (Inactive) as Pharmacist (Pharmacist) Carlie Clark, MD as Consulting Physician (Otolaryngology) [facility] as Consulting Physician (Ophthalmology)  This Provider for this visit: Treatment Team:  Attending Provider: Geronimo Amel, MD    06/25/2023 -   Chief Complaint  Patient presents with   Interstitial Lung Disease   Cough   Shortness of Breath   HPI GYNETH HUBKA 83 y.o. -this is a standard of care visit.  This is not a research visit.  She was randomized to study drug on the TVARDI REVERT  study STAT3 inhibitor.  This was end of April 2025.  I saw her on 06/13/2023 during recent visit and she had multiple adverse events.  This is all documented.  We then had to stop the study drug.  Then a week later  she did follow-up with nurse practitioner 06/19/2023 and she was still feeling poorly.  She also had new bruising in the left cheek that picked up 2 days prior to the nurse practitioner visit.  She is now here for standard of care visit.  She is not taking study drug.  At this visit she presents with her husband.  And she has multiple symptoms.  Specifically  -Nausea and diarrhea have resolved approximately 1 day after she saw nurse practitioner Madelin Calin on 06/19/2023  -Xerostomia dry mouth still persists at a mild level  -Low appetite still continues.  Current severity scale is 4 out of 5.  This is improving this started a week after study drug but even without the study drug it still continues although it is improving  -She picked up a bruise in her left cheek 2 days after she saw Madelin Calin on 06/19/2023.  It is improving currently at a 7 cm longitudinal and 3 cm horizontal.  She believes it came from sleeping and rubbing her left wrist on the left cheek.  She believes it is from aspirin .  I agree it is not from study drug  -Chest pains have resolved but she did have a standard of care EKG today and the T wave abnormalities still persist.  I have ordered an echocardiogram  -Fatigue: This still persist.  There is no change it is rated as 5 out of 5.  But she is able to get out of bed  and do her ADLs.  At this point I am not sure it is related to study drug  -Hyponatremia has resolved.  Sodium was 130 on 06/13/2023.  Resolved on 06/20/2023  -She had positive D-dimer and then on 06/20/2023.  CT angiogram chest and there is no pulmonary embolism.   - She is indicating green sputum for the last 10 days very similar to urgent care visit.  I diagnosed acute bronchitis will give her azithromycin  and prednisone  5-day course each  -She is reporting insomnia for the last few weeks.  Sleeping only 2 to 3 hours each night.  Cause not sure.  Not related to study drug   OV 09/16/2023  Subjective:   Patient ID: Elizabeth Espinoza, female , DOB: Jun 14, 1940 , age 75 y.o. , MRN: [id number] , ADDRESS: [address] [place] [address] PCP Geronimo Amel, MD Patient Care Team: Geronimo Amel, MD as PCP - General (Pulmonary Disease) Pietro Redell RAMAN, MD as PCP - Cardiology (Cardiology) Pietro Redell RAMAN, MD as Consulting Physician (Cardiology) Shelah Lamar RAMAN, MD as Consulting Physician (Pulmonary Disease) Fate Morna SAILOR, [facility] (Inactive) as Pharmacist (Pharmacist) Carlie Clark, MD as Consulting Physician (Otolaryngology) [facility] as Consulting Physician (Ophthalmology)  This Provider for this visit: Treatment Team:  Attending Provider: Geronimo Amel, MD     09/16/2023 -   Chief Complaint  Patient presents with   Follow-up    IPF f/u No changes since LOV     HPI Elizabeth CHRISTELLA Butson 83 y.o. -returns for follow-up.  She is now on nintedanib.  She had failed Esbriet  in 2021 after that has been on various study protocols but recently has had side effects with both treprostinil and another STAT3 inhibitor.  And she has had progressive disease therefore after much counseling she agreed to do nintedanib.  She started low-dose protocol.  She is taking donor samples because she is very worried about side effects.  She is only taking 1 mg once daily which is a subtherapeutic dose.  She is been doing this since early August 2025.  I did indicate to her that it is time to increase and then challenge.  Compared to recent visit she is more stable.  A lot of the adverse events from the time she was on the study have resolved.  She only still has some atypical chest tightness in the lower chest.  I did indicate to her this could be because of fibrosis.  Other pulmonary hypertension.  In the interim she is been diagnosed with WHO group 3 pulmonary hypertension with a PVR of 4.  We decided against doing treprostinil because she had failed this versus placebo in a  study getting classic side effects of treprostinil.    Other issues That she complained of insomnia.  I offered her referral to the sleep doctor but she will talk to primary care to start with    OV 11/27/2023  Subjective:  Patient ID: Elizabeth Espinoza, female , DOB: 1940-08-28 , age 68 y.o. , MRN: [id number] , ADDRESS: [address] [place] [address] PCP Geronimo Amel, MD Patient Care Team: Geronimo Amel, MD as PCP - General (Pulmonary Disease) Pietro Redell RAMAN, MD as PCP - Cardiology (Cardiology) Pietro, Redell RAMAN, MD as Consulting Physician (Cardiology) Shelah Lamar RAMAN, MD as Consulting Physician (Pulmonary Disease) Fate Morna SAILOR, [facility] (Inactive) as Pharmacist (Pharmacist) Carlie Clark, MD as Consulting Physician (Otolaryngology) [facility] as Consulting Physician (Ophthalmology)  This Provider for  this visit: Treatment Team:  Attending Provider: Geronimo Amel, MD   Follow-up interstitial lung disease/IPF with CT scan October 2020 being indeterminate versus alternate diagnosis.  History of gardening.  Trace autoimmune ANA positive.  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 Date of test is 02/05/2019. 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  -Date of giving diagnosis is March 16, 2019  - MDD 05/05/19  - esbriet  since 05/06/19- stopped 08/12/19 due to severe GI side effect   IPF patient on -Failed Esbriet  in 2021 because of  of severe GI side effects  --On inhaled treprostinil versus placebo [[place]] -phase 3 trial  -Consent February 2023  -Randomization April 06 2021 -> QUIT early due to side effects (presumably got real drug) -> completed 1 year follow-up spring 2024 - TVARDI REVERRT PHAE 2 study - end April 2025 - randmied -> completed study but quit drug during study due to various AE  - OFEV   chaellenge - low dose protocol early Aug 2025  - After years of negotiation finally agreed to do standard of care therapy with nintedanib instead of research protocol.  [Due to progression and intolerance to research drugs]  #WHO-3 PH-ILD - diagnosied 07/18/23: pulmonary hypertension (PA 46/16, mean 26 mmHg; PVR 4 WU).  -> No treatment due to prior intoelratence to tyvaso in trial setting 2023  #Hiatal hernia   #Nintedanib/Ofev  requires intensive drug monitoring due to high concerns for Adverse effects of , including  Drug Induced Liver Injury, significant GI side effects that include but not limited to Diarrhea, Nausea, Vomiting,  and other system side effects that include Fatigue,  weight loss. Cardiac side effects are a black box warning as well. These will be monitored with  blood work such as LFT initially once a month for 6 months and then quarterly    11/27/2023 -   Chief Complaint  Patient presents with   Interstitial Lung Disease    Pt states since LOV breathing has been about the same, some days have been better SOB occurs when walking  Prod cough occurs in the morning ( phlegm yellow or green)      HPI ARIELY RIDDELL 83 y.o. -she is here with her husband.  Husband is independent historian.  BRANDACE CARGLE is an 83 year old female with pulmonary fibrosis who presents with a persistent cough and breathing difficulties.  She has been experiencing a persistent cough primarily in the morning, producing green sputum for a few weeks. She has been using Mucinex for the past three days, which has helped reduce the coughing. She also uses Flonase in the morning and saline mist at night, which she believes helps with her breathing. No runny nose but reports a 'gooey' sensation.  Her breathing has improved since she started exercising at the [facility], using two liters of oxygen  during exercise. Her breathing is slightly better compared to three to six months ago. She restarted taking Ofev   on October 6th after a brief pause following her sister's death. She is currently on a dose of 100 mg twice a day. She experiences bruising and diarrhea as side effects of the medication. She reports that her watch indicates her oxygen  is low, but when she lies down, her oxygen  jumps up to 97-98%. She uses a watch to monitor her oxygen  levels.  Symptom scores are stable but pulmonary  function test shows a decline.  We discussed switching the nintedanib to Nerandomilast and she is open to this idea.  I went over Nerandomilast in detail.  Described the GI side effects and the benefits.  Additionally, she describes a pain in the right upper quadrant, which was evaluated by another doctor who suggested it might be related to her gallbladder. A scan indicated that her gallbladder is not functioning well, and she is scheduled to see a surgeon for further evaluation. The pain worsens later in the day and is alleviated by using a heating pad.  She wanted a preop evaluation today.     SYMPTOM SCALE - ILD     Inhaled Tyvaso v Placebo. 05/04/2022 Supportive care  09/17/2022 Supporitve care 10/23/2022 Supportive care 02/21/2023 Supportive care 03/14/2023 Acute bronchitis sinusitis otherwise supportive care.  Just completed doxycycline .  Is on prednisone  taper. 04/25/2023  06/25/2023 Tvardid sutdy subject - pff study drug follwing several AE  Took study drug from 05/16/2023 through 06/12/2023 09/16/2023 Low-dose of Ofevsince early August 2025 currently taking her 100 mg once daily 11/27/2023 Low-dose insulin protocol since October 28, 2023  O2 use 10/08/2018  11/07/2018  12/22/2018  06/30/21           Shortness of Breath 0 -> 5 scale with 5 being worst (score 6 If unable to do)  ra ra ra ra ra ra ra ra Uses portable oxygen  at [facility] well Uses portable oxygen  at the rehab center  At rest 0 0 0 0 0 0 0 5 0 3  1 0  Simple tasks - showers, clothes change, eating, shaving 1 1 0 1 1 1.5 1 4 1 2 5 2   Household (dishes,  doing bed, laundry) 2 2 2 3 1  for mos stuff to 5 with vaccuming (3) 1.5 (4 vaccuum) 1.5 3 4.5 3 3 5   Shopping 2 2 0.5 2 2 2 3  x 2.5  2 2   Walking level at own pace 2 1 0.5 1 1 1  x 4 1 2 2 4   Walking up Stairs 4 5 3  2.5 3.5 2.5 3.5 5 2 4 4 4   Total (40 - 48) Dyspnea Score 11 11 6  9.5 9.5 8.5 9 21 11 14 17 15   How bad is your cough? 1 3 2.5 5 3 2  3.5 5 0 3 2 4   How bad is your fatigue 2 2.5 1.5 am, 4.5 pm 4 2 2.5 4.5 4 1 5  x 4  nausea    1 0 0 1 0 0 4 1 2   vomit    0 00 0 0 00 0 0 0 0  diarrhea    0 0 0 0 0 0 0 0 5 diarrhea because of nintedanib  anxiet    00 0 0 0 0 0 0 0 0  depression    0 0 0 0 0 0 0 0 0  pain    Headacne,, sinus complaints  0 0 0 0 0 0 0  0   SIT STAND TEST - goal 15 times   06/25/2023  09/16/2023  11/27/2023   O2 used ra ra ra  PRobe - finter or forehead finger finger finger  Number sit and stand completed - goal 15 15 12 15   Time taken to complete x 54 sec 54.7 sec  Resting Pulse Ox/HR/Dyspnea  97% and 81/min and dyspnea of 7/10  95$ ad HR 78 ad score 0 97% with a heart rate of 78 and a score of  0  Peak measures 95 % and 100/min and dyspnea of 9/10 94% and HR 94 and score 7 96% with a heart rate of 102 and a symptom score of 5  Final Pulse Ox/HR 90% and 88/min and dyspnea of 7/10 91% a JR 88 ad scpre 2 94% with a heart rate of 91 and a symptom score 1.5  Desaturated </= 88% no no No  Desaturated <= 3% points Yes, dropped 7 points Yes, dooped 4 points Yes, 3 points  Got Tachycardic >/= 90/min yes yes Yes  Miscellaneous comments Dyspneiuc, droo  Moderately dyspneic but definitely better than June and July, August 2025     Allergies  Allergen Reactions   Caffeine     palpitations   Chlorpheniramine-Pseudoeph     palpitations   Maxifed     palpitations   Other Other (See Comments)   Pirfenidone  Nausea Only    Esbriet  Causes nausea and weakness     PFT     Latest Ref Rng & Units 11/27/2023    8:24 AM 04/23/2023    7:54 AM 02/18/2023   11:05 AM  09/17/2022   10:11 AM 03/04/2020    1:00 PM 09/15/2019    5:13 PM 03/16/2019    8:42 AM  PFT Results  FVC-Pre L 2.69  P 2.88  2.77  2.78  3.07  2.92  2.91   FVC-Predicted Pre % 99  P 104  102  103  104  99  97   Pre FEV1/FVC % % 79  P 76  77  78  78  80  79   FEV1-Pre L 2.12  P 2.18  2.12  2.18  2.39  2.34  2.29   FEV1-Predicted Pre % 104  P 106  106  108  108  106  102   DLCO uncorrected ml/min/mmHg 7.60  P 9.99  9.27  8.80  12.94  12.84  12.32   DLCO UNC% % 38  P 50  47  45  63  63  60   DLCO corrected ml/min/mmHg  9.99  9.27  8.80  12.94  13.26  12.91   DLCO COR %Predicted %  50  47  45  63  65  63   DLVA Predicted % 53  P 63  57  62  70  71  73     P Preliminary result       LAB RESULTS last 96 hours No results found.       has a past medical history of Allergic rhinitis, GERD (gastroesophageal reflux disease), Glaucoma, Hiatal hernia, Hyperlipidemia, Hypothyroidism, ILD (interstitial lung disease) (HCC), Lichen planopilaris, MVP (mitral valve prolapse), Restless legs, and Urticaria.   reports that she has never smoked. She has never used smokeless tobacco.  Past Surgical History:  Procedure Laterality Date   APPENDECTOMY     with TAH   CARDIAC CATHETERIZATION  1989   negative   COLONOSCOPY  2005   Dr Obie   COLONOSCOPY  01/2013   Dr kristie   esophageal dilation  07/04/2011   Dr Obie SIDE SIGMOIDOSCOPY  1999    Dr Obie   KNEE ARTHROSCOPY  2001, 2005   Dr Beverley   bilat   RIGHT/LEFT HEART CATH AND CORONARY ANGIOGRAPHY N/A 07/18/2023   Procedure: RIGHT/LEFT HEART CATH AND CORONARY ANGIOGRAPHY;  Surgeon: Mady Bruckner, MD;  Location: [facility] INVASIVE [facility];  Service: Cardiovascular;  Laterality: N/A;   TOTAL ABDOMINAL HYSTERECTOMY  for Endometriosis; no BSO, Dr Janyth   UPPER GI ENDOSCOPY  01/2013   Dr Kristie   VIDEO BRONCHOSCOPY Bilateral 02/05/2019   Procedure: VIDEO BRONCHOSCOPY WITH FLUORO;  Surgeon: Geronimo Amel, MD;  Location: [facility] ENDOSCOPY;   Service: Endoscopy;  Laterality: Bilateral;    Allergies  Allergen Reactions   Caffeine     palpitations   Chlorpheniramine-Pseudoeph     palpitations   Maxifed     palpitations   Other Other (See Comments)   Pirfenidone  Nausea Only    Esbriet  Causes nausea and weakness    Immunization History  Administered Date(s) Administered   Fluad Quad(high Dose 65+) 11/15/2020, 11/14/2021, 11/23/2022   INFLUENZA, HIGH DOSE SEASONAL PF 10/27/2012, 11/05/2013, 10/01/2017, 09/11/2018, 11/23/2019   Influenza Split 11/07/2010   Influenza Whole 10/30/2009, 11/27/2011   Influenza,inj,quad, With Preservative 10/08/2016   Influenza-Unspecified 11/24/2014, 10/07/2015, 10/08/2016   PFIZER(Purple Top)SARS-COV-2 Vaccination 03/14/2019, 04/06/2019, 10/27/2019   Pneumococcal Conjugate-13 10/07/2015   Pneumococcal Polysaccharide-23 07/02/2017   Respiratory Syncytial Virus Vaccine ,Recomb Aduvanted(Arexvy ) 12/27/2021   Tdap 02/11/2012   Zoster Recombinant(Shingrix) 10/01/2017, 12/03/2017   Zoster, Live 01/09/2012    Family History  Problem Relation Age of Onset   Lung cancer Father        Asbestos with mesothelioma   Heart disease Paternal Grandmother        ? etiology   Diabetes Paternal Grandmother    Diabetes Brother    Uterine cancer Paternal Aunt    Stroke Paternal Grandfather        > 55   Heart attack Brother 65   Allergies Sister    Aneurysm Mother        congenital   Stroke Brother        two strokes   Barrett's esophagus Brother      Current Outpatient Medications:    aspirin  EC 81 MG tablet, Take 1 tablet (81 mg total) by mouth daily., Disp: 90 tablet, Rfl: 3   azelastine (ASTELIN) 0.1 % nasal spray, Place 2 sprays into both nostrils 2 (two) times daily. Use in each nostril as directed, Disp: 30 mL, Rfl: 12   azithromycin  (ZITHROMAX ) 250 MG tablet, Five hundred milligrams azithromycin  on day 1 followed by 250 mg daily azithromycin  x 4 days for total 5 days, Disp: 6 tablet,  Rfl: 0   cholecalciferol (VITAMIN D3) 25 MCG (1000 UT) tablet, Take 1,000 Units by mouth daily. (Patient taking differently: Take 2,000 Units by mouth daily.), Disp: , Rfl:    clobetasol  (TEMOVATE ) 0.05 % external solution, Apply 1 Application topically 2 (two) times daily., Disp: , Rfl:    Cyanocobalamin  (VITAMIN B-12 PO), Take 2,500 mcg by mouth daily., Disp: , Rfl:    fluticasone  (FLONASE) 50 MCG/ACT nasal spray, Place 1 spray into both nostrils daily., Disp: , Rfl:    hydroxychloroquine  (PLAQUENIL ) 200 MG tablet, Take 1 tablet by mouth twice a day, Disp: 60 tablet, Rfl: 9   loratadine (CLARITIN) 10 MG tablet, Take 10 mg by mouth daily., Disp: , Rfl:    Magnesium  Oxide 250 MG TABS, Take 500 mg by mouth daily., Disp: , Rfl:    Nintedanib (OFEV ) 100 MG CAPS, Take 1 capsule (100 mg total) by mouth 2 (two) times daily., Disp: 180 capsule, Rfl: 1   pravastatin  (PRAVACHOL ) 40 MG tablet, TAKE 1 TABLET BY MOUTH DAILY GENERIC EQUIVALENT FOR PRAVACHOL , Disp: 90 tablet, Rfl: 2   predniSONE  (DELTASONE ) 10 MG tablet, Please take prednisone  40 mg x1 day, then 30 mg x1 day, then  20 mg x1 day, then 10 mg x1 day, and then 5 mg x1 day and stop, Disp: 11 tablet, Rfl: 0   sodium chloride  (BRONCHO SALINE ) inhaler solution, Take 1 spray by nebulization at bedtime., Disp: , Rfl:    SYNTHROID  88 MCG tablet, TAKE 1 TABLET DAILY 30 MINUTES BEFORE BREAKFAST 6 DAYS A WEEK AND 1/2 TABLET ONE DAY A WEEK, Disp: , Rfl:    carboxymethylcellulose 1 % ophthalmic solution, Apply 1 drop to eye as needed (dry eyes). (Patient not taking: Reported on 11/27/2023), Disp: , Rfl:       Objective:   Vitals:   11/27/23 0908  BP: 118/64  Pulse: 81  Temp: 97.8 F (36.6 C)  TempSrc: Oral  SpO2: 96%  Weight: 127 lb 12.8 oz (58 kg)  Height: 5' 5.5 (1.664 m)    Estimated body mass index is 20.94 kg/m as calculated from the following:   Height as of this encounter: 5' 5.5 (1.664 m).   Weight as of this encounter: 127 lb 12.8 oz  (58 kg).  @WEIGHTCHANGE @  [employer]   11/27/23 0908  Weight: 127 lb 12.8 oz (58 kg)     Physical Exam   General: No distress. Look same O2 at rest: no Cane present: no Sitting in wheel chair: no Frail: no Obese: no Neuro: Alert and Oriented x 3. GCS 15. Speech normal Psych: Pleasant Resp:  Barrel Chest - no.  Wheeze - no, Crackles - yes base, No overt respiratory distress CVS: Normal heart sounds. Murmurs - no Ext: Stigmata of Connective Tissue Disease - no HEENT: Normal upper airway. PEERL +. No post nasal drip        Assessment/     Assessment & Plan IPF (idiopathic pulmonary fibrosis) (HCC)  Encounter for therapeutic drug monitoring  Diarrhea due to drug  Preoperative respiratory examination    1) RISK FOR PROLONGED MECHANICAL VENTILAION - > 48h  1A) Arozullah - Prolonged mech ventilation risk Arozullah Postperative Pulmonary Risk Score - for mech ventilation dependence >48h Usaa, Ann Surg 2000, major non-cardiac surgery) Comment Score  Type of surgery - abd ao aneurysm (27), thoracic (21), neurosurgery / upper abdominal / vascular (21), neck (11) Upper abod 21  Emergency Surgery - (11)  0  ALbumin < 3 or poor nutritional state - (9)  0  BUN > 30 -  (8)  0  Partial or completely dependent functional status - (7)  0  COPD -  (6)  6  Age - 60 to 69 (4), > 70  (6)  6  TOTAL  33  Risk Stratifcation scores  - < 10 (0.5%), 11-19 (1.8%), 20-27 (4.2%), 28-40 (10.1%), >40 (26.6%)  Riskl lower to moderate if       1B) GUPTA - Prolonged Mech Vent Risk Score source Risk  Guptal post op prolonged mech ventilation > 48h or reintubation < 30 days - ACS 2007-2008 dataset - solartutor.nl 1.4%   This wide variation in the above scoring system for risk assessment.  Some of this is guarded over the fact the for scoring system study only on demand in a veterans data set.  And therefore overestimates.   In addition if this is laparoscopic surgery the risk is lower.  The second is more accurate and reflective of her age group.  But I still believe it underestimates.  Therefore I think the overall risk is low-moderate for prolonged ventilator dependence.  It is not prohibitive.  She needs aggressive rehab and mobilization  postsurgery.  Postoperative observation in the ICU or stepdown would be advisable  PLAN Patient Instructions  IPF (idiopathic pulmonary fibrosis) (HCC) Therapeutic DRug Monitoring   - Clinically this is stable/slightly progressive. - now on ofev  100mg  low dose protocol since early aug 2025; but still having some amount of intolerance - Last liver function test August 2025 and normal  Plan -check LFT 11/27/2023 -continue ofev  to 100mg  twice daily with food -But start Nerandomilast [start paperwork today] due to GI side effects from nintedanib  - Once you start Nerandomilast stop nintedanib/Ofev  - continue daily exercise at [employer] (Bike with o2) - Diarrhea due to drug Ofev   Plan -Take CAROB Flour for Diarrhea due to medication as follows Take 1 DESSERT spoon  size serving [approximately 7 g] before breakfast If still no response in 3 days then add another 7 g at dinner If still no response in 3 days then make it to spoon servings at breakfast and 2 spoon servings at dinner and hold NOTE: Always MIX the CAROB FLOUR with WATER or MILK or JUICE - MIGHT NEED A BLENDER to do it DO NOT EAT CAROB POWER DIRECTLY - it can choke or make you cough     Right upper quadrant pain -you appear to been diagnosed with gallbladder issues Preop evaluation prior to potential cholecystectomy - new issue  lowmoderate risk for prolonged ventilator dependence  Plan - Any further questions surgeon Dr. Raymund can contact us  - Observe postoperatively in the ICU or stepdown status  Acute bronchitis Chronic sinusitis  Plan  - Please take prednisone  40 mg x1 day, then 30 mg x1 day,  then 20 mg x1 day, then 10 mg x1 day, and then 5 mg x1 day and stop - z pak  - Continue Flonase - Start Astelin nasal spray  Follow-up - 16 weeks standard of care visit -after spiro/dlco    FOLLOWUP    Return for - 16 weeks standard of care visit -after spiro/dlco.    SIGNATURE    Dr. Dorethia Cave, M.D., F.C.C.P,  Pulmonary and Critical Care Medicine Staff Physician, [facility] Director - Interstitial Lung Disease  Program  Pulmonary Fibrosis [facility] at [facility] [place], [place], [address]  Pager: [phone], If no answer or between  15:00h - 7:00h: call [phone] Telephone: [phone]  10:10 AM 11/27/2023 =

## 2023-11-26 NOTE — Patient Instructions (Signed)
 IPF (idiopathic pulmonary fibrosis) (HCC) Therapeutic DRug Monitoring   - Clinically this is stable/slightly progressive. - now on ofev  100mg  low dose protocol since early aug 2025; but still having some amount of intolerance - Last liver function test August 2025 and normal  Plan -check LFT 11/27/2023 -continue ofev  to 100mg  twice daily with food -But start Nerandomilast [start paperwork today] due to GI side effects from nintedanib  - Once you start Nerandomilast stop nintedanib/Ofev  - continue daily exercise at [employer] (Bike with o2) - Diarrhea due to drug Ofev   Plan -Take CAROB Flour for Diarrhea due to medication as follows Take 1 DESSERT spoon  size serving [approximately 7 g] before breakfast If still no response in 3 days then add another 7 g at dinner If still no response in 3 days then make it to spoon servings at breakfast and 2 spoon servings at dinner and hold NOTE: Always MIX the CAROB FLOUR with WATER or MILK or JUICE - MIGHT NEED A BLENDER to do it DO NOT EAT CAROB POWER DIRECTLY - it can choke or make you cough     Right upper quadrant pain -you appear to been diagnosed with gallbladder issues Preop evaluation prior to potential cholecystectomy - new issue  lowmoderate risk for prolonged ventilator dependence  Plan - Any further questions surgeon Dr. Raymund can contact us  - Observe postoperatively in the ICU or stepdown status  Acute bronchitis Chronic sinusitis  Plan  - Please take prednisone  40 mg x1 day, then 30 mg x1 day, then 20 mg x1 day, then 10 mg x1 day, and then 5 mg x1 day and stop - z pak  - Continue Flonase - Start Astelin nasal spray  Follow-up - 16 weeks standard of care visit -after spiro/dlco

## 2023-11-27 ENCOUNTER — Telehealth (HOSPITAL_BASED_OUTPATIENT_CLINIC_OR_DEPARTMENT_OTHER): Payer: Self-pay | Admitting: *Deleted

## 2023-11-27 ENCOUNTER — Telehealth: Payer: Self-pay

## 2023-11-27 ENCOUNTER — Encounter: Payer: Self-pay | Admitting: Internal Medicine

## 2023-11-27 ENCOUNTER — Telehealth: Payer: Self-pay | Admitting: Internal Medicine

## 2023-11-27 ENCOUNTER — Ambulatory Visit: Admitting: Internal Medicine

## 2023-11-27 ENCOUNTER — Ambulatory Visit (INDEPENDENT_AMBULATORY_CARE_PROVIDER_SITE_OTHER)

## 2023-11-27 VITALS — BP 118/64 | HR 81 | Temp 97.8°F | Ht 65.5 in | Wt 127.8 lb

## 2023-11-27 DIAGNOSIS — K521 Toxic gastroenteritis and colitis: Secondary | ICD-10-CM

## 2023-11-27 DIAGNOSIS — J209 Acute bronchitis, unspecified: Secondary | ICD-10-CM

## 2023-11-27 DIAGNOSIS — Z5181 Encounter for therapeutic drug level monitoring: Secondary | ICD-10-CM

## 2023-11-27 DIAGNOSIS — J84112 Idiopathic pulmonary fibrosis: Secondary | ICD-10-CM | POA: Diagnosis not present

## 2023-11-27 DIAGNOSIS — Z01811 Encounter for preprocedural respiratory examination: Secondary | ICD-10-CM | POA: Diagnosis not present

## 2023-11-27 DIAGNOSIS — T50995A Adverse effect of other drugs, medicaments and biological substances, initial encounter: Secondary | ICD-10-CM | POA: Diagnosis not present

## 2023-11-27 DIAGNOSIS — J329 Chronic sinusitis, unspecified: Secondary | ICD-10-CM

## 2023-11-27 DIAGNOSIS — K828 Other specified diseases of gallbladder: Secondary | ICD-10-CM | POA: Diagnosis not present

## 2023-11-27 LAB — PULMONARY FUNCTION TEST
DL/VA % pred: 53 %
DL/VA: 2.13 ml/min/mmHg/L
DLCO unc % pred: 38 %
DLCO unc: 7.6 ml/min/mmHg
FEF 25-75 Pre: 1.87 L/s
FEF2575-%Pred-Pre: 133 %
FEV1-%Pred-Pre: 104 %
FEV1-Pre: 2.12 L
FEV1FVC-%Pred-Pre: 106 %
FEV6-%Pred-Pre: 104 %
FEV6-Pre: 2.67 L
FEV6FVC-%Pred-Pre: 104 %
FVC-%Pred-Pre: 99 %
FVC-Pre: 2.69 L
Pre FEV1/FVC ratio: 79 %
Pre FEV6/FVC Ratio: 99 %

## 2023-11-27 LAB — HEPATIC FUNCTION PANEL
ALT: 12 U/L (ref 0–35)
AST: 20 U/L (ref 0–37)
Albumin: 4.2 g/dL (ref 3.5–5.2)
Alkaline Phosphatase: 68 U/L (ref 39–117)
Bilirubin, Direct: 0.1 mg/dL (ref 0.0–0.3)
Total Bilirubin: 0.5 mg/dL (ref 0.2–1.2)
Total Protein: 6.8 g/dL (ref 6.0–8.3)

## 2023-11-27 MED ORDER — AZITHROMYCIN 250 MG PO TABS: ORAL_TABLET | ORAL | 0 refills | Status: DC

## 2023-11-27 MED ORDER — AZELASTINE HCL 0.1 % NA SOLN: 2.0000 | Freq: Two times a day (BID) | NASAL | 12 refills | Status: AC

## 2023-11-27 MED ORDER — PREDNISONE 10 MG PO TABS: ORAL_TABLET | ORAL | 0 refills | Status: DC

## 2023-11-27 NOTE — Telephone Encounter (Signed)
 Elizabeth Espinoza.please am stopping nintedanib because of prior side effects and even on low-dose protocol she has had problems.  Diagnosis is IPF and progressive

## 2023-11-27 NOTE — Telephone Encounter (Signed)
   Pre-operative Risk Assessment    Patient Name: Elizabeth Espinoza  DOB: [birth year] MRN: [id number]   Date of last office visit: 08/07/2023 Date of next office visit: 02/17/2024  Request for Surgical Clearance    Procedure:  Gallbladder Surgery  Date of Surgery:  Clearance TBD                                 Surgeon:  Dr. Lynda Leos Surgeon's Group or Practice Name:  [facility] Phone number:  [phone] Fax number:  [phone]   Type of Clearance Requested:   - Medical  - Pharmacy:  Hold Aspirin  Not indicated   Type of Anesthesia:  General    Additional requests/questions:    Signed, Edsel Grayce Sanders   11/27/2023, 4:47 PM

## 2023-11-27 NOTE — Telephone Encounter (Signed)
 Received message from Dr. Geronimo in separate thread -   Elizabeth Espinoza.please am stopping nintedanib because of prior side effects and even on low-dose protocol she has had problems.  Diagnosis is IPF and progressive      Initiating benefits investigation in this thread. Per note, paperwork was initiated at [facility] today.

## 2023-11-27 NOTE — Patient Instructions (Signed)
Spiro/DLCO performed today. 

## 2023-11-27 NOTE — Progress Notes (Signed)
Spiro/DLCO performed today. 

## 2023-11-27 NOTE — Telephone Encounter (Signed)
Patient has been scheduled for ov

## 2023-11-27 NOTE — Telephone Encounter (Signed)
   Name: Elizabeth Espinoza  DOB: 06/30/1940  MRN: [id number]  Primary Cardiologist: Redell Shallow, MD  Chart reviewed as part of pre-operative protocol coverage. Because of Elizabeth Espinoza's past medical history and time since last visit, she will require a follow-up in-office visit in order to better assess preoperative cardiovascular risk. She was continuing to have chest pain on last office visit with Elizabeth Espinoza in July of 2025.  Pre-op covering staff: - Please schedule appointment and call patient to inform them. If patient already had an upcoming appointment within acceptable timeframe, please add pre-op clearance to the appointment notes so provider is aware. - Please contact requesting surgeon's office via preferred method (i.e, phone, fax) to inform them of need for appointment prior to surgery.     Lamarr Satterfield, NP  11/27/2023, 4:51 PM

## 2023-11-28 NOTE — Telephone Encounter (Signed)
 Submitted a Prior Authorization request to [facility] for [name] via CoverMyMeds. Will update once we receive a response.  Key: [id number]

## 2023-12-02 ENCOUNTER — Ambulatory Visit: Attending: Internal Medicine

## 2023-12-02 ENCOUNTER — Telehealth: Payer: Self-pay | Admitting: *Deleted

## 2023-12-02 ENCOUNTER — Other Ambulatory Visit (HOSPITAL_COMMUNITY): Payer: Self-pay

## 2023-12-02 ENCOUNTER — Ambulatory Visit: Payer: Self-pay | Admitting: Internal Medicine

## 2023-12-02 DIAGNOSIS — J849 Interstitial pulmonary disease, unspecified: Secondary | ICD-10-CM

## 2023-12-02 MED ORDER — JASCAYD 18 MG PO TABS: 18.0000 mg | ORAL_TABLET | Freq: Two times a day (BID) | ORAL | 3 refills | Status: AC

## 2023-12-02 NOTE — Progress Notes (Unsigned)
 Cardiology Office Note:    Date:  12/04/2023   ID:  Elizabeth Espinoza, DOB August 22, 1940, MRN [id number]  PCP:  Geronimo Amel, MD   [facility] Providers Cardiologist:  Redell Shallow, MD     Referring MD: Geronimo Amel, MD   Chief Complaint  Patient presents with   Pre-op Exam    History of Present Illness:    Elizabeth Espinoza is a 83 y.o. 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 Heart monitor 10/2022 for palpitations showed short runs of SVT with the longest episode lasting 15 beats and occasional PVCs.    She was seen 06/2022 with worsening exertional CP and SOB. She underwent [facility] 06/2023 demonstrated nonobstructive CAD and normal right and left heart filling pressures with mild to moderate pulmonary hypertension, mean PA 26 mmHg.  At follow-up, chest pain may have been improving off of interstitial lung disease study trial drug.  She returns today for follow-up.  She is here for preoperative risk evaluation prior to lap chole. She denies angina, has been dealing with left upper abdominal pain related to cholecystectomy. Question if prior chest pain that prompted angiography was related to gallbladder issues. She is able to go to [place] and uses O2 while exercising  -she can do the recumbent bike and recumbent elliptical machine. Dyspnea is her limiting factor with ILD.   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    Past Medical History:  Diagnosis Date   Allergic rhinitis    GERD (gastroesophageal reflux disease)    Glaucoma    SUSPECT   Hiatal hernia    Hyperlipidemia    LDL goal = < 100   Hypothyroidism    ILD (interstitial lung disease) (HCC)    Lichen planopilaris    MVP (mitral valve prolapse)    documented on 2 D ECHO   Restless legs    Urticaria     Past Surgical History:  Procedure Laterality Date    APPENDECTOMY     with TAH   CARDIAC CATHETERIZATION  1989   negative   COLONOSCOPY  2005   Dr Obie   COLONOSCOPY  01/2013   Dr kristie   esophageal dilation  07/04/2011   Dr Obie SIDE SIGMOIDOSCOPY  1999    Dr Obie   KNEE ARTHROSCOPY  2001, 2005   Dr Beverley   bilat   RIGHT/LEFT HEART CATH AND CORONARY ANGIOGRAPHY N/A 07/18/2023   Procedure: RIGHT/LEFT HEART CATH AND CORONARY ANGIOGRAPHY;  Surgeon: Mady Bruckner, MD;  Location: [facility] INVASIVE CV LAB;  Service: Cardiovascular;  Laterality: N/A;   TOTAL ABDOMINAL HYSTERECTOMY     for Endometriosis; no BSO, Dr Janyth   UPPER GI ENDOSCOPY  01/2013   Dr Kristie   VIDEO BRONCHOSCOPY Bilateral 02/05/2019   Procedure: VIDEO BRONCHOSCOPY WITH FLUORO;  Surgeon: Geronimo Amel, MD;  Location: [facility] ENDOSCOPY;  Service: Endoscopy;  Laterality: Bilateral;    Current Medications: Current Meds  Medication Sig   aspirin  EC 81 MG tablet Take 1 tablet (81 mg total) by mouth daily.   azelastine (ASTELIN) 0.1 % nasal spray Place 2 sprays into both nostrils 2 (two) times daily. Use in each nostril as directed   azithromycin  (ZITHROMAX ) 250 MG tablet Five hundred milligrams azithromycin  on day 1 followed by 250 mg daily azithromycin  x 4 days for total 5 days   carboxymethylcellulose 1 % ophthalmic solution  Apply 1 drop to eye as needed (dry eyes).   cholecalciferol (VITAMIN D3) 25 MCG (1000 UT) tablet Take 1,000 Units by mouth daily. (Patient taking differently: Take 2,000 Units by mouth daily.)   clobetasol  (TEMOVATE ) 0.05 % external solution Apply 1 Application topically 2 (two) times daily.   Cyanocobalamin  (VITAMIN B-12 PO) Take 2,500 mcg by mouth daily.   fluticasone  (FLONASE) 50 MCG/ACT nasal spray Place 1 spray into both nostrils daily.   hydroxychloroquine  (PLAQUENIL ) 200 MG tablet Take 1 tablet by mouth twice a day   loratadine (CLARITIN) 10 MG tablet Take 10 mg by mouth daily.   Magnesium  Oxide 250 MG TABS Take 500 mg by mouth daily.    nerandomilast ([name]) 18 MG tablet Take 18 mg by mouth in the morning and at bedtime.   pravastatin  (PRAVACHOL ) 40 MG tablet TAKE 1 TABLET BY MOUTH DAILY GENERIC EQUIVALENT FOR PRAVACHOL    predniSONE  (DELTASONE ) 10 MG tablet Please take prednisone  40 mg x1 day, then 30 mg x1 day, then 20 mg x1 day, then 10 mg x1 day, and then 5 mg x1 day and stop   sodium chloride  (BRONCHO SALINE ) inhaler solution Take 1 spray by nebulization at bedtime.   SYNTHROID  88 MCG tablet TAKE 1 TABLET DAILY 30 MINUTES BEFORE BREAKFAST 6 DAYS A WEEK AND 1/2 TABLET ONE DAY A WEEK     Allergies:   Caffeine, Chlorpheniramine-pseudoeph, Maxifed, Other, and Pirfenidone    Social History   Socioeconomic History   Marital status: Married    Spouse name: Lynwood   Number of children: 2   Years of education: Not on file   Highest education level: 12th grade  Occupational History   Occupation: retired from [employer] retired  Tobacco Use   Smoking status: Never   Smokeless tobacco: Never  Vaping Use   Vaping status: Never Used  Substance and Sexual Activity   Alcohol use: Yes    Comment:  rarely   Drug use: No   Sexual activity: Yes  Other Topics Concern   Not on file  Social History Narrative   Live at home with husband   Social Drivers of Corporate Investment Banker Strain: Low Risk  (03/21/2023)   Overall Financial Resource Strain (CARDIA)    Difficulty of Paying Living Expenses: Not hard at all  Food Insecurity: No Food Insecurity (03/21/2023)   Hunger Vital Sign    Worried About Running Out of Food in the Last Year: Never true    Ran Out of Food in the Last Year: Never true  Transportation Needs: No Transportation Needs (03/21/2023)   PRAPARE - Administrator, Civil Service (Medical): No    Lack of Transportation (Non-Medical): No  Physical Activity: Inactive (07/19/2023)   Exercise Vital Sign    Days of Exercise per Week: 0 days    Minutes of Exercise per Session: Not on file  Stress: No  Stress Concern Present (07/19/2023)   [employer] of Occupational Health - Occupational Stress Questionnaire    Feeling of Stress: Not at all  Social Connections: Moderately Integrated (07/19/2023)   Social Connection and Isolation Panel    Frequency of Communication with Friends and Family: More than three times a week    Frequency of Social Gatherings with Friends and Family: Once a week    Attends Religious Services: Never    Database Administrator or Organizations: Yes    Attends Banker Meetings: Never    Marital Status: Married  Family History: The patient's family history includes Allergies in her sister; Aneurysm in her mother; Barrett's esophagus in her brother; Diabetes in her brother and paternal grandmother; Heart attack (age of onset: 90) in her brother; Heart disease in her paternal grandmother; Lung cancer in her father; Stroke in her brother and paternal grandfather; Uterine cancer in her paternal aunt.  ROS:   Please see the history of present illness.     All other systems reviewed and are negative.  EKGs/Labs/Other Studies Reviewed:    The following studies were reviewed today:  EKG Interpretation Date/Time:  Wednesday December 04 2023 09:45:33 EST Ventricular Rate:  80 PR Interval:  140 QRS Duration:  82 QT Interval:  368 QTC Calculation: 424 R Axis:   30  Text Interpretation: Normal sinus rhythm ST & T wave abnormality, consider inferior ischemia When compared with ECG of 16-Jul-2023 15:12, T wave inversion more evident in Inferior leads Confirmed by Madie Slough ([id number]) on 12/04/2023 9:57:07 AM    Recent Labs: 06/19/2023: Magnesium  2.2 07/22/2023: BUN 15; Creatinine, Ser 0.76; Hemoglobin 12.5; Platelets 241.0; Potassium 4.0; Sodium 137 10/09/2023: TSH 3.03 11/27/2023: ALT 12  Recent Lipid Panel    Component Value Date/Time   CHOL 122 07/22/2023 0923   CHOL 144 08/27/2022 0830   TRIG 105.0 07/22/2023 0923   HDL 50.00 07/22/2023 0923    HDL 59 08/27/2022 0830   CHOLHDL 2 07/22/2023 0923   VLDL 21.0 07/22/2023 0923   LDLCALC 51 07/22/2023 0923   LDLCALC 67 08/27/2022 0830     Risk Assessment/Calculations:                Physical Exam:    VS:  BP 124/68   Pulse 80   Ht 5' 6 (1.676 m)   Wt 125 lb (56.7 kg)   SpO2 98%   BMI 20.18 kg/m     Wt Readings from Last 3 Encounters:  12/04/23 125 lb (56.7 kg)  11/27/23 127 lb 12.8 oz (58 kg)  09/16/23 131 lb 9.6 oz (59.7 kg)     GEN:  Well nourished, well developed in no acute distress HEENT: Normal NECK: No JVD; No carotid bruits LYMPHATICS: No lymphadenopathy CARDIAC: RRR, no murmurs, rubs, gallops RESPIRATORY:  Clear to auscultation without rales, wheezing or rhonchi  ABDOMEN: Soft, non-tender, non-distended MUSCULOSKELETAL:  No edema; No deformity  SKIN: Warm and dry NEUROLOGIC:  Alert and oriented x 3 PSYCHIATRIC:  Normal affect   ASSESSMENT:    1. Coronary artery disease involving native coronary artery of native heart with angina pectoris   2. Palpitations   3. PSVT (paroxysmal supraventricular tachycardia)   4. Tachycardia   5. Pulmonary hypertension, unspecified (HCC)   6. ILD (interstitial lung disease) (HCC)   7. Hyperlipidemia with target LDL less than 70   8. Paroxysmal SVT (supraventricular tachycardia)   9. Preoperative cardiovascular examination    PLAN:    In order of problems listed above:  Chest pain - Nonobstructive CAD by heart catheterization 06/2023 - Did not do well with beta-blocker in the past - Could consider calcium channel blocker versus ranolazine if she remains symptomatic - Focus on management of ILD -- no complaints of angina - remains as active as possible given ILD -- question if prior CP related to gallbladder   Pulmonary hypertension ILD - Mild to moderate pulmonary hypertension on right heart catheterization with a mean PA pressure of 26 mmHg in 06/2023 - Pulmonary hypertension likely related to  interstitial lung disease, she follows  with pulmonology -- uses supplemental O2 when exercising, not using today in the office   Hyperlipidemia with LDL less than 70 - Continue 40 mg pravastatin    Paroxysmal SVT - Palpitations in the past - no current complaints - Reassuring heart monitor in 2024 - She is taking p.o. magnesium  OTC - 500 mg   Preoperative risk evaluation prior to cholecystectomy She has ILD that limits activity. She still can complete 4.0 METS without angina. She has no obstructive CAD by heart cath 06/2023. No hx of stroke. She has no unstable cardiac conditions. She has a low risk of MACE and may proceed with surgery at acceptable risk without further cardiac testing.  She remains on ASA, which can be held for 5-7 days prior to surgery.   Dr. Rubin Fax: [phone]      Medication Adjustments/Labs and Tests Ordered: Current medicines are reviewed at length with the patient today.  Concerns regarding medicines are outlined above.  Orders Placed This Encounter  Procedures   EKG 12-Lead   No orders of the defined types were placed in this encounter.   Patient Instructions  Medication Instructions:  Your physician recommends that you continue on your current medications as directed. Please refer to the Current Medication list given to you today.  *If you need a refill on your cardiac medications before your next appointment, please call your pharmacy*   Follow-Up: Keep upcoming appointments   Signed, Jon Nat Hails, [place]  12/04/2023 11:00 AM    [facility]

## 2023-12-02 NOTE — Telephone Encounter (Signed)
 Copied from CRM #[id number]. Topic: Clinical - Medication Prior Auth >> Nov 29, 2023  9:53 AM Devaughn RAMAN wrote: Reason for CRM: [place] with [employer] called and stated the pt medication [name] has been approved for coverage through her Medicare Pt D Coverage and it will be faxed to the office and the approval is from 11/28/23-11/27/24.

## 2023-12-02 NOTE — Telephone Encounter (Signed)
 [name] new start counseling completed in separate thread - see pharmacotherapy clinic encounter.

## 2023-12-02 NOTE — Telephone Encounter (Signed)
 ATC patient for new start [name] counseling - she requests a call back later this afternoon to discuss.

## 2023-12-02 NOTE — Telephone Encounter (Signed)
 Received notification from [facility] regarding a prior authorization for [name]. Authorization has been APPROVED from 11/28/23 to 11/27/24. Approval letter sent to scan center.  Per test claim, copay for 30 days supply is $0  Patient can fill through [facility]: [phone]  Authorization # [id number] Phone # [phone]

## 2023-12-02 NOTE — Progress Notes (Signed)
 [facility]  Referring Provider: Dr. Geronimo  Virtual Visit via Telephone Note  I connected with Elizabeth Espinoza on 12/02/23 at  3:30 PM EST by telephone and verified that I am speaking with the correct person using two identifiers.  Location: Patient: home Provider: office   I discussed the limitations, risks, security and privacy concerns of performing an evaluation and management service by telephone and the availability of in person appointments. I also discussed with the patient that there may be a patient responsible charge related to this service. The patient expressed understanding and agreed to proceed.   Subjective:  Patient called today by [facility] team for Elizabeth Espinoza  new start.   Patient was last seen by Dr. Geronimo on 11/27/23.  Pertinent past medical history includes IPF. Prior therapy includes (current) Ofev  low-dose protocol, (previous) Esbriet  from 02/05/19 - 08/12/19.  Currently taking antifibrotic: Ofev  100mg  BID - started Aug 2025. She is having diarrhea on low dose protocol.   Objective: Allergies  Allergen Reactions   Caffeine     palpitations   Chlorpheniramine-Pseudoeph     palpitations   Maxifed     palpitations   Other Other (See Comments)   Pirfenidone  Nausea Only    Esbriet  Causes nausea and weakness    Outpatient Encounter Medications as of 12/02/2023  Medication Sig   aspirin  EC 81 MG tablet Take 1 tablet (81 mg total) by mouth daily.   azelastine  (ASTELIN ) 0.1 % nasal spray Place 2 sprays into both nostrils 2 (two) times daily. Use in each nostril as directed   azithromycin  (ZITHROMAX ) 250 MG tablet Five hundred milligrams azithromycin  on day 1 followed by 250 mg daily azithromycin  x 4 days for total 5 days   carboxymethylcellulose 1 % ophthalmic solution Apply 1 drop to eye as needed (dry eyes). (Patient not taking: Reported on 11/27/2023)   cholecalciferol (VITAMIN D3) 25 MCG (1000 UT) tablet Take  1,000 Units by mouth daily. (Patient taking differently: Take 2,000 Units by mouth daily.)   clobetasol  (TEMOVATE ) 0.05 % external solution Apply 1 Application topically 2 (two) times daily.   Cyanocobalamin  (VITAMIN B-12 PO) Take 2,500 mcg by mouth daily.   fluticasone  (FLONASE) 50 MCG/ACT nasal spray Place 1 spray into both nostrils daily.   hydroxychloroquine  (PLAQUENIL ) 200 MG tablet Take 1 tablet by mouth twice a day   loratadine (CLARITIN) 10 MG tablet Take 10 mg by mouth daily.   Magnesium  Oxide 250 MG TABS Take 500 mg by mouth daily.   Nintedanib (OFEV ) 100 MG CAPS Take 1 capsule (100 mg total) by mouth 2 (two) times daily.   pravastatin  (PRAVACHOL ) 40 MG tablet TAKE 1 TABLET BY MOUTH DAILY GENERIC EQUIVALENT FOR PRAVACHOL    predniSONE  (DELTASONE ) 10 MG tablet Please take prednisone  40 mg x1 day, then 30 mg x1 day, then 20 mg x1 day, then 10 mg x1 day, and then 5 mg x1 day and stop   sodium chloride  (BRONCHO SALINE ) inhaler solution Take 1 spray by nebulization at bedtime.   SYNTHROID  88 MCG tablet TAKE 1 TABLET DAILY 30 MINUTES BEFORE BREAKFAST 6 DAYS A WEEK AND 1/2 TABLET ONE DAY A WEEK   No facility-administered encounter medications on file as of 12/02/2023.     Immunization History  Administered Date(s) Administered   Fluad Quad(high Dose 65+) 11/15/2020, 11/14/2021, 11/23/2022   INFLUENZA, HIGH DOSE SEASONAL PF 10/27/2012, 11/05/2013, 10/01/2017, 09/11/2018, 11/23/2019   Influenza Split 11/07/2010   Influenza Whole 10/30/2009, 11/27/2011   Influenza,inj,quad, With  Preservative 10/08/2016   Influenza-Unspecified 11/24/2014, 10/07/2015, 10/08/2016   PFIZER(Purple Top)SARS-COV-2 Vaccination 03/14/2019, 04/06/2019, 10/27/2019   Pneumococcal Conjugate-13 10/07/2015   Pneumococcal Polysaccharide-23 07/02/2017   Respiratory Syncytial Virus Vaccine ,Recomb Aduvanted(Arexvy ) 12/27/2021   Tdap 02/11/2012   Zoster Recombinant(Shingrix) 10/01/2017, 12/03/2017   Zoster, Live 01/09/2012      CMP     Component Value Date/Time   NA 137 07/22/2023 0923   NA 142 07/16/2023 1625   K 4.0 07/22/2023 0923   CL 105 07/22/2023 0923   CO2 23 07/22/2023 0923   GLUCOSE 89 07/22/2023 0923   BUN 15 07/22/2023 0923   BUN 15 07/16/2023 1625   CREATININE 0.76 07/22/2023 0923   CREATININE 0.78 11/12/2018 1118   CREATININE 0.80 11/07/2010 1027   CALCIUM 9.2 07/22/2023 0923   CALCIUM 9.3 11/12/2018 1118   PROT 6.8 11/27/2023 1016   PROT 6.7 08/27/2022 0830   ALBUMIN 4.2 11/27/2023 1016   ALBUMIN 4.5 08/27/2022 0830   AST 20 11/27/2023 1016   AST 20 11/12/2018 1118   ALT 12 11/27/2023 1016   ALT 14 11/12/2018 1118   ALKPHOS 68 11/27/2023 1016   BILITOT 0.5 11/27/2023 1016   BILITOT 0.6 08/27/2022 0830   BILITOT 0.8 11/12/2018 1118   GFRNONAA >60 11/12/2018 1118   GFRAA >60 11/12/2018 1118     HRCT (05/21/23) -  IMPRESSION: 1. Spectrum of findings compatible with moderate fibrotic interstitial lung disease with early honeycombing. Findings appear slightly progressive in the interval with clear progression from baseline 02/10/2018 CT. Findings are consistent with UIP per consensus guidelines: Diagnosis of idiopathic pulmonary fibrosis: An official ats/ers/jrs/alat clinical practice guideline. Am j respir crit care med vol 198, iss 5, pp e44-e68, Sep 22 2016.  Assessment and Plan  Elizabeth Espinoza  Medication Management Thoroughly counseled patient on the efficacy, mechanism of action, dosing, administration, adverse effects, and monitoring parameters of Elizabeth Espinoza .  Patient verbalized understanding.   Goals of Therapy: Will not stop or reverse the progression of ILD. It will slow the progression of ILD.   Dosing: Recommended dose will be 18mg  tablet,  1 tablet twice daily. May be administered with or without regard to food.   Adverse Effects: Weight loss (nerandomilast  monotherapy: 8%; background nintedanib: 14-16%; background pirfenidone : 6%) Decreased appetite (nerandomilast   monotherapy: 6% to 9%; concomitant nintedanib: 7% to 10%; concomitant pirfenidone : 13%) Diarrhea (nerandomilast  monotherapy: 17% to 26%; concomitant nintedanib: 50% to 62%; concomitant pirfenidone : 24%)  Monitoring: Monitor for diarrhea, decreased appetite, weight loss  Drug interactions: nerandomilast  is a major substrate of CYP3A4. Avoid use of moderate and strong CYP3A4 inducers or inhibitors.  No documented active moderate or strong CYP3A4 inhibitors or inducers  Access: Approval of Elizabeth Espinoza  through: insurance Rx sent to: [facility]: [phone]  Medication Reconciliation A drug regimen assessment was performed, including review of allergies, interactions, disease-state management, dosing and immunization history. Medications were reviewed with the patient, including name, instructions, indication, goals of therapy, potential side effects, importance of adherence, and safe use.  PLAN:  1) START Elizabeth Espinoza  18mg  1 tablet twice daily.  2) STOP taking Ofev  when you start taking Elizabeth Espinoza . Medication list updated.  3) Follow-up with Dr. Geronimo as planned on 03/13/24  Contact pharmacy team if there are any barriers to obtaining medication. Patient was given pharmacy team contact information and [facility] contact information.   I discussed the assessment and treatment plan with the patient. The patient was provided an opportunity to ask questions and all were answered. The patient agreed with the plan  and demonstrated an understanding of the instructions.   The patient was advised to call back or seek an in-person evaluation if the symptoms worsen or if the condition fails to improve as anticipated.  I provided 15 minutes of non-face-to-face time during this encounter.  Aleck Puls, PharmD, BCPS, CPP Clinical Pharmacist  [facility]

## 2023-12-03 ENCOUNTER — Telehealth: Payer: Self-pay | Admitting: *Deleted

## 2023-12-03 NOTE — Telephone Encounter (Signed)
 Fax received from Dr. Lynda Leos with [facility] to perform a gallbladder surgery on patient.  Patient needs surgery clearance. Surgery is pending. Patient was seen on 11/27/23. Office protocol is a risk assessment can be sent to surgeon if patient has been seen in 60 days or less.   Risk assessment from OV 11/27/23 has been faxed to [facility].

## 2023-12-04 ENCOUNTER — Ambulatory Visit: Attending: Physician Assistant | Admitting: Physician Assistant

## 2023-12-04 ENCOUNTER — Encounter: Payer: Self-pay | Admitting: Physician Assistant

## 2023-12-04 VITALS — BP 124/68 | HR 80 | Ht 66.0 in | Wt 125.0 lb

## 2023-12-04 DIAGNOSIS — R000 Tachycardia, unspecified: Secondary | ICD-10-CM

## 2023-12-04 DIAGNOSIS — I272 Pulmonary hypertension, unspecified: Secondary | ICD-10-CM

## 2023-12-04 DIAGNOSIS — R002 Palpitations: Secondary | ICD-10-CM

## 2023-12-04 DIAGNOSIS — I25119 Atherosclerotic heart disease of native coronary artery with unspecified angina pectoris: Secondary | ICD-10-CM

## 2023-12-04 DIAGNOSIS — I471 Supraventricular tachycardia, unspecified: Secondary | ICD-10-CM

## 2023-12-04 DIAGNOSIS — E785 Hyperlipidemia, unspecified: Secondary | ICD-10-CM

## 2023-12-04 DIAGNOSIS — Z01810 Encounter for preprocedural cardiovascular examination: Secondary | ICD-10-CM | POA: Diagnosis not present

## 2023-12-04 DIAGNOSIS — J849 Interstitial pulmonary disease, unspecified: Secondary | ICD-10-CM

## 2023-12-04 NOTE — Patient Instructions (Signed)
 Medication Instructions:  Your physician recommends that you continue on your current medications as directed. Please refer to the Current Medication list given to you today.  *If you need a refill on your cardiac medications before your next appointment, please call your pharmacy*  Follow-Up:  Keep upcoming appointments

## 2023-12-05 ENCOUNTER — Other Ambulatory Visit (HOSPITAL_BASED_OUTPATIENT_CLINIC_OR_DEPARTMENT_OTHER): Payer: Self-pay

## 2023-12-20 ENCOUNTER — Telehealth: Payer: Self-pay | Admitting: Internal Medicine

## 2023-12-20 ENCOUNTER — Ambulatory Visit (HOSPITAL_COMMUNITY)
Admission: RE | Admit: 2023-12-20 | Discharge: 2023-12-20 | Disposition: A | Discharge status: Home / Self Care | Attending: Physician Assistant | Admitting: Physician Assistant

## 2023-12-20 ENCOUNTER — Encounter (HOSPITAL_COMMUNITY): Payer: Self-pay

## 2023-12-20 ENCOUNTER — Ambulatory Visit (INDEPENDENT_AMBULATORY_CARE_PROVIDER_SITE_OTHER)

## 2023-12-20 ENCOUNTER — Other Ambulatory Visit (HOSPITAL_COMMUNITY): Payer: Self-pay

## 2023-12-20 VITALS — BP 107/59 | HR 75 | Temp 97.4°F | Resp 18

## 2023-12-20 DIAGNOSIS — R051 Acute cough: Secondary | ICD-10-CM

## 2023-12-20 DIAGNOSIS — U071 COVID-19: Secondary | ICD-10-CM | POA: Diagnosis not present

## 2023-12-20 LAB — POC COVID19/FLU A&B COMBO
Covid Antigen, POC: POSITIVE — AB
Influenza A Antigen, POC: NEGATIVE
Influenza B Antigen, POC: NEGATIVE

## 2023-12-20 MED ORDER — MOLNUPIRAVIR 200 MG PO CAPS: 4.0000 | ORAL_CAPSULE | Freq: Two times a day (BID) | ORAL | 0 refills | Status: DC

## 2023-12-20 MED ORDER — PAXLOVID (300/100) 20 X 150 MG & 10 X 100MG PO TBPK: 3.0000 | ORAL_TABLET | Freq: Two times a day (BID) | ORAL | 0 refills | Status: AC

## 2023-12-20 MED ORDER — BENZONATATE 100 MG PO CAPS: 100.0000 mg | ORAL_CAPSULE | Freq: Three times a day (TID) | ORAL | 0 refills | Status: AC

## 2023-12-20 NOTE — ED Provider Notes (Signed)
 [facility]    CSN: [id number] Arrival date & time: 12/20/23  1321      History   Chief Complaint Chief Complaint  Patient presents with   Cough    HPI Elizabeth Espinoza is a 83 y.o. female.   Today with a 2-day history of URI symptoms including head congestion, sore throat, ear fullness, worsening cough.  She denies any fever, chest pain, shortness of breath increased from baseline, nausea, vomiting.  She denies any known sick contacts.  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  She is currently on [name]; started this medication within the past few weeks.  She was seen by her pulmonologist earlier this month at which point she was given a short course of prednisone  but denies additional steroids or antibiotics in the past 90 days.  She has not had COVID recently.  She has had initial COVID vaccines but not most recent booster.  Denies any history of diabetes.  She is anxious to feel better she is scheduled for cholecystectomy in a few weeks.  She is having difficulty with her daily duties as a result of symptoms.    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   Leg cramping 07/16/2022   Senile purpura 07/16/2022   H/O iron deficiency 05/01/2022   Increased thirst 05/01/2022   Allergy 07/12/2021   Hearing loss 07/12/2021   Aortic atherosclerosis 07/06/2020   Research study patient 04/11/2020   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    Bilateral shoulder pain 11/18/2018   Bony sclerosis 10/21/2018   Chronic cough 01/29/2018   Prediabetes 02/24/2015   Dyspnea on exertion 09/29/2014   ILD (interstitial lung disease) (HCC) 12/11/2012   Barrett's esophagus 12/11/2012   RLS (restless legs syndrome) 04/13/2011   ARTHRALGIA 09/01/2008   HIATAL HERNIA 07/31/2007   History of cardiovascular disorder 07/31/2007   Allergic rhinitis 06/19/2007   GASTROESOPHAGEAL REFLUX DISEASE, CHRONIC 06/19/2007   Hypothyroidism 02/07/2007   HYPERLIPIDEMIA 02/07/2007    Past Surgical History:  Procedure Laterality Date   APPENDECTOMY     with TAH   CARDIAC CATHETERIZATION  1989   negative   COLONOSCOPY  2005   Dr Obie   COLONOSCOPY  01/2013   Dr kristie   esophageal dilation  07/04/2011   Dr Obie SIDE SIGMOIDOSCOPY  1999    Dr Obie   KNEE ARTHROSCOPY  2001, 2005   Dr Beverley   bilat   RIGHT/LEFT HEART CATH AND CORONARY ANGIOGRAPHY N/A 07/18/2023   Procedure: RIGHT/LEFT HEART CATH AND CORONARY ANGIOGRAPHY;  Surgeon: Mady Bruckner, MD;  Location: [facility];  Service: Cardiovascular;  Laterality: N/A;   TOTAL ABDOMINAL HYSTERECTOMY     for Endometriosis; no BSO, Dr Janyth   UPPER GI ENDOSCOPY  01/2013   Dr Kristie   VIDEO BRONCHOSCOPY Bilateral  02/05/2019   Procedure: VIDEO BRONCHOSCOPY WITH FLUORO;  Surgeon: Geronimo Amel, MD;  Location: [facility] ENDOSCOPY;  Service: Endoscopy;  Laterality: Bilateral;    OB History   No obstetric history on file.      Home Medications    Prior to Admission medications   Medication Sig Start Date End Date Taking? Authorizing Provider  aspirin  EC 81 MG tablet Take 1 tablet (81 mg total) by mouth daily. 07/22/18  Yes Pietro Redell RAMAN, MD  azelastine  (ASTELIN ) 0.1 % nasal spray Place 2 sprays into both nostrils 2 (two) times daily. Use in each nostril as directed 11/27/23  Yes Geronimo Amel, MD  benzonatate  (TESSALON ) 100 MG capsule Take 1 capsule (100 mg total) by mouth  every 8 (eight) hours. 12/20/23  Yes Roshunda Keir K, PA-C  carboxymethylcellulose 1 % ophthalmic solution Apply 1 drop to eye as needed (dry eyes).   Yes [provider]  cholecalciferol (VITAMIN D3) 25 MCG (1000 UT) tablet Take 1,000 Units by mouth daily. Patient taking differently: Take 2,000 Units by mouth daily.   Yes [provider]  clobetasol  (TEMOVATE ) 0.05 % external solution Apply 1 Application topically 2 (two) times daily. 05/25/20  Yes [provider]  Cyanocobalamin  (VITAMIN B-12 PO) Take 2,500 mcg by mouth daily.   Yes [provider]  fluticasone  (FLONASE) 50 MCG/ACT nasal spray Place 1 spray into both nostrils daily.   Yes [provider]  hydroxychloroquine  (PLAQUENIL ) 200 MG tablet Take 1 tablet by mouth twice a day 02/07/22  Yes   loratadine (CLARITIN) 10 MG tablet Take 10 mg by mouth daily.   Yes [provider]  Magnesium  Oxide 250 MG TABS Take 500 mg by mouth daily.   Yes [provider]  nerandomilast ([name]) 18 MG tablet Take 18 mg by mouth in the morning and at bedtime. 12/02/23  Yes Geronimo Amel, MD  nirmatrelvir/ritonavir (PAXLOVID , 300/100,) 20 x 150 MG & 10 x 100MG  TBPK Take 3 tablets by mouth 2 (two) times daily for 5 days. Patient GFR is 73.06 ml/min. Take nirmatrelvir (150 mg) two tablets twice daily for 5 days and ritonavir (100 mg) one tablet twice daily for 5 days. 12/20/23 12/25/23 Yes Brigida Scotti K, PA-C  pravastatin  (PRAVACHOL ) 40 MG tablet TAKE 1 TABLET BY MOUTH DAILY GENERIC EQUIVALENT FOR PRAVACHOL  06/10/23  Yes Burns, Glade PARAS, MD  sodium chloride  (BRONCHO SALINE ) inhaler solution Take 1 spray by nebulization at bedtime.   Yes [provider]  SYNTHROID  88 MCG tablet TAKE 1 TABLET DAILY 30 MINUTES BEFORE BREAKFAST 6 DAYS A WEEK AND 1/2 TABLET ONE DAY A WEEK 09/14/23  Yes Burns, Glade PARAS, MD    Family History Family History  Problem Relation Age of Onset   Lung cancer Father         Asbestos with mesothelioma   Heart disease Paternal Grandmother        ? etiology   Diabetes Paternal Grandmother    Diabetes Brother    Uterine cancer Paternal Aunt    Stroke Paternal Grandfather        > 55   Heart attack Brother 76   Allergies Sister    Aneurysm Mother        congenital   Stroke Brother        two strokes   Barrett's esophagus Brother     Social History Social History   Tobacco Use   Smoking status: Never   Smokeless tobacco: Never  Vaping Use  Vaping status: Never Used  Substance Use Topics   Alcohol use: Yes    Comment:  rarely   Drug use: No     Allergies   Caffeine, Chlorpheniramine-pseudoeph, Maxifed, Other, and Pirfenidone    Review of Systems Review of Systems  Constitutional:  Positive for activity change. Negative for appetite change, fatigue and fever.  HENT:  Positive for congestion and sore throat. Negative for sinus pressure and sneezing.   Respiratory:  Positive for cough and shortness of breath.   Cardiovascular:  Negative for chest pain.  Gastrointestinal:  Negative for abdominal pain, diarrhea, nausea and vomiting.  Neurological:  Positive for headaches. Negative for dizziness and light-headedness.     Physical Exam Triage Vital Signs ED Triage Vitals  Encounter Vitals Group     BP 12/20/23 1347 (!) 107/59     Girls Systolic BP Percentile --      Girls Diastolic BP Percentile --      Boys Systolic BP Percentile --      Boys Diastolic BP Percentile --      Pulse Rate 12/20/23 1347 75     Resp 12/20/23 1347 18     Temp 12/20/23 1347 (!) 97.4 F (36.3 C)     Temp Source 12/20/23 1347 Oral     SpO2 12/20/23 1347 96 %     Weight --      Height --      Head Circumference --      Peak Flow --      Pain Score 12/20/23 1345 7     Pain Loc --      Pain Education --      Exclude from Growth Chart --    No data found.  Updated Vital Signs BP (!) 107/59 (BP Location: Left Arm)   Pulse 75   Temp (!) 97.4 F (36.3 C)  (Oral)   Resp 18   SpO2 96%   Visual Acuity Right Eye Distance:   Left Eye Distance:   Bilateral Distance:    Right Eye Near:   Left Eye Near:    Bilateral Near:     Physical Exam Vitals reviewed.  Constitutional:      General: She is awake. She is not in acute distress.    Appearance: Normal appearance. She is well-developed. She is not ill-appearing.     Comments: Very pleasant female appears stated age in no acute distress sitting comfortably in exam room patient  HENT:     Head: Normocephalic and atraumatic.     Right Ear: Tympanic membrane, ear canal and external ear normal. Tympanic membrane is not erythematous or bulging.     Left Ear: Tympanic membrane, ear canal and external ear normal. Tympanic membrane is not erythematous or bulging.     Nose:     Right Sinus: No maxillary sinus tenderness or frontal sinus tenderness.     Left Sinus: No maxillary sinus tenderness or frontal sinus tenderness.     Mouth/Throat:     Pharynx: Uvula midline. Postnasal drip present. No oropharyngeal exudate or posterior oropharyngeal erythema.  Cardiovascular:     Rate and Rhythm: Normal rate and regular rhythm.     Heart sounds: Normal heart sounds, S1 normal and S2 normal. No murmur heard. Pulmonary:     Effort: Pulmonary effort is normal.     Breath sounds: Examination of the right-lower field reveals decreased breath sounds. Examination of the left-lower field reveals decreased breath sounds. Decreased breath sounds present. No wheezing, rhonchi  or rales.  Psychiatric:        Behavior: Behavior is cooperative.      UC Treatments / Results  Labs (all labs ordered are listed, but only abnormal results are displayed) Labs Reviewed  POC COVID19/FLU A&B COMBO - Abnormal; Notable for the following components:      Result Value   Covid Antigen, POC Positive (*)    All other components within normal limits    EKG   Radiology DG Chest 2 View Result Date: 12/20/2023 EXAM: 2  VIEW(S) XRAY OF THE CHEST 12/20/2023 02:41:11 PM COMPARISON: 03/07/2023 CLINICAL HISTORY: acute cough FINDINGS: LUNGS AND PLEURA: Coarse reticular opacities throughout lungs consistent with interstitial fibrosis as seen on prior CT chest. No pleural effusion. No pneumothorax. HEART AND MEDIASTINUM: No acute abnormality of the cardiac and mediastinal silhouettes. BONES AND SOFT TISSUES: Degenerative changes in spine. IMPRESSION: 1. No acute findings. 2. Coarse reticular opacities consistent with interstitial fibrosis, similar to prior. Electronically signed by: Donnice Mania MD 12/20/2023 03:03 PM EST RP Workstation: [id number]    Procedures Procedures (including critical care time)  Medications Ordered in UC Medications - No data to display  Initial Impression / Assessment and Plan / UC Course  I have reviewed the triage vital signs and the nursing notes.  Pertinent labs & imaging results that were available during my care of the patient were reviewed by me and considered in my medical decision making (see chart for details).     Patient is well-appearing, afebrile, nontoxic, nontachycardic.  No evidence of acute infection on physical exam that warrant initiation of antibiotics.  COVID testing was positive.  Chest x-ray was obtained that showed no acute cardiopulmonary disease.  Given her age and comorbidities I did recommend antiviral therapy to help prevent complications related to COVID.  Patient is currently prescribed [name] for idiopathic pulmonary fibrosis and appears there is a possible drug interaction with decreased hepatic metabolism while taking Paxlovid leading to increased risk of side effects and so the recommendation is to decrease the dose of this medication to 9 mg twice daily.  I contacted the outpatient pharmacist who found similar medication interactions but recommended molnupiravir.  While I was investigating this, patient contacted her pulmonologist (Dr. Geronimo) who thought  that holding the [name] and using Paxlovid as COVID is likely to cause exacerbation of chronic lung condition and I was able to use epic chat to confirm his recommendation.  Patient has a recent metabolic panel from 07/22/2023 with creatinine of 0.76 and eGFR of 73.07 mL/min indicating no dose adjustment of the Paxlovid.  She will hold [name] while on this medication; her hydroxychloroquine  has a weak interaction potential interaction but per [facility] interaction checker no indication for dose adjustment.  She was given Tessalon  for cough.  Recommended she monitor her oxygen  saturation if this drops below 90% go to the ER.  She is to follow closely with her pulmonologist.  We discussed that if anything changes or worsen she should go to the ER for further evaluation given her age and comorbidities.  All questions were answered to patient and husband satisfaction and they expressed understanding and agreement to treatment plan.  Final Clinical Impressions(s) / UC Diagnoses   Final diagnoses:  Acute cough  COVID-19     Discharge Instructions      You tested positive for COVID.  Please take Tessalon  3 times a day.  You can use over-the-counter medication for additional symptom relief.  Start Paxlovid twice daily for 5 days  to help your body fight the virus.  Hold your [name] while on this medication.  Monitor your oxygen  saturation at home.  If this drops below 93% you need to return here.  If below 90% regularly you need to go to the emergency room.  If you have any shortness of breath, increasing cough, fever, nausea/vomiting, weakness you need to go to the ER immediately.     ED Prescriptions     Medication Sig Dispense Auth. Provider   benzonatate  (TESSALON ) 100 MG capsule Take 1 capsule (100 mg total) by mouth every 8 (eight) hours. 21 capsule Valor [place] K, PA-C   molnupiravir EUA (LAGEVRIO) 200 MG CAPS capsule  (Status: Discontinued) Take 4 capsules (800 mg total) by mouth 2 (two) times  daily for 5 days. 40 capsule Maxene Byington K, PA-C   nirmatrelvir/ritonavir (PAXLOVID, 300/100,) 20 x 150 MG & 10 x 100MG  TBPK Take 3 tablets by mouth 2 (two) times daily for 5 days. Patient GFR is 73.06 ml/min. Take nirmatrelvir (150 mg) two tablets twice daily for 5 days and ritonavir (100 mg) one tablet twice daily for 5 days. 30 tablet Jazper Nikolai K, PA-C      PDMP not reviewed this encounter.   Sherrell Rocky POUR, PA-C 12/20/23 1557

## 2023-12-20 NOTE — Discharge Instructions (Addendum)
 You tested positive for COVID.  Please take Tessalon  3 times a day.  You can use over-the-counter medication for additional symptom relief.  Start Paxlovid twice daily for 5 days to help your body fight the virus.  Hold your [name] while on this medication.  Monitor your oxygen  saturation at home.  If this drops below 93% you need to return here.  If below 90% regularly you need to go to the emergency room.  If you have any shortness of breath, increasing cough, fever, nausea/vomiting, weakness you need to go to the ER immediately.

## 2023-12-20 NOTE — ED Notes (Signed)
  at bedside

## 2023-12-20 NOTE — ED Triage Notes (Signed)
 Patient c/o head congestion, cough, sore throat and bilateral ear pain x 2 days.  Patient has used Navage OTC, Tylenol  and Mucinex.

## 2023-12-20 NOTE — Telephone Encounter (Signed)
 Call from patient Elizabeth Espinoza . Shje is UC. Covid +ve  P[;lan  - ok to hold [name] - more important to be on anti-viral during thios time

## 2023-12-31 NOTE — Progress Notes (Signed)
 Notified Dr. Alger office of patient's positive covid test on 11/28.  Patient stated that her symptoms resolved after approximately 5 days of positive test.  Per anesthesia, case will need to be reschedule - patient should be symptom free for 2 weeks.

## 2024-01-06 DIAGNOSIS — M1712 Unilateral primary osteoarthritis, left knee: Secondary | ICD-10-CM | POA: Diagnosis not present

## 2024-01-21 ENCOUNTER — Other Ambulatory Visit: Payer: Self-pay

## 2024-01-21 ENCOUNTER — Encounter (HOSPITAL_COMMUNITY): Payer: Self-pay | Admitting: General Surgery

## 2024-01-21 NOTE — Progress Notes (Signed)
 SDW CALL  Patient was given pre-op instructions over the phone. The opportunity was given for the patient to ask questions. No further questions asked. Patient verbalized understanding of instructions given.   PCP - Glade Hope Cardiologist - Redell Shallow - clearance 12/04/23  PPM/ICD -  Device Orders -  Rep Notified -   Chest x-ray - 12/20/23 EKG - 12/04/23 Stress Test - 02/13/18 ECHO - 07/02/23 Cardiac Cath - 07/18/23  Sleep Study - denies  Pre-diabetes - does not check blood sugar at home  Last dose of GLP1 agonist-  denies GLP1 instructions:  n/a  Blood Thinner Instructions: n/a Aspirin  Instructions: per cardiologist - patient can hold 5-7 days; patient states last dose is 12/31  ERAS Protcol - clears until 0430 PRE-SURGERY Ensure or G2- n/a  COVID TEST- n/a   Anesthesia review:  yes - non-obstructive CAD; ILD; MVP; pulmonary hypertension  Patient denies shortness of breath, fever, cough and chest pain over the phone call   All instructions explained to the patient, with a verbal understanding of the material. Patient agrees to go over the instructions while at home for a better understanding.

## 2024-01-21 NOTE — Progress Notes (Signed)
 Anesthesia Chart Review: Same day workup  83 year old female follows with cardiology for history of nonobstructive CAD, HLD, MVP, aortic atherosclerosis, PAD. She was seen 06/2022 with worsening exertional CP and SOB. She underwent [facility] 06/2023 demonstrated nonobstructive CAD and normal right and left heart filling pressures with mild to moderate pulmonary hypertension, mean PA 26 mmHg.  Seen by Jon Hails, PA-C on 12/04/2023 for preop evaluation.  Per note, She returns today for follow-up.  She is here for preoperative risk evaluation prior to lap chole. She denies angina, has been dealing with left upper abdominal pain related to cholecystectomy. Question if prior chest pain that prompted angiography was related to gallbladder issues. She is able to go to [place] and uses O2 while exercising  -she can do the recumbent bike and recumbent elliptical machine. 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 She still can complete 4.0 METS without angina. She has no obstructive CAD by heart cath 06/2023. No hx of stroke. She has no unstable cardiac conditions. She has a low risk of MACE and may proceed with surgery at acceptable risk without further cardiac testing.  She remains on ASA, which can be held for 5-7 days prior to surgery.  Follows with pulmonology for history of idiopathic pulmonary fibrosis.  Last seen by Dr. Geronimo 11/27/2023 and her IPF was noted to be stable/slightly progressive.  She had some intolerance to nintedanib and was switched to nerandomilast .  Upcoming surgery was also discussed.  Per note, lowmoderate risk for prolonged ventilator dependence. Plan - Any further questions surgeon Dr. Raymund can contact us  - Observe postoperatively in the ICU or stepdown status.  Recently had URI symptoms 12/20/2023, positive for COVID.  She was treated with Paxlovid .  She reports symptoms resolved  ~12/3.  Other pertinent history includes GERD, hiatal hernia, hypothyroidism.  She will need day of surgery labs and evaluation.  EKG 12/04/2023: NSR.  Rate 80.  ST and T wave abnormality, consider inferior ischemia.  2 VIEW(S) XRAY OF THE CHEST 12/20/2023: 12/20/2023 02:41:11 PM   COMPARISON: 03/07/2023   CLINICAL HISTORY: acute cough   FINDINGS:   LUNGS AND PLEURA: Coarse reticular opacities throughout lungs consistent with interstitial fibrosis as seen on prior CT chest. No pleural effusion. No pneumothorax.   HEART AND MEDIASTINUM: No acute abnormality of the cardiac and mediastinal silhouettes.   BONES AND SOFT TISSUES: Degenerative changes in spine.   IMPRESSION: 1. No acute findings. 2. Coarse reticular opacities consistent with interstitial fibrosis, similar to prior.  PFTs 11/27/2023: FVC-%Pred-Pre % 99  FEV1-%Pred-Pre % 104  FEV1FVC-%Pred-Pre % 106  DLCO unc % pred % 38   R/LHC 07/18/2023: Conclusions: Mild, non-obstructive coronary artery disease, as detailed below, including 20-30% stenosis of the proximal LAD and proximal/mid LCx, as well as mild luminal irregularities in the proximal RCA. Normal left and right heart filling pressures (RA 5, RVEDP 5, PCWP 8, LVEDP 8 mmHg). Mild to moderate pulmonary hypertension (PA 46/16, mean 26 mmHg; PVR 4 WU). Normal Fick cardiac output/index (CO 4.5 L/min, CI 2.7 L/min/m^2).   Recommendations: Medical therapy and risk factor modification to prevent progression of mild CAD. Ongoing follow-up with Dr. Geronimo for management of pulmonary hypertension and interstitial lung disease.  TTE 07/02/2023: 1. Left ventricular ejection fraction, by estimation, is 60 to 65%. Left  ventricular ejection fraction by 3D volume is 63 %. The left ventricle has  normal function. The left ventricle has no regional wall motion  abnormalities. Left ventricular diastolic   parameters were normal.   2. Right ventricular systolic  function is normal. The right ventricular  size is normal. There is mildly elevated pulmonary artery systolic  pressure. The estimated right ventricular systolic pressure is 41.7 mmHg.   3. A small pericardial effusion is present. The pericardial effusion is  localized near the left atrium. There is no evidence of cardiac tamponade.   4. The mitral valve is degenerative. No evidence of mitral valve  regurgitation. No evidence of mitral stenosis.   5. The aortic valve is tricuspid. Aortic valve regurgitation is trivial.  Aortic valve sclerosis is present, with no evidence of aortic valve  stenosis.   6. The inferior vena cava is normal in size with greater than 50%  respiratory variability, suggesting right atrial pressure of 3 mmHg.      Lynwood Geofm RIGGERS [facility] Short Stay Center/Anesthesiology Phone [phone] 01/21/2024 10:19 AM

## 2024-01-21 NOTE — Anesthesia Preprocedure Evaluation (Addendum)
 "                                  Anesthesia Evaluation  Patient identified by MRN, date of birth, ID band Patient awake    Reviewed: Allergy & Precautions, H&P , NPO status , Patient's Chart, lab work & pertinent test results  Airway Mallampati: II   Neck ROM: full    Dental   Pulmonary  Interstitial lung dz   breath sounds clear to auscultation       Cardiovascular + CAD and + Peripheral Vascular Disease   Rhythm:regular Rate:Normal     Neuro/Psych    GI/Hepatic hiatal hernia,GERD  ,,  Endo/Other  Hypothyroidism    Renal/GU      Musculoskeletal  (+) Arthritis ,    Abdominal   Peds  Hematology   Anesthesia Other Findings   Reproductive/Obstetrics                              Anesthesia Physical Anesthesia Plan  ASA: 3  Anesthesia Plan: General   Post-op Pain Management:    Induction: Intravenous  PONV Risk Score and Plan: 3 and Ondansetron , Dexamethasone , Midazolam  and Treatment may vary due to age or medical condition  Airway Management Planned: Oral ETT  Additional Equipment:   Intra-op Plan:   Post-operative Plan: Extubation in OR  Informed Consent: I have reviewed the patients History and Physical, chart, labs and discussed the procedure including the risks, benefits and alternatives for the proposed anesthesia with the patient or authorized representative who has indicated his/her understanding and acceptance.     Dental advisory given  Plan Discussed with: CRNA, Anesthesiologist and Surgeon  Anesthesia Plan Comments: (PAT note by Lynwood Hope, PA-C: 83 year old female follows with cardiology for history of nonobstructive CAD, HLD, MVP, aortic atherosclerosis, PAD. She was seen 06/2022 with worsening exertional CP and SOB. She underwent [facility] 06/2023 demonstrated nonobstructive CAD and normal right and left heart filling pressures with mild to moderate pulmonary hypertension, mean PA 26 mmHg.  Seen by  Jon Hails, PA-C on 12/04/2023 for preop evaluation.  Per note, She returns today for follow-up.  She is here for preoperative risk evaluation prior to lap chole. She denies angina, has been dealing with left upper abdominal pain related to cholecystectomy. Question if prior chest pain that prompted angiography was related to gallbladder issues. She is able to go to [place] and uses O2 while exercising  -she can do the recumbent bike and recumbent elliptical machine. 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 She still can complete 4.0 METS without angina. She has no obstructive CAD by heart cath 06/2023. No hx of stroke. She has no unstable cardiac conditions. She has a low risk of MACE and may proceed with surgery at acceptable risk without further cardiac testing.  She remains on ASA, which can be held for 5-7 days prior to surgery.  Follows with pulmonology for history of idiopathic pulmonary fibrosis.  Last seen by Dr. Geronimo 11/27/2023 and her IPF was noted to be stable/slightly progressive.  She had some intolerance to nintedanib and was switched to nerandomilast .  Upcoming surgery was also discussed.  Per note, lowmoderate risk for prolonged ventilator dependence. Plan - Any further questions surgeon Dr. Raymund can contact us  - Observe postoperatively in the ICU or stepdown  status.  Recently had URI symptoms 12/20/2023, positive for COVID.  She was treated with Paxlovid .  She reports symptoms resolved ~12/3.  Other pertinent history includes GERD, hiatal hernia, hypothyroidism.  She will need day of surgery labs and evaluation.  EKG 12/04/2023: NSR.  Rate 80.  ST and T wave abnormality, consider inferior ischemia.  2 VIEW(S) XRAY OF THE CHEST 12/20/2023: 12/20/2023 02:41:11 PM  COMPARISON: 03/07/2023  CLINICAL HISTORY: acute cough  FINDINGS:  LUNGS AND PLEURA: Coarse reticular  opacities throughout lungs consistent with interstitial fibrosis as seen on prior CT chest. No pleural effusion. No pneumothorax.  HEART AND MEDIASTINUM: No acute abnormality of the cardiac and mediastinal silhouettes.  BONES AND SOFT TISSUES: Degenerative changes in spine.  IMPRESSION: 1. No acute findings. 2. Coarse reticular opacities consistent with interstitial fibrosis, similar to prior.  PFTs 11/27/2023: FVC-%Pred-Pre % 99 FEV1-%Pred-Pre % 104 FEV1FVC-%Pred-Pre % 106 DLCO unc % pred % 38  R/LHC 07/18/2023: Conclusions: 1. Mild, non-obstructive coronary artery disease, as detailed below, including 20-30% stenosis of the proximal LAD and proximal/mid LCx, as well as mild luminal irregularities in the proximal RCA. 2. Normal left and right heart filling pressures (RA 5, RVEDP 5, PCWP 8, LVEDP 8 mmHg). 3. Mild to moderate pulmonary hypertension (PA 46/16, mean 26 mmHg; PVR 4 WU). 4. Normal Fick cardiac output/index (CO 4.5 L/min, CI 2.7 L/min/m^2).  Recommendations: 1. Medical therapy and risk factor modification to prevent progression of mild CAD. 2. Ongoing follow-up with Dr. Geronimo for management of pulmonary hypertension and interstitial lung disease.  TTE 07/02/2023: 1. Left ventricular ejection fraction, by estimation, is 60 to 65%. Left  ventricular ejection fraction by 3D volume is 63 %. The left ventricle has  normal function. The left ventricle has no regional wall motion  abnormalities. Left ventricular diastolic  parameters were normal.  2. Right ventricular systolic function is normal. The right ventricular  size is normal. There is mildly elevated pulmonary artery systolic  pressure. The estimated right ventricular systolic pressure is 41.7 mmHg.  3. A small pericardial effusion is present. The pericardial effusion is  localized near the left atrium. There is no evidence of cardiac tamponade.  4. The mitral valve is degenerative. No evidence of  mitral valve  regurgitation. No evidence of mitral stenosis.  5. The aortic valve is tricuspid. Aortic valve regurgitation is trivial.  Aortic valve sclerosis is present, with no evidence of aortic valve  stenosis.  6. The inferior vena cava is normal in size with greater than 50%  respiratory variability, suggesting right atrial pressure of 3 mmHg.     )         Anesthesia Quick Evaluation  "

## 2024-01-22 ENCOUNTER — Ambulatory Visit: Payer: Self-pay | Admitting: General Surgery

## 2024-01-27 NOTE — H&P (Signed)
 Chief Complaint: New Consultation (GALLBLADDER )       History of Present Illness: Elizabeth Espinoza is a 84 y.o. female who is seen today as an office consultation at the request of Dr. Kristie for evaluation of New Consultation (GALLBLADDER ) .     Patient is an 84 year old female, with history of pulmonary fibrosis recently off oxygen  who sees Dr. Geronimo. She states that she has had some right upper quadrant pain.  She discussed with Dr. Kristie.  She underwent ultrasound.  She this revealed no stones.  She also underwent HIDA scan.  This was significant for a low ejection fraction.  Patient was recommended to have cholecystectomy secondary to the ongoing pain.  She states that the pain is continued causing nausea vomiting.  She states that this has been increasing in time.  She has had no previous abdominal surgery.     Review of Systems: A complete review of systems was obtained from the patient.  I have reviewed this information and discussed as appropriate with the patient.  See HPI as well for other ROS.   Review of Systems  Constitutional:  Negative for fever.  HENT:  Negative for congestion.   Eyes:  Negative for blurred vision.  Respiratory:  Negative for cough, shortness of breath and wheezing.   Cardiovascular:  Negative for chest pain and palpitations.  Gastrointestinal:  Positive for abdominal pain, nausea and vomiting. Negative for heartburn.  Genitourinary:  Negative for dysuria.  Musculoskeletal:  Negative for myalgias.  Skin:  Negative for rash.  Neurological:  Negative for dizziness and headaches.  Psychiatric/Behavioral:  Negative for depression and suicidal ideas.   All other systems reviewed and are negative.       Medical History:     Past Medical History:  Diagnosis Date   Arthritis     GERD (gastroesophageal reflux disease)     Thyroid  disease        There is no problem list on file for this patient.          Past Surgical History:  Procedure  Laterality Date   HYSTERECTOMY               Allergies  Allergen Reactions   Chlorpheniramine Other (See Comments)   Covid-19 Vaccine, Mrna, [id number], Lnp-S Proofreader) Other (See Comments)   Guaifenesin Other (See Comments)   Pirfenidone  Other (See Comments) and Nausea      Esbriet   Causes nausea and weakness   Esbriet , Causes nausea and weakness   Pseudoephedrine Hcl Unknown   Pseudoephedrine-Guaifenesin Other (See Comments)      palpitations            Current Outpatient Medications on File Prior to Visit  Medication Sig Dispense Refill   aspirin  81 mg Cap ASPIRIN  81 MG       cholecalciferol (VITAMIN D3) 1000 unit tablet Take 1,000 Units by mouth once daily       clobetasoL  (CORMAX ) 0.05 % external solution APPLY TOPICALLY TO THE SCALP TWICE DAILY       cyanocobalamin , vitamin B-12, 1,000 mcg TbER Take 2,500 mcg by mouth once daily       hydroxychloroquine  (PLAQUENIL ) 200 mg tablet Take 200 mg by mouth 2 (two) times daily       levothyroxine  (SYNTHROID ) 88 MCG tablet         magnesium  oxide (MAG-OX) 400 mg (241.3 mg magnesium ) tablet Take 400 mg by mouth once daily       pravastatin  (PRAVACHOL ) 40  MG tablet TAKE 1 TABLET BY MOUTH DAILY GENERIC EQUIVALENT FOR PRAVACHOL         No current facility-administered medications on file prior to visit.      History reviewed. No pertinent family history.    Social History       Tobacco Use  Smoking Status Never  Smokeless Tobacco Never      Social History        Socioeconomic History   Marital status: Married  Tobacco Use   Smoking status: Never   Smokeless tobacco: Never  Vaping Use   Vaping status: Unknown  Substance and Sexual Activity   Alcohol use: Never   Drug use: Never    Social Drivers of Acupuncturist Strain: Low Risk  (03/21/2023)    Received from [facility]    Overall Financial Resource Strain (CARDIA)     Difficulty of Paying Living Expenses: Not hard at all  Food Insecurity: No  Food Insecurity (03/21/2023)    Received from [facility]    Hunger Vital Sign     Within the past 12 months, you worried that your food would run out before you got the money to buy more.: Never true     Within the past 12 months, the food you bought just didn't last and you didn't have money to get more.: Never true  Transportation Needs: No Transportation Needs (03/21/2023)    Received from [facility] - Transportation     Lack of Transportation (Medical): No     Lack of Transportation (Non-Medical): No  Physical Activity: Inactive (07/19/2023)    Received from [facility]    Exercise Vital Sign     On average, how many days per week do you engage in moderate to strenuous exercise (like a brisk walk)?: 0 days  Stress: No Stress Concern Present (07/19/2023)    Received from [facility] - Occupational Stress Questionnaire     Do you feel stress - tense, restless, nervous, or anxious, or unable to sleep at night because your mind is troubled all the time - these days?: Not at all  Social Connections: Moderately Integrated (07/19/2023)    Received from [facility]    Social Connection and Isolation Panel     In a typical week, how many times do you talk on the phone with family, friends, or neighbors?: More than three times a week     How often do you get together with friends or relatives?: Once a week     How often do you attend church or religious services?: Never     Do you belong to any clubs or organizations such as church groups, unions, fraternal or athletic groups, or school groups?: Yes     How often do you attend meetings of the clubs or organizations you belong to?: Never     Are you married, widowed, divorced, separated, never married, or living with a partner?: Married  Housing Stability: Unknown (11/27/2023)    Housing Stability Vital Sign     Homeless in the Last Year: No      Objective:      Body mass index is 21.43  kg/m.   Physical Exam Constitutional:      Appearance: Normal appearance.  HENT:     Head: Normocephalic and atraumatic.     Mouth/Throat:     Mouth: Mucous membranes are moist.  Pharynx: Oropharynx is clear.  Eyes:     General: No scleral icterus.    Pupils: Pupils are equal, round, and reactive to light.  Cardiovascular:     Rate and Rhythm: Normal rate and regular rhythm.     Pulses: Normal pulses.     Heart sounds: No murmur heard.    No friction rub. No gallop.  Pulmonary:     Effort: Pulmonary effort is normal. No respiratory distress.     Breath sounds: Normal breath sounds. No stridor.  Abdominal:     General: Abdomen is flat.  Musculoskeletal:        General: No swelling.  Skin:    General: Skin is warm.  Neurological:     General: No focal deficit present.     Mental Status: She is alert and oriented to person, place, and time. Mental status is at baseline.  Psychiatric:        Mood and Affect: Mood normal.        Thought Content: Thought content normal.        Judgment: Judgment normal.       Assessment and Plan:  Diagnoses and all orders for this visit:   Biliary dyskinesia     Elizabeth Espinoza is a 84 y.o. female     We will proceed to the OR for a lap cholecystectomy. All risks and benefits were discussed with the patient to generally include: infection, bleeding, possible need for post op ERCP, damage to the bile ducts, and bile leak. Alternatives were offered and described.  All questions were answered and the patient voiced understanding of the procedure and wishes to proceed at this point with a laparoscopic cholecystectomy           No follow-ups on file.   Lynda Leos, MD, [facility], [place] [facility]

## 2024-01-28 ENCOUNTER — Ambulatory Visit (HOSPITAL_COMMUNITY)
Admission: RE | Admit: 2024-01-28 | Discharge: 2024-01-28 | Disposition: A | Discharge status: Home / Self Care | Attending: General Surgery | Admitting: General Surgery

## 2024-01-28 ENCOUNTER — Encounter (HOSPITAL_COMMUNITY)
Admission: RE | Disposition: A | Discharge status: Home / Self Care | Payer: Self-pay | Source: Home / Self Care | Attending: General Surgery

## 2024-01-28 ENCOUNTER — Encounter (HOSPITAL_COMMUNITY): Payer: Self-pay | Admitting: General Surgery

## 2024-01-28 ENCOUNTER — Ambulatory Visit (HOSPITAL_BASED_OUTPATIENT_CLINIC_OR_DEPARTMENT_OTHER): Payer: Self-pay | Admitting: Physician Assistant

## 2024-01-28 ENCOUNTER — Encounter (HOSPITAL_COMMUNITY): Payer: Self-pay | Admitting: Physician Assistant

## 2024-01-28 ENCOUNTER — Other Ambulatory Visit: Payer: Self-pay

## 2024-01-28 DIAGNOSIS — E785 Hyperlipidemia, unspecified: Secondary | ICD-10-CM

## 2024-01-28 DIAGNOSIS — Z79890 Hormone replacement therapy (postmenopausal): Secondary | ICD-10-CM | POA: Insufficient documentation

## 2024-01-28 DIAGNOSIS — K219 Gastro-esophageal reflux disease without esophagitis: Secondary | ICD-10-CM | POA: Diagnosis not present

## 2024-01-28 DIAGNOSIS — K828 Other specified diseases of gallbladder: Secondary | ICD-10-CM

## 2024-01-28 DIAGNOSIS — Z7982 Long term (current) use of aspirin: Secondary | ICD-10-CM | POA: Insufficient documentation

## 2024-01-28 DIAGNOSIS — M1990 Unspecified osteoarthritis, unspecified site: Secondary | ICD-10-CM | POA: Diagnosis not present

## 2024-01-28 DIAGNOSIS — I739 Peripheral vascular disease, unspecified: Secondary | ICD-10-CM | POA: Insufficient documentation

## 2024-01-28 DIAGNOSIS — K449 Diaphragmatic hernia without obstruction or gangrene: Secondary | ICD-10-CM | POA: Insufficient documentation

## 2024-01-28 DIAGNOSIS — I2510 Atherosclerotic heart disease of native coronary artery without angina pectoris: Secondary | ICD-10-CM | POA: Diagnosis not present

## 2024-01-28 DIAGNOSIS — E039 Hypothyroidism, unspecified: Secondary | ICD-10-CM | POA: Insufficient documentation

## 2024-01-28 DIAGNOSIS — K811 Chronic cholecystitis: Secondary | ICD-10-CM | POA: Insufficient documentation

## 2024-01-28 DIAGNOSIS — J8410 Pulmonary fibrosis, unspecified: Secondary | ICD-10-CM | POA: Insufficient documentation

## 2024-01-28 DIAGNOSIS — I272 Pulmonary hypertension, unspecified: Secondary | ICD-10-CM | POA: Insufficient documentation

## 2024-01-28 DIAGNOSIS — Z79899 Other long term (current) drug therapy: Secondary | ICD-10-CM | POA: Diagnosis not present

## 2024-01-28 HISTORY — PX: CHOLECYSTECTOMY: SHX55

## 2024-01-28 HISTORY — DX: Pulmonary hypertension, unspecified: I27.20

## 2024-01-28 HISTORY — DX: Prediabetes: R73.03

## 2024-01-28 HISTORY — DX: Unspecified osteoarthritis, unspecified site: M19.90

## 2024-01-28 HISTORY — DX: Atherosclerotic heart disease of native coronary artery without angina pectoris: I25.10

## 2024-01-28 LAB — CBC
HCT: 39.5 % (ref 36.0–46.0)
Hemoglobin: 12.7 g/dL (ref 12.0–15.0)
MCH: 30.7 pg (ref 26.0–34.0)
MCHC: 32.2 g/dL (ref 30.0–36.0)
MCV: 95.4 fL (ref 80.0–100.0)
Platelets: 190 K/uL (ref 150–400)
RBC: 4.14 MIL/uL (ref 3.87–5.11)
RDW: 14.5 % (ref 11.5–15.5)
WBC: 7.1 K/uL (ref 4.0–10.5)
nRBC: 0 % (ref 0.0–0.2)

## 2024-01-28 LAB — BASIC METABOLIC PANEL WITH GFR
Anion gap: 8 (ref 5–15)
BUN: 11 mg/dL (ref 8–23)
CO2: 25 mmol/L (ref 22–32)
Calcium: 9 mg/dL (ref 8.9–10.3)
Chloride: 108 mmol/L (ref 98–111)
Creatinine, Ser: 0.77 mg/dL (ref 0.44–1.00)
GFR, Estimated: >60 mL/min
Glucose, Bld: 90 mg/dL (ref 70–99)
Potassium: 4.4 mmol/L (ref 3.5–5.1)
Sodium: 141 mmol/L (ref 135–145)

## 2024-01-28 SURGERY — LAPAROSCOPIC CHOLECYSTECTOMY
Anesthesia: General | Site: Abdomen

## 2024-01-28 MED ORDER — FENTANYL CITRATE (PF) 100 MCG/2ML IJ SOLN
25.0000 ug | INTRAMUSCULAR | Status: DC | PRN
  Administered 2024-01-28 (×2): 25 ug via INTRAVENOUS

## 2024-01-28 MED ORDER — EPHEDRINE SULFATE-NACL 50-0.9 MG/10ML-% IV SOSY
PREFILLED_SYRINGE | INTRAVENOUS | Status: DC | PRN
  Administered 2024-01-28: 5 mg via INTRAVENOUS

## 2024-01-28 MED ORDER — OXYCODONE HCL 5 MG PO TABS
ORAL_TABLET | ORAL | Status: AC
  Filled 2024-01-28: qty 1

## 2024-01-28 MED ORDER — LACTATED RINGERS IV SOLN: INTRAVENOUS | Status: DC

## 2024-01-28 MED ORDER — BUPIVACAINE HCL (PF) 0.25 % IJ SOLN
INTRAMUSCULAR | Status: AC
  Filled 2024-01-28: qty 30

## 2024-01-28 MED ORDER — CHLORHEXIDINE GLUCONATE 0.12 % MT SOLN
15.0000 mL | Freq: Once | OROMUCOSAL | Status: AC
  Administered 2024-01-28: 15 mL via OROMUCOSAL
  Filled 2024-01-28: qty 15

## 2024-01-28 MED ORDER — CHLORHEXIDINE GLUCONATE CLOTH 2 % EX PADS: 6.0000 | MEDICATED_PAD | Freq: Once | CUTANEOUS | Status: DC

## 2024-01-28 MED ORDER — SODIUM CHLORIDE 0.9 % IR SOLN
Status: DC | PRN
  Administered 2024-01-28: 1000 mL

## 2024-01-28 MED ORDER — PROPOFOL 10 MG/ML IV BOLUS
INTRAVENOUS | Status: AC
  Filled 2024-01-28: qty 20

## 2024-01-28 MED ORDER — DEXAMETHASONE SOD PHOSPHATE PF 10 MG/ML IJ SOLN
INTRAMUSCULAR | Status: DC | PRN
  Administered 2024-01-28: 5 mg via INTRAVENOUS

## 2024-01-28 MED ORDER — SUGAMMADEX SODIUM 200 MG/2ML IV SOLN
INTRAVENOUS | Status: DC | PRN
  Administered 2024-01-28: 200 mg via INTRAVENOUS

## 2024-01-28 MED ORDER — BUPIVACAINE HCL 0.25 % IJ SOLN
INTRAMUSCULAR | Status: DC | PRN
  Administered 2024-01-28: 8 mL

## 2024-01-28 MED ORDER — LIDOCAINE 2% (20 MG/ML) 5 ML SYRINGE
INTRAMUSCULAR | Status: DC | PRN
  Administered 2024-01-28: 40 mg via INTRAVENOUS

## 2024-01-28 MED ORDER — FENTANYL CITRATE (PF) 100 MCG/2ML IJ SOLN
INTRAMUSCULAR | Status: AC
  Filled 2024-01-28: qty 2

## 2024-01-28 MED ORDER — ACETAMINOPHEN 500 MG PO TABS
1000.0000 mg | ORAL_TABLET | ORAL | Status: AC
  Administered 2024-01-28: 1000 mg via ORAL
  Filled 2024-01-28: qty 2

## 2024-01-28 MED ORDER — ORAL CARE MOUTH RINSE: 15.0000 mL | Freq: Once | OROMUCOSAL | Status: AC

## 2024-01-28 MED ORDER — CEFAZOLIN SODIUM-DEXTROSE 2-4 GM/100ML-% IV SOLN
2.0000 g | INTRAVENOUS | Status: AC
  Administered 2024-01-28: 2 g via INTRAVENOUS
  Filled 2024-01-28: qty 100

## 2024-01-28 MED ORDER — FENTANYL CITRATE (PF) 250 MCG/5ML IJ SOLN
INTRAMUSCULAR | Status: DC | PRN
  Administered 2024-01-28 (×2): 50 ug via INTRAVENOUS

## 2024-01-28 MED ORDER — ROCURONIUM BROMIDE 10 MG/ML (PF) SYRINGE
PREFILLED_SYRINGE | INTRAVENOUS | Status: DC | PRN
  Administered 2024-01-28: 40 mg via INTRAVENOUS

## 2024-01-28 MED ORDER — OXYCODONE HCL 5 MG/5ML PO SOLN: 5.0000 mg | Freq: Once | ORAL | Status: AC | PRN

## 2024-01-28 MED ORDER — ONDANSETRON HCL 4 MG/2ML IJ SOLN
INTRAMUSCULAR | Status: DC | PRN
  Administered 2024-01-28: 4 mg via INTRAVENOUS

## 2024-01-28 MED ORDER — ENSURE PRE-SURGERY PO LIQD: 296.0000 mL | Freq: Once | ORAL | Status: DC

## 2024-01-28 MED ORDER — 0.9 % SODIUM CHLORIDE (POUR BTL) OPTIME
TOPICAL | Status: DC | PRN
  Administered 2024-01-28: 1000 mL

## 2024-01-28 MED ORDER — PROPOFOL 10 MG/ML IV BOLUS
INTRAVENOUS | Status: DC | PRN
  Administered 2024-01-28: 80 mg via INTRAVENOUS

## 2024-01-28 MED ORDER — DEXMEDETOMIDINE HCL IN NACL 80 MCG/20ML IV SOLN
INTRAVENOUS | Status: DC | PRN
  Administered 2024-01-28: 4 ug via INTRAVENOUS

## 2024-01-28 MED ORDER — TRAMADOL HCL 50 MG PO TABS: 50.0000 mg | ORAL_TABLET | Freq: Four times a day (QID) | ORAL | 0 refills | Status: AC | PRN

## 2024-01-28 MED ORDER — ONDANSETRON HCL 4 MG/2ML IJ SOLN: 4.0000 mg | Freq: Four times a day (QID) | INTRAMUSCULAR | Status: DC | PRN

## 2024-01-28 MED ORDER — OXYCODONE HCL 5 MG PO TABS
5.0000 mg | ORAL_TABLET | Freq: Once | ORAL | Status: AC | PRN
  Administered 2024-01-28: 5 mg via ORAL

## 2024-01-28 SURGICAL SUPPLY — 34 items
BAG COUNTER SPONGE SURGICOUNT (BAG) ×1 IMPLANT
CANISTER SUCTION 3000ML PPV (SUCTIONS) ×1 IMPLANT
CHLORAPREP W/TINT 26 (MISCELLANEOUS) ×1 IMPLANT
CLIP LIGATING HEMO O LOK GREEN (MISCELLANEOUS) ×1 IMPLANT
COVER SURGICAL LIGHT HANDLE (MISCELLANEOUS) ×1 IMPLANT
COVER TRANSDUCER ULTRASND (DRAPES) ×1 IMPLANT
DERMABOND ADVANCED .7 DNX12 (GAUZE/BANDAGES/DRESSINGS) ×1 IMPLANT
ELECTRODE REM PT RTRN 9FT ADLT (ELECTROSURGICAL) ×1 IMPLANT
ENDOLOOP SUT PDS II 0 18 (SUTURE) IMPLANT
GLOVE BIO SURGEON STRL SZ7.5 (GLOVE) ×2 IMPLANT
GOWN STRL REUS W/ TWL LRG LVL3 (GOWN DISPOSABLE) ×2 IMPLANT
GOWN STRL REUS W/ TWL XL LVL3 (GOWN DISPOSABLE) ×1 IMPLANT
GRASPER SUT TROCAR 14GX15 (MISCELLANEOUS) ×1 IMPLANT
IRRIGATION SUCT STRKRFLW 2 WTP (MISCELLANEOUS) ×1 IMPLANT
KIT BASIN OR (CUSTOM PROCEDURE TRAY) ×1 IMPLANT
KIT IMAGING PINPOINTPAQ (MISCELLANEOUS) IMPLANT
KIT TURNOVER KIT B (KITS) ×1 IMPLANT
NEEDLE INSUFFLATION 14GA 120MM (NEEDLE) ×1 IMPLANT
PAD ARMBOARD POSITIONER FOAM (MISCELLANEOUS) ×1 IMPLANT
POUCH LAPAROSCOPIC INSTRUMENT (MISCELLANEOUS) ×1 IMPLANT
POUCH RETRIEVAL ECOSAC 10 (ENDOMECHANICALS) IMPLANT
SCISSORS LAP 5X35 DISP (ENDOMECHANICALS) ×1 IMPLANT
SET TUBE SMOKE EVAC HIGH FLOW (TUBING) ×1 IMPLANT
SLEEVE Z-THREAD 5X100MM (TROCAR) ×1 IMPLANT
SOLN 0.9% NACL POUR BTL 1000ML (IV SOLUTION) ×1 IMPLANT
SOLN STERILE WATER BTL 1000 ML (IV SOLUTION) ×1 IMPLANT
SUT MNCRL AB 4-0 PS2 18 (SUTURE) ×1 IMPLANT
SUT VICRYL 0 UR6 27IN ABS (SUTURE) IMPLANT
TOWEL GREEN STERILE (TOWEL DISPOSABLE) ×1 IMPLANT
TOWEL GREEN STERILE FF (TOWEL DISPOSABLE) ×1 IMPLANT
TRAY LAPAROSCOPIC MC (CUSTOM PROCEDURE TRAY) ×1 IMPLANT
TROCAR 11X100 Z THREAD (TROCAR) ×1 IMPLANT
TROCAR Z-THREAD OPTICAL 5X100M (TROCAR) ×1 IMPLANT
WARMER LAPAROSCOPE (MISCELLANEOUS) ×1 IMPLANT

## 2024-01-28 NOTE — Anesthesia Postprocedure Evaluation (Signed)
"   Anesthesia Post Note  Patient: Elizabeth Espinoza  Procedure(s) Performed: LAPAROSCOPIC CHOLECYSTECTOMY (Abdomen)     Patient location during evaluation: PACU Anesthesia Type: General Level of consciousness: awake and alert Pain management: pain level controlled Vital Signs Assessment: post-procedure vital signs reviewed and stable Respiratory status: spontaneous breathing, nonlabored ventilation, respiratory function stable and patient connected to nasal cannula oxygen  Cardiovascular status: blood pressure returned to baseline and stable Postop Assessment: no apparent nausea or vomiting Anesthetic complications: no   No notable events documented.  Last Vitals:  Vitals:   01/28/24 0845 01/28/24 0900  BP: (!) 117/55 (!) 120/55  Pulse: (!) 49 (!) 51  Resp: 12 14  Temp:  36.5 C  SpO2: 100% 96%    Last Pain:  Vitals:   01/28/24 0840  TempSrc:   PainSc: Asleep                 Luiza Carranco S      "

## 2024-01-28 NOTE — Transfer of Care (Signed)
 Immediate Anesthesia Transfer of Care Note  Patient: Elizabeth Espinoza  Procedure(s) Performed: LAPAROSCOPIC CHOLECYSTECTOMY (Abdomen)  Patient Location: PACU  Anesthesia Type:General  Level of Consciousness: awake, alert , and oriented  Airway & Oxygen  Therapy: Patient Spontanous Breathing  Post-op Assessment: Report given to RN and Post -op Vital signs reviewed and stable  Post vital signs: Reviewed and stable  Last Vitals:  Vitals Value Taken Time  BP 131/66   Temp    Pulse 57   Resp 12   SpO2 94     Last Pain:  Vitals:   01/28/24 0621  TempSrc:   PainSc: 5       Patients Stated Pain Goal: 4 (01/28/24 9378)  Complications: No notable events documented.

## 2024-01-28 NOTE — Discharge Instructions (Signed)
 [facility] ______[facility] LAPAROSCOPIC SURGERY: POST OP INSTRUCTIONS Always review your discharge instruction sheet given to you by the facility where your surgery was performed. IF YOU HAVE DISABILITY OR FAMILY LEAVE FORMS, YOU MUST BRING THEM TO THE OFFICE FOR PROCESSING.   DO NOT GIVE THEM TO YOUR DOCTOR.  A prescription for pain medication may be given to you upon discharge.  Take your pain medication as prescribed, if needed.  If narcotic pain medicine is not needed, then you may take acetaminophen  (Tylenol ) or ibuprofen  (Advil ) as needed. Take your usually prescribed medications unless otherwise directed. If you need a refill on your pain medication, please contact your pharmacy.  They will contact our office to request authorization. Prescriptions will not be filled after 5pm or on week-ends. You should follow a light diet the first few days after arrival home, such as soup and crackers, etc.  Be sure to include lots of fluids daily. Most patients will experience some swelling and bruising in the area of the incisions.  Ice packs will help.  Swelling and bruising can take several days to resolve.  It is common to experience some constipation if taking pain medication after surgery.  Increasing fluid intake and taking a stool softener (such as Colace) will usually help or prevent this problem from occurring.  A mild laxative (Milk of Magnesia or Miralax) should be taken according to package instructions if there are no bowel movements after 48 hours. Unless discharge instructions indicate otherwise, you may remove your bandages 24-48 hours after surgery, and you may shower at that time.  You may have steri-strips (small skin tapes) in place directly over the incision.  These strips should be left on the skin for 7-10 days.  If your surgeon used skin glue on the incision, you may shower in 24 hours.  The glue will flake off over the next 2-3 weeks.  Any sutures or staples will be  removed at the office during your follow-up visit. ACTIVITIES:  You may resume regular (light) daily activities beginning the next day--such as daily self-care, walking, climbing stairs--gradually increasing activities as tolerated.  You may have sexual intercourse when it is comfortable.  Refrain from any heavy lifting or straining until approved by your doctor. You may drive when you are no longer taking prescription pain medication, you can comfortably wear a seatbelt, and you can safely maneuver your car and apply brakes. RETURN TO WORK:  __________________________________________________________ Rosine should see your doctor in the office for a follow-up appointment approximately 2-3 weeks after your surgery.  Make sure that you call for this appointment within a day or two after you arrive home to insure a convenient appointment time. OTHER INSTRUCTIONS: __________________________________________________________________________________________________________________________ __________________________________________________________________________________________________________________________ WHEN TO CALL YOUR DOCTOR: Fever over 101.0 Inability to urinate Continued bleeding from incision. Increased pain, redness, or drainage from the incision. Increasing abdominal pain  The clinic staff is available to answer your questions during regular business hours.  Please don't hesitate to call and ask to speak to one of the nurses for clinical concerns.  If you have a medical emergency, go to the nearest emergency room or call 911.  A surgeon from [facility] is always on call at the hospital. [address], [address], [place], [place]  [address] ? [address] [address], [place], [place]   [address] [phone] ? [phone] ? FAX [phone] Web site: [email or web address]

## 2024-01-28 NOTE — Op Note (Signed)
 01/28/2024  7:55 AM  PATIENT:  Elizabeth Espinoza  84 y.o. female  PRE-OPERATIVE DIAGNOSIS:  BILIARY DYSKINESIA  POST-OPERATIVE DIAGNOSIS:  BILIARY DYSKINESIA  PROCEDURE:  Procedures with comments: LAPAROSCOPIC CHOLECYSTECTOMY (N/A) - LAPAROSCOPIC CHOLECYSTECTOMY  SURGEON:  Surgeons and Role:    DEWAINE Rubin Calamity, MD - Primary  ASSISTANTS: none   ANESTHESIA:   local and general  EBL:  minimal   BLOOD ADMINISTERED:none  DRAINS: none   LOCAL MEDICATIONS USED:  MARCAINE      SPECIMEN:  Source of Specimen:  gallbladder  DISPOSITION OF SPECIMEN:  PATHOLOGY  COUNTS:  YES  TOURNIQUET:  * No tourniquets in log *  DICTATION: .Dragon Dictation  The patient was taken to the operating and placed in the supine position with bilateral SCDs in place.  The patient was prepped and draped in the usual sterile fashion. A time out was called and all facts were verified. A pneumoperitoneum was obtained via A Veress needle technique to a pressure of 14mm of mercury.  A 5mm trochar was then placed in the right upper quadrant under visualization, and there were no injuries to any abdominal organs. A 11 mm port was then placed in the umbilical region after infiltrating with local anesthesia under direct visualization. A second and third epigastric port and right lower quadrant port placement under direct visualization, respectively.    The gallbladder was identified and retracted, the peritoneum was then sharply dissected from the gallbladder and this dissection was carried down to Calot's triangle. The cystic duct was identified and stripped away circumferentially and seen going into the gallbladder 360, the critical angle was obtained.  2 clips were placed proximally one distally and the cystic duct transected. The cystic artery was identified and 2 clips placed proximally and one distally and transected.  We then proceeded to remove the gallbladder off the hepatic fossa with Bovie cautery. A  retrieval bag was then placed in the abdomen and gallbladder placed in the bag. The hepatic fossa was then reexamined and hemostasis was achieved with Bovie cautery and was excellent at the end of the case.   The subhepatic fossa and perihepatic fossa was then irrigated until the effluent was clear.  The gallbladder and bag were removed from the abdominal cavity. The 11 mm trocar fascia was reapproximated with the Endo Close #1 Vicryl x.  The pneumoperitoneum was evacuated and all trochars removed under direct visulalization.  The skin was then closed with 4-0 Monocryl and the skin dressed with Dermabond.    The patient was awaken from general anesthesia and taken to the recovery room in stable condition.   PLAN OF CARE: Discharge to home after PACU  PATIENT DISPOSITION:  PACU - hemodynamically stable.   Delay start of Pharmacological VTE agent (>24hrs) due to surgical blood loss or risk of bleeding: not applicable

## 2024-01-28 NOTE — Anesthesia Procedure Notes (Signed)
 Procedure Name: Intubation Date/Time: 01/28/2024 7:29 AM  Performed by: Virgil Ee, CRNAPre-anesthesia Checklist: Patient identified, Patient being monitored, Timeout performed, Emergency Drugs available and Suction available Patient Re-evaluated:Patient Re-evaluated prior to induction Oxygen  Delivery Method: Circle system utilized Preoxygenation: Pre-oxygenation with 100% oxygen  Induction Type: IV induction Ventilation: Mask ventilation without difficulty Laryngoscope Size: Miller and 2 Grade View: Grade II Tube type: Oral Tube size: 6.5 mm Number of attempts: 1 Airway Equipment and Method: Stylet Placement Confirmation: ETT inserted through vocal cords under direct vision, positive ETCO2 and breath sounds checked- equal and bilateral Secured at: 21 cm Tube secured with: Tape Dental Injury: Teeth and Oropharynx as per pre-operative assessment

## 2024-01-28 NOTE — Interval H&P Note (Signed)
 History and Physical Interval Note:  01/28/2024 6:56 AM  Elizabeth Espinoza  has presented today for surgery, with the diagnosis of BILIARY DYSKINESIA.  The various methods of treatment have been discussed with the patient and family. After consideration of risks, benefits and other options for treatment, the patient has consented to  Procedures with comments: LAPAROSCOPIC CHOLECYSTECTOMY (N/A) - LAPAROSCOPIC CHOLECYSTECTOMY as a surgical intervention.  The patient's history has been reviewed, patient examined, no change in status, stable for surgery.  I have reviewed the patient's chart and labs.  Questions were answered to the patient's satisfaction.     Marjean Imperato

## 2024-01-29 ENCOUNTER — Encounter (HOSPITAL_COMMUNITY): Payer: Self-pay | Admitting: General Surgery

## 2024-01-29 LAB — SURGICAL PATHOLOGY

## 2024-02-03 NOTE — Progress Notes (Unsigned)
 "    HPI: FU dyspnea and coronary calcification. CPX January 2020 showed normal functional capacity for age with duration 8:45; no ST changes; patient complained of chest pain, dyspnea and dizziness; there were other abnormal parameters suggesting cardiovascular limitation and also pulmonary impairment. ABIs May 2024 normal.  Monitor October 2024 showed sinus rhythm with occasional PAC, short runs of SVT, PVC and rare couplet.  She did not tolerate metoprolol .  Chest CT May 2025 showed no pulmonary embolus but there was note of pulmonary fibrosis.  Echocardiogram June 2025 showed normal LV function, trace aortic insufficiency and small pericardial effusion.  Right and left cardiac catheterization June 2025 showed mild nonobstructive coronary disease and normal filling pressures.  Since last seen   Current Outpatient Medications  Medication Sig Dispense Refill   aspirin  EC 81 MG tablet Take 1 tablet (81 mg total) by mouth daily. 90 tablet 3   azelastine  (ASTELIN ) 0.1 % nasal spray Place 2 sprays into both nostrils 2 (two) times daily. Use in each nostril as directed (Patient taking differently: Place 2 sprays into both nostrils at bedtime. Use in each nostril as directed) 30 mL 12   benzonatate  (TESSALON ) 100 MG capsule Take 1 capsule (100 mg total) by mouth every 8 (eight) hours. 21 capsule 0   carboxymethylcellulose 1 % ophthalmic solution Apply 1 drop to eye as needed (dry eyes).     cholecalciferol (VITAMIN D3) 25 MCG (1000 UT) tablet Take 1,000 Units by mouth daily.     clobetasol  (TEMOVATE ) 0.05 % external solution Apply 1 Application topically 2 (two) times daily.     Cyanocobalamin  (VITAMIN B-12 PO) Take 2,500 mcg by mouth daily.     fluticasone  (FLONASE) 50 MCG/ACT nasal spray Place 1 spray into both nostrils in the morning.     hydroxychloroquine  (PLAQUENIL ) 200 MG tablet Take 1 tablet by mouth twice a day 60 tablet 9   loratadine (CLARITIN) 10 MG tablet Take 10 mg by mouth daily.      Magnesium  Oxide 250 MG TABS Take 500 mg by mouth daily.     nerandomilast  ([name] ) 18 MG tablet Take 18 mg by mouth in the morning and at bedtime. 60 tablet 3   pravastatin  (PRAVACHOL ) 40 MG tablet TAKE 1 TABLET BY MOUTH DAILY GENERIC EQUIVALENT FOR PRAVACHOL  90 tablet 2   SYNTHROID  88 MCG tablet TAKE 1 TABLET DAILY 30 MINUTES BEFORE BREAKFAST 6 DAYS A WEEK AND 1/2 TABLET ONE DAY A WEEK     traMADol  (ULTRAM ) 50 MG tablet Take 1 tablet (50 mg total) by mouth every 6 (six) hours as needed. 20 tablet 0   No current facility-administered medications for this visit.     Past Medical History:  Diagnosis Date   Allergic rhinitis    Arthritis    CAD (coronary artery disease)    non-obstructive   GERD (gastroesophageal reflux disease)    Glaucoma    SUSPECT   Hiatal hernia    Hyperlipidemia    LDL goal = < 100   Hypothyroidism    ILD (interstitial lung disease) (HCC)    Lichen planopilaris    MVP (mitral valve prolapse)    documented on 2 D ECHO   Pre-diabetes    Pulmonary hypertension (HCC)    Restless legs    Urticaria     Past Surgical History:  Procedure Laterality Date   APPENDECTOMY     with TAH   CARDIAC CATHETERIZATION  1989   negative   CHOLECYSTECTOMY N/A 01/28/2024  Procedure: LAPAROSCOPIC CHOLECYSTECTOMY;  Surgeon: Rubin Calamity, MD;  Location: [facility] OR;  Service: General;  Laterality: N/A;  LAPAROSCOPIC CHOLECYSTECTOMY   COLONOSCOPY  2005   Dr Obie   COLONOSCOPY  01/2013   Dr kristie   esophageal dilation  07/04/2011   Dr Obie SIDE SIGMOIDOSCOPY  1999    Dr Obie   KNEE ARTHROSCOPY  2001, 2005   Dr Beverley   bilat   RIGHT/LEFT HEART CATH AND CORONARY ANGIOGRAPHY N/A 07/18/2023   Procedure: RIGHT/LEFT HEART CATH AND CORONARY ANGIOGRAPHY;  Surgeon: Mady Bruckner, MD;  Location: [facility] INVASIVE CV LAB;  Service: Cardiovascular;  Laterality: N/A;   TOTAL ABDOMINAL HYSTERECTOMY     for Endometriosis; no BSO, Dr Janyth   UPPER GI ENDOSCOPY  01/2013   Dr  Kristie   VIDEO BRONCHOSCOPY Bilateral 02/05/2019   Procedure: VIDEO BRONCHOSCOPY WITH FLUORO;  Surgeon: Geronimo Amel, MD;  Location: [facility] ENDOSCOPY;  Service: Endoscopy;  Laterality: Bilateral;    Social History   Socioeconomic History   Marital status: Married    Spouse name: Lynwood   Number of children: 2   Years of education: Not on file   Highest education level: 12th grade  Occupational History   Occupation: retired from [employer] retired  Tobacco Use   Smoking status: Never   Smokeless tobacco: Never  Vaping Use   Vaping status: Never Used  Substance and Sexual Activity   Alcohol use: Not Currently    Comment:  rarely   Drug use: No   Sexual activity: Yes  Other Topics Concern   Not on file  Social History Narrative   Live at home with husband   Social Drivers of Health   Tobacco Use: Low Risk (01/28/2024)   Patient History    Smoking Tobacco Use: Never    Smokeless Tobacco Use: Never    Passive Exposure: Not on file  Financial Resource Strain: Low Risk (03/21/2023)   Overall Financial Resource Strain (CARDIA)    Difficulty of Paying Living Expenses: Not hard at all  Food Insecurity: No Food Insecurity (03/21/2023)   Hunger Vital Sign    Worried About Running Out of Food in the Last Year: Never true    Ran Out of Food in the Last Year: Never true  Transportation Needs: No Transportation Needs (03/21/2023)   PRAPARE - Administrator, Civil Service (Medical): No    Lack of Transportation (Non-Medical): No  Physical Activity: Inactive (07/19/2023)   Exercise Vital Sign    Days of Exercise per Week: 0 days    Minutes of Exercise per Session: Not on file  Stress: No Stress Concern Present (07/19/2023)   [employer] of Occupational Health - Occupational Stress Questionnaire    Feeling of Stress: Not at all  Social Connections: Moderately Integrated (07/19/2023)   Social Connection and Isolation Panel    Frequency of Communication with Friends and  Family: More than three times a week    Frequency of Social Gatherings with Friends and Family: Once a week    Attends Religious Services: Never    Database Administrator or Organizations: Yes    Attends Banker Meetings: Never    Marital Status: Married  Catering Manager Violence: Not At Risk (03/28/2023)   Humiliation, Afraid, Rape, and Kick questionnaire    Fear of Current or Ex-Partner: No    Emotionally Abused: No    Physically Abused: No    Sexually Abused: No  Depression (PHQ2-9): Low  Risk (07/22/2023)   Depression (PHQ2-9)    PHQ-2 Score: 1  Alcohol Screen: Low Risk (03/21/2023)   Alcohol Screen    Last Alcohol Screening Score (AUDIT): 0  Housing: Unknown (11/27/2023)   Received from [facility]   Epic    Unable to Pay for Housing in the Last Year: Not on file    Number of Times Moved in the Last Year: Not on file    At any time in the past 12 months, were you homeless or living in a shelter (including now)?: No  Utilities: Not At Risk (03/28/2023)   [employer] Utilities    Threatened with loss of utilities: No  Health Literacy: Adequate Health Literacy (03/28/2023)   B1300 Health Literacy    Frequency of need for help with medical instructions: Never    Family History  Problem Relation Age of Onset   Lung cancer Father        Asbestos with mesothelioma   Heart disease Paternal Grandmother        ? etiology   Diabetes Paternal Grandmother    Diabetes Brother    Uterine cancer Paternal Aunt    Stroke Paternal Grandfather        > 55   Heart attack Brother 71   Allergies Sister    Aneurysm Mother        congenital   Stroke Brother        two strokes   Barrett's esophagus Brother     ROS: no fevers or chills, productive cough, hemoptysis, dysphasia, odynophagia, melena, hematochezia, dysuria, hematuria, rash, seizure activity, orthopnea, PND, pedal edema, claudication. Remaining systems are negative.  Physical Exam: Well-developed  well-nourished in no acute distress.  Skin is warm and dry.  HEENT is normal.  Neck is supple.  Chest is clear to auscultation with normal expansion.  Cardiovascular exam is regular rate and rhythm.  Abdominal exam nontender or distended. No masses palpated. Extremities show no edema. neuro grossly intact  ECG- personally reviewed  A/P  1 coronary artery disease-minimal nonobstructive coronary disease on recent catheterization.  Continue aspirin  and statin.  2 hyperlipidemia-continue statin.  3 dyspnea-this is felt secondary to pulmonary fibrosis.  Previous right and left cardiac catheterization revealed mild coronary disease and normal filling pressures.  4 history of palpitations-no recent symptoms.  Did not tolerate metoprolol  previously.  5 interstitial lung disease-managed by pulmonary.  Redell Shallow, MD    "

## 2024-02-13 ENCOUNTER — Encounter: Payer: Self-pay | Admitting: Cardiology

## 2024-02-17 ENCOUNTER — Ambulatory Visit: Admitting: Cardiology

## 2024-02-20 ENCOUNTER — Ambulatory Visit: Admitting: Nurse Practitioner

## 2024-02-20 ENCOUNTER — Ambulatory Visit: Payer: Self-pay

## 2024-02-20 VITALS — BP 120/70 | HR 70 | Temp 97.7°F | Ht 66.0 in | Wt 121.6 lb

## 2024-02-20 DIAGNOSIS — H938X3 Other specified disorders of ear, bilateral: Secondary | ICD-10-CM | POA: Diagnosis not present

## 2024-02-20 LAB — POCT INFLUENZA A/B
Influenza A, POC: NEGATIVE
Influenza B, POC: NEGATIVE

## 2024-02-20 LAB — POCT RAPID STREP A (OFFICE): Rapid Strep A Screen: NEGATIVE

## 2024-02-20 LAB — POC COVID19 BINAXNOW: SARS Coronavirus 2 Ag: NEGATIVE

## 2024-02-20 MED ORDER — FLUTICASONE PROPIONATE 50 MCG/ACT NA SUSP: 1.0000 | Freq: Every day | NASAL | 0 refills | Status: AC

## 2024-02-20 NOTE — Progress Notes (Signed)
 Elizabeth Espinoza

## 2024-02-20 NOTE — Patient Instructions (Signed)
 Call the office if you experience: - Fever - Shortness of breath - Productive cough - Extreme fatigue - Confusion - Wheezing - Symptoms last more than 10 days

## 2024-02-20 NOTE — Telephone Encounter (Signed)
 FYI Only or Action Required?: FYI only for provider: appointment scheduled on 1/29.  Patient was last seen in primary care on 07/22/2023 by Geofm Glade PARAS, MD.  Called Nurse Triage reporting Otalgia.  Symptoms began yesterday.  Interventions attempted: OTC medications: Nasal Spray.  Symptoms are: unchanged.  Triage Disposition: See Physician Within 24 Hours  Patient/caregiver understands and will follow disposition?: Yes      Message from [place] W sent at 02/20/2024  8:15 AM EST  Reason for Triage: severe pain In ears, throat and upper body.   Reason for Disposition  Earache  (Exceptions: Brief ear pain of lasting less than 60 minutes, or earache occurring during air travel.)  Answer Assessment - Initial Assessment Questions 1. LOCATION: Which ear is involved?     BIL ear  2. ONSET: When did the ear pain start?      Over night last night   3. SEVERITY: How bad is the pain?  (Scale 1-10; mild, moderate or severe)     Severe   4. URI SYMPTOMS: Do you have a runny nose or cough?     Runny nose   5. FEVER: Do you have a fever? If Yes, ask: What is your temperature, how was it measured, and when did it start?     No   6. CAUSE: Have you been swimming recently?, How often do you use Q-TIPS?, Have you had any recent air travel or scuba diving?     No   7. OTHER SYMPTOMS: Do you have any other symptoms? (e.g., decreased hearing, dizziness, headache, stiff neck, vomiting)      Dizziness yesterday-today mild dizziness noted. .      Patient called in to triage with complaints of ear pain, throat pain (not a sore throat). This has been ongoing since yesterday. The patient stated when she swallows it feel like she will choke.  For home care, the patient is using nasal spray.   Appointment scheduled for further evaluation; Patient agrees with the plan of care, and will reach out if symptoms worsen or persist.  Protocols used: [name]

## 2024-02-20 NOTE — Progress Notes (Signed)
 "  Acute Office Visit  Subjective:     Patient ID: Elizabeth Espinoza, female    DOB: 1941-01-17, 84 y.o.   MRN: [id number]  Chief Complaint  Patient presents with   Acute Visit    Pt c/o ear and throat pain which started last night. Also having nasal drainage.    HPI Patient is in today for acute visit due to c/o of ear and throat pain that started last night. Patient reports waking up with a lot of mucous in her mouth when she swallowed her ears and throat was hurting. Patient reports some chills and muscles aches, that improved this morning. Denies being around anyone sick. Denies body aches at this time.   Review of Systems  Constitutional:  Positive for chills. Negative for fever, malaise/fatigue and weight loss.  HENT:  Positive for congestion, ear pain, hearing loss, sinus pain and sore throat.   Eyes:  Negative for redness.  Respiratory:  Positive for cough and sputum production. Negative for shortness of breath and wheezing.   Cardiovascular:  Negative for chest pain and palpitations.  Gastrointestinal:  Negative for abdominal pain and vomiting.  Musculoskeletal:  Positive for myalgias. Negative for falls and joint pain.  Neurological:  Negative for dizziness, weakness and headaches.  Endo/Heme/Allergies:  Bruises/bleeds easily.        Objective:    BP 120/70   Pulse 70   Temp 97.7 F (36.5 C) (Oral)   Ht 5' 6 (1.676 m)   Wt 121 lb 9.6 oz (55.2 kg)   SpO2 96%   BMI 19.63 kg/m    Physical Exam Constitutional:      Appearance: Normal appearance.  HENT:     Head: Normocephalic.     Right Ear: A middle ear effusion is present.     Left Ear: A middle ear effusion is present.     Nose: Rhinorrhea present.     Mouth/Throat:     Mouth: Mucous membranes are moist.  Cardiovascular:     Rate and Rhythm: Normal rate and regular rhythm.  Pulmonary:     Breath sounds: Examination of the right-middle field reveals rales. Examination of the right-lower field reveals  rales. Rales present.  Musculoskeletal:     Cervical back: No tenderness.  Lymphadenopathy:     Cervical: No cervical adenopathy.  Skin:    General: Skin is warm and dry.     Findings: Bruising present.  Neurological:     Mental Status: She is alert.  Psychiatric:        Mood and Affect: Mood normal.        Behavior: Behavior normal.        Thought Content: Thought content normal.        Judgment: Judgment normal.     No results found for any visits on 02/20/24.      Assessment & Plan:   Problem List Items Addressed This Visit       Other   Congestion of both ears - Primary   New onset -Appears to be early onset of a viral infection.  -Discussed supportive treatment, staying hydrated, rest and using tylenol  if needed for fever and or body aches -Will order Flonase  for ear congestion - Offered and Declined tessalon  for cough -Advised pt. To the office if increased fever, N/V/D, or shortness of breath       Relevant Medications   fluticasone  (FLONASE ) 50 MCG/ACT nasal spray   Other Relevant Orders   POC COVID-19  POCT Influenza A/B   POCT rapid strep A    Meds ordered this encounter  Medications   fluticasone  (FLONASE ) 50 MCG/ACT nasal spray    Sig: Place 1 spray into both nostrils daily.    Dispense:  16 g    Refill:  0    Supervising Provider:   BURNS, GLADE PARAS [[id number]]    No follow-ups on file.  Elizabeth LITTIE Limes, RN   "

## 2024-02-20 NOTE — Assessment & Plan Note (Addendum)
 New onset -Appears to be early onset of a viral infection.  -Discussed supportive treatment, staying hydrated, rest and using tylenol  if needed for fever and or body aches -Will order Flonase  for ear congestion - Offered and Declined tessalon  for cough -Advised pt. To the office if increased fever, N/V/D, or shortness of breath  -Will order POCT  Covid, Influenza and Rapid strep A

## 2024-03-13 ENCOUNTER — Ambulatory Visit: Admitting: Internal Medicine

## 2024-03-13 ENCOUNTER — Encounter

## 2024-03-31 ENCOUNTER — Ambulatory Visit

## 2024-05-21 ENCOUNTER — Ambulatory Visit: Admitting: Cardiology
